# Patient Record
Sex: Female | Born: 1960 | Race: Black or African American | Hispanic: No | Smoking: Never smoker
Health system: Southern US, Community
[De-identification: ages and names within clinical notes are randomized; demographics above are authoritative.]

## PROBLEM LIST (undated history)

## (undated) DIAGNOSIS — I10 Essential (primary) hypertension: Secondary | ICD-10-CM

## (undated) DIAGNOSIS — D509 Iron deficiency anemia, unspecified: Secondary | ICD-10-CM

## (undated) DIAGNOSIS — R251 Tremor, unspecified: Secondary | ICD-10-CM

## (undated) DIAGNOSIS — Z8719 Personal history of other diseases of the digestive system: Secondary | ICD-10-CM

## (undated) DIAGNOSIS — L0291 Cutaneous abscess, unspecified: Secondary | ICD-10-CM

## (undated) DIAGNOSIS — R079 Chest pain, unspecified: Secondary | ICD-10-CM

## (undated) DIAGNOSIS — D649 Anemia, unspecified: Secondary | ICD-10-CM

## (undated) DIAGNOSIS — G4700 Insomnia, unspecified: Secondary | ICD-10-CM

## (undated) DIAGNOSIS — I639 Cerebral infarction, unspecified: Secondary | ICD-10-CM

## (undated) DIAGNOSIS — E0590 Thyrotoxicosis, unspecified without thyrotoxic crisis or storm: Secondary | ICD-10-CM

## (undated) DIAGNOSIS — M1990 Unspecified osteoarthritis, unspecified site: Secondary | ICD-10-CM

## (undated) DIAGNOSIS — F32A Depression, unspecified: Secondary | ICD-10-CM

## (undated) DIAGNOSIS — E119 Type 2 diabetes mellitus without complications: Secondary | ICD-10-CM

## (undated) DIAGNOSIS — G4730 Sleep apnea, unspecified: Secondary | ICD-10-CM

## (undated) DIAGNOSIS — J189 Pneumonia, unspecified organism: Secondary | ICD-10-CM

## (undated) DIAGNOSIS — K219 Gastro-esophageal reflux disease without esophagitis: Secondary | ICD-10-CM

## (undated) DIAGNOSIS — K259 Gastric ulcer, unspecified as acute or chronic, without hemorrhage or perforation: Secondary | ICD-10-CM

## (undated) DIAGNOSIS — K5900 Constipation, unspecified: Secondary | ICD-10-CM

## (undated) DIAGNOSIS — R1013 Epigastric pain: Secondary | ICD-10-CM

## (undated) DIAGNOSIS — Z87442 Personal history of urinary calculi: Secondary | ICD-10-CM

## (undated) DIAGNOSIS — E569 Vitamin deficiency, unspecified: Secondary | ICD-10-CM

## (undated) DIAGNOSIS — E0500 Thyrotoxicosis with diffuse goiter without thyrotoxic crisis or storm: Secondary | ICD-10-CM

## (undated) DIAGNOSIS — Z930 Tracheostomy status: Secondary | ICD-10-CM

## (undated) DIAGNOSIS — R58 Hemorrhage, not elsewhere classified: Secondary | ICD-10-CM

## (undated) DIAGNOSIS — N189 Chronic kidney disease, unspecified: Secondary | ICD-10-CM

## (undated) DIAGNOSIS — T7840XA Allergy, unspecified, initial encounter: Secondary | ICD-10-CM

## (undated) DIAGNOSIS — K253 Acute gastric ulcer without hemorrhage or perforation: Secondary | ICD-10-CM

## (undated) DIAGNOSIS — E785 Hyperlipidemia, unspecified: Secondary | ICD-10-CM

## (undated) DIAGNOSIS — G8929 Other chronic pain: Secondary | ICD-10-CM

## (undated) DIAGNOSIS — R531 Weakness: Secondary | ICD-10-CM

## (undated) DIAGNOSIS — R300 Dysuria: Secondary | ICD-10-CM

## (undated) DIAGNOSIS — G629 Polyneuropathy, unspecified: Secondary | ICD-10-CM

## (undated) DIAGNOSIS — M797 Fibromyalgia: Secondary | ICD-10-CM

## (undated) DIAGNOSIS — G4733 Obstructive sleep apnea (adult) (pediatric): Secondary | ICD-10-CM

## (undated) DIAGNOSIS — F329 Major depressive disorder, single episode, unspecified: Secondary | ICD-10-CM

## (undated) DIAGNOSIS — Z5189 Encounter for other specified aftercare: Secondary | ICD-10-CM

## (undated) DIAGNOSIS — N179 Acute kidney failure, unspecified: Secondary | ICD-10-CM

## (undated) HISTORY — PX: OOPHORECTOMY: SHX86

## (undated) HISTORY — DX: Fibromyalgia: M79.7

## (undated) HISTORY — DX: Tremor, unspecified: R25.1

## (undated) HISTORY — DX: Hyperlipidemia, unspecified: E78.5

## (undated) HISTORY — DX: Vitamin deficiency, unspecified: E56.9

## (undated) HISTORY — DX: Epigastric pain: R10.13

## (undated) HISTORY — DX: Polyneuropathy, unspecified: G62.9

## (undated) HISTORY — DX: Cerebral infarction, unspecified: I63.9

## (undated) HISTORY — PX: KNEE ARTHROSCOPY: SHX127

## (undated) HISTORY — DX: Allergy, unspecified, initial encounter: T78.40XA

## (undated) HISTORY — DX: Cutaneous abscess, unspecified: L02.91

## (undated) HISTORY — DX: Encounter for other specified aftercare: Z51.89

## (undated) HISTORY — DX: Tracheostomy status: Z93.0

## (undated) HISTORY — DX: Hemorrhage, not elsewhere classified: R58

## (undated) HISTORY — DX: Chest pain, unspecified: R07.9

## (undated) HISTORY — DX: Iron deficiency anemia, unspecified: D50.9

## (undated) HISTORY — DX: Insomnia, unspecified: G47.00

## (undated) HISTORY — DX: Personal history of other diseases of the digestive system: Z87.19

## (undated) HISTORY — DX: Gastric ulcer, unspecified as acute or chronic, without hemorrhage or perforation: K25.9

## (undated) HISTORY — PX: TUBAL LIGATION: SHX77

---
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
## 1995-07-13 HISTORY — PX: KNEE ARTHROPLASTY: SHX992

## 2010-07-12 DIAGNOSIS — Z5189 Encounter for other specified aftercare: Secondary | ICD-10-CM

## 2010-07-12 HISTORY — DX: Encounter for other specified aftercare: Z51.89

## 2011-01-25 ENCOUNTER — Emergency Department (HOSPITAL_COMMUNITY)
Admission: EM | Admit: 2011-01-25 | Discharge: 2011-01-26 | Disposition: A | Discharge status: Home / Self Care | Payer: Self-pay | Attending: Emergency Medicine | Admitting: Emergency Medicine

## 2011-01-25 ENCOUNTER — Emergency Department (HOSPITAL_COMMUNITY): Payer: Self-pay

## 2011-01-25 DIAGNOSIS — R55 Syncope and collapse: Secondary | ICD-10-CM | POA: Insufficient documentation

## 2011-01-25 DIAGNOSIS — N289 Disorder of kidney and ureter, unspecified: Secondary | ICD-10-CM | POA: Insufficient documentation

## 2011-01-25 DIAGNOSIS — R42 Dizziness and giddiness: Secondary | ICD-10-CM | POA: Insufficient documentation

## 2011-01-25 DIAGNOSIS — I10 Essential (primary) hypertension: Secondary | ICD-10-CM | POA: Insufficient documentation

## 2011-01-25 DIAGNOSIS — R0602 Shortness of breath: Secondary | ICD-10-CM | POA: Insufficient documentation

## 2011-01-25 LAB — DIFFERENTIAL
Eosinophils Absolute: 0.2 10*3/uL (ref 0.0–0.7)
Eosinophils Relative: 3 % (ref 0–5)
Lymphocytes Relative: 22 % (ref 12–46)
Lymphs Abs: 1.9 10*3/uL (ref 0.7–4.0)
Monocytes Relative: 8 % (ref 3–12)
Neutrophils Relative %: 66 % (ref 43–77)

## 2011-01-25 LAB — CBC
HCT: 39.6 % (ref 36.0–46.0)
MCH: 28.9 pg (ref 26.0–34.0)
MCV: 88.6 fL (ref 78.0–100.0)
Platelets: 357 10*3/uL (ref 150–400)
RBC: 4.47 MIL/uL (ref 3.87–5.11)

## 2011-01-25 LAB — TROPONIN I: Troponin I: <0.3 ng/mL (ref <0.30)

## 2011-01-26 LAB — BASIC METABOLIC PANEL WITH GFR
BUN: 24 mg/dL — ABNORMAL HIGH (ref 6–23)
CO2: 25 meq/L (ref 19–32)
Calcium: 9.8 mg/dL (ref 8.4–10.5)
Chloride: 102 meq/L (ref 96–112)
Creatinine, Ser: 1.26 mg/dL — ABNORMAL HIGH (ref 0.50–1.10)
GFR calc Af Amer: 55 mL/min — ABNORMAL LOW
GFR calc non Af Amer: 45 mL/min — ABNORMAL LOW
Glucose, Bld: 103 mg/dL — ABNORMAL HIGH (ref 70–99)
Potassium: 3.2 meq/L — ABNORMAL LOW (ref 3.5–5.1)
Sodium: 140 meq/L (ref 135–145)

## 2011-01-26 LAB — URINALYSIS, ROUTINE W REFLEX MICROSCOPIC
Glucose, UA: NEGATIVE mg/dL
Ketones, ur: NEGATIVE mg/dL
Leukocytes, UA: NEGATIVE
Nitrite: NEGATIVE
Protein, ur: NEGATIVE mg/dL
Specific Gravity, Urine: 1.025 (ref 1.005–1.030)
Urobilinogen, UA: 0.2 mg/dL (ref 0.0–1.0)
pH: 5.5 (ref 5.0–8.0)

## 2011-01-26 LAB — URINE MICROSCOPIC-ADD ON

## 2011-01-26 LAB — D-DIMER, QUANTITATIVE: D-Dimer, Quant: <0.22 ug/mL-FEU (ref 0.00–0.48)

## 2011-01-27 LAB — URINE CULTURE

## 2011-11-18 ENCOUNTER — Other Ambulatory Visit: Payer: Self-pay | Admitting: Family Medicine

## 2011-11-18 DIAGNOSIS — Z1231 Encounter for screening mammogram for malignant neoplasm of breast: Secondary | ICD-10-CM

## 2011-12-01 ENCOUNTER — Ambulatory Visit: Payer: Self-pay

## 2011-12-07 ENCOUNTER — Ambulatory Visit
Admission: RE | Admit: 2011-12-07 | Discharge: 2011-12-07 | Disposition: A | Discharge status: Home / Self Care | Payer: Medicaid Other | Source: Ambulatory Visit | Attending: Family Medicine | Admitting: Family Medicine

## 2011-12-07 DIAGNOSIS — Z1231 Encounter for screening mammogram for malignant neoplasm of breast: Secondary | ICD-10-CM

## 2013-01-14 ENCOUNTER — Emergency Department (HOSPITAL_COMMUNITY): Payer: Medicaid Other

## 2013-01-14 ENCOUNTER — Inpatient Hospital Stay (HOSPITAL_COMMUNITY)
Admission: EM | Admit: 2013-01-14 | Discharge: 2013-01-16 | DRG: 065 | Disposition: A | Discharge status: Home / Self Care | Payer: Medicaid Other | Attending: Family Medicine | Admitting: Family Medicine

## 2013-01-14 ENCOUNTER — Inpatient Hospital Stay (HOSPITAL_COMMUNITY): Payer: Medicaid Other

## 2013-01-14 ENCOUNTER — Encounter (HOSPITAL_COMMUNITY): Payer: Self-pay | Admitting: Emergency Medicine

## 2013-01-14 DIAGNOSIS — R4701 Aphasia: Secondary | ICD-10-CM

## 2013-01-14 DIAGNOSIS — G459 Transient cerebral ischemic attack, unspecified: Secondary | ICD-10-CM

## 2013-01-14 DIAGNOSIS — I152 Hypertension secondary to endocrine disorders: Secondary | ICD-10-CM | POA: Diagnosis present

## 2013-01-14 DIAGNOSIS — Z6841 Body Mass Index (BMI) 40.0 and over, adult: Secondary | ICD-10-CM

## 2013-01-14 DIAGNOSIS — I639 Cerebral infarction, unspecified: Secondary | ICD-10-CM | POA: Diagnosis present

## 2013-01-14 DIAGNOSIS — I6350 Cerebral infarction due to unspecified occlusion or stenosis of unspecified cerebral artery: Principal | ICD-10-CM | POA: Diagnosis present

## 2013-01-14 DIAGNOSIS — Z7982 Long term (current) use of aspirin: Secondary | ICD-10-CM

## 2013-01-14 DIAGNOSIS — D649 Anemia, unspecified: Secondary | ICD-10-CM | POA: Diagnosis present

## 2013-01-14 DIAGNOSIS — I6529 Occlusion and stenosis of unspecified carotid artery: Secondary | ICD-10-CM | POA: Diagnosis present

## 2013-01-14 DIAGNOSIS — R269 Unspecified abnormalities of gait and mobility: Secondary | ICD-10-CM

## 2013-01-14 DIAGNOSIS — R279 Unspecified lack of coordination: Secondary | ICD-10-CM | POA: Diagnosis present

## 2013-01-14 DIAGNOSIS — I69923 Fluency disorder following unspecified cerebrovascular disease: Secondary | ICD-10-CM

## 2013-01-14 DIAGNOSIS — R29818 Other symptoms and signs involving the nervous system: Secondary | ICD-10-CM

## 2013-01-14 DIAGNOSIS — R2990 Unspecified symptoms and signs involving the nervous system: Secondary | ICD-10-CM

## 2013-01-14 DIAGNOSIS — J45909 Unspecified asthma, uncomplicated: Secondary | ICD-10-CM | POA: Diagnosis present

## 2013-01-14 DIAGNOSIS — R29898 Other symptoms and signs involving the musculoskeletal system: Secondary | ICD-10-CM | POA: Diagnosis present

## 2013-01-14 DIAGNOSIS — I10 Essential (primary) hypertension: Secondary | ICD-10-CM | POA: Diagnosis present

## 2013-01-14 DIAGNOSIS — F449 Dissociative and conversion disorder, unspecified: Secondary | ICD-10-CM | POA: Diagnosis present

## 2013-01-14 DIAGNOSIS — IMO0002 Reserved for concepts with insufficient information to code with codable children: Secondary | ICD-10-CM

## 2013-01-14 DIAGNOSIS — G8190 Hemiplegia, unspecified affecting unspecified side: Secondary | ICD-10-CM | POA: Diagnosis present

## 2013-01-14 HISTORY — DX: Essential (primary) hypertension: I10

## 2013-01-14 HISTORY — DX: Cerebral infarction, unspecified: I63.9

## 2013-01-14 LAB — POCT I-STAT, CHEM 8
Chloride: 100 mEq/L (ref 96–112)
Creatinine, Ser: 0.9 mg/dL (ref 0.50–1.10)
Glucose, Bld: 145 mg/dL — ABNORMAL HIGH (ref 70–99)
HCT: 42 % (ref 36.0–46.0)
Potassium: 3.5 mEq/L (ref 3.5–5.1)

## 2013-01-14 LAB — COMPREHENSIVE METABOLIC PANEL
CO2: 30 mEq/L (ref 19–32)
Calcium: 9.3 mg/dL (ref 8.4–10.5)
Creatinine, Ser: 0.84 mg/dL (ref 0.50–1.10)
GFR calc Af Amer: >90 mL/min (ref >90)
GFR calc non Af Amer: 79 mL/min — ABNORMAL LOW (ref >90)
Glucose, Bld: 142 mg/dL — ABNORMAL HIGH (ref 70–99)

## 2013-01-14 LAB — CBC
HCT: 39.6 % (ref 36.0–46.0)
HCT: 37 % (ref 36.0–46.0)
HCT: 38.7 % (ref 36.0–46.0)
Hemoglobin: 12.4 g/dL (ref 12.0–15.0)
MCH: 28.6 pg (ref 26.0–34.0)
MCH: 28.5 pg (ref 26.0–34.0)
MCHC: 33.5 g/dL (ref 30.0–36.0)
MCV: 86.5 fL (ref 78.0–100.0)
MCV: 85.5 fL (ref 78.0–100.0)
MCV: 86.2 fL (ref 78.0–100.0)
RBC: 4.49 MIL/uL (ref 3.87–5.11)
RDW: 14.1 % (ref 11.5–15.5)
WBC: 9.1 10*3/uL (ref 4.0–10.5)
WBC: 7.6 10*3/uL (ref 4.0–10.5)

## 2013-01-14 LAB — RAPID URINE DRUG SCREEN, HOSP PERFORMED
Cocaine: NOT DETECTED
Opiates: POSITIVE — AB

## 2013-01-14 LAB — LIPID PANEL
Cholesterol: 224 mg/dL — ABNORMAL HIGH (ref 0–200)
Triglycerides: 133 mg/dL (ref <150)

## 2013-01-14 LAB — URINALYSIS, ROUTINE W REFLEX MICROSCOPIC
Bilirubin Urine: NEGATIVE
Specific Gravity, Urine: 1.011 (ref 1.005–1.030)
pH: 6.5 (ref 5.0–8.0)

## 2013-01-14 LAB — DIFFERENTIAL
Basophils Absolute: 0 10*3/uL (ref 0.0–0.1)
Eosinophils Relative: 1 % (ref 0–5)
Lymphocytes Relative: 21 % (ref 12–46)
Lymphs Abs: 1.9 10*3/uL (ref 0.7–4.0)
Monocytes Absolute: 0.5 10*3/uL (ref 0.1–1.0)

## 2013-01-14 LAB — TROPONIN I
Troponin I: <0.3 ng/mL (ref <0.30)
Troponin I: <0.3 ng/mL (ref <0.30)

## 2013-01-14 LAB — GLUCOSE, CAPILLARY
Glucose-Capillary: 129 mg/dL — ABNORMAL HIGH (ref 70–99)
Glucose-Capillary: 126 mg/dL — ABNORMAL HIGH (ref 70–99)
Glucose-Capillary: 119 mg/dL — ABNORMAL HIGH (ref 70–99)

## 2013-01-14 LAB — HEMOGLOBIN A1C
Hgb A1c MFr Bld: 5.9 % — ABNORMAL HIGH (ref <5.7)
Mean Plasma Glucose: 123 mg/dL — ABNORMAL HIGH (ref <117)

## 2013-01-14 LAB — URINE MICROSCOPIC-ADD ON

## 2013-01-14 LAB — CREATININE, SERUM: GFR calc non Af Amer: 77 mL/min — ABNORMAL LOW (ref >90)

## 2013-01-14 MED ORDER — SENNOSIDES-DOCUSATE SODIUM 8.6-50 MG PO TABS
1.0000 | ORAL_TABLET | Freq: Every evening | ORAL | Status: DC | PRN
  Administered 2013-01-14: 1 via ORAL
  Filled 2013-01-14: qty 1

## 2013-01-14 MED ORDER — ACETAMINOPHEN 325 MG PO TABS
650.0000 mg | ORAL_TABLET | ORAL | Status: DC | PRN
  Administered 2013-01-14 – 2013-01-16 (×5): 650 mg via ORAL
  Filled 2013-01-14 (×5): qty 2

## 2013-01-14 MED ORDER — ATORVASTATIN CALCIUM 20 MG PO TABS
20.0000 mg | ORAL_TABLET | Freq: Every day | ORAL | Status: DC
  Administered 2013-01-14 – 2013-01-16 (×3): 20 mg via ORAL
  Filled 2013-01-14 (×3): qty 1

## 2013-01-14 MED ORDER — SODIUM CHLORIDE 0.9 % IV SOLN
INTRAVENOUS | Status: DC
  Administered 2013-01-14 (×2): via INTRAVENOUS

## 2013-01-14 MED ORDER — ASPIRIN 300 MG RE SUPP
300.0000 mg | Freq: Every day | RECTAL | Status: DC
  Filled 2013-01-14 (×3): qty 1

## 2013-01-14 MED ORDER — GADOBENATE DIMEGLUMINE 529 MG/ML IV SOLN
20.0000 mL | Freq: Once | INTRAVENOUS | Status: AC | PRN
  Administered 2013-01-14: 20 mL via INTRAVENOUS

## 2013-01-14 MED ORDER — HEPARIN SODIUM (PORCINE) 5000 UNIT/ML IJ SOLN
5000.0000 [IU] | Freq: Three times a day (TID) | INTRAMUSCULAR | Status: DC
  Administered 2013-01-14 – 2013-01-16 (×7): 5000 [IU] via SUBCUTANEOUS
  Filled 2013-01-14 (×10): qty 1

## 2013-01-14 MED ORDER — ACETAMINOPHEN 650 MG RE SUPP: 650.0000 mg | RECTAL | Status: DC | PRN

## 2013-01-14 MED ORDER — ASPIRIN 325 MG PO TABS
325.0000 mg | ORAL_TABLET | Freq: Every day | ORAL | Status: DC
  Administered 2013-01-14 – 2013-01-16 (×3): 325 mg via ORAL
  Filled 2013-01-14 (×3): qty 1

## 2013-01-14 MED ORDER — HYDRALAZINE HCL 20 MG/ML IJ SOLN: 5.0000 mg | INTRAMUSCULAR | Status: DC | PRN

## 2013-01-14 NOTE — ED Notes (Signed)
Pt reports falling out of bed 4 times today.

## 2013-01-14 NOTE — ED Provider Notes (Signed)
History    CSN: [id number] Arrival date & time 01/14/13  0059  First MD Initiated Contact with Patient 01/14/13 0105     Chief Complaint  Patient presents with  . Aphasia   (Consider location/radiation/quality/duration/timing/severity/associated sxs/prior Treatment) HPI Comments: The patient is a 52 year old female who admits to a history of hypertension and a recent visit to her family doctor because of right ankle pain. She was started on hydrocodone and shortly after taking this medication develop difficulty speaking, weakness on the right side of her body and has had several falls. This seems to be intermittent, it comes and goes but has been present for a good portion of today. She had fallen out of bed, had to crawl to the bathroom stating that she felt like her body was not connected to her brain and she was unable to do the actions that she wanted to perform. She denies fevers or chills, coughing or shortness of breath, abdominal pain, back pain, nausea or vomiting. She has not had a hydrocodone and greater than 24 hours  The history is provided by the patient and the EMS personnel.   Past Medical History  Diagnosis Date  . Hypertension    History reviewed. No pertinent past surgical history. No family history on file. History  Substance Use Topics  . Smoking status: Not on file  . Smokeless tobacco: Not on file  . Alcohol Use: Not on file   OB History   Grav Para Term Preterm Abortions TAB SAB Ect Mult Living                 Review of Systems  Neurological: Positive for headaches.  All other systems reviewed and are negative.    Allergies  Morphine and related  Home Medications  No current outpatient prescriptions on file. BP 120/84  Pulse 98  Temp(Src) 98 F (36.7 C) (Oral)  Resp 20  SpO2 97% Physical Exam  Nursing note and vitals reviewed. Constitutional: She appears well-developed and well-nourished. No distress.  HENT:  Head: Normocephalic and  atraumatic.  Mouth/Throat: Oropharynx is clear and moist. No oropharyngeal exudate.  Eyes: Conjunctivae and EOM are normal. Pupils are equal, round, and reactive to light. Right eye exhibits no discharge. Left eye exhibits no discharge. No scleral icterus.  Neck: Normal range of motion. Neck supple. No JVD present. No thyromegaly present.  Cardiovascular: Normal rate, regular rhythm, normal heart sounds and intact distal pulses.  Exam reveals no gallop and no friction rub.   No murmur heard. No JVD, no carotid bruits  Pulmonary/Chest: Effort normal and breath sounds normal. No respiratory distress. She has no wheezes. She has no rales.  Abdominal: Soft. Bowel sounds are normal. She exhibits no distension and no mass. There is no tenderness.  Musculoskeletal: Normal range of motion. She exhibits no edema and no tenderness.  Lymphadenopathy:    She has no cervical adenopathy.  Neurological: She is alert.  Some discoordination of the upper extremities, extreme difficulty sitting up in bed, speech is stuttering and has some mild expressive aphasia which seems to come and go throughout the exam, cranial nerves III through XII are intact when tested, patient has a slight left facial droop when talking but does not appear when uses her facial muscles to command. She has slight weakness of the right upper and right lower extremities, slight sensory deficit to the right lower extremity but not the right upper extremity to light touch. Normal reflexes at the patellar tendons bilaterally, memory  is intact, alert and oriented x3  Skin: Skin is warm and dry. No rash noted. No erythema.  Psychiatric: She has a normal mood and affect. Her behavior is normal.    ED Course  Procedures (including critical care time) Labs Reviewed  COMPREHENSIVE METABOLIC PANEL - Abnormal; Notable for the following:    Glucose, Bld 142 (*)    GFR calc non Af Amer 79 (*)    All other components within normal limits  GLUCOSE,  CAPILLARY - Abnormal; Notable for the following:    Glucose-Capillary 129 (*)    All other components within normal limits  POCT I-STAT, CHEM 8 - Abnormal; Notable for the following:    Glucose, Bld 145 (*)    All other components within normal limits  ETHANOL  PROTIME-INR  APTT  CBC  DIFFERENTIAL  TROPONIN I  URINE RAPID DRUG SCREEN (HOSP PERFORMED)  URINALYSIS, ROUTINE W REFLEX MICROSCOPIC  POCT I-STAT TROPONIN I   Ct Head Wo Contrast  01/14/2013   *RADIOLOGY REPORT*  Clinical Data: Left sided headache, dizziness, blurred vision, aphasia.  CT HEAD WITHOUT CONTRAST  Technique:  Contiguous axial images were obtained from the base of the skull through the vertex without contrast.  Comparison: None.  Findings: There is a vague focal low attenuation change in the subcortical white matter of the left posterior parietal/occipital region.  This could represent small vessel ischemia versus early changes of acute infarct.  There is no mass effect or midline shift.  No ventricular dilatation.  No acute intracranial hemorrhage.  Gray-white matter junctions are distinct.  Basal cisterns are not effaced.  No abnormal extra-axial fluid collections.  No depressed skull fractures.  Visualized paranasal sinuses and mastoid air cells are not opacified.  IMPRESSION: Vague low attenuation change in the subcortical white matter of the left posterior parietal/occipital region may represent early changes of acute infarct versus small vessel ischemic change.  No significant mass effect.  No acute intracranial hemorrhage.   Original Report Authenticated By: Burman Nieves, M.D.   1. Aphasia   2.  possible stroke  MDM  The patient has focal neurologic deficits, I do not know that these are necessarily related to the hydrocodone especially since she has not had this medication greater than 24 hours. She does have generalized body pain and is not more specific about this on history but does note that she has focal  right-sided weakness and difficulty speaking intermittently for the last 2-3 days. At this time her symptoms have been present waxing and waning for longer than 12 hours, she is not accurate in describing the most recent onset of these symptoms thus at this time I cannot definitively say that she is within a stroke window and a code stroke will not be activated. Stroke workup will begin.  ED ECG REPORT  I personally interpreted this EKG   Date: 01/14/2013   Rate: 96  Rhythm: normal sinus rhythm  QRS Axis: normal  Intervals: QT prolonged  ST/T Wave abnormalities: nonspecific T wave changes  Conduction Disutrbances:none  Narrative Interpretation: Left ventricular hypertrophy  Old EKG Reviewed: Compared with 01/25/2011, no significant change  The care was discussed with the resident physician on-call for family practice as the patient is unassigned. CT scan shows possible stroke, this might be consistent with the patient's symptoms, they will admit to the hospital.  Vida Roller, MD 01/14/13 [phone]

## 2013-01-14 NOTE — H&P (Signed)
Family Medicine Teaching Service Attending Note  I interviewed and examined patient Yesenia [name] and reviewed their tests and x-rays.  I discussed with Dr. Claiborne Billings and reviewed their note for today.  I agree with their assessment and plan.     Additionally  Sleepy but easily arousable Oriented x 3 Complains of dizziness with sitting - room spinning Complains of right sided weakness and clumsiness for several weeks Had small stroke years ago attributed to celebrex CN 2-7 intact Able to swallow water but feels it sticks  Str 4/5 on right upper and lower 5/5 on left PERRL EOMI Finger to nose intact bilateral left slower than right   History and neuro exam findings not consistent with CT findings.- Obtain MRI and Neuro consult CVA work up [employer] study

## 2013-01-14 NOTE — H&P (Signed)
Family Medicine Teaching [facility] Admission History and Physical Service Pager: [phone]  Patient name: Yesenia [name] Medical record number: [id number] Date of birth: [birth year] Age: 52 y.o. Gender: female  Primary Care Provider: Egbert Garibaldi, NP Consultants: None Code Status: Full  Chief Complaint: Dizziness and ataxia  Assessment and Plan: Yesenia [name] is a 52 y.o. year old female presenting with acute ischemic stroke . PMH is significant for HTN and anemia. Currently on telemetry, with a CT positive for evidence of early acute ischemic infarct vs small vessel ischemic change  # Acute ischemic infarct - Right sided weakness in arms and legs. CN11 deficit on right. No other cranial nerve defecits. Positive findings on CT head (7/6) suggestive of acute ischemic injury to parietal/occiptal region. History of HTN and frequent episodes of dizziness suggesting possible uncontrolled status. Recently seen at PCP (Dr. Aileen [name]) with medication changes, patient uncertain of doses and when her last dose was taken. Possibly drop BP too low with medication changes or medication misuse. UDS positive for benzodiazepines, patient did not admit to this medication.  -currently NPO until swallow evaluation complete -monitor signs and symptoms for changes in neurological function -monitor CBGs for glucose control - Patient started on ASA 325 mg daily and Bowel stimulant - NS 125 ml/hr while NPO - Foley cath placed for safety and bedrest status.  [ ]  Carotid Doppler ordered; Considering MRA/MRI in the morning.  [ ]  Consult Neurology in the morning.  [ ]  Risk stratification labs ordered [ ]  Repeat CBC and CMET this afternoon   # HTN - BPs have been stable in ED -hold home BP meds for now.  - Monitor closely; hydralazine PRN orders for BP > 200/110; allowing for permissive HTN during acute phase stroke.   # Anemia - history of anemia with frequent episodes of dizziness. Patient reports  transfusion d/t to anemia a few years ago. She does not take any medications or supplementation for anemia and her H/H was stable on admission.  -monitor CBC  FEN/GI: NPO Prophylaxis: Heparin subq  Disposition: Admit to telemetry  History of Present Illness: Yesenia [name] is a 52 y.o. year old female with a history of hypertension and chronic fatigue with frequent episodes of dizziness, that presented to the ED with right sided weakness that developed on Thursday. Yesterday, she developed trouble walking and fell down four times. She admits to hitting her head once. Before she fell, she felt like her balance is "off" and her legs "weren't there" She has a long history of feeling dizzy when walking into grocery stores and having to use a grocery cart to catch herself as she would feel the area spinning. She does not have a history of stuttering, however,  Yesenia [name] stated that her daughter mentioned she had developed a stutter while she was on the phone on Thursday. Yesenia [name] was not aware of a stutter prior. She has been confused about the past few days and is getting her days mixed up. She came to the hospital by ambulance from home after her boyfriend came over and saw she couldn't walk to the bathroom. At the time, she stated her legs felt weak and unattached, with the right leg worse than the left leg. Her right arm feels heavy versus her left hand. She has been drinking a lot of water for the past week and feels thirsty a lot. She has had no headache, diarrhea or constipation. She has had a recent changes in vision, she has had  SOB (3 months "Bronchitis"), chest pain and polyuria, no dysuria, with normal voids. She reports her chest pain occured Thursday or Friday, did not have radiation,  and went away on its own. She last ate on Thursday that she recalls, but seems confused on this matter. She has a Daughter Proofreader) and Son [place]), but neither have a car. She reports Dr. Aileen [name] gave her a  hearing screen but she only scored a "40."  She reports a FH of  no stroke, but positive for HTN and  brother died of leukemia, sister of cancer. She is uncertain of her father's history, he is unknown to her. She denies smoking history, and admits to an occasional occasional drink. Hospitalized in 2007 because she kept passing out said due to HTN and anemia. LMP 7 years ago. Yesenia [name] moved to [place] from [place] in 2012.  Review Of Systems: Per HPI with the following additions: Otherwise 12 point review of systems was performed and was unremarkable.  There are no active problems to display for this patient.  Past Medical History: Past Medical History  Diagnosis Date  . Hypertension   Hospitalizations: 2007 - Passing out   Past Surgical History: Past Surgical History  Procedure Laterality Date  . Leg surgery Right 2007    Surgery x3   Social History: History  Substance Use Topics  . Smoking status: Not on file  . Smokeless tobacco: Not on file  . Alcohol Use: Not on file   Additional social history: She is originally from [facility] and moved to [place] in 2012. She currently lives alone.  Please also refer to relevant sections of EMR.  Family History: Family History  Problem Relation Age of Onset  . Leukemia Brother    Allergies and Medications: Allergies  Allergen Reactions  . Morphine And Related    Home Medications: Hydrochlorthiazide, Amlodipine, Vicodin (Uncertian of medications d/t poor historian and her boyfriend took her medications home prior to staff being able to record them). Objective: BP 143/88  Pulse 82  Temp(Src) 98 F (36.7 C) (Oral)  Resp 14  SpO2 100% Exam: General: Obese, laying in bed, in no acute distress HEENT: Head is atraumatic, no lacerations visualized and no abnormalities palpated. TMs normal, pupils equal, round and reactive, no icterus, injections, or drainage. EOMi. Moist mucous membranes. Nose is patent without deformity or  drainage. Throat is without erythema or exudates. Cardiovascular: RRR, no murmur, rub or gallop,pulses equal bilaterally UE/LE Respiratory: clear to auscultation bilaterally, no wheeze, rhonchi or rales Abdomen: Soft, nontender. Bowel sounds present. Obese Extremities: no deformities Skin: Old scar from lower mandible to upper right side of chest. Multiple surgical scars on right leg Neuro: Alert, Oriented to person and place. Confused on day and date (july 4th).  CN 2-10,12 intact. CN11, pt not able to perform shoulder shrug on right compared to the left. Grip strength 5 on left compared to  2 on right. Arm pull and pull strength, 5 on left and 3 on right. No pronator drift. Hip extension 5 on left, 1 on right. Dorsiflexion and plantarflexion 5 on left, 4 on right.Patient unable to stand on her own, with great assistance x3 people, was able to get her standing with poor balance. Unable to perform gait assessment.   Labs and Imaging: CBC BMET   Recent Labs Lab 01/14/13 0132 01/14/13 0136  WBC 9.1  --   HGB 13.1 14.3  HCT 39.6 42.0  PLT 344  --  Recent Labs Lab 01/14/13 0132 01/14/13 0136  NA 138 141  K 3.5 3.5  CL 99 100  CO2 30  --   BUN 10 9  CREATININE 0.84 0.90  GLUCOSE 142* 145*  CALCIUM 9.3  --      CT Head w/o contrast  IMPRESSION:  Vague low attenuation change in the subcortical white matter of the  left posterior parietal/occipital region may represent early  changes of acute infarct versus small vessel ischemic change. No  significant mass effect. No acute intracranial hemorrhage.   Jacquelin Hawking, MD 01/14/2013, 4:35 AM PGY-1, [facility] FPTS Intern pager: [phone], text pages welcome  I have read and made changes where necessary, in blue, to Interns original note.  Felix Pacini, DO PGY-2

## 2013-01-14 NOTE — Evaluation (Signed)
Clinical/Bedside Swallow Evaluation Patient Details  Name: Yesenia [name] MRN: [id number] Date of Birth: 1960-09-13  Today's Date: 01/14/2013 Time: 7829-5621 SLP Time Calculation (min): 20 min  Past Medical History:  Past Medical History  Diagnosis Date  . Hypertension    Past Surgical History:  Past Surgical History  Procedure Laterality Date  . Leg surgery Right 2007    Surgery x3   HPI:  52 y.o. female with gradually progressing right hemiparesis associated with decreased sensation over the past 2 months. Frequent dizziness and loss of balance with multiple recent falls. The patient was on no antithrombotic  medication prior to admission. No acute infarct by MRI. The etiology of the patient's symptoms are unclear at this time.    Assessment / Plan / Recommendation Clinical Impression  Pt presented with normal oropharyngeal swallow function; produced constant vegetative voicing throughout PO consumption; appeared uncomfortable but could not specify information.  No overt s/s of aspiration. Recommend resuming carb modified diet with thin liquids.  No SLP f/u for swallowing; will return next date for speech/language eval per orders.  (Pt with inconsistent dysarthria/dysfluency throughout session.)    Aspiration Risk  Mild    Diet Recommendation Regular;Thin liquid   Liquid Administration via: Cup;Straw Medication Administration: Whole meds with liquid Supervision: Patient able to self feed    Other  Recommendations Oral Care Recommendations: Oral care BID             SLP Swallow Goalsn./a     Swallow Study Prior Functional Status       General Date of Onset: 01/14/13   Type of Study: Bedside swallow evaluation Diet Prior to this Study: NPO Temperature Spikes Noted: No Respiratory Status: Room air Behavior/Cognition: Alert Oral Cavity - Dentition: Adequate natural dentition (states no dental work; lower teeth covered by [employer]) Self-Feeding Abilities: Able  to feed self Patient Positioning: Upright in bed Baseline Vocal Quality: Clear Volitional Cough: Strong Volitional Swallow: Able to elicit    Oral/Motor/Sensory Function Overall Oral Motor/Sensory Function: Other (comment) (Mild asymmetry CN VII lower right)   Ice Chips Ice chips: Within functional limits Presentation: Spoon   Thin Liquid Thin Liquid: Within functional limits Presentation: Cup;Straw    Nectar Thick Nectar Thick Liquid: Not tested   Honey Thick Honey Thick Liquid: Not tested   Puree Puree: Within functional limits Presentation: Self Fed   Solid       Solid: Within functional limits Presentation: Self Fed      Yesenia [name] L. Samson Frederic, [place] CCC/SLP Pager [phone]   Blenda Mounts Laurice 01/14/2013,4:12 PM

## 2013-01-14 NOTE — ED Notes (Signed)
Pt and family reports intermittent period of Slurred speech since Thursday. Patient report constant generalized pain x2 weeks. Some mild weakness in the right hand. Pt alert to voice at this time. Some lethargy assessed. Seen Thursday at PCP for leg pain, prescribed Norco. NAD at this time.

## 2013-01-14 NOTE — ED Notes (Signed)
CBG 105 

## 2013-01-14 NOTE — Consult Note (Signed)
Referring Physician: Dr. Deirdre Priest (family medicine teaching service)    Chief Complaint: Progressive right-sided weakness  HPI: Yesenia [name] is a 52 y.o. female who moved to [place] from [place] in 2012. The patient was admitted to [facility] today by the family medicine teaching service for evaluation of dizziness and ataxia. There was a question of an acute ischemic infarct. The patient reported to me today that she has had increasing right-sided weakness over the past 2 months. She says the symptoms started in her shoulder and progressed to her entire right arm and right leg. She is also noted decreased sensation on that side. Her daughter noted that the patient had been stuttering recently. This is not normal for the patient; although, she did tell me that she had a similar episode of stuttering while living in [place]. At that time she was admitted to the hospital and found to be severely anemic. She was transfused and apparently her symptoms resolved. She has a long history of dizziness and recently her balance has been very poor. Yesterday she fell 4 times and at one point she struck her head. She tells me that she was not on aspirin prior to admission. We are still waiting for a complete list of the patient's medications.  Date last known well: 2 months ago Time last known well: Unknown tPA Given: No TPA secondary to late presentation.  Past Medical History  Diagnosis Date  . Hypertension     Past Surgical History  Procedure Laterality Date  . Leg surgery Right 2007    Surgery x3    Family History  Problem Relation Age of Onset  . Leukemia Brother    Social History:  has no tobacco, alcohol, and drug history on file. The patient currently lives in [place]. She is divorced. She has 2 children. She is on disability.  Allergies:  Allergies  Allergen Reactions  . Morphine And Related     Medications:  Current Facility-Administered Medications  Medication  Dose Route Frequency Provider Last Rate Last Dose  . 0.9 %  sodium chloride infusion   Intravenous Continuous Renee A Kuneff, DO 150 mL/hr at 01/14/13 1049    . acetaminophen (TYLENOL) tablet 650 mg  650 mg Oral Q4H PRN Jacquelin Hawking, MD       Or  . acetaminophen (TYLENOL) suppository 650 mg  650 mg Rectal Q4H PRN Jacquelin Hawking, MD      . aspirin suppository 300 mg  300 mg Rectal Daily Jacquelin Hawking, MD       Or  . aspirin tablet 325 mg  325 mg Oral Daily Jacquelin Hawking, MD   325 mg at 01/14/13 1037  . heparin injection 5,000 Units  5,000 Units Subcutaneous Q8H Jacquelin Hawking, MD   5,000 Units at 01/14/13 1344  . hydrALAZINE (APRESOLINE) injection 5 mg  5 mg Intravenous Q4H PRN Jacquelin Hawking, MD      . senna-docusate (Senokot-S) tablet 1 tablet  1 tablet Oral QHS PRN Jacquelin Hawking, MD       Scheduled: . aspirin  300 mg Rectal Daily   Or  . aspirin  325 mg Oral Daily  . heparin  5,000 Units Subcutaneous Q8H    ROS: History obtained from the patient  General ROS: Positive for recent weight gain. Psychological ROS: Positive for depression Ophthalmic ROS: Positive for visual changes described as a film over her eyes ENT ROS: negative for - frequent dizziness and loss of balance Allergy and Immunology ROS: negative  for - hives or itchy/watery eyes Hematological and Lymphatic ROS: negative for - bleeding problems, bruising or swollen lymph nodes Endocrine ROS: negative for - galactorrhea, hair pattern changes, polydipsia/polyuria or temperature intolerance Respiratory ROS: negative for - positive for wheezing and shortness of breath Cardiovascular ROS: negative for - positive for intermittent chest pain Gastrointestinal ROS: negative for - positive for bilateral abdominal pain often related to movements. Genito-Urinary ROS: negative for - dysuria, hematuria, incontinence or urinary frequency/urgency Musculoskeletal ROS: negative for -positive for right knee and right ankle arthritis Neurological  ROS: as noted in HPI Dermatological ROS: Scarring of the right neck and face  Physical Examination: Blood pressure 118/84, pulse 94, temperature 98.2 F (36.8 C), temperature source Oral, resp. rate 20, height 5\' 6"  (1.676 m), weight 150.1 kg (330 lb 14.6 oz), SpO2 98.00%.   General - 52 year old morbidly obese female in no acute distress Heart - Regular rate and rhythm - no murmer Lungs - Clear to auscultation Abdomen - Soft - non tender Extremities - Distal pulses intact - trace edema Skin - Warm and dry   Neurologic Examination: Mental Status: Alert, oriented, thought content appropriate.  Frequent stuttering.  Able to follow 3 step commands without difficulty. Cranial Nerves: II: Discs not visualized; Visual fields grossly normal, pupils equal, round, reactive to light and accommodation III,IV, VI: ptosis not present, extra-ocular motions intact bilaterally V,VII: Mild right facial droop and decreased sensation on the right side of her face VIII: hearing normal bilaterally IX,X: gag reflex present XI: bilateral shoulder shrug XII: midline tongue extension Motor: Right : Upper extremity   4/5    Left:     Upper extremity   5/5  Lower extremity  3- 4/5     Lower extremity   5/5 Tone and bulk:normal tone throughout; no atrophy noted Sensory: Sensation decreased to light touch in the right upper and lower extremities as well as the face Deep Tendon Reflexes: 2+ and symmetric throughout except 1+ in the lower extremities Plantars: Right: downgoing   Left: downgoing Cerebellar: normal finger-to-nose, normal rapid alternating movements Gait: Deferred CV: pulses palpable throughout   Laboratory Studies:  Basic Metabolic Panel:  Recent Labs Lab 01/14/13 0132 01/14/13 0136 01/14/13 0714  NA 138 141  --   K 3.5 3.5  --   CL 99 100  --   CO2 30  --   --   GLUCOSE 142* 145*  --   BUN 10 9  --   CREATININE 0.84 0.90 0.86  CALCIUM 9.3  --   --     Liver Function  Tests:  Recent Labs Lab 01/14/13 0132  AST 21  ALT 20  ALKPHOS 111  BILITOT 0.3  PROT 8.0  ALBUMIN 3.8   No results found for this basename: LIPASE, AMYLASE,  in the last 168 hours No results found for this basename: AMMONIA,  in the last 168 hours  CBC:  Recent Labs Lab 01/14/13 0132 01/14/13 0136 01/14/13 0714 01/14/13 1250  WBC 9.1  --  8.1 7.6  NEUTROABS 6.6  --   --   --   HGB 13.1 14.3 12.4 12.8  HCT 39.6 42.0 37.0 38.7  MCV 86.5  --  85.5 86.2  PLT 344  --  300 307    Cardiac Enzymes:  Recent Labs Lab 01/14/13 0132 01/14/13 0713  TROPONINI <0.30 <0.30    BNP: No components found with this basename: POCBNP,   CBG:  Recent Labs Lab 01/14/13 0208 01/14/13 0944 01/14/13  1226  GLUCAP 129* 126* 119*    Microbiology: Results for orders placed during the hospital encounter of 01/25/11  URINE CULTURE     Status: None   Collection Time    01/26/11  2:00 AM      Result Value Range Status   Specimen Description URINE, CLEAN CATCH   Final   Special Requests NONE   Final   Culture  Setup Time [id number]   Final   Colony Count 25,000 COLONIES/ML   Final   Culture     Final   Value: Multiple bacterial morphotypes present, none predominant. Suggest appropriate recollection if clinically indicated.   Report Status 01/27/2011 FINAL   Final    Coagulation Studies:  Recent Labs  01/14/13 0132  LABPROT 14.2  INR 1.12    Urinalysis:  Recent Labs Lab 01/14/13 0404  COLORURINE YELLOW  LABSPEC 1.011  PHURINE 6.5  GLUCOSEU NEGATIVE  HGBUR TRACE*  BILIRUBINUR NEGATIVE  KETONESUR NEGATIVE  PROTEINUR NEGATIVE  UROBILINOGEN 0.2  NITRITE NEGATIVE  LEUKOCYTESUR NEGATIVE    Lipid Panel:    Component Value Date/Time   CHOL 224* 01/14/2013 0715   TRIG 133 01/14/2013 0715   HDL 56 01/14/2013 0715   CHOLHDL 4.0 01/14/2013 0715   VLDL 27 01/14/2013 0715   LDLCALC 141* 01/14/2013 0715    HgbA1C:  Lab Results  Component Value Date   HGBA1C 5.9* 01/14/2013     Urine Drug Screen:     Component Value Date/Time   LABOPIA POSITIVE* 01/14/2013 0404   COCAINSCRNUR NONE DETECTED 01/14/2013 0404   LABBENZ POSITIVE* 01/14/2013 0404   AMPHETMU NONE DETECTED 01/14/2013 0404   THCU NONE DETECTED 01/14/2013 0404   LABBARB NONE DETECTED 01/14/2013 0404    Alcohol Level:  Recent Labs Lab 01/14/13 0132  ETH <11    Other results: EKG: SR rate 96 BPM  Imaging:  Ct Head Wo Contrast 01/14/2013 Vague low attenuation change in the subcortical white matter of the left posterior parietal/occipital region may represent early changes of acute infarct versus small vessel ischemic change.  No significant mass effect.  No acute intracranial hemorrhage.      MRI of Head 01/14/2013 Premature white matter changes, consistent with chronic  microvascular ischemic change related hypertension. No acute  intracranial findings. No acute stroke or bleed.  MRA of Head 01/14/2013 Mild intracranial atherosclerotic changes as described. No  proximal flow reducing lesion.  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 Consider 6-  month MRA follow-up.   Assessment: 52 y.o. female with gradually progressing right hemiparesis associated with decreased sensation over the past 2 months. Frequent dizziness and loss of balance with multiple recent falls. No acute infarct by MRI. The etiology of the patient's symptoms are unclear at this time.   Stroke Risk Factors - hypertension hyperlipidemia obesity  Plan: 1. Consider statin therapy for hyperlipidemia 2. PT consult, OT consult 3. Echocardiogram - pending 4. Carotid dopplers - pending 5. Prophylactic therapy-Antiplatelet med: Aspirin - dose 325 mg daily   Delton See PA-C [facility] Pager [phone] 01/14/2013, 3:00 PM  I personally participate in this patient's evaluation and management including formulating the above clinical impression and treatment recommendations.  Venetia Maxon M.D. [facility] [phone]

## 2013-01-14 NOTE — ED Notes (Signed)
Pt unable to urinate at this time.  

## 2013-01-14 NOTE — Progress Notes (Signed)
Patient ID: Yesenia [name], female   DOB: 1961/01/09, 53 y.o.   MRN: [id number] Interim note: S: Patient is resting well. States her right side "feels swollen."  Patient has diet at bedside and no IV fluids started. Do not see documented swallow study.  O: BP 143/88  Pulse 82  Temp(Src) 98 F (36.7 C) (Oral)  Resp 14  SpO2 100% Gen: Sleepy. Feeling all right. No slur or stutter of speech this morning. Aroused easily to name.   A/P:  Will clarify diet and IV fluids order and implementation  Continue plan addressed in H&P Awaiting MRI/MRA and doppler study results Close Neuro Monitoring Neurology consult this morning

## 2013-01-15 DIAGNOSIS — R279 Unspecified lack of coordination: Secondary | ICD-10-CM

## 2013-01-15 DIAGNOSIS — I10 Essential (primary) hypertension: Secondary | ICD-10-CM

## 2013-01-15 DIAGNOSIS — D649 Anemia, unspecified: Secondary | ICD-10-CM

## 2013-01-15 DIAGNOSIS — R4701 Aphasia: Secondary | ICD-10-CM

## 2013-01-15 LAB — BASIC METABOLIC PANEL
BUN: 14 mg/dL (ref 6–23)
Calcium: 8.8 mg/dL (ref 8.4–10.5)
Creatinine, Ser: 0.96 mg/dL (ref 0.50–1.10)
GFR calc non Af Amer: 67 mL/min — ABNORMAL LOW (ref >90)
Glucose, Bld: 116 mg/dL — ABNORMAL HIGH (ref 70–99)

## 2013-01-15 LAB — GLUCOSE, CAPILLARY: Glucose-Capillary: 99 mg/dL (ref 70–99)

## 2013-01-15 LAB — CBC
Hemoglobin: 11.9 g/dL — ABNORMAL LOW (ref 12.0–15.0)
MCH: 28.5 pg (ref 26.0–34.0)
MCHC: 33.1 g/dL (ref 30.0–36.0)
Platelets: 316 10*3/uL (ref 150–400)
RDW: 14.2 % (ref 11.5–15.5)

## 2013-01-15 NOTE — Progress Notes (Signed)
FMTS Attending Admission Note: Yesenia Bethune,MD I  have seen and examined this patient, reviewed their chart. I have discussed this patient with the resident. I agree with the resident's findings, assessment and care plan.  She continues to endorse right sided extremity weakness and swelling. She denies any major pain,does feel dizzy and weak,denies any recent fall.  O/E: Obese,not in distress,comfortable in bed. Neuro: DTR ++, no sensory loss, power of right extremities about 3/5,normal left extremity power. Gait not assessed. Resp: Air entry equal B/L  CV; S1 S2 normal,no murmurs. Abd: Benign. Extremity: No edema.  A/P: 52 y/O Female with;  1. Right sided weakness. CT and MRI brain reviewed. Etiology unclear.MRI neg for ischemia or hemorrhage.Some element of anxiety.     I agree with neuro consult and PT     F/U ECHO and Carotid doppler.     Continue to monitor for improvement.  2. HTN: BP stable,continue current regimen and monitor BP.
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
## 2013-01-15 NOTE — Progress Notes (Signed)
UR COMPLETED  

## 2013-01-15 NOTE — Progress Notes (Signed)
VASCULAR LAB PRELIMINARY  PRELIMINARY  PRELIMINARY  PRELIMINARY  Carotid duplex completed.    Preliminary report:  .Technically difficult due to high bifurcations, body habitus of the neck , and respiratory interference - snoring. Bilateral:  Less than 39% ICA stenosis.  Vertebral artery flow is antegrade.     Averi Kilty, RVS 01/15/2013, 11:33 AM

## 2013-01-15 NOTE — Progress Notes (Signed)
  Echocardiogram 2D Echocardiogram has been performed.  Yesenia [name] 01/15/2013, 1:10 PM

## 2013-01-15 NOTE — Consult Note (Signed)
Physical Medicine and Rehabilitation Consult Reason for Consult: Suspect CVA Referring Physician: Teaching service   HPI: Yesenia [name] is a 52 y.o. right-handed female with history of hypertension. Admitted 01/14/2013 with dizziness and ataxic gait and right-sided numbness.  MRI of the brain premature white matter changes consistent with chronic microvascular ischemic change related to hypertension no acute intracranial abnormalities. 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 Echocardiogram pending. Carotid Dopplers with less than 39% ICA stenosis. Patient did not receive TPA. Neurology services consulted maintained on aspirin therapy for CVA prophylaxis as well as subcutaneous heparin for DVT prophylaxis. Recommendations are made for followup scan in 6 months for monitoring of PCOM aneurysm. Physical occupational therapy evaluations completed ongoing with recommendations of physical medicine rehabilitation consult to consider inpatient rehabilitation services.  When trying to move her right side and speak she has dysarthria When trying to move her left side and speak she has no dysarthric  Review of Systems  Musculoskeletal: Positive for falls.  Neurological: Positive for dizziness.       Decreased balance  Psychiatric/Behavioral:       Anxiety  All other systems reviewed and are negative.   Past Medical History  Diagnosis Date  . Hypertension    Past Surgical History  Procedure Laterality Date  . Leg surgery Right 2007    Surgery x3   Family History  Problem Relation Age of Onset  . Leukemia Brother    Social History:  has no tobacco, alcohol, and drug history on file. Allergies:  Allergies  Allergen Reactions  . Morphine And Related     Unknown   Medications Prior to Admission  Medication Sig Dispense Refill  . albuterol (PROVENTIL HFA;VENTOLIN HFA) 108 (90 BASE) MCG/ACT inhaler Inhale 2 puffs into  the lungs every 6 (six) hours as needed for wheezing.      Marland Kitchen ALPRAZolam (XANAX) 1 MG tablet Take 1 mg by mouth 3 (three) times daily as needed for sleep or anxiety.      . beclomethasone (QVAR) 80 MCG/ACT inhaler Inhale 1 puff into the lungs as needed.      . furosemide (LASIX) 20 MG tablet Take 20 mg by mouth daily.      . hydrochlorothiazide (HYDRODIURIL) 25 MG tablet Take 25 mg by mouth daily.      Marland Kitchen HYDROcodone-acetaminophen (NORCO/VICODIN) 5-325 MG per tablet Take 1 tablet by mouth every 6 (six) hours as needed for pain.        Home: Home Living Family/patient expects to be discharged to:: Other (Comment) (hotel) Living Arrangements: Alone Additional Comments: pt lives in one story hotel, daughter lives nearby but does not have a car and does not help much. She has a boyfriend that helps her do some shopping. She reports that she gets around her room OK but if she is up for any length of time, she gets so dizzy that she has to have a shopping cart or something to lean over to prevent falling  Functional History: Prior Function Comments: takes a stand up shower, reports she would not be able to get out of tub Functional Status:  Mobility: Bed Mobility Bed Mobility: Supine to Sit;Sitting - Scoot to Edge of Bed Supine to Sit: 4: Min assist;HOB elevated Sitting - Scoot to [employer] of Bed: 4: Min assist Transfers Transfers: Sit to Stand;Stand to [employer] Transfers Sit to Stand: 4: Min assist;With upper extremity assist;From bed Stand to Sit: 4: Min assist;To  chair/3-in-1;With upper extremity assist Stand Pivot Transfers: 4: Min assist Ambulation/Gait Ambulation/Gait Assistance: Not tested (comment) (right knee too unstable) Assistive device: Rolling walker Stairs: No Wheelchair Mobility Wheelchair Mobility: No  ADL:    Cognition: Cognition Overall Cognitive Status: History of cognitive impairments - at baseline Orientation Level: Oriented X4 Cognition Arousal/Alertness:  Awake/alert Behavior During Therapy: WFL for tasks assessed/performed Overall Cognitive Status: History of cognitive impairments - at baseline Memory: Decreased short-term memory  Blood pressure 130/82, pulse 103, temperature 98.4 F (36.9 C), temperature source Oral, resp. rate 20, height 5\' 6"  (1.676 m), weight 150.1 kg (330 lb 14.6 oz), SpO2 99.00%. Physical Exam  Vitals reviewed. Constitutional: She is oriented to person, place, and time.  HENT:  Head: Normocephalic.  Eyes: EOM are normal.  Neck: Normal range of motion. Neck supple. No thyromegaly present.  Cardiovascular: Normal rate and regular rhythm.   Pulmonary/Chest: Effort normal and breath sounds normal. No respiratory distress.  Abdominal: Soft. Bowel sounds are normal. There is no tenderness.  Musculoskeletal: She exhibits no edema.  Neurological: She is alert and oriented to person, place, and time.  Speech is a bit halting but was able to name person place and date of birth. She follows simple commands  Skin: Skin is warm and dry.   motor 4/5 in the right deltoid bicep triceps grip, 3 minus hip flexor 4 knee extensor ankle dorsiflexor plantar flexor on the left side 5/5 throughout Sensory intact to light touch and proprioception in upper and lower limbs Neuro:  Eyes without evidence of nystagmus  Tone is normal without evidence of spasticity Cerebellar exam shows no evidence of ataxia on finger nose finger or heel to shin testing   Cranial nerves II- Visual fields are intact to confrontation testing, no blurring of vision III- no evidence of ptosis, upward, downward and medial gaze intact IV- no vertical diplopia or head tilt V- no facial numbness or masseter weakness VI- no pupil abduction weakness VII- no facial droop, good lid closure VII- normal auditory acuity IX- no pharygeal weakness, gag nl X- no pharyngeal weakness, no hoarseness XI- no trap or SCM weakness XII- no glossal weakness    Results for  orders placed during the hospital encounter of 01/14/13 (from the past 24 hour(s))  GLUCOSE, CAPILLARY     Status: Abnormal   Collection Time    01/14/13 12:26 PM      Result Value Range   Glucose-Capillary 119 (*) 70 - 99 mg/dL  CBC     Status: None   Collection Time    01/14/13 12:50 PM      Result Value Range   WBC 7.6  4.0 - 10.5 K/uL   RBC 4.49  3.87 - 5.11 MIL/uL   Hemoglobin 12.8  12.0 - 15.0 g/dL   HCT 65.7  84.6 - 96.2 %   MCV 86.2  78.0 - 100.0 fL   MCH 28.5  26.0 - 34.0 pg   MCHC 33.1  30.0 - 36.0 g/dL   RDW 95.2  84.1 - 32.4 %   Platelets 307  150 - 400 K/uL  TROPONIN I     Status: None   Collection Time    01/14/13 12:52 PM      Result Value Range   Troponin I <0.30  <0.30 ng/mL  GLUCOSE, CAPILLARY     Status: Abnormal   Collection Time    01/14/13  3:41 PM      Result Value Range   Glucose-Capillary 101 (*) 70 -  99 mg/dL  GLUCOSE, CAPILLARY     Status: None   Collection Time    01/15/13 12:46 AM      Result Value Range   Glucose-Capillary 99  70 - 99 mg/dL  BASIC METABOLIC PANEL     Status: Abnormal   Collection Time    01/15/13  4:48 AM      Result Value Range   Sodium 140  135 - 145 mEq/L   Potassium 3.2 (*) 3.5 - 5.1 mEq/L   Chloride 102  96 - 112 mEq/L   CO2 29  19 - 32 mEq/L   Glucose, Bld 116 (*) 70 - 99 mg/dL   BUN 14  6 - 23 mg/dL   Creatinine, Ser 1.61  0.50 - 1.10 mg/dL   Calcium 8.8  8.4 - 09.6 mg/dL   GFR calc non Af Amer 67 (*) >90 mL/min   GFR calc Af Amer 78 (*) >90 mL/min  CBC     Status: Abnormal   Collection Time    01/15/13  4:48 AM      Result Value Range   WBC 7.2  4.0 - 10.5 K/uL   RBC 4.17  3.87 - 5.11 MIL/uL   Hemoglobin 11.9 (*) 12.0 - 15.0 g/dL   HCT 04.5  40.9 - 81.1 %   MCV 86.3  78.0 - 100.0 fL   MCH 28.5  26.0 - 34.0 pg   MCHC 33.1  30.0 - 36.0 g/dL   RDW 91.4  78.2 - 95.6 %   Platelets 316  150 - 400 K/uL  GLUCOSE, CAPILLARY     Status: Abnormal   Collection Time    01/15/13  6:30 AM      Result Value Range    Glucose-Capillary 126 (*) 70 - 99 mg/dL   Ct Head Wo Contrast  01/14/2013   *RADIOLOGY REPORT*  Clinical Data: Left sided headache, dizziness, blurred vision, aphasia.  CT HEAD WITHOUT CONTRAST  Technique:  Contiguous axial images were obtained from the base of the skull through the vertex without contrast.  Comparison: None.  Findings: There is a vague focal low attenuation change in the subcortical white matter of the left posterior parietal/occipital region.  This could represent small vessel ischemia versus early changes of acute infarct.  There is no mass effect or midline shift.  No ventricular dilatation.  No acute intracranial hemorrhage.  Gray-white matter junctions are distinct.  Basal cisterns are not effaced.  No abnormal extra-axial fluid collections.  No depressed skull fractures.  Visualized paranasal sinuses and mastoid air cells are not opacified.  IMPRESSION: Vague low attenuation change in the subcortical white matter of the left posterior parietal/occipital region may represent early changes of acute infarct versus small vessel ischemic change.  No significant mass effect.  No acute intracranial hemorrhage.   Original Report Authenticated By: Burman Nieves, M.D.   Mr [facility] Wo Contrast  01/14/2013   *RADIOLOGY REPORT*  Clinical Data:  79 old female with hypertension and chronic fatigue syndrome having frequent episodes of dizziness.  Presented to the ED with right-sided weakness that developed 01/11/2013. Stroke risk factors include hypertension.  MRI HEAD WITHOUT AND WITH CONTRAST MRA HEAD WITHOUT CONTRAST  Technique:  Multiplanar, multiecho pulse sequences of the brain and surrounding structures were obtained without and with intravenous contrast.  Angiographic images of the head were obtained using MRA technique without contrast.  Contrast: 20mL MULTIHANCE GADOBENATE DIMEGLUMINE 529 MG/ML IV SOLN  Comparison:   CT head 01/14/2013.  MRI HEAD WITHOUT AND WITH CONTRAST  Findings:  The  patient had difficulty remaining motionless for the study.  Images are suboptimal.  Small or subtle lesions could be overlooked.  There is no acute stroke or hemorrhage.  There is no mass lesion or hydrocephalus.  There is no extra-axial fluid.  Normal cerebral volume.  Increased signal in the subcortical and periventricular white matter, consistent with hypertensive related chronic microvascular ischemic change.  No signs of hypertensive encephalopathy.  No foci of chronic hemorrhage.  No midline shift.  Unremarkable calvarium and skull base.  No visible cervical spine abnormality.  Normal pituitary.  Mild tonsillar ectopia without definite Chiari I malformation.  Post contrast, there is no definite abnormal intracranial enhancement. No acute orbital, sinus, or mastoid abnormality.  Small intraparotid cyst superficial lobe on the left.  IMPRESSION: Premature white matter changes, consistent with chronic microvascular ischemic change related hypertension.  No acute intracranial findings.  No acute stroke or bleed.  MRA HEAD  Findings: The right internal carotid artery is dolichoectatic but widely patent.  The left internal carotid artery is dolichoectatic and displays shallow inferior cavernous outpouching, not frankly aneurysmal.  There is mild irregularity in the superior cavernous segment LICA with possible 50% stenosis.  The basilar artery is widely patent.  Both vertebrals contribute with a 50-75% stenosis of the distal right vertebral.  There is a fetal origin of the right posterior cerebral artery directly from the carotid.  Left PCA originates from the basilar tip.  Mild nonstenotic irregularity proximal right anterior cerebral artery.  Normal proximal left ACA.  Distal anterior cerebral arteries are unremarkable.  No proximal middle cerebral artery stenosis.  Mild irregularity of the distal middle cerebral artery branches without focal branch occlusion.  No PCA disease.  No cerebellar branch occlusion.   There is a 3 mm wide necked outpouching from the left posterior communicating origin at its junction with the left internal carotid artery.  This could represent a small aneurysm although an infundibulum could have this appearance on this motion degraded examination.  Consider 41-month MRA  follow-up.  IMPRESSION: Mild intracranial atherosclerotic changes as described.  No proximal flow reducing lesion.  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  Consider 6- month MRA follow-up.   Original Report Authenticated By: Davonna Belling, M.D.   Mr [facility] Wo Contrast  01/14/2013   *RADIOLOGY REPORT*  Clinical Data:  85 old female with hypertension and chronic fatigue syndrome having frequent episodes of dizziness.  Presented to the ED with right-sided weakness that developed 01/11/2013. Stroke risk factors include hypertension.  MRI HEAD WITHOUT AND WITH CONTRAST MRA HEAD WITHOUT CONTRAST  Technique:  Multiplanar, multiecho pulse sequences of the brain and surrounding structures were obtained without and with intravenous contrast.  Angiographic images of the head were obtained using MRA technique without contrast.  Contrast: 20mL MULTIHANCE GADOBENATE DIMEGLUMINE 529 MG/ML IV SOLN  Comparison:   CT head 01/14/2013.  MRI HEAD WITHOUT AND WITH CONTRAST  Findings:  The patient had difficulty remaining motionless for the study.  Images are suboptimal.  Small or subtle lesions could be overlooked.  There is no acute stroke or hemorrhage.  There is no mass lesion or hydrocephalus.  There is no extra-axial fluid.  Normal cerebral volume.  Increased signal in the subcortical and periventricular white matter, consistent with hypertensive related chronic microvascular ischemic change.  No signs of hypertensive encephalopathy.  No foci of chronic hemorrhage.  No midline shift.  Unremarkable calvarium and  skull base.  No visible cervical spine abnormality.  Normal pituitary.  Mild tonsillar ectopia without definite Chiari I  malformation.  Post contrast, there is no definite abnormal intracranial enhancement. No acute orbital, sinus, or mastoid abnormality.  Small intraparotid cyst superficial lobe on the left.  IMPRESSION: Premature white matter changes, consistent with chronic microvascular ischemic change related hypertension.  No acute intracranial findings.  No acute stroke or bleed.  MRA HEAD  Findings: The right internal carotid artery is dolichoectatic but widely patent.  The left internal carotid artery is dolichoectatic and displays shallow inferior cavernous outpouching, not frankly aneurysmal.  There is mild irregularity in the superior cavernous segment LICA with possible 50% stenosis.  The basilar artery is widely patent.  Both vertebrals contribute with a 50-75% stenosis of the distal right vertebral.  There is a fetal origin of the right posterior cerebral artery directly from the carotid.  Left PCA originates from the basilar tip.  Mild nonstenotic irregularity proximal right anterior cerebral artery.  Normal proximal left ACA.  Distal anterior cerebral arteries are unremarkable.  No proximal middle cerebral artery stenosis.  Mild irregularity of the distal middle cerebral artery branches without focal branch occlusion.  No PCA disease.  No cerebellar branch occlusion.  There is a 3 mm wide necked outpouching from the left posterior communicating origin at its junction with the left internal carotid artery.  This could represent a small aneurysm although an infundibulum could have this appearance on this motion degraded examination.  Consider 30-month MRA  follow-up.  IMPRESSION: Mild intracranial atherosclerotic changes as described.  No proximal flow reducing lesion.  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  Consider 6- month MRA follow-up.   Original Report Authenticated By: Davonna Belling, M.D.    Assessment/Plan: Diagnosis: right hemiparesis and inconsistent dysarthria, workup for CVA negative.  Question conversion disorder  1. Does the need for close, 24 hr/day medical supervision in concert with the patient's rehab needs make it unreasonable for this patient to be served in a less intensive setting? No 2. Co-Morbidities requiring supervision/potential complications:  hypertension  3. Due to Not applicable, does the patient require 24 hr/day rehab nursing? No 4. Does the patient require coordinated care of a physician, rehab nurse, Not applicable to address physical and functional deficits in the context of the above medical diagnosis(es)? No Addressing deficits in the following areas:  not applicable 5. Can the patient actively participate in an intensive therapy program of at least 3 hrs of therapy per day at least 5 days per week? Yes 6. The potential for patient to make measurable gains while on inpatient rehab is Not applicable 7. Anticipated functional outcomes upon discharge from inpatient rehab are NA with PT, NA with OT, NA with SLP. 8. Estimated rehab length of stay to reach the above functional goals is: NA 9. Does the patient have adequate social supports to accommodate these discharge functional goals? Potentially 10. Anticipated D/C setting: Home 11. Anticipated post D/C treatments: HH therapy 12. Overall Rehab/Functional Prognosis: good  RECOMMENDATIONS: This patient's condition is appropriate for continued rehabilitative care in the following setting: [facility] Patient has agreed to participate in recommended program. Potentially Note that insurance prior authorization may be required for reimbursement for recommended care.  Comment: Anticipate functional improvement over the next 2-3 d    01/15/2013

## 2013-01-15 NOTE — Progress Notes (Signed)
Family Medicine Teaching Service Daily Progress Note Intern Pager: [phone]  Patient name: Yesenia [name] Medical record number: [id number] Date of birth: [birth year] Age: 52 y.o. Gender: female  Primary Care Provider: Egbert Garibaldi, NP Consultants: Neurology Code Status: Full  Pt Overview and Major Events to Date:   7/6: Pt admitted for R sided weakness, dizziness, ataxia  7/7: CT/MRI/MRA r/o cerebral ischemia due to infarct, hemorrhage, stenosis  Assessment and Plan:  52 y.o. Female presenting with dizziness, ataxia, R sided weakness gradually worsening over past 2 months, worst on admission than any time before  # Acute right sided weakness - 2 month h/o R-sided weakness, dizziness, ataxia.   - Head CT/MRI/MRA negative for acute ischemic infarct, flow-limiting stenosis, or hemorrhage. - Etiology unclear - Carotid doppler did not show significant stenosis.  - Echocardiogram pending - Continuing ASA Ppx - Started lipitor for LDL = 141 - TSH wnl - Greatly appreciate nerology recommendations - Inpatient rehabilitation will be consulted for this and generalized weakness.   # Deconditioning - pt seems generally weak on exam.  - Consulting Inpatient Rehabilition.   # HTN - BP has been well controlled during this hospitalization.  - Hydralazing prn with parameters allowing permissive HTN.   # Anemia - h/o similar episode - CVA-like Sxs - in the past which corrected after transfusion.   - H/H remains stable, doubtful this is etiology of sxs.   FEN/GI: SLIV, Carb modified PPx: ASA for CVA, Heparin SubQ for DVT.  Disposition: Pending inpatient rehabilitation recommendation  Subjective: Pt with continued weakness but no dizziness. No SOB, CP, HA, vision or hearing changes.   Objective: Temp:  [97.9 F (36.6 C)-98.9 F (37.2 C)] 98.4 F (36.9 C) (07/07 0626) Pulse Rate:  [91-99] 99 (07/07 0626) Resp:  [18-20] 20 (07/07 0626) BP: (118-153)/(74-89) 130/82 mmHg (07/07  0626) SpO2:  [97 %-100 %] 99 % (07/07 0626) Physical Exam: General: Obese female in NAD Cardiovascular: RRR distant heart sounds, 2+ pulses bilaterally, no murmurs Respiratory: CTAB, normal WOB Abdomen: Obese, NT ND NABS Extremities: No leg tenderness/swelling, cpillary refill <2sec Neuro: Proximal strength 4/5 in lower extremities, dorsiflexion/plantarflexion of foot is 5/5.  Upper extremities 5/5. CN II-XII intact grossly  Laboratory:  Recent Labs Lab 01/14/13 0714 01/14/13 1250 01/15/13 0448  WBC 8.1 7.6 7.2  HGB 12.4 12.8 11.9*  HCT 37.0 38.7 36.0  PLT 300 307 316    Recent Labs Lab 01/14/13 0132 01/14/13 0136 01/14/13 0714 01/15/13 0448  NA 138 141  --  140  K 3.5 3.5  --  3.2*  CL 99 100  --  102  CO2 30  --   --  29  BUN 10 9  --  14  CREATININE 0.84 0.90 0.86 0.96  CALCIUM 9.3  --   --  8.8  PROT 8.0  --   --   --   BILITOT 0.3  --   --   --   ALKPHOS 111  --   --   --   ALT 20  --   --   --   AST 21  --   --   --   GLUCOSE 142* 145*  --  116*    ECG nsr, LVH, no ST changes  Imaging/Diagnostic Tests: Ct Head Wo Contrast  01/14/2013  Vague low attenuation change in the subcortical white matter of the left posterior parietal/occipital region may represent early changes of acute infarct versus small vessel ischemic change. No significant mass effect.  No acute intracranial hemorrhage.  MRI of Head  01/14/2013  Premature white matter changes, consistent with chronic  microvascular ischemic change related hypertension. No acute  intracranial findings. No acute stroke or bleed.  MRA of Head  01/14/2013  Mild intracranial atherosclerotic changes as described. No  proximal flow reducing lesion.  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 Consider 6-  month MRA follow-up.    Hazeline Junker, MD 01/15/2013, 8:13 AM PGY-1, [facility] Family Medicine FPTS Intern pager: [phone], text pages welcome

## 2013-01-15 NOTE — Progress Notes (Signed)
NEURO HOSPITALIST PROGRESS NOTE   SUBJECTIVE:                                                                                                                        Patient continues to have right leg >arm weakness associated with pain. She also continues to have intermittent stuttering while speaking. PT currently working with patient  OBJECTIVE:                                                                                                                           Vital signs in last 24 hours: Temp:  [97.9 F (36.6 C)-98.9 F (37.2 C)] 98.4 F (36.9 C) (07/07 0626) Pulse Rate:  [91-99] 99 (07/07 0626) Resp:  [18-20] 20 (07/07 0626) BP: (118-153)/(74-89) 130/82 mmHg (07/07 0626) SpO2:  [97 %-100 %] 99 % (07/07 0626)  Intake/Output from previous day: 07/06 0701 - 07/07 0700 In: 1302.5 [P.O.:120; I.V.:1182.5] Out: 2300 [Urine:2300] Intake/Output this shift: Total I/O In: 240 [P.O.:240] Out: -  Nutritional status: Carb Control  Past Medical History  Diagnosis Date  . Hypertension      Neurologic Exam:  Mental Status: Alert, oriented, thought content appropriate.  Speech shows stuttering without evidence of aphasia.  Able to follow 3 step commands without difficulty. Cranial Nerves: II: Visual fields grossly normal, pupils equal, round, reactive to light and accommodation III,IV, VI: ptosis not present, extra-ocular motions intact bilaterally V,VII: smile symmetric, facial light touch sensation normal bilaterally VIII: hearing normal bilaterally IX,X: gag reflex present XI: bilateral shoulder shrug XII: midline tongue extension Motor: Right : Upper extremity   4/5     Left:     Upper extremity   5/5  Lower extremity   4/5     Lower extremity   5/5 --Right lower extremity strength limited by pain and at times effort.  Tone and bulk:normal tone throughout; no atrophy noted Sensory: Pinprick and light touch intact throughout,  bilaterally Deep Tendon Reflexes:  Right: Upper Extremity   Left: Upper extremity   biceps (C-5 to C-6) 2/4   biceps (C-5 to C-6) 2/4 tricep (C7) 2/4    triceps (C7) 2/4 Brachioradialis (C6) 2/4  Brachioradialis (C6) 2/4  Lower Extremity Lower Extremity  quadriceps (L-2  to L-4) 1/4   quadriceps (L-2 to L-4) 1/4 Achilles (S1) 2/4   Achilles (S1) 2/4  Plantars: Right: downgoing   Left: downgoing Cerebellar: normal finger-to-nose,   CV: pulses palpable throughout    Lab Results: Lab Results  Component Value Date/Time   CHOL 224* 01/14/2013  7:15 AM   Lipid Panel  Recent Labs  01/14/13 0715  CHOL 224*  TRIG 133  HDL 56  CHOLHDL 4.0  VLDL 27  LDLCALC 478*    Studies/Results: Ct Head Wo Contrast  01/14/2013   *RADIOLOGY REPORT*  Clinical Data: Left sided headache, dizziness, blurred vision, aphasia.  CT HEAD WITHOUT CONTRAST  Technique:  Contiguous axial images were obtained from the base of the skull through the vertex without contrast.  Comparison: None.  Findings: There is a vague focal low attenuation change in the subcortical white matter of the left posterior parietal/occipital region.  This could represent small vessel ischemia versus early changes of acute infarct.  There is no mass effect or midline shift.  No ventricular dilatation.  No acute intracranial hemorrhage.  Gray-white matter junctions are distinct.  Basal cisterns are not effaced.  No abnormal extra-axial fluid collections.  No depressed skull fractures.  Visualized paranasal sinuses and mastoid air cells are not opacified.  IMPRESSION: Vague low attenuation change in the subcortical white matter of the left posterior parietal/occipital region may represent early changes of acute infarct versus small vessel ischemic change.  No significant mass effect.  No acute intracranial hemorrhage.   Original Report Authenticated By: Burman Nieves, M.D.   Mr [facility] Wo Contrast  01/14/2013   *RADIOLOGY REPORT*  Clinical  Data:  72 old female with hypertension and chronic fatigue syndrome having frequent episodes of dizziness.  Presented to the ED with right-sided weakness that developed 01/11/2013. Stroke risk factors include hypertension.  MRI HEAD WITHOUT AND WITH CONTRAST MRA HEAD WITHOUT CONTRAST  Technique:  Multiplanar, multiecho pulse sequences of the brain and surrounding structures were obtained without and with intravenous contrast.  Angiographic images of the head were obtained using MRA technique without contrast.  Contrast: 20mL MULTIHANCE GADOBENATE DIMEGLUMINE 529 MG/ML IV SOLN  Comparison:   CT head 01/14/2013.  MRI HEAD WITHOUT AND WITH CONTRAST  Findings:  The patient had difficulty remaining motionless for the study.  Images are suboptimal.  Small or subtle lesions could be overlooked.  There is no acute stroke or hemorrhage.  There is no mass lesion or hydrocephalus.  There is no extra-axial fluid.  Normal cerebral volume.  Increased signal in the subcortical and periventricular white matter, consistent with hypertensive related chronic microvascular ischemic change.  No signs of hypertensive encephalopathy.  No foci of chronic hemorrhage.  No midline shift.  Unremarkable calvarium and skull base.  No visible cervical spine abnormality.  Normal pituitary.  Mild tonsillar ectopia without definite Chiari I malformation.  Post contrast, there is no definite abnormal intracranial enhancement. No acute orbital, sinus, or mastoid abnormality.  Small intraparotid cyst superficial lobe on the left.  IMPRESSION: Premature white matter changes, consistent with chronic microvascular ischemic change related hypertension.  No acute intracranial findings.  No acute stroke or bleed.  MRA HEAD  Findings: The right internal carotid artery is dolichoectatic but widely patent.  The left internal carotid artery is dolichoectatic and displays shallow inferior cavernous outpouching, not frankly aneurysmal.  There is mild irregularity  in the superior cavernous segment LICA with possible 50% stenosis.  The basilar artery is widely patent.  Both vertebrals  contribute with a 50-75% stenosis of the distal right vertebral.  There is a fetal origin of the right posterior cerebral artery directly from the carotid.  Left PCA originates from the basilar tip.  Mild nonstenotic irregularity proximal right anterior cerebral artery.  Normal proximal left ACA.  Distal anterior cerebral arteries are unremarkable.  No proximal middle cerebral artery stenosis.  Mild irregularity of the distal middle cerebral artery branches without focal branch occlusion.  No PCA disease.  No cerebellar branch occlusion.  There is a 3 mm wide necked outpouching from the left posterior communicating origin at its junction with the left internal carotid artery.  This could represent a small aneurysm although an infundibulum could have this appearance on this motion degraded examination.  Consider 42-month MRA  follow-up.  IMPRESSION: Mild intracranial atherosclerotic changes as described.  No proximal flow reducing lesion.  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  Consider 6- month MRA follow-up.   Original Report Authenticated By: Davonna Belling, M.D.   Mr [facility] Wo Contrast  01/14/2013   *RADIOLOGY REPORT*  Clinical Data:  64 old female with hypertension and chronic fatigue syndrome having frequent episodes of dizziness.  Presented to the ED with right-sided weakness that developed 01/11/2013. Stroke risk factors include hypertension.  MRI HEAD WITHOUT AND WITH CONTRAST MRA HEAD WITHOUT CONTRAST  Technique:  Multiplanar, multiecho pulse sequences of the brain and surrounding structures were obtained without and with intravenous contrast.  Angiographic images of the head were obtained using MRA technique without contrast.  Contrast: 20mL MULTIHANCE GADOBENATE DIMEGLUMINE 529 MG/ML IV SOLN  Comparison:   CT head 01/14/2013.  MRI HEAD WITHOUT AND WITH CONTRAST   Findings:  The patient had difficulty remaining motionless for the study.  Images are suboptimal.  Small or subtle lesions could be overlooked.  There is no acute stroke or hemorrhage.  There is no mass lesion or hydrocephalus.  There is no extra-axial fluid.  Normal cerebral volume.  Increased signal in the subcortical and periventricular white matter, consistent with hypertensive related chronic microvascular ischemic change.  No signs of hypertensive encephalopathy.  No foci of chronic hemorrhage.  No midline shift.  Unremarkable calvarium and skull base.  No visible cervical spine abnormality.  Normal pituitary.  Mild tonsillar ectopia without definite Chiari I malformation.  Post contrast, there is no definite abnormal intracranial enhancement. No acute orbital, sinus, or mastoid abnormality.  Small intraparotid cyst superficial lobe on the left.  IMPRESSION: Premature white matter changes, consistent with chronic microvascular ischemic change related hypertension.  No acute intracranial findings.  No acute stroke or bleed.  MRA HEAD  Findings: The right internal carotid artery is dolichoectatic but widely patent.  The left internal carotid artery is dolichoectatic and displays shallow inferior cavernous outpouching, not frankly aneurysmal.  There is mild irregularity in the superior cavernous segment LICA with possible 50% stenosis.  The basilar artery is widely patent.  Both vertebrals contribute with a 50-75% stenosis of the distal right vertebral.  There is a fetal origin of the right posterior cerebral artery directly from the carotid.  Left PCA originates from the basilar tip.  Mild nonstenotic irregularity proximal right anterior cerebral artery.  Normal proximal left ACA.  Distal anterior cerebral arteries are unremarkable.  No proximal middle cerebral artery stenosis.  Mild irregularity of the distal middle cerebral artery branches without focal branch occlusion.  No PCA disease.  No cerebellar branch  occlusion.  There is a 3 mm wide necked outpouching  from the left posterior communicating origin at its junction with the left internal carotid artery.  This could represent a small aneurysm although an infundibulum could have this appearance on this motion degraded examination.  Consider 79-month MRA  follow-up.  IMPRESSION: Mild intracranial atherosclerotic changes as described.  No proximal flow reducing lesion.  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  Consider 6- month MRA follow-up.   Original Report Authenticated By: Davonna Belling, M.D.    MEDICATIONS                                                                                                                        Prior to Admission:  Prescriptions prior to admission  Medication Sig Dispense Refill  . albuterol (PROVENTIL HFA;VENTOLIN HFA) 108 (90 BASE) MCG/ACT inhaler Inhale 2 puffs into the lungs every 6 (six) hours as needed for wheezing.      Marland Kitchen ALPRAZolam (XANAX) 1 MG tablet Take 1 mg by mouth 3 (three) times daily as needed for sleep or anxiety.      . beclomethasone (QVAR) 80 MCG/ACT inhaler Inhale 1 puff into the lungs as needed.      . furosemide (LASIX) 20 MG tablet Take 20 mg by mouth daily.      . hydrochlorothiazide (HYDRODIURIL) 25 MG tablet Take 25 mg by mouth daily.      Marland Kitchen HYDROcodone-acetaminophen (NORCO/VICODIN) 5-325 MG per tablet Take 1 tablet by mouth every 6 (six) hours as needed for pain.       Scheduled: . aspirin  300 mg Rectal Daily   Or  . aspirin  325 mg Oral Daily  . atorvastatin  20 mg Oral q1800  . heparin  5,000 Units Subcutaneous Q8H    ASSESSMENT/PLAN:                                                                                                             52 YO female with 2 month history of gradual onset right arm/ leg weakness, off balance and falls. MRI/MRA negative for acute infarct or flow reducing stenosis. 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 Etiology remains unclear.    Recommend: 1) Carotid Doppler --pending 2) Echo--pending 3) Continue ASA 4) PT for deconditioning    Assessment and plan discussed with with attending physician and they are in agreement.    Felicie Morn PA-C [facility] [phone]  01/15/2013, 9:24 AM
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
## 2013-01-15 NOTE — Progress Notes (Signed)
Rehab Admissions Coordinator Note:  Patient was screened by Brock Ra for appropriateness for an Inpatient Acute Rehab Consult.  At this time, we are recommending Inpatient Rehab consult.  Melanee Spry S 01/15/2013, 10:30 AM  I can be reached at [phone].

## 2013-01-16 LAB — CBC
HCT: 34.4 % — ABNORMAL LOW (ref 36.0–46.0)
MCHC: 32.8 g/dL (ref 30.0–36.0)
RDW: 14.1 % (ref 11.5–15.5)

## 2013-01-16 LAB — BASIC METABOLIC PANEL
BUN: 14 mg/dL (ref 6–23)
Chloride: 108 mEq/L (ref 96–112)
Creatinine, Ser: 0.95 mg/dL (ref 0.50–1.10)
GFR calc Af Amer: 79 mL/min — ABNORMAL LOW (ref >90)

## 2013-01-16 LAB — GLUCOSE, CAPILLARY

## 2013-01-16 MED ORDER — ALBUTEROL SULFATE HFA 108 (90 BASE) MCG/ACT IN AERS
2.0000 | INHALATION_SPRAY | Freq: Four times a day (QID) | RESPIRATORY_TRACT | Status: DC | PRN
  Administered 2013-01-16: 2 via RESPIRATORY_TRACT
  Filled 2013-01-16: qty 6.7

## 2013-01-16 MED ORDER — ATORVASTATIN CALCIUM 20 MG PO TABS: 20.0000 mg | ORAL_TABLET | Freq: Every day | ORAL | Status: DC

## 2013-01-16 MED ORDER — FLUTICASONE PROPIONATE HFA 44 MCG/ACT IN AERO
1.0000 | INHALATION_SPRAY | Freq: Two times a day (BID) | RESPIRATORY_TRACT | Status: DC
  Administered 2013-01-16: 1 via RESPIRATORY_TRACT
  Filled 2013-01-16: qty 10.6

## 2013-01-16 MED ORDER — ASPIRIN 81 MG PO TABS: 81.0000 mg | ORAL_TABLET | Freq: Every day | ORAL | Status: DC
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
## 2013-01-16 NOTE — Care Management Note (Signed)
    Page 1 of 2   01/16/2013     2:46:14 PM   CARE MANAGEMENT NOTE 01/16/2013  Patient:  PERLE, BRICKHOUSE   Account Number:  [id number]  Date Initiated:  01/15/2013  Documentation initiated by:  Jiles Crocker  Subjective/Objective Assessment:   ADMITTED WITH Acute ischemic infarct     Action/Plan:   Primary Care Kasai Beltran: Egbert Garibaldi, NP  LIVES AT HOME WITH HER SPOUSE; CM FOLLOWING FOR DCP/ AWAITING PT/OT EVALS   Anticipated DC Date:  01/19/2013   Anticipated DC Plan:  HOME/SELF CARE      DC Planning Services  CM consult      Choice offered to / List presented to:  C-1 Patient   DME arranged  Levan Hurst      DME agency  [facility]     HH arranged  HH-2 PT  HH-3 OT      [facility] [place] agency  [facility]   Status of service:  In process, will continue to follow Medicare Important Message given?  NA - LOS <3 / Initial given by admissions (If response is "NO", the following Medicare IM given date fields will be blank) Date Medicare IM given:   Date Additional Medicare IM given:    Discharge Disposition:    Per UR Regulation:  Reviewed for med. necessity/level of care/duration of stay  If discussed at Long Length of Stay Meetings, dates discussed:    Comments:  01/16/13 1440 Elmer Bales RN, MSN CM- Met with patient to discuss home health needs. Pt states she will need a rolling walker and is interested in [facility] PT/OT.  Pt has chosen to use [facility] was notified of referral.  Pt is currently residing at [facility] [address] Room [address] [place] n [place].  Pt states that she does not have any transportation for discharge, if discharged today.  CSW was notified of patient's transportation needs.  01/15/2013- B CHANDLER RN,BSN,MHA

## 2013-01-16 NOTE — Progress Notes (Signed)
Family Medicine Teaching Service Daily Progress Note Intern Pager: [phone]  Patient name: Yesenia [name] Medical record number: [id number] Date of birth: Feb 18, 1961 Age: 52 y.o. Gender: female  Primary Care Provider: Egbert Garibaldi, NP Consultants: Neurology Code Status: Full  Pt Overview and Major Events to Date:   7/6: Pt admitted for R sided weakness, dizziness, ataxia  7/7: CT/MRI/MRA r/o cerebral ischemia due to infarct, hemorrhage, stenosis   Assessment and Plan:  52 y.o. Female presenting with dizziness, ataxia, R sided weakness gradually worsening over past 2 months, worst on admission than any time before  # Acute right sided weakness - 2 month h/o R-sided weakness, dizziness, ataxia.   - Work-up to date has been negative including Head CT/MRI/MRA, carotid doppler, echocardiogram.  - Etiology unclear - Continuing ASA Ppx - Started lipitor for LDL = 141 - TSH wnl - Greatly appreciate neurology recommendations - PM&R recommends home health, anticipates functional improvement over next 2-3 days.   # Deconditioning - pt seems generally weak on exam.  - Consulting Inpatient Rehabilition.   # HTN - BP has been well controlled during this hospitalization.  - Hydralazing prn with parameters allowing permissive HTN.   # Anemia - h/o similar episode - CVA-like Sxs - in the past which corrected after transfusion.   - H/H remains stable, doubtful this is etiology of sxs.   FEN/GI: SLIV, Carb modified PPx: ASA for CVA, Heparin SubQ for DVT.  Disposition: Discussing home health upon D/C today  Subjective: Pt with continued weakness but no dizziness. No SOB, CP, HA, vision or hearing changes.   Objective: Temp:  [97.9 F (36.6 C)-98.6 F (37 C)] 97.9 F (36.6 C) (07/07 2109) Pulse Rate:  [92-103] 94 (07/08 0223) Resp:  [18-22] 22 (07/08 0223) BP: (134-148)/(76-84) 148/84 mmHg (07/08 0223) SpO2:  [97 %-98 %] 97 % (07/08 0223) Physical Exam: General: Obese female in  NAD Cardiovascular: RRR distant heart sounds, 2+ pulses bilaterally, no murmurs Respiratory: CTAB, normal WOB Abdomen: Obese, NT ND NABS Extremities: No leg tenderness/swelling, cpillary refill <2sec Neuro: Proximal strength 4/5 in lower extremities, dorsiflexion/plantarflexion of foot is 5/5.  Upper extremities 5/5. CN II-XII intact grossly  Laboratory:  Recent Labs Lab 01/14/13 1250 01/15/13 0448 01/16/13 0500  WBC 7.6 7.2 6.9  HGB 12.8 11.9* 11.3*  HCT 38.7 36.0 34.4*  PLT 307 316 303    Recent Labs Lab 01/14/13 0132 01/14/13 0136 01/14/13 0714 01/15/13 0448 01/16/13 0500  NA 138 141  --  140 144  K 3.5 3.5  --  3.2* 3.4*  CL 99 100  --  102 108  CO2 30  --   --  29 27  BUN 10 9  --  14 14  CREATININE 0.84 0.90 0.86 0.96 0.95  CALCIUM 9.3  --   --  8.8 8.9  PROT 8.0  --   --   --   --   BILITOT 0.3  --   --   --   --   ALKPHOS 111  --   --   --   --   ALT 20  --   --   --   --   AST 21  --   --   --   --   GLUCOSE 142* 145*  --  116* 120*    ECG nsr, LVH, no ST changes  Imaging/Diagnostic Tests: Ct Head Wo Contrast  01/14/2013  Vague low attenuation change in the subcortical white matter of the left  posterior parietal/occipital region may represent early changes of acute infarct versus small vessel ischemic change. No significant mass effect. No acute intracranial hemorrhage.  MRI of Head  01/14/2013  Premature white matter changes, consistent with chronic  microvascular ischemic change related hypertension. No acute  intracranial findings. No acute stroke or bleed.  MRA of Head  01/14/2013  Mild intracranial atherosclerotic changes as described. No  proximal flow reducing lesion.  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 Consider 6-  month MRA follow-up.  Echocardiogram 01/15/13  Left ventricle: The cavity size was normal. Systolic function was normal. The estimated ejection fraction was in the range of 50% to 55%. There is severe hypokinesis  of the basal-midinferoseptal myocardium. There was an increased relative contribution of atrial contraction to ventricular filling. Doppler parameters are consistent with abnormal left ventricular relaxation (grade 1 diastolic dysfunction). - Mitral valve: Calcified annulus.    Hazeline Junker, MD 01/16/2013, 7:29 AM PGY-1, [facility] Family Medicine FPTS Intern pager: [phone], text pages welcome

## 2013-01-16 NOTE — Discharge Summary (Signed)
Family Medicine Teaching [facility] Discharge Summary  Patient name: Yesenia [name] Medical record number: [id number] Date of birth: March 16, 1961 Age: 52 y.o. Gender: female Date of Admission: 01/14/2013  Date of Discharge: 01/16/13 Admitting Physician: Carney Living, MD  Primary Care Provider: Egbert Garibaldi, NP Consultants: Neurology, Physical Medicine & Rehabilitation  Indication for Hospitalization: New onset right-sided weakness, dizziness.   Discharge Diagnoses/Problem List:  TIA Deconditioning HTN Morbid obesity Asthma  Disposition: To home with home health physical therapy.   Discharge Condition: Stable  Brief Hospital Course:  Yesenia [name] is a 52 y.o. Female presenting with dizziness, ataxia, R sided weakness gradually worsening over past 2 months, worst on admission than any time before.    Neurological work-up was negative including Head CT/MRI/MRA, carotid doppler, echocardiogram. Treating for a presumed TIA we continue ASA, started statin, and continued BP control. In the differential diagnosis for this presentation, psychogenic factors were considered as contributing to patient's presenting symptoms involving right extremities. No investigation was indicated during this admission, but follow-up will be valuable in this assessment.   Pt appeared with generalized weakness and apparent deconditioning. PM&R recommended home health upon discharge.  The patient's BP was well controlled during this hospitalization. Pt was discharged on HCTZ and lasix.  History of anemia: h/o similar episode in the past which corrected with transfusion, though hemoglobin was stable on this admission and was not thought to be a contributing factor.   There was no exacerbation of asthma, and home medications were continued.   Issues for Follow Up:  1. Addressing social barriers to health. Pt living in hotel with minimal social support and chronic deconditioning. 2. Assessing for  psychiatric co-morbidities. 2. Recommend 18-month re-imaging with MRA of head due to infundibulum vs. aneurysm in posterior communicating artery (see below).  Significant Procedures: None  Significant Labs and Imaging:   Recent Labs Lab 01/14/13 1250 01/15/13 0448 01/16/13 0500  WBC 7.6 7.2 6.9  HGB 12.8 11.9* 11.3*  HCT 38.7 36.0 34.4*  PLT 307 316 303    Recent Labs Lab 01/14/13 0132 01/14/13 0136 01/14/13 0714 01/15/13 0448 01/16/13 0500  NA 138 141  --  140 144  K 3.5 3.5  --  3.2* 3.4*  CL 99 100  --  102 108  CO2 30  --   --  29 27  GLUCOSE 142* 145*  --  116* 120*  BUN 10 9  --  14 14  CREATININE 0.84 0.90 0.86 0.96 0.95  CALCIUM 9.3  --   --  8.8 8.9  ALKPHOS 111  --   --   --   --   AST 21  --   --   --   --   ALT 20  --   --   --   --   ALBUMIN 3.8  --   --   --   --     ECG nsr, LVH, no ST changes    Ct Head Wo Contrast  01/14/2013  Vague low attenuation change in the subcortical white matter of the left posterior parietal/occipital region may represent early changes of acute infarct versus small vessel ischemic change. No significant mass effect. No acute intracranial hemorrhage.  MRI of Head  01/14/2013  Premature white matter changes, consistent with chronic  microvascular ischemic change related hypertension. No acute  intracranial findings. No acute stroke or bleed.  MRA of Head  01/14/2013  Mild intracranial atherosclerotic changes as described. No  proximal flow reducing  lesion.  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 Consider 6-  month MRA follow-up.  Carotid doppler 01/14/13 Less than 39% ICA stenosis Echocardiogram  01/15/13  Left ventricle: The cavity size was normal. Systolic function was normal. The estimated ejection fraction was in the range of 50% to 55%. There is severe hypokinesis of the basal-midinferoseptal myocardium. There was an increased relative contribution of atrial contraction to ventricular filling. Doppler  parameters are consistent with abnormal left ventricular relaxation (grade 1 diastolic dysfunction). - Mitral valve: Calcified annulus.  Outstanding Results: None  Discharge Medications:    Medication List         albuterol 108 (90 BASE) MCG/ACT inhaler  Commonly known as:  PROVENTIL HFA;VENTOLIN HFA  Inhale 2 puffs into the lungs every 6 (six) hours as needed for wheezing.     ALPRAZolam 1 MG tablet  Commonly known as:  XANAX  Take 1 mg by mouth 3 (three) times daily as needed for sleep or anxiety.     aspirin 81 MG tablet  Take 1 tablet (81 mg total) by mouth daily.     atorvastatin 20 MG tablet  Commonly known as:  LIPITOR  Take 1 tablet (20 mg total) by mouth daily.     beclomethasone 80 MCG/ACT inhaler  Commonly known as:  QVAR  Inhale 1 puff into the lungs as needed.     furosemide 20 MG tablet  Commonly known as:  LASIX  Take 20 mg by mouth daily.     hydrochlorothiazide 25 MG tablet  Commonly known as:  HYDRODIURIL  Take 25 mg by mouth daily.     HYDROcodone-acetaminophen 5-325 MG per tablet  Commonly known as:  NORCO/VICODIN  Take 1 tablet by mouth every 6 (six) hours as needed for pain.        Discharge Instructions: Please refer to Patient Instructions section of EMR for full details.  Patient was counseled important signs and symptoms that should prompt return to medical care, changes in medications, dietary instructions, activity restrictions, and follow up appointments.   Follow-Up Appointments:     Follow-up Information   Follow up with [name], [name], NP. Schedule an appointment as soon as possible for a visit in 1 week.   Contact information:   [facility] [address] [place] [address] [phone]       Hazeline Junker, MD 01/16/2013, 4:48 PM PGY-1, [facility] Family Medicine

## 2013-01-16 NOTE — Progress Notes (Signed)
Subjective: No new complaints. Pain and apparent weakness of right extremities unchanged. Rehabilitation service recommended home health intervention.  Objective: Current vital signs: BP 133/65  Pulse 93  Temp(Src) 97.8 F (36.6 C) (Oral)  Resp 20  Ht 5\' 6"  (1.676 m)  Wt 152.091 kg (335 lb 4.8 oz)  BMI 54.14 kg/m2  SpO2 99%  Neurologic Exam: Alert and in no acute distress. Patient is well-oriented to time as well as place. Affect was depressed. Extraocular movements were full and conjugate. No facial weakness noted. Strength of extremities was normal throughout. Effort with examination of right lower extremities was somewhat or and patient complained of discomfort with movement of her right lower extremity. Coordination was normal.  Lab Results: Results for orders placed during the hospital encounter of 01/14/13 (from the past 48 hour(s))  GLUCOSE, CAPILLARY     Status: Abnormal   Collection Time    01/14/13  9:44 AM      Result Value Range   Glucose-Capillary 126 (*) 70 - 99 mg/dL  GLUCOSE, CAPILLARY     Status: Abnormal   Collection Time    01/14/13 12:26 PM      Result Value Range   Glucose-Capillary 119 (*) 70 - 99 mg/dL  CBC     Status: None   Collection Time    01/14/13 12:50 PM      Result Value Range   WBC 7.6  4.0 - 10.5 K/uL   RBC 4.49  3.87 - 5.11 MIL/uL   Hemoglobin 12.8  12.0 - 15.0 g/dL   HCT 16.1  09.6 - 04.5 %   MCV 86.2  78.0 - 100.0 fL   MCH 28.5  26.0 - 34.0 pg   MCHC 33.1  30.0 - 36.0 g/dL   RDW 40.9  81.1 - 91.4 %   Platelets 307  150 - 400 K/uL  TROPONIN I     Status: None   Collection Time    01/14/13 12:52 PM      Result Value Range   Troponin I <0.30  <0.30 ng/mL   Comment:            Due to the release kinetics of cTnI,     a negative result within the first hours     of the onset of symptoms does not rule out     myocardial infarction with certainty.     If myocardial infarction is still suspected,     repeat the test at appropriate  intervals.  GLUCOSE, CAPILLARY     Status: Abnormal   Collection Time    01/14/13  3:41 PM      Result Value Range   Glucose-Capillary 101 (*) 70 - 99 mg/dL  GLUCOSE, CAPILLARY     Status: None   Collection Time    01/15/13 12:46 AM      Result Value Range   Glucose-Capillary 99  70 - 99 mg/dL  BASIC METABOLIC PANEL     Status: Abnormal   Collection Time    01/15/13  4:48 AM      Result Value Range   Sodium 140  135 - 145 mEq/L   Potassium 3.2 (*) 3.5 - 5.1 mEq/L   Chloride 102  96 - 112 mEq/L   CO2 29  19 - 32 mEq/L   Glucose, Bld 116 (*) 70 - 99 mg/dL   BUN 14  6 - 23 mg/dL   Creatinine, Ser 7.82  0.50 - 1.10 mg/dL   Calcium 8.8  8.4 - 10.5 mg/dL   GFR calc non Af Amer 67 (*) >90 mL/min   GFR calc Af Amer 78 (*) >90 mL/min   Comment:            The eGFR has been calculated     using the CKD EPI equation.     This calculation has not been     validated in all clinical     situations.     eGFR's persistently     <90 mL/min signify     possible Chronic Kidney Disease.  CBC     Status: Abnormal   Collection Time    01/15/13  4:48 AM      Result Value Range   WBC 7.2  4.0 - 10.5 K/uL   RBC 4.17  3.87 - 5.11 MIL/uL   Hemoglobin 11.9 (*) 12.0 - 15.0 g/dL   HCT 91.4  78.2 - 95.6 %   MCV 86.3  78.0 - 100.0 fL   MCH 28.5  26.0 - 34.0 pg   MCHC 33.1  30.0 - 36.0 g/dL   RDW 21.3  08.6 - 57.8 %   Platelets 316  150 - 400 K/uL  GLUCOSE, CAPILLARY     Status: Abnormal   Collection Time    01/15/13  6:30 AM      Result Value Range   Glucose-Capillary 126 (*) 70 - 99 mg/dL  GLUCOSE, CAPILLARY     Status: Abnormal   Collection Time    01/15/13  9:08 PM      Result Value Range   Glucose-Capillary 121 (*) 70 - 99 mg/dL   Comment 1 Documented in Chart     Comment 2 Notify RN    BASIC METABOLIC PANEL     Status: Abnormal   Collection Time    01/16/13  5:00 AM      Result Value Range   Sodium 144  135 - 145 mEq/L   Potassium 3.4 (*) 3.5 - 5.1 mEq/L   Chloride 108  96 - 112  mEq/L   CO2 27  19 - 32 mEq/L   Glucose, Bld 120 (*) 70 - 99 mg/dL   BUN 14  6 - 23 mg/dL   Creatinine, Ser 4.69  0.50 - 1.10 mg/dL   Calcium 8.9  8.4 - 62.9 mg/dL   GFR calc non Af Amer 68 (*) >90 mL/min   GFR calc Af Amer 79 (*) >90 mL/min   Comment:            The eGFR has been calculated     using the CKD EPI equation.     This calculation has not been     validated in all clinical     situations.     eGFR's persistently     <90 mL/min signify     possible Chronic Kidney Disease.  CBC     Status: Abnormal   Collection Time    01/16/13  5:00 AM      Result Value Range   WBC 6.9  4.0 - 10.5 K/uL   RBC 3.98  3.87 - 5.11 MIL/uL   Hemoglobin 11.3 (*) 12.0 - 15.0 g/dL   HCT 52.8 (*) 41.3 - 24.4 %   MCV 86.4  78.0 - 100.0 fL   MCH 28.4  26.0 - 34.0 pg   MCHC 32.8  30.0 - 36.0 g/dL   RDW 01.0  27.2 - 53.6 %   Platelets 303  150 - 400 K/uL  GLUCOSE, CAPILLARY     Status: Abnormal   Collection Time    01/16/13  6:31 AM      Result Value Range   Glucose-Capillary 102 (*) 70 - 99 mg/dL   Comment 1 Documented in Chart     Comment 2 Notify RN      Studies/Results: Mr Shirlee Latch Wo Contrast  01/14/2013   *RADIOLOGY REPORT*  Clinical Data:  19 old female with hypertension and chronic fatigue syndrome having frequent episodes of dizziness.  Presented to the ED with right-sided weakness that developed 01/11/2013. Stroke risk factors include hypertension.  MRI HEAD WITHOUT AND WITH CONTRAST MRA HEAD WITHOUT CONTRAST  Technique:  Multiplanar, multiecho pulse sequences of the brain and surrounding structures were obtained without and with intravenous contrast.  Angiographic images of the head were obtained using MRA technique without contrast.  Contrast: 20mL MULTIHANCE GADOBENATE DIMEGLUMINE 529 MG/ML IV SOLN  Comparison:   CT head 01/14/2013.  MRI HEAD WITHOUT AND WITH CONTRAST  Findings:  The patient had difficulty remaining motionless for the study.  Images are suboptimal.  Small or subtle  lesions could be overlooked.  There is no acute stroke or hemorrhage.  There is no mass lesion or hydrocephalus.  There is no extra-axial fluid.  Normal cerebral volume.  Increased signal in the subcortical and periventricular white matter, consistent with hypertensive related chronic microvascular ischemic change.  No signs of hypertensive encephalopathy.  No foci of chronic hemorrhage.  No midline shift.  Unremarkable calvarium and skull base.  No visible cervical spine abnormality.  Normal pituitary.  Mild tonsillar ectopia without definite Chiari I malformation.  Post contrast, there is no definite abnormal intracranial enhancement. No acute orbital, sinus, or mastoid abnormality.  Small intraparotid cyst superficial lobe on the left.  IMPRESSION: Premature white matter changes, consistent with chronic microvascular ischemic change related hypertension.  No acute intracranial findings.  No acute stroke or bleed.  MRA HEAD  Findings: The right internal carotid artery is dolichoectatic but widely patent.  The left internal carotid artery is dolichoectatic and displays shallow inferior cavernous outpouching, not frankly aneurysmal.  There is mild irregularity in the superior cavernous segment LICA with possible 50% stenosis.  The basilar artery is widely patent.  Both vertebrals contribute with a 50-75% stenosis of the distal right vertebral.  There is a fetal origin of the right posterior cerebral artery directly from the carotid.  Left PCA originates from the basilar tip.  Mild nonstenotic irregularity proximal right anterior cerebral artery.  Normal proximal left ACA.  Distal anterior cerebral arteries are unremarkable.  No proximal middle cerebral artery stenosis.  Mild irregularity of the distal middle cerebral artery branches without focal branch occlusion.  No PCA disease.  No cerebellar branch occlusion.  There is a 3 mm wide necked outpouching from the left posterior communicating origin at its junction  with the left internal carotid artery.  This could represent a small aneurysm although an infundibulum could have this appearance on this motion degraded examination.  Consider 37-month MRA  follow-up.  IMPRESSION: Mild intracranial atherosclerotic changes as described.  No proximal flow reducing lesion.  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  Consider 6- month MRA follow-up.   Original Report Authenticated By: Davonna Belling, M.D.   Mr Laqueta Jean Wo Contrast  01/14/2013   *RADIOLOGY REPORT*  Clinical Data:  34 old female with hypertension and chronic fatigue syndrome having frequent episodes of dizziness.  Presented to the ED with right-sided weakness  that developed 01/11/2013. Stroke risk factors include hypertension.  MRI HEAD WITHOUT AND WITH CONTRAST MRA HEAD WITHOUT CONTRAST  Technique:  Multiplanar, multiecho pulse sequences of the brain and surrounding structures were obtained without and with intravenous contrast.  Angiographic images of the head were obtained using MRA technique without contrast.  Contrast: 20mL MULTIHANCE GADOBENATE DIMEGLUMINE 529 MG/ML IV SOLN  Comparison:   CT head 01/14/2013.  MRI HEAD WITHOUT AND WITH CONTRAST  Findings:  The patient had difficulty remaining motionless for the study.  Images are suboptimal.  Small or subtle lesions could be overlooked.  There is no acute stroke or hemorrhage.  There is no mass lesion or hydrocephalus.  There is no extra-axial fluid.  Normal cerebral volume.  Increased signal in the subcortical and periventricular white matter, consistent with hypertensive related chronic microvascular ischemic change.  No signs of hypertensive encephalopathy.  No foci of chronic hemorrhage.  No midline shift.  Unremarkable calvarium and skull base.  No visible cervical spine abnormality.  Normal pituitary.  Mild tonsillar ectopia without definite Chiari I malformation.  Post contrast, there is no definite abnormal intracranial enhancement. No acute  orbital, sinus, or mastoid abnormality.  Small intraparotid cyst superficial lobe on the left.  IMPRESSION: Premature white matter changes, consistent with chronic microvascular ischemic change related hypertension.  No acute intracranial findings.  No acute stroke or bleed.  MRA HEAD  Findings: The right internal carotid artery is dolichoectatic but widely patent.  The left internal carotid artery is dolichoectatic and displays shallow inferior cavernous outpouching, not frankly aneurysmal.  There is mild irregularity in the superior cavernous segment LICA with possible 50% stenosis.  The basilar artery is widely patent.  Both vertebrals contribute with a 50-75% stenosis of the distal right vertebral.  There is a fetal origin of the right posterior cerebral artery directly from the carotid.  Left PCA originates from the basilar tip.  Mild nonstenotic irregularity proximal right anterior cerebral artery.  Normal proximal left ACA.  Distal anterior cerebral arteries are unremarkable.  No proximal middle cerebral artery stenosis.  Mild irregularity of the distal middle cerebral artery branches without focal branch occlusion.  No PCA disease.  No cerebellar branch occlusion.  There is a 3 mm wide necked outpouching from the left posterior communicating origin at its junction with the left internal carotid artery.  This could represent a small aneurysm although an infundibulum could have this appearance on this motion degraded examination.  Consider 23-month MRA  follow-up.  IMPRESSION: Mild intracranial atherosclerotic changes as described.  No proximal flow reducing lesion.  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  Consider 6- month MRA follow-up.   Original Report Authenticated By: Davonna Belling, M.D.    Medications:  I have reviewed the patient's current medications. Scheduled: . aspirin  300 mg Rectal Daily   Or  . aspirin  325 mg Oral Daily  . atorvastatin  20 mg Oral q1800  . fluticasone  1  puff Inhalation BID  . heparin  5,000 Units Subcutaneous Q8H   ZOX:[id number], acetaminophen, albuterol, hydrALAZINE, senna-docusate  Assessment/Plan: Etiology for symptoms involving right arm and leg remains unclear. Patient has no clinical signs of acute stroke, nor any imaging evidence of acute stroke per MRI. Significance of MRA findings possible small posterior communicating artery aneurysm versus infundibulum, is unclear. Repeat MRI for comparison in 6 months recommended. I suspect there are significant psychogenic factors contributing to patient's presenting symptoms involving right extremities.  No further neurodiagnostic studies  are indicated at this point and no further neurological intervention is warranted. I agree with need for home health intervention per rehabilitation service recommendation. Recommend continuing aspirin daily or stroke in MI prophylaxis. I will sign off on her care at this point but remain available for reevaluation if clinically indicated.  C.R. Roseanne Reno, MD [facility] [phone]  01/16/2013  8:41 AM
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
## 2013-01-16 NOTE — Progress Notes (Signed)
FMTS Attending Admission Note: Yesenia Eniola,MD I  have seen and examined this patient, reviewed their chart. I have discussed this patient with the resident. I agree with the resident's findings, assessment and care plan.  

## 2013-01-16 NOTE — Discharge Summary (Signed)
FMTS Attending Admission Note: Corbitt Cloke,MD I  have seen and examined this patient, reviewed their chart. I have discussed this patient with the resident. I agree with the resident's findings, assessment and care plan.  

## 2013-01-23 ENCOUNTER — Emergency Department (HOSPITAL_COMMUNITY): Payer: Medicaid Other

## 2013-01-23 ENCOUNTER — Encounter (HOSPITAL_COMMUNITY): Payer: Self-pay

## 2013-01-23 ENCOUNTER — Inpatient Hospital Stay (HOSPITAL_COMMUNITY)
Admission: EM | Admit: 2013-01-23 | Discharge: 2013-01-25 | DRG: 683 | Disposition: A | Discharge status: Home / Self Care | Payer: Medicaid Other | Attending: Internal Medicine | Admitting: Internal Medicine

## 2013-01-23 DIAGNOSIS — N179 Acute kidney failure, unspecified: Principal | ICD-10-CM | POA: Diagnosis present

## 2013-01-23 DIAGNOSIS — E785 Hyperlipidemia, unspecified: Secondary | ICD-10-CM | POA: Diagnosis present

## 2013-01-23 DIAGNOSIS — Z9181 History of falling: Secondary | ICD-10-CM

## 2013-01-23 DIAGNOSIS — A498 Other bacterial infections of unspecified site: Secondary | ICD-10-CM | POA: Diagnosis present

## 2013-01-23 DIAGNOSIS — D72829 Elevated white blood cell count, unspecified: Secondary | ICD-10-CM

## 2013-01-23 DIAGNOSIS — J45909 Unspecified asthma, uncomplicated: Secondary | ICD-10-CM | POA: Diagnosis present

## 2013-01-23 DIAGNOSIS — R531 Weakness: Secondary | ICD-10-CM

## 2013-01-23 DIAGNOSIS — Z79899 Other long term (current) drug therapy: Secondary | ICD-10-CM

## 2013-01-23 DIAGNOSIS — M1710 Unilateral primary osteoarthritis, unspecified knee: Secondary | ICD-10-CM | POA: Diagnosis present

## 2013-01-23 DIAGNOSIS — E860 Dehydration: Secondary | ICD-10-CM

## 2013-01-23 DIAGNOSIS — I10 Essential (primary) hypertension: Secondary | ICD-10-CM

## 2013-01-23 DIAGNOSIS — Z8673 Personal history of transient ischemic attack (TIA), and cerebral infarction without residual deficits: Secondary | ICD-10-CM

## 2013-01-23 DIAGNOSIS — I152 Hypertension secondary to endocrine disorders: Secondary | ICD-10-CM | POA: Diagnosis present

## 2013-01-23 DIAGNOSIS — M1711 Unilateral primary osteoarthritis, right knee: Secondary | ICD-10-CM

## 2013-01-23 DIAGNOSIS — R5381 Other malaise: Secondary | ICD-10-CM | POA: Diagnosis present

## 2013-01-23 DIAGNOSIS — R296 Repeated falls: Secondary | ICD-10-CM

## 2013-01-23 DIAGNOSIS — N390 Urinary tract infection, site not specified: Secondary | ICD-10-CM | POA: Diagnosis present

## 2013-01-23 HISTORY — DX: Cerebral infarction, unspecified: I63.9

## 2013-01-23 LAB — CBC WITH DIFFERENTIAL/PLATELET
Basophils Relative: 1 % (ref 0–1)
Eosinophils Absolute: 0.3 10*3/uL (ref 0.0–0.7)
Eosinophils Relative: 3 % (ref 0–5)
Hemoglobin: 14.4 g/dL (ref 12.0–15.0)
MCH: 28.7 pg (ref 26.0–34.0)
MCHC: 33.1 g/dL (ref 30.0–36.0)
Monocytes Relative: 7 % (ref 3–12)
Neutrophils Relative %: 58 % (ref 43–77)

## 2013-01-23 LAB — POCT I-STAT, CHEM 8
Calcium, Ion: 1.09 mmol/L — ABNORMAL LOW (ref 1.12–1.23)
Glucose, Bld: 113 mg/dL — ABNORMAL HIGH (ref 70–99)
HCT: 47 % — ABNORMAL HIGH (ref 36.0–46.0)
Hemoglobin: 16 g/dL — ABNORMAL HIGH (ref 12.0–15.0)
Potassium: 3.6 mEq/L (ref 3.5–5.1)
TCO2: 29 mmol/L (ref 0–100)

## 2013-01-23 MED ORDER — ALPRAZOLAM 1 MG PO TABS
1.0000 mg | ORAL_TABLET | Freq: Three times a day (TID) | ORAL | Status: DC | PRN
  Administered 2013-01-24: 1 mg via ORAL
  Filled 2013-01-23: qty 1

## 2013-01-23 MED ORDER — HYDROCODONE-ACETAMINOPHEN 5-325 MG PO TABS
1.0000 | ORAL_TABLET | Freq: Four times a day (QID) | ORAL | Status: DC | PRN
  Administered 2013-01-24: 1 via ORAL
  Filled 2013-01-23: qty 1

## 2013-01-23 MED ORDER — SODIUM CHLORIDE 0.9 % IV BOLUS (SEPSIS)
1000.0000 mL | Freq: Once | INTRAVENOUS | Status: AC
  Administered 2013-01-24: 1000 mL via INTRAVENOUS

## 2013-01-23 MED ORDER — ALBUTEROL SULFATE HFA 108 (90 BASE) MCG/ACT IN AERS: 2.0000 | INHALATION_SPRAY | Freq: Four times a day (QID) | RESPIRATORY_TRACT | Status: DC | PRN

## 2013-01-23 MED ORDER — HYDROCHLOROTHIAZIDE 25 MG PO TABS
25.0000 mg | ORAL_TABLET | Freq: Every day | ORAL | Status: DC
  Filled 2013-01-23: qty 1

## 2013-01-23 MED ORDER — SODIUM CHLORIDE 0.9 % IV BOLUS (SEPSIS): 500.0000 mL | Freq: Once | INTRAVENOUS | Status: DC

## 2013-01-23 MED ORDER — FUROSEMIDE 20 MG PO TABS
20.0000 mg | ORAL_TABLET | Freq: Every day | ORAL | Status: DC
  Filled 2013-01-23: qty 1

## 2013-01-23 MED ORDER — FLUTICASONE PROPIONATE HFA 44 MCG/ACT IN AERO
1.0000 | INHALATION_SPRAY | Freq: Two times a day (BID) | RESPIRATORY_TRACT | Status: DC
  Administered 2013-01-23 – 2013-01-25 (×4): 1 via RESPIRATORY_TRACT
  Filled 2013-01-23: qty 10.6

## 2013-01-23 MED ORDER — ASPIRIN EC 81 MG PO TBEC
81.0000 mg | DELAYED_RELEASE_TABLET | Freq: Every day | ORAL | Status: DC
  Administered 2013-01-24 – 2013-01-25 (×2): 81 mg via ORAL
  Filled 2013-01-23 (×2): qty 1

## 2013-01-23 MED ORDER — ATORVASTATIN CALCIUM 20 MG PO TABS
20.0000 mg | ORAL_TABLET | Freq: Every day | ORAL | Status: DC
  Administered 2013-01-24 – 2013-01-25 (×2): 20 mg via ORAL
  Filled 2013-01-23 (×2): qty 1

## 2013-01-23 NOTE — ED Provider Notes (Signed)
History    CSN: [id number] Arrival date & time 01/23/13  1731  First MD Initiated Contact with Patient 01/23/13 1740     Chief Complaint  Patient presents with  . Fall  . Leg Pain    right  . Knee Pain    right   (Consider location/radiation/quality/duration/timing/severity/associated sxs/prior Treatment) HPI Comments: Patient presents to the emergency department with chief complaint of fall. She states that she was recently seen and treated for stroke, which left her with right-sided weakness. She states that the right-sided weakness has caused her to fall many times. She states that she fell again today and hurt her knee. She states that she has been involved in home health and physical therapy, but she states that it is not helping. She states the pain is mild to moderate. She states that she also feels out of energy and weak. She reports a history of anemia and hypokalemia. She denies any chest pain, shortness of breath, nausea, vomiting, diarrhea, or constipation. Denies any new numbness or tingling of the extremities.  The history is provided by the patient. No language interpreter was used.   Past Medical History  Diagnosis Date  . Hypertension   . Stroke    Past Surgical History  Procedure Laterality Date  . Leg surgery Right 2007    Surgery x3   Family History  Problem Relation Age of Onset  . Leukemia Brother    History  Substance Use Topics  . Smoking status: Not on file  . Smokeless tobacco: Not on file  . Alcohol Use: Not on file   OB History   Grav Para Term Preterm Abortions TAB SAB Ect Mult Living                 Review of Systems  All other systems reviewed and are negative.    Allergies  Morphine and related  Home Medications   Current Outpatient Rx  Name  Route  Sig  Dispense  Refill  . albuterol (PROVENTIL HFA;VENTOLIN HFA) 108 (90 BASE) MCG/ACT inhaler   Inhalation   Inhale 2 puffs into the lungs every 6 (six) hours as needed for  wheezing.         Marland Kitchen ALPRAZolam (XANAX) 1 MG tablet   Oral   Take 1 mg by mouth 3 (three) times daily as needed for sleep or anxiety.         Marland Kitchen aspirin EC 81 MG tablet   Oral   Take 81 mg by mouth daily.         Marland Kitchen atorvastatin (LIPITOR) 20 MG tablet   Oral   Take 1 tablet (20 mg total) by mouth daily.   30 tablet   0   . beclomethasone (QVAR) 80 MCG/ACT inhaler   Inhalation   Inhale 1 puff into the lungs 2 (two) times daily as needed (for wheezing).          . furosemide (LASIX) 20 MG tablet   Oral   Take 20 mg by mouth daily.         . hydrochlorothiazide (HYDRODIURIL) 25 MG tablet   Oral   Take 25 mg by mouth daily.         Marland Kitchen HYDROcodone-acetaminophen (NORCO/VICODIN) 5-325 MG per tablet   Oral   Take 1 tablet by mouth every 6 (six) hours as needed for pain.          BP 122/81  Pulse 103  Temp(Src) 98.2 F (36.8 C) (Oral)  Resp 16  SpO2 99% Physical Exam  Nursing note and vitals reviewed. Constitutional: She is oriented to person, place, and time. She appears well-developed and well-nourished.  HENT:  Head: Normocephalic and atraumatic.  Eyes: Conjunctivae and EOM are normal. Pupils are equal, round, and reactive to light.  Neck: Normal range of motion. Neck supple.  Cardiovascular: Normal rate, regular rhythm and intact distal pulses.  Exam reveals no gallop and no friction rub.   No murmur heard. Pulmonary/Chest: Effort normal and breath sounds normal. No respiratory distress. She has no wheezes. She has no rales. She exhibits no tenderness.  Abdominal: Soft. Bowel sounds are normal. She exhibits no distension and no mass. There is no tenderness. There is no rebound and no guarding.  Musculoskeletal: Normal range of motion. She exhibits no edema and no tenderness.  Right knee is mildly tender to palpation over the lateral aspect, no bony abnormalities or deformities  Neurological: She is alert and oriented to person, place, and time.  Skin: Skin is  warm and dry.  Scattered contusions to the right knee, and bilateral elbows  Psychiatric: She has a normal mood and affect. Her behavior is normal. Judgment and thought content normal.    ED Course  Procedures (including critical care time) Labs Reviewed  CBC WITH DIFFERENTIAL   No results found. No diagnosis found.  MDM  Patient with repeated falls following CVA.  She has home health, but lives in a hotel, and I believe that she needs additional help, and would benefit from an inpatient facility.  I have discussed this patient with social work, who will see the patient and will seek to get her placed.  Discussed patient with Dr. Jodi Mourning  8:18 PM Social work has still not seen the patient, but they state that they will. Patient was signed out to oncoming mid-level provider, [place], [place], who will continue care.  Roxy Horseman, PA-C 01/23/13 2019  Medical screening examination/treatment/procedure(s) were conducted as a shared visit with non-physician practitioner(s) or resident  and myself.  I personally evaluated the patient during the encounter and agree with the findings and plan unless otherwise indicated.  Pt unable to care for self at home since stroke, clinically Dx: dehydrated, frequent falls, weakness.   BUN elevated, ARF.  Pt likely needs assisted or NH.  Fluid boluses given. No social worker available at this time.  Pt urinated without sample, pending. Paged hospitalist. Signed out to fup ua and admit.   Dr Toniann Fail accepted for observation.     Enid Skeens, MD 01/24/13 [phone]

## 2013-01-23 NOTE — ED Notes (Signed)
Home PT present when EMS arrived

## 2013-01-23 NOTE — ED Notes (Signed)
Pt presents with NAD Per EMS Fall last night c.o of rt leg and knee pain- sp stroke last week seen and treated for this - rt sided weakness sp since this event. SP also HX of the same for 5 months.  Neg for new symptoms

## 2013-01-23 NOTE — Progress Notes (Addendum)
CSW met with pt at bedside. Pt lives alone. Pt has frequent fall and no family support.  EDP is requesting SNF placement.  CSW initiating placement and will follow up tomorrow.  Marva Panda, LCSWA  [phone]   01/23/13 7:30 pm

## 2013-01-23 NOTE — ED Notes (Signed)
Pt was not able to obtained urine for staff

## 2013-01-23 NOTE — ED Notes (Signed)
[id number]<AV> Expected date:<BR> Expected time:<BR> Means of arrival:<BR> Comments:<BR> EMS-fall right leg pain

## 2013-01-24 ENCOUNTER — Encounter (HOSPITAL_COMMUNITY): Payer: Self-pay | Admitting: Internal Medicine

## 2013-01-24 DIAGNOSIS — E860 Dehydration: Secondary | ICD-10-CM

## 2013-01-24 DIAGNOSIS — Z9181 History of falling: Secondary | ICD-10-CM

## 2013-01-24 DIAGNOSIS — N390 Urinary tract infection, site not specified: Secondary | ICD-10-CM

## 2013-01-24 DIAGNOSIS — N179 Acute kidney failure, unspecified: Principal | ICD-10-CM

## 2013-01-24 LAB — COMPREHENSIVE METABOLIC PANEL
Alkaline Phosphatase: 103 U/L (ref 39–117)
BUN: 37 mg/dL — ABNORMAL HIGH (ref 6–23)
CO2: 29 mEq/L (ref 19–32)
Calcium: 9.2 mg/dL (ref 8.4–10.5)
GFR calc Af Amer: 52 mL/min — ABNORMAL LOW (ref >90)
GFR calc non Af Amer: 45 mL/min — ABNORMAL LOW (ref >90)
Glucose, Bld: 134 mg/dL — ABNORMAL HIGH (ref 70–99)
Potassium: 3.1 mEq/L — ABNORMAL LOW (ref 3.5–5.1)
Total Protein: 7.3 g/dL (ref 6.0–8.3)

## 2013-01-24 LAB — CBC WITH DIFFERENTIAL/PLATELET
Eosinophils Absolute: 0.3 10*3/uL (ref 0.0–0.7)
Eosinophils Relative: 2 % (ref 0–5)
HCT: 38.6 % (ref 36.0–46.0)
Hemoglobin: 12.6 g/dL (ref 12.0–15.0)
Lymphocytes Relative: 34 % (ref 12–46)
Lymphs Abs: 3.8 10*3/uL (ref 0.7–4.0)
MCH: 28.3 pg (ref 26.0–34.0)
MCV: 86.5 fL (ref 78.0–100.0)
Monocytes Absolute: 0.9 10*3/uL (ref 0.1–1.0)
Monocytes Relative: 8 % (ref 3–12)
Platelets: 355 10*3/uL (ref 150–400)
RBC: 4.46 MIL/uL (ref 3.87–5.11)
WBC: 11.1 10*3/uL — ABNORMAL HIGH (ref 4.0–10.5)

## 2013-01-24 LAB — URINALYSIS, ROUTINE W REFLEX MICROSCOPIC
Bilirubin Urine: NEGATIVE
Glucose, UA: NEGATIVE mg/dL
Ketones, ur: NEGATIVE mg/dL
Protein, ur: NEGATIVE mg/dL
Urobilinogen, UA: 0.2 mg/dL (ref 0.0–1.0)

## 2013-01-24 MED ORDER — ACETAMINOPHEN 325 MG PO TABS: 650.0000 mg | ORAL_TABLET | Freq: Four times a day (QID) | ORAL | Status: DC | PRN

## 2013-01-24 MED ORDER — ONDANSETRON HCL 4 MG PO TABS: 4.0000 mg | ORAL_TABLET | Freq: Four times a day (QID) | ORAL | Status: DC | PRN

## 2013-01-24 MED ORDER — SODIUM CHLORIDE 0.9 % IV SOLN
INTRAVENOUS | Status: AC
  Administered 2013-01-24: 18:00:00 via INTRAVENOUS
  Administered 2013-01-24: 1000 mL via INTRAVENOUS

## 2013-01-24 MED ORDER — ADULT MULTIVITAMIN W/MINERALS CH
1.0000 | ORAL_TABLET | Freq: Every day | ORAL | Status: DC
  Administered 2013-01-24 – 2013-01-25 (×2): 1 via ORAL
  Filled 2013-01-24 (×2): qty 1

## 2013-01-24 MED ORDER — ACETAMINOPHEN 650 MG RE SUPP: 650.0000 mg | Freq: Four times a day (QID) | RECTAL | Status: DC | PRN

## 2013-01-24 MED ORDER — ONDANSETRON HCL 4 MG/2ML IJ SOLN
4.0000 mg | Freq: Four times a day (QID) | INTRAMUSCULAR | Status: DC | PRN
  Administered 2013-01-24: 4 mg via INTRAVENOUS
  Filled 2013-01-24: qty 2

## 2013-01-24 MED ORDER — DEXTROSE 5 % IV SOLN
1.0000 g | Freq: Every day | INTRAVENOUS | Status: DC
  Administered 2013-01-24 – 2013-01-25 (×2): 1 g via INTRAVENOUS
  Filled 2013-01-24 (×2): qty 10

## 2013-01-24 MED ORDER — ENOXAPARIN SODIUM 40 MG/0.4ML ~~LOC~~ SOLN
40.0000 mg | SUBCUTANEOUS | Status: DC
  Administered 2013-01-24 – 2013-01-25 (×2): 40 mg via SUBCUTANEOUS
  Filled 2013-01-24 (×2): qty 0.4

## 2013-01-24 MED ORDER — SODIUM CHLORIDE 0.9 % IV BOLUS (SEPSIS)
1000.0000 mL | Freq: Once | INTRAVENOUS | Status: AC
  Administered 2013-01-24: 1000 mL via INTRAVENOUS

## 2013-01-24 NOTE — Progress Notes (Signed)
Pt admitted from ED. Pt orientated to the unit all vital signs stable. Pt safety video viewed. Pt had no questions or concerns at this time. No complaints of pain. On-call MD paged upon arrival.

## 2013-01-24 NOTE — Progress Notes (Addendum)
Patient seen and examined. Admitted after midnight secondary to generalized weakness, UTI and ARF(acute renal failure). Patient with hx of TIA, recently admitted to the hospital for that and ended returning home with HHPT. Unfortunately this has not been enough. Had also hx of right knee degenerative changes which are contributing to her high risk for falls. On home medications has noticed she was taking 2 diuretics and patient endorses she has not been able to eat and drink properly. Please referred to Dr. Katherene Ponto H&P for further info/details on admission.  Plan: -continue supportive care and IVF's -discontinue/avoid nephrotoxic agents -continue rocephin and follow urine cx's -Will follow PT recommendations  -SW consulted; as patient will need SNF placement at discharge.  Prosperity Darrough [phone]

## 2013-01-24 NOTE — Progress Notes (Signed)
INITIAL NUTRITION ASSESSMENT  DOCUMENTATION CODES Per approved criteria  -Morbid Obesity   INTERVENTION: - Used teach back method to educate pt on heart healthy diet. Discussed sources of cholesterol and sodium in diet and recommended meal plans. Handouts provided with RD contact information. Pt expressed understanding, expect good compliance - Recommend MD replace potassium. Will order daily multivitamin.  - Encouraged increased PO intake - Will continue to monitor   NUTRITION DIAGNOSIS: Predicted suboptimal energy intake related to poor appetite PTA as evidenced by pt report.   Goal: Pt to consume >90% of meals  Monitor:  Weights, labs, intake  Reason for Assessment: Nutrition risk   52 y.o. female  Admitting Dx: ARF (acute renal failure)  ASSESSMENT: Pt with recent TIA earlier this month, admitted with right leg and knee pain from right sided weakness. Met with pt who reports from discharge at [facility] 01/16/13 until now she has had anything to eat other than a few bites of peaches 1 day earlier in the week. Pt with no appetite. Pt reports before past admission she had a good appetite and was eating well, 3 meals/day. Pt not aware of any weight loss. Pt reports eating well today, 100% of lunch. Noted pt with low potassium, elevated BUN/Cr, and low GFR. Pt with acute renal failure.   Height: Ht Readings from Last 1 Encounters:  01/14/13 5\' 6"  (1.676 m)    Weight: Wt Readings from Last 1 Encounters:  01/16/13 335 lb 4.8 oz (152.091 kg)    Ideal Body Weight: 130 lb  % Ideal Body Weight: 258%  Wt Readings from Last 10 Encounters:  01/16/13 335 lb 4.8 oz (152.091 kg)    Usual Body Weight: 335 lb   % Usual Body Weight:100%  BMI:  54 kg/(m^2).  Class III extreme obesity  Estimated Nutritional Needs: Kcal: 1500-1650 Protein: 60-70g Fluid: 1.5-1.6L/day  Skin: Intact   Diet Order: Cardiac  EDUCATION NEEDS: -Education needs addressed - discussed heart healthy  diet in detail and provided handouts of this information  No intake or output data in the 24 hours ending 01/24/13 1550  Last BM: 7/9   Labs:   Recent Labs Lab 01/23/13 1859 01/24/13 0540  NA 143 143  K 3.6 3.1*  CL 103 102  CO2  --  29  BUN 46* 37*  CREATININE 1.60* 1.35*  CALCIUM  --  9.2  GLUCOSE 113* 134*    CBG (last 3)  No results found for this basename: GLUCAP,  in the last 72 hours  Scheduled Meds: . aspirin EC  81 mg Oral Daily  . atorvastatin  20 mg Oral Daily  . cefTRIAXone (ROCEPHIN)  IV  1 g Intravenous Q0600  . enoxaparin (LOVENOX) injection  40 mg Subcutaneous Q24H  . fluticasone  1 puff Inhalation BID    Continuous Infusions: . sodium chloride 1,000 mL (01/24/13 0542)    Past Medical History  Diagnosis Date  . Hypertension   . Stroke     Past Surgical History  Procedure Laterality Date  . Leg surgery Right 2007    Surgery x3     Levon Hedger MS, RD, [place] [phone] Pager [phone] After Hours Pager
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
## 2013-01-24 NOTE — Clinical Social Work Psychosocial (Signed)
     Clinical Social Work Department BRIEF PSYCHOSOCIAL ASSESSMENT 01/24/2013  Patient:  Yesenia [name], Yesenia [name]     Account Number:  [id number]     Admit date:  01/23/2013  Clinical Social Worker:  Hattie Perch  Date/Time:  01/24/2013 12:00 M  Referred by:  Physician  Date Referred:  01/24/2013 Referred for  SNF Placement   Other Referral:   Interview type:  Patient Other interview type:    PSYCHOSOCIAL DATA Living Status:  ALONE Admitted from facility:   Level of care:   Primary support name:  [place] Primary support relationship to patient:  CHILD, ADULT Degree of support available:   good    CURRENT CONCERNS Current Concerns  Post-Acute Placement   Other Concerns:    SOCIAL WORK ASSESSMENT / PLAN CSW met with patient. patient is alert and oriented X3. patient in need of snf placement. patient reports she was living in a hotel prior to coming to the hospital. CSW explained snf process. CSW explained with patient having medicaid only that there are limited facilities and that they will require her to also give over most of her social security check. patient was upset about this but understanding. she continues to be agreeable to snf search. CSW explained that if snf bed could not be found, she may have to continue to look for placement from home since patient should be cleared tomorrow. patient very understanding. she called her daughter and asked that CSW explained procedure to her daughter, Darreld Mclean, as well. CSW did same. CSW will follow for any bed offers and choice.   Assessment/plan status:   Other assessment/ plan:   Information/referral to community resources:    PATIENTS/FAMILYS RESPONSE TO PLAN OF CARE: patient is sad that she needs snf placement but understanding of need. Patient is very hopeful that she will not have to be in a snf forever and that after a few months that she should be able to return home.

## 2013-01-24 NOTE — H&P (Signed)
[facility] History and Physical  Araya Roel ONG:[id number] DOB: 14-Dec-1960 DOA: 01/23/2013  Referring physician: ER physician. PCP: Egbert Garibaldi, NP   Chief Complaint: Weakness and falls.  HPI: Yesenia [name] is a 52 y.o. female who was recently admitted for right-sided hemiparesis and at that time MRI brain was negative for any acute findings and a diagnosis of TIA was made presented to the ER because of increasing falls and weakness. Patient has been staying in hotel and physical therapy had gone to visit when patient was found to be having increasing falls. Patient denies any nausea vomiting chest pain shortness of breath diarrhea fever chills. In the ER patient's labs showed increasing creatinine and UA shows possible UTI has been admitted for further management.  Review of Systems: As presented in the history of presenting illness, rest negative.  Past Medical History  Diagnosis Date  . Hypertension   . Stroke    Past Surgical History  Procedure Laterality Date  . Leg surgery Right 2007    Surgery x3   Social History:  has no tobacco, alcohol, and drug history on file. Lives in a hotel. where does patient live-- Can do ADLs. Can patient participate in ADLs?  Allergies  Allergen Reactions  . Morphine And Related     Unknown    Family History  Problem Relation Age of Onset  . Leukemia Brother       Prior to Admission medications   Medication Sig Start Date End Date Taking? Authorizing Provider  albuterol (PROVENTIL HFA;VENTOLIN HFA) 108 (90 BASE) MCG/ACT inhaler Inhale 2 puffs into the lungs every 6 (six) hours as needed for wheezing.   Yes Historical Provider, MD  ALPRAZolam Prudy Feeler) 1 MG tablet Take 1 mg by mouth 3 (three) times daily as needed for sleep or anxiety.   Yes Historical Provider, MD  aspirin EC 81 MG tablet Take 81 mg by mouth daily.   Yes Historical Provider, MD  atorvastatin (LIPITOR) 20 MG tablet Take 1 tablet (20 mg total) by mouth  daily. 01/16/13  Yes Hazeline Junker, MD  beclomethasone (QVAR) 80 MCG/ACT inhaler Inhale 1 puff into the lungs 2 (two) times daily as needed (for wheezing).    Yes Historical Provider, MD  furosemide (LASIX) 20 MG tablet Take 20 mg by mouth daily.   Yes Historical Provider, MD  hydrochlorothiazide (HYDRODIURIL) 25 MG tablet Take 25 mg by mouth daily.   Yes Historical Provider, MD  HYDROcodone-acetaminophen (NORCO/VICODIN) 5-325 MG per tablet Take 1 tablet by mouth every 6 (six) hours as needed for pain.   Yes Historical Provider, MD   Physical Exam: Filed Vitals:   01/23/13 1748 01/23/13 2204 01/24/13 0204 01/24/13 0230  BP: 122/81 101/64 113/99 116/76  Pulse: 103 103 97 95  Temp: 98.2 F (36.8 C) 98.5 F (36.9 C) 97.9 F (36.6 C) 98 F (36.7 C)  TempSrc: Oral Oral Oral Oral  Resp: 16 20 20 16   SpO2: 99% 97% 97% 95%     General:  Well-developed and nourished.  Eyes: Anicteric no pallor.  ENT: No discharge from ears eyes nose mouth.  Neck: No mass felt.  Cardiovascular: S1-S2 heard.  Respiratory: No rhonchi or crepitations.  Abdomen: Soft nontender bowel sounds present.  Skin: No rash.  Musculoskeletal: No edema.  Psychiatric: Appears normal.  Neurologic: Alert awake oriented to time place and person. Right upper and lower extremity 4 x 5 strength. Left upper and lower extremity 5 x 5 in strength. No facial symmetry.  Labs  on Admission:  Basic Metabolic Panel:  Recent Labs Lab 01/23/13 1859  NA 143  K 3.6  CL 103  GLUCOSE 113*  BUN 46*  CREATININE 1.60*   Liver Function Tests: No results found for this basename: AST, ALT, ALKPHOS, BILITOT, PROT, ALBUMIN,  in the last 168 hours No results found for this basename: LIPASE, AMYLASE,  in the last 168 hours No results found for this basename: AMMONIA,  in the last 168 hours CBC:  Recent Labs Lab 01/23/13 1855 01/23/13 1859  WBC 11.4*  --   NEUTROABS 6.6  --   HGB 14.4 16.0*  HCT 43.5 47.0*  MCV 86.8  --    PLT 373  --    Cardiac Enzymes: No results found for this basename: CKTOTAL, CKMB, CKMBINDEX, TROPONINI,  in the last 168 hours  BNP (last 3 results) No results found for this basename: PROBNP,  in the last 8760 hours CBG: No results found for this basename: GLUCAP,  in the last 168 hours  Radiological Exams on Admission: Dg Knee Complete 4 Views Right  01/23/2013   *RADIOLOGY REPORT*  Clinical Data: Right knee pain and weakness.  RIGHT KNEE - COMPLETE 4+ VIEW  Comparison: None.  Findings: Degenerative changes in the knee compartments.  Prominent osteophytes involving the lateral knee compartment.  No evidence for a fracture or dislocation.  Difficult to evaluate for a suprapatellar joint effusion.  IMPRESSION: Moderate osteoarthritis of the right knee.  No acute bony abnormality.   Original Report Authenticated By: Richarda Overlie, M.D.     Assessment/Plan Principal Problem:   ARF (acute renal failure) Active Problems:   HTN (hypertension)   UTI (lower urinary tract infection)   Dehydration   1. ARF probably secondary to dehydration - continue with gentle hydration closely follow metabolic panel and intake and output. 2. Possible UTI - patient has been placed on ceftriaxone. Follow urine cultures. 3. Frequent falls - physical therapy consult and social worker consult. 4. Hypertension - continue present medications. 5. Recent admission for TIA.     Code Status: Full code.  Family Communication: None.  Disposition Plan: Admit to inpatient.    KAKRAKANDY,ARSHAD N. [facility] Pager [phone].  If 7PM-7AM, please contact night-coverage [email or web address] Password [facility] 01/24/2013, 4:45 AM

## 2013-01-25 DIAGNOSIS — R5383 Other fatigue: Secondary | ICD-10-CM

## 2013-01-25 DIAGNOSIS — IMO0002 Reserved for concepts with insufficient information to code with codable children: Secondary | ICD-10-CM

## 2013-01-25 DIAGNOSIS — R5381 Other malaise: Secondary | ICD-10-CM

## 2013-01-25 DIAGNOSIS — M1710 Unilateral primary osteoarthritis, unspecified knee: Secondary | ICD-10-CM

## 2013-01-25 DIAGNOSIS — I10 Essential (primary) hypertension: Secondary | ICD-10-CM

## 2013-01-25 LAB — BASIC METABOLIC PANEL
BUN: 21 mg/dL (ref 6–23)
CO2: 27 mEq/L (ref 19–32)
Calcium: 8.8 mg/dL (ref 8.4–10.5)
Creatinine, Ser: 1.05 mg/dL (ref 0.50–1.10)
GFR calc non Af Amer: 60 mL/min — ABNORMAL LOW (ref >90)
Glucose, Bld: 228 mg/dL — ABNORMAL HIGH (ref 70–99)

## 2013-01-25 LAB — URINE CULTURE: Colony Count: >=100000

## 2013-01-25 MED ORDER — POLYETHYLENE GLYCOL 3350 17 G PO PACK
17.0000 g | PACK | Freq: Every day | ORAL | Status: DC
  Administered 2013-01-25: 17 g via ORAL
  Filled 2013-01-25: qty 1

## 2013-01-25 MED ORDER — AMLODIPINE BESYLATE 2.5 MG PO TABS: 2.5000 mg | ORAL_TABLET | Freq: Every day | ORAL | Status: DC

## 2013-01-25 MED ORDER — HYDROCODONE-ACETAMINOPHEN 5-325 MG PO TABS: 1.0000 | ORAL_TABLET | Freq: Four times a day (QID) | ORAL | Status: DC | PRN

## 2013-01-25 MED ORDER — DOCUSATE SODIUM 50 MG PO CAPS
50.0000 mg | ORAL_CAPSULE | Freq: Two times a day (BID) | ORAL | Status: DC
  Administered 2013-01-25: 50 mg via ORAL
  Filled 2013-01-25 (×2): qty 1

## 2013-01-25 MED ORDER — DOCUSATE SODIUM 50 MG PO CAPS: 50.0000 mg | ORAL_CAPSULE | Freq: Two times a day (BID) | ORAL | Status: DC

## 2013-01-25 MED ORDER — POLYETHYLENE GLYCOL 3350 17 G PO PACK: 17.0000 g | PACK | Freq: Every day | ORAL | Status: DC

## 2013-01-25 MED ORDER — POTASSIUM CHLORIDE CRYS ER 20 MEQ PO TBCR
40.0000 meq | EXTENDED_RELEASE_TABLET | ORAL | Status: AC
  Administered 2013-01-25: 40 meq via ORAL
  Filled 2013-01-25: qty 2

## 2013-01-25 MED ORDER — ALPRAZOLAM 1 MG PO TABS: 1.0000 mg | ORAL_TABLET | Freq: Three times a day (TID) | ORAL | Status: DC | PRN

## 2013-01-25 MED ORDER — CEFUROXIME AXETIL 500 MG PO TABS: 500.0000 mg | ORAL_TABLET | Freq: Two times a day (BID) | ORAL | Status: AC

## 2013-01-25 NOTE — Progress Notes (Signed)
Patient discharged to [facility] in stable condition.  No change from morning assessment.  Transported via [facility].

## 2013-01-25 NOTE — Progress Notes (Signed)
Patient cleared for discharge. Packet copied and placed in [place]. Patient ecstatic that [facility] is able to accept. ptar called for transport.  Lenna Hagarty C. Linn Clavin MSW, LCSW [phone]

## 2013-01-25 NOTE — Progress Notes (Signed)
Report called to Dewayne Hatch at [facility]

## 2013-01-25 NOTE — Clinical Social Work Placement (Signed)
     Clinical Social Work Department CLINICAL SOCIAL WORK PLACEMENT NOTE 01/25/2013  Patient:  Yesenia [name], Yesenia [name]  Account Number:  [id number] Admit date:  01/23/2013  Clinical Social Worker:  Becky Sax, LCSW  Date/time:  01/25/2013 12:00 M  Clinical Social Work is seeking post-discharge placement for this patient at the following level of care:   SKILLED NURSING   (*CSW will update this form in Epic as items are completed)   01/25/2013  Patient/family provided with [facility] Department of Clinical Social Works list of facilities offering this level of care within the geographic area requested by the patient (or if unable, by the patients family).  01/25/2013  Patient/family informed of their freedom to choose among providers that offer the needed level of care, that participate in Medicare, Medicaid or managed care program needed by the patient, have an available bed and are willing to accept the patient.  01/25/2013  Patient/family informed of [facility] ownership interest in [facility], as well as of the fact that they are under no obligation to receive care at this facility.  PASARR submitted to EDS on 01/25/2013 PASARR number received from EDS on 01/25/2013  FL2 transmitted to all facilities in geographic area requested by pt/family on  01/25/2013 FL2 transmitted to all facilities within larger geographic area on 01/25/2013  Patient informed that his/her managed care company has contracts with or will negotiate with  certain facilities, including the following:     Patient/family informed of bed offers received:  01/25/2013 Patient chooses bed at [facility] Physician recommends and patient chooses bed at  [facility]  Patient to be transferred to [facility] on  01/25/2013 Patient to be transferred to facility by ptar  The following physician request were entered in Epic:   Additional Comments:

## 2013-01-25 NOTE — Progress Notes (Signed)
Patient is interested in [facility]. CSW spoke with [facility], they are able to accept patient today. [place] [employer] auth submitted.  Jarod Bozzo C. Aicha Clingenpeel MSW, LCSW [phone]

## 2013-01-25 NOTE — Discharge Summary (Signed)
Physician Discharge Summary  Yesenia [name] ZOX:[id number] DOB: 10-20-1960 DOA: 01/23/2013  PCP: Egbert Garibaldi, NP  Admit date: 01/23/2013 Discharge date: 01/25/2013  Time spent: >30 minutes  Recommendations for Outpatient Follow-up:  BMET to follow electrolytes and renal function Reassess BP and adjust medications if needed  Discharge Diagnoses:  ARF (acute renal failure) HTN (hypertension) E. Coli UTI (lower urinary tract infection) Dehydration Hx of TIA Physical deconditioning  Degenerative arthritis of right knee HLD Hx of asthma  Discharge Condition: stable and improved. Will discharge to SNF for rehabilitation  Diet recommendation: heart healthy diet  History of present illness:  52 y.o. female who was recently admitted for right-sided hemiparesis and at that time MRI brain was negative for any acute findings and a diagnosis of TIA was made presented to the ER because of increasing falls and weakness. Patient has been staying in hotel and physical therapy had gone to visit when patient was found to be having increasing falls. Patient denies any nausea vomiting chest pain shortness of breath diarrhea fever chills. In the ER patient's labs showed increasing creatinine and UA shows possible UTI has been admitted for further management.   Hospital Course:  1-E. Coli UTI: uncomplicated -no fever and WBC now WNL. -will treat with ceftin BID to complete a total of 7 days course -advise to keep herself hydrated  2-ARF: secondary to dehydration continue use of diuretics and UTI. -renal function now WNL. -diuretics discontinue (patient was taken them for BP control) -advise to keep herself hydrated  3-HTN: stable. -diuretics discontinue. -started on amlodipine -patient advise to follow low sodium heart healthy diet  4-Hx of TIA: -no new focal deficit. -continue ASA for secondary prevention and risk factors modifications  5-HLD: continue statins  6-hx of asthma:  stable and well controlled. Continue home regimen. No wheezing or active SOB.  7-Degenerative arthritis of right knee: chronic. -will use PRN vicodin for pain -patient will follow with orthopedic service as an outpatient  8-Physical deconditioning: will discharge to SNF for PT/OT rehabilitation.   *Rest of medical problems remains stable and the plan is to continue current medication regimen.   Procedures:  See below for x-ray reports  Consultations:  PT  Discharge Exam: Filed Vitals:   01/24/13 1433 01/24/13 2212 01/25/13 0553 01/25/13 0829  BP: 119/77 117/73 111/75   Pulse: 96 96 86   Temp: 97.8 F (36.6 C) 98.2 F (36.8 C) 97.9 F (36.6 C)   TempSrc: Oral Oral Oral   Resp: 18 18 16    SpO2: 96% 97% 93% 94%   General: NAD, afebrile Cardiovascular: S1 and S2, no rubs or gallops Respiratory: CTA bilaterally Abdomen: soft, NT, ND, positive BS Neuro: non focal sensory deficit, CN intact; RLE weakness (mainly due to OA)   Discharge Instructions  Discharge Orders   Future Orders Complete By Expires     Diet - low sodium heart healthy  As directed     Discharge instructions  As directed     Comments:      Take medications as prescribed Keep yourself well hydrated Follow a low sodium heart healthy diet BMET in 5 days to follow electrolytes and kidney function Arrange follow up with PCP in 2 weeks        Medication List    STOP taking these medications       furosemide 20 MG tablet  Commonly known as:  LASIX     hydrochlorothiazide 25 MG tablet  Commonly known as:  HYDRODIURIL  TAKE these medications       albuterol 108 (90 BASE) MCG/ACT inhaler  Commonly known as:  PROVENTIL HFA;VENTOLIN HFA  Inhale 2 puffs into the lungs every 6 (six) hours as needed for wheezing.     ALPRAZolam 1 MG tablet  Commonly known as:  XANAX  Take 1 tablet (1 mg total) by mouth 3 (three) times daily as needed for sleep or anxiety.     amLODipine 2.5 MG tablet   Commonly known as:  NORVASC  Take 1 tablet (2.5 mg total) by mouth daily.     aspirin EC 81 MG tablet  Take 81 mg by mouth daily.     atorvastatin 20 MG tablet  Commonly known as:  LIPITOR  Take 1 tablet (20 mg total) by mouth daily.     beclomethasone 80 MCG/ACT inhaler  Commonly known as:  QVAR  Inhale 1 puff into the lungs 2 (two) times daily as needed (for wheezing).     cefUROXime 500 MG tablet  Commonly known as:  CEFTIN  Take 1 tablet (500 mg total) by mouth 2 (two) times daily. Treatment to be provided for 5 more days. (Last dose 01/30/13)     docusate sodium 50 MG capsule  Commonly known as:  COLACE  Take 1 capsule (50 mg total) by mouth 2 (two) times daily.     HYDROcodone-acetaminophen 5-325 MG per tablet  Commonly known as:  NORCO/VICODIN  Take 1 tablet by mouth every 6 (six) hours as needed for pain.     polyethylene glycol packet  Commonly known as:  MIRALAX / GLYCOLAX  Take 17 g by mouth daily.       Allergies  Allergen Reactions  . Morphine And Related     Unknown       Follow-up Information   Follow up with [name], [name], NP. Schedule an appointment as soon as possible for a visit in 2 weeks.   Contact information:   [facility] [address] [place] [address] [phone]       The results of significant diagnostics from this hospitalization (including imaging, microbiology, ancillary and laboratory) are listed below for reference.    Significant Diagnostic Studies: Ct Head Wo Contrast  01/14/2013   *RADIOLOGY REPORT*  Clinical Data: Left sided headache, dizziness, blurred vision, aphasia.  CT HEAD WITHOUT CONTRAST  Technique:  Contiguous axial images were obtained from the base of the skull through the vertex without contrast.  Comparison: None.  Findings: There is a vague focal low attenuation change in the subcortical white matter of the left posterior parietal/occipital region.  This could represent small  vessel ischemia versus early changes of acute infarct.  There is no mass effect or midline shift.  No ventricular dilatation.  No acute intracranial hemorrhage.  Gray-white matter junctions are distinct.  Basal cisterns are not effaced.  No abnormal extra-axial fluid collections.  No depressed skull fractures.  Visualized paranasal sinuses and mastoid air cells are not opacified.  IMPRESSION: Vague low attenuation change in the subcortical white matter of the left posterior parietal/occipital region may represent early changes of acute infarct versus small vessel ischemic change.  No significant mass effect.  No acute intracranial hemorrhage.   Original Report Authenticated By: Burman Nieves, M.D.   Mr [facility] Wo Contrast  01/14/2013   *RADIOLOGY REPORT*  Clinical Data:  20 old female with hypertension and chronic fatigue syndrome having frequent episodes of dizziness.  Presented to the ED with  right-sided weakness that developed 01/11/2013. Stroke risk factors include hypertension.  MRI HEAD WITHOUT AND WITH CONTRAST MRA HEAD WITHOUT CONTRAST  Technique:  Multiplanar, multiecho pulse sequences of the brain and surrounding structures were obtained without and with intravenous contrast.  Angiographic images of the head were obtained using MRA technique without contrast.  Contrast: 20mL MULTIHANCE GADOBENATE DIMEGLUMINE 529 MG/ML IV SOLN  Comparison:   CT head 01/14/2013.  MRI HEAD WITHOUT AND WITH CONTRAST  Findings:  The patient had difficulty remaining motionless for the study.  Images are suboptimal.  Small or subtle lesions could be overlooked.  There is no acute stroke or hemorrhage.  There is no mass lesion or hydrocephalus.  There is no extra-axial fluid.  Normal cerebral volume.  Increased signal in the subcortical and periventricular white matter, consistent with hypertensive related chronic microvascular ischemic change.  No signs of hypertensive encephalopathy.  No foci of chronic hemorrhage.  No  midline shift.  Unremarkable calvarium and skull base.  No visible cervical spine abnormality.  Normal pituitary.  Mild tonsillar ectopia without definite Chiari I malformation.  Post contrast, there is no definite abnormal intracranial enhancement. No acute orbital, sinus, or mastoid abnormality.  Small intraparotid cyst superficial lobe on the left.  IMPRESSION: Premature white matter changes, consistent with chronic microvascular ischemic change related hypertension.  No acute intracranial findings.  No acute stroke or bleed.  MRA HEAD  Findings: The right internal carotid artery is dolichoectatic but widely patent.  The left internal carotid artery is dolichoectatic and displays shallow inferior cavernous outpouching, not frankly aneurysmal.  There is mild irregularity in the superior cavernous segment LICA with possible 50% stenosis.  The basilar artery is widely patent.  Both vertebrals contribute with a 50-75% stenosis of the distal right vertebral.  There is a fetal origin of the right posterior cerebral artery directly from the carotid.  Left PCA originates from the basilar tip.  Mild nonstenotic irregularity proximal right anterior cerebral artery.  Normal proximal left ACA.  Distal anterior cerebral arteries are unremarkable.  No proximal middle cerebral artery stenosis.  Mild irregularity of the distal middle cerebral artery branches without focal branch occlusion.  No PCA disease.  No cerebellar branch occlusion.  There is a 3 mm wide necked outpouching from the left posterior communicating origin at its junction with the left internal carotid artery.  This could represent a small aneurysm although an infundibulum could have this appearance on this motion degraded examination.  Consider 28-month MRA  follow-up.  IMPRESSION: Mild intracranial atherosclerotic changes as described.  No proximal flow reducing lesion.  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  Consider 6- month MRA follow-up.    Original Report Authenticated By: Davonna Belling, M.D.   Mr Laqueta Jean Wo Contrast  01/14/2013   *RADIOLOGY REPORT*  Clinical Data:  89 old female with hypertension and chronic fatigue syndrome having frequent episodes of dizziness.  Presented to the ED with right-sided weakness that developed 01/11/2013. Stroke risk factors include hypertension.  MRI HEAD WITHOUT AND WITH CONTRAST MRA HEAD WITHOUT CONTRAST  Technique:  Multiplanar, multiecho pulse sequences of the brain and surrounding structures were obtained without and with intravenous contrast.  Angiographic images of the head were obtained using MRA technique without contrast.  Contrast: 20mL MULTIHANCE GADOBENATE DIMEGLUMINE 529 MG/ML IV SOLN  Comparison:   CT head 01/14/2013.  MRI HEAD WITHOUT AND WITH CONTRAST  Findings:  The patient had difficulty remaining motionless for the study.  Images are suboptimal.  Small or subtle lesions could be overlooked.  There is no acute stroke or hemorrhage.  There is no mass lesion or hydrocephalus.  There is no extra-axial fluid.  Normal cerebral volume.  Increased signal in the subcortical and periventricular white matter, consistent with hypertensive related chronic microvascular ischemic change.  No signs of hypertensive encephalopathy.  No foci of chronic hemorrhage.  No midline shift.  Unremarkable calvarium and skull base.  No visible cervical spine abnormality.  Normal pituitary.  Mild tonsillar ectopia without definite Chiari I malformation.  Post contrast, there is no definite abnormal intracranial enhancement. No acute orbital, sinus, or mastoid abnormality.  Small intraparotid cyst superficial lobe on the left.  IMPRESSION: Premature white matter changes, consistent with chronic microvascular ischemic change related hypertension.  No acute intracranial findings.  No acute stroke or bleed.  MRA HEAD  Findings: The right internal carotid artery is dolichoectatic but widely patent.  The left internal carotid artery  is dolichoectatic and displays shallow inferior cavernous outpouching, not frankly aneurysmal.  There is mild irregularity in the superior cavernous segment LICA with possible 50% stenosis.  The basilar artery is widely patent.  Both vertebrals contribute with a 50-75% stenosis of the distal right vertebral.  There is a fetal origin of the right posterior cerebral artery directly from the carotid.  Left PCA originates from the basilar tip.  Mild nonstenotic irregularity proximal right anterior cerebral artery.  Normal proximal left ACA.  Distal anterior cerebral arteries are unremarkable.  No proximal middle cerebral artery stenosis.  Mild irregularity of the distal middle cerebral artery branches without focal branch occlusion.  No PCA disease.  No cerebellar branch occlusion.  There is a 3 mm wide necked outpouching from the left posterior communicating origin at its junction with the left internal carotid artery.  This could represent a small aneurysm although an infundibulum could have this appearance on this motion degraded examination.  Consider 61-month MRA  follow-up.  IMPRESSION: Mild intracranial atherosclerotic changes as described.  No proximal flow reducing lesion.  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  Consider 6- month MRA follow-up.   Original Report Authenticated By: Davonna Belling, M.D.   Dg Knee Complete 4 Views Right  01/23/2013   *RADIOLOGY REPORT*  Clinical Data: Right knee pain and weakness.  RIGHT KNEE - COMPLETE 4+ VIEW  Comparison: None.  Findings: Degenerative changes in the knee compartments.  Prominent osteophytes involving the lateral knee compartment.  No evidence for a fracture or dislocation.  Difficult to evaluate for a suprapatellar joint effusion.  IMPRESSION: Moderate osteoarthritis of the right knee.  No acute bony abnormality.   Original Report Authenticated By: Richarda Overlie, M.D.    Microbiology: Recent Results (from the past 240 hour(s))  URINE CULTURE      Status: None   Collection Time    01/24/13  2:06 AM      Result Value Range Status   Specimen Description URINE, CLEAN CATCH   Final   Special Requests NONE   Final   Culture  Setup Time 01/24/2013 09:21   Final   Colony Count >=100,000 COLONIES/ML   Final   Culture ESCHERICHIA COLI   Final   Report Status PENDING   Incomplete     Labs: Basic Metabolic Panel:  Recent Labs Lab 01/23/13 1859 01/24/13 0540 01/25/13 0830  NA 143 143 141  K 3.6 3.1* 3.4*  CL 103 102 104  CO2  --  29 27  GLUCOSE 113* 134* 228*  BUN 46* 37* 21  CREATININE 1.60* 1.35* 1.05  CALCIUM  --  9.2 8.8   Liver Function Tests:  Recent Labs Lab 01/24/13 0540  AST 22  ALT 28  ALKPHOS 103  BILITOT 0.4  PROT 7.3  ALBUMIN 3.6   CBC:  Recent Labs Lab 01/23/13 1855 01/23/13 1859 01/24/13 0540  WBC 11.4*  --  11.1*  NEUTROABS 6.6  --  6.1  HGB 14.4 16.0* 12.6  HCT 43.5 47.0* 38.6  MCV 86.8  --  86.5  PLT 373  --  355    Signed:  Nuala Chiles  [facility] 01/25/2013, 11:28 AM

## 2013-01-26 LAB — URINE CULTURE: Colony Count: 3000

## 2013-02-15 ENCOUNTER — Encounter: Payer: Self-pay | Admitting: Internal Medicine

## 2013-02-15 ENCOUNTER — Ambulatory Visit (INDEPENDENT_AMBULATORY_CARE_PROVIDER_SITE_OTHER): Payer: Medicaid Other | Admitting: Internal Medicine

## 2013-02-15 VITALS — BP 140/68 | HR 105 | Ht 67.0 in | Wt 315.5 lb

## 2013-02-15 DIAGNOSIS — E119 Type 2 diabetes mellitus without complications: Secondary | ICD-10-CM

## 2013-02-15 DIAGNOSIS — I6350 Cerebral infarction due to unspecified occlusion or stenosis of unspecified cerebral artery: Secondary | ICD-10-CM

## 2013-02-15 DIAGNOSIS — I10 Essential (primary) hypertension: Secondary | ICD-10-CM

## 2013-02-15 DIAGNOSIS — R0989 Other specified symptoms and signs involving the circulatory and respiratory systems: Secondary | ICD-10-CM

## 2013-02-15 DIAGNOSIS — R0600 Dyspnea, unspecified: Secondary | ICD-10-CM | POA: Insufficient documentation

## 2013-02-15 DIAGNOSIS — R55 Syncope and collapse: Secondary | ICD-10-CM

## 2013-02-15 DIAGNOSIS — R079 Chest pain, unspecified: Secondary | ICD-10-CM

## 2013-02-15 DIAGNOSIS — R000 Tachycardia, unspecified: Secondary | ICD-10-CM | POA: Insufficient documentation

## 2013-02-15 DIAGNOSIS — I639 Cerebral infarction, unspecified: Secondary | ICD-10-CM

## 2013-02-15 HISTORY — DX: Chest pain, unspecified: R07.9
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
## 2013-02-15 NOTE — Patient Instructions (Addendum)
Your physician has requested that you have a lexiscan myoview. For further information please visit [email or web address] Please follow instruction sheet, as given.  Your physician recommends that you schedule a follow-up appointment in: 2-3 weeks, after your test.

## 2013-02-20 ENCOUNTER — Other Ambulatory Visit: Payer: Self-pay

## 2013-02-20 DIAGNOSIS — Z1231 Encounter for screening mammogram for malignant neoplasm of breast: Secondary | ICD-10-CM

## 2013-02-23 ENCOUNTER — Ambulatory Visit (HOSPITAL_COMMUNITY)
Admission: RE | Admit: 2013-02-23 | Discharge: 2013-02-23 | Disposition: A | Discharge status: Home / Self Care | Payer: Medicaid Other | Source: Ambulatory Visit | Attending: Internal Medicine | Admitting: Internal Medicine

## 2013-02-23 DIAGNOSIS — R0609 Other forms of dyspnea: Secondary | ICD-10-CM | POA: Insufficient documentation

## 2013-02-23 DIAGNOSIS — R000 Tachycardia, unspecified: Secondary | ICD-10-CM

## 2013-02-23 DIAGNOSIS — Z8673 Personal history of transient ischemic attack (TIA), and cerebral infarction without residual deficits: Secondary | ICD-10-CM | POA: Insufficient documentation

## 2013-02-23 DIAGNOSIS — R0989 Other specified symptoms and signs involving the circulatory and respiratory systems: Secondary | ICD-10-CM | POA: Insufficient documentation

## 2013-02-23 DIAGNOSIS — R42 Dizziness and giddiness: Secondary | ICD-10-CM | POA: Insufficient documentation

## 2013-02-23 DIAGNOSIS — R5381 Other malaise: Secondary | ICD-10-CM | POA: Insufficient documentation

## 2013-02-23 DIAGNOSIS — R5383 Other fatigue: Secondary | ICD-10-CM | POA: Insufficient documentation

## 2013-02-23 DIAGNOSIS — R55 Syncope and collapse: Secondary | ICD-10-CM

## 2013-02-23 DIAGNOSIS — I10 Essential (primary) hypertension: Secondary | ICD-10-CM | POA: Insufficient documentation

## 2013-02-23 DIAGNOSIS — R079 Chest pain, unspecified: Secondary | ICD-10-CM

## 2013-02-23 DIAGNOSIS — E669 Obesity, unspecified: Secondary | ICD-10-CM | POA: Insufficient documentation

## 2013-02-23 DIAGNOSIS — E119 Type 2 diabetes mellitus without complications: Secondary | ICD-10-CM

## 2013-02-23 DIAGNOSIS — R002 Palpitations: Secondary | ICD-10-CM | POA: Insufficient documentation

## 2013-02-23 MED ORDER — REGADENOSON 0.4 MG/5ML IV SOLN
0.4000 mg | Freq: Once | INTRAVENOUS | Status: AC
  Administered 2013-02-23: 0.4 mg via INTRAVENOUS

## 2013-02-23 MED ORDER — TECHNETIUM TC 99M SESTAMIBI GENERIC - CARDIOLITE
10.0000 | Freq: Once | INTRAVENOUS | Status: AC | PRN
  Administered 2013-02-23: 10 via INTRAVENOUS

## 2013-02-23 MED ORDER — TECHNETIUM TC 99M SESTAMIBI GENERIC - CARDIOLITE
30.0000 | Freq: Once | INTRAVENOUS | Status: AC | PRN
  Administered 2013-02-23: 30 via INTRAVENOUS

## 2013-02-23 NOTE — Procedures (Addendum)
[facility] [address] [place] [address] [place] [address] [phone]  Cardiology Nuclear Med Study  Yesenia [name] is a 52 y.o. female     MRN : [id number]     DOB: 08/29/1960  Procedure Date: 02/23/2013  Nuclear Med Background Indication for Stress Test:  Evaluation for Ischemia and Post Hospital History:  no prior cardiac history Cardiac Risk Factors: CVA, Hypertension, Lipids, NIDDM, Obesity, TIA and tachycardia  Symptoms:  Chest Pain, Dizziness, DOE, Fatigue and Palpitations   Nuclear Pre-Procedure Caffeine/Decaff Intake:  1:00am NPO After: 10:45am   IV Site: R Antecubital  IV 0.9% NS with Angio Cath:  22g  Chest Size (in):  n/a IV Started by: Koren Shiver, CNMT  Height: 5\' 7"  (1.702 m)  Cup Size: 44DD  BMI:  Body mass index is 49.32 kg/(m^2). Weight:  315 lb (142.883 kg)   Tech Comments:  n/a    Nuclear Med Study 1 or 2 day study: 1 day  Stress Test Type:  Lexiscan  Order Authorizing Provider:  Zoila Shutter, MD   Resting Radionuclide: Technetium 3m Sestamibi  Resting Radionuclide Dose: 15.8 mCi   Stress Radionuclide:  Technetium 83m Sestamibi  Stress Radionuclide Dose: 45.6 mCi           Stress Protocol Rest HR: 98 Stress HR: 116  Rest BP: 125/98 Stress BP: 128/88  Exercise Time (min): n/a METS: n/a   Predicted Max HR: 169 bpm % Max HR: 68.64 bpm Rate Pressure Product: 62952  Dose of Adenosine (mg):  n/a Dose of Lexiscan: 0.4 mg  Dose of Atropine (mg): n/a Dose of Dobutamine: n/a mcg/kg/min (at max HR)  Stress Test Technologist: Esperanza Sheets, CCT Nuclear Technologist: Koren Shiver, CNMT   Rest Procedure:  Myocardial perfusion imaging was performed at rest 45 minutes following the intravenous administration of Technetium 49m Sestamibi. Stress Procedure:  The patient received IV Lexiscan 0.4 mg over 15-seconds.  Technetium 68m Sestamibi injected at 30-seconds.  There were no significant changes with Lexiscan.   Quantitative spect images were obtained after a 45 minute delay.  Transient Ischemic Dilatation (Normal <1.22):  1.09 Lung/Heart Ratio (Normal <0.45):  0.28 QGS EDV:  80 ml QGS ESV:  30 ml LV Ejection Fraction: 62%  Rest ECG: NSR - Normal EKG  Stress ECG: No significant change from baseline ECG  QPS Raw Data Images:  There is a breast shadow that accounts for the anterior attenuation. Stress Images:  There is decreased uptake in the inferior wall. Rest Images:  There is decreased uptake in the inferior wall. Subtraction (SDS):  No evidence of ischemia.  Impression Exercise Capacity:  Lexiscan with no exercise. BP Response:  Normal blood pressure response. Clinical Symptoms:  No significant symptoms noted. ECG Impression:  No significant ECG changes with Lexiscan. Comparison with Prior Nuclear Study: No previous nuclear study performed  Overall Impression:  Low risk stress nuclear study with small fixed anteroapical breast attenuation artifact. There is an additional fixed basal inferior bowel attenuation artifact with significant bowel uptake despite a 2-day protocol..  LV Wall Motion:  NL LV Function; NL Wall Motion  Chrystie Nose, MD, [facility] Board Certified in Nuclear Cardiology Attending Cardiologist [facility]  Chrystie Nose, MD  02/23/2013 5:50 PM

## 2013-02-26 ENCOUNTER — Encounter: Payer: Self-pay | Admitting: *Deleted

## 2013-03-01 ENCOUNTER — Ambulatory Visit (INDEPENDENT_AMBULATORY_CARE_PROVIDER_SITE_OTHER): Payer: Medicaid Other | Admitting: Internal Medicine

## 2013-03-01 ENCOUNTER — Encounter: Payer: Self-pay | Admitting: Internal Medicine

## 2013-03-01 VITALS — BP 110/80 | HR 96 | Ht 67.0 in | Wt 318.9 lb

## 2013-03-01 DIAGNOSIS — R000 Tachycardia, unspecified: Secondary | ICD-10-CM

## 2013-03-01 DIAGNOSIS — R079 Chest pain, unspecified: Secondary | ICD-10-CM

## 2013-03-01 MED ORDER — METOPROLOL SUCCINATE ER 25 MG PO TB24: 25.0000 mg | ORAL_TABLET | Freq: Every day | ORAL | Status: DC

## 2013-03-01 NOTE — Progress Notes (Signed)
OFFICE NOTE  Chief Complaint:  Reason stroke, chest pain, dyspnea on exertion, syncope  Primary Care Physician: Egbert Garibaldi, NP  HPI:  Yesenia [name] is a pleasant 52 year old female who is previously from [facility] and has been living in [place] for 2 years. She has a history of stroke in the past as well as diabetes, hypertension, hyperlipidemia, multiple knee surgeries, and morbid obesity without history of sleep apnea. There is no significant family history for heart disease however breast cancer particularly distraught and the mother's side of her family. She was recently hospitalized for a stroke or TIA like events with clumsiness on her right side and she apparently passed out. This is the first time this is ever happened. It is not entirely clear from her history whether she passed out or simply fell, but she does not recall the event. Since then she's had worsening shortness of breath with exertion and episodes of chest pressure. They seem to be in the central chest and do not radiate to her neck or her arm. They are 5-6/10 intensity at the worst. Typically they resolve with rest. She also reports increasing shortness of breath with exertion and has had persistent tachycardia.  I referred her for a nuclear stress test which she underwent on 02/23/13 which was negative for ischemia. This is a 2 day protocol to try to reduce gut artifact.  PMHx:  Past Medical History  Diagnosis Date  . Hypertension   . Stroke     Past Surgical History  Procedure Laterality Date  . Leg surgery Right 2007    Surgery x3    FAMHx:  Family History  Problem Relation Age of Onset  . Leukemia Brother     SOCHx:   reports that she has never smoked. She has never used smokeless tobacco. She reports that she does not drink alcohol or use illicit drugs.  ALLERGIES:  Allergies  Allergen Reactions  . Morphine And Related     Unknown    ROS: A comprehensive review of systems was  negative except for: Constitutional: positive for fatigue Respiratory: positive for dyspnea on exertion Cardiovascular: positive for exertional chest pressure/discomfort  HOME MEDS: Current Outpatient Prescriptions  Medication Sig Dispense Refill  . albuterol (PROVENTIL HFA;VENTOLIN HFA) 108 (90 BASE) MCG/ACT inhaler Inhale 2 puffs into the lungs every 6 (six) hours as needed for wheezing.      Marland Kitchen ALPRAZolam (XANAX) 1 MG tablet Take 1 tablet (1 mg total) by mouth 3 (three) times daily as needed for sleep or anxiety.  30 tablet  0  . amLODipine (NORVASC) 10 MG tablet Take 5 mg by mouth daily.       Marland Kitchen aspirin EC 81 MG tablet Take 81 mg by mouth daily.      Marland Kitchen atorvastatin (LIPITOR) 20 MG tablet Take 1 tablet (20 mg total) by mouth daily.  30 tablet  0  . beclomethasone (QVAR) 80 MCG/ACT inhaler Inhale 1 puff into the lungs 2 (two) times daily as needed (for wheezing).       . clonazePAM (KLONOPIN) 1 MG tablet Take 1 mg by mouth at bedtime as needed for anxiety.      . furosemide (LASIX) 20 MG tablet Take 20 mg by mouth daily.      . hydrochlorothiazide (HYDRODIURIL) 25 MG tablet Take 25 mg by mouth daily.      Marland Kitchen HYDROcodone-acetaminophen (NORCO/VICODIN) 5-325 MG per tablet Take 1 tablet by mouth every 6 (six) hours as needed for pain.  30 tablet  0  . metFORMIN (GLUCOPHAGE) 500 MG tablet Take 500 mg by mouth daily with breakfast.      . predniSONE (DELTASONE) 10 MG tablet Take 10 mg by mouth daily.      . metoprolol succinate (TOPROL XL) 25 MG 24 hr tablet Take 1 tablet (25 mg total) by mouth daily.  30 tablet  11   No current facility-administered medications for this visit.    LABS/IMAGING: No results found for this or any previous visit (from the past 48 hour(s)). No results found.  VITALS: BP 110/80  Pulse 96  Ht 5\' 7"  (1.702 m)  Wt 318 lb 14.4 oz (144.652 kg)  BMI 49.94 kg/m2  EXAM: deferred  EKG: deferred  ASSESSMENT: 1. Chest pain, worsening dyspnea on  exertion 2. History of stroke 3. Morbid obesity 4. Diabetes 5. Hypertension 6. Dyslipidemia 7. Unexplained syncope  PLAN: 1.   Yesenia [name] had a negative nuclear stress test for ischemia. I think a lot of her shortness of breath is due to deconditioning and her obesity as well as tachycardia. I recommend starting low-dose beta blocker. Given her low normal blood pressure, I would decrease her amlodipine to 5 mg daily. Will add Toprol-XL 25 mg daily. Plan to see her back in one month followup and we'll see if her symptoms have improved.  Thank you again for the kind referral.  Chrystie Nose, MD, [facility] Attending Cardiologist [facility]  Yesenia [name] 03/01/2013, 12:26 PM

## 2013-03-01 NOTE — Patient Instructions (Addendum)
Your physician has recommended you make the following change in your medication:  -  TAKE 5mg  of AMLODIPINE (cut your 10mg  tablets in half). -  START taking TOPROL XL 25mg  daily.   Your physician recommends that you schedule a follow-up appointment in: 1 month.

## 2013-03-05 ENCOUNTER — Ambulatory Visit: Payer: Medicaid Other | Admitting: Internal Medicine

## 2013-03-08 ENCOUNTER — Ambulatory Visit: Payer: Medicaid Other

## 2013-03-12 ENCOUNTER — Encounter (HOSPITAL_COMMUNITY): Payer: Self-pay | Admitting: Nurse Practitioner

## 2013-03-12 ENCOUNTER — Emergency Department (HOSPITAL_COMMUNITY)
Admission: EM | Admit: 2013-03-12 | Discharge: 2013-03-12 | Disposition: A | Discharge status: Home / Self Care | Payer: Medicaid Other | Attending: Emergency Medicine | Admitting: Emergency Medicine

## 2013-03-12 DIAGNOSIS — Z8673 Personal history of transient ischemic attack (TIA), and cerebral infarction without residual deficits: Secondary | ICD-10-CM | POA: Insufficient documentation

## 2013-03-12 DIAGNOSIS — Z7902 Long term (current) use of antithrombotics/antiplatelets: Secondary | ICD-10-CM | POA: Insufficient documentation

## 2013-03-12 DIAGNOSIS — Z862 Personal history of diseases of the blood and blood-forming organs and certain disorders involving the immune mechanism: Secondary | ICD-10-CM | POA: Insufficient documentation

## 2013-03-12 DIAGNOSIS — E119 Type 2 diabetes mellitus without complications: Secondary | ICD-10-CM | POA: Insufficient documentation

## 2013-03-12 DIAGNOSIS — I10 Essential (primary) hypertension: Secondary | ICD-10-CM | POA: Insufficient documentation

## 2013-03-12 DIAGNOSIS — Z79899 Other long term (current) drug therapy: Secondary | ICD-10-CM | POA: Insufficient documentation

## 2013-03-12 DIAGNOSIS — Z7982 Long term (current) use of aspirin: Secondary | ICD-10-CM | POA: Insufficient documentation

## 2013-03-12 DIAGNOSIS — G43909 Migraine, unspecified, not intractable, without status migrainosus: Secondary | ICD-10-CM

## 2013-03-12 DIAGNOSIS — T448X5A Adverse effect of centrally-acting and adrenergic-neuron-blocking agents, initial encounter: Secondary | ICD-10-CM | POA: Insufficient documentation

## 2013-03-12 HISTORY — DX: Anemia, unspecified: D64.9

## 2013-03-12 LAB — COMPREHENSIVE METABOLIC PANEL
ALT: 23 U/L (ref 0–35)
Albumin: 3.4 g/dL — ABNORMAL LOW (ref 3.5–5.2)
Alkaline Phosphatase: 84 U/L (ref 39–117)
Chloride: 104 mEq/L (ref 96–112)
Glucose, Bld: 125 mg/dL — ABNORMAL HIGH (ref 70–99)
Potassium: 3.8 mEq/L (ref 3.5–5.1)
Sodium: 141 mEq/L (ref 135–145)
Total Bilirubin: 0.2 mg/dL — ABNORMAL LOW (ref 0.3–1.2)
Total Protein: 7.4 g/dL (ref 6.0–8.3)

## 2013-03-12 LAB — URINE MICROSCOPIC-ADD ON

## 2013-03-12 LAB — URINALYSIS, ROUTINE W REFLEX MICROSCOPIC
Bilirubin Urine: NEGATIVE
Glucose, UA: NEGATIVE mg/dL
Ketones, ur: NEGATIVE mg/dL
pH: 5 (ref 5.0–8.0)

## 2013-03-12 LAB — CBC
Hemoglobin: 12.3 g/dL (ref 12.0–15.0)
MCHC: 34.4 g/dL (ref 30.0–36.0)
RDW: 14 % (ref 11.5–15.5)
WBC: 9.3 10*3/uL (ref 4.0–10.5)

## 2013-03-12 MED ORDER — KETOROLAC TROMETHAMINE 30 MG/ML IJ SOLN
30.0000 mg | Freq: Once | INTRAMUSCULAR | Status: AC
  Administered 2013-03-12: 30 mg via INTRAVENOUS
  Filled 2013-03-12: qty 1

## 2013-03-12 MED ORDER — SODIUM CHLORIDE 0.9 % IV BOLUS (SEPSIS)
500.0000 mL | Freq: Once | INTRAVENOUS | Status: AC
  Administered 2013-03-12: 500 mL via INTRAVENOUS

## 2013-03-12 MED ORDER — METOCLOPRAMIDE HCL 5 MG/ML IJ SOLN
10.0000 mg | Freq: Once | INTRAMUSCULAR | Status: AC
  Administered 2013-03-12: 10 mg via INTRAVENOUS
  Filled 2013-03-12: qty 2

## 2013-03-12 MED ORDER — DIPHENHYDRAMINE HCL 50 MG/ML IJ SOLN
25.0000 mg | Freq: Once | INTRAMUSCULAR | Status: AC
  Administered 2013-03-12: 25 mg via INTRAVENOUS
  Filled 2013-03-12: qty 1

## 2013-03-12 MED ORDER — DEXAMETHASONE SODIUM PHOSPHATE 10 MG/ML IJ SOLN
10.0000 mg | Freq: Once | INTRAMUSCULAR | Status: AC
  Administered 2013-03-12: 10 mg via INTRAVENOUS
  Filled 2013-03-12: qty 1

## 2013-03-12 NOTE — ED Notes (Addendum)
Pt has no transportation home, pt has a cane and would have too much difficulty ambulating to bus, house coverage paged for cab voucher

## 2013-03-12 NOTE — ED Notes (Signed)
Per ems: pt reports PCP changed her BP medication yesterday and she started taking Tenormin. Reports she has felt progressively "worse" since she started taking the tenormin. BP 144 palp, PR 113, O2 sats 96% RA, RR 22, CBG 144.

## 2013-03-12 NOTE — ED Provider Notes (Signed)
Medical screening examination/treatment/procedure(s) were performed by non-physician practitioner and as supervising physician I was immediately available for consultation/collaboration.  Ethyl Vila L Daniah Zaldivar, MD 03/12/13 1936 

## 2013-03-12 NOTE — ED Provider Notes (Signed)
CSN: [id number]     Arrival date & time 03/12/13  1114 History   First MD Initiated Contact with Patient 03/12/13 1117     Chief Complaint  Patient presents with  . Medication Reaction   (Consider location/radiation/quality/duration/timing/severity/associated sxs/prior Treatment) HPI Comments: 52 year old female with a past medical history of hypertension, diabetes, anemia and stroke presents to the emergency department complaining of generalized headache over the past month after being put on metformin for borderline diabetes, worsening today after her vascular doctor Dr. Rennis Golden prescribed her metoprolol yesterday. Patient states this morning she woke up with a worse headache, nausea and one episode of vomiting. Headache is frontal, throbbing, worse after eating. She has not had any alleviating factors. Admits to associated photophobia. Admits to a history of migraines and states this feels similar. Denies lightheadedness, dizziness, chest pain, shortness of breath, fever, chills or abdominal pain. She does not monitor her sugar level at home. She was put on metformin after being admitted into the hospital one month back by the admitting doctor, has not followed up with her primary care physician since.  The history is provided by the patient.    Past Medical History  Diagnosis Date  . Hypertension   . Stroke   . Diabetes mellitus without complication   . Anemia    Past Surgical History  Procedure Laterality Date  . Leg surgery Right 2007    Surgery x3   Family History  Problem Relation Age of Onset  . Leukemia Brother    History  Substance Use Topics  . Smoking status: Never Smoker   . Smokeless tobacco: Never Used  . Alcohol Use: No   OB History   Grav Para Term Preterm Abortions TAB SAB Ect Mult Living                 Review of Systems  Constitutional: Negative for fever and chills.  HENT: Negative for neck pain and neck stiffness.   Eyes: Positive for photophobia.  Negative for visual disturbance.  Respiratory: Negative for shortness of breath.   Cardiovascular: Negative for chest pain.  Gastrointestinal: Positive for nausea and vomiting. Negative for abdominal pain.  Musculoskeletal: Negative for back pain.  Neurological: Positive for headaches. Negative for dizziness, syncope, speech difficulty and light-headedness.  Psychiatric/Behavioral: Negative for confusion.  All other systems reviewed and are negative.    Allergies  Morphine and related  Home Medications   Current Outpatient Rx  Name  Route  Sig  Dispense  Refill  . albuterol (PROVENTIL HFA;VENTOLIN HFA) 108 (90 BASE) MCG/ACT inhaler   Inhalation   Inhale 2 puffs into the lungs every 6 (six) hours as needed for wheezing.         Marland Kitchen ALPRAZolam (XANAX) 1 MG tablet   Oral   Take 1 tablet (1 mg total) by mouth 3 (three) times daily as needed for sleep or anxiety.   30 tablet   0   . amLODipine (NORVASC) 10 MG tablet   Oral   Take 10 mg by mouth daily.          Marland Kitchen aspirin EC 81 MG tablet   Oral   Take 81 mg by mouth daily.         Marland Kitchen atorvastatin (LIPITOR) 20 MG tablet   Oral   Take 1 tablet (20 mg total) by mouth daily.   30 tablet   0   . beclomethasone (QVAR) 80 MCG/ACT inhaler   Inhalation   Inhale 1 puff into  the lungs 2 (two) times daily as needed (for wheezing).          . furosemide (LASIX) 20 MG tablet   Oral   Take 20 mg by mouth daily.         . hydrochlorothiazide (HYDRODIURIL) 25 MG tablet   Oral   Take 25 mg by mouth daily.         . metFORMIN (GLUCOPHAGE) 500 MG tablet   Oral   Take 500 mg by mouth daily with breakfast.         . metoprolol succinate (TOPROL XL) 25 MG 24 hr tablet   Oral   Take 1 tablet (25 mg total) by mouth daily.   30 tablet   11   . predniSONE (DELTASONE) 10 MG tablet   Oral   Take 10 mg by mouth daily.          BP 128/69  Pulse 97  Temp(Src) 98.3 F (36.8 C) (Oral)  Resp 18  SpO2 99% Physical Exam   Nursing note and vitals reviewed. Constitutional: She is oriented to person, place, and time. She appears well-developed and well-nourished. No distress.  Obese  HENT:  Head: Normocephalic and atraumatic.  Mouth/Throat: Oropharynx is clear and moist.  Eyes: Conjunctivae and EOM are normal. Pupils are equal, round, and reactive to light.  Neck: Normal range of motion. Neck supple.  Cardiovascular: Normal rate, regular rhythm and normal heart sounds.   Pulmonary/Chest: Effort normal and breath sounds normal.  Abdominal: Soft. Normal appearance and bowel sounds are normal. She exhibits no distension and no mass. There is generalized tenderness (mild). There is no rigidity, no rebound and no guarding.  No peritoneal signs.  Musculoskeletal: Normal range of motion. She exhibits no edema.  Neurological: She is alert and oriented to person, place, and time. She has normal strength. No cranial nerve deficit or sensory deficit. Coordination normal.  Skin: Skin is warm and dry. She is not diaphoretic.  Psychiatric: She has a normal mood and affect. Her behavior is normal.    ED Course  Procedures (including critical care time) Labs Review Labs Reviewed  CBC - Abnormal; Notable for the following:    HCT 35.8 (*)    All other components within normal limits  COMPREHENSIVE METABOLIC PANEL - Abnormal; Notable for the following:    Glucose, Bld 125 (*)    Albumin 3.4 (*)    Total Bilirubin 0.2 (*)    GFR calc non Af Amer 72 (*)    GFR calc Af Amer 83 (*)    All other components within normal limits  URINALYSIS, ROUTINE W REFLEX MICROSCOPIC - Abnormal; Notable for the following:    Hgb urine dipstick TRACE (*)    All other components within normal limits  URINE MICROSCOPIC-ADD ON - Abnormal; Notable for the following:    Squamous Epithelial / LPF FEW (*)    Bacteria, UA FEW (*)    All other components within normal limits   Imaging Review No results found.  MDM   1. Migraine       Patient with headache consistent with migraines she has had in the past. One episode of vomiting. Patient is concerned because his symptoms were present after beginning metoprolol and metformin. She appears in no apparent distress. Normal vital signs. No red flags concerning patient's headache. Afebrile, no neck pain or stiffness, unremarkable neurologic exam. Will treat for migraine with Reglan and Benadryl and reassess. Labs pending, CBC, CMP, UA.  2:30 PM  No improvement with reglan and benadryl, gave decadron, toradol with some relief. Patient states HA is easing off. UA pending. CBC, CMP unremarkable.  3:29 PM UA without infection. Patient still reports improvement of her symptoms. I believe her symptoms are related to migraine headaches. I excised her to make no medication changes at this time and to follow up with her primary care physician as soon as possible. Close return precautions discussed. Patient states understanding of plan and is agreeable.  Trevor Mace, PA-C 03/12/13 1529

## 2013-03-13 ENCOUNTER — Emergency Department (HOSPITAL_COMMUNITY): Payer: Medicaid Other

## 2013-03-13 ENCOUNTER — Emergency Department (HOSPITAL_COMMUNITY)
Admission: EM | Admit: 2013-03-13 | Discharge: 2013-03-14 | Disposition: A | Discharge status: Home / Self Care | Payer: Medicaid Other | Attending: Emergency Medicine | Admitting: Emergency Medicine

## 2013-03-13 ENCOUNTER — Encounter (HOSPITAL_COMMUNITY): Payer: Self-pay

## 2013-03-13 DIAGNOSIS — S0990XA Unspecified injury of head, initial encounter: Secondary | ICD-10-CM | POA: Insufficient documentation

## 2013-03-13 DIAGNOSIS — IMO0002 Reserved for concepts with insufficient information to code with codable children: Secondary | ICD-10-CM | POA: Insufficient documentation

## 2013-03-13 DIAGNOSIS — Y9389 Activity, other specified: Secondary | ICD-10-CM | POA: Insufficient documentation

## 2013-03-13 DIAGNOSIS — W19XXXA Unspecified fall, initial encounter: Secondary | ICD-10-CM

## 2013-03-13 DIAGNOSIS — W010XXA Fall on same level from slipping, tripping and stumbling without subsequent striking against object, initial encounter: Secondary | ICD-10-CM | POA: Insufficient documentation

## 2013-03-13 DIAGNOSIS — Z79899 Other long term (current) drug therapy: Secondary | ICD-10-CM | POA: Insufficient documentation

## 2013-03-13 DIAGNOSIS — R42 Dizziness and giddiness: Secondary | ICD-10-CM | POA: Insufficient documentation

## 2013-03-13 DIAGNOSIS — E119 Type 2 diabetes mellitus without complications: Secondary | ICD-10-CM | POA: Insufficient documentation

## 2013-03-13 DIAGNOSIS — Z862 Personal history of diseases of the blood and blood-forming organs and certain disorders involving the immune mechanism: Secondary | ICD-10-CM | POA: Insufficient documentation

## 2013-03-13 DIAGNOSIS — R519 Headache, unspecified: Secondary | ICD-10-CM

## 2013-03-13 DIAGNOSIS — Y92009 Unspecified place in unspecified non-institutional (private) residence as the place of occurrence of the external cause: Secondary | ICD-10-CM | POA: Insufficient documentation

## 2013-03-13 DIAGNOSIS — I10 Essential (primary) hypertension: Secondary | ICD-10-CM | POA: Insufficient documentation

## 2013-03-13 DIAGNOSIS — E669 Obesity, unspecified: Secondary | ICD-10-CM | POA: Insufficient documentation

## 2013-03-13 DIAGNOSIS — R110 Nausea: Secondary | ICD-10-CM | POA: Insufficient documentation

## 2013-03-13 DIAGNOSIS — Z8673 Personal history of transient ischemic attack (TIA), and cerebral infarction without residual deficits: Secondary | ICD-10-CM | POA: Insufficient documentation

## 2013-03-13 DIAGNOSIS — Z7982 Long term (current) use of aspirin: Secondary | ICD-10-CM | POA: Insufficient documentation

## 2013-03-13 DIAGNOSIS — H53149 Visual discomfort, unspecified: Secondary | ICD-10-CM | POA: Insufficient documentation

## 2013-03-13 MED ORDER — SODIUM CHLORIDE 0.9 % IV BOLUS (SEPSIS)
1000.0000 mL | Freq: Once | INTRAVENOUS | Status: AC
  Administered 2013-03-13: 1000 mL via INTRAVENOUS

## 2013-03-13 NOTE — ED Provider Notes (Addendum)
CSN: [id number]     Arrival date & time 03/13/13  2044 History   First MD Initiated Contact with Patient 03/13/13 2155     Chief Complaint  Patient presents with  . Headache  . Back Pain   (Consider location/radiation/quality/duration/timing/severity/associated sxs/prior Treatment) Patient is a 52 y.o. female presenting with headaches and back pain. The history is provided by the patient and medical records. No language interpreter was used.  Headache Associated symptoms: back pain, dizziness, nausea and photophobia   Associated symptoms: no abdominal pain, no cough, no diarrhea, no fatigue, no fever, no neck stiffness and no vomiting   Back Pain Associated symptoms: headaches   Associated symptoms: no abdominal pain, no chest pain, no dysuria and no fever     Yesenia [name] is a 52 y.o. female  with a hx of hypertension, diabetes, anemia, vertigo, migraine HA and stroke (2 mos ago) presents to the Emergency Department complaining of gradual, persistent, progressively worsening headache onset yesterday.  Pt states she was seen here yesterday for a migraine headache.  She was treated with a migraine cocktail which made her feel better. When she arrived home she tripped on some boxes and hit the right side of her head on the wall and then fell to the ground.  Pt denies LOC, but states she now has associated dizziness (like the room is spinning).  Pt reports that her headache feels the same as it did yesterday.  Pt denies vomiting today.  Associated symptoms include nausea, photophobia, dizziness.  Headache is described as throbbing, located in the frontal region.  Nothing makes it better and sitting and walking makes it worse.  Pt has not tried and OTC medications.  Pt denies fever, chills, chest pain, SOB, abd pain, vomiting, weakness, syncope, dysuria, hematuria.      Past Medical History  Diagnosis Date  . Hypertension   . Stroke   . Diabetes mellitus without complication   . Anemia    Past  Surgical History  Procedure Laterality Date  . Leg surgery Right 2007    Surgery x3   Family History  Problem Relation Age of Onset  . Leukemia Brother    History  Substance Use Topics  . Smoking status: Never Smoker   . Smokeless tobacco: Never Used  . Alcohol Use: No   OB History   Grav Para Term Preterm Abortions TAB SAB Ect Mult Living                 Review of Systems  Constitutional: Negative for fever, diaphoresis, appetite change, fatigue and unexpected weight change.  HENT: Negative for mouth sores and neck stiffness.   Eyes: Positive for photophobia. Negative for visual disturbance.  Respiratory: Negative for cough, chest tightness, shortness of breath and wheezing.   Cardiovascular: Negative for chest pain.  Gastrointestinal: Positive for nausea. Negative for vomiting, abdominal pain, diarrhea and constipation.  Endocrine: Negative for polydipsia, polyphagia and polyuria.  Genitourinary: Negative for dysuria, urgency, frequency and hematuria.  Musculoskeletal: Positive for back pain.  Skin: Negative for rash.  Allergic/Immunologic: Negative for immunocompromised state.  Neurological: Positive for dizziness and headaches. Negative for syncope and light-headedness.  Hematological: Does not bruise/bleed easily.  Psychiatric/Behavioral: Negative for sleep disturbance. The patient is not nervous/anxious.     Allergies  Morphine and related  Home Medications   Current Outpatient Rx  Name  Route  Sig  Dispense  Refill  . albuterol (PROVENTIL HFA;VENTOLIN HFA) 108 (90 BASE) MCG/ACT inhaler   Inhalation  Inhale 2 puffs into the lungs every 6 (six) hours as needed for wheezing.         Marland Kitchen ALPRAZolam (XANAX) 1 MG tablet   Oral   Take 1 tablet (1 mg total) by mouth 3 (three) times daily as needed for sleep or anxiety.   30 tablet   0   . amLODipine (NORVASC) 10 MG tablet   Oral   Take 10 mg by mouth daily.          Marland Kitchen aspirin EC 81 MG tablet   Oral   Take  81 mg by mouth daily.         Marland Kitchen atorvastatin (LIPITOR) 20 MG tablet   Oral   Take 1 tablet (20 mg total) by mouth daily.   30 tablet   0   . beclomethasone (QVAR) 80 MCG/ACT inhaler   Inhalation   Inhale 1 puff into the lungs 2 (two) times daily as needed (for wheezing).          . furosemide (LASIX) 20 MG tablet   Oral   Take 20 mg by mouth daily.         . hydrochlorothiazide (HYDRODIURIL) 25 MG tablet   Oral   Take 25 mg by mouth daily.         . metFORMIN (GLUCOPHAGE) 500 MG tablet   Oral   Take 500 mg by mouth daily with breakfast.         . metoprolol succinate (TOPROL XL) 25 MG 24 hr tablet   Oral   Take 1 tablet (25 mg total) by mouth daily.   30 tablet   11   . predniSONE (DELTASONE) 10 MG tablet   Oral   Take 10 mg by mouth daily.          BP 124/71  Pulse 89  Temp(Src) 98.1 F (36.7 C) (Oral)  Resp 20  SpO2 97% Physical Exam  Nursing note and vitals reviewed. Constitutional: She is oriented to person, place, and time. She appears well-developed and well-nourished. No distress.  Awake, alert, nontoxic appearance Obese female  HENT:  Head: Normocephalic and atraumatic.  Right Ear: Tympanic membrane, external ear and ear canal normal.  Left Ear: Tympanic membrane, external ear and ear canal normal.  Nose: Nose normal.  Mouth/Throat: Uvula is midline, oropharynx is clear and moist and mucous membranes are normal. Mucous membranes are not dry. No edematous. No oropharyngeal exudate, posterior oropharyngeal edema, posterior oropharyngeal erythema or tonsillar abscesses.  Several well healed scars on pt's chin  Eyes: Conjunctivae and EOM are normal. Pupils are equal, round, and reactive to light. No scleral icterus.  Neck: Normal range of motion. Neck supple.  Cardiovascular: Normal rate, regular rhythm, normal heart sounds and intact distal pulses.   No murmur heard. Pulmonary/Chest: Effort normal and breath sounds normal. No respiratory  distress. She has no wheezes. She has no rales.  Abdominal: Soft. Bowel sounds are normal. She exhibits no distension and no mass. There is no tenderness. There is no rebound and no guarding.  Musculoskeletal: Normal range of motion. She exhibits no edema and no tenderness.  Lymphadenopathy:    She has no cervical adenopathy.  Neurological: She is alert and oriented to person, place, and time. She has normal reflexes. No cranial nerve deficit. She exhibits normal muscle tone. Coordination normal.  Speech is clear and goal oriented, follows commands Cranial nerves III - XII without deficit, no facial droop Normal strength in upper and lower extremities bilaterally,  strong and equal grip strength Sensation normal to light and sharp touch Moves extremities without ataxia, coordination intact Normal finger to nose and rapid alternating movements Pt refuses to walk on initial exam  Skin: Skin is warm and dry. No rash noted. She is not diaphoretic. There is erythema (mild).  Mild erythema noted to the right arm; no induration, increased warmth or swelling to suggest DVT, cellulitis or abscess formation  Psychiatric: She has a normal mood and affect. Her behavior is normal. Judgment and thought content normal.    ED Course  Procedures (including critical care time) Labs Review Labs Reviewed  CBC - Abnormal; Notable for the following:    WBC 14.9 (*)    All other components within normal limits  BASIC METABOLIC PANEL - Abnormal; Notable for the following:    Glucose, Bld 137 (*)    GFR calc non Af Amer 69 (*)    GFR calc Af Amer 80 (*)    All other components within normal limits   Imaging Review Ct Head Wo Contrast  03/13/2013   *RADIOLOGY REPORT*  Clinical Data:  Headache, back pain, fall  CT HEAD WITHOUT CONTRAST CT CERVICAL SPINE WITHOUT CONTRAST  Technique:  Multidetector CT imaging of the head and cervical spine was performed following the standard protocol without intravenous contrast.   Multiplanar CT image reconstructions of the cervical spine were also generated.  Comparison:   None  CT HEAD  Findings: There is no acute intracranial hemorrhage or infarct.  No mass or midline shift.  Gray-white matter differentiation is normal.  No extra-axial fluid collection.  Calvarium is intact.  Orbital soft tissues are normal.  Paranasal sinuses and mastoid air cells are clear.  IMPRESSION: No acute intracranial process.  CT CERVICAL SPINE  Findings: There is reversal of the normal cervical lordosis, likely related to patient positioning.  Vertebral body heights are preserved.  No acute fracture or listhesis identified.  Normal C1-2 articulations are intact.  Degenerative spurring is present at the C4-5, C5-6, and C6-7 levels.  Mild prominence of the prevertebral soft tissues is also likely related to patient positioning (neck flexion). No paravertebral soft tissue abnormality identified.  Visualized lungs are clear.  IMPRESSION: 1. No CT evidence of acute fracture or listhesis. 2.  Degenerative disc disease at C4-5, C5-6, and C6-7.   Original Report Authenticated By: Rise Mu, M.D.   Ct Cervical Spine Wo Contrast  03/13/2013   *RADIOLOGY REPORT*  Clinical Data:  Headache, back pain, fall  CT HEAD WITHOUT CONTRAST CT CERVICAL SPINE WITHOUT CONTRAST  Technique:  Multidetector CT imaging of the head and cervical spine was performed following the standard protocol without intravenous contrast.  Multiplanar CT image reconstructions of the cervical spine were also generated.  Comparison:   None  CT HEAD  Findings: There is no acute intracranial hemorrhage or infarct.  No mass or midline shift.  Gray-white matter differentiation is normal.  No extra-axial fluid collection.  Calvarium is intact.  Orbital soft tissues are normal.  Paranasal sinuses and mastoid air cells are clear.  IMPRESSION: No acute intracranial process.  CT CERVICAL SPINE  Findings: There is reversal of the normal cervical  lordosis, likely related to patient positioning.  Vertebral body heights are preserved.  No acute fracture or listhesis identified.  Normal C1-2 articulations are intact.  Degenerative spurring is present at the C4-5, C5-6, and C6-7 levels.  Mild prominence of the prevertebral soft tissues is also likely related to patient positioning (neck flexion). No  paravertebral soft tissue abnormality identified.  Visualized lungs are clear.  IMPRESSION: 1. No CT evidence of acute fracture or listhesis. 2.  Degenerative disc disease at C4-5, C5-6, and C6-7.   Original Report Authenticated By: Rise Mu, M.D.    MDM   1. Fall at home, initial encounter   2. Headache      Yesenia [name] presents with headache after fall.  Pt largely uncooperative with neurologic exam, giving poor effort.  Head CT negative for ICH, CVA or mass effect.  I personally reviewed the imaging tests through PACS system.  I reviewed available ER/hospitalization records through the EMR.  Headache likely of migrainous origin.  Will treat headache and re-evaluate.    Record review with negative CVA work-up early in July and suggestion of patients symptoms to be psychosomatic.    1:18 AM Pt HA treated and improved while in ED.  Presentation is like pts typical HA and non concerning for [facility], ICH, Meningitis, or temporal arteritis. Pt is afebrile with no focal neuro deficits, nuchal rigidity, or change in vision. Pt ambulates without difficulty.  No neuro deficits on repeat neurologic exam with improved effort.  Pt is to follow up with PCP to discuss prophylactic medication. Pt verbalizes understanding and is agreeable with plan to dc.   Dr. Denton Lank was consulted, evaluated this patient with me and agrees with the plan.     Yesenia Client Dyanne Yorks, PA-C 03/14/13 0216  Yesenia Client Yumiko Alkins, PA-C 03/14/13 1409

## 2013-03-13 NOTE — ED Notes (Signed)
Bed: ZO10 Expected date:  Expected time:  Means of arrival:  Comments: EMS 52yo F, headache, back pain, fall

## 2013-03-13 NOTE — ED Notes (Signed)
Per EMS: Pt was home yesterday evening tripped going into home and hit her head. Pt c/o of headache since yesterday that comes and goes. No deformity noted or swelling to R side of temporal area. Pt also c/o of lower back. Negative SCCA and stroke screen. Pt recently began taking a new beta blocker.

## 2013-03-14 LAB — BASIC METABOLIC PANEL
CO2: 26 mEq/L (ref 19–32)
Chloride: 107 mEq/L (ref 96–112)
GFR calc non Af Amer: 69 mL/min — ABNORMAL LOW (ref >90)
Glucose, Bld: 137 mg/dL — ABNORMAL HIGH (ref 70–99)
Potassium: 3.7 mEq/L (ref 3.5–5.1)
Sodium: 142 mEq/L (ref 135–145)

## 2013-03-14 LAB — CBC
Hemoglobin: 12.1 g/dL (ref 12.0–15.0)
RBC: 4.24 MIL/uL (ref 3.87–5.11)
WBC: 14.9 10*3/uL — ABNORMAL HIGH (ref 4.0–10.5)

## 2013-03-14 MED ORDER — DIPHENHYDRAMINE HCL 50 MG/ML IJ SOLN
25.0000 mg | Freq: Once | INTRAMUSCULAR | Status: AC
  Administered 2013-03-14: 25 mg via INTRAVENOUS
  Filled 2013-03-14: qty 1

## 2013-03-14 MED ORDER — KETOROLAC TROMETHAMINE 30 MG/ML IJ SOLN
30.0000 mg | Freq: Once | INTRAMUSCULAR | Status: AC
  Administered 2013-03-14: 30 mg via INTRAVENOUS
  Filled 2013-03-14: qty 1

## 2013-03-14 MED ORDER — METOCLOPRAMIDE HCL 5 MG/ML IJ SOLN
10.0000 mg | Freq: Once | INTRAMUSCULAR | Status: AC
  Administered 2013-03-14: 10 mg via INTRAVENOUS
  Filled 2013-03-14: qty 2

## 2013-03-14 NOTE — ED Provider Notes (Signed)
Medical screening examination/treatment/procedure(s) were conducted as a shared visit with non-physician practitioner(s) and myself.  I personally evaluated the patient during the encounter Pt c/o feeling generally weak, notes hx same for past 2-3 months. No new, unilateral numbness or weakness. No cp or sob. No fever or chills. Labs.    Suzi Roots, MD 03/14/13 [phone]

## 2013-03-19 NOTE — ED Provider Notes (Signed)
Medical screening examination/treatment/procedure(s) were performed by non-physician practitioner and as supervising physician I was immediately available for consultation/collaboration.   Quentyn Kolbeck E Kyson Kupper, MD 03/19/13 0930 

## 2013-03-22 ENCOUNTER — Ambulatory Visit: Payer: Medicaid Other

## 2013-04-02 ENCOUNTER — Ambulatory Visit: Payer: Medicaid Other | Admitting: Internal Medicine

## 2013-04-09 ENCOUNTER — Ambulatory Visit: Payer: Medicaid Other | Admitting: Internal Medicine

## 2013-04-11 ENCOUNTER — Inpatient Hospital Stay (HOSPITAL_COMMUNITY)
Admission: EM | Admit: 2013-04-11 | Discharge: 2013-04-16 | DRG: 689 | Disposition: A | Discharge status: Skilled Nursing Facility | Payer: Medicaid Other | Attending: Family Medicine | Admitting: Family Medicine

## 2013-04-11 ENCOUNTER — Emergency Department (HOSPITAL_COMMUNITY): Payer: Medicaid Other

## 2013-04-11 ENCOUNTER — Encounter (HOSPITAL_COMMUNITY): Payer: Self-pay | Admitting: Emergency Medicine

## 2013-04-11 DIAGNOSIS — Z79899 Other long term (current) drug therapy: Secondary | ICD-10-CM

## 2013-04-11 DIAGNOSIS — G43909 Migraine, unspecified, not intractable, without status migrainosus: Secondary | ICD-10-CM | POA: Diagnosis present

## 2013-04-11 DIAGNOSIS — N10 Acute tubulo-interstitial nephritis: Principal | ICD-10-CM | POA: Diagnosis present

## 2013-04-11 DIAGNOSIS — B372 Candidiasis of skin and nail: Secondary | ICD-10-CM | POA: Diagnosis present

## 2013-04-11 DIAGNOSIS — Z6841 Body Mass Index (BMI) 40.0 and over, adult: Secondary | ICD-10-CM

## 2013-04-11 DIAGNOSIS — I10 Essential (primary) hypertension: Secondary | ICD-10-CM | POA: Diagnosis present

## 2013-04-11 DIAGNOSIS — E1149 Type 2 diabetes mellitus with other diabetic neurological complication: Secondary | ICD-10-CM | POA: Diagnosis present

## 2013-04-11 DIAGNOSIS — I152 Hypertension secondary to endocrine disorders: Secondary | ICD-10-CM | POA: Diagnosis present

## 2013-04-11 DIAGNOSIS — D649 Anemia, unspecified: Secondary | ICD-10-CM | POA: Diagnosis present

## 2013-04-11 DIAGNOSIS — I69959 Hemiplegia and hemiparesis following unspecified cerebrovascular disease affecting unspecified side: Secondary | ICD-10-CM

## 2013-04-11 DIAGNOSIS — R109 Unspecified abdominal pain: Secondary | ICD-10-CM | POA: Diagnosis present

## 2013-04-11 DIAGNOSIS — I6350 Cerebral infarction due to unspecified occlusion or stenosis of unspecified cerebral artery: Secondary | ICD-10-CM

## 2013-04-11 DIAGNOSIS — E785 Hyperlipidemia, unspecified: Secondary | ICD-10-CM | POA: Diagnosis present

## 2013-04-11 DIAGNOSIS — N12 Tubulo-interstitial nephritis, not specified as acute or chronic: Secondary | ICD-10-CM

## 2013-04-11 DIAGNOSIS — R51 Headache: Secondary | ICD-10-CM

## 2013-04-11 DIAGNOSIS — R519 Headache, unspecified: Secondary | ICD-10-CM

## 2013-04-11 DIAGNOSIS — G9340 Encephalopathy, unspecified: Secondary | ICD-10-CM | POA: Diagnosis present

## 2013-04-11 DIAGNOSIS — E1142 Type 2 diabetes mellitus with diabetic polyneuropathy: Secondary | ICD-10-CM | POA: Diagnosis present

## 2013-04-11 DIAGNOSIS — R413 Other amnesia: Secondary | ICD-10-CM | POA: Diagnosis present

## 2013-04-11 DIAGNOSIS — R42 Dizziness and giddiness: Secondary | ICD-10-CM | POA: Diagnosis present

## 2013-04-11 DIAGNOSIS — E1159 Type 2 diabetes mellitus with other circulatory complications: Secondary | ICD-10-CM | POA: Diagnosis present

## 2013-04-11 DIAGNOSIS — I639 Cerebral infarction, unspecified: Secondary | ICD-10-CM | POA: Diagnosis present

## 2013-04-11 DIAGNOSIS — I671 Cerebral aneurysm, nonruptured: Secondary | ICD-10-CM | POA: Diagnosis present

## 2013-04-11 DIAGNOSIS — R4182 Altered mental status, unspecified: Secondary | ICD-10-CM

## 2013-04-11 LAB — URINE MICROSCOPIC-ADD ON

## 2013-04-11 LAB — COMPREHENSIVE METABOLIC PANEL
ALT: 20 U/L (ref 0–35)
ALT: 18 U/L (ref 0–35)
AST: 16 U/L (ref 0–37)
Albumin: 3.5 g/dL (ref 3.5–5.2)
Alkaline Phosphatase: 79 U/L (ref 39–117)
Alkaline Phosphatase: 77 U/L (ref 39–117)
BUN: 12 mg/dL (ref 6–23)
CO2: 26 mEq/L (ref 19–32)
CO2: 28 mEq/L (ref 19–32)
Calcium: 9.4 mg/dL (ref 8.4–10.5)
Calcium: 8.8 mg/dL (ref 8.4–10.5)
Chloride: 106 mEq/L (ref 96–112)
Creatinine, Ser: 0.92 mg/dL (ref 0.50–1.10)
GFR calc Af Amer: 83 mL/min — ABNORMAL LOW (ref >90)
GFR calc non Af Amer: 72 mL/min — ABNORMAL LOW (ref >90)
GFR calc non Af Amer: 71 mL/min — ABNORMAL LOW (ref >90)
Glucose, Bld: 111 mg/dL — ABNORMAL HIGH (ref 70–99)
Sodium: 142 mEq/L (ref 135–145)
Sodium: 145 mEq/L (ref 135–145)
Total Bilirubin: 0.2 mg/dL — ABNORMAL LOW (ref 0.3–1.2)

## 2013-04-11 LAB — CBC WITH DIFFERENTIAL/PLATELET
Basophils Absolute: 0 10*3/uL (ref 0.0–0.1)
Basophils Relative: 1 % (ref 0–1)
Eosinophils Relative: 3 % (ref 0–5)
HCT: 35.1 % — ABNORMAL LOW (ref 36.0–46.0)
Lymphocytes Relative: 29 % (ref 12–46)
MCH: 28.3 pg (ref 26.0–34.0)
MCHC: 32.8 g/dL (ref 30.0–36.0)
Neutro Abs: 4.6 10*3/uL (ref 1.7–7.7)
Neutrophils Relative %: 58 % (ref 43–77)
RBC: 4.07 MIL/uL (ref 3.87–5.11)
RDW: 14.5 % (ref 11.5–15.5)
WBC: 8 10*3/uL (ref 4.0–10.5)

## 2013-04-11 LAB — CBC
HCT: 34.6 % — ABNORMAL LOW (ref 36.0–46.0)
Hemoglobin: 11.2 g/dL — ABNORMAL LOW (ref 12.0–15.0)
MCH: 28 pg (ref 26.0–34.0)
MCV: 86.5 fL (ref 78.0–100.0)
RBC: 4 MIL/uL (ref 3.87–5.11)

## 2013-04-11 LAB — APTT: aPTT: 30 seconds (ref 24–37)

## 2013-04-11 LAB — PROTIME-INR
INR: 1.02 (ref 0.00–1.49)
Prothrombin Time: 13.2 seconds (ref 11.6–15.2)

## 2013-04-11 LAB — RAPID URINE DRUG SCREEN, HOSP PERFORMED
Barbiturates: NOT DETECTED
Tetrahydrocannabinol: NOT DETECTED

## 2013-04-11 LAB — URINALYSIS, ROUTINE W REFLEX MICROSCOPIC
Bilirubin Urine: NEGATIVE
Protein, ur: NEGATIVE mg/dL
Urobilinogen, UA: 0.2 mg/dL (ref 0.0–1.0)

## 2013-04-11 LAB — GLUCOSE, CAPILLARY

## 2013-04-11 LAB — HEMOGLOBIN A1C
Hgb A1c MFr Bld: 7 % — ABNORMAL HIGH (ref <5.7)
Mean Plasma Glucose: 154 mg/dL — ABNORMAL HIGH (ref <117)

## 2013-04-11 LAB — ETHANOL: Alcohol, Ethyl (B): <11 mg/dL (ref 0–11)

## 2013-04-11 LAB — PHOSPHORUS: Phosphorus: 4 mg/dL (ref 2.3–4.6)

## 2013-04-11 LAB — MAGNESIUM: Magnesium: 1.7 mg/dL (ref 1.5–2.5)

## 2013-04-11 MED ORDER — METOPROLOL SUCCINATE ER 25 MG PO TB24
25.0000 mg | ORAL_TABLET | Freq: Every day | ORAL | Status: DC
  Administered 2013-04-11 – 2013-04-12 (×2): 25 mg via ORAL
  Filled 2013-04-11 (×2): qty 1

## 2013-04-11 MED ORDER — ALPRAZOLAM 1 MG PO TABS
1.0000 mg | ORAL_TABLET | Freq: Three times a day (TID) | ORAL | Status: DC | PRN
  Administered 2013-04-12 – 2013-04-13 (×3): 1 mg via ORAL
  Filled 2013-04-11 (×3): qty 1

## 2013-04-11 MED ORDER — ONDANSETRON HCL 4 MG/2ML IJ SOLN: 4.0000 mg | Freq: Four times a day (QID) | INTRAMUSCULAR | Status: DC | PRN

## 2013-04-11 MED ORDER — PNEUMOCOCCAL VAC POLYVALENT 25 MCG/0.5ML IJ INJ
0.5000 mL | INJECTION | INTRAMUSCULAR | Status: AC
  Administered 2013-04-12: 0.5 mL via INTRAMUSCULAR
  Filled 2013-04-11 (×2): qty 0.5

## 2013-04-11 MED ORDER — ALBUTEROL SULFATE HFA 108 (90 BASE) MCG/ACT IN AERS
2.0000 | INHALATION_SPRAY | Freq: Four times a day (QID) | RESPIRATORY_TRACT | Status: DC | PRN
  Administered 2013-04-13 – 2013-04-16 (×4): 2 via RESPIRATORY_TRACT
  Filled 2013-04-11: qty 6.7

## 2013-04-11 MED ORDER — HYDROMORPHONE HCL PF 1 MG/ML IJ SOLN
0.5000 mg | INTRAMUSCULAR | Status: DC | PRN
  Administered 2013-04-12 (×2): 0.5 mg via INTRAVENOUS
  Filled 2013-04-11 (×2): qty 1

## 2013-04-11 MED ORDER — ACETAMINOPHEN 650 MG RE SUPP: 650.0000 mg | Freq: Four times a day (QID) | RECTAL | Status: DC | PRN

## 2013-04-11 MED ORDER — DEXTROSE 5 % IV SOLN
1.0000 g | INTRAVENOUS | Status: DC
  Filled 2013-04-11: qty 10

## 2013-04-11 MED ORDER — ATORVASTATIN CALCIUM 20 MG PO TABS
20.0000 mg | ORAL_TABLET | Freq: Every day | ORAL | Status: DC
  Administered 2013-04-11 – 2013-04-16 (×6): 20 mg via ORAL
  Filled 2013-04-11 (×6): qty 1

## 2013-04-11 MED ORDER — SODIUM CHLORIDE 0.9 % IV SOLN
INTRAVENOUS | Status: AC
  Administered 2013-04-11: 11:00:00 via INTRAVENOUS

## 2013-04-11 MED ORDER — FLUTICASONE PROPIONATE HFA 44 MCG/ACT IN AERO
2.0000 | INHALATION_SPRAY | Freq: Two times a day (BID) | RESPIRATORY_TRACT | Status: DC
  Administered 2013-04-11 – 2013-04-16 (×10): 2 via RESPIRATORY_TRACT
  Filled 2013-04-11: qty 10.6

## 2013-04-11 MED ORDER — ACETAMINOPHEN 325 MG PO TABS
650.0000 mg | ORAL_TABLET | Freq: Four times a day (QID) | ORAL | Status: DC | PRN
  Administered 2013-04-11 – 2013-04-13 (×3): 650 mg via ORAL
  Filled 2013-04-11 (×3): qty 2

## 2013-04-11 MED ORDER — INFLUENZA VAC SPLIT QUAD 0.5 ML IM SUSP
0.5000 mL | INTRAMUSCULAR | Status: AC
  Administered 2013-04-12: 0.5 mL via INTRAMUSCULAR
  Filled 2013-04-11 (×2): qty 0.5

## 2013-04-11 MED ORDER — FUROSEMIDE 20 MG PO TABS
20.0000 mg | ORAL_TABLET | Freq: Every day | ORAL | Status: DC
  Administered 2013-04-11 – 2013-04-16 (×6): 20 mg via ORAL
  Filled 2013-04-11 (×6): qty 1

## 2013-04-11 MED ORDER — AMLODIPINE BESYLATE 10 MG PO TABS
10.0000 mg | ORAL_TABLET | Freq: Every day | ORAL | Status: DC
  Administered 2013-04-11 – 2013-04-16 (×6): 10 mg via ORAL
  Filled 2013-04-11 (×6): qty 1

## 2013-04-11 MED ORDER — IOHEXOL 300 MG/ML  SOLN
125.0000 mL | Freq: Once | INTRAMUSCULAR | Status: AC | PRN
  Administered 2013-04-11: 125 mL via INTRAVENOUS

## 2013-04-11 MED ORDER — IOHEXOL 300 MG/ML  SOLN
50.0000 mL | Freq: Once | INTRAMUSCULAR | Status: AC | PRN
  Administered 2013-04-11: 50 mL via ORAL

## 2013-04-11 MED ORDER — ONDANSETRON HCL 4 MG PO TABS: 4.0000 mg | ORAL_TABLET | Freq: Four times a day (QID) | ORAL | Status: DC | PRN

## 2013-04-11 MED ORDER — METFORMIN HCL 500 MG PO TABS
500.0000 mg | ORAL_TABLET | Freq: Every day | ORAL | Status: DC
  Filled 2013-04-11: qty 1

## 2013-04-11 MED ORDER — INSULIN ASPART 100 UNIT/ML ~~LOC~~ SOLN
0.0000 [IU] | Freq: Three times a day (TID) | SUBCUTANEOUS | Status: DC
  Administered 2013-04-13 – 2013-04-14 (×2): 1 [IU] via SUBCUTANEOUS

## 2013-04-11 MED ORDER — ASPIRIN EC 81 MG PO TBEC
81.0000 mg | DELAYED_RELEASE_TABLET | Freq: Every day | ORAL | Status: DC
  Administered 2013-04-11 – 2013-04-16 (×6): 81 mg via ORAL
  Filled 2013-04-11 (×6): qty 1

## 2013-04-11 MED ORDER — DEXTROSE 5 % IV SOLN
1.0000 g | Freq: Once | INTRAVENOUS | Status: DC
  Administered 2013-04-11: 1 g via INTRAVENOUS
  Filled 2013-04-11: qty 10

## 2013-04-11 NOTE — ED Provider Notes (Signed)
CSN: [id number]     Arrival date & time 04/11/13  0445 History   First MD Initiated Contact with Patient 04/11/13 0700     Chief Complaint  Patient presents with  . Altered Mental Status  . Abdominal Pain   (Consider location/radiation/quality/duration/timing/severity/associated sxs/prior Treatment) HPI Comments: Patient brought to the ER by EMS for multiple complaints. Patient initially complained of abdominal pain with nausea but no vomiting. The symptoms began yesterday. Patient reports that the pain is moderate to severe. It is her entire abdomen. Upon arrival to the ER, however, patient has multiple additional complaints. She reports she has a headache, patient reports that this is been going on for weeks. She does have a history of recurrent headaches and migraines. Patient also recently had a stroke. Patient is complaining of weakness in her extremities, especially the arms. The weakness is not unilateral. She does not have any associated numbness. Patient now stating that she is hurting "all over". Patient also noted to be slow to respond to questions. She says she has had some memory loss, initially was not sure where she was when she arrived at hospital.  Patient is a 52 y.o. female presenting with altered mental status and abdominal pain.  Altered Mental Status Presenting symptoms: confusion   Associated symptoms: abdominal pain, headaches, nausea and weakness   Associated symptoms: no fever   Abdominal Pain Associated symptoms: nausea   Associated symptoms: no chest pain, no fever and no shortness of breath     Past Medical History  Diagnosis Date  . Hypertension   . Stroke   . Diabetes mellitus without complication   . Anemia    Past Surgical History  Procedure Laterality Date  . Leg surgery Right 2007    Surgery x3   Family History  Problem Relation Age of Onset  . Leukemia Brother    History  Substance Use Topics  . Smoking status: Never Smoker   . Smokeless  tobacco: Never Used  . Alcohol Use: No   OB History   Grav Para Term Preterm Abortions TAB SAB Ect Mult Living                 Review of Systems  Constitutional: Negative for fever.  Respiratory: Negative for shortness of breath.   Cardiovascular: Negative for chest pain.  Gastrointestinal: Positive for nausea and abdominal pain.  Neurological: Positive for weakness and headaches.  Psychiatric/Behavioral: Positive for confusion.  All other systems reviewed and are negative.    Allergies  Morphine and related  Home Medications   Current Outpatient Rx  Name  Route  Sig  Dispense  Refill  . albuterol (PROVENTIL HFA;VENTOLIN HFA) 108 (90 BASE) MCG/ACT inhaler   Inhalation   Inhale 2 puffs into the lungs every 6 (six) hours as needed for wheezing.         Marland Kitchen ALPRAZolam (XANAX) 1 MG tablet   Oral   Take 1 mg by mouth 3 (three) times daily as needed for sleep or anxiety.         Marland Kitchen amLODipine (NORVASC) 10 MG tablet   Oral   Take 10 mg by mouth daily.          Marland Kitchen aspirin EC 81 MG tablet   Oral   Take 81 mg by mouth daily.         Marland Kitchen atorvastatin (LIPITOR) 20 MG tablet   Oral   Take 20 mg by mouth daily.         Marland Kitchen  beclomethasone (QVAR) 80 MCG/ACT inhaler   Inhalation   Inhale 1 puff into the lungs 2 (two) times daily as needed (for wheezing).          . hydrochlorothiazide (HYDRODIURIL) 25 MG tablet   Oral   Take 25 mg by mouth daily.         . metFORMIN (GLUCOPHAGE) 500 MG tablet   Oral   Take 500 mg by mouth daily with breakfast.         . metoprolol succinate (TOPROL-XL) 25 MG 24 hr tablet   Oral   Take 25 mg by mouth daily.         . furosemide (LASIX) 20 MG tablet   Oral   Take 20 mg by mouth daily.          BP 118/77  Pulse 90  Temp(Src) 97.9 F (36.6 C) (Oral)  Resp 22  SpO2 98% Physical Exam  Constitutional: She is oriented to person, place, and time. She appears well-developed and well-nourished. No distress.  HENT:  Head:  Normocephalic and atraumatic.  Right Ear: Hearing normal.  Left Ear: Hearing normal.  Nose: Nose normal.  Mouth/Throat: Oropharynx is clear and moist and mucous membranes are normal.  Eyes: Conjunctivae and EOM are normal. Pupils are equal, round, and reactive to light.  Neck: Normal range of motion. Neck supple.  Cardiovascular: Regular rhythm, S1 normal and S2 normal.  Exam reveals no gallop and no friction rub.   No murmur heard. Pulmonary/Chest: Effort normal and breath sounds normal. No respiratory distress. She exhibits no tenderness.  Abdominal: Soft. Normal appearance and bowel sounds are normal. There is no hepatosplenomegaly. There is tenderness in the right upper quadrant, right lower quadrant, epigastric area and periumbilical area. There is no rebound, no guarding, no tenderness at McBurney's point and negative Murphy's sign. No hernia.  Musculoskeletal: Normal range of motion.  Neurological: She is alert and oriented to person, place, and time. She has normal strength. No cranial nerve deficit or sensory deficit. Coordination normal. GCS eye subscore is 4. GCS verbal subscore is 5. GCS motor subscore is 6.  Skin: Skin is warm, dry and intact. No rash noted. No cyanosis.  Psychiatric: She has a normal mood and affect. Her speech is normal and behavior is normal. Thought content normal.    ED Course  Procedures (including critical care time) Labs Review Labs Reviewed  CBC - Abnormal; Notable for the following:    Hemoglobin 11.2 (*)    HCT 34.6 (*)    All other components within normal limits  COMPREHENSIVE METABOLIC PANEL - Abnormal; Notable for the following:    Glucose, Bld 111 (*)    Albumin 3.3 (*)    Total Bilirubin 0.1 (*)    GFR calc non Af Amer 72 (*)    GFR calc Af Amer 83 (*)    All other components within normal limits  LIPASE, BLOOD  URINALYSIS, ROUTINE W REFLEX MICROSCOPIC  POCT I-STAT TROPONIN I   Imaging Review Ct Head Wo Contrast  04/11/2013    *RADIOLOGY REPORT*  Clinical Data: Altered mental status  CT HEAD WITHOUT CONTRAST  Technique:  Contiguous axial images were obtained from the base of the skull through the vertex without contrast.  Comparison: Prior CT from 03/13/2013  Findings: There is no acute intracranial hemorrhage or infarct. Gray-white matter differentiation is preserved. Confluent hypodensity within the periventricular and deep white matter is consistent with chronic microvascular ischemic disease, not significantly changed as compared to  the prior examination.  No mass or midline shift.  CSF containing spaces are within normal limits.  No extra-axial fluid collection.  Calvarium is intact. Orbital soft tissues are within normal limits.  Paranasal sinuses and mastoid air cells are clear.  IMPRESSION: Mild chronic microvascular ischemic changes, unchanged as compared to prior examination.  No acute intracranial process.   Original Report Authenticated By: Rise Mu, M.D.    MDM  Diagnosis: 1. Abdominal pain 2. Possible pyelonephritis 3. Mental status changes 4. Headache  She arrived to the ER for multiple problems. Patient was initially brought in for evaluation of abdominal pain with nausea. Patient had diffuse abdominal pain, predominantly right-sided exam. No signs of peritonitis. Blood work did not show any acute problems. CT scan was ordered. Patient has an area of the lower pole the right kidney that shows hyperperfusion that is possibly consistent with acute pyelonephritis. Urinalysis is equivocal, will need to treat empirically.  Patient also complaining of headache. Reviewing her records reveals that she has been seen here multiple times for migraine headache in the past. Head CT was performed because there is now also confusion associated with the symptoms. Patient was moderately confused at arrival, did not know where she was. This is a change from her baseline. CT scan showed chronic changes without any acute  findings.  Based on the patient's acute mental status changes of unclear etiology and ongoing abdominal pain possible pyelonephritis seen on CT scan, will treat empirically with Rocephin and ask hospitalist to observe in the hospital for further evaluation. Blood and urine cultures pending.   Gilda Crease, MD 04/11/13 [phone]

## 2013-04-11 NOTE — H&P (Signed)
[facility] History and Physical  Keirston Saephanh AVW:[id number] DOB: Jul 01, 1961 DOA: 04/11/2013  Referring physician: ER physician PCP: Egbert Garibaldi, NP   Chief Complaint: Mental status changes, headache  HPI:  52 year old female with past medical history of CVA, dyslipidemia, diabetes and hypertension who presented to [facility] ED from [facility] with complaints of ongoing abdominal pain, nausea for past few weeks prior to this admission. Patient reported to have intermittent abdominal pain, diffuse in mid abdomen region, occasionally sharp in nature, 6/10 in intensity. Patient had associated nausea but no vomiting. Patient also reported having headaches for past few days. On arrival to emergency room patient was disoriented and while she still does not recall the date she is oriented to self. No complaints of chest pain or shortness of breath. No blood in the stool or urine. No fever or chills. No diarrhea or constipation. In ED, vital signs are stable with blood pressure 118/77, heart rate 90 and T max 97.9 F. CBC revealed hemoglobin of 11.2 and BMP was unremarkable. Urinalysis was negative but CT abdomen revealed likely pyelonephritis. Patient was started on Rocephin in ED.  Assessment and plan:  Principal Problem:   Encephalopathy acute - Mental status improving. Likely initial mental status changes due to pyelonephritis - CT head did not reveal acute intracranial findings - check UDS and ethanol level  Active Problems:   Acute pyelonephritis - Continue Rocephin. - Follow up urine culture results   Diabetes - Hold metformin in the setting of acute pyelonephritis - Check A1c - May use sliding scale insulin   Dyslipidemia - Continue Lipitor 20 mg daily   H/O CVA (cerebral infarction) - Continue aspirin   HTN (hypertension) -  Continue Norvasc, metoprolol   Anemia - Hemoglobin 11.2 on the admission - No indications for transfusion  Radiological Exams on  Admission: Ct Head Wo Contrast 04/11/2013     IMPRESSION: Mild chronic microvascular ischemic changes, unchanged as compared to prior examination.  No acute intracranial process.   Original Report Authenticated By: Rise Mu, M.D.   Ct Abdomen Pelvis W Contrast 04/11/2013    IMPRESSION: 1. Small portion of the cortex of the lower pole of the right kidney demonstrates hypoperfusion. Focal pyelonephritis cannot be excluded in the appropriate setting, but this certainly not definite. There is an adjacent water density lesion again the lower pole of the right kidney that is most likely a simple cyst. There are multiple areas of cortical scarring in the right kidney suggesting prior infections and/or history of vesicoureteral reflux. Question if the patient has right flank pain? 2. 2 punctate right renal stones, nonobstructive. 3. Uterine fibroid.  4. Mild colonic diverticulosis, uncomplicated.     Code Status: Full Family Communication: Pt at bedside Disposition Plan: Admit for further evaluation  Manson Passey, MD  [facility] Pager [phone]  Review of Systems:  Constitutional: Negative for fever, chills and malaise/fatigue. Negative for diaphoresis.  HENT: Negative for hearing loss, ear pain, nosebleeds, congestion, sore throat, neck pain, tinnitus and ear discharge.   Eyes: Negative for blurred vision, double vision, photophobia, pain, discharge and redness.  Respiratory: Negative for cough, hemoptysis, sputum production, shortness of breath, wheezing and stridor.   Cardiovascular: Negative for chest pain, palpitations, orthopnea, claudication and leg swelling.  Gastrointestinal: Negative for nausea, vomiting and abdominal pain. Negative for heartburn, constipation, blood in stool and melena.  Genitourinary: Negative for dysuria, urgency, frequency, hematuria and flank pain.  Musculoskeletal: Negative for myalgias, back pain, joint pain and falls.  Skin:  Negative for itching and  rash.  Neurological: Negative for dizziness and weakness. Negative for tingling, tremors, sensory change, speech change, focal weakness, loss of consciousness and headaches.  Endo/Heme/Allergies: Negative for environmental allergies and polydipsia. Does not bruise/bleed easily.  Psychiatric/Behavioral: Negative for suicidal ideas. The patient is not nervous/anxious.      Past Medical History  Diagnosis Date  . Hypertension   . Stroke   . Diabetes mellitus without complication   . Anemia    Past Surgical History  Procedure Laterality Date  . Leg surgery Right 2007    Surgery x3   Social History:  reports that she has never smoked. She has never used smokeless tobacco. She reports that she does not drink alcohol or use illicit drugs.  Allergies  Allergen Reactions  . Morphine And Related Hives    Family History  Problem Relation Age of Onset  . Leukemia Brother      Prior to Admission medications   Medication Sig Start Date End Date Taking? Authorizing Provider  albuterol (PROVENTIL HFA;VENTOLIN HFA) 108 (90 BASE) MCG/ACT inhaler Inhale 2 puffs into the lungs every 6 (six) hours as needed for wheezing.   Yes Historical Provider, MD  ALPRAZolam Prudy Feeler) 1 MG tablet Take 1 mg by mouth 3 (three) times daily as needed for sleep or anxiety.   Yes Historical Provider, MD  amLODipine (NORVASC) 10 MG tablet Take 10 mg by mouth daily.    Yes Historical Provider, MD  aspirin EC 81 MG tablet Take 81 mg by mouth daily.   Yes Historical Provider, MD  atorvastatin (LIPITOR) 20 MG tablet Take 20 mg by mouth daily.   Yes Historical Provider, MD  beclomethasone (QVAR) 80 MCG/ACT inhaler Inhale 1 puff into the lungs 2 (two) times daily as needed (for wheezing).    Yes Historical Provider, MD  hydrochlorothiazide (HYDRODIURIL) 25 MG tablet Take 25 mg by mouth daily.   Yes Historical Provider, MD  metFORMIN (GLUCOPHAGE) 500 MG tablet Take 500 mg by mouth daily with breakfast.   Yes Historical  Provider, MD  metoprolol succinate (TOPROL-XL) 25 MG 24 hr tablet Take 25 mg by mouth daily.   Yes Historical Provider, MD  furosemide (LASIX) 20 MG tablet Take 20 mg by mouth daily.    Historical Provider, MD   Physical Exam: Filed Vitals:   04/11/13 0507  BP: 118/77  Pulse: 90  Temp: 97.9 F (36.6 C)  TempSrc: Oral  Resp: 22  SpO2: 98%    Physical Exam  Constitutional: Appears well-developed and well-nourished. No distress.  HENT: Normocephalic. External right and left ear normal. Oropharynx is clear and moist.  Eyes: Conjunctivae and EOM are normal. PERRLA, no scleral icterus.  Neck: Normal ROM. Neck supple. No JVD. No tracheal deviation. No thyromegaly.  CVS: RRR, S1/S2 appreciated Pulmonary: Effort and breath sounds normal, no stridor, rhonchi, wheezes, rales.  Abdominal: Soft. BS +,  abdomen tender across mid abdomen with no rebound tenderness.  Musculoskeletal: Normal range of motion. No edema and no tenderness.  Lymphadenopathy: No lymphadenopathy noted, cervical, inguinal. Neuro: Alert. No focal neurological deficits Skin: Skin is warm and dry. No rash noted. Not diaphoretic. No erythema. No pallor.  Psychiatric: Normal mood and affect. Behavior, judgment, thought content normal.   Labs on Admission:  Basic Metabolic Panel:  Recent Labs Lab 04/11/13 0545  NA 142  K 3.7  CL 108  CO2 26  GLUCOSE 111*  BUN 14  CREATININE 0.91  CALCIUM 9.4   Liver Function Tests:  Recent Labs Lab 04/11/13 0545  AST 18  ALT 20  ALKPHOS 79  BILITOT 0.1*  PROT 6.8  ALBUMIN 3.3*    Recent Labs Lab 04/11/13 0545  LIPASE 47   No results found for this basename: AMMONIA,  in the last 168 hours CBC:  Recent Labs Lab 04/11/13 0545  WBC 8.1  HGB 11.2*  HCT 34.6*  MCV 86.5  PLT 370   Cardiac Enzymes: No results found for this basename: CKTOTAL, CKMB, CKMBINDEX, TROPONINI,  in the last 168 hours BNP: No components found with this basename: POCBNP,  CBG: No  results found for this basename: GLUCAP,  in the last 168 hours  If 7PM-7AM, please contact night-coverage [email or web address] Password [email or web address] 04/11/2013, 10:14 AM

## 2013-04-11 NOTE — ED Notes (Signed)
Bed: WA09 Expected date: 04/11/13 Expected time: 4:27 AM Means of arrival: Ambulance Comments: abd pain

## 2013-04-11 NOTE — ED Notes (Signed)
Brought in by EMS from [facility] with c/o abdominal pain with nausea but no vomiting, onset yesterday; pt also c/o headache that has been on-going for weeks.  Pt has hx of stroke.

## 2013-04-11 NOTE — Progress Notes (Signed)
   CARE MANAGEMENT ED NOTE 04/11/2013  Patient:  Yesenia [name], Yesenia [name]   Account Number:  [id number]  Date Initiated:  04/11/2013  Documentation initiated by:  Edd Arbour  Subjective/Objective Assessment:   52 year old female medicaid [place] access abdominal pain, headache, from [facility] nausea, oriented x 1 imaging indicates pyelonephritis     Subjective/Objective Assessment Detail:     Action/Plan:   UR completed   Action/Plan Detail:   Anticipated DC Date:  04/14/2013     Status Recommendation to Physician:   Result of Recommendation:    Other ED Services  Consult Working Plan    DC Planning Services  Other    Choice offered to / List presented to:            Status of service:  Completed, signed off  ED Comments:   ED Comments Detail:

## 2013-04-12 LAB — COMPREHENSIVE METABOLIC PANEL
ALT: 17 U/L (ref 0–35)
AST: 16 U/L (ref 0–37)
Alkaline Phosphatase: 74 U/L (ref 39–117)
GFR calc Af Amer: 68 mL/min — ABNORMAL LOW (ref >90)
Glucose, Bld: 111 mg/dL — ABNORMAL HIGH (ref 70–99)
Potassium: 3.5 mEq/L (ref 3.5–5.1)
Sodium: 142 mEq/L (ref 135–145)
Total Bilirubin: 0.2 mg/dL — ABNORMAL LOW (ref 0.3–1.2)
Total Protein: 6.8 g/dL (ref 6.0–8.3)

## 2013-04-12 LAB — CBC
HCT: 35.3 % — ABNORMAL LOW (ref 36.0–46.0)
Hemoglobin: 11.5 g/dL — ABNORMAL LOW (ref 12.0–15.0)
MCH: 28.1 pg (ref 26.0–34.0)
MCHC: 32.6 g/dL (ref 30.0–36.0)
MCV: 86.3 fL (ref 78.0–100.0)
Platelets: 370 10*3/uL (ref 150–400)
RDW: 14.7 % (ref 11.5–15.5)

## 2013-04-12 LAB — GLUCOSE, CAPILLARY
Glucose-Capillary: 109 mg/dL — ABNORMAL HIGH (ref 70–99)
Glucose-Capillary: 107 mg/dL — ABNORMAL HIGH (ref 70–99)

## 2013-04-12 LAB — URINE CULTURE: Colony Count: NO GROWTH

## 2013-04-12 LAB — TSH: TSH: 0.502 u[IU]/mL (ref 0.350–4.500)

## 2013-04-12 MED ORDER — DEXTROSE 5 % IV SOLN
2.0000 g | INTRAVENOUS | Status: DC
  Administered 2013-04-12: 2 g via INTRAVENOUS
  Filled 2013-04-12 (×2): qty 2

## 2013-04-12 MED ORDER — METOPROLOL SUCCINATE 12.5 MG HALF TABLET
12.5000 mg | ORAL_TABLET | Freq: Every day | ORAL | Status: DC
Start: 2013-04-13 — End: 2013-04-13
  Administered 2013-04-13: 12.5 mg via ORAL
  Filled 2013-04-12: qty 1

## 2013-04-12 NOTE — Progress Notes (Signed)
Rehab Admissions Coordinator Note:  Patient was screened by Trish Mage for appropriateness for an Inpatient Acute Rehab Consult. PT/OT recommending CIR.   At this time, we are recommending Inpatient Rehab consult.  Trish Mage 04/12/2013, 1:57 PM  I can be reached at [phone].

## 2013-04-12 NOTE — Evaluation (Signed)
Physical Therapy Evaluation Patient Details Name: Yesenia [name] MRN: [id number] DOB: [birth year] Today's Date: 04/12/2013 Time: 4098-1191 PT Time Calculation (min): 19 min  PT Assessment / Plan / Recommendation History of Present Illness  52 yo female admitted with acute renal failure, falls. Hx of CVA. 52 year old female with past medical history of CVA, dyslipidemia, diabetes and hypertension who presented to [facility] ED from [facility] with complaints of ongoing abdominal pain, nausea for past few weeks prior to this admission.  no vomiting. Patient also reported having headaches for past few days. On arrival to emergency room patient was disoriented and while she still does not recall the date she is oriented to self.   Clinical Impression  Pt was able to mobilize to recliner very cautiosly and slowly. Pt will benefit from PT to address problem list below. Pt will benefitr from post acute rehab at DC.    PT Assessment  Patient needs continued PT services    Follow Up Recommendations  CIR;SNF;Supervision/Assistance - 24 hour (uncertain) if DC home  Then ? HH.    Does the patient have the potential to tolerate intense rehabilitation      Barriers to Discharge Decreased caregiver support      Equipment Recommendations  None recommended by PT    Recommendations for Other Services     Frequency Min 3X/week    Precautions / Restrictions Precautions Precautions: Fall   Pertinent Vitals/Pain Pt reports pain all over.      Mobility  Bed Mobility Bed Mobility: Supine to Sit Supine to Sit: 3: Mod assist;HOB elevated;With rails Details for Bed Mobility Assistance: cues for rolling to self assist , extra time required as pt reports pain all over. Transfers Sit to Stand: From bed;1: +2 Total assist Sit to Stand: Patient Percentage: 60% Stand to Sit: 1: +2 Total assist;To chair/3-in-1;With upper extremity assist;With armrests Stand to Sit: Patient Percentage: 50% Details for Transfer  Assistance: extra time to get up to standing. Pt leaned forward very far in order to sit down. Ambulation/Gait Ambulation/Gait Assistance: 1: +2 Total assist Ambulation/Gait: Patient Percentage: 60% Ambulation Distance (Feet): 5 Feet Assistive device: Rolling walker Ambulation/Gait Assistance Details: pt appeared to have difficulty picking up feet. very short steps taken Gait Pattern: Trunk flexed;Step-to pattern;Decreased step length - right;Decreased step length - left    Exercises     PT Diagnosis: Difficulty walking;Generalized weakness;Acute pain  PT Problem List: Decreased strength;Decreased range of motion;Decreased activity tolerance;Decreased balance;Decreased mobility;Decreased coordination;Decreased safety awareness;Decreased knowledge of use of DME;Obesity PT Treatment Interventions: DME instruction;Gait training;Functional mobility training;Therapeutic activities;Therapeutic exercise;Patient/family education     PT Goals(Current goals can be found in the care plan section) Acute Rehab PT Goals Patient Stated Goal: get well  PT Goal Formulation: With patient Time For Goal Achievement: 04/26/13 Potential to Achieve Goals: Good  Visit Information  Last PT Received On: 04/12/13 Assistance Needed: +2 History of Present Illness: 52 yo female admitted with acute renal failure, falls. Hx of CVA. 52 year old female with past medical history of CVA, dyslipidemia, diabetes and hypertension who presented to [facility] ED from [facility] with complaints of ongoing abdominal pain, nausea for past few weeks prior to this admission. Patient reported to have intermittent abdominal pain, diffuse in mid abdomen region, occasionally sharp in nature, 6/10 in intensity. Patient had associated nausea but no vomiting. Patient also reported having headaches for past few days. On arrival to emergency room patient was disoriented and while she still does not recall the date she is oriented to  self. No  complaints of chest pain or shortness of breath. No blood in the stool or urine. No fever or chills. No diarrhea or constipation.       Prior Functioning  Home Living Family/patient expects to be discharged to:: Unsure Additional Comments: pt states she may have option of going to her daugthers Prior Function Level of Independence: Needs assistance Gait / Transfers Assistance Needed: Pt states she requires +1 assist from boyfriend. When pt does not have assistance, she falls. Reports multiple falls within last few days since d/c from [facility], walks with cane or RW Comments: takes a stand up shower, reports she would not be able to get out of tub Communication Communication: No difficulties    Cognition  Cognition Arousal/Alertness: Awake/alert Behavior During Therapy: WFL for tasks assessed/performed;Flat affect Overall Cognitive Status: Within Functional Limits for tasks assessed    Extremity/Trunk Assessment Upper Extremity Assessment Upper Extremity Assessment: RUE deficits/detail RUE Coordination: decreased gross motor Lower Extremity Assessment Lower Extremity Assessment: Generalized weakness   Balance    End of Session PT - End of Session Activity Tolerance: Patient limited by fatigue;Patient limited by pain Patient left: in chair;with call bell/phone within reach Nurse Communication: Mobility status  GP     Rada Hay 04/12/2013, 1:37 PM

## 2013-04-12 NOTE — Progress Notes (Signed)
OT spoke with pts daughter, Christella Scheuermann, who wants pt to come to her house. Her cell is [phone]. Daughter states she will need a hospital bed and bed side commode for pt to be at her home. Daughter can provide 24/7 A.  May benefit pt to go to inpatient rehab prior to DC to daughters home. Lise Auer, OT

## 2013-04-12 NOTE — Progress Notes (Signed)
Yesenia [name] [id number]:[id number] DOB: 1961/04/19 DOA: 04/11/2013 PCP: Egbert Garibaldi, NP  Brief narrative:  52 yr old female, recent admit 01/2013 Pyelo admitted with altered mental status and confusion.  She underwent CT scan of the head as well as abdomen pelvis which was significant for focal area of potential pyelonephritis vs. simple renal cyst  Past medical history-As per Problem list Chart reviewed as below- Ed visit 9/2 weakness Stress by Dr. Rennis Golden 02/23/13 low risk Admission 01/23/13 c E.coli UTI/AKI, prior TIA-noted that she was recommended repeat MRI in 6 months from this admission Admission 01/14/13 c TIa  Consultants:  none  Procedures:  Ct abd pelvis  Antibiotics:  Ceftriaxone 10/1   Subjective  Still feels weak and poorly Decreased appetite  Further nausea vomiting however does not really feel her eating Tolerating diet fairly only No cough no cold, does have some small amount of stool but has not eaten properly in the past 2 Denies chest pain shortness of breath   Objective    Interim History: None  Telemetry: None  Objective: Filed Vitals:   04/12/13 0545 04/12/13 0806 04/12/13 0827 04/12/13 1100  BP:  107/84  96/71  Pulse:  79  93  Temp:  98.6 F (37 C)  98.6 F (37 C)  TempSrc:  Oral  Oral  Resp:    18  Height:      Weight: 144.7 kg (319 lb 0.1 oz)     SpO2:  98% 91% 98%    Intake/Output Summary (Last 24 hours) at 04/12/13 1305 Last data filed at 04/11/13 1900  Gross per 24 hour  Intake 611.25 ml  Output    750 ml  Net -138.75 ml    Exam:  General: Alert oriented EOMI NCAT Cardiovascular: S1-S2 no murmur rub or gallop Respiratory: Clinically clear Abdomen: Soft nontender nondistended no rebound or guarding, no CVA tenderness Skin lower extremity is intact with no rash no swelling Neuro moves all 4 limbs equally  Data Reviewed: Basic Metabolic Panel:  Recent Labs Lab 04/11/13 0545 04/11/13 1354 04/12/13 0525  NA 142 145  142  K 3.7 3.8 3.5  CL 108 106 104  CO2 26 28 26   GLUCOSE 111* 107* 111*  BUN 14 12 16   CREATININE 0.91 0.92 1.07  CALCIUM 9.4 8.8 9.1  MG  --  1.7  --   PHOS  --  4.0  --    Liver Function Tests:  Recent Labs Lab 04/11/13 0545 04/11/13 1354 04/12/13 0525  AST 18 16 16   ALT 20 18 17   ALKPHOS 79 77 74  BILITOT 0.1* 0.2* 0.2*  PROT 6.8 7.2 6.8  ALBUMIN 3.3* 3.5 3.3*    Recent Labs Lab 04/11/13 0545  LIPASE 47   No results found for this basename: AMMONIA,  in the last 168 hours CBC:  Recent Labs Lab 04/11/13 0545 04/11/13 1354 04/12/13 0525  WBC 8.1 8.0 7.3  NEUTROABS  --  4.6  --   HGB 11.2* 11.5* 11.5*  HCT 34.6* 35.1* 35.3*  MCV 86.5 86.2 86.3  PLT 370 375 370   Cardiac Enzymes: No results found for this basename: CKTOTAL, CKMB, CKMBINDEX, TROPONINI,  in the last 168 hours BNP: No components found with this basename: POCBNP,  CBG:  Recent Labs Lab 04/11/13 1623 04/11/13 2110 04/12/13 0719 04/12/13 1102  GLUCAP 85 115* 109* 134*    Recent Results (from the past 240 hour(s))  CULTURE, BLOOD (ROUTINE X 2)     Status: None  Collection Time    04/11/13 10:00 AM      Result Value Range Status   Specimen Description BLOOD LEFT ANTECUBITAL   Final   Special Requests BOTTLES DRAWN AEROBIC AND ANAEROBIC   Final   Culture  Setup Time     Final   Value: 04/11/2013 14:05     Performed at [facility]   Culture     Final   Value:        BLOOD CULTURE RECEIVED NO GROWTH TO DATE CULTURE WILL BE HELD FOR 5 DAYS BEFORE ISSUING A FINAL NEGATIVE REPORT     Performed at [facility]   Report Status PENDING   Incomplete  CULTURE, BLOOD (ROUTINE X 2)     Status: None   Collection Time    04/11/13 10:05 AM      Result Value Range Status   Specimen Description BLOOD RIGHT ANTECUBITAL   Final   Special Requests BOTTLES DRAWN AEROBIC AND ANAEROBIC   Final   Culture  Setup Time     Final   Value: 04/11/2013 14:05     Performed at  [facility]   Culture     Final   Value:        BLOOD CULTURE RECEIVED NO GROWTH TO DATE CULTURE WILL BE HELD FOR 5 DAYS BEFORE ISSUING A FINAL NEGATIVE REPORT     Performed at [facility]   Report Status PENDING   Incomplete     Studies:              All Imaging reviewed and is as per above notation   Scheduled Meds: . amLODipine  10 mg Oral Daily  . aspirin EC  81 mg Oral Daily  . atorvastatin  20 mg Oral Daily  . cefTRIAXone (ROCEPHIN)  IV  2 g Intravenous Q24H  . fluticasone  2 puff Inhalation BID  . furosemide  20 mg Oral Daily  . insulin aspart  0-9 Units Subcutaneous TID WC  . metoprolol succinate  25 mg Oral Daily   Continuous Infusions:    Assessment/Plan: 1. Likely pyelonephritis-continue empiric ceftriaxone 2 g daily. Await urine culture and blood cult.  CBC plus differential a.m. 2. Dizziness-multifactorial-does not use prescription glasses and has been lung cancer she's used in. Will get orthostatics as well given she is on amlodipine 10 mg and metoprolol 25 daily 3. History of recurrent TIAs with aneurysm-needs interval MRI in December of this year-CT scan performed on this admission was negative however 4. Renal cyst?-Monitor. Consider outpatient urological follow up 5. Diabetes mellitus A1c = 7-blood sugars ranging 104-139-monitor 6. Borderline hypertension-see #2   Code Status: Full Family Communication:  None at bedside Disposition Plan: Inpatient   Pleas Koch, MD  [facility] Pager [phone] 04/12/2013, 1:05 PM    LOS: 1 day

## 2013-04-12 NOTE — Evaluation (Signed)
Occupational Therapy Evaluation Patient Details Name: Donnalee Cellucci MRN: [id number] DOB: Apr 10, 1961 Today's Date: 04/12/2013 Time: 4098-1191 OT Time Calculation (min): 53 min  OT Assessment / Plan / Recommendation History of present illness 52 yo female admitted with acute renal failure, falls. Hx of CVA. 52 year old female with past medical history of CVA, dyslipidemia, diabetes and hypertension who presented to [facility] ED from [facility] with complaints of ongoing abdominal pain, nausea for past few weeks prior to this admission. Patient reported to have intermittent abdominal pain, diffuse in mid abdomen region, occasionally sharp in nature, 6/10 in intensity. Patient had associated nausea but no vomiting. Patient also reported having headaches for past few days. On arrival to emergency room patient was disoriented and while she still does not recall the date she is oriented to self. No complaints of chest pain or shortness of breath. No blood in the stool or urine. No fever or chills. No diarrhea or constipation.   Clinical Impression   Pt presents to OT with decreased I with ADL activity and will benefit from skilled OT to increase I with ADL activity and reuturn to PLOF    OT Assessment  Patient needs continued OT Services    Follow Up Recommendations  CIR       Equipment Recommendations  Hospital bed       Frequency  Min 2X/week    Precautions / Restrictions Precautions Precautions: Fall       ADL  Grooming: Wash/dry face;Set up Where Assessed - Grooming: Supine, head of bed up Where Assessed - Upper Body Bathing: Unsupported sitting Lower Body Bathing: +2 Total assistance Lower Body Bathing: Patient Percentage: 50% Where Assessed - Lower Body Bathing: Supported sit to stand Toilet Transfer: +2 Total assistance Toilet Transfer: Patient Percentage: 10% Toilet Transfer Method: Sit to stand Transfers/Ambulation Related to ADLs: Pt did stand at EOB with 2 assist.  Pt stated  she was dizzy. Pt returned to supine.  Discussed DC plan as pt would need ST SNF. Pt not agreeable- but OT stated she would share with SW.    OT Diagnosis: Generalized weakness  OT Problem List: Decreased strength;Decreased activity tolerance;Decreased safety awareness OT Treatment Interventions: Self-care/ADL training;Patient/family education;DME and/or AE instruction   OT Goals(Current goals can be found in the care plan section) Acute Rehab OT Goals Patient Stated Goal: get well  OT Goal Formulation: With patient Time For Goal Achievement: 04/26/13 Potential to Achieve Goals: Good  Visit Information  Last OT Received On: 04/12/13 Assistance Needed: +2 History of Present Illness: 52 yo female admitted with acute renal failure, falls. Hx of CVA. 52 year old female with past medical history of CVA, dyslipidemia, diabetes and hypertension who presented to [facility] ED from [facility] with complaints of ongoing abdominal pain, nausea for past few weeks prior to this admission. Patient reported to have intermittent abdominal pain, diffuse in mid abdomen region, occasionally sharp in nature, 6/10 in intensity. Patient had associated nausea but no vomiting. Patient also reported having headaches for past few days. On arrival to emergency room patient was disoriented and while she still does not recall the date she is oriented to self. No complaints of chest pain or shortness of breath. No blood in the stool or urine. No fever or chills. No diarrhea or constipation.       Prior Functioning     Home Living Additional Comments: pt states she may have option of going to her daugthers Communication Communication: No difficulties  Vision/Perception Vision - History Patient Visual Report: Blurring of vision Vision - Assessment Additional Comments: pt describes a cloud is over her vision.  Pt feels like L eye is shut- but it is not shut   Cognition  Cognition Arousal/Alertness:  Awake/alert Behavior During Therapy: WFL for tasks assessed/performed Overall Cognitive Status: Within Functional Limits for tasks assessed    Extremity/Trunk Assessment Upper Extremity Assessment Upper Extremity Assessment: RUE deficits/detail RUE Coordination: decreased gross motor Lower Extremity Assessment Lower Extremity Assessment: Defer to PT evaluation     Mobility Bed Mobility Bed Mobility: Supine to Sit Supine to Sit: 3: Mod assist;With rails Transfers Transfers: Sit to Stand;Stand to Sit Sit to Stand: From bed;1: +2 Total assist Sit to Stand: Patient Percentage: 50% Stand to Sit: 1: +2 Total assist;To bed Stand to Sit: Patient Percentage: 50%           End of Session OT - End of Session Activity Tolerance: Patient limited by fatigue Patient left: in bedn with call bell with in reach  GO     Yoland Scherr D 04/12/2013, 10:09 AM

## 2013-04-13 LAB — CBC WITH DIFFERENTIAL/PLATELET
Eosinophils Absolute: 0.2 10*3/uL (ref 0.0–0.7)
Eosinophils Relative: 3 % (ref 0–5)
Hemoglobin: 11 g/dL — ABNORMAL LOW (ref 12.0–15.0)
Lymphocytes Relative: 32 % (ref 12–46)
Lymphs Abs: 2.4 10*3/uL (ref 0.7–4.0)
MCH: 28.5 pg (ref 26.0–34.0)
Monocytes Relative: 10 % (ref 3–12)
Neutro Abs: 4.2 10*3/uL (ref 1.7–7.7)
Neutrophils Relative %: 55 % (ref 43–77)
Platelets: 349 10*3/uL (ref 150–400)
RBC: 3.86 MIL/uL — ABNORMAL LOW (ref 3.87–5.11)
WBC: 7.6 10*3/uL (ref 4.0–10.5)

## 2013-04-13 LAB — GLUCOSE, CAPILLARY: Glucose-Capillary: 111 mg/dL — ABNORMAL HIGH (ref 70–99)

## 2013-04-13 LAB — COMPREHENSIVE METABOLIC PANEL
ALT: 15 U/L (ref 0–35)
Albumin: 3.2 g/dL — ABNORMAL LOW (ref 3.5–5.2)
Alkaline Phosphatase: 74 U/L (ref 39–117)
BUN: 20 mg/dL (ref 6–23)
Calcium: 9.3 mg/dL (ref 8.4–10.5)
Chloride: 107 mEq/L (ref 96–112)
GFR calc Af Amer: 70 mL/min — ABNORMAL LOW (ref >90)
Glucose, Bld: 117 mg/dL — ABNORMAL HIGH (ref 70–99)
Potassium: 4 mEq/L (ref 3.5–5.1)
Sodium: 143 mEq/L (ref 135–145)
Total Bilirubin: 0.2 mg/dL — ABNORMAL LOW (ref 0.3–1.2)
Total Protein: 6.5 g/dL (ref 6.0–8.3)

## 2013-04-13 MED ORDER — ONDANSETRON 4 MG PO TBDP
4.0000 mg | ORAL_TABLET | Freq: Two times a day (BID) | ORAL | Status: DC
  Administered 2013-04-13 – 2013-04-16 (×7): 4 mg via ORAL
  Filled 2013-04-13 (×8): qty 1

## 2013-04-13 MED ORDER — FLUCONAZOLE 100 MG PO TABS
100.0000 mg | ORAL_TABLET | Freq: Every day | ORAL | Status: DC
  Administered 2013-04-13 – 2013-04-16 (×4): 100 mg via ORAL
  Filled 2013-04-13 (×4): qty 1

## 2013-04-13 MED ORDER — CEFUROXIME AXETIL 500 MG PO TABS
500.0000 mg | ORAL_TABLET | Freq: Two times a day (BID) | ORAL | Status: DC
  Administered 2013-04-13 – 2013-04-16 (×6): 500 mg via ORAL
  Filled 2013-04-13 (×8): qty 1

## 2013-04-13 NOTE — Progress Notes (Signed)
Clinical Social Work Department BRIEF PSYCHOSOCIAL ASSESSMENT 04/13/2013  Patient:  Yesenia [name], Yesenia [name]     Account Number:  [id number]     Admit date:  04/11/2013  Clinical Social Worker:  Candie Chroman  Date/Time:  04/13/2013 12:54 PM  Referred by:  CSW  Date Referred:  04/13/2013 Referred for  SNF Placement   Other Referral:   Interview type:  Patient Other interview type:    PSYCHOSOCIAL DATA Living Status:  ALONE Admitted from facility:   Level of care:   Primary support name:  Freeman Caldron Primary support relationship to patient:  CHILD, ADULT Degree of support available:   limited    CURRENT CONCERNS Current Concerns  Post-Acute Placement   Other Concerns:    SOCIAL WORK ASSESSMENT / PLAN Pt is a 52 yr old female living at the [facility] prior to hospitalization. CSW met with pt to assist with d/c planning. ST Rehab will be needed following hospital d/c. CIR has been consulted. Pt is willing to consider ST SNF if CIR is unable to assist. Pt is concerned about finances and states she is unable to use her SSD check to assist with rehab payment. CSW will request additional info regarding pt liability at SNF if SNF is required.   Assessment/plan status:  Psychosocial Support/Ongoing Assessment of Needs Other assessment/ plan:   Information/referral to community resources:   SNF list with bed offers to be provided.    PATIENT'S/FAMILY'S RESPONSE TO PLAN OF CARE: Pt is willing to consider ST SNF placement if no out of pocket costs are required.   Cori Razor LCSW [phone]

## 2013-04-13 NOTE — Progress Notes (Signed)
Patient requested patent iv to be removed "I don't want  It anymore" peripheral iv removed at 2000

## 2013-04-13 NOTE — Progress Notes (Signed)
Yesenia [name] ZOX:[id number] DOB: 02-08-1961 DOA: 04/11/2013 PCP: Egbert Garibaldi, NP  Brief narrative:  52 yr old female, recent admit 01/2013 Pyelo admitted with altered mental status and confusion.  She underwent CT scan of the head as well as abdomen pelvis which was significant for focal area of potential pyelonephritis vs. simple renal cyst Over read of CT scan by urologist Dr. Brunilda Payor confirms above suspicions  Past medical history-As per Problem list Chart reviewed as below- Ed visit 9/2 weakness Stress by Dr. Rennis Golden 02/23/13 low risk Admission 01/23/13 c E.coli UTI/AKI, prior TIA-noted that she was recommended repeat MRI in 6 months from this admission Admission 01/14/13 c TIa  Consultants:  none  Procedures:  Ct abd pelvis  Antibiotics:  Ceftriaxone 10/1   Subjective   Decreased appetite  Claims to have some abdominal pain which is not severe Tolerating some diet Overall looks a little better than yesterday   Objective    Interim History: None  Telemetry: None  Objective: Filed Vitals:   04/13/13 0907 04/13/13 1321 04/13/13 1324 04/13/13 1327  BP:  105/72 114/79 127/79  Pulse:  85 105 113  Temp:  98.5 F (36.9 C)    TempSrc:  Oral    Resp:  16    Height:      Weight:      SpO2: 100% 98%      Intake/Output Summary (Last 24 hours) at 04/13/13 1621 Last data filed at 04/13/13 0934  Gross per 24 hour  Intake    120 ml  Output      0 ml  Net    120 ml    Exam:  General: Alert oriented EOMI NCAT Cardiovascular: S1-S2 no murmur rub or gallop Respiratory: Clinically clear Abdomen: Soft nontender nondistended no rebound or guarding, no CVA tenderness Skin lower extremity is intact with no rash no swelling Neuro moves all 4 limbs equally  Data Reviewed: Basic Metabolic Panel:  Recent Labs Lab 04/11/13 0545 04/11/13 1354 04/12/13 0525 04/13/13 0455  NA 142 145 142 143  K 3.7 3.8 3.5 4.0  CL 108 106 104 107  CO2 26 28 26 27   GLUCOSE 111*  107* 111* 117*  BUN 14 12 16 20   CREATININE 0.91 0.92 1.07 1.05  CALCIUM 9.4 8.8 9.1 9.3  MG  --  1.7  --   --   PHOS  --  4.0  --   --    Liver Function Tests:  Recent Labs Lab 04/11/13 0545 04/11/13 1354 04/12/13 0525 04/13/13 0455  AST 18 16 16 14   ALT 20 18 17 15   ALKPHOS 79 77 74 74  BILITOT 0.1* 0.2* 0.2* 0.2*  PROT 6.8 7.2 6.8 6.5  ALBUMIN 3.3* 3.5 3.3* 3.2*    Recent Labs Lab 04/11/13 0545  LIPASE 47   No results found for this basename: AMMONIA,  in the last 168 hours CBC:  Recent Labs Lab 04/11/13 0545 04/11/13 1354 04/12/13 0525 04/13/13 0455  WBC 8.1 8.0 7.3 7.6  NEUTROABS  --  4.6  --  4.2  HGB 11.2* 11.5* 11.5* 11.0*  HCT 34.6* 35.1* 35.3* 33.6*  MCV 86.5 86.2 86.3 87.0  PLT 370 375 370 349   Cardiac Enzymes: No results found for this basename: CKTOTAL, CKMB, CKMBINDEX, TROPONINI,  in the last 168 hours BNP: No components found with this basename: POCBNP,  CBG:  Recent Labs Lab 04/12/13 0719 04/12/13 1102 04/12/13 1636 04/13/13 0755 04/13/13 1200  GLUCAP 109* 134* 107* 111*  92    Recent Results (from the past 240 hour(s))  URINE CULTURE     Status: None   Collection Time    04/11/13  7:05 AM      Result Value Range Status   Specimen Description URINE, CATHETERIZED   Final   Special Requests NONE   Final   Culture  Setup Time     Final   Value: 04/11/2013 14:09     Performed at [facility] Count     Final   Value: NO GROWTH     Performed at [facility]   Culture     Final   Value: NO GROWTH     Performed at [facility]   Report Status 04/12/2013 FINAL   Final  CULTURE, BLOOD (ROUTINE X 2)     Status: None   Collection Time    04/11/13 10:00 AM      Result Value Range Status   Specimen Description BLOOD LEFT ANTECUBITAL   Final   Special Requests BOTTLES DRAWN AEROBIC AND ANAEROBIC   Final   Culture  Setup Time     Final   Value: 04/11/2013 14:05     Performed at [facility]   Culture     Final   Value:        BLOOD CULTURE RECEIVED NO GROWTH TO DATE CULTURE WILL BE HELD FOR 5 DAYS BEFORE ISSUING A FINAL NEGATIVE REPORT     Performed at [facility]   Report Status PENDING   Incomplete  CULTURE, BLOOD (ROUTINE X 2)     Status: None   Collection Time    04/11/13 10:05 AM      Result Value Range Status   Specimen Description BLOOD RIGHT ANTECUBITAL   Final   Special Requests BOTTLES DRAWN AEROBIC AND ANAEROBIC   Final   Culture  Setup Time     Final   Value: 04/11/2013 14:05     Performed at [facility]   Culture     Final   Value:        BLOOD CULTURE RECEIVED NO GROWTH TO DATE CULTURE WILL BE HELD FOR 5 DAYS BEFORE ISSUING A FINAL NEGATIVE REPORT     Performed at [facility]   Report Status PENDING   Incomplete     Studies:              All Imaging reviewed and is as per above notation   Scheduled Meds: . amLODipine  10 mg Oral Daily  . aspirin EC  81 mg Oral Daily  . atorvastatin  20 mg Oral Daily  . fluconazole  100 mg Oral Daily  . fluticasone  2 puff Inhalation BID  . furosemide  20 mg Oral Daily  . insulin aspart  0-9 Units Subcutaneous TID WC  . metoprolol succinate  12.5 mg Oral Daily  . ondansetron  4 mg Oral Q12H   Continuous Infusions:    Assessment/Plan: 1. Likely pyelonephritis-continue empiric ceftriaxone 2 g daily-transitioned in view of urine culture 2 Ceftin 500 twice a day for total of 7 days 2. Dizziness-multifactorial-does not use prescription glasses,  orthostatics were positive. Discontinued metoprolol 12.5 daily 3. History of recurrent TIAs with aneurysm-needs interval MRI in December of this year-CT scan performed on this admission was negative however 4. Renal cyst?-Monitor. See above discussion 5. Diabetes mellitus A1c = 7-blood sugars ranging 104-139-monitor 6. Borderline hypertension-see #2 7. Intertriginous  yeast infection-Rx fluconazole   Code Status: Full Family  Communication:  Did discuss plan of care with daughter 04/12/13 Disposition Plan: Inpatient   Pleas Koch, MD  [facility] Pager [phone] 04/13/2013, 4:21 PM    LOS: 2 days

## 2013-04-14 DIAGNOSIS — R51 Headache: Secondary | ICD-10-CM

## 2013-04-14 DIAGNOSIS — R519 Headache, unspecified: Secondary | ICD-10-CM

## 2013-04-14 LAB — GLUCOSE, CAPILLARY
Glucose-Capillary: 105 mg/dL — ABNORMAL HIGH (ref 70–99)
Glucose-Capillary: 102 mg/dL — ABNORMAL HIGH (ref 70–99)

## 2013-04-14 MED ORDER — PROCHLORPERAZINE EDISYLATE 5 MG/ML IJ SOLN
10.0000 mg | Freq: Once | INTRAMUSCULAR | Status: AC
  Administered 2013-04-14: 10 mg via INTRAVENOUS
  Filled 2013-04-14: qty 2

## 2013-04-14 MED ORDER — TRAMADOL HCL 50 MG PO TABS
50.0000 mg | ORAL_TABLET | Freq: Once | ORAL | Status: AC
  Administered 2013-04-14: 50 mg via ORAL
  Filled 2013-04-14: qty 1

## 2013-04-14 MED ORDER — TRAMADOL HCL 50 MG PO TABS
50.0000 mg | ORAL_TABLET | Freq: Four times a day (QID) | ORAL | Status: DC | PRN
  Administered 2013-04-15: 50 mg via ORAL
  Filled 2013-04-14: qty 1

## 2013-04-14 MED ORDER — DIPHENHYDRAMINE HCL 50 MG/ML IJ SOLN
25.0000 mg | Freq: Once | INTRAMUSCULAR | Status: AC
  Administered 2013-04-14: 25 mg via INTRAVENOUS
  Filled 2013-04-14: qty 1

## 2013-04-14 NOTE — Consult Note (Addendum)
Reason for Consult: Headaches Referring Physician: Dr Mahala Menghini  CC: Constant headaches since stroke in July of this year  HPI: Yesenia [name] is an 52 y.o. female admitted to [facility] 10 07/2012 for evaluation of headaches and mental status changes. The patient was found to have acute pyelonephritis. She has continued to have headaches which she states started right after her stroke in July. She describes the headaches as being located over her left eye radiating across her for head. She rates the pain as a 9 on 1-10 scale. There are no aggravating or relieving factors. She states that the pain is continuous and never lets up. "24/7". She has taken Tylenol at home without relief. She's not found anything that will relieve her symptoms. She does note occasional flashes of light in both eyes but other than that there no associated symptoms. She states she never had headaches until she had her stroke. She also reports memory problems since her stroke in July. She appears depressed. 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  Past Medical History  Diagnosis Date  . Hypertension   . Stroke   . Diabetes mellitus without complication   . Anemia     Past Surgical History  Procedure Laterality Date  . Leg surgery Right 2007    Surgery x3    Family History  Problem Relation Age of Onset  . Leukemia Brother     Social History:  reports that she has never smoked. She has never used smokeless tobacco. She reports that she does not drink alcohol or use illicit drugs.  Allergies  Allergen Reactions  . Morphine And Related Hives    Medications:  Scheduled: . amLODipine  10 mg Oral Daily  . aspirin EC  81 mg Oral Daily  . atorvastatin  20 mg Oral Daily  . cefUROXime  500 mg Oral BID WC  . diphenhydrAMINE  25 mg Intravenous Once  . fluconazole  100 mg Oral Daily  . fluticasone  2 puff Inhalation BID  . furosemide  20 mg Oral Daily  . insulin aspart  0-9 Units  Subcutaneous TID WC  . ondansetron  4 mg Oral Q12H  . prochlorperazine  10 mg Intravenous Once   History obtained from the patient  General ROS: negative for -  fatigue, fever, night sweats, weight gain or weight loss. Positive for recent chills and decreased appetite. Psychological ROS: negative for - behavioral disorder, hallucinations, memory difficulties, mood swings or suicidal ideation Ophthalmic ROS: negative for - blurry vision, double vision, eye pain or loss of vision. Intermittent flashes of light in both eyes. ENT ROS: negative for - epistaxis, nasal discharge, oral lesions, sore throat, tinnitus or vertigo Allergy and Immunology ROS: negative for - hives or itchy/watery eyes Hematological and Lymphatic ROS: negative for - bleeding problems, bruising or swollen lymph nodes Endocrine ROS: negative for - galactorrhea, hair pattern changes, polydipsia/polyuria or temperature intolerance Respiratory ROS: negative for - cough, hemoptysis, shortness of breath or wheezing. Dyspnea on exertion. Cardiovascular ROS: negative for - chest pain, dyspnea on exertion, edema or irregular heartbeat. Recent chest pain Gastrointestinal ROS: negative for - abdominal pain, diarrhea, hematemesis, nausea/vomiting or stool incontinence. Epigastric pain. Genito-Urinary ROS: negative for - dysuria, hematuria, incontinence or urinary frequency/urgency Musculoskeletal ROS: negative for - joint swelling or muscular weakness. Diffuse muscle aches. Neurological ROS: as noted in HPI Dermatological ROS: negative for rash and skin lesion changes   Physical Examination: Blood pressure 110/70, pulse 108, temperature  98.2 F (36.8 C), temperature source Oral, resp. rate 20, height 5\' 5"  (1.651 m), weight 146.013 kg (321 lb 14.4 oz), SpO2 93.00%.  General - 52 year-old female who frequently moans as if in pain. Heart - Regular rate and rhythm - distant heart sounds Lungs - Clear to auscultation Abdomen - Soft -  epigastric tenderness Extremities - Distal pulses intact - 1+ edema bilaterally Skin - Warm and dry   Neurologic Examination Mental Status: Alert, the patient was unable to tell me the date. She could not remember the name of the hospital. Thought content appropriate.  Speech fluent without evidence of aphasia.  Able to follow 3 step commands without difficulty. Cranial Nerves: II: Discs dificult to  visualize; Visual fields grossly normal, pupils equal, round, reactive to light and accommodation III,IV, VI: ptosis not present, extra-ocular motions intact bilaterally V,VII: smile symmetric, facial light touch sensation normal bilaterally VIII: hearing normal bilaterally IX,X: gag reflex present XI: bilateral shoulder shrug XII: midline tongue extension Motor: Motor strength 3/5 throughout although questionable effort by the patient and when encouraged, does give closer to full strength.  Tone and bulk:normal tone throughout; no atrophy noted Sensory: light touch grossly intact throughout, bilaterally, temp slightly decreased on right.  Deep Tendon Reflexes: 2+ and symmetric throughout Plantars: Right: downgoing   Left: downgoing Cerebellar: normal finger-to-nose  Gait: Observed patient ambulating with a walker and nurses aide slowly and with much difficulty. CV: pulses palpable throughout   Laboratory Studies:   Basic Metabolic Panel:  Recent Labs Lab 04/11/13 0545 04/11/13 1354 04/12/13 0525 04/13/13 0455  NA 142 145 142 143  K 3.7 3.8 3.5 4.0  CL 108 106 104 107  CO2 26 28 26 27   GLUCOSE 111* 107* 111* 117*  BUN 14 12 16 20   CREATININE 0.91 0.92 1.07 1.05  CALCIUM 9.4 8.8 9.1 9.3  MG  --  1.7  --   --   PHOS  --  4.0  --   --     Liver Function Tests:  Recent Labs Lab 04/11/13 0545 04/11/13 1354 04/12/13 0525 04/13/13 0455  AST 18 16 16 14   ALT 20 18 17 15   ALKPHOS 79 77 74 74  BILITOT 0.1* 0.2* 0.2* 0.2*  PROT 6.8 7.2 6.8 6.5  ALBUMIN 3.3* 3.5 3.3*  3.2*    Recent Labs Lab 04/11/13 0545  LIPASE 47   No results found for this basename: AMMONIA,  in the last 168 hours  CBC:  Recent Labs Lab 04/11/13 0545 04/11/13 1354 04/12/13 0525 04/13/13 0455  WBC 8.1 8.0 7.3 7.6  NEUTROABS  --  4.6  --  4.2  HGB 11.2* 11.5* 11.5* 11.0*  HCT 34.6* 35.1* 35.3* 33.6*  MCV 86.5 86.2 86.3 87.0  PLT 370 375 370 349    Cardiac Enzymes: No results found for this basename: CKTOTAL, CKMB, CKMBINDEX, TROPONINI,  in the last 168 hours  BNP: No components found with this basename: POCBNP,   CBG:  Recent Labs Lab 04/13/13 1200 04/13/13 1648 04/13/13 2134 04/14/13 0723 04/14/13 1139  GLUCAP 92 123* 100* 111* 105*    Microbiology: Results for orders placed during the hospital encounter of 04/11/13  URINE CULTURE     Status: None   Collection Time    04/11/13  7:05 AM      Result Value Range Status   Specimen Description URINE, CATHETERIZED   Final   Special Requests NONE   Final   Culture  Setup Time  Final   Value: 04/11/2013 14:09     Performed at [facility]   [place] Count     Final   Value: NO GROWTH     Performed at [facility]   Culture     Final   Value: NO GROWTH     Performed at [facility]   Report Status 04/12/2013 FINAL   Final  CULTURE, BLOOD (ROUTINE X 2)     Status: None   Collection Time    04/11/13 10:00 AM      Result Value Range Status   Specimen Description BLOOD LEFT ANTECUBITAL   Final   Special Requests BOTTLES DRAWN AEROBIC AND ANAEROBIC   Final   Culture  Setup Time     Final   Value: 04/11/2013 14:05     Performed at [facility]   Culture     Final   Value:        BLOOD CULTURE RECEIVED NO GROWTH TO DATE CULTURE WILL BE HELD FOR 5 DAYS BEFORE ISSUING A FINAL NEGATIVE REPORT     Performed at [facility]   Report Status PENDING   Incomplete  CULTURE, BLOOD (ROUTINE X 2)     Status: None   Collection Time    04/11/13 10:05 AM      Result  Value Range Status   Specimen Description BLOOD RIGHT ANTECUBITAL   Final   Special Requests BOTTLES DRAWN AEROBIC AND ANAEROBIC   Final   Culture  Setup Time     Final   Value: 04/11/2013 14:05     Performed at [facility]   Culture     Final   Value:        BLOOD CULTURE RECEIVED NO GROWTH TO DATE CULTURE WILL BE HELD FOR 5 DAYS BEFORE ISSUING A FINAL NEGATIVE REPORT     Performed at [facility]   Report Status PENDING   Incomplete    Coagulation Studies: No results found for this basename: LABPROT, INR,  in the last 72 hours  Urinalysis:  Recent Labs Lab 04/11/13 0705  COLORURINE YELLOW  LABSPEC 1.029  PHURINE 5.0  GLUCOSEU NEGATIVE  HGBUR MODERATE*  BILIRUBINUR NEGATIVE  KETONESUR NEGATIVE  PROTEINUR NEGATIVE  UROBILINOGEN 0.2  NITRITE NEGATIVE  LEUKOCYTESUR NEGATIVE    Lipid Panel:     Component Value Date/Time   CHOL 224* 01/14/2013 0715   TRIG 133 01/14/2013 0715   HDL 56 01/14/2013 0715   CHOLHDL 4.0 01/14/2013 0715   VLDL 27 01/14/2013 0715   LDLCALC 141* 01/14/2013 0715    HgbA1C:  Lab Results  Component Value Date   HGBA1C 7.0* 04/11/2013    Urine Drug Screen:     Component Value Date/Time   LABOPIA NONE DETECTED 04/11/2013 1437   COCAINSCRNUR NONE DETECTED 04/11/2013 1437   LABBENZ POSITIVE* 04/11/2013 1437   AMPHETMU NONE DETECTED 04/11/2013 1437   THCU NONE DETECTED 04/11/2013 1437   LABBARB NONE DETECTED 04/11/2013 1437    Alcohol Level:  Recent Labs Lab 04/11/13 1354  ETH <11    Other results: EKG:     MRA of the head 01/14/2013  Mild intracranial atherosclerotic changes as described. No  proximal flow reducing lesion.  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 Consider 6-  month MRA follow-up.  MRI of the brain  01/14/2013  Premature white matter changes, consistent with chronic  microvascular ischemic change related hypertension. No acute  intracranial findings.  No acute stroke or  bleed.    Assessment/Plan:  This is an unfortunate 52 year old female admitted for treatment of pyelonephritis associated with altered mental status and headaches. The headaches have been present since July of this year when she was admitted with right hemiparesis. The headaches have been quite severe, located over her left eye, and associated with flashes of light in her visual fields. She also reports having memory problems since July. These headaches might represent new onset of migraine headaches. 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 Amada Jupiter the patient might be a candidate for Topamax therapy. We will give a trial dose of Compazine and Benadryl intravenously now in an attempt to provide some relief.  Further impression and plan per Dr. Amada Jupiter.   Delton See PA-C [facility] Pager [phone] 04/14/2013, 5:02 PM  I have seen and evaluated the patient. I have reviewed the above note and made appropriate changes. 52 year old female with headaches that sound migrainous in nature. I do not think that the incidentally found aneurysm has any bearing on her current headaches. This can be followed with serial imaging.   It is not clear to me that she had a stroke in July, and given that she had a negative MRI, and psychogenic presentation was considered at that time. For acute treatment of her headaches, would consider Compazine plus Benadryl. Could repeat this as needed. Also could consider Toradol 30 mg x1, Depakote 500 mg IV x1, magnesium 2 g IV x1 as alternatives.  For prophylaxis, I feel that she may benefit from Topamax, but this needs to be titrated up. We may not make her headache free during this hospitalization, and I would recommend she arrange followup with either [place] or [place] neurology for further management.   Topamax titration: Topamax 25 mg daily x1 week then Topamax 50 mg daily x1 week then Topamax  75 mg daily x1 week then Topamax 100 mg daily from thereon  Ritta Slot, MD [facility] [phone]  If 7pm- 7am, please page neurology on call at [phone].

## 2013-04-14 NOTE — Progress Notes (Signed)
Physical Therapy Treatment Patient Details Name: Yesenia [name] MRN: [id number] DOB: 1961/05/08 Today's Date: 04/14/2013 Time: 4098-1191 PT Time Calculation (min): 19 min  PT Assessment / Plan / Recommendation  History of Present Illness     PT Comments   Pt continues  With HA and c/o pain and bruising behind R knee and calf. Noted bruise present on R leg.  RN reports that pt did ambulate to the bathroom. Attempted ambulation in room, pt started leaning over RW. C/o HA. Pt sat down then assisted back to bed.  Follow Up Recommendations  CIR;SNF;Supervision/Assistance - 24 hour     Does the patient have the potential to tolerate intense rehabilitation     Barriers to Discharge        Equipment Recommendations  None recommended by PT (if pt continues with falls, ? benefit from [facility])    Recommendations for Other Services    Frequency Min 3X/week   Progress towards PT Goals Progress towards PT goals: Progressing toward goals  Plan Current plan remains appropriate    Precautions / Restrictions Precautions Precautions: Fall   Pertinent Vitals/Pain HA is 9, medicated prior to tx.    Mobility  Bed Mobility Bed Mobility: Sit to Supine Supine to Sit: 5: Supervision;With rails Sit to Supine: 4: Min assist Details for Bed Mobility Assistance: extra time  for moving to edge. Pt c/o HA Transfers Transfers: Sit to Stand;Stand to Sit Sit to Stand: From bed;From chair/3-in-1;4: Min assist;From elevated surface;With upper extremity assist Stand to Sit: To chair/3-in-1;To bed;4: Min guard Details for Transfer Assistance: extra time to get up to standing. Pt leaned forward very far in order to sit down. Ambulation/Gait Ambulation/Gait Assistance: 3: Mod assist Ambulation Distance (Feet): 10 Feet Assistive device: Rolling walker Ambulation/Gait Assistance Details: pt reports back of R leg is painful, HA limiting. Gait Pattern: Trunk flexed;Step-to pattern;Decreased step length -  right;Decreased step length - left Gait velocity: very slow and halting while walking, leans over front of walker at times.    Exercises     PT Diagnosis:    PT Problem List:   PT Treatment Interventions:     PT Goals (current goals can now be found in the care plan section)    Visit Information  Last PT Received On: 04/14/13 Assistance Needed: +1 (+2 to folllow with chair for safety)    Subjective Data      Cognition  Cognition Arousal/Alertness: Awake/alert Behavior During Therapy: [facility] for tasks assessed/performed;Flat affect    Balance     End of Session PT - End of Session Patient left: with call bell/phone within reach;in bed   GP     Rada Hay 04/14/2013, 12:47 PM Blanchard Kelch PT [phone]

## 2013-04-14 NOTE — Progress Notes (Signed)
Yesenia [name] ZOX:[id number] DOB: 09/02/1960 DOA: 04/11/2013 PCP: Egbert Garibaldi, NP  Brief narrative:  52 yr old female, recent admit 01/2013 Pyelo admitted with altered mental status and confusion.  She underwent CT scan of the head as well as abdomen pelvis which was significant for focal area of potential pyelonephritis vs. simple renal cyst Over read of CT scan by urologist Dr. Brunilda Payor confirms above suspicions for potential focal  Past medical history-As per Problem list Chart reviewed as below- Ed visit 9/2 weakness Stress by Dr. Rennis Golden 02/23/13 low risk Admission 01/23/13 c E.coli UTI/AKI, prior TIA-noted that she was recommended repeat MRI in 6 months from this admission Admission 01/14/13 c TIa  Consultants:  Dr. Brunilda Payor reviewed CT images on 04/14/13  Procedures:  Ct abd pelvis  Antibiotics:  Ceftriaxone 10/1-10-2  Ceftin 10/3  Fluconazole 10/3  Urine culture no growth   Subjective   Decreased appetite  Seems to be doing better Ambulant complains of headache which is persisting Otherwise no focal deficits    Objective    Interim History: None  Telemetry: None  Objective: Filed Vitals:   04/14/13 0716 04/14/13 0717 04/14/13 0922 04/14/13 0932  BP: 123/85 140/92 139/90   Pulse:      Temp:      TempSrc:      Resp:      Height:      Weight:      SpO2:    92%    Intake/Output Summary (Last 24 hours) at 04/14/13 1500 Last data filed at 04/14/13 0800  Gross per 24 hour  Intake    220 ml  Output      0 ml  Net    220 ml    Exam:  General: Alert oriented EOMI NCAT Cardiovascular: S1-S2 no murmur rub or gallop Respiratory: Clinically clear Abdomen: Soft nontender nondistended no rebound or guarding, no CVA tenderness Skin lower extremity is intact with no rash no swelling-MASD underneath breasts bilaterally Neuro moves all 4 limbs equally  Data Reviewed: Basic Metabolic Panel:  Recent Labs Lab 04/11/13 0545 04/11/13 1354 04/12/13 0525  04/13/13 0455  NA 142 145 142 143  K 3.7 3.8 3.5 4.0  CL 108 106 104 107  CO2 26 28 26 27   GLUCOSE 111* 107* 111* 117*  BUN 14 12 16 20   CREATININE 0.91 0.92 1.07 1.05  CALCIUM 9.4 8.8 9.1 9.3  MG  --  1.7  --   --   PHOS  --  4.0  --   --    Liver Function Tests:  Recent Labs Lab 04/11/13 0545 04/11/13 1354 04/12/13 0525 04/13/13 0455  AST 18 16 16 14   ALT 20 18 17 15   ALKPHOS 79 77 74 74  BILITOT 0.1* 0.2* 0.2* 0.2*  PROT 6.8 7.2 6.8 6.5  ALBUMIN 3.3* 3.5 3.3* 3.2*    Recent Labs Lab 04/11/13 0545  LIPASE 47   No results found for this basename: AMMONIA,  in the last 168 hours CBC:  Recent Labs Lab 04/11/13 0545 04/11/13 1354 04/12/13 0525 04/13/13 0455  WBC 8.1 8.0 7.3 7.6  NEUTROABS  --  4.6  --  4.2  HGB 11.2* 11.5* 11.5* 11.0*  HCT 34.6* 35.1* 35.3* 33.6*  MCV 86.5 86.2 86.3 87.0  PLT 370 375 370 349   Cardiac Enzymes: No results found for this basename: CKTOTAL, CKMB, CKMBINDEX, TROPONINI,  in the last 168 hours BNP: No components found with this basename: POCBNP,  CBG:  Recent Labs  Lab 04/13/13 1200 04/13/13 1648 04/13/13 2134 04/14/13 0723 04/14/13 1139  GLUCAP 92 123* 100* 111* 105*    Recent Results (from the past 240 hour(s))  URINE CULTURE     Status: None   Collection Time    04/11/13  7:05 AM      Result Value Range Status   Specimen Description URINE, CATHETERIZED   Final   Special Requests NONE   Final   Culture  Setup Time     Final   Value: 04/11/2013 14:09     Performed at [facility] Count     Final   Value: NO GROWTH     Performed at [facility]   Culture     Final   Value: NO GROWTH     Performed at [facility]   Report Status 04/12/2013 FINAL   Final  CULTURE, BLOOD (ROUTINE X 2)     Status: None   Collection Time    04/11/13 10:00 AM      Result Value Range Status   Specimen Description BLOOD LEFT ANTECUBITAL   Final   Special Requests BOTTLES DRAWN AEROBIC AND  ANAEROBIC   Final   Culture  Setup Time     Final   Value: 04/11/2013 14:05     Performed at [facility]   Culture     Final   Value:        BLOOD CULTURE RECEIVED NO GROWTH TO DATE CULTURE WILL BE HELD FOR 5 DAYS BEFORE ISSUING A FINAL NEGATIVE REPORT     Performed at [facility]   Report Status PENDING   Incomplete  CULTURE, BLOOD (ROUTINE X 2)     Status: None   Collection Time    04/11/13 10:05 AM      Result Value Range Status   Specimen Description BLOOD RIGHT ANTECUBITAL   Final   Special Requests BOTTLES DRAWN AEROBIC AND ANAEROBIC   Final   Culture  Setup Time     Final   Value: 04/11/2013 14:05     Performed at [facility]   Culture     Final   Value:        BLOOD CULTURE RECEIVED NO GROWTH TO DATE CULTURE WILL BE HELD FOR 5 DAYS BEFORE ISSUING A FINAL NEGATIVE REPORT     Performed at [facility]   Report Status PENDING   Incomplete     Studies:              All Imaging reviewed and is as per above notation   Scheduled Meds: . amLODipine  10 mg Oral Daily  . aspirin EC  81 mg Oral Daily  . atorvastatin  20 mg Oral Daily  . cefUROXime  500 mg Oral BID WC  . fluconazole  100 mg Oral Daily  . fluticasone  2 puff Inhalation BID  . furosemide  20 mg Oral Daily  . insulin aspart  0-9 Units Subcutaneous TID WC  . ondansetron  4 mg Oral Q12H   Continuous Infusions:    Assessment/Plan: 1. Likely pyelonephritis-continue empiric ceftriaxone 2 g daily-transitioned in view of urine culture 2 Ceftin 500 twice a day for total of 10-14 days 2. Dizziness-multifactorial-does not use prescription glasses,  orthostatics were positive.  Discontinued metoprolol 12.5 daily-states a little better with ambulation 3. History of recurrent TIAs with co-incidental aneurysm-needs interval MRI in December of this year-CT scan performed on this admission  was negative-Appreciate Neuro consult in advance 04/14/13 4. Renal cyst?-Monitor. See above  discussion 5. Diabetes mellitus A1c = 7-blood sugars ranging 104-139-monitor 6. Borderline hypertension-see #2 7. Intertriginous yeast infection-Rx fluconazole   Code Status: Full Family Communication:  Did discuss plan of care with daughter 04/14/13 [phone] Disposition Plan: Inpatient   Pleas Koch, MD  [facility] Pager [phone] 04/14/2013, 3:00 PM    LOS: 3 days

## 2013-04-15 LAB — GLUCOSE, CAPILLARY
Glucose-Capillary: 112 mg/dL — ABNORMAL HIGH (ref 70–99)
Glucose-Capillary: 89 mg/dL (ref 70–99)

## 2013-04-15 MED ORDER — TOPIRAMATE 25 MG PO TABS
25.0000 mg | ORAL_TABLET | Freq: Every day | ORAL | Status: DC
  Administered 2013-04-15 – 2013-04-16 (×2): 25 mg via ORAL
  Filled 2013-04-15 (×2): qty 1

## 2013-04-15 NOTE — Progress Notes (Signed)
Yesenia [name] MVH:[id number] DOB: March 28, 1961 DOA: 04/11/2013 PCP: Egbert Garibaldi, NP  Brief narrative:  52 yr old female, recent admit 01/2013 Pyelo admitted with altered mental status and confusion.  She underwent CT scan of the head as well as abdomen pelvis which was significant for focal area of potential pyelonephritis vs. simple renal cyst Over read of CT scan by urologist Dr. Brunilda Payor confirms above suspicions for potential Pyelonephritis  Past medical history-As per Problem list Chart reviewed as below- Ed visit 9/2 weakness Stress by Dr. Rennis Golden 02/23/13 low risk Admission 01/23/13 c E.coli UTI/AKI, prior TIA-noted that she was recommended repeat MRI in 6 months from this admission Admission 01/14/13 c TIa  Consultants:  Dr. Brunilda Payor reviewed CT images on 04/14/13  Neurology consult 04/15/13  Procedures:  Ct abd pelvis  Antibiotics:  Ceftriaxone 10/1-10-2  Ceftin 10/3  Fluconazole 10/3  Urine culture no growth   Subjective   Back at baseline. No headaches at all right now.   Objective    Interim History: None  Telemetry: None  Objective: Filed Vitals:   04/15/13 0646 04/15/13 0819 04/15/13 0921 04/15/13 1528  BP: 126/81  130/79 102/68  Pulse: 86   91  Temp:    97.6 F (36.4 C)  TempSrc:    Oral  Resp:    20  Height:      Weight: 144.8 kg (319 lb 3.6 oz)     SpO2: 91% 96%  96%    Intake/Output Summary (Last 24 hours) at 04/15/13 1653 Last data filed at 04/14/13 1810  Gross per 24 hour  Intake    360 ml  Output      0 ml  Net    360 ml    Exam:  General: Alert oriented EOMI NCAT Cardiovascular: S1-S2 no murmur rub or gallop Respiratory: Clinically clear Abdomen: Soft nontender nondistended no rebound or guarding, no CVA tenderness Skin lower extremity is intact with no rash no swelling-MASD underneath breasts bilaterally Neuro moves all 4 limbs equally  Data Reviewed: Basic Metabolic Panel:  Recent Labs Lab 04/11/13 0545 04/11/13 1354  04/12/13 0525 04/13/13 0455  NA 142 145 142 143  K 3.7 3.8 3.5 4.0  CL 108 106 104 107  CO2 26 28 26 27   GLUCOSE 111* 107* 111* 117*  BUN 14 12 16 20   CREATININE 0.91 0.92 1.07 1.05  CALCIUM 9.4 8.8 9.1 9.3  MG  --  1.7  --   --   PHOS  --  4.0  --   --    Liver Function Tests:  Recent Labs Lab 04/11/13 0545 04/11/13 1354 04/12/13 0525 04/13/13 0455  AST 18 16 16 14   ALT 20 18 17 15   ALKPHOS 79 77 74 74  BILITOT 0.1* 0.2* 0.2* 0.2*  PROT 6.8 7.2 6.8 6.5  ALBUMIN 3.3* 3.5 3.3* 3.2*    Recent Labs Lab 04/11/13 0545  LIPASE 47   No results found for this basename: AMMONIA,  in the last 168 hours CBC:  Recent Labs Lab 04/11/13 0545 04/11/13 1354 04/12/13 0525 04/13/13 0455  WBC 8.1 8.0 7.3 7.6  NEUTROABS  --  4.6  --  4.2  HGB 11.2* 11.5* 11.5* 11.0*  HCT 34.6* 35.1* 35.3* 33.6*  MCV 86.5 86.2 86.3 87.0  PLT 370 375 370 349   Cardiac Enzymes: No results found for this basename: CKTOTAL, CKMB, CKMBINDEX, TROPONINI,  in the last 168 hours BNP: No components found with this basename: POCBNP,  CBG:  Recent  Labs Lab 04/14/13 1139 04/14/13 1715 04/14/13 2124 04/15/13 0729 04/15/13 1110  GLUCAP 105* 126* 102* 112* 89    Recent Results (from the past 240 hour(s))  URINE CULTURE     Status: None   Collection Time    04/11/13  7:05 AM      Result Value Range Status   Specimen Description URINE, CATHETERIZED   Final   Special Requests NONE   Final   Culture  Setup Time     Final   Value: 04/11/2013 14:09     Performed at [facility] Count     Final   Value: NO GROWTH     Performed at [facility]   Culture     Final   Value: NO GROWTH     Performed at [facility]   Report Status 04/12/2013 FINAL   Final  CULTURE, BLOOD (ROUTINE X 2)     Status: None   Collection Time    04/11/13 10:00 AM      Result Value Range Status   Specimen Description BLOOD LEFT ANTECUBITAL   Final   Special Requests BOTTLES DRAWN  AEROBIC AND ANAEROBIC   Final   Culture  Setup Time     Final   Value: 04/11/2013 14:05     Performed at [facility]   Culture     Final   Value:        BLOOD CULTURE RECEIVED NO GROWTH TO DATE CULTURE WILL BE HELD FOR 5 DAYS BEFORE ISSUING A FINAL NEGATIVE REPORT     Performed at [facility]   Report Status PENDING   Incomplete  CULTURE, BLOOD (ROUTINE X 2)     Status: None   Collection Time    04/11/13 10:05 AM      Result Value Range Status   Specimen Description BLOOD RIGHT ANTECUBITAL   Final   Special Requests BOTTLES DRAWN AEROBIC AND ANAEROBIC   Final   Culture  Setup Time     Final   Value: 04/11/2013 14:05     Performed at [facility]   Culture     Final   Value:        BLOOD CULTURE RECEIVED NO GROWTH TO DATE CULTURE WILL BE HELD FOR 5 DAYS BEFORE ISSUING A FINAL NEGATIVE REPORT     Performed at [facility]   Report Status PENDING   Incomplete     Studies:              All Imaging reviewed and is as per above notation   Scheduled Meds: . amLODipine  10 mg Oral Daily  . aspirin EC  81 mg Oral Daily  . atorvastatin  20 mg Oral Daily  . cefUROXime  500 mg Oral BID WC  . fluconazole  100 mg Oral Daily  . fluticasone  2 puff Inhalation BID  . furosemide  20 mg Oral Daily  . ondansetron  4 mg Oral Q12H   Continuous Infusions:    Assessment/Plan: 1. Likely pyelonephritis-continue empiric ceftriaxone 2 g daily-transitioned in view of urine culture 2 Ceftin 500 twice a day for total of 10-14 days 2. Likely migraine headaches-appreciate greatly neurology input-we'll give Compazine plus Benadryl again IV if needed. Started Topamax 25 mg daily and will follow the schedule as outlined by Amada Jupiter on 04/15/2013. 3. Dizziness-multifactorial-does not use prescription glasses,  orthostatics were positive.  Discontinued metoprolol 12.5 daily-states a little better  with ambulation 4. History of recurrent TIAs with co-incidental  aneurysm-needs interval MRI in December of this year-CT scan performed on this admission was negative. 5. Renal cyst?-Monitor. See above discussion 6. Diabetes mellitus A1c = 7-blood sugars ranging 104-139-monitor 7. Borderline hypertension-see #2 8. Intertriginous yeast infection-Rx fluconazole   Code Status: Full Family Communication:  Did discuss plan of care with daughter 04/14/13 [phone] Disposition Plan: Inpatient   Pleas Koch, MD  [facility] Pager [phone] 04/15/2013, 4:53 PM    LOS: 4 days

## 2013-04-15 NOTE — Progress Notes (Signed)
Subjective: Reports headache responded well to Compazine  Exam: Filed Vitals:   04/15/13 1528  BP: 102/68  Pulse: 91  Temp: 97.6 F (36.4 C)  Resp: 20   Gen: In bed, NAD MS: Awake, alert, oriented  CN: Pupils equal round and reactive, visual fields full Motor: Gives poor effort, but appears to have at least decent strength bilaterally Sensory: Reports decreased on the right  Impression: 52 year old female with headaches that sound migrainous in nature. I do not think that the incidentally found aneurysm has any bearing on her current headaches. This can be followed with serial imaging.  It is not clear to me that she had a stroke in July, and given that she had a negative MRI, and psychogenic presentation was considered at that time.   For prophylaxis, I feel that she may benefit from Topamax, but this needs to be titrated up. I would recommend she arrange followup with either [place] or [place] neurology for further management.    Neurology will sign off at this time. Please call with any further questions  Topamax titration:  Topamax 25 mg daily x1 week then  Topamax 50 mg daily x1 week then  Topamax 75 mg daily x1 week then  Topamax 100 mg daily from thereon    could repeat Compazine 10 mg plus Benadryl 25 mg for current headache.  Ritta Slot, MD [facility] [phone]  If 7pm- 7am, please page neurology on call at [phone].

## 2013-04-15 NOTE — Progress Notes (Signed)
Physical Therapy Treatment Patient Details Name: Yesenia [name] MRN: [id number] DOB: 1960/12/17 Today's Date: 04/15/2013 Time: 0865-7846 PT Time Calculation (min): 23 min  PT Assessment / Plan / Recommendation  History of Present Illness     PT Comments   Pt reports that she could live with daughter but prefers not as there are 2 children there. If she di, she would need a hospital bed. Recommend continuing with CIR consult.   Follow Up Recommendations  CIR;Supervision/Assistance - 24 hour     Does the patient have the potential to tolerate intense rehabilitation     Barriers to Discharge        Equipment Recommendations  None recommended by PT    Recommendations for Other Services    Frequency Min 3X/week   Progress towards PT Goals Progress towards PT goals: Progressing toward goals  Plan Current plan remains appropriate    Precautions / Restrictions Precautions Precautions: Fall Restrictions Weight Bearing Restrictions: No   Pertinent Vitals/Pain Reports legs are painful and a HA has started.    Mobility  Bed Mobility Supine to Sit: 6: Modified independent (Device/Increase time) Sit to Supine: 6: Modified independent (Device/Increase time) Details for Bed Mobility Assistance: Pt had just returned to bed but willing to get up again. Transfers Sit to Stand: From bed;From elevated surface;With upper extremity assist;4: Min guard Stand to Sit: To bed;5: Supervision Details for Transfer Assistance: extra time to get up to standing. Pt leaned forward very far in order to sit down. Ambulation/Gait Ambulation/Gait Assistance: 4: Min assist Ambulation Distance (Feet): 50 Feet Assistive device: Rolling walker Ambulation/Gait Assistance Details: pt would stop and lean over RW, state that her arms were giving out. Gait Pattern: Trunk flexed;Step-to pattern;Decreased step length - right;Decreased step length - left Gait velocity: very slow and halting while walking, leans over  front of walker at times.    Exercises     PT Diagnosis:    PT Problem List:   PT Treatment Interventions:     PT Goals (current goals can now be found in the care plan section)    Visit Information  Last PT Received On: 04/15/13    Subjective Data      Cognition  Cognition Arousal/Alertness: Awake/alert Behavior During Therapy: Anxious Overall Cognitive Status:  (Gets  EMOTIONAL RELATED to DC)    Balance     End of Session PT - End of Session Equipment Utilized During Treatment: Gait belt Activity Tolerance: Patient limited by fatigue;Patient limited by pain Patient left: with call bell/phone within reach;in bed Nurse Communication: Mobility status   GP     Rada Hay 04/15/2013, 1:54 PM Blanchard Kelch PT [phone]

## 2013-04-16 LAB — GLUCOSE, CAPILLARY: Glucose-Capillary: 104 mg/dL — ABNORMAL HIGH (ref 70–99)

## 2013-04-16 MED ORDER — ALPRAZOLAM 1 MG PO TABS: 1.0000 mg | ORAL_TABLET | Freq: Three times a day (TID) | ORAL | Status: DC | PRN

## 2013-04-16 MED ORDER — CEFUROXIME AXETIL 500 MG PO TABS: 500.0000 mg | ORAL_TABLET | Freq: Two times a day (BID) | ORAL | Status: DC

## 2013-04-16 MED ORDER — TOPIRAMATE 25 MG PO TABS: ORAL_TABLET | ORAL | Status: DC

## 2013-04-16 MED ORDER — PROCHLORPERAZINE EDISYLATE 5 MG/ML IJ SOLN
10.0000 mg | Freq: Once | INTRAMUSCULAR | Status: DC
  Filled 2013-04-16: qty 2

## 2013-04-16 MED ORDER — DIPHENHYDRAMINE HCL 50 MG/ML IJ SOLN
25.0000 mg | Freq: Once | INTRAMUSCULAR | Status: DC
Start: 2013-04-16 — End: 2013-04-16
  Filled 2013-04-16: qty 1

## 2013-04-16 NOTE — Progress Notes (Signed)
Occupational Therapy Treatment Patient Details Name: Ineze Serrao MRN: [id number] DOB: 25-Dec-1960 Today's Date: 04/16/2013 Time: 8295-6213 OT Time Calculation (min): 30 min  OT Assessment / Plan / Recommendation        Follow Up Recommendations  SNF                   Plan Discharge plan needs to be updated    Precautions / Restrictions Precautions Precautions: Fall       ADL  Grooming: Wash/dry face;Supervision/safety Toilet Transfer: Moderate assistance Toilet Transfer Method: Sit to stand Toilet Transfer Equipment: Comfort height toilet Toileting - Clothing Manipulation and Hygiene: Minimal assistance Where Assessed - Toileting Clothing Manipulation and Hygiene: Standing Transfers/Ambulation Related to ADLs: Extensive conversation regarding DC planning as pt was not a candidate for CIR. Pt agreeable to SNF      OT Goals(current goals can now be found in the care plan section)    Visit Information  Last OT Received On: 04/16/13 Assistance Needed: +1          Cognition  Cognition Arousal/Alertness: Awake/alert Behavior During Therapy: [facility] for tasks assessed/performed;Flat affect Overall Cognitive Status: Within Functional Limits for tasks assessed    Mobility  Bed Mobility Bed Mobility: Supine to Sit Supine to Sit: 4: Min assist;With rails;HOB elevated Transfers Transfers: Sit to Stand;Stand to Sit Sit to Stand: 4: Min guard;From bed Stand to Sit: 4: Min assist;To chair/3-in-1          End of Session OT - End of Session Activity Tolerance: Patient tolerated treatment well Patient left: in chair;with family/visitor present;with call bell/phone within reach Nurse Communication: Mobility status  GO     Alba Cory 04/16/2013, 12:03 PM

## 2013-04-16 NOTE — Progress Notes (Signed)
Clinical Social Work Department CLINICAL SOCIAL WORK PLACEMENT NOTE 04/16/2013  Patient:  Yesenia [name], Yesenia [name]  Account Number:  [id number] Admit date:  04/11/2013  Clinical Social Worker:  Unk Lightning, LCSW  Date/time:  04/16/2013 12:00 N  Clinical Social Work is seeking post-discharge placement for this patient at the following level of care:   SKILLED NURSING   (*CSW will update this form in Epic as items are completed)   04/16/2013  Patient/family provided with [facility] Department of Clinical Social Work's list of facilities offering this level of care within the geographic area requested by the patient (or if unable, by the patient's family).  04/16/2013  Patient/family informed of their freedom to choose among providers that offer the needed level of care, that participate in Medicare, Medicaid or managed care program needed by the patient, have an available bed and are willing to accept the patient.  04/16/2013  Patient/family informed of [facility]' ownership interest in [facility], as well as of the fact that they are under no obligation to receive care at this facility.  PASARR submitted to EDS on existing # PASARR number received from EDS on   FL2 transmitted to all facilities in geographic area requested by pt/family on  04/13/2013 FL2 transmitted to all facilities within larger geographic area on   Patient informed that his/her managed care company has contracts with or will negotiate with  certain facilities, including the following:     Patient/family informed of bed offers received:  04/16/2013 Patient chooses bed at [facility] [place] [facility] Physician recommends and patient chooses bed at    Patient to be transferred to [facility] [facility] on  04/16/2013 Patient to be transferred to facility by [facility]  The following physician request were entered in Epic:   Additional Comments:

## 2013-04-16 NOTE — Progress Notes (Addendum)
Clinical Social Work  CSW spoke with patient and family at bedside who is agreeable to SNF placement at [facility] in [place]. SNF agreeable to accept patient today. CSW informed patient of facility guidelines regarding Medicaid benefits. CSW prepared DC packet with hard scripts and FL2 included.  CSW provided RN with number to call report. Patient, family, RN all aware of DC plans and agreeable.  CSW coordinated transportation via [facility]. Request # [id number].  Medicaid authorization # [id number] W  Unk Lightning, LCSW  (Coverage for Becky Sax)

## 2013-04-16 NOTE — Progress Notes (Signed)
Pt was stable at time of discharge. Removed IV. Reported off to EMTs and Geophysicist/field seismologist at [facility] [employer]. Pt left with her belongings.

## 2013-04-16 NOTE — Consult Note (Signed)
Physical Medicine and Rehabilitation Consult Reason for Consult: History TIA/migraine headaches Referring Physician: [facility]   HPI: Yesenia [name] is a 52 y.o. right-handed female with history of morbid obesity 326 pounds. TIA, hypertension and diabetes mellitus with peripheral neuropathy. Patient currently resides at [facility] extended stay. Admitted 04/11/2013 with nonspecific abdominal pain and nausea as well as headache for the past few days. Upon arrival to the emergency room noted some disorientation. Urinalysis study negative. The patient was afebrile. CT of the abdomen revealed likely pyelonephritis. Cranial CT scan showed no acute abnormalities. Then also did an MRI in July of 2014 that was also negative. Neurology services consulted for persistent headaches with increased severity located over her left eye. Recent MRA of the head with incidental findings of small aneurysm felt to be incidental and having no bearing on her current headaches. Her altered mental status has continued to improve felt to be related to her pyelonephritis. She had been placed on Topamax for headaches. Physical therapy evaluation is completed and ongoing patient is progressing nicely. Recommendations were made for physical medicine rehabilitation consult to consider inpatient rehabilitation services.   Review of Systems  Musculoskeletal: Positive for myalgias and joint pain.  Neurological: Positive for weakness and headaches.  Psychiatric/Behavioral:       Anxiety .Intermittent bouts of memory loss  All other systems reviewed and are negative.   Past Medical History  Diagnosis Date  . Hypertension   . Stroke   . Diabetes mellitus without complication   . Anemia    Past Surgical History  Procedure Laterality Date  . Leg surgery Right 2007    Surgery x3   Family History  Problem Relation Age of Onset  . Leukemia Brother    Social History:  reports that she has never smoked. She has never used  smokeless tobacco. She reports that she does not drink alcohol or use illicit drugs. Allergies:  Allergies  Allergen Reactions  . Morphine And Related Hives   Medications Prior to Admission  Medication Sig Dispense Refill  . albuterol (PROVENTIL HFA;VENTOLIN HFA) 108 (90 BASE) MCG/ACT inhaler Inhale 2 puffs into the lungs every 6 (six) hours as needed for wheezing.      Marland Kitchen ALPRAZolam (XANAX) 1 MG tablet Take 1 mg by mouth 3 (three) times daily as needed for sleep or anxiety.      Marland Kitchen amLODipine (NORVASC) 10 MG tablet Take 10 mg by mouth daily.       Marland Kitchen aspirin EC 81 MG tablet Take 81 mg by mouth daily.      Marland Kitchen atorvastatin (LIPITOR) 20 MG tablet Take 20 mg by mouth daily.      . beclomethasone (QVAR) 80 MCG/ACT inhaler Inhale 1 puff into the lungs 2 (two) times daily as needed (for wheezing).       . furosemide (LASIX) 20 MG tablet Take 20 mg by mouth daily.      . hydrochlorothiazide (HYDRODIURIL) 25 MG tablet Take 25 mg by mouth daily.      . metFORMIN (GLUCOPHAGE) 500 MG tablet Take 500 mg by mouth daily with breakfast.      . metoprolol succinate (TOPROL-XL) 25 MG 24 hr tablet Take 25 mg by mouth daily.        Home: Home Living Family/patient expects to be discharged to:: Unsure Additional Comments: pt states she may have option of going to her daugthers  Functional History: Prior Function Comments: takes a stand up shower, reports she would not be able to  get out of tub Functional Status:  Mobility: Bed Mobility Bed Mobility: Sit to Supine Supine to Sit: 6: Modified independent (Device/Increase time) Sit to Supine: 6: Modified independent (Device/Increase time) Transfers Transfers: Sit to Stand;Stand to Sit Sit to Stand: From bed;From elevated surface;With upper extremity assist;4: Min guard Sit to Stand: Patient Percentage: 60% Stand to Sit: To bed;5: Supervision Stand to Sit: Patient Percentage: 50% Ambulation/Gait Ambulation/Gait Assistance: 4: Min assist Ambulation/Gait:  Patient Percentage: 60% Ambulation Distance (Feet): 50 Feet Assistive device: Rolling walker Ambulation/Gait Assistance Details: pt would stop and lean over RW, state that her arms were giving out. Gait Pattern: Trunk flexed;Step-to pattern;Decreased step length - right;Decreased step length - left Gait velocity: very slow and halting while walking, leans over front of walker at times.    ADL: ADL Grooming: Wash/dry face;Set up Where Assessed - Grooming: Supine, head of bed up Where Assessed - Upper Body Bathing: Unsupported sitting Lower Body Bathing: +2 Total assistance Where Assessed - Lower Body Bathing: Supported sit to stand Toilet Transfer: +2 Total assistance Toilet Transfer Method: Sit to stand Transfers/Ambulation Related to ADLs: Pt did stand at EOB with 2 assist.  Pt stated she was dizzy. Pt returned to supine.  Discussed DC plan as pt would need ST SNF. Pt not agreeable- but OT stated she would share with SW.  Cognition: Cognition Overall Cognitive Status:  (Gets  EMOTIONAL RELATED to DC) Orientation Level: Oriented X4 Cognition Arousal/Alertness: Awake/alert Behavior During Therapy: Anxious Overall Cognitive Status:  (Gets  EMOTIONAL RELATED to DC)  Blood pressure 129/79, pulse 78, temperature 98 F (36.7 C), temperature source Oral, resp. rate 20, height 5\' 5"  (1.651 m), weight 148 kg (326 lb 4.5 oz), SpO2 95.00%. Physical Exam  Vitals reviewed. Constitutional: She is oriented to person, place, and time.  52 year old obese female  HENT:  Head: Normocephalic.  Eyes:  Pupils round and reactive to light without nystagmus  Neck: Normal range of motion. Neck supple. No thyromegaly present.  Cardiovascular: Normal rate and regular rhythm.   Pulmonary/Chest: Effort normal and breath sounds normal. No respiratory distress.  Abdominal: Soft. Bowel sounds are normal. She exhibits no distension.  Neurological: She is alert and oriented to person, place, and time.   Patient is quite anxious and becomes tearful during exam. She did follow commands  Skin: Skin is warm and dry.    Results for orders placed during the hospital encounter of 04/11/13 (from the past 24 hour(s))  GLUCOSE, CAPILLARY     Status: Abnormal   Collection Time    04/15/13  7:29 AM      Result Value Range   Glucose-Capillary 112 (*) 70 - 99 mg/dL  GLUCOSE, CAPILLARY     Status: None   Collection Time    04/15/13 11:10 AM      Result Value Range   Glucose-Capillary 89  70 - 99 mg/dL  GLUCOSE, CAPILLARY     Status: Abnormal   Collection Time    04/15/13  9:28 PM      Result Value Range   Glucose-Capillary 118 (*) 70 - 99 mg/dL   No results found.  Assessment/Plan: Diagnosis: headaches, multiple medical 1. Does the need for close, 24 hr/day medical supervision in concert with the patient's rehab needs make it unreasonable for this patient to be served in a less intensive setting? No 2. Co-Morbidities requiring supervision/potential complications: see above 3. Due to bowel management, safety and disease management, does the patient require 24 hr/day rehab nursing?  No 4. Does the patient require coordinated care of a physician, rehab nurse, PT, OT to address physical and functional deficits in the context of the above medical diagnosis(es)? No Addressing deficits in the following areas: balance, endurance, locomotion, strength, transferring and bathing 5. Can the patient actively participate in an intensive therapy program of at least 3 hrs of therapy per day at least 5 days per week? Potentially 6. The potential for patient to make measurable gains while on inpatient rehab is fair 7. Anticipated functional outcomes upon discharge from inpatient rehab are n/a with PT, n/a with OT, n/a with SLP. 8. Estimated rehab length of stay to reach the above functional goals is: n/a 9. Does the patient have adequate social supports to accommodate these discharge functional goals?  n/a 10. Anticipated D/C setting: n/a 11. Anticipated post D/C treatments: HH therapy 12. Overall Rehab/Functional Prognosis: good  RECOMMENDATIONS: This patient's condition is appropriate for continued rehabilitative care in the following setting: [facility] or SNF Patient has agreed to participate in recommended program. n/a Note that insurance prior authorization may be required for reimbursement for recommended care.  Comment: doesn't meet functional or medical necessity criteria for CIR.  Ranelle Oyster, MD, [facility] Physical Medicine & Rehabilitation     04/16/2013

## 2013-04-16 NOTE — Discharge Summary (Signed)
Physician Discharge Summary  Yesenia [name] ZOX:[id number] DOB: [birth year] DOA: 04/11/2013  PCP: Egbert Garibaldi, NP  Admit date: 04/11/2013 Discharge date: 04/16/2013  Time spent: 35 minutes  Recommendations for Outpatient Follow-up:  1. Patient to have a scheduled appointment with neurology 2. Please continue Topamax as per below dosing 3. Continue by mouth Ceftin until 04/18/13 4. Patient should see a nutritionist as an outpatient 5. Patient will need to see ophthalmology at nursing facility  Discharge Diagnoses:  Principal Problem:   Encephalopathy acute Active Problems:   CVA (cerebral infarction)   HTN (hypertension)   Anemia   Acute pyelonephritis   Diabetes   Dyslipidemia   Headache(784.0)   Discharge Condition: Stable  Diet recommendation: Heart healthy  Filed Weights   04/14/13 0715 04/15/13 0646 04/16/13 0500  Weight: 146.013 kg (321 lb 14.4 oz) 144.8 kg (319 lb 3.6 oz) 148 kg (326 lb 4.5 oz)    History of present illness:  52 year old African American obese female with supposedly CVA, dyslipidemia, diabetes hypertension admitted from [facility] large with abdominal pain 6/10 intensity positive nausea negative vomiting no constipation. CT scan abdomen revealed focal pyelonephritis and patient started on Rocephin. Please see below  Hospital Course:  1. Likely pyelonephritis-continue empiric ceftriaxone 2 g daily-transitioned in view of urine culture 2 Ceftin 500 twice a day until 04/18/13.  No need to follow repeat CT of the abdomen and pelvis as over read by urology did not show any concerning findings for this area 2. Likely migraine headaches-appreciate greatly neurology input-if she has acute pain patient could be given IV Compazine and Benadryl to help with this Started Topamax 25 mg daily and will follow the schedule as outlined by Amada Jupiter on 04/15/2013- this would include uptitrating 25 mg every week starting from the week of 10/13 to a max dose of 100 mg.  Neurology appointment will be made and patient will be given this prior to discharge 3. Dizziness-multifactorial-does not use prescription glasses, orthostatics were positive. Discontinued metoprolol 12.5 daily-states a little better with ambulation 4. History of recurrent TIAs with co-incidental aneurysm-needs interval MRI in December of this year-CT scan performed on this admission was negative. 5. Renal cyst?-Monitor. See above discussion 6. Diabetes mellitus A1c = 7-blood sugars ranging 104-139-monitor 7. Borderline hypertension-see #2 8. Intertriginous yeast infection-Rx fluconazole   Procedures:  Ct abdomen 10/6   Consultations:  Neurology  Rehab  Discharge Exam: Filed Vitals:   04/16/13 0500  BP: 129/79  Pulse: 78  Temp: 98 F (36.7 C)  Resp: 20    General: EOMi, NCAT Cardiovascular: s1 s2 no m/r/g Respiratory: clear  Discharge Instructions  Discharge Orders   Future Appointments Provider Department Dept Phone   05/03/2013 3:00 PM Levert Feinstein, MD [facility] [phone]   Future Orders Complete By Expires   Diet - low sodium heart healthy  As directed    Discharge instructions  As directed    Comments:     Patient to continue Topamax for chronic migraine As per instructions and will need an outpatient neurologist to see him   Increase activity slowly  As directed        Medication List    STOP taking these medications       hydrochlorothiazide 25 MG tablet  Commonly known as:  HYDRODIURIL      TAKE these medications       albuterol 108 (90 BASE) MCG/ACT inhaler  Commonly known as:  PROVENTIL HFA;VENTOLIN HFA  Inhale 2 puffs into the lungs every  6 (six) hours as needed for wheezing.     ALPRAZolam 1 MG tablet  Commonly known as:  XANAX  Take 1 mg by mouth 3 (three) times daily as needed for sleep or anxiety.     amLODipine 10 MG tablet  Commonly known as:  NORVASC  Take 10 mg by mouth daily.     aspirin EC 81 MG tablet   Take 81 mg by mouth daily.     atorvastatin 20 MG tablet  Commonly known as:  LIPITOR  Take 20 mg by mouth daily.     beclomethasone 80 MCG/ACT inhaler  Commonly known as:  QVAR  Inhale 1 puff into the lungs 2 (two) times daily as needed (for wheezing).     cefUROXime 500 MG tablet  Commonly known as:  CEFTIN  Take 1 tablet (500 mg total) by mouth 2 (two) times daily with a meal.     furosemide 20 MG tablet  Commonly known as:  LASIX  Take 20 mg by mouth daily.     metFORMIN 500 MG tablet  Commonly known as:  GLUCOPHAGE  Take 500 mg by mouth daily with breakfast.     metoprolol succinate 25 MG 24 hr tablet  Commonly known as:  TOPROL-XL  Take 25 mg by mouth daily.     topiramate 25 MG tablet  Commonly known as:  TOPAMAX  Take 25 mg till 10/13, then 50mg  for a week, then 75 for a week, then continue at final dose of 100 mg weekly       Allergies  Allergen Reactions  . Morphine And Related Hives     Follow-up Information   Follow up with JAFFE, ADAM ROBERT, DO On 04/24/2013. (07:45)    Specialty:  Neurology   Contact information:   [address] [place] [address] [phone]         The results of significant diagnostics from this hospitalization (including imaging, microbiology, ancillary and laboratory) are listed below for reference.    Significant Diagnostic Studies: Ct Head Wo Contrast  04/11/2013   *RADIOLOGY REPORT*  Clinical Data: Altered mental status  CT HEAD WITHOUT CONTRAST  Technique:  Contiguous axial images were obtained from the base of the skull through the vertex without contrast.  Comparison: Prior CT from 03/13/2013  Findings: There is no acute intracranial hemorrhage or infarct. Gray-white matter differentiation is preserved. Confluent hypodensity within the periventricular and deep white matter is consistent with chronic microvascular ischemic disease, not significantly changed as compared to the prior examination.  No mass or midline  shift.  CSF containing spaces are within normal limits.  No extra-axial fluid collection.  Calvarium is intact. Orbital soft tissues are within normal limits.  Paranasal sinuses and mastoid air cells are clear.  IMPRESSION: Mild chronic microvascular ischemic changes, unchanged as compared to prior examination.  No acute intracranial process.   Original Report Authenticated By: Rise Mu, M.D.   Ct Abdomen Pelvis W Contrast  04/11/2013   CLINICAL DATA:  Abdominal scars a generalized abdominal pain, greater on the left. Nausea.  EXAM: CT ABDOMEN AND PELVIS WITH CONTRAST  TECHNIQUE: Multidetector CT imaging of the abdomen and pelvis was performed using the standard protocol following bolus administration of intravenous contrast.  CONTRAST:  50mL OMNIPAQUE IOHEXOL 300 MG/ML SOLN, OMNIPAQUE IOHEXOL 300 MG/ML SOLN  COMPARISON:  None.  FINDINGS: Lung bases: There is some respiratory motion limits evaluation of the lung bases. There is no definite consolidation or pleural effusion.  Lung gallbladder, spleen, adrenal glands, and pancreas are normal. There are multiple areas of cortical scarring and/thinning in the right kidney, predominately in the upper pole, suggesting prior infections and/or vesicoureteral reflux. In the lower pole of right kidney posteriorly is a water density lesion measuring 2.7 x 2.8 x 2.5 cm, compatible with an cyst. Adjacent to the periphery of this cyst, in the cortex of the lateral lower pole the right kidney, there is an area of hyperperfusion/hypodensity which could be secondary to infection/ pyelonephritis in the appropriate clinical setting, or could be due to prior insult. There is no perinephric stranding on the right. Two punctate stones are present in the right renal collecting system. There is no hydronephrosis.  The left kidney is normal in size. No left renal mass, hydronephrosis, or stone is seen.  Normal stomach and small bowel loops. Scattered colonic diverticulae  are noted, and appear the uncomplicated. Normal urinary bladder. Uterus is retroverted, with an isointense contour bulge posteriorly toward the left, consistent with an exophytic fibroid. No adnexal mass. Discrete appendix not definitely visualized, but no pericecal inflammatory changes are seen.  Abdominal aorta is normal in caliber. Negative for lymphadenopathy or ascites.  There is degenerative disc disease at L5-S1 the no acute or suspicious bony abnormality. :  IMPRESSION: 1. Small portion of the cortex of the lower pole of the right kidney demonstrates hypoperfusion. Focal pyelonephritis cannot be excluded in the appropriate setting, but this certainly not definite. There is an adjacent water density lesion again the lower pole of the right kidney that is most likely a simple cyst. There are multiple areas of cortical scarring in the right kidney suggesting prior infections and/or history of vesicoureteral reflux. Question if the patient has right flank pain? 2. 2 punctate right renal stones, nonobstructive. 3. Uterine fibroid.  4. Mild colonic diverticulosis, uncomplicated.   Electronically Signed   By: Britta Mccreedy   On: 04/11/2013 08:42    Microbiology: Recent Results (from the past 240 hour(s))  URINE CULTURE     Status: None   Collection Time    04/11/13  7:05 AM      Result Value Range Status   Specimen Description URINE, CATHETERIZED   Final   Special Requests NONE   Final   Culture  Setup Time     Final   Value: 04/11/2013 14:09     Performed at [facility]   Colony Count     Final   Value: NO GROWTH     Performed at [facility]   Culture     Final   Value: NO GROWTH     Performed at [facility]   Report Status 04/12/2013 FINAL   Final  CULTURE, BLOOD (ROUTINE X 2)     Status: None   Collection Time    04/11/13 10:00 AM      Result Value Range Status   Specimen Description BLOOD LEFT ANTECUBITAL   Final   Special Requests BOTTLES DRAWN AEROBIC AND  ANAEROBIC   Final   Culture  Setup Time     Final   Value: 04/11/2013 14:05     Performed at [facility]   Culture     Final   Value:        BLOOD CULTURE RECEIVED NO GROWTH TO DATE CULTURE WILL BE HELD FOR 5 DAYS BEFORE ISSUING A FINAL NEGATIVE REPORT     Performed at [facility]   Report Status PENDING  Incomplete  CULTURE, BLOOD (ROUTINE X 2)     Status: None   Collection Time    04/11/13 10:05 AM      Result Value Range Status   Specimen Description BLOOD RIGHT ANTECUBITAL   Final   Special Requests BOTTLES DRAWN AEROBIC AND ANAEROBIC   Final   Culture  Setup Time     Final   Value: 04/11/2013 14:05     Performed at [facility]   Culture     Final   Value:        BLOOD CULTURE RECEIVED NO GROWTH TO DATE CULTURE WILL BE HELD FOR 5 DAYS BEFORE ISSUING A FINAL NEGATIVE REPORT     Performed at [facility]   Report Status PENDING   Incomplete     Labs: Basic Metabolic Panel:  Recent Labs Lab 04/11/13 0545 04/11/13 1354 04/12/13 0525 04/13/13 0455  NA 142 145 142 143  K 3.7 3.8 3.5 4.0  CL 108 106 104 107  CO2 26 28 26 27   GLUCOSE 111* 107* 111* 117*  BUN 14 12 16 20   CREATININE 0.91 0.92 1.07 1.05  CALCIUM 9.4 8.8 9.1 9.3  MG  --  1.7  --   --   PHOS  --  4.0  --   --    Liver Function Tests:  Recent Labs Lab 04/11/13 0545 04/11/13 1354 04/12/13 0525 04/13/13 0455  AST 18 16 16 14   ALT 20 18 17 15   ALKPHOS 79 77 74 74  BILITOT 0.1* 0.2* 0.2* 0.2*  PROT 6.8 7.2 6.8 6.5  ALBUMIN 3.3* 3.5 3.3* 3.2*    Recent Labs Lab 04/11/13 0545  LIPASE 47   No results found for this basename: AMMONIA,  in the last 168 hours CBC:  Recent Labs Lab 04/11/13 0545 04/11/13 1354 04/12/13 0525 04/13/13 0455  WBC 8.1 8.0 7.3 7.6  NEUTROABS  --  4.6  --  4.2  HGB 11.2* 11.5* 11.5* 11.0*  HCT 34.6* 35.1* 35.3* 33.6*  MCV 86.5 86.2 86.3 87.0  PLT 370 375 370 349   Cardiac Enzymes: No results found for this basename:  CKTOTAL, CKMB, CKMBINDEX, TROPONINI,  in the last 168 hours BNP: BNP (last 3 results) No results found for this basename: PROBNP,  in the last 8760 hours CBG:  Recent Labs Lab 04/14/13 1715 04/14/13 2124 04/15/13 0729 04/15/13 1110 04/15/13 2128  GLUCAP 126* 102* 112* 89 118*       Signed:  Jecenia Leamer, JAI-GURMUKH  [facility] 04/16/2013, 11:28 AM

## 2013-04-16 NOTE — Progress Notes (Signed)
Rehab admissions - Evaluated for possible admission.  Please see rehab consult done today by Dr. Riley Kill.  Lacks medical necessity to support an inpatient rehab admission.  Options will be SNF or HH therapies.  Call me for questions.  #[phone]

## 2013-04-17 LAB — CULTURE, BLOOD (ROUTINE X 2)

## 2013-04-24 ENCOUNTER — Ambulatory Visit: Payer: Medicaid Other | Admitting: Neurology

## 2013-04-27 ENCOUNTER — Encounter: Payer: Self-pay | Admitting: Neurology

## 2013-04-27 ENCOUNTER — Ambulatory Visit (INDEPENDENT_AMBULATORY_CARE_PROVIDER_SITE_OTHER): Payer: Medicaid Other | Admitting: Neurology

## 2013-04-27 VITALS — BP 110/70 | HR 72 | Temp 98.0°F | Ht 67.0 in | Wt 320.0 lb

## 2013-04-27 DIAGNOSIS — G44301 Post-traumatic headache, unspecified, intractable: Secondary | ICD-10-CM

## 2013-04-27 DIAGNOSIS — I639 Cerebral infarction, unspecified: Secondary | ICD-10-CM

## 2013-04-27 DIAGNOSIS — G44309 Post-traumatic headache, unspecified, not intractable: Secondary | ICD-10-CM

## 2013-04-27 DIAGNOSIS — I6350 Cerebral infarction due to unspecified occlusion or stenosis of unspecified cerebral artery: Secondary | ICD-10-CM

## 2013-04-27 MED ORDER — ISOMETHEPTENE-APAP-DICHLORAL 65-325-100 MG PO CAPS: ORAL_CAPSULE | ORAL | Status: DC

## 2013-04-27 MED ORDER — PREDNISONE 10 MG PO TABS: ORAL_TABLET | ORAL | Status: DC

## 2013-04-27 MED ORDER — TOPIRAMATE 50 MG PO TABS: ORAL_TABLET | ORAL | Status: DC

## 2013-04-27 NOTE — Progress Notes (Signed)
NEUROLOGY CONSULTATION NOTE  Yesenia [name] MRN: [id number] DOB: 1960/10/13  Referring provider: Hospital Primary care provider: Marva Panda, NP  Reason for consult:  Headache.  HISTORY OF PRESENT ILLNESS: Yesenia [name] is a 52 year old left-handed woman with history of depression, fibromyalgia, hypertension and recent stroke-like event, who presents for headache.  History somewhat limited, as patient says she has poor memory since ECT 10-15 years ago.  Records and images were personally reviewed where available.    Onset:  Since July after stroke-like symptoms. Location:  over left eye Quality:  throbbing Intensity:  9/10 Aura:  no Associated symptoms:  Nausea, photophobia, osmophobia, occasional flashes of light in both eyes.  No nausea, vomiting, photophobia, phonophobia or osmophobia. Duration:   2 hours.  Often wake up with it Frequency:  4-5x/week Activity:  Lay down Triggers/aggravating factors:  none Relieving factors:  none Abortive therapy:  Previously, Tylenol, NSAIDs ineffective.  Now, Fioricet 2 tabs q4h PRN (helps for two hours).  She also takes percocet every night. Preventative therapy:  Topamax 100mg  daily (since 04/16/13).  This has been ineffective and stopped a couple of days ago.  No history of migraines.  No family history of migraine.    In July, she presented to the hospital with 2 month history of gradually worsening dizziness, ataxia and right sided weakness.  She was admitted from 01/14/13 to 01/16/13.  She underwent a stroke workup.  MRI of brain revealed chronic small vessel disease, but no acute stroke.  MRA of head revealed atherosclerotic changes, as well as 3mm left PComm aneurysm.  Carotid doppler revealed less than 39% ICA stenosis.  Echo revealed EF 50-55% and grade I diastolic dysfunction.  LDL 141, Hgb A1c 5.9 with mean plasma glucose 123.  With no clear stroke on MRI, etiology of right sided weakness was unclear, but neurology suspected possible  psychogenic component.  She was discharged on ASA 81mg  and Lipitor 20mg .    She was readmitted to the hospital from 01/23/13 to 01/25/13 for increased falls and weakness.  In ED, she was found to have acute renal failure secondary to dehydration, as well as UTI.    She was recently admitted to the hospital from 04/11/13 to 04/16/13 for encephalopathy due to pyelonephritis, as well as headache.  CT Head revealed stable mild chronic small vessel disease, but no acute intracranial abnormality.  She was diagnosed with presumed migraine and started on Topamax for the chronic daily headaches and antibiotics for infection.  She is listed as taking Cymbalta, but she tells me this was stopped and now she is on Lyrica.  PAST MEDICAL HISTORY: Past Medical History  Diagnosis Date  . Hypertension   . Stroke   . Diabetes mellitus without complication   . Anemia   . Fibromyalgia     PAST SURGICAL HISTORY: Past Surgical History  Procedure Laterality Date  . Leg surgery Right 2007    Surgery x3  . Oophorectomy      MEDICATIONS: Current Outpatient Prescriptions on File Prior to Visit  Medication Sig Dispense Refill  . albuterol (PROVENTIL HFA;VENTOLIN HFA) 108 (90 BASE) MCG/ACT inhaler Inhale 2 puffs into the lungs every 6 (six) hours as needed for wheezing.      Marland Kitchen ALPRAZolam (XANAX) 1 MG tablet Take 1 tablet (1 mg total) by mouth 3 (three) times daily as needed for sleep or anxiety.  30 tablet  0  . amLODipine (NORVASC) 10 MG tablet Take 10 mg by mouth daily.       Marland Kitchen  aspirin EC 81 MG tablet Take 81 mg by mouth daily.      Marland Kitchen atorvastatin (LIPITOR) 20 MG tablet Take 20 mg by mouth daily.      . beclomethasone (QVAR) 80 MCG/ACT inhaler Inhale 1 puff into the lungs 2 (two) times daily as needed (for wheezing).       . cefUROXime (CEFTIN) 500 MG tablet Take 1 tablet (500 mg total) by mouth 2 (two) times daily with a meal.  8 tablet  8  . furosemide (LASIX) 20 MG tablet Take 20 mg by mouth daily.      .  metFORMIN (GLUCOPHAGE) 500 MG tablet Take 500 mg by mouth daily with breakfast.      . metoprolol succinate (TOPROL-XL) 25 MG 24 hr tablet Take 25 mg by mouth daily.       No current facility-administered medications on file prior to visit.    ALLERGIES: Allergies  Allergen Reactions  . Morphine And Related Hives    FAMILY HISTORY: Family History  Problem Relation Age of Onset  . Leukemia Brother   . Breast cancer Mother   . Breast cancer Maternal Grandmother     SOCIAL HISTORY: History   Social History  . Marital Status: Single    Spouse Name: N/A    Number of Children: N/A  . Years of Education: N/A   Occupational History  . Not on file.   Social History Main Topics  . Smoking status: Never Smoker   . Smokeless tobacco: Never Used  . Alcohol Use: No  . Drug Use: No  . Sexual Activity: No   Other Topics Concern  . Not on file   Social History Narrative  . No narrative on file    REVIEW OF SYSTEMS: Constitutional: No fevers, chills, or sweats, no generalized fatigue, change in appetite Eyes: No visual changes, double vision, eye pain Ear, nose and throat: No hearing loss, ear pain, nasal congestion, sore throat Cardiovascular: No chest pain, palpitations Respiratory:  No shortness of breath at rest or with exertion, wheezes GastrointestinaI: No nausea, vomiting, diarrhea, abdominal pain, fecal incontinence Genitourinary:  No dysuria, urinary retention or frequency Musculoskeletal:  No neck pain, back pain Integumentary: No rash, pruritus, skin lesions Neurological: as above Psychiatric: No depression, insomnia, anxiety Endocrine: No palpitations, fatigue, diaphoresis, mood swings, change in appetite, change in weight, increased thirst Hematologic/Lymphatic:  No anemia, purpura, petechiae. Allergic/Immunologic: no itchy/runny eyes, nasal congestion, recent allergic reactions, rashes  PHYSICAL EXAM: Filed Vitals:   04/27/13 0857  BP: 110/70  Pulse: 72   Temp: 98 F (36.7 C)   General: No acute distress Head:  Normocephalic/atraumatic Neck: supple, no paraspinal tenderness, full range of motion Back: No paraspinal tenderness Heart: regular rate and rhythm Lungs: Clear to auscultation bilaterally. Vascular: No carotid bruits. Neurological Exam: Mental status: alert and oriented to person, place, and time, speech fluent and not dysarthric, language intact. Cranial nerves: CN I: not tested CN II: pupils equal, round and reactive to light, visual fields intact, fundi unremarkable. CN III, IV, VI:  full range of motion, no nystagmus, no ptosis CN V: endorses reduced sensation in right V1-V3 CN VII: upper and lower face symmetric CN VIII: hearing intact CN IX, X: gag intact, uvula midline CN XI: sternocleidomastoid and trapezius muscles intact CN XII: tongue midline Bulk & Tone: normal, no fasciculations. Motor: 5/5 throughout when gives effort Sensation: pinprick and vibration intact Deep Tendon Reflexes: 2+ throughout, toes down Finger to nose testing: no dysmetria Heel to  shin: no dysmetria Gait: maintains that she can only ambulate with walker and would not walk otherwise. Romberg negative.  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  Occurred after stroke-like event.  PLAN: 1.  I would try and titrate Topamax further to goal of 100mg  BID. 2.  For abortive therapy, try Midrin.  I would stop Fioricet, as this is not recommended for chronic daily headaches.  She is not a candidate for triptans as she has stroke risk factors with history of stroke-like event.  Limit use to no more than 2x/week to prevent rebound. 3.  Prednisone taper to break cycle of headache 4.  Continue ASA and statin (LDL goal <LDL) 5.  Mediterranean diet and exercise. 6.  Follow up in 3 months.  Reviewing the chart, it looks like patient has an upcoming appointment at [facility] on 05/03/13.  I informed her that to allow for the best care and continuity in care, the patient must determine which practice she will receive care from moving forward.  We are happy to continue to see her, but if we continue to see a pattern of the patient going back and for the between practices, we will discharge the patient for non-compliance.  Thank you for allowing me to take part in the care of this patient.  Shon Millet, DO  CC:  Marva Panda, NP

## 2013-04-27 NOTE — Patient Instructions (Addendum)
  1.  For abortive medication, take Midrin.  Take 1cap at onset of HA.  May repeat in 4hours.  Stop Fioricet.  Cannot use triptans like imitrex due to history of stroke. 2.  Limit use of pain relievers to no more than 2 to 3 days out of the week.  These medications include acetaminophen, ibuprofen, triptans and narcotics.  This will help reduce risk of rebound headaches. 3.  Keep a headache diary. 4.  Stay adequately hydrated. 5.  Maintain good sleep hygiene. 6.  Maintain proper stress management. 7.  Increase Topamax to 50mg  in morning and 100mg  at bedtime for 1 week, then 100mg  twice daily. 8.  Continue ASA 81mg  and statin (LDL goal <70). 9.  Mediterranean diet and exercise 10.  Prednisone taper to break cycle of daily headaches.  Take 6 tabs x1 day, then 5tabs x 1day, then 4tabs x 1day, then 3tabs x 1day, then 2tabs x1day, then 1tab x1day then 0.5tab x1day then stop.  No NSAIDs during the taper.   11.  I think we should continue the topamax and titrate up further.  50mg  QAM and 100mg  qHS x1wk, then 100mg  BID.  Patient should stay hydrated as there is risk for metabolic acidosis, dehydration and 1% chance of kidney stones. 12.  Follow up in 3 months.

## 2013-05-03 ENCOUNTER — Ambulatory Visit: Payer: Medicaid Other | Admitting: Neurology

## 2013-06-03 ENCOUNTER — Encounter (HOSPITAL_COMMUNITY): Payer: Self-pay | Admitting: Emergency Medicine

## 2013-06-03 ENCOUNTER — Emergency Department (HOSPITAL_COMMUNITY)
Admission: EM | Admit: 2013-06-03 | Discharge: 2013-06-04 | Disposition: A | Discharge status: Home / Self Care | Payer: Medicaid Other | Attending: Emergency Medicine | Admitting: Emergency Medicine

## 2013-06-03 DIAGNOSIS — Z79899 Other long term (current) drug therapy: Secondary | ICD-10-CM | POA: Insufficient documentation

## 2013-06-03 DIAGNOSIS — M6281 Muscle weakness (generalized): Secondary | ICD-10-CM | POA: Insufficient documentation

## 2013-06-03 DIAGNOSIS — M79609 Pain in unspecified limb: Secondary | ICD-10-CM | POA: Insufficient documentation

## 2013-06-03 DIAGNOSIS — D649 Anemia, unspecified: Secondary | ICD-10-CM | POA: Insufficient documentation

## 2013-06-03 DIAGNOSIS — Z8673 Personal history of transient ischemic attack (TIA), and cerebral infarction without residual deficits: Secondary | ICD-10-CM | POA: Insufficient documentation

## 2013-06-03 DIAGNOSIS — Y92009 Unspecified place in unspecified non-institutional (private) residence as the place of occurrence of the external cause: Secondary | ICD-10-CM | POA: Insufficient documentation

## 2013-06-03 DIAGNOSIS — Z885 Allergy status to narcotic agent status: Secondary | ICD-10-CM | POA: Insufficient documentation

## 2013-06-03 DIAGNOSIS — W19XXXA Unspecified fall, initial encounter: Secondary | ICD-10-CM | POA: Insufficient documentation

## 2013-06-03 DIAGNOSIS — I4581 Long QT syndrome: Secondary | ICD-10-CM | POA: Insufficient documentation

## 2013-06-03 DIAGNOSIS — G8929 Other chronic pain: Secondary | ICD-10-CM | POA: Insufficient documentation

## 2013-06-03 DIAGNOSIS — Y9301 Activity, walking, marching and hiking: Secondary | ICD-10-CM | POA: Insufficient documentation

## 2013-06-03 DIAGNOSIS — Z7982 Long term (current) use of aspirin: Secondary | ICD-10-CM | POA: Insufficient documentation

## 2013-06-03 DIAGNOSIS — E119 Type 2 diabetes mellitus without complications: Secondary | ICD-10-CM | POA: Insufficient documentation

## 2013-06-03 DIAGNOSIS — R531 Weakness: Secondary | ICD-10-CM | POA: Insufficient documentation

## 2013-06-03 DIAGNOSIS — IMO0001 Reserved for inherently not codable concepts without codable children: Secondary | ICD-10-CM | POA: Insufficient documentation

## 2013-06-03 DIAGNOSIS — I10 Essential (primary) hypertension: Secondary | ICD-10-CM | POA: Insufficient documentation

## 2013-06-03 HISTORY — DX: Other chronic pain: G89.29

## 2013-06-03 HISTORY — DX: Weakness: R53.1

## 2013-06-03 MED ORDER — ACETAMINOPHEN 325 MG PO TABS: 650.0000 mg | ORAL_TABLET | ORAL | Status: DC | PRN

## 2013-06-03 MED ORDER — ONDANSETRON HCL 4 MG PO TABS: 4.0000 mg | ORAL_TABLET | Freq: Three times a day (TID) | ORAL | Status: DC | PRN

## 2013-06-03 NOTE — ED Notes (Signed)
Pt awaiting on case manager in [facility]OD [facility].

## 2013-06-03 NOTE — ED Provider Notes (Signed)
52 year old female with chronic leg pain medicine for pain medications and relates that she fell at home because of pain in her legs. She is home alone for long periods during the day. She states that she absolutely, positively I will not go into the nursing home. She wished to leave AGAINST MEDICAL ADVICE, but I did convince her to stay overnight to see if case management and set up any assistance for her at home.  I saw and evaluated the patient, reviewed the resident's note and I agree with the findings and plan.  EKG Interpretation    Date/Time:  Sunday June 03 2013 17:55:31 EST Ventricular Rate:  99 PR Interval:  154 QRS Duration: 86 QT Interval:  416 QTC Calculation: 534 R Axis:   14 Text Interpretation:  Sinus rhythm LVH with secondary repolarization abnormality Prolonged QT interval When compared with ECG of 01/14/2013, No significant change was found Confirmed by Preston Fleeting  MD, Krishna Heuer ([id number]) on 06/03/2013 8:55:29 PM              Dione Booze, MD 06/05/13 [phone]

## 2013-06-03 NOTE — ED Notes (Signed)
C/o worsening chronic BLE pain since yesterday. States ran out of pain meds & has not been taking usual amount for past week. Reports it is her fibromyalgia pain.

## 2013-06-03 NOTE — ED Notes (Signed)
Was D/c from nrsg home 05-22-13 after CVA & was receiving lyrica 3-4 times daily for pain. States running out of meds soon so has been decreasing frequency. States last dose lyrica was yesterday. Has  f/u appt Thursday.

## 2013-06-03 NOTE — ED Provider Notes (Signed)
CSN: [id number]     Arrival date & time 06/03/13  1746 History   First MD Initiated Contact with Patient 06/03/13 1854     Chief Complaint  Patient presents with  . Leg Pain   (Consider location/radiation/quality/duration/timing/severity/associated sxs/prior Treatment) HPI Yesenia [name] is a 52 y.o. female who presents to the emergency department for concern of bilateral leg pain.  Patient was recently discharged from skilled nursing facility 1 week ago and since then has been having difficulty getting around secondary to bilateral leg pain.  Today she stood up and she accidentally fell down after she tripped.  She was unable to get up, so she called EMS.  No change in pain.  No head hit.  No abdominal pain.  No chest pain.  Still able to stand, only with assistance.  No other symptoms.  Past Medical History  Diagnosis Date  . Hypertension   . Stroke   . Diabetes mellitus without complication   . Anemia   . Fibromyalgia   . Chronic pain   . Right sided weakness    Past Surgical History  Procedure Laterality Date  . Leg surgery Right 2007    Surgery x3  . Oophorectomy     Family History  Problem Relation Age of Onset  . Leukemia Brother   . Breast cancer Mother   . Breast cancer Maternal Grandmother    History  Substance Use Topics  . Smoking status: Never Smoker   . Smokeless tobacco: Never Used  . Alcohol Use: No   OB History   Grav Para Term Preterm Abortions TAB SAB Ect Mult Living                 Review of Systems  Constitutional: Negative for fever and chills.  HENT: Negative for congestion and rhinorrhea.   Respiratory: Negative for cough and shortness of breath.   Cardiovascular: Negative for chest pain.  Gastrointestinal: Negative for nausea, vomiting, abdominal pain, diarrhea and abdominal distention.  Endocrine: Negative for polyuria.  Genitourinary: Negative for dysuria.  Musculoskeletal: Negative for neck pain and neck stiffness.  Skin: Negative for  rash.  Neurological: Negative for headaches.  Psychiatric/Behavioral: Negative.     Allergies  Morphine and related  Home Medications   Current Outpatient Rx  Name  Route  Sig  Dispense  Refill  . albuterol (PROVENTIL HFA;VENTOLIN HFA) 108 (90 BASE) MCG/ACT inhaler   Inhalation   Inhale 2 puffs into the lungs every 6 (six) hours as needed for wheezing.         Marland Kitchen ALPRAZolam (XANAX) 1 MG tablet   Oral   Take 1 tablet (1 mg total) by mouth 3 (three) times daily as needed for sleep or anxiety.   30 tablet   0   . amLODipine (NORVASC) 10 MG tablet   Oral   Take 10 mg by mouth daily.          Marland Kitchen aspirin EC 81 MG tablet   Oral   Take 81 mg by mouth daily.         Marland Kitchen atorvastatin (LIPITOR) 20 MG tablet   Oral   Take 20 mg by mouth daily.         . beclomethasone (QVAR) 80 MCG/ACT inhaler   Inhalation   Inhale 1 puff into the lungs 2 (two) times daily as needed (for wheezing).          . DULoxetine (CYMBALTA) 60 MG capsule   Oral   Take 60 mg  by mouth daily.         . furosemide (LASIX) 20 MG tablet   Oral   Take 20 mg by mouth daily.         Marland Kitchen gabapentin (NEURONTIN) 300 MG capsule   Oral   Take 300 mg by mouth 4 (four) times daily.         Marland Kitchen isometheptene-acetaminophen-dichloralphenazone (MIDRIN) 65-325-100 MG capsule      Take 1 capsules at onset of headache.  May repeat in 4hours if HA recurs or persists.   60 capsule   3   . metFORMIN (GLUCOPHAGE) 500 MG tablet   Oral   Take 500 mg by mouth daily with breakfast.         . metoprolol succinate (TOPROL-XL) 25 MG 24 hr tablet   Oral   Take 25 mg by mouth daily.         Marland Kitchen topiramate (TOPAMAX) 50 MG tablet   Oral   Take 50 mg by mouth daily.          BP 115/88  Pulse 81  Temp(Src) 97.7 F (36.5 C) (Oral)  Resp 11  Ht 5\' 7"  (1.702 m)  Wt 316 lb (143.337 kg)  BMI 49.48 kg/m2  SpO2 100% Physical Exam  Nursing note and vitals reviewed. Constitutional: She is oriented to person,  place, and time. She appears well-developed and well-nourished. No distress.  HENT:  Head: Normocephalic and atraumatic.  Right Ear: External ear normal.  Left Ear: External ear normal.  Nose: Nose normal.  Mouth/Throat: Oropharynx is clear and moist. No oropharyngeal exudate.  Eyes: EOM are normal. Pupils are equal, round, and reactive to light.  Neck: Normal range of motion. Neck supple. No tracheal deviation present.  Cardiovascular: Normal rate.   Pulmonary/Chest: Effort normal and breath sounds normal. No stridor. No respiratory distress. She has no wheezes. She has no rales.  Abdominal: Soft. She exhibits no distension. There is no tenderness. There is no rebound.  Musculoskeletal: Normal range of motion.  Normal ROM and function of bilateral lower extremities.  2+ pulses.  Still able to stand with assistance.  Neurological: She is alert and oriented to person, place, and time.  Skin: Skin is warm and dry. She is not diaphoretic.    ED Course  Procedures (including critical care time) Labs Review Labs Reviewed - No data to display Imaging Review No results found.  EKG Interpretation    Date/Time:  Sunday June 03 2013 17:55:31 EST Ventricular Rate:  99 PR Interval:  154 QRS Duration: 86 QT Interval:  416 QTC Calculation: 534 R Axis:   14 Text Interpretation:  Sinus rhythm LVH with secondary repolarization abnormality Prolonged QT interval When compared with ECG of 01/14/2013, No significant change was found Confirmed by [facility]  MD, DAVID ([id number]) on 06/03/2013 8:55:29 PM            MDM   1. Leg pain, unspecified laterality     Yesenia [name] is a 53 y.o. female who presents to the emergency department after a mechanical ground level fall at home.  Patient with no evidence of injury on exam.  No acute concerns.  Patient able to stand but requiring much assistance.  No indication for admission at this point in time.  Will have care management see in AM to discuss  placement options.  No workup needed at this point in time.    Arloa Koh, MD 06/04/13 [phone]

## 2013-06-03 NOTE — ED Notes (Signed)
MD at bedside. 

## 2013-06-03 NOTE — Progress Notes (Signed)
On Call CM received call from EDP D. Preston Fleeting concerning a safe disposion plan for patient. Pt was at a SNF several weeks ago used up all of Medicare days, and as per Dr. Preston Fleeting pt is unable to be at home alone due to recurrent falls. CM and SW will need to follow up for a safe discharge plan. Will report off to daytime ED CM.

## 2013-06-04 MED ORDER — DULOXETINE HCL 60 MG PO CPEP
60.0000 mg | ORAL_CAPSULE | Freq: Every day | ORAL | Status: DC
  Administered 2013-06-04: 60 mg via ORAL
  Filled 2013-06-04: qty 1

## 2013-06-04 MED ORDER — AMLODIPINE BESYLATE 10 MG PO TABS
10.0000 mg | ORAL_TABLET | Freq: Every day | ORAL | Status: DC
  Administered 2013-06-04: 10 mg via ORAL
  Filled 2013-06-04: qty 1

## 2013-06-04 MED ORDER — FUROSEMIDE 20 MG PO TABS
20.0000 mg | ORAL_TABLET | Freq: Every day | ORAL | Status: DC
  Administered 2013-06-04: 20 mg via ORAL
  Filled 2013-06-04: qty 1

## 2013-06-04 MED ORDER — ASPIRIN EC 81 MG PO TBEC
81.0000 mg | DELAYED_RELEASE_TABLET | Freq: Every day | ORAL | Status: DC
  Administered 2013-06-04: 81 mg via ORAL
  Filled 2013-06-04: qty 1

## 2013-06-04 MED ORDER — METFORMIN HCL 500 MG PO TABS
500.0000 mg | ORAL_TABLET | Freq: Every day | ORAL | Status: DC
Start: 2013-06-04 — End: 2013-06-04
  Administered 2013-06-04: 500 mg via ORAL
  Filled 2013-06-04: qty 1

## 2013-06-04 MED ORDER — ATORVASTATIN CALCIUM 20 MG PO TABS
20.0000 mg | ORAL_TABLET | Freq: Every day | ORAL | Status: DC
  Administered 2013-06-04: 20 mg via ORAL
  Filled 2013-06-04: qty 1

## 2013-06-04 MED ORDER — METOPROLOL SUCCINATE ER 25 MG PO TB24
25.0000 mg | ORAL_TABLET | Freq: Every day | ORAL | Status: DC
  Administered 2013-06-04: 25 mg via ORAL
  Filled 2013-06-04: qty 1

## 2013-06-04 MED ORDER — GABAPENTIN 300 MG PO CAPS
300.0000 mg | ORAL_CAPSULE | Freq: Four times a day (QID) | ORAL | Status: DC
  Administered 2013-06-04 (×2): 300 mg via ORAL
  Filled 2013-06-04 (×2): qty 1

## 2013-06-04 NOTE — ED Notes (Signed)
PLAN to D/C PT home once [facility] is delivered to room. Pt will call a CAB for ride home.

## 2013-06-04 NOTE — ED Notes (Signed)
Pt at bed side to assess PT ambulation.

## 2013-06-04 NOTE — ED Provider Notes (Signed)
I saw and evaluated the patient, reviewed the resident's note and I agree with the findings and plan.   Dione Booze, MD 06/04/13 [phone]

## 2013-06-04 NOTE — ED Notes (Signed)
TC to 4000, Follow up on PT eval.

## 2013-06-04 NOTE — Progress Notes (Addendum)
ED CM in room to discuss disposition plan with patient. Pt reports, that she has someone that can be at home with her to assist with care. Pt states, that her bathroom is a more than 10 feet from  Her bedroom and toilet is very low. She reported that her fall was in the bathroom attempted to stand and ambulated back to bedroom. Pt denies having a potty chair. Pt will benefit from 3:1 chair at home. Disposition is to  return home and continue with [facility] and order potty chair. Contacted [facility] DME rep. Derrien will delivery 3:1 bariatric chair. Will contact Mary from [facility] to resume [facility] services. Will discuss disposition plan with Dr. Jeraldine Loots and Magda Paganini RN on [place] [facility]. Contacted Brayton Caves CSW in regards to disposition plan. No further CM needs identified. Pt states, she will call a taxi for transport home. Provided number to [employer], will call once patient is ready with discharge papers.

## 2013-06-04 NOTE — Progress Notes (Signed)
ED Case Manager spoke with Fleet Contras.Informed PT evaluation recommendation is SNF/ 24 hour assist.

## 2013-06-04 NOTE — ED Provider Notes (Signed)
  Physical Exam  BP 115/88  Pulse 81  Temp(Src) 97.7 F (36.5 C) (Oral)  Resp 11  Ht 5\' 7"  (1.702 m)  Wt 316 lb (143.337 kg)  BMI 49.48 kg/m2  SpO2 100%  Physical Exam  ED Course  Procedures  MDM  52 y/o with worsening BLE weakness. Comes in with fall. She has hx of stroke, HTN, DM - unable to stand and be stable. SW to see in the AM.  Filed Vitals:   06/03/13 2030  BP: 115/88  Pulse: 81  Temp:   Resp: 11        Tesia Lybrand, MD 06/04/13 0150

## 2013-06-04 NOTE — Evaluation (Signed)
Physical Therapy Evaluation Patient Details Name: Yesenia [name] MRN: [id number] DOB: 1961-05-19 Today's Date: 06/04/2013 Time: 1130-1206 PT Time Calculation (min): 36 min  PT Assessment / Plan / Recommendation History of Present Illness  Pt admitted with mechanical fall at home.  Clinical Impression  Pt at increased falls risk due to R LE weakness and pain. Pt unable to ambulate >10 feet therefore unable to amb to/from bathroom at home safely. Pt requires 24/7 assist for safe d/c home however 24/7 assist is unavailable. Pt to benefit from ST-SNF to progress mobility to safe mod I function for safe transition home.     PT Assessment  Patient needs continued PT services    Follow Up Recommendations  SNF;Supervision/Assistance - 24 hour    Does the patient have the potential to tolerate intense rehabilitation      Barriers to Discharge Decreased caregiver support lives alone    Equipment Recommendations  None recommended by PT    Recommendations for Other Services     Frequency Min 3X/week    Precautions / Restrictions Precautions Precautions: Fall Restrictions Weight Bearing Restrictions: No   Pertinent Vitals/Pain 8/10 R LE pain      Mobility  Bed Mobility Bed Mobility: Supine to Sit;Sitting - Scoot to Edge of Bed Supine to Sit: 4: Min assist;HOB elevated Sitting - Scoot to [place] of Bed: 4: Min assist Details for Bed Mobility Assistance: increased time, labored effort, definite use of railing Transfers Transfers: Sit to Stand;Stand to Sit Sit to Stand: 4: Min assist;With upper extremity assist;From bed Stand to Sit: 4: Min guard;With upper extremity assist;To chair/3-in-1 Details for Transfer Assistance: v/c's for safe hand placement, increased time Ambulation/Gait Ambulation/Gait Assistance: 4: Min assist Ambulation Distance (Feet): 10 Feet Assistive device: Rolling walker Ambulation/Gait Assistance Details: pt with labored effort, difficulty advancing R LE due  to pain. pt reports "It just stops working and becomes weak." Gait Pattern: Step-to pattern;Decreased stride length;Decreased step length - right;Decreased stance time - right Gait velocity: slow Stairs: No    Exercises     PT Diagnosis: Difficulty walking;Generalized weakness;Acute pain  PT Problem List: Decreased strength;Decreased activity tolerance;Decreased balance;Decreased mobility PT Treatment Interventions: DME instruction;Gait training;Stair training;Functional mobility training;Therapeutic activities;Therapeutic exercise;Balance training     PT Goals(Current goals can be found in the care plan section) Acute Rehab PT Goals Patient Stated Goal: to get better PT Goal Formulation: With patient Time For Goal Achievement: 06/11/13 Potential to Achieve Goals: Fair  Visit Information  Last PT Received On: 06/04/13 Assistance Needed: +1 (2nd for chair follow) History of Present Illness: Pt admitted with mechanical fall at home.       Prior Functioning  Home Living Family/patient expects to be discharged to:: Unsure Additional Comments: pt lives alone however unsafe to return home alone but has used up all her SNF days avail through [facility] Prior Function Level of Independence: Needs assistance Gait / Transfers Assistance Needed: uses RW or cane but has had h/o freq falls Communication Communication: No difficulties Dominant Hand: Right    Cognition  Cognition Arousal/Alertness: Awake/alert Behavior During Therapy: WFL for tasks assessed/performed Overall Cognitive Status: Within Functional Limits for tasks assessed    Extremity/Trunk Assessment Upper Extremity Assessment Upper Extremity Assessment: RUE deficits/detail RUE Deficits / Details: limited by pain in elbow Lower Extremity Assessment Lower Extremity Assessment: RLE deficits/detail RLE Deficits / Details: grossly 4/5, limited by onset of pain Cervical / Trunk Assessment Cervical / Trunk Assessment: Normal    Balance    End of Session  PT - End of Session Equipment Utilized During Treatment: Gait belt Activity Tolerance: Patient limited by fatigue;Patient limited by pain Patient left: in chair;with call bell/phone within reach;with family/visitor present Nurse Communication: Mobility status  GP Functional Assessment Tool Used: clinical judgement Functional Limitation: Mobility: Walking and moving around Mobility: Walking and Moving Around Current Status (U9811): At least 40 percent but less than 60 percent impaired, limited or restricted Mobility: Walking and Moving Around Goal Status [id number]): At least 1 percent but less than 20 percent impaired, limited or restricted   Marcene Brawn 06/04/2013, 12:13 PM  Lewis Shock, PT, DPT Pager #: [phone] Office #: [phone]

## 2013-06-04 NOTE — Progress Notes (Signed)
Case manager Consult for Discharge Planning.Met Patient at Bedside role of ED Case Manager explained.Patient verbalizes understanding.Patient reports she was   Discharged from SNF one week ago and has been residing at home with her partner.She fell at home  yesterday and reports Increased bilateral leg weakness  .  Patient Reports she ambulates with a cane and walker and is awaiting [facility] raised toilet seat [facility].Patient has [facility] visits once weekly with [facility] and reports if she has an Increase in services she elects to stay with [facility].Patient reports at home when ambulating she grabs the walls   And furniture education provided on home safety.Patient reports she is compliant with her daily Medications and follows up with her PCP on a regular basis. Patient does   State she would like a new PCP.Education provided on the [facility] and patient provided with a list of Medicaid primary care providers. Patient provided with a   Resource card for the [employer].This Case Manager spoke with [facility] Liaison re the patient plan and the patients RN. Await PT evaluation.

## 2013-06-11 ENCOUNTER — Telehealth: Payer: Self-pay | Admitting: *Deleted

## 2013-06-11 NOTE — Telephone Encounter (Signed)
Pt was referred by [facility] nurse to establish care.  Pt says she currently has a PCP and is not interested in establishing care a [facility].

## 2013-07-13 ENCOUNTER — Telehealth: Payer: Self-pay | Admitting: Neurology

## 2013-07-13 ENCOUNTER — Ambulatory Visit: Payer: Medicaid Other | Admitting: Neurology

## 2013-07-13 NOTE — Telephone Encounter (Signed)
Pt no showed today's 3 month follow up appt. No show letter mailed to pt / Sherri S.

## 2013-07-30 ENCOUNTER — Ambulatory Visit: Payer: Medicaid Other | Admitting: Neurology

## 2013-09-02 ENCOUNTER — Emergency Department (HOSPITAL_COMMUNITY)
Admission: EM | Admit: 2013-09-02 | Discharge: 2013-09-03 | Disposition: A | Discharge status: Home / Self Care | Payer: Medicaid Other | Attending: Emergency Medicine | Admitting: Emergency Medicine

## 2013-09-02 DIAGNOSIS — Z9889 Other specified postprocedural states: Secondary | ICD-10-CM | POA: Insufficient documentation

## 2013-09-02 DIAGNOSIS — G8929 Other chronic pain: Secondary | ICD-10-CM | POA: Insufficient documentation

## 2013-09-02 DIAGNOSIS — Z7982 Long term (current) use of aspirin: Secondary | ICD-10-CM | POA: Insufficient documentation

## 2013-09-02 DIAGNOSIS — IMO0002 Reserved for concepts with insufficient information to code with codable children: Secondary | ICD-10-CM | POA: Insufficient documentation

## 2013-09-02 DIAGNOSIS — Z8673 Personal history of transient ischemic attack (TIA), and cerebral infarction without residual deficits: Secondary | ICD-10-CM | POA: Insufficient documentation

## 2013-09-02 DIAGNOSIS — M545 Low back pain, unspecified: Secondary | ICD-10-CM

## 2013-09-02 DIAGNOSIS — IMO0001 Reserved for inherently not codable concepts without codable children: Secondary | ICD-10-CM | POA: Insufficient documentation

## 2013-09-02 DIAGNOSIS — Z79899 Other long term (current) drug therapy: Secondary | ICD-10-CM | POA: Insufficient documentation

## 2013-09-02 DIAGNOSIS — I10 Essential (primary) hypertension: Secondary | ICD-10-CM | POA: Insufficient documentation

## 2013-09-02 DIAGNOSIS — R262 Difficulty in walking, not elsewhere classified: Secondary | ICD-10-CM | POA: Insufficient documentation

## 2013-09-02 DIAGNOSIS — E119 Type 2 diabetes mellitus without complications: Secondary | ICD-10-CM | POA: Insufficient documentation

## 2013-09-02 DIAGNOSIS — Z862 Personal history of diseases of the blood and blood-forming organs and certain disorders involving the immune mechanism: Secondary | ICD-10-CM | POA: Insufficient documentation

## 2013-09-02 DIAGNOSIS — M79609 Pain in unspecified limb: Secondary | ICD-10-CM | POA: Insufficient documentation

## 2013-09-02 NOTE — ED Notes (Signed)
Bed: [id number] Expected date:  Expected time:  Means of arrival:  Comments: EMS/fibromyalgia pain

## 2013-09-03 ENCOUNTER — Encounter (HOSPITAL_COMMUNITY): Payer: Self-pay | Admitting: Emergency Medicine

## 2013-09-03 MED ORDER — DIAZEPAM 5 MG PO TABS
5.0000 mg | ORAL_TABLET | Freq: Once | ORAL | Status: AC
  Administered 2013-09-03: 5 mg via ORAL
  Filled 2013-09-03: qty 1

## 2013-09-03 MED ORDER — OXYCODONE-ACETAMINOPHEN 5-325 MG PO TABS
1.0000 | ORAL_TABLET | ORAL | Status: DC | PRN
Start: 2013-09-03 — End: 2013-11-21

## 2013-09-03 MED ORDER — KETOROLAC TROMETHAMINE 60 MG/2ML IM SOLN
60.0000 mg | Freq: Once | INTRAMUSCULAR | Status: AC
  Administered 2013-09-03: 60 mg via INTRAMUSCULAR
  Filled 2013-09-03: qty 2

## 2013-09-03 MED ORDER — HYDROMORPHONE HCL PF 1 MG/ML IJ SOLN
1.0000 mg | Freq: Once | INTRAMUSCULAR | Status: AC
  Administered 2013-09-03: 1 mg via INTRAMUSCULAR
  Filled 2013-09-03: qty 1

## 2013-09-03 MED ORDER — CYCLOBENZAPRINE HCL 5 MG PO TABS: 5.0000 mg | ORAL_TABLET | Freq: Three times a day (TID) | ORAL | Status: DC | PRN

## 2013-09-03 NOTE — ED Notes (Signed)
Patient states she has history of fibromyalgia but this pain is different and more severe. She states she was unrelieved with her prescribed pain medications at home. She was unable to bear weight today.

## 2013-09-03 NOTE — Discharge Instructions (Signed)
Back Exercises Back exercises help treat and prevent back injuries. The goal of back exercises is to increase the strength of your abdominal and back muscles and the flexibility of your back. These exercises should be started when you no longer have back pain. Back exercises include:  Pelvic Tilt. Lie on your back with your knees bent. Tilt your pelvis until the lower part of your back is against the floor. Hold this position 5 to 10 sec and repeat 5 to 10 times.  Knee to Chest. Pull first 1 knee up against your chest and hold for 20 to 30 seconds, repeat this with the other knee, and then both knees. This may be done with the other leg straight or bent, whichever feels better.  Sit-Ups or Curl-Ups. Bend your knees 90 degrees. Start with tilting your pelvis, and do a partial, slow sit-up, lifting your trunk only 30 to 45 degrees off the floor. Take at least 2 to 3 seconds for each sit-up. Do not do sit-ups with your knees out straight. If partial sit-ups are difficult, simply do the above but with only tightening your abdominal muscles and holding it as directed.  Hip-Lift. Lie on your back with your knees flexed 90 degrees. Push down with your feet and shoulders as you raise your hips a couple inches off the floor; hold for 10 seconds, repeat 5 to 10 times.  Back arches. Lie on your stomach, propping yourself up on bent elbows. Slowly press on your hands, causing an arch in your low back. Repeat 3 to 5 times. Any initial stiffness and discomfort should lessen with repetition over time.  Shoulder-Lifts. Lie face down with arms beside your body. Keep hips and torso pressed to floor as you slowly lift your head and shoulders off the floor. Do not overdo your exercises, especially in the beginning. Exercises may cause you some mild back discomfort which lasts for a few minutes; however, if the pain is more severe, or lasts for more than 15 minutes, do not continue exercises until you see your caregiver.  Improvement with exercise therapy for back problems is slow.  See your caregivers for assistance with developing a proper back exercise program. Document Released: 08/05/2004 Document Revised: 09/20/2011 Document Reviewed: 04/29/2011 Exit[employer] Patient Information 2014 [employer].  

## 2013-09-03 NOTE — ED Provider Notes (Signed)
CSN: [id number]     Arrival date & time 09/02/13  2353 History   First MD Initiated Contact with Patient 09/03/13 0037     Chief Complaint  Patient presents with  . Back Pain  . Leg Pain      HPI Patient reports worsening low back pain with radiation down her bilateral legs over the past several days.  She states difficulty walking secondary to the pain.  No recent injury or trauma to her back.  No recent heavy lifting.  No history of cancer.  No history of IV drug abuse.  No fevers or chills.  No night sweats.  She denies abdominal pain.  No nausea vomiting or diarrhea.  Her pain is moderate to severe in severity at this time.  She denies weakness of her legs.  Past Medical History  Diagnosis Date  . Hypertension   . Stroke   . Diabetes mellitus without complication   . Anemia   . Fibromyalgia   . Chronic pain   . Right sided weakness    Past Surgical History  Procedure Laterality Date  . Leg surgery Right 2007    Surgery x3  . Oophorectomy     Family History  Problem Relation Age of Onset  . Leukemia Brother   . Breast cancer Mother   . Breast cancer Maternal Grandmother    History  Substance Use Topics  . Smoking status: Never Smoker   . Smokeless tobacco: Never Used  . Alcohol Use: No   OB History   Grav Para Term Preterm Abortions TAB SAB Ect Mult Living                 Review of Systems  All other systems reviewed and are negative.      Allergies  Morphine and related  Home Medications   Current Outpatient Rx  Name  Route  Sig  Dispense  Refill  . albuterol (PROVENTIL HFA;VENTOLIN HFA) 108 (90 BASE) MCG/ACT inhaler   Inhalation   Inhale 2 puffs into the lungs every 6 (six) hours as needed for wheezing.         Marland Kitchen ALPRAZolam (XANAX) 1 MG tablet   Oral   Take 1 tablet (1 mg total) by mouth 3 (three) times daily as needed for sleep or anxiety.   30 tablet   0   . amLODipine (NORVASC) 10 MG tablet   Oral   Take 10 mg by mouth every morning.           Marland Kitchen aspirin EC 81 MG tablet   Oral   Take 81 mg by mouth every morning.          Marland Kitchen atorvastatin (LIPITOR) 20 MG tablet   Oral   Take 20 mg by mouth every evening.          . beclomethasone (QVAR) 80 MCG/ACT inhaler   Inhalation   Inhale 1 puff into the lungs 2 (two) times daily as needed (for wheezing).          . DULoxetine (CYMBALTA) 60 MG capsule   Oral   Take 60 mg by mouth every morning.          . furosemide (LASIX) 20 MG tablet   Oral   Take 20 mg by mouth every morning.          . gabapentin (NEURONTIN) 300 MG capsule   Oral   Take 300 mg by mouth 4 (four) times daily.         Marland Kitchen  metFORMIN (GLUCOPHAGE) 500 MG tablet   Oral   Take 500 mg by mouth daily with breakfast.         . metoprolol succinate (TOPROL-XL) 25 MG 24 hr tablet   Oral   Take 25 mg by mouth every morning.          . topiramate (TOPAMAX) 50 MG tablet   Oral   Take 50 mg by mouth every evening.          . cyclobenzaprine (FLEXERIL) 5 MG tablet   Oral   Take 1 tablet (5 mg total) by mouth 3 (three) times daily as needed for muscle spasms.   12 tablet   0   . oxyCODONE-acetaminophen (PERCOCET/ROXICET) 5-325 MG per tablet   Oral   Take 1 tablet by mouth every 4 (four) hours as needed for severe pain.   15 tablet   0    BP 153/106  Pulse 96  Temp(Src) 98.5 F (36.9 C) (Oral)  Resp 20  SpO2 95% Physical Exam  Nursing note and vitals reviewed. Constitutional: She is oriented to person, place, and time. She appears well-developed and well-nourished. No distress.  HENT:  Head: Normocephalic and atraumatic.  Eyes: EOM are normal.  Neck: Normal range of motion.  Cardiovascular: Normal rate, regular rhythm and normal heart sounds.   Pulmonary/Chest: Effort normal and breath sounds normal.  Abdominal: Soft. She exhibits no distension. There is no tenderness.  Musculoskeletal: Normal range of motion.  No midline L-spine tenderness.  Patient with paralumbar  tenderness with spasm.  5 out of 5 strength in bilateral lower extremity major muscle groups.  Pain in her back exacerbated by flexion of her bilateral legs at the hip  Neurological: She is alert and oriented to person, place, and time.  Skin: Skin is warm and dry. No rash noted. No erythema.  Psychiatric: She has a normal mood and affect. Judgment normal.    ED Course  Procedures (including critical care time) Labs Review Labs Reviewed - No data to display Imaging Review No results found.  EKG Interpretation   None       MDM   Final diagnoses:  Low back pain    Normal lower extremity neurologic exam. No bowel or bladder complaints. No back pain red flags. Likely musculoskeletal back pain. Doubt spinal epidural abscess. Doubt cauda equina. Doubt abdominal aortic aneurysm     Lyanne CoKevin M Adem Costlow, MD 09/03/13 [phone]

## 2013-11-21 ENCOUNTER — Emergency Department (HOSPITAL_COMMUNITY)
Admission: EM | Admit: 2013-11-21 | Discharge: 2013-11-21 | Disposition: A | Discharge status: Home / Self Care | Payer: Medicaid Other | Attending: Emergency Medicine | Admitting: Emergency Medicine

## 2013-11-21 ENCOUNTER — Encounter (HOSPITAL_COMMUNITY): Payer: Self-pay | Admitting: Emergency Medicine

## 2013-11-21 DIAGNOSIS — M797 Fibromyalgia: Secondary | ICD-10-CM

## 2013-11-21 DIAGNOSIS — I10 Essential (primary) hypertension: Secondary | ICD-10-CM | POA: Insufficient documentation

## 2013-11-21 DIAGNOSIS — Z9889 Other specified postprocedural states: Secondary | ICD-10-CM | POA: Insufficient documentation

## 2013-11-21 DIAGNOSIS — R079 Chest pain, unspecified: Secondary | ICD-10-CM | POA: Insufficient documentation

## 2013-11-21 DIAGNOSIS — G8929 Other chronic pain: Secondary | ICD-10-CM

## 2013-11-21 DIAGNOSIS — R52 Pain, unspecified: Secondary | ICD-10-CM | POA: Insufficient documentation

## 2013-11-21 DIAGNOSIS — Z862 Personal history of diseases of the blood and blood-forming organs and certain disorders involving the immune mechanism: Secondary | ICD-10-CM | POA: Insufficient documentation

## 2013-11-21 DIAGNOSIS — Z79899 Other long term (current) drug therapy: Secondary | ICD-10-CM | POA: Insufficient documentation

## 2013-11-21 DIAGNOSIS — R000 Tachycardia, unspecified: Secondary | ICD-10-CM | POA: Insufficient documentation

## 2013-11-21 DIAGNOSIS — IMO0001 Reserved for inherently not codable concepts without codable children: Secondary | ICD-10-CM | POA: Insufficient documentation

## 2013-11-21 DIAGNOSIS — I69959 Hemiplegia and hemiparesis following unspecified cerebrovascular disease affecting unspecified side: Secondary | ICD-10-CM | POA: Insufficient documentation

## 2013-11-21 DIAGNOSIS — IMO0002 Reserved for concepts with insufficient information to code with codable children: Secondary | ICD-10-CM | POA: Insufficient documentation

## 2013-11-21 DIAGNOSIS — R0602 Shortness of breath: Secondary | ICD-10-CM | POA: Insufficient documentation

## 2013-11-21 DIAGNOSIS — E119 Type 2 diabetes mellitus without complications: Secondary | ICD-10-CM | POA: Insufficient documentation

## 2013-11-21 DIAGNOSIS — Z7982 Long term (current) use of aspirin: Secondary | ICD-10-CM | POA: Insufficient documentation

## 2013-11-21 LAB — BASIC METABOLIC PANEL
BUN: 27 mg/dL — ABNORMAL HIGH (ref 6–23)
CHLORIDE: 106 meq/L (ref 96–112)
CO2: 28 mEq/L (ref 19–32)
Calcium: 10.3 mg/dL (ref 8.4–10.5)
Creatinine, Ser: 1.16 mg/dL — ABNORMAL HIGH (ref 0.50–1.10)
GFR calc non Af Amer: 53 mL/min — ABNORMAL LOW (ref >90)
GFR, EST AFRICAN AMERICAN: 62 mL/min — AB (ref >90)
Glucose, Bld: 136 mg/dL — ABNORMAL HIGH (ref 70–99)
POTASSIUM: 3.5 meq/L — AB (ref 3.7–5.3)
Sodium: 146 mEq/L (ref 137–147)

## 2013-11-21 LAB — URINALYSIS, ROUTINE W REFLEX MICROSCOPIC
BILIRUBIN URINE: NEGATIVE
GLUCOSE, UA: NEGATIVE mg/dL
KETONES UR: NEGATIVE mg/dL
LEUKOCYTES UA: NEGATIVE
NITRITE: NEGATIVE
PH: 5 (ref 5.0–8.0)
Protein, ur: NEGATIVE mg/dL
SPECIFIC GRAVITY, URINE: 1.023 (ref 1.005–1.030)
Urobilinogen, UA: 0.2 mg/dL (ref 0.0–1.0)

## 2013-11-21 LAB — CBC
HCT: 37.1 % (ref 36.0–46.0)
HEMOGLOBIN: 12.1 g/dL (ref 12.0–15.0)
MCH: 29 pg (ref 26.0–34.0)
MCHC: 32.6 g/dL (ref 30.0–36.0)
MCV: 89 fL (ref 78.0–100.0)
Platelets: 310 10*3/uL (ref 150–400)
RBC: 4.17 MIL/uL (ref 3.87–5.11)
RDW: 14.7 % (ref 11.5–15.5)
WBC: 8.5 10*3/uL (ref 4.0–10.5)

## 2013-11-21 LAB — URINE MICROSCOPIC-ADD ON

## 2013-11-21 MED ORDER — SODIUM CHLORIDE 0.9 % IV SOLN
Freq: Once | INTRAVENOUS | Status: AC
Start: 2013-11-21 — End: 2013-11-21
  Administered 2013-11-21: 15:00:00 via INTRAVENOUS

## 2013-11-21 MED ORDER — KETOROLAC TROMETHAMINE 10 MG PO TABS: 10.0000 mg | ORAL_TABLET | Freq: Four times a day (QID) | ORAL | Status: DC | PRN

## 2013-11-21 MED ORDER — HYDROMORPHONE HCL PF 1 MG/ML IJ SOLN
1.0000 mg | Freq: Once | INTRAMUSCULAR | Status: AC
  Administered 2013-11-21: 1 mg via INTRAVENOUS
  Filled 2013-11-21: qty 1

## 2013-11-21 MED ORDER — ONDANSETRON HCL 4 MG/2ML IJ SOLN
4.0000 mg | Freq: Once | INTRAMUSCULAR | Status: AC
  Administered 2013-11-21: 4 mg via INTRAVENOUS
  Filled 2013-11-21: qty 2

## 2013-11-21 NOTE — Discharge Instructions (Signed)
Chronic Pain Discharge Instructions  °Emergency care providers appreciate that many patients coming to us are in severe pain and we wish to address their pain in the safest, most responsible manner.  It is important to recognize however, that the proper treatment of chronic pain differs from that of the pain of injuries and acute illnesses.  Our goal is to provide quality, safe, personalized care and we thank you for giving us the opportunity to serve you. °The use of narcotics and related agents for chronic pain syndromes may lead to additional physical and psychological problems.  Nearly as many people die from prescription narcotics each year as die from car crashes.  Additionally, this risk is increased if such prescriptions are obtained from a variety of sources.  Therefore, only your primary care physician or a pain management specialist is able to safely treat such syndromes with narcotic medications long-term.   ° °Documentation revealing such prescriptions have been sought from multiple sources may prohibit us from providing a refill or different narcotic medication.  Your name may be checked first through the [place] Controlled Substances Reporting System.  This database is a record of controlled substance medication prescriptions that the patient has received.  This has been established by [facility] in an effort to eliminate the dangerous, and often life threatening, practice of obtaining multiple prescriptions from different medical providers.  ° °If you have a chronic pain syndrome (i.e. chronic headaches, recurrent back or neck pain, dental pain, abdominal or pelvis pain without a specific diagnosis, or neuropathic pain such as fibromyalgia) or recurrent visits for the same condition without an acute diagnosis, you may be treated with non-narcotics and other non-addictive medicines.  Allergic reactions or negative side effects that may be reported by a patient to such medications will not  typically lead to the use of a narcotic analgesic or other controlled substance as an alternative. °  °Patients managing chronic pain with a personal physician should have provisions in place for breakthrough pain.  If you are in crisis, you should call your physician.  If your physician directs you to the emergency department, please have the doctor call and speak to our attending physician concerning your care. °  °When patients come to the Emergency Department (ED) with acute medical conditions in which the Emergency Department physician feels appropriate to prescribe narcotic or sedating pain medication, the physician will prescribe these in very limited quantities.  The amount of these medications will last only until you can see your primary care physician in his/her office.  Any patient who returns to the ED seeking refills should expect only non-narcotic pain medications.  ° °In the event of an acute medical condition exists and the emergency physician feels it is necessary that the patient be given a narcotic or sedating medication -  a responsible adult driver should be present in the room prior to the medication being given by the nurse. °  °Prescriptions for narcotic or sedating medications that have been lost, stolen or expired will not be refilled in the Emergency Department.   ° °Patients who have chronic pain may receive non-narcotic prescriptions until seen by their primary care physician.  It is every patient’s personal responsibility to maintain active prescriptions with his or her primary care physician or specialist. °

## 2013-11-21 NOTE — ED Provider Notes (Signed)
CSN: [id number]     Arrival date & time 11/21/13  1136 History   First MD Initiated Contact with Patient 11/21/13 1225     Chief Complaint  Patient presents with  . Pain     (Consider location/radiation/quality/duration/timing/severity/associated sxs/prior Treatment) The history is provided by the patient and medical records. No language interpreter was used.    [name] is a(n) 53 y.o. female who presents emergency chief complaint of pain from head to toe. She has a  past medical history of chronic fibromyalgia, hypertension, previous stroke with mild right-sided deficit., Chronic pain, and diabetes. Patient states that she takes Cymbalta for her fibromyalgia. She says she has chronic pain but over the past 2 days she has pain all over her body in her arms, legs, eyes, skin, hair and back. She states that her pain is uncontrolled at home. She does not take any narcotic pain medications. She has not tried any over-the-counter Advil or Tylenol for relief. She states that today she had a lot of difficulty walking due to the pain in her legs but denies any weakness, gait abnormalities, difficulty with speech, swallowing or facial droop. Denies fevers, chills, myalgias, arthralgias. Denies DOE, SOB, chest tightness or pressure, radiation to left arm, jaw or back, or diaphoresis. Denies dysuria, flank pain, suprapubic pain, frequency, urgency, or hematuria. Denies headaches, light headedness, weakness, visual disturbances. Denies abdominal pain, nausea, vomiting, diarrhea or constipation.    Past Medical History  Diagnosis Date  . Hypertension   . Stroke   . Diabetes mellitus without complication   . Anemia   . Fibromyalgia   . Chronic pain   . Right sided weakness    Past Surgical History  Procedure Laterality Date  . Leg surgery Right 2007    Surgery x3  . Oophorectomy     Family History  Problem Relation Age of Onset  . Leukemia Brother   . Breast cancer Mother   . Breast  cancer Maternal Grandmother    History  Substance Use Topics  . Smoking status: Never Smoker   . Smokeless tobacco: Never Used  . Alcohol Use: No   OB History   Grav Para Term Preterm Abortions TAB SAB Ect Mult Living                 Review of Systems  Ten systems reviewed and are negative for acute change, except as noted in the HPI.    Allergies  Morphine and related  Home Medications   Prior to Admission medications   Medication Sig Start Date End Date Taking? Authorizing Provider  albuterol (PROVENTIL HFA;VENTOLIN HFA) 108 (90 BASE) MCG/ACT inhaler Inhale 2 puffs into the lungs every 6 (six) hours as needed for wheezing.   Yes Historical Provider, MD  amLODipine (NORVASC) 10 MG tablet Take 10 mg by mouth every morning.    Yes Historical Provider, MD  aspirin EC 81 MG tablet Take 81 mg by mouth every morning.    Yes Historical Provider, MD  atorvastatin (LIPITOR) 20 MG tablet Take 20 mg by mouth every evening.    Yes Historical Provider, MD  beclomethasone (QVAR) 80 MCG/ACT inhaler Inhale 1 puff into the lungs 2 (two) times daily as needed (for wheezing).    Yes Historical Provider, MD  clonazePAM (KLONOPIN) 1 MG tablet Take 1 mg by mouth at bedtime.   Yes Historical Provider, MD  cyclobenzaprine (FLEXERIL) 5 MG tablet Take 1 tablet (5 mg total) by mouth 3 (three) times daily as  needed for muscle spasms. 09/03/13  Yes Lyanne CoKevin M Campos, MD  DULoxetine (CYMBALTA) 60 MG capsule Take 60 mg by mouth every morning.    Yes Historical Provider, MD  furosemide (LASIX) 20 MG tablet Take 20 mg by mouth every morning.    Yes Historical Provider, MD  metoprolol succinate (TOPROL-XL) 25 MG 24 hr tablet Take 25 mg by mouth every morning.    Yes Historical Provider, MD  traZODone (DESYREL) 100 MG tablet Take 100 mg by mouth at bedtime.   Yes Historical Provider, MD   BP 119/75  Pulse 88  Temp(Src) 98 F (36.7 C) (Oral)  Resp 16  Ht 5\' 8"  (1.727 m)  Wt 325 lb (147.419 kg)  BMI 49.43 kg/m2   SpO2 96% Physical Exam Physical Exam  Nursing note and vitals reviewed. Constitutional: Morbidly obese female in no acute distress. She appears uncomfortable  HENT:  Head: Normocephalic and atraumatic.  Eyes: Conjunctivae normal and EOM are normal. Pupils are equal, round, and reactive to light. No scleral icterus.  Neck: Normal range of motion.  Cardiovascular: Normal rate, regular rhythm and normal heart sounds.  Exam reveals no gallop and no friction rub.   No murmur heard. Pulmonary/Chest: Effort normal and breath sounds normal. No respiratory distress.  Abdominal: Soft. Bowel sounds are normal. She exhibits no distension and no mass. There is no tenderness. There is no guarding.  Neurological: She is alert and oriented to person, place, and time.  Speech is clear and goal oriented, follows commands Major Cranial nerves without deficit, no facial droop Normal strength in upper and lower extremities bilaterally including dorsiflexion and plantar flexion, strong and equal grip strength Sensation normal to light and sharp touch Moves extremities without ataxia, coordination intact Skin: Skin is warm and dry. She is not diaphoretic.     ED Course  Procedures (including critical care time) Labs Review Labs Reviewed - No data to display  Imaging Review No results found.   EKG Interpretation None      MDM   Final diagnoses:  None    Filed Vitals:   11/21/13 1515 11/21/13 1545 11/21/13 1608 11/21/13 1615  BP: 143/85 137/88  143/90  Pulse: 102 106 95 102  Temp:      TempSrc:      Resp:      Height:      Weight:      SpO2: 99% 95%  94%     Patient with mild hypertension, mild tachycardia, chest pain or shortness of breath. She appears to be dehydrated as she has an elevation in her proBNP as well as elevation of BUN. Her urine is contaminated    Filed Vitals:   11/21/13 1733  BP: 115/68  Pulse: 76  Temp:   Resp: 17   Patient given 2 doses of Dilaudid with  significant improvement in her pain.  Able to ambulate here in the ED without assistance.  No concern for stroke or myalgias form systmeic infection. I feel this is related to her fibromyalgia and patient agrees. She will be discharged home. Encouraged pain meds. 10 mg Toradol at discharge. Not on anticoagulants. The patient appears reasonably screened and/or stabilized for discharge and I doubt any other medical condition or other [facility] requiring further screening, evaluation, or treatment in the ED at this time prior to discharge.   Arthor CaptainAbigail Abdinasir Spadafore, PA-C 11/23/13 [phone]

## 2013-11-21 NOTE — ED Notes (Signed)
PA Abigail at bedside.  

## 2013-11-21 NOTE — ED Notes (Signed)
Patient  sts feels nauseated. Patient given emesis bag and alcohol pad to smell. Pt sts nausea decreased.

## 2013-11-21 NOTE — ED Notes (Addendum)
Pt presents to department via [facility] for evaluation of pain all over body. Onset Tuesday. History of fibromyalgia, "throbbing" sensation all over body. Pt is alert and oriented x4. No signs of distress noted. CBG 184.

## 2013-11-21 NOTE — ED Notes (Signed)
Pt reports generalized pain since last night. States it feels like her fibromyalgia but states "it is much worse". Pt reports pain to lower back, bilateral legs, bilateral arms, fingers and a slight HA. Neuro intact. Denies chest pain, abdominal pain, dysuria. AO x4. NAD

## 2013-11-26 NOTE — ED Provider Notes (Signed)
Medical screening examination/treatment/procedure(s) were performed by non-physician practitioner and as supervising physician I was immediately available for consultation/collaboration.   EKG Interpretation None        Dagmar HaitWilliam Judythe Postema, MD 11/26/13 2253

## 2014-04-16 ENCOUNTER — Emergency Department (HOSPITAL_COMMUNITY): Payer: Medicaid Other

## 2014-04-16 ENCOUNTER — Encounter (HOSPITAL_COMMUNITY): Payer: Self-pay | Admitting: Emergency Medicine

## 2014-04-16 ENCOUNTER — Emergency Department (HOSPITAL_COMMUNITY)
Admission: EM | Admit: 2014-04-16 | Discharge: 2014-04-16 | Disposition: A | Discharge status: Home / Self Care | Payer: Medicaid Other | Attending: Emergency Medicine | Admitting: Emergency Medicine

## 2014-04-16 DIAGNOSIS — E119 Type 2 diabetes mellitus without complications: Secondary | ICD-10-CM | POA: Insufficient documentation

## 2014-04-16 DIAGNOSIS — D649 Anemia, unspecified: Secondary | ICD-10-CM | POA: Insufficient documentation

## 2014-04-16 DIAGNOSIS — R4789 Other speech disturbances: Secondary | ICD-10-CM | POA: Insufficient documentation

## 2014-04-16 DIAGNOSIS — Z79899 Other long term (current) drug therapy: Secondary | ICD-10-CM | POA: Diagnosis not present

## 2014-04-16 DIAGNOSIS — G8929 Other chronic pain: Secondary | ICD-10-CM | POA: Diagnosis not present

## 2014-04-16 DIAGNOSIS — Z8673 Personal history of transient ischemic attack (TIA), and cerebral infarction without residual deficits: Secondary | ICD-10-CM | POA: Diagnosis not present

## 2014-04-16 DIAGNOSIS — Z7982 Long term (current) use of aspirin: Secondary | ICD-10-CM | POA: Diagnosis not present

## 2014-04-16 DIAGNOSIS — I10 Essential (primary) hypertension: Secondary | ICD-10-CM | POA: Insufficient documentation

## 2014-04-16 DIAGNOSIS — M797 Fibromyalgia: Secondary | ICD-10-CM | POA: Diagnosis not present

## 2014-04-16 DIAGNOSIS — R4701 Aphasia: Secondary | ICD-10-CM | POA: Diagnosis present

## 2014-04-16 DIAGNOSIS — R479 Unspecified speech disturbances: Secondary | ICD-10-CM

## 2014-04-16 DIAGNOSIS — Z8739 Personal history of other diseases of the musculoskeletal system and connective tissue: Secondary | ICD-10-CM | POA: Insufficient documentation

## 2014-04-16 LAB — CBC
HCT: 35.4 % — ABNORMAL LOW (ref 36.0–46.0)
Hemoglobin: 11.6 g/dL — ABNORMAL LOW (ref 12.0–15.0)
MCH: 28.6 pg (ref 26.0–34.0)
MCHC: 32.8 g/dL (ref 30.0–36.0)
MCV: 87.2 fL (ref 78.0–100.0)
PLATELETS: 276 10*3/uL (ref 150–400)
RBC: 4.06 MIL/uL (ref 3.87–5.11)
RDW: 14.1 % (ref 11.5–15.5)
WBC: 6.8 10*3/uL (ref 4.0–10.5)

## 2014-04-16 LAB — COMPREHENSIVE METABOLIC PANEL
ALT: 15 U/L (ref 0–35)
AST: 15 U/L (ref 0–37)
Albumin: 3.4 g/dL — ABNORMAL LOW (ref 3.5–5.2)
Alkaline Phosphatase: 70 U/L (ref 39–117)
Anion gap: 14 (ref 5–15)
BUN: 20 mg/dL (ref 6–23)
CALCIUM: 9.2 mg/dL (ref 8.4–10.5)
CO2: 25 mEq/L (ref 19–32)
Chloride: 103 mEq/L (ref 96–112)
Creatinine, Ser: 1.26 mg/dL — ABNORMAL HIGH (ref 0.50–1.10)
GFR calc non Af Amer: 48 mL/min — ABNORMAL LOW (ref >90)
GFR, EST AFRICAN AMERICAN: 56 mL/min — AB (ref >90)
Glucose, Bld: 127 mg/dL — ABNORMAL HIGH (ref 70–99)
POTASSIUM: 3.5 meq/L — AB (ref 3.7–5.3)
SODIUM: 142 meq/L (ref 137–147)
TOTAL PROTEIN: 7.4 g/dL (ref 6.0–8.3)
Total Bilirubin: 0.2 mg/dL — ABNORMAL LOW (ref 0.3–1.2)

## 2014-04-16 LAB — DIFFERENTIAL
BASOS PCT: 0 % (ref 0–1)
Basophils Absolute: 0 10*3/uL (ref 0.0–0.1)
Eosinophils Absolute: 0.2 10*3/uL (ref 0.0–0.7)
Eosinophils Relative: 3 % (ref 0–5)
LYMPHS ABS: 2.3 10*3/uL (ref 0.7–4.0)
Lymphocytes Relative: 34 % (ref 12–46)
Monocytes Absolute: 0.6 10*3/uL (ref 0.1–1.0)
Monocytes Relative: 9 % (ref 3–12)
NEUTROS PCT: 54 % (ref 43–77)
Neutro Abs: 3.7 10*3/uL (ref 1.7–7.7)

## 2014-04-16 LAB — CBG MONITORING, ED: Glucose-Capillary: 122 mg/dL — ABNORMAL HIGH (ref 70–99)

## 2014-04-16 LAB — PROTIME-INR
INR: 1.1 (ref 0.00–1.49)
Prothrombin Time: 14.2 seconds (ref 11.6–15.2)

## 2014-04-16 LAB — I-STAT TROPONIN, ED: Troponin i, poc: 0.01 ng/mL (ref 0.00–0.08)

## 2014-04-16 LAB — APTT: APTT: 33 s (ref 24–37)

## 2014-04-16 MED ORDER — LORAZEPAM 2 MG/ML IJ SOLN
1.0000 mg | Freq: Once | INTRAMUSCULAR | Status: AC
Start: 2014-04-16 — End: 2014-04-16
  Administered 2014-04-16: 1 mg via INTRAVENOUS
  Filled 2014-04-16: qty 1

## 2014-04-16 NOTE — ED Provider Notes (Signed)
CSN: [id number]     Arrival date & time 04/16/14  1039 History   First MD Initiated Contact with Patient 04/16/14 1203     Chief Complaint  Patient presents with  . Aphasia  . Weakness      HPI Patient's complaint of 3 to four-day history of speech difficulty and problems walking with her balance.  She has some dizziness.  Patient has history of hypertension, fibromyalgia, chronic pain. Past Medical History  Diagnosis Date  . Hypertension   . Stroke   . Diabetes mellitus without complication   . Anemia   . Fibromyalgia   . Chronic pain   . Right sided weakness    Past Surgical History  Procedure Laterality Date  . Leg surgery Right 2007    Surgery x3  . Oophorectomy     Family History  Problem Relation Age of Onset  . Leukemia Brother   . Breast cancer Mother   . Breast cancer Maternal Grandmother    History  Substance Use Topics  . Smoking status: Never Smoker   . Smokeless tobacco: Never Used  . Alcohol Use: No   OB History   Grav Para Term Preterm Abortions TAB SAB Ect Mult Living                 Review of Systems  All other systems reviewed and are negative  Allergies  Morphine and related  Home Medications   Prior to Admission medications   Medication Sig Start Date End Date Taking? Authorizing Provider  albuterol (PROVENTIL HFA;VENTOLIN HFA) 108 (90 BASE) MCG/ACT inhaler Inhale 2 puffs into the lungs every 6 (six) hours as needed for wheezing.    Historical Provider, MD  amLODipine (NORVASC) 10 MG tablet Take 10 mg by mouth every morning.     Historical Provider, MD  aspirin EC 81 MG tablet Take 81 mg by mouth every morning.     Historical Provider, MD  atorvastatin (LIPITOR) 20 MG tablet Take 20 mg by mouth every evening.     Historical Provider, MD  beclomethasone (QVAR) 80 MCG/ACT inhaler Inhale 1 puff into the lungs 2 (two) times daily as needed (for wheezing).     Historical Provider, MD  clonazePAM (KLONOPIN) 1 MG tablet Take 1 mg by mouth at  bedtime.    Historical Provider, MD  cyclobenzaprine (FLEXERIL) 5 MG tablet Take 1 tablet (5 mg total) by mouth 3 (three) times daily as needed for muscle spasms. 09/03/13   Lyanne CoKevin M Campos, MD  DULoxetine (CYMBALTA) 60 MG capsule Take 60 mg by mouth every morning.     Historical Provider, MD  furosemide (LASIX) 20 MG tablet Take 20 mg by mouth every morning.     Historical Provider, MD  ketorolac (TORADOL) 10 MG tablet Take 1 tablet (10 mg total) by mouth every 6 (six) hours as needed. 11/21/13   Arthor CaptainAbigail Harris, PA-C  metoprolol succinate (TOPROL-XL) 25 MG 24 hr tablet Take 25 mg by mouth every morning.     Historical Provider, MD  traZODone (DESYREL) 100 MG tablet Take 100 mg by mouth at bedtime.    Historical Provider, MD   BP 125/97  Pulse 87  Temp(Src) 97.9 F (36.6 C) (Oral)  Resp 18  Ht 5\' 7"  (1.702 m)  Wt 315 lb (142.883 kg)  BMI 49.32 kg/m2  SpO2 96% Physical Exam Physical Exam  Nursing note and vitals reviewed. Constitutional: She is oriented to person, place, and time. She appears well-developed and well-nourished. No distress.  HENT:  Head: Normocephalic and atraumatic.   Eyes: Pupils are equal, round, and reactive to light.  Neck: Normal range of motion.  Cardiovascular: Normal rate and intact distal pulses.   Pulmonary/Chest: No respiratory distress.  Abdominal: Normal appearance. She exhibits no distension.  Musculoskeletal: Normal range of motion.  Neurological: She is alert and oriented to person, place, and time. No cranial nerve deficit.  patient initially said she was having speech difficulty but after you've with Alecia Lemming her for while her speech would clear and then she suddenly would begin having stuttering speech again.  Skin: Skin is warm and dry. No rash noted.  Psychiatric: She has a normal mood and affect. Her behavior is normal.   ED Course  Procedures (including critical care time) Labs Review Labs Reviewed  CBC - Abnormal; Notable for the following:     Hemoglobin 11.6 (*)    HCT 35.4 (*)    All other components within normal limits  COMPREHENSIVE METABOLIC PANEL - Abnormal; Notable for the following:    Potassium 3.5 (*)    Glucose, Bld 127 (*)    Creatinine, Ser 1.26 (*)    Albumin 3.4 (*)    Total Bilirubin 0.2 (*)    GFR calc non Af Amer 48 (*)    GFR calc Af Amer 56 (*)    All other components within normal limits  CBG MONITORING, ED - Abnormal; Notable for the following:    Glucose-Capillary 122 (*)    All other components within normal limits  DIFFERENTIAL  PROTIME-INR  APTT  I-STAT TROPOININ, ED    Imaging Review Ct Head (brain) Wo Contrast  04/16/2014   CLINICAL DATA:  Slurred speech x3 days.  Difficulty with balance.  EXAM: CT HEAD WITHOUT CONTRAST  TECHNIQUE: Contiguous axial images were obtained from the base of the skull through the vertex without intravenous contrast.  COMPARISON:  04/11/2013 .  FINDINGS: No intra-axial or extra-axial pathologic fluid or blood collection. Periventricular white matter changes of chronic ischemic again noted. No mass lesion. No hydrocephalus. No acute bony abnormality. Mucosal thickening noted of the maxillary sinuses.  IMPRESSION: Periventricular white matter changes again noted consistent with chronic ischemia.   Electronically Signed   By: Maisie Fus  Register   On: 04/16/2014 13:20     EKG Interpretation None     Patient was seen by neurology who felt that her gait abnormalities are secondary to pain issues.  He did not feel like her speech patterns were consistent with any pathology.  He felt there was some supratentorial issues at play.  He recommended her he discharged if her MRI is negative. MDM   Final diagnoses:  None        Nelia Shi, MD 04/20/14 2342

## 2014-04-16 NOTE — Consult Note (Signed)
NEURO HOSPITALIST CONSULT NOTE    Reason for Consult: slurred speech and gait difficulty  HPI:                                                                                                                                          Yesenia [name] is an 53 y.o. female brought to ED due to having a 3-4 day history of difficulty walking and stuttering speech.  Patient is a poor historian but does state 3 days ago she noted she was having difficulty getting up and walking. She states it was progressive and more due to proximal thigh pain. She states the pain progressively worsened and 2 days ago she noted her speech was stuttering.  Her main complaint at this time is thigh pain when moving her legs and inability to walk due to pain. She denies any vertigo, dizziness, blurred vision, double vision, difficulty swallowing.    Past Medical History  Diagnosis Date  . Hypertension   . Stroke   . Diabetes mellitus without complication   . Anemia   . Fibromyalgia   . Chronic pain   . Right sided weakness     Past Surgical History  Procedure Laterality Date  . Leg surgery Right 2007    Surgery x3  . Oophorectomy      Family History  Problem Relation Age of Onset  . Leukemia Brother   . Breast cancer Mother   . Breast cancer Maternal Grandmother     Social History:  reports that she has never smoked. She has never used smokeless tobacco. She reports that she does not drink alcohol or use illicit drugs.  Allergies  Allergen Reactions  . Morphine And Related Hives    MEDICATIONS:                                                                                                                     No current facility-administered medications for this encounter.   Current Outpatient Prescriptions  Medication Sig Dispense Refill  . amLODipine (NORVASC) 10 MG tablet Take 10 mg by mouth every morning.       Marland Kitchen atorvastatin (LIPITOR) 20 MG tablet Take 20 mg by mouth every  evening.       . ciprofloxacin (CIPRO) 500 MG  tablet Take 500 mg by mouth 2 (two) times daily.      . clonazePAM (KLONOPIN) 1 MG tablet Take 1 mg by mouth at bedtime.      . cyclobenzaprine (FLEXERIL) 5 MG tablet Take 1 tablet (5 mg total) by mouth 3 (three) times daily as needed for muscle spasms.  12 tablet  0  . DULoxetine (CYMBALTA) 60 MG capsule Take 60 mg by mouth every morning.       . furosemide (LASIX) 20 MG tablet Take 20 mg by mouth every morning.       Marland Kitchen. HYDROcodone-acetaminophen (NORCO/VICODIN) 5-325 MG per tablet Take 1 tablet by mouth every 6 (six) hours as needed for moderate pain.      Marland Kitchen. ketorolac (TORADOL) 10 MG tablet Take 1 tablet (10 mg total) by mouth every 6 (six) hours as needed.  20 tablet  0  . metoprolol succinate (TOPROL-XL) 25 MG 24 hr tablet Take 25 mg by mouth every morning.       . Vitamin D, Ergocalciferol, (DRISDOL) 50000 UNITS CAPS capsule Take 50,000 Units by mouth every 7 (seven) days.      Marland Kitchen. zolpidem (AMBIEN) 10 MG tablet Take 10 mg by mouth at bedtime as needed for sleep.      Marland Kitchen. albuterol (PROVENTIL HFA;VENTOLIN HFA) 108 (90 BASE) MCG/ACT inhaler Inhale 2 puffs into the lungs every 6 (six) hours as needed for wheezing.      . beclomethasone (QVAR) 80 MCG/ACT inhaler Inhale 1 puff into the lungs 2 (two) times daily as needed (for wheezing).           ROS:                                                                                                                                       History obtained from the patient  General ROS: negative for - chills, fatigue, fever, night sweats, weight gain or weight loss Psychological ROS: negative for - behavioral disorder, hallucinations, memory difficulties, mood swings or suicidal ideation Ophthalmic ROS: negative for - blurry vision, double vision, eye pain or loss of vision ENT ROS: negative for - epistaxis, nasal discharge, oral lesions, sore throat, tinnitus or vertigo Allergy and Immunology ROS: negative  for - hives or itchy/watery eyes Hematological and Lymphatic ROS: negative for - bleeding problems, bruising or swollen lymph nodes Endocrine ROS: negative for - galactorrhea, hair pattern changes, polydipsia/polyuria or temperature intolerance Respiratory ROS: negative for - cough, hemoptysis, shortness of breath or wheezing Cardiovascular ROS: negative for - chest pain, dyspnea on exertion, edema or irregular heartbeat Gastrointestinal ROS: negative for - abdominal pain, diarrhea, hematemesis, nausea/vomiting or stool incontinence Genito-Urinary ROS: negative for - dysuria, hematuria, incontinence or urinary frequency/urgency Musculoskeletal ROS: negative for - joint swelling or muscular weakness Neurological ROS: as noted in HPI Dermatological ROS: negative for rash and skin lesion changes   Blood pressure 139/106, pulse 97,  temperature 97.9 F (36.6 C), temperature source Oral, resp. rate 19, height 5\' 7"  (1.702 m), weight 142.883 kg (315 lb), SpO2 100.00%.   Neurologic Examination:                                                                                                      General: NAD Mental Status: Alert, oriented, thought content appropriate.  Speech stuttering in quality but fluent without evidence of aphasia.  Able to follow 3 step commands without difficulty.  Cranial Nerves: II: Discs flat bilaterally; Visual fields grossly normal, pupils equal, round, reactive to light and accommodation III,IV, VI: ptosis not present, extra-ocular motions intact bilaterally V,VII: smile symmetric, facial light touch sensation normal bilaterally VIII: hearing normal bilaterally IX,X: gag reflex present XI: bilateral shoulder shrug XII: midline tongue extension without atrophy or fasciculations  Motor: Right : Upper extremity   5/5    Left:     Upper extremity   5/5  Lower extremity   5/5     Lower extremity   5/5 --LE exam is limited due to complaints of proximal thigh pain Tone  and bulk:normal tone throughout; no atrophy noted Sensory: Pinprick and light touch intact throughout, bilaterally Deep Tendon Reflexes:  1+ bilateral UE and no KJ or AJ  Plantars: Right: downgoing   Left: downgoing Cerebellar: normal finger-to-nose,  normal heel-to-shin test Gait: not able to test.  She sat at edge of bed and when trying to stand she could not due to pain.  CV: pulses palpable throughout    Lab Results: Basic Metabolic Panel:  Recent Labs Lab 04/16/14 1100  NA 142  K 3.5*  CL 103  CO2 25  GLUCOSE 127*  BUN 20  CREATININE 1.26*  CALCIUM 9.2    Liver Function Tests:  Recent Labs Lab 04/16/14 1100  AST 15  ALT 15  ALKPHOS 70  BILITOT 0.2*  PROT 7.4  ALBUMIN 3.4*   No results found for this basename: LIPASE, AMYLASE,  in the last 168 hours No results found for this basename: AMMONIA,  in the last 168 hours  CBC:  Recent Labs Lab 04/16/14 1100  WBC 6.8  NEUTROABS 3.7  HGB 11.6*  HCT 35.4*  MCV 87.2  PLT 276    Cardiac Enzymes: No results found for this basename: CKTOTAL, CKMB, CKMBINDEX, TROPONINI,  in the last 168 hours  Lipid Panel: No results found for this basename: CHOL, TRIG, HDL, CHOLHDL, VLDL, LDLCALC,  in the last 168 hours  CBG:  Recent Labs Lab 04/16/14 1221  GLUCAP 122*    Microbiology: Results for orders placed during the hospital encounter of 04/11/13  URINE CULTURE     Status: None   Collection Time    04/11/13  7:05 AM      Result Value Ref Range Status   Specimen Description URINE, CATHETERIZED   Final   Special Requests NONE   Final   Culture  Setup Time     Final   Value: 04/11/2013 14:09     Performed at [facility] Count  Final   Value: NO GROWTH     Performed at [facility]   Culture     Final   Value: NO GROWTH     Performed at [facility]   Report Status 04/12/2013 FINAL   Final  CULTURE, BLOOD (ROUTINE X 2)     Status: None   Collection Time     04/11/13 10:00 AM      Result Value Ref Range Status   Specimen Description BLOOD LEFT ANTECUBITAL   Final   Special Requests BOTTLES DRAWN AEROBIC AND ANAEROBIC   Final   Culture  Setup Time     Final   Value: 04/11/2013 14:05     Performed at [facility]   Culture     Final   Value: NO GROWTH 5 DAYS     Performed at [facility]   Report Status 04/17/2013 FINAL   Final  CULTURE, BLOOD (ROUTINE X 2)     Status: None   Collection Time    04/11/13 10:05 AM      Result Value Ref Range Status   Specimen Description BLOOD RIGHT ANTECUBITAL   Final   Special Requests BOTTLES DRAWN AEROBIC AND ANAEROBIC   Final   Culture  Setup Time     Final   Value: 04/11/2013 14:05     Performed at [facility]   Culture     Final   Value: NO GROWTH 5 DAYS     Performed at [facility]   Report Status 04/17/2013 FINAL   Final    Coagulation Studies:  Recent Labs  04/16/14 1300  LABPROT 14.2  INR 1.10    Imaging: Ct Head (brain) Wo Contrast  04/16/2014   CLINICAL DATA:  Slurred speech x3 days.  Difficulty with balance.  EXAM: CT HEAD WITHOUT CONTRAST  TECHNIQUE: Contiguous axial images were obtained from the base of the skull through the vertex without intravenous contrast.  COMPARISON:  04/11/2013 .  FINDINGS: No intra-axial or extra-axial pathologic fluid or blood collection. Periventricular white matter changes of chronic ischemic again noted. No mass lesion. No hydrocephalus. No acute bony abnormality. Mucosal thickening noted of the maxillary sinuses.  IMPRESSION: Periventricular white matter changes again noted consistent with chronic ischemia.   Electronically Signed   By: Maisie Fus  Register   On: 04/16/2014 13:20    Assessment and plan per attending neurologist  Felicie Morn PA-C [facility] [phone]  04/16/2014, 3:15 PM   Assessment/Plan: 53 year old female with a history of difficulty walking secondary to pain in both knees as  well as stuttering speech. The stuttering speech I suspect to be nonphysiologic. It is not clear to me that the difficulty walking is related to neurological disease. If her MRI is negative, then I would not pursue further workup.  1) MRI brain, if negative, neurology will sign off.   Ritta Slot, MD [facility] [phone]  If 7pm- 7am, please page neurology on call as listed in [facility].

## 2014-04-16 NOTE — ED Notes (Signed)
Neuro PA at bedside.  

## 2014-04-16 NOTE — ED Notes (Signed)
Pt undressed, in gown, on monitor, continuous pulse oximetry and blood pressure cuff; family at bedside 

## 2014-04-16 NOTE — ED Notes (Signed)
Pt has returned from being out of the department; pt placed back on monitor, continuous pulse oximetry and blood pressure cuff 

## 2014-04-16 NOTE — Discharge Instructions (Signed)
Fibromyalgia Fibromyalgia is a disorder that is often misunderstood. It is associated with muscular pains and tenderness that comes and goes. It is often associated with fatigue and sleep disturbances. Though it tends to be long-lasting, fibromyalgia is not life-threatening. CAUSES  The exact cause of fibromyalgia is unknown. People with certain gene types are predisposed to developing fibromyalgia and other conditions. Certain factors can play a role as triggers, such as:  Spine disorders.  Arthritis.  Severe injury (trauma) and other physical stressors.  Emotional stressors. SYMPTOMS   The main symptom is pain and stiffness in the muscles and joints, which can vary over time.  Sleep and fatigue problems. Other related symptoms may include:  Bowel and bladder problems.  Headaches.  Visual problems.  Problems with odors and noises.  Depression or mood changes.  Painful periods (dysmenorrhea).  Dryness of the skin or eyes. DIAGNOSIS  There are no specific tests for diagnosing fibromyalgia. Patients can be diagnosed accurately from the specific symptoms they have. The diagnosis is made by determining that nothing else is causing the problems. TREATMENT  There is no cure. Management includes medicines and an active, healthy lifestyle. The goal is to enhance physical fitness, decrease pain, and improve sleep. HOME CARE INSTRUCTIONS   Only take over-the-counter or prescription medicines as directed by your caregiver. Sleeping pills, tranquilizers, and pain medicines may make your problems worse.  Low-impact aerobic exercise is very important and advised for treatment. At first, it may seem to make pain worse. Gradually increasing your tolerance will overcome this feeling.  Learning relaxation techniques and how to control stress will help you. Biofeedback, visual imagery, hypnosis, muscle relaxation, yoga, and meditation are all options.  Anti-inflammatory medicines and  physical therapy may provide short-term help.  Acupuncture or massage treatments may help.  Take muscle relaxant medicines as suggested by your caregiver.  Avoid stressful situations.  Plan a healthy lifestyle. This includes your diet, sleep, rest, exercise, and friends.  Find and practice a hobby you enjoy.  Join a fibromyalgia support group for interaction, ideas, and sharing advice. This may be helpful. SEEK MEDICAL CARE IF:  You are not having good results or improvement from your treatment. FOR MORE INFORMATION  National Fibromyalgia Association: www.fmaware.org Arthritis Foundation: www.arthritis.org Document Released: 06/28/2005 Document Revised: 09/20/2011 Document Reviewed: 10/08/2009 ExitCare Patient Information 2015 [employer]. This information is not intended to replace advice given to you by your health care provider. Make sure you discuss any questions you have with your health care provider.  

## 2014-04-16 NOTE — ED Notes (Signed)
Pt c/o slurred speech x 3 days; pt with hx of TIA; pt sts feels off balance when walking; pt sts some dizziness also; no obvious neuro deficits noted

## 2014-04-16 NOTE — ED Notes (Signed)
Patient transported to CT 

## 2014-04-16 NOTE — ED Notes (Signed)
MD at bedside. 

## 2014-05-15 ENCOUNTER — Emergency Department (HOSPITAL_COMMUNITY): Payer: Medicaid Other

## 2014-05-15 ENCOUNTER — Encounter (HOSPITAL_COMMUNITY): Payer: Self-pay | Admitting: *Deleted

## 2014-05-15 ENCOUNTER — Emergency Department (HOSPITAL_COMMUNITY)
Admission: EM | Admit: 2014-05-15 | Discharge: 2014-05-16 | Disposition: A | Discharge status: Home / Self Care | Payer: Medicaid Other | Attending: Emergency Medicine | Admitting: Emergency Medicine

## 2014-05-15 DIAGNOSIS — R4182 Altered mental status, unspecified: Secondary | ICD-10-CM | POA: Diagnosis present

## 2014-05-15 DIAGNOSIS — M797 Fibromyalgia: Secondary | ICD-10-CM | POA: Insufficient documentation

## 2014-05-15 DIAGNOSIS — G8929 Other chronic pain: Secondary | ICD-10-CM | POA: Diagnosis not present

## 2014-05-15 DIAGNOSIS — Z8673 Personal history of transient ischemic attack (TIA), and cerebral infarction without residual deficits: Secondary | ICD-10-CM | POA: Insufficient documentation

## 2014-05-15 DIAGNOSIS — E119 Type 2 diabetes mellitus without complications: Secondary | ICD-10-CM | POA: Diagnosis not present

## 2014-05-15 DIAGNOSIS — I10 Essential (primary) hypertension: Secondary | ICD-10-CM | POA: Diagnosis not present

## 2014-05-15 DIAGNOSIS — R079 Chest pain, unspecified: Secondary | ICD-10-CM | POA: Diagnosis not present

## 2014-05-15 DIAGNOSIS — Z862 Personal history of diseases of the blood and blood-forming organs and certain disorders involving the immune mechanism: Secondary | ICD-10-CM | POA: Insufficient documentation

## 2014-05-15 DIAGNOSIS — R42 Dizziness and giddiness: Secondary | ICD-10-CM

## 2014-05-15 DIAGNOSIS — Z792 Long term (current) use of antibiotics: Secondary | ICD-10-CM | POA: Insufficient documentation

## 2014-05-15 DIAGNOSIS — Z79899 Other long term (current) drug therapy: Secondary | ICD-10-CM | POA: Diagnosis not present

## 2014-05-15 DIAGNOSIS — R270 Ataxia, unspecified: Secondary | ICD-10-CM | POA: Insufficient documentation

## 2014-05-15 DIAGNOSIS — R52 Pain, unspecified: Secondary | ICD-10-CM

## 2014-05-15 LAB — CBC WITH DIFFERENTIAL/PLATELET
BASOS ABS: 0 10*3/uL (ref 0.0–0.1)
Basophils Relative: 1 % (ref 0–1)
EOS ABS: 0.2 10*3/uL (ref 0.0–0.7)
EOS PCT: 2 % (ref 0–5)
HCT: 37.5 % (ref 36.0–46.0)
Hemoglobin: 12.4 g/dL (ref 12.0–15.0)
LYMPHS PCT: 30 % (ref 12–46)
Lymphs Abs: 2.6 10*3/uL (ref 0.7–4.0)
MCH: 28.5 pg (ref 26.0–34.0)
MCHC: 33.1 g/dL (ref 30.0–36.0)
MCV: 86.2 fL (ref 78.0–100.0)
Monocytes Absolute: 0.5 10*3/uL (ref 0.1–1.0)
Monocytes Relative: 6 % (ref 3–12)
Neutro Abs: 5.3 10*3/uL (ref 1.7–7.7)
Neutrophils Relative %: 61 % (ref 43–77)
Platelets: 305 10*3/uL (ref 150–400)
RBC: 4.35 MIL/uL (ref 3.87–5.11)
RDW: 13.8 % (ref 11.5–15.5)
WBC: 8.6 10*3/uL (ref 4.0–10.5)

## 2014-05-15 LAB — COMPREHENSIVE METABOLIC PANEL
ALT: 24 U/L (ref 0–35)
AST: 19 U/L (ref 0–37)
Albumin: 3.7 g/dL (ref 3.5–5.2)
Alkaline Phosphatase: 83 U/L (ref 39–117)
Anion gap: 14 (ref 5–15)
BUN: 16 mg/dL (ref 6–23)
CALCIUM: 9.2 mg/dL (ref 8.4–10.5)
CO2: 27 mEq/L (ref 19–32)
Chloride: 104 mEq/L (ref 96–112)
Creatinine, Ser: 1 mg/dL (ref 0.50–1.10)
GFR calc Af Amer: 74 mL/min — ABNORMAL LOW (ref >90)
GFR calc non Af Amer: 64 mL/min — ABNORMAL LOW (ref >90)
Glucose, Bld: 129 mg/dL — ABNORMAL HIGH (ref 70–99)
Potassium: 3.4 mEq/L — ABNORMAL LOW (ref 3.7–5.3)
SODIUM: 145 meq/L (ref 137–147)
Total Bilirubin: 0.3 mg/dL (ref 0.3–1.2)
Total Protein: 7.8 g/dL (ref 6.0–8.3)

## 2014-05-15 MED ORDER — FENTANYL CITRATE 0.05 MG/ML IJ SOLN
100.0000 ug | Freq: Once | INTRAMUSCULAR | Status: AC
  Administered 2014-05-15: 100 ug via INTRAVENOUS
  Filled 2014-05-15: qty 2

## 2014-05-15 MED ORDER — SODIUM CHLORIDE 0.9 % IV BOLUS (SEPSIS)
1000.0000 mL | Freq: Once | INTRAVENOUS | Status: AC
  Administered 2014-05-15: 1000 mL via INTRAVENOUS

## 2014-05-15 NOTE — ED Notes (Signed)
Dr. Campos at bedside   

## 2014-05-15 NOTE — ED Notes (Signed)
Pt arrived by [facility], pt reports not feeling well x 2 days. Having fatigue, weakness, blurred vision and difficulty ambulating. Pt reports pain to her entire body, hx of fibromyalgia. No acute distress noted and ekg done at triage.

## 2014-05-16 LAB — URINALYSIS, ROUTINE W REFLEX MICROSCOPIC
Bilirubin Urine: NEGATIVE
Glucose, UA: NEGATIVE mg/dL
Ketones, ur: NEGATIVE mg/dL
Leukocytes, UA: NEGATIVE
Nitrite: NEGATIVE
PH: 5.5 (ref 5.0–8.0)
Protein, ur: NEGATIVE mg/dL
Specific Gravity, Urine: 1.028 (ref 1.005–1.030)
Urobilinogen, UA: 0.2 mg/dL (ref 0.0–1.0)

## 2014-05-16 LAB — URINE MICROSCOPIC-ADD ON

## 2014-05-16 NOTE — ED Provider Notes (Signed)
CSN: [id number]     Arrival date & time 05/15/14  1838 History   First MD Initiated Contact with Patient 05/15/14 2300     Chief Complaint  Patient presents with  . Altered Mental Status  . Chest Pain     HPI Patient presents with diffuse pain all over and overall not feeling well.  She states fatigue and generalized weakness.  She reports some blurred vision bilaterally as well as some difficulty ambulating and mild lightheadedness.  She denies chest pain or shortness of breath.  No nausea or vomiting.  No diarrhea.  No fevers or chills.  No urinary complaints.   Past Medical History  Diagnosis Date  . Hypertension   . Stroke   . Diabetes mellitus without complication   . Anemia   . Fibromyalgia   . Chronic pain   . Right sided weakness    Past Surgical History  Procedure Laterality Date  . Leg surgery Right 2007    Surgery x3  . Oophorectomy     Family History  Problem Relation Age of Onset  . Leukemia Brother   . Breast cancer Mother   . Breast cancer Maternal Grandmother    History  Substance Use Topics  . Smoking status: Never Smoker   . Smokeless tobacco: Never Used  . Alcohol Use: No   OB History    No data available     Review of Systems  All other systems reviewed and are negative.     Allergies  Morphine and related  Home Medications   Prior to Admission medications   Medication Sig Start Date End Date Taking? Authorizing Provider  albuterol (PROVENTIL HFA;VENTOLIN HFA) 108 (90 BASE) MCG/ACT inhaler Inhale 2 puffs into the lungs every 6 (six) hours as needed for wheezing.   Yes Historical Provider, MD  amLODipine (NORVASC) 10 MG tablet Take 10 mg by mouth every morning.    Yes Historical Provider, MD  beclomethasone (QVAR) 80 MCG/ACT inhaler Inhale 1 puff into the lungs 2 (two) times daily as needed (for wheezing).    Yes Historical Provider, MD  clonazePAM (KLONOPIN) 1 MG tablet Take 1 mg by mouth at bedtime.   Yes Historical Provider, MD   DULoxetine (CYMBALTA) 60 MG capsule Take 60 mg by mouth every morning.    Yes Historical Provider, MD  furosemide (LASIX) 20 MG tablet Take 20 mg by mouth every morning.    Yes Historical Provider, MD  Vitamin D, Ergocalciferol, (DRISDOL) 50000 UNITS CAPS capsule Take 50,000 Units by mouth every 7 (seven) days.   Yes Historical Provider, MD  zolpidem (AMBIEN) 10 MG tablet Take 10 mg by mouth at bedtime as needed for sleep.   Yes Historical Provider, MD  atorvastatin (LIPITOR) 20 MG tablet Take 20 mg by mouth every evening.     Historical Provider, MD  ciprofloxacin (CIPRO) 500 MG tablet Take 500 mg by mouth 2 (two) times daily.    Historical Provider, MD  cyclobenzaprine (FLEXERIL) 5 MG tablet Take 1 tablet (5 mg total) by mouth 3 (three) times daily as needed for muscle spasms. 09/03/13   Lyanne CoKevin M Licet Dunphy, MD  HYDROcodone-acetaminophen (NORCO/VICODIN) 5-325 MG per tablet Take 1 tablet by mouth every 6 (six) hours as needed for moderate pain.    Historical Provider, MD  ketorolac (TORADOL) 10 MG tablet Take 1 tablet (10 mg total) by mouth every 6 (six) hours as needed. 11/21/13   Arthor CaptainAbigail Harris, PA-C  metoprolol succinate (TOPROL-XL) 25 MG 24 hr tablet  Take 25 mg by mouth every morning.     Historical Provider, MD   BP 117/60 mmHg  Pulse 91  Temp(Src) 98.5 F (36.9 C) (Oral)  Resp 21  Ht 5\' 7"  (1.702 m)  Wt 310 lb (140.615 kg)  BMI 48.54 kg/m2  SpO2 97% Physical Exam  Constitutional: She is oriented to person, place, and time. She appears well-developed and well-nourished. No distress.  HENT:  Head: Normocephalic and atraumatic.  Eyes: EOM are normal. Pupils are equal, round, and reactive to light.  Neck: Normal range of motion.  Cardiovascular: Normal rate, regular rhythm and normal heart sounds.   Pulmonary/Chest: Effort normal and breath sounds normal.  Abdominal: Soft. She exhibits no distension. There is no tenderness.  Musculoskeletal: Normal range of motion.  Neurological: She is  alert and oriented to person, place, and time.  5/5 strength in major muscle groups of  bilateral upper and lower extremities. Speech normal. No facial asymetry.   Skin: Skin is warm and dry.  Psychiatric: She has a normal mood and affect. Judgment normal.  Nursing note and vitals reviewed.   ED Course  Procedures (including critical care time) Labs Review Labs Reviewed  COMPREHENSIVE METABOLIC PANEL - Abnormal; Notable for the following:    Potassium 3.4 (*)    Glucose, Bld 129 (*)    GFR calc non Af Amer 64 (*)    GFR calc Af Amer 74 (*)    All other components within normal limits  URINALYSIS, ROUTINE W REFLEX MICROSCOPIC - Abnormal; Notable for the following:    APPearance CLOUDY (*)    Hgb urine dipstick SMALL (*)    All other components within normal limits  URINE MICROSCOPIC-ADD ON - Abnormal; Notable for the following:    Squamous Epithelial / LPF MANY (*)    Bacteria, UA FEW (*)    Casts HYALINE CASTS (*)    All other components within normal limits  CBC WITH DIFFERENTIAL    Imaging Review Ct Head Wo Contrast  05/15/2014   CLINICAL DATA:  Fatigue.  Weakness.  Blurred vision.  Ataxia.  EXAM: CT HEAD WITHOUT CONTRAST  TECHNIQUE: Contiguous axial images were obtained from the base of the skull through the vertex without intravenous contrast.  COMPARISON:  MRI brain 04/16/2014.  CT head 04/16/2014.  FINDINGS: Low-attenuation changes in the deep white matter consistent with chronic white matter disease. This could be due to ischemia or other demyelinating/ dysmyelinating process is. No change since prior study. No mass effect or midline shift. No abnormal extra-axial fluid collections. Gray-white matter junctions are distinct. Basal cisterns are not effaced. No evidence of acute intracranial hemorrhage. No depressed skull fractures. Mucosal thickening in the paranasal sinuses.  IMPRESSION: Diffuse nonspecific white matter disease similar to previous studies. No acute intracranial  abnormalities.   Electronically Signed   By: Burman Nieves M.D.   On: 05/15/2014 23:57     EKG Interpretation None      MDM   Final diagnoses:  Ataxia  Diffuse pain  Lightheaded    Labs and imaging without significantly abnormality.  The patient is ambulatory in the emergency department.  Discharge home with PCP follow-up.  Unclear etiology.  She states that her major complaints is diffuse pain all over and she states a history of fibromyalgia.  She states her primary care doctor has only prescribe Cymbalta.  She states that the fentanyl helped a lot and would like a prescription for fentanyl to go home.  I told her to  discuss ongoing pain needs with her primary care physician.    Lyanne CoKevin M Jermichael Belmares, MD 05/16/14 [phone]

## 2014-05-16 NOTE — ED Notes (Signed)
Pt a/o x 4 on d/c, ambulating with steady gait while using walker.

## 2014-06-16 ENCOUNTER — Emergency Department (HOSPITAL_COMMUNITY)
Admission: EM | Admit: 2014-06-16 | Discharge: 2014-06-16 | Disposition: A | Discharge status: Home / Self Care | Payer: Medicaid Other | Attending: Emergency Medicine | Admitting: Emergency Medicine

## 2014-06-16 ENCOUNTER — Other Ambulatory Visit: Payer: Self-pay

## 2014-06-16 ENCOUNTER — Encounter (HOSPITAL_COMMUNITY): Payer: Self-pay

## 2014-06-16 DIAGNOSIS — E119 Type 2 diabetes mellitus without complications: Secondary | ICD-10-CM | POA: Insufficient documentation

## 2014-06-16 DIAGNOSIS — Z79899 Other long term (current) drug therapy: Secondary | ICD-10-CM | POA: Insufficient documentation

## 2014-06-16 DIAGNOSIS — Z862 Personal history of diseases of the blood and blood-forming organs and certain disorders involving the immune mechanism: Secondary | ICD-10-CM | POA: Insufficient documentation

## 2014-06-16 DIAGNOSIS — R000 Tachycardia, unspecified: Secondary | ICD-10-CM | POA: Insufficient documentation

## 2014-06-16 DIAGNOSIS — Z792 Long term (current) use of antibiotics: Secondary | ICD-10-CM | POA: Diagnosis not present

## 2014-06-16 DIAGNOSIS — I10 Essential (primary) hypertension: Secondary | ICD-10-CM | POA: Insufficient documentation

## 2014-06-16 DIAGNOSIS — G8929 Other chronic pain: Secondary | ICD-10-CM | POA: Diagnosis not present

## 2014-06-16 DIAGNOSIS — M545 Low back pain: Secondary | ICD-10-CM | POA: Diagnosis present

## 2014-06-16 DIAGNOSIS — B349 Viral infection, unspecified: Secondary | ICD-10-CM | POA: Insufficient documentation

## 2014-06-16 DIAGNOSIS — M797 Fibromyalgia: Secondary | ICD-10-CM | POA: Insufficient documentation

## 2014-06-16 DIAGNOSIS — Z8673 Personal history of transient ischemic attack (TIA), and cerebral infarction without residual deficits: Secondary | ICD-10-CM | POA: Diagnosis not present

## 2014-06-16 LAB — RAPID URINE DRUG SCREEN, HOSP PERFORMED
Amphetamines: NOT DETECTED
BENZODIAZEPINES: NOT DETECTED
Barbiturates: NOT DETECTED
COCAINE: NOT DETECTED
OPIATES: NOT DETECTED
Tetrahydrocannabinol: NOT DETECTED

## 2014-06-16 LAB — CBC WITH DIFFERENTIAL/PLATELET
Basophils Absolute: 0 10*3/uL (ref 0.0–0.1)
Basophils Relative: 1 % (ref 0–1)
EOS PCT: 1 % (ref 0–5)
Eosinophils Absolute: 0.1 10*3/uL (ref 0.0–0.7)
HCT: 34.1 % — ABNORMAL LOW (ref 36.0–46.0)
Hemoglobin: 11.1 g/dL — ABNORMAL LOW (ref 12.0–15.0)
LYMPHS ABS: 2.3 10*3/uL (ref 0.7–4.0)
Lymphocytes Relative: 30 % (ref 12–46)
MCH: 28.8 pg (ref 26.0–34.0)
MCHC: 32.6 g/dL (ref 30.0–36.0)
MCV: 88.6 fL (ref 78.0–100.0)
MONO ABS: 0.5 10*3/uL (ref 0.1–1.0)
MONOS PCT: 6 % (ref 3–12)
Neutro Abs: 4.6 10*3/uL (ref 1.7–7.7)
Neutrophils Relative %: 62 % (ref 43–77)
Platelets: 289 10*3/uL (ref 150–400)
RBC: 3.85 MIL/uL — AB (ref 3.87–5.11)
RDW: 14.7 % (ref 11.5–15.5)
WBC: 7.6 10*3/uL (ref 4.0–10.5)

## 2014-06-16 LAB — URINE MICROSCOPIC-ADD ON

## 2014-06-16 LAB — URINALYSIS, ROUTINE W REFLEX MICROSCOPIC
BILIRUBIN URINE: NEGATIVE
Glucose, UA: NEGATIVE mg/dL
Ketones, ur: NEGATIVE mg/dL
Leukocytes, UA: NEGATIVE
Nitrite: NEGATIVE
Protein, ur: NEGATIVE mg/dL
Specific Gravity, Urine: 1.029 (ref 1.005–1.030)
UROBILINOGEN UA: 0.2 mg/dL (ref 0.0–1.0)
pH: 5 (ref 5.0–8.0)

## 2014-06-16 LAB — COMPREHENSIVE METABOLIC PANEL
ALT: 19 U/L (ref 0–35)
AST: 14 U/L (ref 0–37)
Albumin: 3.4 g/dL — ABNORMAL LOW (ref 3.5–5.2)
Alkaline Phosphatase: 65 U/L (ref 39–117)
Anion gap: 11 (ref 5–15)
BUN: 21 mg/dL (ref 6–23)
CALCIUM: 8.7 mg/dL (ref 8.4–10.5)
CO2: 25 mEq/L (ref 19–32)
CREATININE: 1.09 mg/dL (ref 0.50–1.10)
Chloride: 109 mEq/L (ref 96–112)
GFR calc non Af Amer: 57 mL/min — ABNORMAL LOW (ref >90)
GFR, EST AFRICAN AMERICAN: 66 mL/min — AB (ref >90)
GLUCOSE: 139 mg/dL — AB (ref 70–99)
Potassium: 4 mEq/L (ref 3.7–5.3)
Sodium: 145 mEq/L (ref 137–147)
TOTAL PROTEIN: 6.9 g/dL (ref 6.0–8.3)
Total Bilirubin: <0.2 mg/dL — ABNORMAL LOW (ref 0.3–1.2)

## 2014-06-16 LAB — I-STAT TROPONIN, ED: TROPONIN I, POC: 0 ng/mL (ref 0.00–0.08)

## 2014-06-16 LAB — LIPASE, BLOOD: Lipase: 73 U/L — ABNORMAL HIGH (ref 11–59)

## 2014-06-16 LAB — RAPID STREP SCREEN (MED CTR MEBANE ONLY): Streptococcus, Group A Screen (Direct): NEGATIVE

## 2014-06-16 MED ORDER — ONDANSETRON HCL 4 MG PO TABS: 4.0000 mg | ORAL_TABLET | Freq: Four times a day (QID) | ORAL | Status: DC

## 2014-06-16 MED ORDER — SODIUM CHLORIDE 0.9 % IV BOLUS (SEPSIS)
1000.0000 mL | Freq: Once | INTRAVENOUS | Status: AC
  Administered 2014-06-16 (×2): 1000 mL via INTRAVENOUS

## 2014-06-16 MED ORDER — ONDANSETRON HCL 4 MG/2ML IJ SOLN
4.0000 mg | Freq: Once | INTRAMUSCULAR | Status: AC
  Administered 2014-06-16: 4 mg via INTRAVENOUS
  Filled 2014-06-16: qty 2

## 2014-06-16 MED ORDER — ACETAMINOPHEN 325 MG PO TABS
650.0000 mg | ORAL_TABLET | Freq: Once | ORAL | Status: AC
  Administered 2014-06-16: 650 mg via ORAL
  Filled 2014-06-16: qty 2

## 2014-06-16 MED ORDER — SODIUM CHLORIDE 0.9 % IV BOLUS (SEPSIS)
1000.0000 mL | Freq: Once | INTRAVENOUS | Status: AC
Start: 2014-06-16 — End: 2014-06-16
  Administered 2014-06-16: 1000 mL via INTRAVENOUS

## 2014-06-16 NOTE — ED Notes (Addendum)
Ambulated patient in hall. Pt ambulated with assistance to the end of the hall way, by the time we got to the end of the hall patient stated that her legs were tired and she became unstable, tech assisted this RN to get patient back to room and in bed.

## 2014-06-16 NOTE — ED Notes (Signed)
Lab called to follow up with lab results again

## 2014-06-16 NOTE — ED Provider Notes (Signed)
Complains of diffuse myalgias sore throat with slight discomfort with swallowing onset yesterday also complains of feeling unsteady on her feet and generally weak patient vomited twice on exam patient is in no acute distress oral pharynx mildly reddened. Lungs clear auscultation heart regular rate and rhythm abdomen morbidly obese nontender extremities without edema gait is unsteady, becomes generally weak with standing from supine position  Doug SouSam Nadeem Romanoski, MD 06/16/14 1643

## 2014-06-16 NOTE — Discharge Instructions (Signed)

## 2014-06-16 NOTE — ED Notes (Signed)
Ed White updated [place] on delaying labs

## 2014-06-16 NOTE — ED Notes (Signed)
Pt woke up with sore throat this morning. Pt became weak last night and having hallucinations. Pt has been "wobbling, staggering, and misplacing things" last night. Pt is on meds for fibromyalgia and high blood pressure.

## 2014-06-16 NOTE — ED Provider Notes (Signed)
CSN: [id number]     Arrival date & time 06/16/14  [id number] History   First MD Initiated Contact with Patient 06/16/14 0857     No chief complaint on file.    (Consider location/radiation/quality/duration/timing/severity/associated sxs/prior Treatment) HPI   53 year old female with history of fibromyalgia, chronic pain, non-insulin-dependent diabetes who presents with multiple complaints. Patient states she has not been feeling well for the past several days. She complains of feeling unsteady on her feet nearly falling several times. She also complaining of having sore throat worse with talking and eating. She reported having pain to her lower back that is similar to her chronic back pain but worse. She reports feeling restless. Last night she has some visual hallucination with seeing children running around the room. Her husband has to reassured that there were no children. Patient denies any recent medication changes. No complaints of fever or chills, no chest pain shortness of breath or productive cough, no abdominal pain nausea vomiting or diarrhea. She denies any dysuria. Denies any alcohol abuse or recreational drug use. Patient is on medication for fibromyalgia as high blood pressure. States that she has been taking her medication as prescribed. Denies any prior history of visual hallucination. Denies depression, SI or HI.  Past Medical History  Diagnosis Date  . Hypertension   . Stroke   . Diabetes mellitus without complication   . Anemia   . Fibromyalgia   . Chronic pain   . Right sided weakness    Past Surgical History  Procedure Laterality Date  . Leg surgery Right 2007    Surgery x3  . Oophorectomy     Family History  Problem Relation Age of Onset  . Leukemia Brother   . Breast cancer Mother   . Breast cancer Maternal Grandmother    History  Substance Use Topics  . Smoking status: Never Smoker   . Smokeless tobacco: Never Used  . Alcohol Use: No   OB History    No data  available     Review of Systems  All other systems reviewed and are negative.     Allergies  Morphine and related  Home Medications   Prior to Admission medications   Medication Sig Start Date End Date Taking? Authorizing Provider  albuterol (PROVENTIL HFA;VENTOLIN HFA) 108 (90 BASE) MCG/ACT inhaler Inhale 2 puffs into the lungs every 6 (six) hours as needed for wheezing.   Yes Historical Provider, MD  amLODipine (NORVASC) 10 MG tablet Take 10 mg by mouth every morning.    Yes Historical Provider, MD  atorvastatin (LIPITOR) 20 MG tablet Take 20 mg by mouth every evening.    Yes Historical Provider, MD  metoprolol succinate (TOPROL-XL) 25 MG 24 hr tablet Take 25 mg by mouth every morning.    Yes Historical Provider, MD  Vitamin D, Ergocalciferol, (DRISDOL) 50000 UNITS CAPS capsule Take 50,000 Units by mouth every 7 (seven) days.   Yes Historical Provider, MD  zolpidem (AMBIEN) 10 MG tablet Take 10 mg by mouth at bedtime as needed for sleep.   Yes Historical Provider, MD  beclomethasone (QVAR) 80 MCG/ACT inhaler Inhale 1 puff into the lungs 2 (two) times daily as needed (for wheezing).     Historical Provider, MD  ciprofloxacin (CIPRO) 500 MG tablet Take 500 mg by mouth 2 (two) times daily.    Historical Provider, MD  clonazePAM (KLONOPIN) 1 MG tablet Take 1 mg by mouth at bedtime.    Historical Provider, MD  cyclobenzaprine (FLEXERIL) 5 MG tablet  Take 1 tablet (5 mg total) by mouth 3 (three) times daily as needed for muscle spasms. 09/03/13   Lyanne CoKevin M Campos, MD  DULoxetine (CYMBALTA) 60 MG capsule Take 60 mg by mouth every morning.     Historical Provider, MD  furosemide (LASIX) 20 MG tablet Take 20 mg by mouth every morning.     Historical Provider, MD  HYDROcodone-acetaminophen (NORCO/VICODIN) 5-325 MG per tablet Take 1 tablet by mouth every 6 (six) hours as needed for moderate pain.    Historical Provider, MD  ketorolac (TORADOL) 10 MG tablet Take 1 tablet (10 mg total) by mouth every 6  (six) hours as needed. 11/21/13   Abigail Harris, PA-C   BP 109/68 mmHg  Pulse 107  Temp(Src) 97.7 F (36.5 C) (Oral)  Resp 18  SpO2 97% Physical Exam  Constitutional: She is oriented to person, place, and time. She appears well-developed and well-nourished. No distress.  Morbidly obese female resting in bed, nontoxic in appearance  HENT:  Head: Atraumatic.  Throat: Uvula is midline, no tonsillar enlargement or exudates, no trismus.  Eyes: Conjunctivae and EOM are normal. Pupils are equal, round, and reactive to light.  Neck: Normal range of motion. Neck supple.  No nuchal rigidity  Cardiovascular:  Tachycardia without murmur rubs or gallops  Pulmonary/Chest: Effort normal and breath sounds normal. No respiratory distress. She has no wheezes. She exhibits no tenderness.  Abdominal: Soft. There is no tenderness.  Musculoskeletal: She exhibits tenderness (Tenderness to lumbar and paralumbar spinal muscles to palpation without focal point tenderness.). She exhibits no edema.  Neurological: She is alert and oriented to person, place, and time. She has normal strength. No cranial nerve deficit or sensory deficit. She displays a negative Romberg sign. Coordination and gait normal. GCS eye subscore is 4. GCS verbal subscore is 5. GCS motor subscore is 6.  Reflex Scores:      Patellar reflexes are 2+ on the right side and 2+ on the left side. Skin: No rash noted.  Psychiatric: She has a normal mood and affect.  Nursing note and vitals reviewed.   ED Course  Procedures (including critical care time)  9:40 AM Patient here with multiple complaints. She complained of sore throat, A strep test obtained at no obvious signs of infection noted on exam. She complained of low back pain. No specific injury to require advanced imaging. No compressive dysuria however I will obtain UA to rule out occult urinary tract infection. She mentioned of having confusion, visual hallucination, feeling unsteady on  her feet. No obvious focal neuro deficit on exam. Patient is able to ambulate. I will obtain orthostatic vital sign and will check basic labs along with the UDS. She is tachycardic however appears to be anxious. No chest pain or shortness of breath.  Patient had a recent head CT scan less than a month ago as well as a brain MRI done in October that shows no acute abnormalities.    Care discussed with Dr. Ethelda ChickJacubowitz  11:31 AM Suspect viral etiology.  Will fluid hydrates, treats her nausea and ensure she can ambulate before discharge.    1:05 PM Pt felt much better after treatment.  Able to ambulate without difficulty.  Likely stable for discharge.  She's not actively vomiting and can tolerates PO.  She does not have any hx of psychiatric disease.  The visual hallucination is a 1 time occurrence.  Husband agrees to keep a close eye on her to ensure no further psychosis.  2:49 PM Her labs are reassuring. Mild bump in lipase but no evidence of pancreatitis. She is able to tolerates by mouth, able to ambulate, and felt stable for discharge. She will follow-up closely with her primary care Dr. for further evaluation. Zofran prescribed for nausea as needed. Strict return precautions discussed. Patient voiced understanding and agrees with plan.  Labs Review Labs Reviewed  URINALYSIS, ROUTINE W REFLEX MICROSCOPIC - Abnormal; Notable for the following:    APPearance CLOUDY (*)    Hgb urine dipstick SMALL (*)    All other components within normal limits  CBC WITH DIFFERENTIAL - Abnormal; Notable for the following:    RBC 3.85 (*)    Hemoglobin 11.1 (*)    HCT 34.1 (*)    All other components within normal limits  COMPREHENSIVE METABOLIC PANEL - Abnormal; Notable for the following:    Glucose, Bld 139 (*)    Albumin 3.4 (*)    Total Bilirubin <0.2 (*)    GFR calc non Af Amer 57 (*)    GFR calc Af Amer 66 (*)    All other components within normal limits  URINE MICROSCOPIC-ADD ON - Abnormal;  Notable for the following:    Squamous Epithelial / LPF FEW (*)    All other components within normal limits  LIPASE, BLOOD - Abnormal; Notable for the following:    Lipase 73 (*)    All other components within normal limits  RAPID STREP SCREEN  CULTURE, GROUP A STREP  URINE RAPID DRUG SCREEN (HOSP PERFORMED)  I-STAT TROPOININ, ED    Imaging Review No results found.   EKG Interpretation None      Date: 06/16/2014  Rate: 109  Rhythm: sinus tachycardia  QRS Axis: normal  Intervals: normal  ST/T Wave abnormalities: early repolarization  Conduction Disutrbances:none  Narrative Interpretation:   Old EKG Reviewed: unchanged    MDM   Final diagnoses:  Viral syndrome    BP 124/83 mmHg  Pulse 103  Temp(Src) 97.7 F (36.5 C) (Oral)  Resp 18  SpO2 99%  I have reviewed nursing notes and vital signs. I reviewed available ER/hospitalization records thought the EMR     Fayrene Helper, PA-C 06/16/14 1450  Doug Sou, MD 06/16/14 [phone]

## 2014-06-18 LAB — CULTURE, GROUP A STREP

## 2014-09-24 ENCOUNTER — Emergency Department (HOSPITAL_COMMUNITY): Payer: Medicaid Other

## 2014-09-24 ENCOUNTER — Emergency Department (HOSPITAL_COMMUNITY)
Admission: EM | Admit: 2014-09-24 | Discharge: 2014-09-24 | Disposition: A | Discharge status: Home / Self Care | Payer: Medicaid Other | Attending: Emergency Medicine | Admitting: Emergency Medicine

## 2014-09-24 ENCOUNTER — Encounter (HOSPITAL_COMMUNITY): Payer: Self-pay | Admitting: Emergency Medicine

## 2014-09-24 DIAGNOSIS — R4701 Aphasia: Secondary | ICD-10-CM | POA: Diagnosis present

## 2014-09-24 DIAGNOSIS — M797 Fibromyalgia: Secondary | ICD-10-CM | POA: Diagnosis not present

## 2014-09-24 DIAGNOSIS — Z79899 Other long term (current) drug therapy: Secondary | ICD-10-CM | POA: Diagnosis not present

## 2014-09-24 DIAGNOSIS — R4789 Other speech disturbances: Secondary | ICD-10-CM | POA: Diagnosis not present

## 2014-09-24 DIAGNOSIS — E119 Type 2 diabetes mellitus without complications: Secondary | ICD-10-CM | POA: Insufficient documentation

## 2014-09-24 DIAGNOSIS — G8929 Other chronic pain: Secondary | ICD-10-CM | POA: Diagnosis not present

## 2014-09-24 DIAGNOSIS — R519 Headache, unspecified: Secondary | ICD-10-CM

## 2014-09-24 DIAGNOSIS — I10 Essential (primary) hypertension: Secondary | ICD-10-CM | POA: Insufficient documentation

## 2014-09-24 DIAGNOSIS — R51 Headache: Secondary | ICD-10-CM | POA: Insufficient documentation

## 2014-09-24 DIAGNOSIS — D649 Anemia, unspecified: Secondary | ICD-10-CM | POA: Insufficient documentation

## 2014-09-24 DIAGNOSIS — Z8673 Personal history of transient ischemic attack (TIA), and cerebral infarction without residual deficits: Secondary | ICD-10-CM | POA: Diagnosis not present

## 2014-09-24 LAB — CBC
HCT: 39.3 % (ref 36.0–46.0)
Hemoglobin: 12.8 g/dL (ref 12.0–15.0)
MCH: 28.4 pg (ref 26.0–34.0)
MCHC: 32.6 g/dL (ref 30.0–36.0)
MCV: 87.3 fL (ref 78.0–100.0)
Platelets: 332 10*3/uL (ref 150–400)
RBC: 4.5 MIL/uL (ref 3.87–5.11)
RDW: 14.2 % (ref 11.5–15.5)
WBC: 8.7 10*3/uL (ref 4.0–10.5)

## 2014-09-24 LAB — DIFFERENTIAL
BASOS ABS: 0.1 10*3/uL (ref 0.0–0.1)
BASOS PCT: 1 % (ref 0–1)
Eosinophils Absolute: 0.2 10*3/uL (ref 0.0–0.7)
Eosinophils Relative: 2 % (ref 0–5)
Lymphocytes Relative: 27 % (ref 12–46)
Lymphs Abs: 2.4 10*3/uL (ref 0.7–4.0)
Monocytes Absolute: 0.6 10*3/uL (ref 0.1–1.0)
Monocytes Relative: 7 % (ref 3–12)
NEUTROS ABS: 5.6 10*3/uL (ref 1.7–7.7)
Neutrophils Relative %: 63 % (ref 43–77)

## 2014-09-24 LAB — COMPREHENSIVE METABOLIC PANEL
ALT: 23 U/L (ref 0–35)
ANION GAP: 9 (ref 5–15)
AST: 19 U/L (ref 0–37)
Albumin: 3.7 g/dL (ref 3.5–5.2)
Alkaline Phosphatase: 66 U/L (ref 39–117)
BUN: 22 mg/dL (ref 6–23)
CALCIUM: 9 mg/dL (ref 8.4–10.5)
CO2: 26 mmol/L (ref 19–32)
CREATININE: 1.28 mg/dL — AB (ref 0.50–1.10)
Chloride: 105 mmol/L (ref 96–112)
GFR calc non Af Amer: 47 mL/min — ABNORMAL LOW (ref >90)
GFR, EST AFRICAN AMERICAN: 54 mL/min — AB (ref >90)
Glucose, Bld: 155 mg/dL — ABNORMAL HIGH (ref 70–99)
Potassium: 3.6 mmol/L (ref 3.5–5.1)
Sodium: 140 mmol/L (ref 135–145)
Total Bilirubin: 0.7 mg/dL (ref 0.3–1.2)
Total Protein: 7.4 g/dL (ref 6.0–8.3)

## 2014-09-24 LAB — PROTIME-INR
INR: 1.11 (ref 0.00–1.49)
Prothrombin Time: 14.4 seconds (ref 11.6–15.2)

## 2014-09-24 LAB — I-STAT TROPONIN, ED: TROPONIN I, POC: 0 ng/mL (ref 0.00–0.08)

## 2014-09-24 LAB — APTT: aPTT: 32 seconds (ref 24–37)

## 2014-09-24 MED ORDER — DIPHENHYDRAMINE HCL 50 MG/ML IJ SOLN
25.0000 mg | Freq: Once | INTRAMUSCULAR | Status: DC
Start: 2014-09-24 — End: 2014-09-25

## 2014-09-24 MED ORDER — LORAZEPAM 2 MG/ML IJ SOLN
1.0000 mg | Freq: Once | INTRAMUSCULAR | Status: AC
  Administered 2014-09-24: 1 mg via INTRAVENOUS
  Filled 2014-09-24: qty 1

## 2014-09-24 MED ORDER — PROCHLORPERAZINE EDISYLATE 5 MG/ML IJ SOLN: 10.0000 mg | Freq: Four times a day (QID) | INTRAMUSCULAR | Status: DC | PRN

## 2014-09-24 NOTE — ED Notes (Signed)
Pt c/o trouble speaking x 3 days; pt appears to have pressured speech and is able to answer with some stuttering; pt sts some back spasms and HA and trouble with balance; pt mae

## 2014-09-24 NOTE — ED Provider Notes (Signed)
CSN: [id number]     Arrival date & time 09/24/14  1131 History   First MD Initiated Contact with Patient 09/24/14 1636     Chief Complaint  Patient presents with  . Aphasia     (Consider location/radiation/quality/duration/timing/severity/associated sxs/prior Treatment) HPI  Pt presenting with c/o difficulty speaking with stuttering speech.  She states this is similar to prior stroke she had several years ago.  Has been ongoing for the past 3 days.  Also states she feels it is difficult to keep right eye open.  She also states that her balance feels "off".  She c/o having back spasms and headache.  She states her back pain is chronic but worse than usual.  She states her headache is similar to her prior migraines.  No focal weakness.  She states she has some mild right sided facial droop left from her prior stroke.  There are no other associated systemic symptoms, there are no other alleviating or modifying factors.   Past Medical History  Diagnosis Date  . Hypertension   . Stroke   . Diabetes mellitus without complication   . Anemia   . Fibromyalgia   . Chronic pain   . Right sided weakness    Past Surgical History  Procedure Laterality Date  . Leg surgery Right 2007    Surgery x3  . Oophorectomy     Family History  Problem Relation Age of Onset  . Leukemia Brother   . Breast cancer Mother   . Breast cancer Maternal Grandmother    History  Substance Use Topics  . Smoking status: Never Smoker   . Smokeless tobacco: Never Used  . Alcohol Use: No   OB History    No data available     Review of Systems  ROS reviewed and all otherwise negative except for mentioned in HPI    Allergies  Morphine and related  Home Medications   Prior to Admission medications   Medication Sig Start Date End Date Taking? Authorizing Provider  albuterol (PROVENTIL HFA;VENTOLIN HFA) 108 (90 BASE) MCG/ACT inhaler Inhale 2 puffs into the lungs every 6 (six) hours as needed for wheezing.    Yes Historical Provider, MD  amLODipine (NORVASC) 10 MG tablet Take 10 mg by mouth every morning.    Yes Historical Provider, MD  atenolol (TENORMIN) 50 MG tablet Take 50 mg by mouth daily. 09/07/14  Yes Historical Provider, MD  atorvastatin (LIPITOR) 20 MG tablet Take 20 mg by mouth every evening.    Yes Historical Provider, MD  BELSOMRA 15 MG TABS Take 15 mg by mouth at bedtime. 09/13/14  Yes Historical Provider, MD  clonazePAM (KLONOPIN) 1 MG tablet Take 1 mg by mouth at bedtime.   Yes Historical Provider, MD  [employer] D3 2000 UNITS CAPS Take 4,000 Units by mouth daily. 06/21/14  Yes Historical Provider, MD  ferrous sulfate 325 (65 FE) MG tablet Take 325 mg by mouth daily. 09/13/14  Yes Historical Provider, MD  hydrochlorothiazide (HYDRODIURIL) 25 MG tablet Take 25 mg by mouth daily. 09/13/14  Yes Historical Provider, MD  metFORMIN (GLUCOPHAGE) 500 MG tablet Take 500 mg by mouth daily. 09/13/14  Yes Historical Provider, MD  pregabalin (LYRICA) 50 MG capsule Take 50 mg by mouth daily.   Yes Historical Provider, MD  traZODone (DESYREL) 100 MG tablet Take 100 mg by mouth at bedtime. 09/07/14  Yes Historical Provider, MD  Vitamin D, Ergocalciferol, (DRISDOL) 50000 UNITS CAPS capsule Take 50,000 Units by mouth every Monday.  Yes Historical Provider, MD  zolpidem (AMBIEN) 10 MG tablet Take 10 mg by mouth at bedtime as needed for sleep.   Yes Historical Provider, MD  cyclobenzaprine (FLEXERIL) 5 MG tablet Take 1 tablet (5 mg total) by mouth 3 (three) times daily as needed for muscle spasms. 09/03/13   Azalia Bilis, MD  ketorolac (TORADOL) 10 MG tablet Take 1 tablet (10 mg total) by mouth every 6 (six) hours as needed. 11/21/13   Abigail Harris, PA-C  ondansetron (ZOFRAN) 4 MG tablet Take 1 tablet (4 mg total) by mouth every 6 (six) hours. 06/16/14   Fayrene Helper, PA-C   BP 109/62 mmHg  Pulse 73  Temp(Src) 98.7 F (37.1 C) (Oral)  Resp 17  SpO2 94%  Vitals reviewed Physical Exam  Physical Examination: General  appearance - alert, well appearing, and in no distress Mental status - alert, oriented to person, place, and time Eyes - pupils equal and reactive, extraocular eye movements intact Mouth - mucous membranes moist, pharynx normal without lesions Chest - clear to auscultation, no wheezes, rales or rhonchi, symmetric air entry Heart - normal rate, regular rhythm, normal S1, S2, no murmurs, rubs, clicks or gallops Abdomen - soft, nontender, nondistended, no masses or organomegaly Neurological - alert, oriented x 3, cranial nerves 2-12 tested and intact, no nystagmus, intermittent stuttering, no facial droop, strength 5/5 in extremities x 4, sensation intact, FTN normal Extremities - peripheral pulses normal, no pedal edema, no clubbing or cyanosis Skin - normal coloration and turgor, no rashes  ED Course  Procedures (including critical care time)  5:23 PM d/w Dr. Hosie Poisson, neurohospitalist.  He states stuttering speech is almost never stroke related, but does recommend getting MRI.  If this is negative patient can have outpatient neuro followup.   Labs Review Labs Reviewed  COMPREHENSIVE METABOLIC PANEL - Abnormal; Notable for the following:    Glucose, Bld 155 (*)    Creatinine, Ser 1.28 (*)    GFR calc non Af Amer 47 (*)    GFR calc Af Amer 54 (*)    All other components within normal limits  PROTIME-INR  APTT  CBC  DIFFERENTIAL  I-STAT TROPOININ, ED    Imaging Review Ct Head (brain) Wo Contrast  09/24/2014   CLINICAL DATA:  Difficulty speaking for 3 days.  EXAM: CT HEAD WITHOUT CONTRAST  TECHNIQUE: Contiguous axial images were obtained from the base of the skull through the vertex without intravenous contrast.  COMPARISON:  05/15/2014  FINDINGS: Age advanced chronic microvascular changes throughout the deep white matter. No definite acute infarction. No hydrocephalus. No hemorrhage. No mass effect or midline shift.  No acute calvarial abnormality.  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 No acute intracranial process.   Electronically Signed   By: Charlett Nose M.D.   On: 09/24/2014 15:06   Mr Brain Wo Contrast  09/24/2014   CLINICAL DATA:  54 year old female with abnormal speech for 3 days. Headache and abnormal balance. Initial encounter.  EXAM: MRI HEAD WITHOUT CONTRAST  TECHNIQUE: Multiplanar, multiecho pulse sequences of the brain and surrounding structures were obtained without intravenous contrast.  COMPARISON:  Head CT without contrast 1455 hours today and earlier, including Brain MRI 04/16/2014.  FINDINGS: Stable cerebral volume. Major intracranial vascular flow voids are stable. Mild intracranial artery dolichoectasia again noted. No restricted diffusion to suggest acute infarction. No midline shift, mass effect, evidence of mass lesion, ventriculomegaly, extra-axial collection or acute intracranial hemorrhage. Cervicomedullary junction and pituitary are within normal  limits. Negative visualized cervical spine.  Widespread, Patchy and confluent cerebral white matter T2 and FLAIR hyperintensity has not significantly changed. Deep gray matter nuclei, brainstem and cerebellum are spared as before. No cortical encephalomalacia identified. No chronic blood products identified in the brain.  Visible internal auditory structures appear normal. Mastoids are clear. Trace paranasal sinus mucosal thickening. Visualized orbit soft tissues are within normal limits. Small bilateral nonspecific parotid gland nodules are re - identified and mildly progressed since 2014. This may be small intra parotid lymph nodes. Other visible scalp soft tissues are within normal limits. Visualized bone marrow signal is within normal limits.  IMPRESSION: 1.  No acute intracranial abnormality. 2. Moderate nonspecific cerebral white matter signal changes. Differential considerations include accelerated/hereditary small vessel ischemia, sequelae of trauma, hypercoagulable state,  vasculitis, migraines, prior infection or demyelination.   Electronically Signed   By: Odessa Fleming M.D.   On: 09/24/2014 20:45     EKG Interpretation None      MDM   Final diagnoses:  Bad headache    Pt presenting with c/o stuttering speech and feeling off balance.  Per chart review she has c/o similar symptoms in the past with reassuring MRI and neurology has felt these symptoms to be supratentorial.  D/w neurology today and they recommended MRI.  This was also reassuring with no acute findings specifically no stroke.  Pt also c/o migrain headache which felt somewhat improved after compazine and benadryl.  She was advised to have followup with outpatient neurology.  Discharged with strict return precautions.  Pt agreeable with plan.    Jerelyn Scott, MD 09/24/14 2159

## 2014-09-24 NOTE — ED Notes (Signed)
Patient transported to MRI 

## 2014-09-24 NOTE — Discharge Instructions (Signed)
Return to the ED with any concerns including weakness of arms or legs, changes in vision, vomiting and not able to keep down liquids, fainting, decreased level of alertness/lethargy, or any other alarming symptoms

## 2014-09-24 NOTE — ED Notes (Signed)
Dr. Linker at bedside  

## 2014-12-18 ENCOUNTER — Encounter: Payer: Self-pay | Admitting: Neurology

## 2014-12-18 ENCOUNTER — Ambulatory Visit (INDEPENDENT_AMBULATORY_CARE_PROVIDER_SITE_OTHER): Payer: Medicaid Other | Admitting: Neurology

## 2014-12-18 VITALS — BP 122/82 | HR 78 | Resp 20 | Ht 67.0 in | Wt 345.0 lb

## 2014-12-18 DIAGNOSIS — G2581 Restless legs syndrome: Secondary | ICD-10-CM | POA: Diagnosis not present

## 2014-12-18 DIAGNOSIS — G4761 Periodic limb movement disorder: Secondary | ICD-10-CM | POA: Diagnosis not present

## 2014-12-18 DIAGNOSIS — R600 Localized edema: Secondary | ICD-10-CM | POA: Diagnosis not present

## 2014-12-18 DIAGNOSIS — R351 Nocturia: Secondary | ICD-10-CM

## 2014-12-18 DIAGNOSIS — G4733 Obstructive sleep apnea (adult) (pediatric): Secondary | ICD-10-CM

## 2014-12-18 NOTE — Patient Instructions (Addendum)
Based on your symptoms and your exam I believe you are at risk for obstructive sleep apnea or OSA, and I think we should proceed with a sleep study to determine whether you do or do not have OSA and how severe it is. If you have more than mild OSA, I want you to consider treatment with CPAP. Please remember, the risks and ramifications of moderate to severe obstructive sleep apnea or OSA are: Cardiovascular disease, including congestive heart failure, stroke, difficult to control hypertension, arrhythmias, and even type 2 diabetes has been linked to untreated OSA. Sleep apnea causes disruption of sleep and sleep deprivation in most cases, which, in turn, can cause recurrent headaches, problems with memory, mood, concentration, focus, and vigilance. Most people with untreated sleep apnea report excessive daytime sleepiness, which can affect their ability to drive. Please do not drive if you feel sleepy.  I will see you back after your sleep study to go over the test results and where to go from there. We will call you after your sleep study and to set up an appointment at the time.   Our sleep lab administrative assistant, Leanord Hawkinglycia will call you to schedule your sleep study. If you don't hear back from her by next week please feel free to call her at [phone]. This is her direct line and please leave a message with your phone number to call back if you get the voicemail box.   Do not mix your sleep aids, take one or the other.

## 2014-12-18 NOTE — Progress Notes (Signed)
Subjective:    Patient ID: Yesenia [name] is a 54 y.o. female.  HPI     Yesenia Foley, MD, PhD [facility] [address], [address] [address] [place], [place] [address]  Dear Yesenia [name],   I saw your patient, Yesenia [name], upon your kind request in my neurologic clinic today for initial consultation of her sleep disorder, in particular, concern for underlying obstructive sleep apnea. The patient is unaccompanied today. As you know, Yesenia [name] is a 54 year old right-handed woman with an underlying medical history of fibromyalgia, hypertension, COPD, chronic back pain, diabetes, vitamin D deficiency, anemia, recurrent headaches (for which she had seen Dr. Everlena [name] at [facility]) and severe obesity, who reports difficulty with her sleep, snoring and excessive daytime somnolence. I reviewed your office note from 11/01/2014. Of note, she presented to the emergency room on 09/24/2014 with the faculty with her speech and balance problems ongoing for 3 days. She was noted to have stuttering. A brain MRI was negative for any acute changes. Apparently, she has had prior emergency room presentations in the recent couple of years with similar presentations and reassuring brain MRI findings. She's currently on Lyrica 50 mg 3 times a day, trazodone 100 mg at night, and Belsomra 15 mg daily, as far as potentially sedating medications are concerned. She is no longer on Ambien. She reports significant nocturia up to 45 times each night. She occasionally wakes up with a headache. She has snoring and abnormal sounds in her sleep and has woken herself up with a sense of gasping for air. She has a maternal aunt with obstructive sleep apnea who uses a CPAP machine. The patient also endorses restless leg symptoms and twitching in her sleep and she has been told by her boyfriend that she kicks in her sleep. She is a nonsmoker. She does not drink alcohol. She drinks caffeine occasionally, and if she drinks  caffeine she has to drink it before 9 AM, otherwise she will not sleep. She has gained weight. She has had difficulty initiating and maintaining sleep for over 15 years. She has tried multiple medications.   Her Past Medical History Is Significant For: Past Medical History  Diagnosis Date  . Hypertension   . Stroke   . Diabetes mellitus without complication   . Anemia   . Fibromyalgia   . Chronic pain   . Right sided weakness   . Vitamin deficiency     Vit D  . Hyperlipemia   . Anemia   . Insomnia   . Neuropathy     Her Past Surgical History Is Significant For: Past Surgical History  Procedure Laterality Date  . Leg surgery Right 2007    Surgery x3  . Oophorectomy    . Knee surgery  1992, 1995, 1997    Her Family History Is Significant For: Family History  Problem Relation Age of Onset  . Leukemia Brother   . Breast cancer Mother   . Breast cancer Maternal Grandmother     Her Social History Is Significant For: History   Social History  . Marital Status: Single    Spouse Name: N/A  . Number of Children: 2  . Years of Education: 12th    Occupational History  . [employer]    Social History Main Topics  . Smoking status: Never Smoker   . Smokeless tobacco: Never Used  . Alcohol Use: No  . Drug Use: No  . Sexual Activity: No   Other Topics Concern  .  None   Social History Narrative   Reports no caffeine use     Her Allergies Are:  Allergies  Allergen Reactions  . Morphine And Related Hives  :   Her Current Medications Are:  Outpatient Encounter Prescriptions as of 12/18/2014  Medication Sig  . albuterol (PROVENTIL HFA;VENTOLIN HFA) 108 (90 BASE) MCG/ACT inhaler Inhale 2 puffs into the lungs every 6 (six) hours as needed for wheezing.  Marland Kitchen amLODipine (NORVASC) 10 MG tablet Take 10 mg by mouth every morning.   Marland Kitchen atenolol (TENORMIN) 50 MG tablet Take 50 mg by mouth daily.  . BELSOMRA 15 MG TABS Take 15 mg by mouth at bedtime.  . [employer] D3 2000 UNITS CAPS Take  4,000 Units by mouth daily.  Marland Kitchen Dexlansoprazole 30 MG capsule Take 30 mg by mouth daily.  . ferrous sulfate 325 (65 FE) MG tablet Take 325 mg by mouth daily.  . hydrochlorothiazide (HYDRODIURIL) 25 MG tablet Take 25 mg by mouth daily.  . metFORMIN (GLUCOPHAGE) 500 MG tablet Take 500 mg by mouth daily.  . pregabalin (LYRICA) 50 MG capsule Take 50 mg by mouth daily.  . traZODone (DESYREL) 100 MG tablet Take 100 mg by mouth at bedtime.  . Vitamin D, Ergocalciferol, (DRISDOL) 50000 UNITS CAPS capsule Take 50,000 Units by mouth every Monday.   . [DISCONTINUED] ALPRAZolam (XANAX) 1 MG tablet Take 1 mg by mouth 2 (two) times daily as needed for anxiety.  . [DISCONTINUED] aspirin 81 MG tablet Take 81 mg by mouth daily.  . [DISCONTINUED] baclofen (LIORESAL) 10 MG tablet Take 10 mg by mouth 3 (three) times daily.  . [DISCONTINUED] clonazePAM (KLONOPIN) 1 MG tablet Take 1 mg by mouth at bedtime.  . [DISCONTINUED] cyclobenzaprine (FLEXERIL) 5 MG tablet Take 1 tablet (5 mg total) by mouth 3 (three) times daily as needed for muscle spasms.  . [DISCONTINUED] diclofenac (VOLTAREN) 75 MG EC tablet Take 75 mg by mouth 1 day or 1 dose.  . [DISCONTINUED] DULoxetine (CYMBALTA) 60 MG capsule Take 60 mg by mouth daily.  . [DISCONTINUED] furosemide (LASIX) 20 MG tablet Take 20 mg by mouth.  . [DISCONTINUED] HYDROcodone-acetaminophen (NORCO/VICODIN) 5-325 MG per tablet Take 1 tablet by mouth every 6 (six) hours as needed for moderate pain.  . [DISCONTINUED] ketorolac (TORADOL) 10 MG tablet Take 1 tablet (10 mg total) by mouth every 6 (six) hours as needed.  . [DISCONTINUED] lubiprostone (AMITIZA) 24 MCG capsule Take 24 mcg by mouth daily with breakfast.  . [DISCONTINUED] metroNIDAZOLE (FLAGYL) 500 MG tablet Take 500 mg by mouth 3 (three) times daily.  . [DISCONTINUED] nalbuphine (NUBAIN) 10 MG/ML injection Inject 10 mg into the vein every 3 (three) hours as needed.  . [DISCONTINUED] ondansetron (ZOFRAN) 4 MG tablet Take  1 tablet (4 mg total) by mouth every 6 (six) hours.  . [DISCONTINUED] potassium chloride (KLOR-CON) 8 MEQ tablet Take 8 mEq by mouth daily.  . [DISCONTINUED] tinidazole (TINDAMAX) 500 MG tablet Take by mouth daily with breakfast.  . [DISCONTINUED] zolpidem (AMBIEN) 10 MG tablet Take 10 mg by mouth at bedtime as needed for sleep.   No facility-administered encounter medications on file as of 12/18/2014.  :  Review of Systems:  Out of a complete 14 point review of systems, all are reviewed and negative with the exception of these symptoms as listed below:   Review of Systems  Constitutional: Positive for fatigue.  Respiratory: Positive for shortness of breath and wheezing.   Gastrointestinal: Positive for diarrhea.  Endocrine: Positive for  polydipsia.       Feeling hot, feeling cold   Musculoskeletal:       Joint pain, aching muscles   Neurological: Positive for weakness and headaches.       Trouble falling and staying asleep, snoring, no witnessed apnea, wakes up in the morning feeling tired, takes morning nap, denies taking daytime naps, daytime tiredness.   Hematological:       Anemia   Psychiatric/Behavioral:       Not enough sleep, decreased energy    Objective:  Neurologic Exam  Physical Exam Physical Examination:   Filed Vitals:   12/18/14 1434  BP: 122/82  Pulse: 78  Resp: 20   General Examination: The patient is a very pleasant 54 y.o. female in no acute distress. She appears well-developed and well-nourished and adequately groomed. She is severely obese.  HEENT: Normocephalic, atraumatic, pupils are equal, round and reactive to light and accommodation. Funduscopic exam is normal with sharp disc margins noted. Extraocular tracking is good without limitation to gaze excursion or nystagmus noted. Normal smooth pursuit is noted. Hearing is grossly intact. Tympanic membranes are clear bilaterally. Face is symmetric with normal facial animation and normal facial sensation.  Speech is clear with no dysarthria noted. There is no hypophonia. There is no lip, neck/head, jaw or voice tremor. Neck is supple with full range of passive and active motion. There are no carotid bruits on auscultation. Oropharynx exam reveals: moderate mouth dryness, adequate dental hygiene and moderate airway crowding, due to narrow airway entry, thicker soft palate, large uvula and tonsils in place. Mallampati is class III. As is 16-1/4 inches. Tongue protrudes centrally and palate elevates symmetrically. She has a Mild overbite. Nasal inspection reveals no significant nasal mucosal bogginess or redness and no septal deviation.   Chest: Clear to auscultation without wheezing, rhonchi or crackles noted.  Heart: S1+S2+0, regular and normal without murmurs, rubs or gallops noted.   Abdomen: Soft, non-tender and non-distended with normal bowel sounds appreciated on auscultation.  Extremities: There is 1+ pitting edema in the distal lower extremities bilaterally. Pedal pulses are intact.  Skin: Warm and dry without trophic changes noted. There are no varicose veins.  Musculoskeletal: exam reveals no obvious joint deformities, tenderness or joint swelling or erythema.   Neurologically:  Mental status: The patient is awake, alert and oriented in all 4 spheres. Her immediate and remote memory, attention, language skills and fund of knowledge are appropriate. There is no evidence of aphasia, agnosia, apraxia or anomia. Speech is clear with normal prosody and enunciation. Thought process is linear. Mood is normal and affect is constricted.  Cranial nerves II - XII are as described above under HEENT exam. In addition: shoulder shrug is normal with equal shoulder height noted. Motor exam: Normal bulk, strength and tone is noted. There is no drift, tremor or rebound. Romberg is negative. Reflexes are 2+ throughout. Babinski: Toes are flexor bilaterally. Fine motor skills and coordination: intact with normal  finger taps, normal hand movements, normal rapid alternating patting, normal foot taps and normal foot agility.  Cerebellar testing: No dysmetria or intention tremor on finger to nose testing. Heel to shin is not possible secondary to body habitus. There is no truncal or gait ataxia.  Sensory exam: intact to light touch, pinprick, vibration, temperature sense in the upper and lower extremities.  Gait, station and balance: She stands with difficulty.  tandem walk is not possible, secondary to body habitus. Gait is slow and cautious.   Assessment and  plan:   In summary, Brenna Friesenhahn is a very pleasant 54 y.o.-year old female with an underlying medical history of fibromyalgia, hypertension, COPD, chronic back pain, diabetes, vitamin D deficiency, anemia, recurrent headaches (for which she had seen Dr. Everlena [name] at [facility]) and severe obesity, who reports difficulty with her sleep, snoring and excessive daytime somnolence. Her history and physical exam are in keeping with obstructive sleep apnea. In addition, she has significant nocturia and reports morning headaches as well as some restless leg symptoms and leg twitching at night, in keeping with periodic leg movements of sleep.  I had a long chat with the patient about my findings and the diagnosis of OSA, its prognosis and treatment options. We talked about medical treatments, surgical interventions and non-pharmacological approaches. I explained in particular the risks and ramifications of untreated moderate to severe OSA, especially with respect to developing cardiovascular disease down the Road, including congestive heart failure, difficult to treat hypertension, cardiac arrhythmias, or stroke. Even type 2 diabetes has, in part, been linked to untreated OSA. Symptoms of untreated OSA include daytime sleepiness, memory problems, mood irritability and mood disorder such as depression and anxiety, lack of energy, as well as recurrent headaches,  especially morning headaches. We talked about trying to maintain a healthy lifestyle in general, as well as the importance of weight control. I encouraged the patient to eat healthy, exercise daily and keep well hydrated, to keep a scheduled bedtime and wake time routine, to not skip any meals and eat healthy snacks in between meals. I advised the patient not to drive when feeling sleepy. Furthermore, she is advised not to combine prescription sleeping pills or over-the-counter sleeping Pills in addition to prescription sleeping pills. She's currently taking both trazodone as well as Belsomra.  I recommended the following at this time: sleep study with potential positive airway pressure titration. (We will score hypopneas at 4% and split the sleep study into diagnostic and treatment portion, if the estimated. 2 hour AHI is >15/h).   I explained the sleep test procedure to the patient and also outlined possible surgical and non-surgical treatment options of OSA, including the use of a custom-made dental device (which would require a referral to a specialist dentist or oral surgeon), upper airway surgical options, such as pillar implants, radiofrequency surgery, tongue base surgery, and UPPP (which would involve a referral to an ENT surgeon). Rarely, jaw surgery such as mandibular advancement may be considered.  I also explained the CPAP treatment option to the patient, who indicated that she would be willing to try CPAP if the need arises. I explained the importance of being compliant with PAP treatment, not only for insurance purposes but primarily to improve Her symptoms, and for the patient's long term health benefit, including to reduce Her cardiovascular risks. I answered all her questions today and the patient was in agreement. I would like to see her back after the sleep study is completed and encouraged her to call with any interim questions, concerns, problems or updates.   Thank you very much for  allowing me to participate in the care of this nice patient. If I can be of any further assistance to you please do not hesitate to call me at [phone].  Sincerely,   Yesenia Foley, MD, PhD

## 2015-01-08 ENCOUNTER — Ambulatory Visit (INDEPENDENT_AMBULATORY_CARE_PROVIDER_SITE_OTHER): Payer: Medicaid Other | Admitting: Neurology

## 2015-01-08 VITALS — BP 110/67 | HR 85

## 2015-01-08 DIAGNOSIS — G479 Sleep disorder, unspecified: Secondary | ICD-10-CM

## 2015-01-08 DIAGNOSIS — G4720 Circadian rhythm sleep disorder, unspecified type: Secondary | ICD-10-CM

## 2015-01-08 DIAGNOSIS — G4733 Obstructive sleep apnea (adult) (pediatric): Secondary | ICD-10-CM | POA: Diagnosis not present

## 2015-01-08 DIAGNOSIS — G4761 Periodic limb movement disorder: Secondary | ICD-10-CM

## 2015-01-09 NOTE — Sleep Study (Signed)
Please see the scanned sleep study interpretation located in the Procedure tab within the Chart Review section. 

## 2015-01-20 ENCOUNTER — Telehealth: Payer: Self-pay | Admitting: Neurology

## 2015-01-20 NOTE — Telephone Encounter (Signed)
I spoke to patient and she is aware Dr. Frances FurbishAthar is ooo. She has been advised that I will call her back as soon as I get the results.

## 2015-01-20 NOTE — Telephone Encounter (Signed)
Patient called requesting the results of her sleep test. Please call and advise.

## 2015-01-27 ENCOUNTER — Telehealth: Payer: Self-pay | Admitting: Neurology

## 2015-01-27 DIAGNOSIS — G4733 Obstructive sleep apnea (adult) (pediatric): Secondary | ICD-10-CM

## 2015-01-27 NOTE — Telephone Encounter (Signed)
[facility] talking to patient, for scheduling CPAP study, thx

## 2015-01-27 NOTE — Telephone Encounter (Signed)
Please call and notify the patient that the recent sleep study did confirm the diagnosis of obstructive sleep apnea and that I recommend treatment for this in the form of CPAP. This will require a repeat sleep study for proper titration and mask fitting. Please explain to patient and arrange for a CPAP titration study. I have placed an order in the chart. Thanks, Izzah Pasqua, MD, PhD [facility] ([facility])  

## 2015-01-27 NOTE — Telephone Encounter (Signed)
I spoke to [facility] and she is aware of results and is willing to proceed with second study. She is aware that [facility] will call her to schedule.

## 2015-02-05 NOTE — Telephone Encounter (Signed)
Results given to pt

## 2015-02-17 ENCOUNTER — Ambulatory Visit (INDEPENDENT_AMBULATORY_CARE_PROVIDER_SITE_OTHER): Payer: Medicaid Other | Admitting: Neurology

## 2015-02-17 DIAGNOSIS — G4733 Obstructive sleep apnea (adult) (pediatric): Secondary | ICD-10-CM

## 2015-02-17 NOTE — Sleep Study (Signed)
Please see the scanned sleep study interpretation located in the Procedure tab within the Chart Review section. 

## 2015-02-24 ENCOUNTER — Telehealth: Payer: Self-pay | Admitting: Neurology

## 2015-02-24 DIAGNOSIS — G4733 Obstructive sleep apnea (adult) (pediatric): Secondary | ICD-10-CM

## 2015-02-24 NOTE — Telephone Encounter (Signed)
Patient seen on 12/18/14, PSG on 01/08/15, CPAP study on 02/17/15, insurance: Medicaid.   Please call and inform patient that I have entered an order for treatment with PAP. She did well during the latest sleep study with CPAP. We will, therefore, arrange for a machine for home use through a DME (durable medical equipment) company of Her choice; and I will see the patient back in follow-up in about 8-10 weeks. Please also explain to the patient that I will be looking out for compliance data downloaded from the machine, which can be done remotely through a modem at times or stored on an SD card in the back of the machine. At the time of the followup appointment we will discuss sleep study results and how it is going with PAP treatment at home. Please advise patient to bring Her\ machine at the time of the visit; at least for the first visit, even though this is cumbersome. Bringing the machine for every visit after that may not be needed, but often helps for the first visit. Please also make sure, the patient has a follow-up appointment with me in about 8-10 weeks from the setup date, thanks.   Huston Foley, MD, PhD [facility])

## 2015-02-26 ENCOUNTER — Telehealth: Payer: Self-pay | Admitting: Neurology

## 2015-02-26 NOTE — Telephone Encounter (Signed)
I spoke to patient and she is aware of results and would like to start CPAP treatment. I will send order to [facility]. She has Medicaid and lives in [place]. I will fax report to PCP. We were able to set f/u up. I will also send her a letter reminding her to f/u, the importance of compliance and letting her know who we referred her too.

## 2015-02-26 NOTE — Telephone Encounter (Signed)
Pt is calling to get results of recent sleep study

## 2015-02-26 NOTE — Telephone Encounter (Signed)
Duplicate message. 

## 2015-04-23 ENCOUNTER — Ambulatory Visit: Payer: Medicaid Other | Admitting: Neurology

## 2015-05-14 ENCOUNTER — Ambulatory Visit (INDEPENDENT_AMBULATORY_CARE_PROVIDER_SITE_OTHER): Payer: Medicaid Other | Admitting: Adult Health

## 2015-05-14 ENCOUNTER — Telehealth: Payer: Self-pay

## 2015-05-14 ENCOUNTER — Ambulatory Visit: Payer: Medicaid Other | Admitting: Neurology

## 2015-05-14 ENCOUNTER — Encounter: Payer: Self-pay | Admitting: Adult Health

## 2015-05-14 ENCOUNTER — Ambulatory Visit: Payer: Self-pay | Admitting: Neurology

## 2015-05-14 VITALS — BP 124/87 | HR 118 | Ht 66.0 in | Wt 330.5 lb

## 2015-05-14 DIAGNOSIS — G4733 Obstructive sleep apnea (adult) (pediatric): Secondary | ICD-10-CM | POA: Diagnosis not present

## 2015-05-14 DIAGNOSIS — Z9989 Dependence on other enabling machines and devices: Principal | ICD-10-CM

## 2015-05-14 NOTE — Patient Instructions (Signed)
Try Nasal Pillows  If unable to tolerate please let us know.  Try to establish a regular sleep routine.  If your symptoms worsen or you develop new symptoms please let us know.

## 2015-05-14 NOTE — Progress Notes (Signed)
PATIENT: Yesenia [name] DOB: 08-05-1960  REASON FOR VISIT: follow up- OSA on CPAP HISTORY FROM: patient  HISTORY OF PRESENT ILLNESS: Yesenia [name] is a 54 year old female with a history of obstructive sleep apnea on CPAP. She was just recently started on CPAP and returns today for a download. Her download indicates that she uses her machine 14 out of 30 days for compliance of 47%. On average she uses the machine  2hours and 24 minutes. She uses the machine greater than 4 hours only 2 out of 30 days for compliance of 70%. Her AHI was 3.2 on 10 cm of water with EPR of 2. She did have a leak in the 95th percentile at 32.4 L/m. The patient states that she is wearing a full face mask but she is unable to tolerate this. She states that she cannot adjust to having the mask cover her face. She has tried the nasal mask but was unable to tolerate that as well. She states that she does not endorse a regular sleep routine. She does not have a set  bedtime nor awake time. She states that she continues to have trouble falling asleep. She denies any new medical issues. She returns today for an evaluation.  HISTORY 12/18/14 Frances Furbish(Athar): I saw your patient, Yesenia [name], upon your kind request in my neurologic clinic today for initial consultation of her sleep disorder, in particular, concern for underlying obstructive sleep apnea. The patient is unaccompanied today. As you know, Yesenia [name] is a 54 year old right-handed woman with an underlying medical history of fibromyalgia, hypertension, COPD, chronic back pain, diabetes, vitamin D deficiency, anemia, recurrent headaches (for which she had seen Dr. Everlena CooperJaffe at [facility]) and severe obesity, who reports difficulty with her sleep, snoring and excessive daytime somnolence. I reviewed your office note from 11/01/2014. Of note, she presented to the emergency room on 09/24/2014 with the faculty with her speech and balance problems ongoing for 3 days. She was noted to have  stuttering. A brain MRI was negative for any acute changes. Apparently, she has had prior emergency room presentations in the recent couple of years with similar presentations and reassuring brain MRI findings. She's currently on Lyrica 50 mg 3 times a day, trazodone 100 mg at night, and Belsomra 15 mg daily, as far as potentially sedating medications are concerned. She is no longer on Ambien. She reports significant nocturia up to 45 times each night. She occasionally wakes up with a headache. She has snoring and abnormal sounds in her sleep and has woken herself up with a sense of gasping for air. She has a maternal aunt with obstructive sleep apnea who uses a CPAP machine. The patient also endorses restless leg symptoms and twitching in her sleep and she has been told by her boyfriend that she kicks in her sleep. She is a nonsmoker. She does not drink alcohol. She drinks caffeine occasionally, and if she drinks caffeine she has to drink it before 9 AM, otherwise she will not sleep. She has gained weight. She has had difficulty initiating and maintaining sleep for over 15 years. She has tried multiple medications.   REVIEW OF SYSTEMS: Out of a complete 14 system review of symptoms, the patient complains only of the following symptoms, and all other reviewed systems are negative.  Fatigue, eye pain, eye discharge, wheezing, leg swelling, diarrhea, nausea, abdominal pain, restless leg, apnea, frequency of urination, anemia, weakness  ALLERGIES: Allergies  Allergen Reactions  . Morphine And Related Hives  HOME MEDICATIONS: Outpatient Prescriptions Prior to Visit  Medication Sig Dispense Refill  . albuterol (PROVENTIL HFA;VENTOLIN HFA) 108 (90 BASE) MCG/ACT inhaler Inhale 2 puffs into the lungs every 6 (six) hours as needed for wheezing.    [name] Kitchen amLODipine (NORVASC) 10 MG tablet Take 10 mg by mouth every morning.     [name] Kitchen atenolol (TENORMIN) 50 MG tablet Take 50 mg by mouth daily.  3  . BELSOMRA 15 MG  TABS Take 15 mg by mouth at bedtime.  3  . [employer] D3 2000 UNITS CAPS Take 4,000 Units by mouth daily.  3  . Dexlansoprazole 30 MG capsule Take 30 mg by mouth daily.    . ferrous sulfate 325 (65 FE) MG tablet Take 325 mg by mouth daily.  6  . hydrochlorothiazide (HYDRODIURIL) 25 MG tablet Take 25 mg by mouth daily.  3  . metFORMIN (GLUCOPHAGE) 500 MG tablet Take 500 mg by mouth daily.  3  . pregabalin (LYRICA) 50 MG capsule Take 50 mg by mouth daily.    . traZODone (DESYREL) 100 MG tablet Take 100 mg by mouth at bedtime.  2  . Vitamin D, Ergocalciferol, (DRISDOL) 50000 UNITS CAPS capsule Take 50,000 Units by mouth every Monday.      No facility-administered medications prior to visit.    PAST MEDICAL HISTORY: Past Medical History  Diagnosis Date  . Hypertension   . Stroke (HCC)   . Diabetes mellitus without complication (HCC)   . Anemia   . Fibromyalgia   . Chronic pain   . Right sided weakness   . Vitamin deficiency     Vit D  . Hyperlipemia   . Anemia   . Insomnia   . Neuropathy (HCC)     PAST SURGICAL HISTORY: Past Surgical History  Procedure Laterality Date  . Leg surgery Right 2007    Surgery x3  . Oophorectomy    . Knee surgery  1992, 1995, 1997    FAMILY HISTORY: Family History  Problem Relation Age of Onset  . Leukemia Brother   . Breast cancer Mother   . Breast cancer Maternal Grandmother     SOCIAL HISTORY: Social History   Social History  . Marital Status: Single    Spouse Name: N/A  . Number of Children: 2  . Years of Education: 12th    Occupational History  . [employer]    Social History Main Topics  . Smoking status: Never Smoker   . Smokeless tobacco: Never Used  . Alcohol Use: No  . Drug Use: No  . Sexual Activity: No   Other Topics Concern  . Not on file   Social History Narrative   Reports no caffeine use       PHYSICAL EXAM  Filed Vitals:   05/14/15 1347  BP: 124/87  Pulse: 118  Height:  (1.676 m)  Weight: 330 lb 8 oz  (149.914 kg)   Body mass index is 53.37 kg/(m^2).  Generalized: Well developed, in no acute distress, obese  Neck: Circumference 17-1/2 inches, Mallampati 4+  Neurological examination  Mentation: Alert oriented to time, place, history taking. Follows all commands speech and language fluent Cranial nerve II-XII: Pupils were equal round reactive to light. Extraocular movements were full, visual field were full on confrontational test. Facial sensation and strength were normal. Uvula tongue midline. Head turning and shoulder shrug  were normal and symmetric. Motor: The motor testing reveals 5 over 5 strength of all 4 extremities. Good symmetric motor tone is  noted throughout.  Sensory: Sensory testing is intact to soft touch on all 4 extremities. No evidence of extinction is noted.  Coordination: Cerebellar testing reveals good finger-nose-finger and heel-to-shin bilaterally.  Gait and station: Gait is normal.  Reflexes: Deep tendon reflexes are symmetric and normal bilaterally.   DIAGNOSTIC DATA (LABS, IMAGING, TESTING) - I reviewed patient records, labs, notes, testing and imaging myself where available.  Lab Results  Component Value Date   WBC 8.7 09/24/2014   HGB 12.8 09/24/2014   HCT 39.3 09/24/2014   MCV 87.3 09/24/2014   PLT 332 09/24/2014      Component Value Date/Time   NA 140 09/24/2014 1255   K 3.6 09/24/2014 1255   CL 105 09/24/2014 1255   CO2 26 09/24/2014 1255   GLUCOSE 155* 09/24/2014 1255   BUN 22 09/24/2014 1255   CREATININE 1.28* 09/24/2014 1255   CALCIUM 9.0 09/24/2014 1255   PROT 7.4 09/24/2014 1255   ALBUMIN 3.7 09/24/2014 1255   AST 19 09/24/2014 1255   ALT 23 09/24/2014 1255   ALKPHOS 66 09/24/2014 1255   BILITOT 0.7 09/24/2014 1255   GFRNONAA 47* 09/24/2014 1255   GFRAA 54* 09/24/2014 1255    ASSESSMENT AND PLAN 54 y.o. year old female  has a past medical history of Hypertension; Stroke (HCC); Diabetes mellitus without complication (HCC); Anemia;  Fibromyalgia; Chronic pain; Right sided weakness; Vitamin deficiency; Hyperlipemia; Anemia; Insomnia; and Neuropathy (HCC). here with:  1. Obstructive sleep apnea on CPAP  The patient cannot tolerate a full face mask. I will try the patient on nasal pillows. Robin from the sleep lab provided the patient with nasal pillows to try at home. She instructed the patient on how to apply the mask and adjust the straps. Patient is amenable to giving this a try. I advised the patient that if she is unable to tolerate the new mask she should let us know. She will follow-up in 3-4 months with Dr. Frances Furbish.  Butch Penny, MSN, NP-C 05/14/2015, 1:55 PM [facility] [address] [place], [place] [address] [phone]

## 2015-05-14 NOTE — Progress Notes (Signed)
I agree with the assessment and plan as directed by NP .The patient is known to me .   Yariana Hoaglund, MD  

## 2015-05-14 NOTE — Telephone Encounter (Signed)
Left message stating [facility] to Dr. Frances FurbishAthar is out sick. I asked patient to call back if that does not work for her, otherwise [place] will see her at 2pm today.

## 2015-07-06 ENCOUNTER — Encounter (HOSPITAL_COMMUNITY): Payer: Self-pay | Admitting: *Deleted

## 2015-07-06 ENCOUNTER — Emergency Department (HOSPITAL_COMMUNITY): Payer: Medicaid Other

## 2015-07-06 ENCOUNTER — Observation Stay (HOSPITAL_COMMUNITY)
Admission: EM | Admit: 2015-07-06 | Discharge: 2015-07-09 | Disposition: A | Discharge status: Skilled Nursing Facility | Payer: Medicaid Other | Attending: Internal Medicine | Admitting: Internal Medicine

## 2015-07-06 DIAGNOSIS — M25552 Pain in left hip: Secondary | ICD-10-CM | POA: Diagnosis not present

## 2015-07-06 DIAGNOSIS — E876 Hypokalemia: Secondary | ICD-10-CM | POA: Insufficient documentation

## 2015-07-06 DIAGNOSIS — Z7984 Long term (current) use of oral hypoglycemic drugs: Secondary | ICD-10-CM | POA: Diagnosis not present

## 2015-07-06 DIAGNOSIS — E785 Hyperlipidemia, unspecified: Secondary | ICD-10-CM | POA: Insufficient documentation

## 2015-07-06 DIAGNOSIS — I129 Hypertensive chronic kidney disease with stage 1 through stage 4 chronic kidney disease, or unspecified chronic kidney disease: Secondary | ICD-10-CM | POA: Insufficient documentation

## 2015-07-06 DIAGNOSIS — Z8673 Personal history of transient ischemic attack (TIA), and cerebral infarction without residual deficits: Secondary | ICD-10-CM | POA: Insufficient documentation

## 2015-07-06 DIAGNOSIS — N183 Chronic kidney disease, stage 3 (moderate): Secondary | ICD-10-CM | POA: Insufficient documentation

## 2015-07-06 DIAGNOSIS — D638 Anemia in other chronic diseases classified elsewhere: Secondary | ICD-10-CM | POA: Insufficient documentation

## 2015-07-06 DIAGNOSIS — N390 Urinary tract infection, site not specified: Secondary | ICD-10-CM | POA: Insufficient documentation

## 2015-07-06 DIAGNOSIS — I152 Hypertension secondary to endocrine disorders: Secondary | ICD-10-CM | POA: Diagnosis present

## 2015-07-06 DIAGNOSIS — J449 Chronic obstructive pulmonary disease, unspecified: Secondary | ICD-10-CM | POA: Diagnosis not present

## 2015-07-06 DIAGNOSIS — N189 Chronic kidney disease, unspecified: Secondary | ICD-10-CM | POA: Diagnosis present

## 2015-07-06 DIAGNOSIS — E1122 Type 2 diabetes mellitus with diabetic chronic kidney disease: Secondary | ICD-10-CM | POA: Insufficient documentation

## 2015-07-06 DIAGNOSIS — E1142 Type 2 diabetes mellitus with diabetic polyneuropathy: Secondary | ICD-10-CM

## 2015-07-06 DIAGNOSIS — I10 Essential (primary) hypertension: Secondary | ICD-10-CM | POA: Insufficient documentation

## 2015-07-06 DIAGNOSIS — E559 Vitamin D deficiency, unspecified: Secondary | ICD-10-CM | POA: Diagnosis not present

## 2015-07-06 DIAGNOSIS — G8929 Other chronic pain: Secondary | ICD-10-CM | POA: Diagnosis present

## 2015-07-06 DIAGNOSIS — N179 Acute kidney failure, unspecified: Secondary | ICD-10-CM | POA: Insufficient documentation

## 2015-07-06 DIAGNOSIS — R269 Unspecified abnormalities of gait and mobility: Secondary | ICD-10-CM

## 2015-07-06 DIAGNOSIS — Z6841 Body Mass Index (BMI) 40.0 and over, adult: Secondary | ICD-10-CM | POA: Diagnosis not present

## 2015-07-06 DIAGNOSIS — Z79899 Other long term (current) drug therapy: Secondary | ICD-10-CM | POA: Diagnosis not present

## 2015-07-06 DIAGNOSIS — E118 Type 2 diabetes mellitus with unspecified complications: Secondary | ICD-10-CM | POA: Diagnosis not present

## 2015-07-06 DIAGNOSIS — M25559 Pain in unspecified hip: Secondary | ICD-10-CM

## 2015-07-06 DIAGNOSIS — M797 Fibromyalgia: Secondary | ICD-10-CM | POA: Insufficient documentation

## 2015-07-06 DIAGNOSIS — E1159 Type 2 diabetes mellitus with other circulatory complications: Secondary | ICD-10-CM | POA: Diagnosis present

## 2015-07-06 HISTORY — DX: Chronic kidney disease, unspecified: N18.9

## 2015-07-06 LAB — I-STAT CHEM 8, ED
BUN: 37 mg/dL — ABNORMAL HIGH (ref 6–20)
Calcium, Ion: 1.17 mmol/L (ref 1.12–1.23)
Chloride: 106 mmol/L (ref 101–111)
Creatinine, Ser: 1.9 mg/dL — ABNORMAL HIGH (ref 0.44–1.00)
Glucose, Bld: 113 mg/dL — ABNORMAL HIGH (ref 65–99)
HCT: 36 % (ref 36.0–46.0)
Hemoglobin: 12.2 g/dL (ref 12.0–15.0)
Potassium: 3.9 mmol/L (ref 3.5–5.1)
Sodium: 144 mmol/L (ref 135–145)
TCO2: 28 mmol/L (ref 0–100)

## 2015-07-06 LAB — URINE MICROSCOPIC-ADD ON

## 2015-07-06 LAB — URINALYSIS, ROUTINE W REFLEX MICROSCOPIC
Bilirubin Urine: NEGATIVE
Glucose, UA: NEGATIVE mg/dL
Ketones, ur: NEGATIVE mg/dL
Leukocytes, UA: NEGATIVE
Nitrite: NEGATIVE
Protein, ur: NEGATIVE mg/dL
Specific Gravity, Urine: 1.019 (ref 1.005–1.030)
pH: 5 (ref 5.0–8.0)

## 2015-07-06 LAB — BASIC METABOLIC PANEL
Anion gap: 8 (ref 5–15)
BUN: 23 mg/dL — AB (ref 6–20)
CALCIUM: 8.5 mg/dL — AB (ref 8.9–10.3)
CHLORIDE: 109 mmol/L (ref 101–111)
CO2: 25 mmol/L (ref 22–32)
CREATININE: 1.58 mg/dL — AB (ref 0.44–1.00)
GFR calc non Af Amer: 36 mL/min — ABNORMAL LOW (ref >60)
GFR, EST AFRICAN AMERICAN: 42 mL/min — AB (ref >60)
Glucose, Bld: 104 mg/dL — ABNORMAL HIGH (ref 65–99)
Potassium: 3.5 mmol/L (ref 3.5–5.1)
Sodium: 142 mmol/L (ref 135–145)

## 2015-07-06 MED ORDER — SODIUM CHLORIDE 0.9 % IV BOLUS (SEPSIS)
1000.0000 mL | Freq: Once | INTRAVENOUS | Status: AC
  Administered 2015-07-06: 1000 mL via INTRAVENOUS

## 2015-07-06 MED ORDER — FENTANYL CITRATE (PF) 100 MCG/2ML IJ SOLN: 25.0000 ug | Freq: Once | INTRAMUSCULAR | Status: DC

## 2015-07-06 MED ORDER — HYDROMORPHONE HCL 1 MG/ML IJ SOLN
1.0000 mg | Freq: Once | INTRAMUSCULAR | Status: AC
  Administered 2015-07-06: 1 mg via INTRAVENOUS
  Filled 2015-07-06: qty 1

## 2015-07-06 MED ORDER — FENTANYL CITRATE (PF) 100 MCG/2ML IJ SOLN
50.0000 ug | Freq: Once | INTRAMUSCULAR | Status: AC
  Administered 2015-07-06: 50 ug via INTRAVENOUS
  Filled 2015-07-06: qty 2

## 2015-07-06 NOTE — ED Notes (Addendum)
Pt states hx of chronic lower back pain.  L hip pain began 2 days ago and became so bad that she couldn't get up off the couch and had to call 911.  Pt also c/o diarrhea x several weeks and now she feels weak.

## 2015-07-06 NOTE — ED Provider Notes (Signed)
Patient complains of left hip pain, nonradiating onset yesterday. She's been evaluated recently by her primary care physician prescribed oxycodone which she states, without relief. She also reports diarrhea for 5 episodes per day for the past 6 months however none today last episode of diarrhea was yesterday. On exam patient is alert and nontoxic abdomen morbidly obese, pelvis stable, nontender. She has exquisite pain at left hip upon getting up to walk and is unable to walk due to pain she is able to stand albeit with significant pain at left hip  Doug SouSam Timothy Trudell, MD 07/06/15 1558

## 2015-07-06 NOTE — ED Notes (Signed)
Spoke with CT.Marland Kitchen.Marland Kitchen.Radiologist Dr Denny LevySchick is requesting ED physician to contact him re: pt with elevated Creatinine. Dr Preston FleetingGlick notified.

## 2015-07-06 NOTE — H&P (Signed)
[facility] History and Physical  [name] BJY:[id number] DOB: 02/25/1961 DOA: 07/06/2015  Referring physician: ED PCP: Egbert GaribaldiMillsaps, KIMBERLY M, NP   Chief Complaint: Left hip pain   HPI:  Ms. Yesenia [name] is a 54 year old female with a past medical history significant for HTN, HLD, fibromyalgia, chronic pain, CKD, neuropathy, previous history of stroke, migraines symptoms started 2 days ago with pain on the left lower side of her back, and upon awaking this morning pain was in her left hip and upper thigh. Pain is worsened by any movement on that side. Review previous records. Patient has had similar complaints in the past with dealing with weakness, headaches, and dizziness. She notes inability to ambulate due to the significance of the pain on the left and now states that she is starting to have pain in the right leg as well. Also notes recent treatment for urinary tract infection with ciprofloxacin which she had completed everything except for 4 pills on the medications.  Upon admission into the ED, patient was evaluated with a x-ray which showed no acute abnormalities and then followed up with a CT scan which also showed no signs of septic arthritis. Patient unable to ambulate and she states that she lives alone and has no way of care for herself.    Review of Systems  Constitutional: Negative for fever and chills.  HENT: Negative for ear pain and tinnitus.   Eyes: Negative for double vision and photophobia.  Cardiovascular: Negative for chest pain and palpitations.  Gastrointestinal: Negative for nausea and vomiting.  Genitourinary: Positive for frequency. Negative for hematuria.  Musculoskeletal: Positive for back pain and joint pain. Negative for falls.  Skin: Negative for itching and rash.  Neurological: Positive for weakness. Negative for tremors and loss of consciousness.  Endo/Heme/Allergies: Negative for environmental allergies and polydipsia.  Psychiatric/Behavioral: Negative  for hallucinations.       Past Medical History  Diagnosis Date  . Hypertension   . Stroke (HCC)   . Diabetes mellitus without complication (HCC)   . Anemia   . Fibromyalgia   . Chronic pain   . Right sided weakness   . Vitamin deficiency     Vit D  . Hyperlipemia   . Anemia   . Insomnia   . Neuropathy [facility](HCC)      Past Surgical History  Procedure Laterality Date  . Leg surgery Right 2007    Surgery x3  . Oophorectomy    . Knee surgery  1992, 1995, 1997      Social History:  reports that she has never smoked. She has never used smokeless tobacco. She reports that she does not drink alcohol or use illicit drugs. Where does patient live--home   and with whom if at home? Alone Can patient participate in ADLs? Needs assistance now  Allergies  Allergen Reactions  . Morphine And Related Hives    Family History  Problem Relation Age of Onset  . Leukemia Brother   . Breast cancer Mother   . Breast cancer Maternal Grandmother        Prior to Admission medications   Medication Sig Start Date End Date Taking? Authorizing Provider  albuterol (PROVENTIL HFA;VENTOLIN HFA) 108 (90 BASE) MCG/ACT inhaler Inhale 2 puffs into the lungs every 6 (six) hours as needed for wheezing.   Yes Historical Provider, MD  amLODipine (NORVASC) 10 MG tablet Take 10 mg by mouth every morning.    Yes Historical Provider, MD  atenolol (TENORMIN) 50 MG tablet Take 50 mg  by mouth daily. 11/29/14  Yes Historical Provider, MD  BELSOMRA 15 MG TABS Take 15 mg by mouth at bedtime. 09/13/14  Yes Historical Provider, MD  ciprofloxacin (CIPRO) 500 MG tablet Take 1 tablet by mouth 2 (two) times daily. 06/29/15  Yes Historical Provider, MD  ferrous sulfate 325 (65 FE) MG tablet Take 325 mg by mouth daily. 09/13/14  Yes Historical Provider, MD  hydrochlorothiazide (HYDRODIURIL) 25 MG tablet Take 25 mg by mouth daily. 09/13/14  Yes Historical Provider, MD  metFORMIN (GLUCOPHAGE) 500 MG tablet Take 500 mg by mouth  daily. 09/13/14  Yes Historical Provider, MD  pregabalin (LYRICA) 50 MG capsule Take 50 mg by mouth daily.   Yes Historical Provider, MD  traZODone (DESYREL) 100 MG tablet Take 100 mg by mouth at bedtime. 09/07/14  Yes Historical Provider, MD  Dexlansoprazole 30 MG capsule Take 30 mg by mouth daily.    Historical Provider, MD     Physical Exam: Filed Vitals:   07/06/15 1830 07/06/15 1900 07/06/15 1930 07/06/15 2205  BP: 110/78 92/66 101/69 120/79  Pulse: 89 87 85 92  Temp:      TempSrc:      Resp: Height:      SpO2: 91% 91% 91% 96%    Constitutional: Vital signs reviewed. Patient is morbidly obese female complaining of pain even with simple movements. Head: Normocephalic and atraumatic  Ear: TM normal bilaterally  Mouth: no erythema or exudates, MMM  Eyes: PERRL, EOMI, conjunctivae normal, No scleral icterus.  Neck: Supple, Trachea midline normal ROM, No JVD, mass, thyromegaly, or carotid bruit present.  Cardiovascular: RRR, S1 normal, S2 normal, no MRG, pulses symmetric and intact bilaterally  Pulmonary/Chest: CTAB, no wheezes, rales, or rhonchi  Abdominal: Soft. Non-tender, non-distended, bowel sounds are normal, no masses, organomegaly, or guarding present.  GU: no CVA tenderness Musculoskeletal: No joint deformities, L. Hip ROM decreased. Patient not willing to lift leg at this time or move too much secondary to complaints of pain  Ext:+1 edema and no cyanosis, pulses palpable bilaterally (DP and PT)  Hematology: no cervical, inginal, or axillary adenopathy.  Neurological: A&O x3, Strenght is normal and symmetric bilaterally, cranial nerve II-XII are grossly intact, no focal motor deficit, sensory intact to light touch bilaterally.  Skin: Warm, dry and intact. No rash, cyanosis, or clubbing.  Psychiatric: Normal mood and affect. speech and behavior is normal. Judgment and thought content normal. Cognition and memory are normal.      Data Review   Micro Results No  results found for this or any previous visit (from the past 240 hour(s)).  Radiology Reports Ct Hip Left Wo Contrast  07/06/2015  CLINICAL DATA:  Acute onset of left hip pain. Inability to walk. Possible septic arthritis. Initial encounter. EXAM: CT OF THE LEFT HIP WITHOUT CONTRAST TECHNIQUE: Multidetector CT imaging of the left hip was performed according to the standard protocol. Multiplanar CT image reconstructions were also generated. COMPARISON:  Left hip radiographs performed earlier today at 1:53 p.m. FINDINGS: There is no evidence of fracture or dislocation. The left femoral head remains seated at the acetabulum. Mild chronic cortical irregularity is noted at the anterior aspect of the left femoral neck, likely reflecting mild exostosis. The visualized portions of both hip joints are symmetric and unremarkable in appearance, without significant joint space narrowing. No significant joint effusion is seen at the left hip. The visualized portions of the sacroiliac joints are unremarkable. The pubic symphysis is unremarkable in appearance.  The bladder is moderately distended and grossly unremarkable in appearance. No significant soft tissue abnormalities are seen. There is no evidence of significant soft tissue injury. IMPRESSION: No evidence of fracture or dislocation. No significant joint effusion. No definite findings seen to suggest septic arthritis at this time. Electronically Signed   By: Roanna Raider M.D.   On: 07/06/2015 22:01   Dg Hip Unilat With Pelvis 2-3 Views Left  07/06/2015  CLINICAL DATA:  Acute onset left hip pain a few days ago. No known injury. Initial encounter. EXAM: DG HIP (WITH OR WITHOUT PELVIS) 2-3V LEFT COMPARISON:  None. FINDINGS: There is no evidence of hip fracture or dislocation. There is no evidence of arthropathy or other focal bone abnormality. IMPRESSION: Negative exam. Electronically Signed   By: Drusilla Kanner M.D.   On: 07/06/2015 14:07     CBC  Recent  Labs Lab 07/06/15 1352  HGB 12.2  HCT 36.0    Chemistries   Recent Labs Lab 07/06/15 1352 07/06/15 1911  NA 144 142  K 3.9 3.5  CL 106 109  CO2  --  25  GLUCOSE 113* 104*  BUN 37* 23*  CREATININE 1.90* 1.58*  CALCIUM  --  8.5*   ------------------------------------------------------------------------------------------------------------------ CrCl cannot be calculated (Unknown ideal weight.). ------------------------------------------------------------------------------------------------------------------ No results for input(s): HGBA1C in the last 72 hours. ------------------------------------------------------------------------------------------------------------------ No results for input(s): CHOL, HDL, LDLCALC, TRIG, CHOLHDL, LDLDIRECT in the last 72 hours. ------------------------------------------------------------------------------------------------------------------ No results for input(s): TSH, T4TOTAL, T3FREE, THYROIDAB in the last 72 hours.  Invalid input(s): FREET3 ------------------------------------------------------------------------------------------------------------------ No results for input(s): VITAMINB12, FOLATE, FERRITIN, TIBC, IRON, RETICCTPCT in the last 72 hours.  Coagulation profile No results for input(s): INR, PROTIME in the last 168 hours.  No results for input(s): DDIMER in the last 72 hours.  Cardiac Enzymes No results for input(s): CKMB, TROPONINI, MYOGLOBIN in the last 168 hours.  Invalid input(s): CK ------------------------------------------------------------------------------------------------------------------ Invalid input(s): POCBNP   CBG: No results for input(s): GLUCAP in the last 168 hours.        Assessment/Plan Left hip pain with gait disturbance: Acute. Symptoms have evolved over the in the last 24-48 hours. Initial imaging studies of x-ray of the hip was negative. Subsequently a CT scan of the left hip was  performed without contrast showed no significant findings to explain patient's symptoms. -  Admit to MedSurg -  Physical therapy to eval and treat -  Social work for possible need of rehabilitation -  May want to curbside orthopedics in the a.m. regarding next steps  UTI : Patient reports being treated for urinary tract infection by her primary care doctor. UA here on admission seems to be contaminated as 6-30 squamous epithelial cells, 0-5 WBC, many bacteria, negative nitrite, negative leukocyte - Questioned possible in and out cath if patient still complains of symptoms  Acute kidney injury on chronic kidney disease stage III: Patient presents today with an initial creatinine of 1.90. Baseline creatinine upon review of previous hospitalization appears more along the lines of 1-1.2.  - IV fluids - Hold any nephrotoxic agents  Hypertension: stable - Continue amlodipine, atenolol - Held hydrochlorothiazide secondary to acute kidney injury is seen above  Diabetes mellitus type 2 - Held metformin - Continue to monitor and will start sliding scale insulin if blood sugars greater than 140   History of chronic pain  Morbid obesity: likely contributing to patient's decreased mobility and gait issues.  Code Status:   full Family Communication: bedside Disposition Plan: admit   Total time  spent 55 minutes.Greater than 50% of this time was spent in counseling, explanation of diagnosis, planning of further management, and coordination of care  Clydie Braun [facility] Pager [phone]  If 7PM-7AM, please contact night-coverage [email or web address] Password [facility] 07/06/2015, 11:50 PM

## 2015-07-06 NOTE — ED Provider Notes (Signed)
CSN: [id number]     Arrival date & time 07/06/15  1024 History   First MD Initiated Contact with Patient 07/06/15 1231     Chief Complaint  Patient presents with  . Hip Pain    HPI   54 year old female with a history of fibromyalgia, hypertension, COPD, chronic back pain, diabetes, vitamin D deficiency, anemia, recurrent headaches( which she's had seen Dr.Jaffe Garnet [place] neurology ) presents today with numerous complaints. Patient's most concerning complaint today is her left leg back and hip pain. Patient states symptoms started 2 days ago with pain in her left lower back, reports that when she woke this morning the pain was in her left hip and upper thigh. She notes it was so severe that she was unable to stand this morning. She reports pain is worse with any movements of the torso or left lower extremity. She denies any loss of distal sensation strength or motor function. She reports that she was seen 3 days ago by primary care for back pain and diagnosed with a spasm. She was given medications including pain medication and anti-spasmodics that only temporary relief the symptoms. Patient initially denies any similar complaints, chart review shows that she's been seen numerous times for leg pain, weakness. Patient additionally notes that she is having headache and dizziness. She reports that these have been present for an extended period of time, has been seen by neurology for the same. She states that when seen by her primary care she had a urinalysis that showed urinary tract infection, she was started on antibiotics at that time. She also noticed belly discomfort, diffuse, persistent for many months with associated diarrhea with certain foods, she is being followed by gastroenterology for this.  Brief chart review shows that she has had for MRI studies in the last 2 years, 6 CT studies in the last 2 years.    Past Medical History  Diagnosis Date  . Hypertension   . Stroke (HCC)   . Diabetes  mellitus without complication (HCC)   . Anemia   . Fibromyalgia   . Chronic pain   . Right sided weakness   . Vitamin deficiency     Vit D  . Hyperlipemia   . Anemia   . Insomnia   . Neuropathy [facility])    Past Surgical History  Procedure Laterality Date  . Leg surgery Right 2007    Surgery x3  . Oophorectomy    . Knee surgery  1992, 1995, 1997   Family History  Problem Relation Age of Onset  . Leukemia Brother   . Breast cancer Mother   . Breast cancer Maternal Grandmother    Social History  Substance Use Topics  . Smoking status: Never Smoker   . Smokeless tobacco: Never Used  . Alcohol Use: No   OB History    No data available     Review of Systems  All other systems reviewed and are negative.   Allergies  Morphine and related  Home Medications   Prior to Admission medications   Medication Sig Start Date End Date Taking? Authorizing Provider  albuterol (PROVENTIL HFA;VENTOLIN HFA) 108 (90 BASE) MCG/ACT inhaler Inhale 2 puffs into the lungs every 6 (six) hours as needed for wheezing.   Yes Historical Provider, MD  amLODipine (NORVASC) 10 MG tablet Take 10 mg by mouth every morning.    Yes Historical Provider, MD  atenolol (TENORMIN) 50 MG tablet Take 50 mg by mouth daily. 11/29/14  Yes Historical Provider,  MD  BELSOMRA 15 MG TABS Take 15 mg by mouth at bedtime. 09/13/14  Yes Historical Provider, MD  ciprofloxacin (CIPRO) 500 MG tablet Take 1 tablet by mouth 2 (two) times daily. 06/29/15  Yes Historical Provider, MD  ferrous sulfate 325 (65 FE) MG tablet Take 325 mg by mouth daily. 09/13/14  Yes Historical Provider, MD  hydrochlorothiazide (HYDRODIURIL) 25 MG tablet Take 25 mg by mouth daily. 09/13/14  Yes Historical Provider, MD  metFORMIN (GLUCOPHAGE) 500 MG tablet Take 500 mg by mouth daily. 09/13/14  Yes Historical Provider, MD  pregabalin (LYRICA) 50 MG capsule Take 50 mg by mouth daily.   Yes Historical Provider, MD  traZODone (DESYREL) 100 MG tablet Take 100 mg by  mouth at bedtime. 09/07/14  Yes Historical Provider, MD  Dexlansoprazole 30 MG capsule Take 30 mg by mouth daily.    Historical Provider, MD   BP 107/70 mmHg  Pulse 88  Temp(Src) 98.7 F (37.1 C) (Oral)  Resp 19  Ht 5\' 6"  (1.676 m)  SpO2 95%   Physical Exam  Constitutional: She is oriented to person, place, and time. She appears well-developed and well-nourished.  HENT:  Head: Normocephalic and atraumatic.  Eyes: Conjunctivae are normal. Pupils are equal, round, and reactive to light. Right eye exhibits no discharge. Left eye exhibits no discharge. No scleral icterus.  Neck: Normal range of motion. No JVD present. No tracheal deviation present.  Pulmonary/Chest: Effort normal. No stridor.  Neurological: She is alert and oriented to person, place, and time. Coordination normal.  Skin: Skin is warm and dry. No rash noted. No erythema. No pallor.  Psychiatric: She has a normal mood and affect. Her behavior is normal. Judgment and thought content normal.  Nursing note and vitals reviewed.     ED Course  Procedures (including critical care time) Labs Review Labs Reviewed  URINALYSIS, ROUTINE W REFLEX MICROSCOPIC ([facility]) - Abnormal; Notable for the following:    APPearance CLOUDY (*)    Hgb urine dipstick SMALL (*)    All other components within normal limits  URINE MICROSCOPIC-ADD ON - Abnormal; Notable for the following:    Squamous Epithelial / LPF 6-30 (*)    Bacteria, UA MANY (*)    All other components within normal limits  I-STAT CHEM 8, ED - Abnormal; Notable for the following:    BUN 37 (*)    Creatinine, Ser 1.90 (*)    Glucose, Bld 113 (*)    All other components within normal limits    Imaging Review Dg Hip Unilat With Pelvis 2-3 Views Left  07/06/2015  CLINICAL DATA:  Acute onset left hip pain a few days ago. No known injury. Initial encounter. EXAM: DG HIP (WITH OR WITHOUT PELVIS) 2-3V LEFT COMPARISON:  None. FINDINGS: There is no evidence of hip fracture  or dislocation. There is no evidence of arthropathy or other focal bone abnormality. IMPRESSION: Negative exam. Electronically Signed   By: Drusilla Kannerhomas  Dalessio M.D.   On: 07/06/2015 14:07   I have personally reviewed and evaluated these images and lab results as part of my medical decision-making.   EKG Interpretation None      MDM   Final diagnoses:  Hip pain    Labs: Urinalysis, i-STAT Chem-8- creatinine 1.9  Imaging: DG hip, CT  Consults:  Therapeutics: Fentanyl, Dilaudid  Discharge Meds:   Assessment/Plan: Patient presents with numerous complaints. Main complaint today is left hip pain causing difficulty with ambulation. Patient reports she was unable to ambulate  at home. In the ED she was able to stand and pivot to the commode here. Due to significant complaints of pain further evaluation necessary with CT scan. Patient also has neurological complaints, these have been sufficiently worked up previously with low suspicion for any changes in her baseline chronic conditions. Patient care is signed off to Harle Stanford MD at shift change pending CT scan, her pain management and disposition. Patient care shared with Remi Haggard M.D. personally evaluated the patient.         Eyvonne Mechanic, PA-C 07/06/15 1633  Doug Sou, MD 07/06/15 [id number]

## 2015-07-06 NOTE — ED Notes (Signed)
MD jacubowitz at bedside.

## 2015-07-06 NOTE — ED Notes (Signed)
Returned from CT.

## 2015-07-06 NOTE — ED Provider Notes (Signed)
Pt taken over from [employer] Hedges at 1600.  Pt complains of upper extremity weakness and numbness which has had extensive evaluation including numerous MRIs without obvious cause of symptoms.  Symptoms are not being evaluated further today.  Pt here today due to left hip pain and back pain.  Pt seen by PCP and given pain medication and muscle relaxers for this condition.  Pt reports inability to stand today due to pain.  No fracture on X-ray.  No signs of septic arthritis on examination but will obtain CT scan to further evaluate joint.  Pt able to stand and pivot on evaluation.  If CT scan without significant abnormality will need to assess ability to ambulate and if able to ambulate will be able to be discharged at that time.    CT scan without obvious abnormality and no sign of septic arthritis.  Pt unable to ambulate and at this point will be admitted to medicine for further management as patient lives alone.  Stable at time of admission.  Stacy GardnerAndrew Rylen Hou, MD 07/06/15 [id number]  Dione Boozeavid Glick, MD 07/06/15 [phone]

## 2015-07-07 DIAGNOSIS — S37009A Unspecified injury of unspecified kidney, initial encounter: Secondary | ICD-10-CM | POA: Diagnosis present

## 2015-07-07 DIAGNOSIS — E118 Type 2 diabetes mellitus with unspecified complications: Secondary | ICD-10-CM | POA: Diagnosis not present

## 2015-07-07 DIAGNOSIS — I10 Essential (primary) hypertension: Secondary | ICD-10-CM

## 2015-07-07 DIAGNOSIS — M25552 Pain in left hip: Secondary | ICD-10-CM | POA: Diagnosis not present

## 2015-07-07 DIAGNOSIS — N179 Acute kidney failure, unspecified: Secondary | ICD-10-CM | POA: Diagnosis present

## 2015-07-07 DIAGNOSIS — R269 Unspecified abnormalities of gait and mobility: Secondary | ICD-10-CM

## 2015-07-07 DIAGNOSIS — N189 Chronic kidney disease, unspecified: Secondary | ICD-10-CM

## 2015-07-07 DIAGNOSIS — G8929 Other chronic pain: Secondary | ICD-10-CM | POA: Diagnosis not present

## 2015-07-07 LAB — GLUCOSE, CAPILLARY
GLUCOSE-CAPILLARY: 127 mg/dL — AB (ref 65–99)
GLUCOSE-CAPILLARY: 86 mg/dL (ref 65–99)
Glucose-Capillary: 89 mg/dL (ref 65–99)
Glucose-Capillary: 115 mg/dL — ABNORMAL HIGH (ref 65–99)

## 2015-07-07 LAB — IRON AND TIBC
IRON: 71 ug/dL (ref 28–170)
Saturation Ratios: 28 % (ref 10.4–31.8)
TIBC: 253 ug/dL (ref 250–450)
UIBC: 182 ug/dL

## 2015-07-07 LAB — FERRITIN: FERRITIN: 112 ng/mL (ref 11–307)

## 2015-07-07 LAB — CBC
HCT: 30.7 % — ABNORMAL LOW (ref 36.0–46.0)
Hemoglobin: 9.8 g/dL — ABNORMAL LOW (ref 12.0–15.0)
MCH: 28.6 pg (ref 26.0–34.0)
MCHC: 31.9 g/dL (ref 30.0–36.0)
MCV: 89.5 fL (ref 78.0–100.0)
PLATELETS: 260 10*3/uL (ref 150–400)
RBC: 3.43 MIL/uL — AB (ref 3.87–5.11)
RDW: 14.7 % (ref 11.5–15.5)
WBC: 9.2 10*3/uL (ref 4.0–10.5)

## 2015-07-07 LAB — VITAMIN B12: VITAMIN B 12: 552 pg/mL (ref 180–914)

## 2015-07-07 LAB — BASIC METABOLIC PANEL
ANION GAP: 7 (ref 5–15)
BUN: 17 mg/dL (ref 6–20)
CALCIUM: 8.2 mg/dL — AB (ref 8.9–10.3)
CO2: 27 mmol/L (ref 22–32)
Chloride: 108 mmol/L (ref 101–111)
Creatinine, Ser: 1.29 mg/dL — ABNORMAL HIGH (ref 0.44–1.00)
GFR calc Af Amer: 54 mL/min — ABNORMAL LOW (ref >60)
GFR, EST NON AFRICAN AMERICAN: 46 mL/min — AB (ref >60)
GLUCOSE: 123 mg/dL — AB (ref 65–99)
POTASSIUM: 3.3 mmol/L — AB (ref 3.5–5.1)
SODIUM: 142 mmol/L (ref 135–145)

## 2015-07-07 LAB — RETICULOCYTES
RBC.: 3.45 MIL/uL — ABNORMAL LOW (ref 3.87–5.11)
RETIC COUNT ABSOLUTE: 72.5 10*3/uL (ref 19.0–186.0)
RETIC CT PCT: 2.1 % (ref 0.4–3.1)

## 2015-07-07 LAB — FOLATE: Folate: 9.2 ng/mL (ref >5.9)

## 2015-07-07 MED ORDER — AMLODIPINE BESYLATE 10 MG PO TABS
10.0000 mg | ORAL_TABLET | Freq: Every morning | ORAL | Status: DC
  Administered 2015-07-08 – 2015-07-09 (×2): 10 mg via ORAL
  Filled 2015-07-07 (×3): qty 1

## 2015-07-07 MED ORDER — ONDANSETRON HCL 4 MG PO TABS: 4.0000 mg | ORAL_TABLET | Freq: Four times a day (QID) | ORAL | Status: DC | PRN

## 2015-07-07 MED ORDER — TRAZODONE HCL 100 MG PO TABS
100.0000 mg | ORAL_TABLET | Freq: Every day | ORAL | Status: DC
  Administered 2015-07-07 – 2015-07-08 (×3): 100 mg via ORAL
  Filled 2015-07-07 (×3): qty 1

## 2015-07-07 MED ORDER — ACETAMINOPHEN 650 MG RE SUPP: 650.0000 mg | Freq: Four times a day (QID) | RECTAL | Status: DC | PRN

## 2015-07-07 MED ORDER — FERROUS SULFATE 325 (65 FE) MG PO TABS
325.0000 mg | ORAL_TABLET | Freq: Every day | ORAL | Status: DC
  Administered 2015-07-07 – 2015-07-09 (×3): 325 mg via ORAL
  Filled 2015-07-07 (×3): qty 1

## 2015-07-07 MED ORDER — SODIUM CHLORIDE 0.9 % IJ SOLN
3.0000 mL | Freq: Two times a day (BID) | INTRAMUSCULAR | Status: DC
  Administered 2015-07-07 – 2015-07-08 (×2): 3 mL via INTRAVENOUS

## 2015-07-07 MED ORDER — ALBUTEROL SULFATE (2.5 MG/3ML) 0.083% IN NEBU
3.0000 mL | INHALATION_SOLUTION | Freq: Four times a day (QID) | RESPIRATORY_TRACT | Status: DC | PRN
  Administered 2015-07-08: 3 mL via RESPIRATORY_TRACT
  Filled 2015-07-07: qty 3

## 2015-07-07 MED ORDER — PREGABALIN 25 MG PO CAPS
50.0000 mg | ORAL_CAPSULE | Freq: Every day | ORAL | Status: DC
  Administered 2015-07-07 – 2015-07-09 (×3): 50 mg via ORAL
  Filled 2015-07-07 (×3): qty 2

## 2015-07-07 MED ORDER — SUVOREXANT 15 MG PO TABS: 15.0000 mg | ORAL_TABLET | Freq: Every day | ORAL | Status: DC

## 2015-07-07 MED ORDER — FENTANYL 25 MCG/HR TD PT72
25.0000 ug | MEDICATED_PATCH | TRANSDERMAL | Status: DC
  Administered 2015-07-07: 25 ug via TRANSDERMAL
  Filled 2015-07-07: qty 1

## 2015-07-07 MED ORDER — ENOXAPARIN SODIUM 80 MG/0.8ML ~~LOC~~ SOLN
0.5000 mg/kg | SUBCUTANEOUS | Status: DC
  Administered 2015-07-07 – 2015-07-08 (×2): 75 mg via SUBCUTANEOUS
  Filled 2015-07-07 (×2): qty 0.8

## 2015-07-07 MED ORDER — POTASSIUM CHLORIDE CRYS ER 20 MEQ PO TBCR
40.0000 meq | EXTENDED_RELEASE_TABLET | Freq: Once | ORAL | Status: AC
  Administered 2015-07-07: 40 meq via ORAL
  Filled 2015-07-07: qty 2

## 2015-07-07 MED ORDER — ACETAMINOPHEN 325 MG PO TABS: 650.0000 mg | ORAL_TABLET | Freq: Four times a day (QID) | ORAL | Status: DC | PRN

## 2015-07-07 MED ORDER — ATENOLOL 50 MG PO TABS
50.0000 mg | ORAL_TABLET | Freq: Every day | ORAL | Status: DC
  Administered 2015-07-07 – 2015-07-09 (×3): 50 mg via ORAL
  Filled 2015-07-07 (×3): qty 1

## 2015-07-07 MED ORDER — ONDANSETRON HCL 4 MG/2ML IJ SOLN: 4.0000 mg | Freq: Four times a day (QID) | INTRAMUSCULAR | Status: DC | PRN

## 2015-07-07 MED ORDER — ZOLPIDEM TARTRATE 5 MG PO TABS
5.0000 mg | ORAL_TABLET | Freq: Every day | ORAL | Status: DC
  Administered 2015-07-07 – 2015-07-08 (×2): 5 mg via ORAL
  Filled 2015-07-07 (×2): qty 1

## 2015-07-07 MED ORDER — SODIUM CHLORIDE 0.9 % IV SOLN
Freq: Once | INTRAVENOUS | Status: AC
  Administered 2015-07-07: 01:00:00 via INTRAVENOUS

## 2015-07-07 MED ORDER — OXYCODONE HCL 5 MG PO TABS
5.0000 mg | ORAL_TABLET | Freq: Four times a day (QID) | ORAL | Status: DC | PRN
  Administered 2015-07-07 – 2015-07-09 (×9): 5 mg via ORAL
  Filled 2015-07-07 (×9): qty 1

## 2015-07-07 MED ORDER — ENOXAPARIN SODIUM 40 MG/0.4ML ~~LOC~~ SOLN
40.0000 mg | SUBCUTANEOUS | Status: DC
  Administered 2015-07-07: 40 mg via SUBCUTANEOUS
  Filled 2015-07-07: qty 0.4

## 2015-07-07 NOTE — Progress Notes (Signed)
[facility]                                                                              Patient Demographics  Yesenia [name], is a 54 y.o. female, DOB - Sep 22, 1960, UVO:[id number]  Admit date - 07/06/2015   Admitting Physician Clydie Braun, MD  Outpatient Primary MD for the patient is [name], [name], NP  LOS - 1   Chief Complaint  Patient presents with  . Hip Pain      HPI on 07/06/2015 by Dr. Madelyn Flavors Yesenia [name] is a 54 year old female with a past medical history significant for HTN, HLD, fibromyalgia, chronic pain, CKD, neuropathy, previous history of stroke, migraines symptoms started 2 days ago with pain on the left lower side of her back, and upon awaking this morning pain was in her left hip and upper thigh. Pain is worsened by any movement on that side. Review previous records. Patient has had similar complaints in the past with dealing with weakness, headaches, and dizziness. She notes inability to ambulate due to the significance of the pain on the left and now states that she is starting to have pain in the right leg as well. Also notes recent treatment for urinary tract infection with ciprofloxacin which she had completed everything except for 4 pills on the medications.  Upon admission into the ED, patient was evaluated with a x-ray which showed no acute abnormalities and then followed up with a CT scan which also showed no signs of septic arthritis. Patient unable to ambulate and she states that she lives alone and has no way of care for herself.  Assessment & Plan   Left hip pain with gait disturbance -Began approximately 2-3 days ago. Patient has been unable to get around. -X-ray of the left hip unremarkable, CT scan left hip: No evidence of fracture or dislocation, joint No evidence of fracture or dislocation, joint effusion, no findings of septic arthritis -PT and OT consulted for evaluation and treatment  Urinary tract infection -Patient states she  was recently treated for urinary tract infection by her primary physician. -UA upon admission shows 6-30 squamous epithelial cells, 0-5 WBC, many bacteria, negative nitrites and leukocytes -Patient currently afebrile with no leukocytosis, will not start antibiotics at this time  Acute on chronic kidney disease, stage III -Upon admission, creatinine 1.9, baseline appears to be 1.2 -Currently creatinine 1.29 -Continue to monitor BMP  Essential hypertension -Continue amlodipine, atenolol -HCTZ held due to AKI  Diabetes mellitus, type II -Metformin held -Continue insulin sliding scale CBG monitoring  Morbid obesity -Likely adding to her left hip and back pain. -Patient will need follow-up with her primary care physician upon discharge to discuss lifestyle modifications including diet and exercise program, or other options  Hypokalemia -Will replace and continue to monitor BMP  Normocytic anemia -Drop in hemoglobin to 9.8, baseline appears to be approximately 11 -Continue to monitor CBC  Code Status: Full  Family Communication: None at bedside.  Disposition Plan: Admitted for observation.  Pending PT/OT consults  Time Spent in minutes   30 minutes  Procedures  None  Consults   None  DVT Prophylaxis  Lovenox  Lab Results  Component Value Date   PLT 260 07/07/2015    Medications  Scheduled Meds: . amLODipine  10 mg Oral q morning - 10a  . atenolol  50 mg Oral Daily  . enoxaparin (LOVENOX) injection  0.5 mg/kg Subcutaneous Q24H  . fentaNYL  25 mcg Transdermal Q72H  . ferrous sulfate  325 mg Oral Daily  . pregabalin  50 mg Oral Daily  . sodium chloride  3 mL Intravenous Q12H  . Suvorexant  15 mg Oral QHS  . traZODone  100 mg Oral QHS   Continuous Infusions:  PRN Meds:.acetaminophen **OR** acetaminophen, albuterol, ondansetron **OR** ondansetron (ZOFRAN) IV, oxyCODONE  Antibiotics    Anti-infectives    None      Subjective:   Yesenia [name] seen and  examined today.  Patient continues to complain of left hip and back pain with the inability to walk.  She does not feel she can take care of herself at home.  Denies dizziness, chest pain, shortness of breath, abdominal pain.  Admits to having problems with urge incontinence and occasional loose stools.  Objective:   Filed Vitals:   07/07/15 0605 07/07/15 1000 07/07/15 1010 07/07/15 1342  BP: 91/53 87/61 115/72   Pulse: 92   80  Temp: 98.9 F (37.2 C)   98.8 F (37.1 C)  TempSrc: Oral     Resp: 17   16  Height:      Weight:      SpO2: 97%   98%    Wt Readings from Last 3 Encounters:  07/07/15 151.3 kg (333 lb 8.9 oz)  05/14/15 149.914 kg (330 lb 8 oz)  12/18/14 156.491 kg (345 lb)     Intake/Output Summary (Last 24 hours) at 07/07/15 1350 Last data filed at 07/07/15 0900  Gross per 24 hour  Intake    240 ml  Output      0 ml  Net    240 ml    Exam  General: Well developed, morbidly obese, NAD, appears stated age  HEENT: NCAT,  mucous membranes moist.   Cardiovascular: S1 S2 auscultated, no rubs, murmurs or gallops. Regular rate and rhythm.  Respiratory: Clear to auscultation bilaterally with equal chest rise  Abdomen: Soft, obese, nontender, nondistended, + bowel sounds  Extremities: warm dry without cyanosis clubbing. +1 edema in LE B/L  Neuro: AAOx3, nonfocal  Psych: Normal affect and demeanor  Data Review   Micro Results No results found for this or any previous visit (from the past 240 hour(s)).  Radiology Reports Ct Hip Left Wo Contrast  07/06/2015  CLINICAL DATA:  Acute onset of left hip pain. Inability to walk. Possible septic arthritis. Initial encounter. EXAM: CT OF THE LEFT HIP WITHOUT CONTRAST TECHNIQUE: Multidetector CT imaging of the left hip was performed according to the standard protocol. Multiplanar CT image reconstructions were also generated. COMPARISON:  Left hip radiographs performed earlier today at 1:53 p.m. FINDINGS: There is no  evidence of fracture or dislocation. The left femoral head remains seated at the acetabulum. Mild chronic cortical irregularity is noted at the anterior aspect of the left femoral neck, likely reflecting mild exostosis. The visualized portions of both hip joints are symmetric and unremarkable in appearance, without significant joint space narrowing. No significant joint effusion is seen at the left hip. The visualized portions of the sacroiliac joints are unremarkable. The pubic symphysis is unremarkable in appearance. The bladder is moderately distended and grossly unremarkable in appearance. No significant soft tissue abnormalities are seen. There is  no evidence of significant soft tissue injury. IMPRESSION: No evidence of fracture or dislocation. No significant joint effusion. No definite findings seen to suggest septic arthritis at this time. Electronically Signed   By: Roanna Raider M.D.   On: 07/06/2015 22:01   Dg Hip Unilat With Pelvis 2-3 Views Left  07/06/2015  CLINICAL DATA:  Acute onset left hip pain a few days ago. No known injury. Initial encounter. EXAM: DG HIP (WITH OR WITHOUT PELVIS) 2-3V LEFT COMPARISON:  None. FINDINGS: There is no evidence of hip fracture or dislocation. There is no evidence of arthropathy or other focal bone abnormality. IMPRESSION: Negative exam. Electronically Signed   By: Drusilla Kanner M.D.   On: 07/06/2015 14:07    CBC  Recent Labs Lab 07/06/15 1352 07/07/15 0450  WBC  --  9.2  HGB 12.2 9.8*  HCT 36.0 30.7*  PLT  --  260  MCV  --  89.5  MCH  --  28.6  MCHC  --  31.9  RDW  --  14.7    Chemistries   Recent Labs Lab 07/06/15 1352 07/06/15 1911 07/07/15 0450  NA 144 142 142  K 3.9 3.5 3.3*  CL 106 109 108  CO2  --  25 27  GLUCOSE 113* 104* 123*  BUN 37* 23* 17  CREATININE 1.90* 1.58* 1.29*  CALCIUM  --  8.5* 8.2*    ------------------------------------------------------------------------------------------------------------------ estimated creatinine clearance is 76.5 mL/min (by C-G formula based on Cr of 1.29). ------------------------------------------------------------------------------------------------------------------ No results for input(s): HGBA1C in the last 72 hours. ------------------------------------------------------------------------------------------------------------------ No results for input(s): CHOL, HDL, LDLCALC, TRIG, CHOLHDL, LDLDIRECT in the last 72 hours. ------------------------------------------------------------------------------------------------------------------ No results for input(s): TSH, T4TOTAL, T3FREE, THYROIDAB in the last 72 hours.  Invalid input(s): FREET3 ------------------------------------------------------------------------------------------------------------------  Recent Labs  07/07/15 0845  VITAMINB12 552  FOLATE 9.2  FERRITIN 112  TIBC 253  IRON 71  RETICCTPCT 2.1    Coagulation profile No results for input(s): INR, PROTIME in the last 168 hours.  No results for input(s): DDIMER in the last 72 hours.  Cardiac Enzymes No results for input(s): CKMB, TROPONINI, MYOGLOBIN in the last 168 hours.  Invalid input(s): CK ------------------------------------------------------------------------------------------------------------------ Invalid input(s): POCBNP    Yesenia [name] D.O. on 07/07/2015 at 1:50 PM  Between 7am to 7pm - Pager - [phone]  After 7pm go to [email or web address] - password [email or web address]  And look for the night coverage person covering for me after hours  [facility] Office  [phone]

## 2015-07-07 NOTE — Evaluation (Signed)
Physical Therapy Evaluation Patient Details Name: Samanth Mirkin MRN: [id number] DOB: 1960-08-28 Today's Date: 07/07/2015   History of Present Illness  Ms. Virgel Manifold is a 54 year old female with a past medical history significant for HTN, HLD, fibromyalgia, chronic pain, CKD, neuropathy, previous history of stroke, migraines symptoms started 2 days ago with pain on the left lower side of her back, and upon awaking this morning pain was in her left hip and upper thigh. Pain is worsened by any movement on that side, as well as pain R LE  Clinical Impression   Pt admitted with above diagnosis. Pt currently with functional limitations due to the deficits listed below (see PT Problem List).  Pt will benefit from skilled PT to increase their independence and safety with mobility to allow discharge to the venue listed below.       Follow Up Recommendations SNF  Reports she has been to rehab at a SNF before with good results with therapy, she was able to get back to independence in her home; Will need to see if her insurance will cover therapies at [facility]    Equipment Recommendations  Rolling walker with 5" wheels;3in1 (PT) (may already have)    Recommendations for Other Services OT consult     Precautions / Restrictions Precautions Precautions: Fall      Mobility  Bed Mobility Overal bed mobility: Needs Assistance;+2 for physical assistance Bed Mobility: Rolling;Sidelying to Sit Rolling: Mod assist;+2 for physical assistance Sidelying to sit: Mod assist;+2 for physical assistance       General bed mobility comments: Cues to roll as an option for getting up while still working to minimize back pain; +2 to elevate trunk sidelying to sit  Transfers Overall transfer level: Needs assistance Equipment used: Rolling walker (2 wheeled) Transfers: Sit to/from Stand Sit to Stand: Min assist;+2 safety/equipment         General transfer comment: min assist to steady RW in transition to stand up;  min assist and second person for safety to sit back down, uncontrolled descent and required the chair be pushed up behind her  Ambulation/Gait Ambulation/Gait assistance: Min assist;+2 safety/equipment Ambulation Distance (Feet): 5 Feet Assistive device: Rolling walker (2 wheeled) Gait Pattern/deviations: Decreased step length - right;Decreased step length - left     General Gait Details: Heavy lean on RW; very short steps; limited by pain  Stairs            Wheelchair Mobility    Modified Rankin (Stroke Patients Only)       Balance Overall balance assessment: Needs assistance           Standing balance-Leahy Scale: Poor                               Pertinent Vitals/Pain Pain Assessment: 0-10 Pain Score: 9  Pain Location: R lower back and L outer thigh Pain Descriptors / Indicators: Aching;Constant Pain Intervention(s): Limited activity within patient's tolerance;Monitored during session;Premedicated before session;Repositioned    Home Living Family/patient expects to be discharged to:: Private residence Living Arrangements: Other relatives Available Help at Discharge: Available PRN/intermittently Type of Home: Apartment Home Access: Stairs to enter   Secretary/administrator of Steps: 12 Home Layout: One level Home Equipment:  (to be determined) Additional Comments: Very focused on the act of moving, and her pain; unable to answer questions about home equipment once we began moving    Prior Function Level of Independence: Independent with assistive  device(s)         Comments: Need more information re: prior level of function     Hand Dominance        Extremity/Trunk Assessment   Upper Extremity Assessment: Generalized weakness           Lower Extremity Assessment: Generalized weakness (Did not formally Manual Muscle test)         Communication   Communication: No difficulties  Cognition Arousal/Alertness:  Awake/alert Behavior During Therapy: WFL for tasks assessed/performed Overall Cognitive Status: Within Functional Limits for tasks assessed                      General Comments      Exercises        Assessment/Plan    PT Assessment Patient needs continued PT services  PT Diagnosis Difficulty walking;Generalized weakness;Acute pain   PT Problem List Decreased strength;Decreased range of motion;Decreased activity tolerance;Decreased balance;Decreased mobility;Decreased coordination;Decreased knowledge of use of DME;Decreased safety awareness;Decreased knowledge of precautions;Pain;Obesity  PT Treatment Interventions DME instruction;Gait training;Stair training;Functional mobility training;Therapeutic activities;Therapeutic exercise;Patient/family education   PT Goals (Current goals can be found in the Care Plan section) Acute Rehab PT Goals Patient Stated Goal: less pain PT Goal Formulation: With patient Time For Goal Achievement: 07/21/15 Potential to Achieve Goals: Good    Frequency Min 3X/week   Barriers to discharge Inaccessible home environment;Decreased caregiver support Lives alone and has a flight of steps to enter her home;     Co-evaluation               End of Session Equipment Utilized During Treatment: Gait belt Activity Tolerance: Patient limited by pain Patient left: in chair;with call bell/phone within reach Nurse Communication: Mobility status    Functional Assessment Tool Used: Clinical Judgement Functional Limitation: Mobility: Walking and moving around Mobility: Walking and Moving Around Current Status ([id number](G8978): At least 20 percent but less than 40 percent impaired, limited or restricted Mobility: Walking and Moving Around Goal Status [phone](G8979): 0 percent impaired, limited or restricted    Time: 1343-1356 PT Time Calculation (min) (ACUTE ONLY): 13 min   Charges:   PT Evaluation $Initial PT Evaluation Tier I: 1 Procedure     PT G  Codes:   PT G-Codes **NOT FOR INPATIENT CLASS** Functional Assessment Tool Used: Clinical Judgement Functional Limitation: Mobility: Walking and moving around Mobility: Walking and Moving Around Current Status (U9811(G8978): At least 20 percent but less than 40 percent impaired, limited or restricted Mobility: Walking and Moving Around Goal Status [phone](G8979): 0 percent impaired, limited or restricted    Van ClinesGarrigan, Krystle Polcyn [facility] P.[facility].F.amff 07/07/2015, 3:47 PM  Van ClinesHolly Lielle Vandervort, PT  Acute Rehabilitation Services Pager [phone] Office [phone]

## 2015-07-08 ENCOUNTER — Encounter (HOSPITAL_COMMUNITY): Payer: Self-pay | Admitting: General Practice

## 2015-07-08 DIAGNOSIS — M25552 Pain in left hip: Secondary | ICD-10-CM | POA: Diagnosis not present

## 2015-07-08 DIAGNOSIS — N179 Acute kidney failure, unspecified: Secondary | ICD-10-CM | POA: Diagnosis not present

## 2015-07-08 DIAGNOSIS — E118 Type 2 diabetes mellitus with unspecified complications: Secondary | ICD-10-CM | POA: Diagnosis not present

## 2015-07-08 DIAGNOSIS — G8929 Other chronic pain: Secondary | ICD-10-CM | POA: Diagnosis not present

## 2015-07-08 LAB — CBC
HCT: 33.6 % — ABNORMAL LOW (ref 36.0–46.0)
Hemoglobin: 11 g/dL — ABNORMAL LOW (ref 12.0–15.0)
MCH: 29 pg (ref 26.0–34.0)
MCHC: 32.7 g/dL (ref 30.0–36.0)
MCV: 88.7 fL (ref 78.0–100.0)
PLATELETS: 276 10*3/uL (ref 150–400)
RBC: 3.79 MIL/uL — AB (ref 3.87–5.11)
RDW: 14.6 % (ref 11.5–15.5)
WBC: 8.6 10*3/uL (ref 4.0–10.5)

## 2015-07-08 LAB — BASIC METABOLIC PANEL
ANION GAP: 11 (ref 5–15)
BUN: 17 mg/dL (ref 6–20)
CALCIUM: 8.7 mg/dL — AB (ref 8.9–10.3)
CO2: 23 mmol/L (ref 22–32)
Chloride: 109 mmol/L (ref 101–111)
Creatinine, Ser: 1.2 mg/dL — ABNORMAL HIGH (ref 0.44–1.00)
GFR calc Af Amer: 59 mL/min — ABNORMAL LOW (ref >60)
GFR, EST NON AFRICAN AMERICAN: 51 mL/min — AB (ref >60)
Glucose, Bld: 196 mg/dL — ABNORMAL HIGH (ref 65–99)
POTASSIUM: 3.9 mmol/L (ref 3.5–5.1)
SODIUM: 143 mmol/L (ref 135–145)

## 2015-07-08 LAB — GLUCOSE, CAPILLARY
GLUCOSE-CAPILLARY: 113 mg/dL — AB (ref 65–99)
GLUCOSE-CAPILLARY: 105 mg/dL — AB (ref 65–99)
Glucose-Capillary: 91 mg/dL (ref 65–99)
Glucose-Capillary: 98 mg/dL (ref 65–99)

## 2015-07-08 NOTE — Evaluation (Signed)
Occupational Therapy Evaluation Patient Details Name: Yesenia [name] MRN: [id number] DOB: 06/29/1961 Today's Date: 07/08/2015    History of Present Illness Yesenia [name] is a 54 year old female with a past medical history significant for HTN, HLD, fibromyalgia, chronic pain, CKD, neuropathy, previous history of stroke, migraines symptoms started 2 days ago with pain on the left lower side of her back, and upon awaking this morning pain was in her left hip and upper thigh. Pain is worsened by any movement on that side, as well as pain R LE   Clinical Impression   Pt reports she was independent with ADLs and mobility PTA. Currently pt is min assist for stand pivot transfers and mod-max assist overall for LB ADLs. Recommending d/c to SNF for further rehab prior to returning home due to current level of participation in ADLs and decreased functional mobility. Pt would benefit from continued skilled OT in order to maximize independence and safety with LB ADLs, ADLs in standing, toilet and tub transfers.    Follow Up Recommendations  SNF;Supervision/Assistance - 24 hour    Equipment Recommendations  3 in 1 bedside comode;Tub/shower bench;Other (comment) (AE: lh shoe horn/lh sponge/reacher/sock aide. Bariatric 3in1)    Recommendations for Other Services       Precautions / Restrictions Precautions Precautions: Fall Restrictions Weight Bearing Restrictions: No      Mobility Bed Mobility Overal bed mobility: Needs Assistance Bed Mobility: Supine to Sit     Supine to sit: Min guard;HOB elevated     General bed mobility comments: HOB elevated maximally. VCs needed throughout for hand placement and technique. Increased time required.  Transfers Overall transfer level: Needs assistance Equipment used: Rolling walker (2 wheeled) Transfers: Sit to/from [employer] Sit to Stand: Min assist Stand pivot transfers: Min assist       General transfer comment: Min assist to  boost up and for balance in standing. VCs required for hand placement, technique, and controlled descent to chair.    Balance Overall balance assessment: Needs assistance Sitting-balance support: Single extremity supported;Feet supported Sitting balance-Leahy Scale: Poor Sitting balance - Comments: Attempted to doff sock sitting EOB; required one UE to be supported to maintain sitting balance   Standing balance support: Bilateral upper extremity supported Standing balance-Leahy Scale: Poor Standing balance comment: RW for support                            ADL Overall ADL's : Needs assistance/impaired Eating/Feeding: Set up;Sitting   Grooming: Set up;Sitting       Lower Body Bathing: Moderate assistance;Sit to/from stand       Lower Body Dressing: Maximal assistance;Sit to/from stand Lower Body Dressing Details (indicate cue type and reason): Pt unable to reach bilateral feet at this time. Toilet Transfer: Minimal Science writer Details (indicate cue type and reason): Simulated by transfer from EOB to chair Toileting- Clothing Manipulation and Hygiene: Maximal assistance;Sit to/from stand       Functional mobility during ADLs: Minimal assistance;Rolling walker (for stand pivot ) General ADL Comments: No family present for OT eval. Educated on safety and use call button, importance on sitting up OOB daily; pt verbalized understanding.     Vision     Perception     Praxis      Pertinent Vitals/Pain Pain Assessment: 0-10 Pain Score: 9  Pain Location: bilateral outter thighs/hips Pain Descriptors / Indicators: Aching;Grimacing;Guarding;Moaning Pain Intervention(s): Limited activity within patient's tolerance;Monitored during session;Repositioned  Hand Dominance     Extremity/Trunk Assessment Upper Extremity Assessment Upper Extremity Assessment: Generalized weakness   Lower Extremity Assessment Lower Extremity  Assessment: Defer to PT evaluation   Cervical / Trunk Assessment Cervical / Trunk Assessment: Kyphotic (reports she "cant stand up straight")   Communication Communication Communication: No difficulties   Cognition Arousal/Alertness: Awake/alert Behavior During Therapy: WFL for tasks assessed/performed Overall Cognitive Status: Within Functional Limits for tasks assessed                     General Comments       Exercises       Shoulder Instructions      Home Living Family/patient expects to be discharged to:: Private residence Living Arrangements: Alone Available Help at Discharge: Family;Available PRN/intermittently Type of Home: Apartment Home Access: Stairs to enter Entrance Stairs-Number of Steps: 12   Home Layout: One level     Bathroom Shower/Tub: Chief Strategy OfficerTub/shower unit   Bathroom Toilet: Standard Bathroom Accessibility: Yes   Home Equipment: Walker - 2 wheels          Prior Functioning/Environment Level of Independence: Independent             OT Diagnosis: Generalized weakness;Acute pain   OT Problem List: Decreased strength;Decreased activity tolerance;Impaired balance (sitting and/or standing);Decreased safety awareness;Decreased knowledge of use of DME or AE;Obesity;Pain   OT Treatment/Interventions: Self-care/ADL training;DME and/or AE instruction;Therapeutic activities;Patient/family education    OT Goals(Current goals can be found in the care plan section) Acute Rehab OT Goals Patient Stated Goal: reduce pain and go to rehab then home OT Goal Formulation: With patient Time For Goal Achievement: 07/22/15 Potential to Achieve Goals: Good ADL Goals Pt Will Perform Grooming: with min guard assist;standing Pt Will Perform Lower Body Bathing: with min guard assist;with adaptive equipment;sit to/from stand Pt Will Perform Lower Body Dressing: with min guard assist;with adaptive equipment;sit to/from stand Pt Will Transfer to Toilet: with min  guard assist;ambulating;bedside commode (BSC over toilet) Pt Will Perform Toileting - Clothing Manipulation and hygiene: with min guard assist;sit to/from stand Pt Will Perform Tub/Shower Transfer: Tub transfer;with min guard assist;ambulating;tub bench;rolling walker  OT Frequency: Min 2X/week   Barriers to D/C: Inaccessible home environment;Decreased caregiver support  Pt lives on second floor of apartment building and only has intermittent supervision upon return home       Co-evaluation              End of Session Equipment Utilized During Treatment: Gait belt;Rolling walker Nurse Communication: Mobility status  Activity Tolerance: Patient limited by pain Patient left: in chair;with call bell/phone within reach   Time: [id number] OT Time Calculation (min): 14 min Charges:  OT General Charges $OT Visit: 1 Procedure OT Evaluation $Initial OT Evaluation Tier I: 1 Procedure G-Codes: OT G-codes **NOT FOR INPATIENT CLASS** Functional Assessment Tool Used: Clinical judgement Functional Limitation: Self care Self Care Current Status (Q6578(G8987): At least 40 percent but less than 60 percent impaired, limited or restricted Self Care Goal Status (I6962(G8988): At least 1 percent but less than 20 percent impaired, limited or restricted   Gaye AlkenBailey A Adalay Azucena M.S., OTR/L Pager: [phone]  07/08/2015, 9:14 AM

## 2015-07-08 NOTE — Clinical Social Work Note (Signed)
Clinical Social Work Assessment  Patient Details  Name: Yesenia StageSonja Codner MRN: [id number] Date of Birth: 23-Mar-1961  Date of referral:  07/08/15               Reason for consult:  Facility Placement, Discharge Planning                Permission sought to share information with:  Oceanographeracility Contact Representative Permission granted to share information::  Yes, Verbal Permission Granted  Name::        Agency::  [place] [place] [place] [place]  Relationship::     Contact Information:     Housing/Transportation Living arrangements for the past 2 months:  Single Family Home Source of Information:  Patient Patient Interpreter Needed:  None Criminal Activity/Legal Involvement Pertinent to Current Situation/Hospitalization:  No - Comment as needed Significant Relationships:  None Lives with:  Self Do you feel safe going back to the place where you live?  No (High fall risk.) Need for family participation in patient care:  No (Coment) (Patient able to make own decisions.)  Care giving concerns:  Patient expressed no concerns at this time.   Social Worker assessment / plan:  CSW received referral for possible SNF placement at time of discharge. Patient expressed understanding of PT recommendation and CSW need to expand SNF search to surrounding counties due to patient's payer source [facility](Medicaid). CSW to continue to follow and assist with discharge planning needs.  Employment status:  Disabled (Comment on whether or not currently receiving Disability) Insurance information:  [place] Recommendations:  Skilled Nursing Facility Information / Referral to community resources:  Skilled Nursing Facility  Patient/Family's Response to care:  Patient understanding and agreeable to CSW plan of care.  Patient/Family's Understanding of and Emotional Response to Diagnosis, Current Treatment, and Prognosis:  Patient understanding and agreeable to CSW plan of care.  Emotional  Assessment Appearance:  Appears stated age Attitude/Demeanor/Rapport:  Other (Pleasant.) Affect (typically observed):  Accepting, Appropriate Orientation:  Oriented to Self, Oriented to  Time, Oriented to Place, Oriented to Situation Alcohol / Substance use:  Not Applicable Psych involvement (Current and /or in the community):  No (Comment) (Not appropriate on this admission.)  Discharge Needs  Concerns to be addressed:  No discharge needs identified Readmission within the last 30 days:  No Current discharge risk:  None Barriers to Discharge:  No Barriers Identified   Rod MaeVaughn, Randale Carvalho S, LCSW 07/08/2015, 3:25 PM [phone]

## 2015-07-08 NOTE — NC FL2 (Signed)
[place] MEDICAID FL2 LEVEL OF CARE SCREENING TOOL     IDENTIFICATION  Patient Name: Yesenia [name] Birthdate: [birth year] Sex: female Admission Date (Current Location): 07/06/2015  [facility] and [place] Number:  Producer, television/film/video and Address:  [facility] [place], [address] [place], [place] [address]      Provider Number: [id number]  Attending Physician Name and Address:  Edsel Petrin, DO  Relative Name and Phone Number:       Current Level of Care: Hospital Recommended Level of Care: Skilled Nursing Facility Prior Approval Number:    Date Approved/Denied:   PASRR Number: [id number]  Discharge Plan: SNF    Current Diagnoses: Patient Active Problem List   Diagnosis Date Noted  . Acute kidney injury superimposed on chronic kidney disease (HCC) 07/07/2015  . Gait disturbance 07/07/2015  . Morbid obesity (HCC) 07/07/2015  . Left hip pain 07/06/2015  . Chronic pain   . Right sided weakness   . Headache(784.0) 04/14/2013  . Encephalopathy acute 04/11/2013  . Acute pyelonephritis 04/11/2013  . Diabetes (HCC) 04/11/2013  . Dyslipidemia 04/11/2013  . CVA (cerebral infarction) 01/14/2013  . HTN (hypertension) 01/14/2013  . Anemia 01/14/2013    Orientation RESPIRATION BLADDER Height & Weight    Self, Time, Situation, Place  Normal Continent  (167.6 cm) 333 lbs.  BEHAVIORAL SYMPTOMS/MOOD NEUROLOGICAL BOWEL NUTRITION STATUS   (n/a)  (n/a) Continent Diet (Please see discharge summary.)  AMBULATORY STATUS COMMUNICATION OF NEEDS Skin   Limited Assist Verbally Normal                       Personal Care Assistance Level of Assistance  Bathing, Feeding, Dressing Bathing Assistance: Independent Feeding assistance: Independent Dressing Assistance: Independent     Functional Limitations Info   (n/a)          SPECIAL CARE FACTORS FREQUENCY  PT (By licensed PT), OT (By licensed OT)     PT Frequency: 5 OT Frequency: 5            Contractures      Additional Factors Info  Code Status, Allergies Code Status Info: FULL Allergies Info: Morphine and Related           Current Medications (07/08/2015):  This is the current hospital active medication list Current Facility-Administered Medications  Medication Dose Route Frequency Provider Last Rate Last Dose  . acetaminophen (TYLENOL) tablet 650 mg  650 mg Oral Q6H PRN Clydie Braun, MD       Or  . acetaminophen (TYLENOL) suppository 650 mg  650 mg Rectal Q6H PRN Clydie Braun, MD      . albuterol (PROVENTIL) (2.5 MG/3ML) 0.083% nebulizer solution 3 mL  3 mL Inhalation Q6H PRN Clydie Braun, MD   3 mL at 07/08/15 0202  . amLODipine (NORVASC) tablet 10 mg  10 mg Oral q morning - 10a Rondell A Smith, MD   10 mg at 07/08/15 1030  . atenolol (TENORMIN) tablet 50 mg  50 mg Oral Daily Rondell Burtis Junes, MD   50 mg at 07/08/15 1030  . enoxaparin (LOVENOX) injection 75 mg  0.5 mg/kg Subcutaneous Q24H Maryann Mikhail, DO   75 mg at 07/07/15 1730  . fentaNYL (DURAGESIC - dosed mcg/hr) patch 25 mcg  25 mcg Transdermal Q72H Clydie Braun, MD   25 mcg at 07/07/15 0052  . ferrous sulfate tablet 325 mg  325 mg Oral Daily Clydie Braun, MD  325 mg at 07/08/15 1030  . ondansetron (ZOFRAN) tablet 4 mg  4 mg Oral Q6H PRN Clydie Braunondell A Smith, MD       Or  . ondansetron (ZOFRAN) injection 4 mg  4 mg Intravenous Q6H PRN Clydie Braunondell A Smith, MD      . oxyCODONE (Oxy IR/ROXICODONE) immediate release tablet 5 mg  5 mg Oral Q6H PRN Clydie Braunondell A Smith, MD   5 mg at 07/08/15 1334  . pregabalin (LYRICA) capsule 50 mg  50 mg Oral Daily Clydie Braunondell A Smith, MD   50 mg at 07/08/15 1030  . sodium chloride 0.9 % injection 3 mL  3 mL Intravenous Q12H Clydie Braunondell A Smith, MD   3 mL at 07/07/15 0030  . traZODone (DESYREL) tablet 100 mg  100 mg Oral QHS Rondell A Katrinka BlazingSmith, MD   100 mg at 07/07/15 2031  . zolpidem (AMBIEN) tablet 5 mg  5 mg Oral QHS Maryann Mikhail, DO   5 mg at 07/07/15 2032     Discharge  Medications: Please see discharge summary for a list of discharge medications.  Relevant Imaging Results:  Relevant Lab Results:   Additional Information SS#: [id number]  Rojelio BrennerVaughn, Kensey Luepke S, LCSW [phone]

## 2015-07-08 NOTE — Progress Notes (Signed)
[facility]                                                                              Patient Demographics  Yesenia [name], is a 54 y.o. female, DOB - 1960-10-29, ZOX:[id number]  Admit date - 07/06/2015   Admitting Physician Clydie Braun, MD  Outpatient Primary MD for the patient is [name], [name], NP  LOS - 2   Chief Complaint  Patient presents with  . Hip Pain      HPI on 07/06/2015 by Dr. Madelyn Flavors Ms. Yesenia [name] is a 54 year old female with a past medical history significant for HTN, HLD, fibromyalgia, chronic pain, CKD, neuropathy, previous history of stroke, migraines symptoms started 2 days ago with pain on the left lower side of her back, and upon awaking this morning pain was in her left hip and upper thigh. Pain is worsened by any movement on that side. Review previous records. Patient has had similar complaints in the past with dealing with weakness, headaches, and dizziness. She notes inability to ambulate due to the significance of the pain on the left and now states that she is starting to have pain in the right leg as well. Also notes recent treatment for urinary tract infection with ciprofloxacin which she had completed everything except for 4 pills on the medications.  Upon admission into the ED, patient was evaluated with a x-ray which showed no acute abnormalities and then followed up with a CT scan which also showed no signs of septic arthritis. Patient unable to ambulate and she states that she lives alone and has no way of care for herself.  Assessment & Plan   Left hip pain with gait disturbance -Began approximately 2-3 days ago. Patient has been unable to get around. -X-ray of the left hip unremarkable, CT scan left hip: No evidence of fracture or dislocation, joint No evidence of fracture or dislocation, joint effusion, no findings of septic arthritis -PT and OT consulted and recommended SNF, however, given insurance, may be difficult to place  patient.   Urinary tract infection -Patient states she was recently treated for urinary tract infection by her primary physician. -UA upon admission shows 6-30 squamous epithelial cells, 0-5 WBC, many bacteria, negative nitrites and leukocytes -Patient currently afebrile with no leukocytosis, will not start antibiotics at this time  Acute on chronic kidney disease, stage III -Upon admission, creatinine 1.9, baseline appears to be 1.2 -Currently creatinine 1.20 -Continue to monitor BMP  Essential hypertension -Continue amlodipine, atenolol -HCTZ held due to AKI  Diabetes mellitus, type II -Metformin held -Continue insulin sliding scale CBG monitoring  Morbid obesity -Likely adding to her left hip and back pain. -Patient will need follow-up with her primary care physician upon discharge to discuss lifestyle modifications including diet and exercise program, or other options  Hypokalemia -resolved -Continue to monitor BMP and replace as needed  Normocytic anemia -baseline appears to be approximately 11 -Continue to monitor CBC  Code Status: Full  Family Communication: None at bedside.  Disposition Plan: Admitted for observation.  Pending possible placement  Time Spent in minutes   30 minutes  Procedures  None  Consults   None  DVT Prophylaxis  Lovenox  Lab Results  Component Value Date   PLT 276 07/08/2015    Medications  Scheduled Meds: . amLODipine  10 mg Oral q morning - 10a  . atenolol  50 mg Oral Daily  . enoxaparin (LOVENOX) injection  0.5 mg/kg Subcutaneous Q24H  . fentaNYL  25 mcg Transdermal Q72H  . ferrous sulfate  325 mg Oral Daily  . pregabalin  50 mg Oral Daily  . sodium chloride  3 mL Intravenous Q12H  . traZODone  100 mg Oral QHS  . zolpidem  5 mg Oral QHS   Continuous Infusions:  PRN Meds:.acetaminophen **OR** acetaminophen, albuterol, ondansetron **OR** ondansetron (ZOFRAN) IV, oxyCODONE  Antibiotics    Anti-infectives    None        Subjective:   Yesenia [name] seen and examined today.  Patient continues to complain of left hip and back pain with the inability to walk.  She also feels her right knee is swollen, but has been for more than one year.  Feels some nausea this morning.  Denies chest pain or shortness of breath.   Objective:   Filed Vitals:   07/07/15 1010 07/07/15 1342 07/07/15 2045 07/08/15 0417  BP: 115/72  123/78 100/60  Pulse:  80 71 74  Temp:  98.8 F (37.1 C) 98.4 F (36.9 C) 98 F (36.7 C)  TempSrc:   Oral Oral  Resp:  Height:      Weight:      SpO2:  98% 96% 98%    Wt Readings from Last 3 Encounters:  07/07/15 151.3 kg (333 lb 8.9 oz)  05/14/15 149.914 kg (330 lb 8 oz)  12/18/14 156.491 kg (345 lb)     Intake/Output Summary (Last 24 hours) at 07/08/15 1242 Last data filed at 07/07/15 1700  Gross per 24 hour  Intake    480 ml  Output      0 ml  Net    480 ml    Exam  General: Well developed, morbidly obese, NAD, appears stated age  HEENT: NCAT,  mucous membranes moist.   Cardiovascular: S1 S2 auscultated, RRR, no murmurs  Respiratory: Clear to auscultation bilaterally  Abdomen: Soft, obese, nontender, nondistended, + bowel sounds  Extremities: warm dry without cyanosis clubbing.  +edema in LE B/L. Well healed scare on RLE  Neuro: AAOx3, nonfocal  Psych: Normal affect and demeanor  Data Review   Micro Results No results found for this or any previous visit (from the past 240 hour(s)).  Radiology Reports Ct Hip Left Wo Contrast  07/06/2015  CLINICAL DATA:  Acute onset of left hip pain. Inability to walk. Possible septic arthritis. Initial encounter. EXAM: CT OF THE LEFT HIP WITHOUT CONTRAST TECHNIQUE: Multidetector CT imaging of the left hip was performed according to the standard protocol. Multiplanar CT image reconstructions were also generated. COMPARISON:  Left hip radiographs performed earlier today at 1:53 p.m. FINDINGS: There is no evidence of  fracture or dislocation. The left femoral head remains seated at the acetabulum. Mild chronic cortical irregularity is noted at the anterior aspect of the left femoral neck, likely reflecting mild exostosis. The visualized portions of both hip joints are symmetric and unremarkable in appearance, without significant joint space narrowing. No significant joint effusion is seen at the left hip. The visualized portions of the sacroiliac joints are unremarkable. The pubic symphysis is unremarkable in appearance. The bladder is moderately distended and grossly unremarkable in appearance. No significant soft tissue abnormalities are seen. There  is no evidence of significant soft tissue injury. IMPRESSION: No evidence of fracture or dislocation. No significant joint effusion. No definite findings seen to suggest septic arthritis at this time. Electronically Signed   By: Roanna RaiderJeffery  Chang M.D.   On: 07/06/2015 22:01   Dg Hip Unilat With Pelvis 2-3 Views Left  07/06/2015  CLINICAL DATA:  Acute onset left hip pain a few days ago. No known injury. Initial encounter. EXAM: DG HIP (WITH OR WITHOUT PELVIS) 2-3V LEFT COMPARISON:  None. FINDINGS: There is no evidence of hip fracture or dislocation. There is no evidence of arthropathy or other focal bone abnormality. IMPRESSION: Negative exam. Electronically Signed   By: Drusilla Kannerhomas  Dalessio M.D.   On: 07/06/2015 14:07    CBC  Recent Labs Lab 07/06/15 1352 07/07/15 0450 07/08/15 0925  WBC  --  9.2 8.6  HGB 12.2 9.8* 11.0*  HCT 36.0 30.7* 33.6*  PLT  --  260 276  MCV  --  89.5 88.7  MCH  --  28.6 29.0  MCHC  --  31.9 32.7  RDW  --  14.7 14.6    Chemistries   Recent Labs Lab 07/06/15 1352 07/06/15 1911 07/07/15 0450 07/08/15 0925  NA 144 142 142 143  K 3.9 3.5 3.3* 3.9  CL 106 109 108 109  CO2  --  25 27 23   GLUCOSE 113* 104* 123* 196*  BUN 37* 23* 17 17  CREATININE 1.90* 1.58* 1.29* 1.20*  CALCIUM  --  8.5* 8.2* 8.7*    ------------------------------------------------------------------------------------------------------------------ estimated creatinine clearance is 82.3 mL/min (by C-G formula based on Cr of 1.2). ------------------------------------------------------------------------------------------------------------------ No results for input(s): HGBA1C in the last 72 hours. ------------------------------------------------------------------------------------------------------------------ No results for input(s): CHOL, HDL, LDLCALC, TRIG, CHOLHDL, LDLDIRECT in the last 72 hours. ------------------------------------------------------------------------------------------------------------------ No results for input(s): TSH, T4TOTAL, T3FREE, THYROIDAB in the last 72 hours.  Invalid input(s): FREET3 ------------------------------------------------------------------------------------------------------------------  Recent Labs  07/07/15 0845  VITAMINB12 552  FOLATE 9.2  FERRITIN 112  TIBC 253  IRON 71  RETICCTPCT 2.1    Coagulation profile No results for input(s): INR, PROTIME in the last 168 hours.  No results for input(s): DDIMER in the last 72 hours.  Cardiac Enzymes No results for input(s): CKMB, TROPONINI, MYOGLOBIN in the last 168 hours.  Invalid input(s): CK ------------------------------------------------------------------------------------------------------------------ Invalid input(s): POCBNP    Mercy Malena D.O. on 07/08/2015 at 12:42 PM  Between 7am to 7pm - Pager - [phone]  After 7pm go to [email or web address] - password [email or web address]  And look for the night coverage person covering for me after hours  [facility] Office  [phone]

## 2015-07-08 NOTE — Clinical Social Work Placement (Addendum)
   CLINICAL SOCIAL WORK PLACEMENT  NOTE  Date:  07/08/2015  Patient Details  Name: Yesenia [name] MRN: [id number] Date of Birth: 1961/05/21  Clinical Social Work is seeking post-discharge placement for this patient at the Skilled  Nursing Facility level of care (*CSW will initial, date and re-position this form in  chart as items are completed):  Yes   Patient/family provided with [facility] Clinical Social Work Department's list of facilities offering this level of care within the geographic area requested by the patient (or if unable, by the patient's family).  Yes   Patient/family informed of their freedom to choose among providers that offer the needed level of care, that participate in Medicare, Medicaid or managed care program needed by the patient, have an available bed and are willing to accept the patient.  Yes   Patient/family informed of [place]'s ownership interest in [facility] and [facility], as well as of the fact that they are under no obligation to receive care at these facilities.  PASRR submitted to EDS on       PASRR number received on       Existing PASRR number confirmed on 07/08/15     FL2 transmitted to all facilities in geographic area requested by pt/family on 07/08/15     FL2 transmitted to all facilities within larger geographic area on 07/08/15     Patient informed that his/her managed care company has contracts with or will negotiate with certain facilities, including the following:         07/09/15   Patient/family informed of bed offers received.  Patient chooses bed at  [facility] (updated Windell MouldingEric Christan Ciccarelli, MSW, LCSWA, 07/09/15)    Physician recommends and patient chooses bed at      Patient to be transferred to  [facility] on  07/09/15 .(updated Windell MouldingEric Tia Gelb, MSW, LCSWA, 07/09/15)   Patient to be transferred to facility by  [facility] (updated Windell MouldingEric Laray Rivkin, MSW, LCSWA, 07/09/15)      Patient family notified on  07/09/15 of  transfer. (updated Windell MouldingEric Zayden Maffei, MSW, LCSWA, 07/09/15)   Name of family member notified:   Patient to notify her family (updated Windell Mouldingric Rivers Hamrick, MSW, LCSWA, 07/09/15)      PHYSICIAN Please sign FL2     Additional Comment:    _______________________________________________ Rod MaeVaughn, Emily S, LCSW 07/08/2015, 3:22 PM  Ervin KnackEric R. Kylie Simmonds, MSW, LCSWA [phone] 07/09/2015 1:12 PM

## 2015-07-09 DIAGNOSIS — G8929 Other chronic pain: Secondary | ICD-10-CM | POA: Diagnosis not present

## 2015-07-09 DIAGNOSIS — E118 Type 2 diabetes mellitus with unspecified complications: Secondary | ICD-10-CM | POA: Diagnosis not present

## 2015-07-09 DIAGNOSIS — M25552 Pain in left hip: Secondary | ICD-10-CM | POA: Diagnosis not present

## 2015-07-09 DIAGNOSIS — N179 Acute kidney failure, unspecified: Secondary | ICD-10-CM | POA: Diagnosis not present

## 2015-07-09 LAB — CBC
HCT: 30.6 % — ABNORMAL LOW (ref 36.0–46.0)
HEMOGLOBIN: 9.7 g/dL — AB (ref 12.0–15.0)
MCH: 28.1 pg (ref 26.0–34.0)
MCHC: 31.7 g/dL (ref 30.0–36.0)
MCV: 88.7 fL (ref 78.0–100.0)
Platelets: 290 10*3/uL (ref 150–400)
RBC: 3.45 MIL/uL — ABNORMAL LOW (ref 3.87–5.11)
RDW: 14.6 % (ref 11.5–15.5)
WBC: 7.7 10*3/uL (ref 4.0–10.5)

## 2015-07-09 LAB — BASIC METABOLIC PANEL
ANION GAP: 6 (ref 5–15)
BUN: 14 mg/dL (ref 6–20)
CALCIUM: 8.5 mg/dL — AB (ref 8.9–10.3)
CO2: 27 mmol/L (ref 22–32)
Chloride: 109 mmol/L (ref 101–111)
Creatinine, Ser: 0.99 mg/dL (ref 0.44–1.00)
GLUCOSE: 118 mg/dL — AB (ref 65–99)
Potassium: 3.6 mmol/L (ref 3.5–5.1)
SODIUM: 142 mmol/L (ref 135–145)

## 2015-07-09 LAB — GLUCOSE, CAPILLARY
GLUCOSE-CAPILLARY: 112 mg/dL — AB (ref 65–99)
GLUCOSE-CAPILLARY: 100 mg/dL — AB (ref 65–99)

## 2015-07-09 MED ORDER — FENTANYL 25 MCG/HR TD PT72
25.0000 ug | MEDICATED_PATCH | TRANSDERMAL | Status: DC
  Administered 2015-07-09: 25 ug via TRANSDERMAL
  Filled 2015-07-09: qty 1

## 2015-07-09 MED ORDER — OXYCODONE HCL 5 MG PO TABS: 5.0000 mg | ORAL_TABLET | Freq: Four times a day (QID) | ORAL | Status: DC | PRN

## 2015-07-09 NOTE — Clinical Social Work Note (Signed)
Patient to be d/c'ed today to [facility]. Patient and family agreeable to plans will transport via ems RN to call report to [phone].  Sola Margolis, MSW, LCSWA [phone]  

## 2015-07-09 NOTE — Progress Notes (Signed)
Physical Therapy Treatment Patient Details Name: Yesenia [name] MRN: [id number] DOB: [birth year] Today's Date: 07/09/2015    History of Present Illness Ms. Yesenia [name] is a 54 year old female with a past medical history significant for HTN, HLD, fibromyalgia, chronic pain, CKD, neuropathy, previous history of stroke, migraines symptoms started 2 days ago with pain on the left lower side of her back, and upon awaking this morning pain was in her left hip and upper thigh. Pain is worsened by any movement on that side, as well as pain R LE    PT Comments    Making progress with functional mobility and activity tolerance; Able to get up from hospital bed (with HOB elevated) without physical assist; SNF is still appropriate for rehab to maximize independence and safety with mobility   We discussed the need to think ahead to a longer term solution for living than her current apartment; Can SW at SNF help with finding resources for moving, or perhaps looking into more of an assisted living situation? Perhaps now is the time to gather information   Follow Up Recommendations  SNF     Equipment Recommendations  3in1 (PT);Hospital bed (ambulance transport home)    Recommendations for Other Services       Precautions / Restrictions Precautions Precautions: Fall Restrictions Weight Bearing Restrictions: No    Mobility  Bed Mobility Overal bed mobility: Needs Assistance Bed Mobility: Supine to Sit     Supine to sit: Min guard;HOB elevated     General bed mobility comments: HOB elevated and use of hand rails. Good technique. No physical assist given  Transfers Overall transfer level: Needs assistance Equipment used: Rolling walker (2 wheeled) Transfers: Sit to/from Stand Sit to Stand: Min guard;+2 safety/equipment         General transfer comment: No physical assist required for sit to stand but close min guard +2 given for safety. VC for hand placement and technique. [facility]rolled [facility]  to [facility] and chair  Ambulation/Gait Ambulation/Gait assistance: Min assist;+2 safety/equipment Ambulation Distance (Feet): 20 Feet (to/friom bathroom) Assistive device: Rolling walker (2 wheeled) Gait Pattern/deviations: Step-through pattern;Decreased step length - right;Decreased step length - left;Decreased stride length     General Gait Details: Heavy lean on RW; very short steps; limited by pain; one instance of R knee buckle with the need fo rmin assist for safety   Stairs            Wheelchair Mobility    Modified Rankin (Stroke Patients Only)       Balance Overall balance assessment: Needs assistance         Standing balance support: During functional activity;No upper extremity supported Standing balance-Leahy Scale: Fair Standing balance comment: Able to stand at sink and wash hands without UEs supported                     Cognition Arousal/Alertness: Awake/alert Behavior During Therapy: WFL for tasks assessed/performed Overall Cognitive Status: Within Functional Limits for tasks assessed                      Exercises      General Comments General comments (skin integrity, edema, etc.):       Pertinent Vitals/Pain Pain Assessment: 0-10 Pain Score: 9  Pain Location: R outer thigh Pain Descriptors / Indicators: Aching;Grimacing Pain Intervention(s): Limited activity within patient's tolerance;Premedicated before session;Repositioned    Home Living  Prior Function            PT Goals (current goals can now be found in the care plan section) Acute Rehab PT Goals Patient Stated Goal: reduce pain and go to rehab then home PT Goal Formulation: With patient Time For Goal Achievement: 07/21/15 Potential to Achieve Goals: Good Progress towards PT goals: Progressing toward goals    Frequency  Min 3X/week    PT Plan Current plan remains appropriate;Other (comment) ()    Co-evaluation PT/OT/SLP  Co-Evaluation/Treatment: Yes Reason for Co-Treatment: For patient/therapist safety PT goals addressed during session: Mobility/safety with mobility OT goals addressed during session: ADL's and self-care     End of Session Equipment Utilized During Treatment: Gait belt Activity Tolerance: Patient limited by pain Patient left: in chair;with call bell/phone within reach     Time: 0940-1010 PT Time Calculation (min) (ACUTE ONLY): 30 min  Charges:  $Gait Training: 8-22 mins                    G Codes:      Olen Pel 07/09/2015, 11:37 AM  Van Clines, PT  Acute Rehabilitation Services Pager [phone] Office [phone]

## 2015-07-09 NOTE — Progress Notes (Signed)
Pt stated that her fentanyl patch came off today while washing herself up. We looked al over her room but can not find it. She stated that it really doesn't matter since she will be going home tomorrow. I told her we will monitor her pain closely tonight.

## 2015-07-09 NOTE — Discharge Instructions (Signed)

## 2015-07-09 NOTE — Progress Notes (Signed)
Occupational Therapy Treatment Patient Details Name: Yesenia [name] MRN: [id number] DOB: 1960/09/04 Today's Date: 07/09/2015    History of present illness Ms. Virgel Manifold is a 54 year old female with a past medical history significant for HTN, HLD, fibromyalgia, chronic pain, CKD, neuropathy, previous history of stroke, migraines symptoms started 2 days ago with pain on the left lower side of her back, and upon awaking this morning pain was in her left hip and upper thigh. Pain is worsened by any movement on that side, as well as pain R LE   OT comments  Pt making steady progress toward OT goals. Pt able to ambulate to bathroom for toilet transfer, toileting, and grooming in standing with min guard assist +2 for safety. Pt noted to have 1 LOB backward with functional mobility; pt reports that she could feel her knee buckle. Continue to recommend SNF for further rehab prior to returning home. Will continue to follow acutely.    Follow Up Recommendations  SNF;Supervision/Assistance - 24 hour    Equipment Recommendations  3 in 1 bedside comode;Tub/shower bench;Other (comment) (Bariatric 3in1. AE: lh shoe horn/lh sponge/reacher/sock aide)    Recommendations for Other Services      Precautions / Restrictions Precautions Precautions: Fall Restrictions Weight Bearing Restrictions: No       Mobility Bed Mobility Overal bed mobility: Needs Assistance Bed Mobility: Supine to Sit     Supine to sit: Min guard;HOB elevated     General bed mobility comments: HOB elevated and use of hand rails. Good technique. No physical assist given  Transfers Overall transfer level: Needs assistance Equipment used: Rolling walker (2 wheeled) Transfers: Sit to/from Stand Sit to Stand: Min guard;+2 safety/equipment         General transfer comment: No physical assist required for sit to stand but close min guard +2 given for safety. VC for hand placement and technique. Controlled descent to [facility] and  chair    Balance Overall balance assessment: Needs assistance         Standing balance support: During functional activity;No upper extremity supported Standing balance-Leahy Scale: Fair Standing balance comment: Able to stand at sink and wash hands without UEs supported                    ADL Overall ADL's : Needs assistance/impaired     Grooming: Min guard;Standing                   Toilet Transfer: Min guard;+2 for safety/equipment;Ambulation;BSC;RW (BSC over toilet)   Toileting- Clothing Manipulation and Hygiene: Min guard;Sitting/lateral lean       Functional mobility during ADLs: Min guard;+2 for safety/equipment;Rolling walker General ADL Comments: No family present for OT session. Pt noted to have 1 LOB backward upon ambulation to bathroom for toileting. Pt reports that she felt like her L knee gave out on her a little.       Vision                     Perception     Praxis      Cognition   Behavior During Therapy: [facility] for tasks assessed/performed Overall Cognitive Status: Within Functional Limits for tasks assessed                       Extremity/Trunk Assessment               Exercises     Shoulder Instructions  General Comments      Pertinent Vitals/ Pain       Pain Assessment: 0-10 Pain Score: 9  Pain Location: R outter thigh Pain Descriptors / Indicators: Aching;Grimacing Pain Intervention(s): Monitored during session;Premedicated before session;Repositioned  Home Living                                          Prior Functioning/Environment              Frequency Min 2X/week     Progress Toward Goals  OT Goals(current goals can now be found in the care plan section)  Progress towards OT goals: Progressing toward goals  Acute Rehab OT Goals Patient Stated Goal: reduce pain and go to rehab then home OT Goal Formulation: With patient  Plan Discharge plan remains  appropriate    Co-evaluation    PT/OT/SLP Co-Evaluation/Treatment: Yes Reason for Co-Treatment: For patient/therapist safety   OT goals addressed during session: ADL's and self-care      End of Session Equipment Utilized During Treatment: Gait belt;Rolling walker   Activity Tolerance Patient tolerated treatment well   Patient Left in chair;with call bell/phone within reach   Nurse Communication          Time: 0940-1003 OT Time Calculation (min): 23 min  Charges: OT General Charges $OT Visit: 1 Procedure OT Treatments $Self Care/Home Management : 8-22 mins  Gaye AlkenBailey A Zackari Ruane M.S., OTR/L Pager: [phone]  07/09/2015, 10:41 AM

## 2015-07-09 NOTE — Discharge Summary (Addendum)
Physician Discharge Summary  Christionna Poland ZOX:[id number] DOB: 06/27/1961 DOA: 07/06/2015  PCP: Egbert Garibaldi, NP  Admit date: 07/06/2015 Discharge date: 07/09/2015  Time spent: 45 minutes  Recommendations for Outpatient Follow-up:  Patient will be discharged to skilled nursing facility.  The therapy is recommended by the facility. Patient will need to follow up with primary care provider within one week of discharge, repeat CBC and BMP in 1 week.  Patient should continue medications as prescribed.  Patient should follow a heart healthy/carb modified diet.   Discharge Diagnoses:  Left hip pain with gait disturbance Urinary tract infection Acute on chronic kidney disease, stage III Diabetes mellitus, type II Essential hypertension Hypokalemia Morbid obesity Normocytic anemia/anemia of chronic disease  Discharge Condition: Stable  Diet recommendation: Heart healthy/carb modified  Filed Weights   07/07/15 0037  Weight: 151.3 kg (333 lb 8.9 oz)    History of present illness:  on 07/06/2015 by Dr. Madelyn Flavors Ms. Yesenia [name] is a 54 year old female with a past medical history significant for HTN, HLD, fibromyalgia, chronic pain, CKD, neuropathy, previous history of stroke, migraines symptoms started 2 days ago with pain on the left lower side of her back, and upon awaking this morning pain was in her left hip and upper thigh. Pain is worsened by any movement on that side. Review previous records. Patient has had similar complaints in the past with dealing with weakness, headaches, and dizziness. She notes inability to ambulate due to the significance of the pain on the left and now states that she is starting to have pain in the right leg as well. Also notes recent treatment for urinary tract infection with ciprofloxacin which she had completed everything except for 4 pills on the medications.  Upon admission into the ED, patient was evaluated with a x-ray which showed no acute  abnormalities and then followed up with a CT scan which also showed no signs of septic arthritis. Patient unable to ambulate and she states that she lives alone and has no way of care for herself.  Hospital Course:  Left hip pain with gait disturbance -Began approximately 2-3 days ago. Patient has been unable to get around. -X-ray of the left hip unremarkable, CT scan left hip: No evidence of fracture or dislocation, joint No evidence of fracture or dislocation, joint effusion, no findings of septic arthritis -PT and OT consulted and recommended SNF  Urinary tract infection -Patient states she was recently treated for urinary tract infection by her primary physician. -UA upon admission shows 6-30 squamous epithelial cells, 0-5 WBC, many bacteria, negative nitrites and leukocytes -Patient currently afebrile with no leukocytosis, will not start antibiotics at this time  Acute on chronic kidney disease, stage III -Upon admission, creatinine 1.9, baseline appears to be 1.2 -Currently creatinine 0.99 -Repeat BMP in one week  Essential hypertension -Continue amlodipine, atenolol -HCTZ held due to AKI  Diabetes mellitus, type II -Metformin held -Continue insulin sliding scale CBG monitoring  Morbid obesity -Likely adding to her left hip and back pain. -Patient will need follow-up with her primary care physician upon discharge to discuss lifestyle modifications including diet and exercise program, or other options  Hypokalemia -resolved -Repeat BMP in one week  Normocytic anemia/ACD -baseline appears to be approximately 11, currently 9.7 -Repeat CBC in one week  Procedures  None  Consults  None  Discharge Exam: Filed Vitals:   07/08/15 2055 07/09/15 0521  BP: 107/86 125/73  Pulse: 77 77  Temp: 97.8 F (36.6 C) 98.2 F (36.8  C)  Resp: 18 18   Exam  General: Well developed, morbidly obese, NAD  HEENT: NCAT, mucous membranes moist.   Cardiovascular: S1 S2  auscultated, RRR, no murmurs  Respiratory: Clear to auscultation bilaterally  Abdomen: Soft, obese, nontender, nondistended, + bowel sounds  Extremities: warm dry without cyanosis clubbing. Well healed scare on RLE  Neuro: AAOx3, nonfocal  Psych: Normal affect and demeanor  Discharge Instructions      Discharge Instructions    Discharge instructions    Complete by:  As directed   Patient will be discharged to skilled nursing facility.  The therapy is recommended by the facility. Patient will need to follow up with primary care provider within one week of discharge, repeat CBC and BMP in 1 week.  Patient should continue medications as prescribed.  Patient should follow a heart healthy diet.            Medication List    STOP taking these medications        ciprofloxacin 500 MG tablet  Commonly known as:  CIPRO      TAKE these medications        albuterol 108 (90 Base) MCG/ACT inhaler  Commonly known as:  PROVENTIL HFA;VENTOLIN HFA  Inhale 2 puffs into the lungs every 6 (six) hours as needed for wheezing.     amLODipine 10 MG tablet  Commonly known as:  NORVASC  Take 10 mg by mouth every morning.     atenolol 50 MG tablet  Commonly known as:  TENORMIN  Take 50 mg by mouth daily.     BELSOMRA 15 MG Tabs  Generic drug:  Suvorexant  Take 15 mg by mouth at bedtime.     Dexlansoprazole 30 MG capsule  Take 30 mg by mouth daily.     ferrous sulfate 325 (65 FE) MG tablet  Take 325 mg by mouth daily.     hydrochlorothiazide 25 MG tablet  Commonly known as:  HYDRODIURIL  Take 25 mg by mouth daily.     metFORMIN 500 MG tablet  Commonly known as:  GLUCOPHAGE  Take 500 mg by mouth daily.     oxyCODONE 5 MG immediate release tablet  Commonly known as:  Oxy IR/ROXICODONE  Take 1 tablet (5 mg total) by mouth every 6 (six) hours as needed for moderate pain.     pregabalin 50 MG capsule  Commonly known as:  LYRICA  Take 50 mg by mouth daily.     traZODone 100 MG  tablet  Commonly known as:  DESYREL  Take 100 mg by mouth at bedtime.       Allergies  Allergen Reactions  . Morphine And Related Hives   Follow-up Information    Follow up with [name], [name], NP. Schedule an appointment as soon as possible for a visit in 1 week.   Why:  Hospital follow-up, repeat CBC and BMP   Contact information:   [facility] [address] [place] [address] [phone]        The results of significant diagnostics from this hospitalization (including imaging, microbiology, ancillary and laboratory) are listed below for reference.    Significant Diagnostic Studies: Ct Hip Left Wo Contrast  07/06/2015  CLINICAL DATA:  Acute onset of left hip pain. Inability to walk. Possible septic arthritis. Initial encounter. EXAM: CT OF THE LEFT HIP WITHOUT CONTRAST TECHNIQUE: Multidetector CT imaging of the left hip was performed according to the standard protocol. Multiplanar CT image reconstructions  were also generated. COMPARISON:  Left hip radiographs performed earlier today at 1:53 p.m. FINDINGS: There is no evidence of fracture or dislocation. The left femoral head remains seated at the acetabulum. Mild chronic cortical irregularity is noted at the anterior aspect of the left femoral neck, likely reflecting mild exostosis. The visualized portions of both hip joints are symmetric and unremarkable in appearance, without significant joint space narrowing. No significant joint effusion is seen at the left hip. The visualized portions of the sacroiliac joints are unremarkable. The pubic symphysis is unremarkable in appearance. The bladder is moderately distended and grossly unremarkable in appearance. No significant soft tissue abnormalities are seen. There is no evidence of significant soft tissue injury. IMPRESSION: No evidence of fracture or dislocation. No significant joint effusion. No definite findings seen to suggest septic arthritis at this  time. Electronically Signed   By: Roanna RaiderJeffery  Chang M.D.   On: 07/06/2015 22:01   Dg Hip Unilat With Pelvis 2-3 Views Left  07/06/2015  CLINICAL DATA:  Acute onset left hip pain a few days ago. No known injury. Initial encounter. EXAM: DG HIP (WITH OR WITHOUT PELVIS) 2-3V LEFT COMPARISON:  None. FINDINGS: There is no evidence of hip fracture or dislocation. There is no evidence of arthropathy or other focal bone abnormality. IMPRESSION: Negative exam. Electronically Signed   By: Drusilla Kannerhomas  Dalessio M.D.   On: 07/06/2015 14:07    Microbiology: No results found for this or any previous visit (from the past 240 hour(s)).   Labs: Basic Metabolic Panel:  Recent Labs Lab 07/06/15 1352 07/06/15 1911 07/07/15 0450 07/08/15 0925 07/09/15 0700  NA 144 142 142 143 142  K 3.9 3.5 3.3* 3.9 3.6  CL 106 109 108 109 109  CO2  --  25 27 23 27   GLUCOSE 113* 104* 123* 196* 118*  BUN 37* 23* 17 17 14   CREATININE 1.90* 1.58* 1.29* 1.20* 0.99  CALCIUM  --  8.5* 8.2* 8.7* 8.5*   Liver Function Tests: No results for input(s): AST, ALT, ALKPHOS, BILITOT, PROT, ALBUMIN in the last 168 hours. No results for input(s): LIPASE, AMYLASE in the last 168 hours. No results for input(s): AMMONIA in the last 168 hours. CBC:  Recent Labs Lab 07/06/15 1352 07/07/15 0450 07/08/15 0925 07/09/15 0700  WBC  --  9.2 8.6 7.7  HGB 12.2 9.8* 11.0* 9.7*  HCT 36.0 30.7* 33.6* 30.6*  MCV  --  89.5 88.7 88.7  PLT  --  260 276 290   Cardiac Enzymes: No results for input(s): CKTOTAL, CKMB, CKMBINDEX, TROPONINI in the last 168 hours. BNP: BNP (last 3 results) No results for input(s): BNP in the last 8760 hours.  ProBNP (last 3 results) No results for input(s): PROBNP in the last 8760 hours.  CBG:  Recent Labs Lab 07/08/15 1136 07/08/15 1613 07/08/15 2214 07/09/15 0632 07/09/15 1120  GLUCAP 105* 91 98 112* 100*       Signed:  Jianni Batten  [facility] 07/09/2015, 11:41 AM

## 2015-07-10 ENCOUNTER — Non-Acute Institutional Stay (SKILLED_NURSING_FACILITY): Payer: Medicaid Other | Admitting: Internal Medicine

## 2015-07-10 ENCOUNTER — Encounter: Payer: Self-pay | Admitting: Internal Medicine

## 2015-07-10 DIAGNOSIS — I10 Essential (primary) hypertension: Secondary | ICD-10-CM

## 2015-07-10 DIAGNOSIS — M25552 Pain in left hip: Secondary | ICD-10-CM

## 2015-07-10 DIAGNOSIS — N189 Chronic kidney disease, unspecified: Secondary | ICD-10-CM

## 2015-07-10 DIAGNOSIS — E118 Type 2 diabetes mellitus with unspecified complications: Secondary | ICD-10-CM

## 2015-07-10 DIAGNOSIS — D509 Iron deficiency anemia, unspecified: Secondary | ICD-10-CM | POA: Diagnosis not present

## 2015-07-10 DIAGNOSIS — G629 Polyneuropathy, unspecified: Secondary | ICD-10-CM

## 2015-07-10 DIAGNOSIS — K219 Gastro-esophageal reflux disease without esophagitis: Secondary | ICD-10-CM | POA: Diagnosis not present

## 2015-07-10 DIAGNOSIS — N179 Acute kidney failure, unspecified: Secondary | ICD-10-CM

## 2015-07-10 NOTE — Assessment & Plan Note (Signed)
Began approximately 2-3 days ago. Patient has been unable to get around. -X-ray of the left hip unremarkable, CT scan left hip: No evidence of fracture or dislocation, joint No evidence of fracture or dislocation, joint effusion, no findings of septic arthritis SNF -PT and OT

## 2015-07-10 NOTE — Progress Notes (Signed)
MRN: [id number] Name: Yesenia [name]  Sex: female Age: 54 y.o. DOB: April 18, 1961  PSC #: Ronni Rumble Facility/Room:127A Level Of Care: SNF Provider: Merrilee Seashore D Emergency Contacts: Extended Emergency Contact Information Primary Emergency Contact: Howell,Catrina  [place] Home Phone: [phone] Relation: Daughter  Code Status:   Allergies: Morphine and related  Chief Complaint  Patient presents with  . New Admit To SNF    HPI: Patient is 54 y.o. female with HTN, HLD, fibromyalgia, chronic pain, CKD, neuropathy, previous history of stroke, migraines symptoms started 2 days ago with pain on the left lower side of her back, and upon awaking this morning pain was in her left hip and upper thigh. Pt was admitted to [facility] 12/25-[facility] because she couldn't walk.Workup of back and hip was neg, but hospitalization was complicated by ARF. Pt is admitted to SNF for OT/PT. While at SNF pt will be followed for HTN, tx with with norvasc, HCTZ, and atenolol, DM2, tx with glucophage and anemia, tx with iron.  Past Medical History  Diagnosis Date  . Hypertension   . Stroke (HCC)   . Diabetes mellitus without complication (HCC)   . Anemia   . Fibromyalgia   . Chronic pain   . Right sided weakness   . Vitamin deficiency     Vit D  . Hyperlipemia   . Anemia   . Insomnia   . Neuropathy (HCC)   . CKD (chronic kidney disease)     Past Surgical History  Procedure Laterality Date  . Leg surgery Right 2007    Surgery x3  . Oophorectomy    . Knee surgery  1992, 1995, 1997      Medication List       This list is accurate as of: 07/10/15 11:59 PM.  Always use your most recent med list.               albuterol 108 (90 Base) MCG/ACT inhaler  Commonly known as:  PROVENTIL HFA;VENTOLIN HFA  Inhale 2 puffs into the lungs every 6 (six) hours as needed for wheezing.     amLODipine 10 MG tablet  Commonly known as:  NORVASC  Take 10 mg by mouth every morning.     atenolol 50  MG tablet  Commonly known as:  TENORMIN  Take 50 mg by mouth daily.     BELSOMRA 15 MG Tabs  Generic drug:  Suvorexant  Take 15 mg by mouth at bedtime.     Dexlansoprazole 30 MG capsule  Take 30 mg by mouth daily.     ferrous sulfate 325 (65 FE) MG tablet  Take 325 mg by mouth daily.     hydrochlorothiazide 25 MG tablet  Commonly known as:  HYDRODIURIL  Take 25 mg by mouth daily.     metFORMIN 500 MG tablet  Commonly known as:  GLUCOPHAGE  Take 500 mg by mouth daily.     oxyCODONE 5 MG immediate release tablet  Commonly known as:  Oxy IR/ROXICODONE  Take 1 tablet (5 mg total) by mouth every 6 (six) hours as needed for moderate pain.     pregabalin 50 MG capsule  Commonly known as:  LYRICA  Take 50 mg by mouth daily.     traZODone 100 MG tablet  Commonly known as:  DESYREL  Take 100 mg by mouth at bedtime.        No orders of the defined types were placed in this encounter.    Immunization History  Administered  Date(s) Administered  . Influenza,inj,Quad PF,36+ Mos 04/12/2013  . Pneumococcal Polysaccharide-23 04/12/2013    Social History  Substance Use Topics  . Smoking status: Never Smoker   . Smokeless tobacco: Never Used  . Alcohol Use: No    Family history is + breast CA    Review of Systems  DATA OBTAINED: from patient, nurse GENERAL:  no fevers, fatigue, appetite changes SKIN: No itching, rash or wounds EYES: No eye pain, redness, discharge EARS: No earache, tinnitus, change in hearing NOSE: No congestion, drainage or bleeding  MOUTH/THROAT: No mouth or tooth pain, No sore throat RESPIRATORY: No cough, wheezing, SOB CARDIAC: No chest pain, palpitations, lower extremity edema  GI: No abdominal pain, No N/V/D or constipation, No heartburn or reflux  GU: No dysuria, frequency or urgency, or incontinence  MUSCULOSKELETAL: No unrelieved bone/joint pain NEUROLOGIC: No headache, dizziness or focal weakness PSYCHIATRIC: No c/o anxiety or sadness    Filed Vitals:   07/14/15 1702  BP: 130/84  Pulse: 88  Temp: 97.6 F (36.4 C)  Resp: 20    SpO2 Readings from Last 1 Encounters:  07/09/15 100%        Physical Exam  GENERAL APPEARANCE: Alert, conversant, obese No acute distress.  SKIN: No diaphoresis rash HEAD: Normocephalic, atraumatic  EYES: Conjunctiva/lids clear. Pupils round, reactive. EOMs intact.  EARS: External exam WNL, canals clear. Hearing grossly normal.  NOSE: No deformity or discharge.  MOUTH/THROAT: Lips w/o lesions  RESPIRATORY: Breathing is even, unlabored. Lung sounds are clear   CARDIOVASCULAR: Heart RRR no murmurs, rubs or gallops. No peripheral edema.   GASTROINTESTINAL: Abdomen is soft, non-tender, not distended w/ normal bowel sounds. GENITOURINARY: Bladder non tender, not distended  MUSCULOSKELETAL: No abnormal joints or musculature NEUROLOGIC:  Cranial nerves 2-12 grossly intact. Moves all extremities  PSYCHIATRIC: Mood and affect appropriate to situation, no behavioral issues  Patient Active Problem List   Diagnosis Date Noted  . Polyneuropathy (HCC) 07/10/2015  . GERD (gastroesophageal reflux disease) 07/10/2015  . Acute kidney injury superimposed on chronic kidney disease (HCC) 07/07/2015  . Gait disturbance 07/07/2015  . Morbid obesity (HCC) 07/07/2015  . Left hip pain 07/06/2015  . Chronic pain   . Right sided weakness   . Headache(784.0) 04/14/2013  . Encephalopathy acute 04/11/2013  . Acute pyelonephritis 04/11/2013  . Diabetes (HCC) 04/11/2013  . Dyslipidemia 04/11/2013  . CVA (cerebral infarction) 01/14/2013  . HTN (hypertension) 01/14/2013  . Anemia 01/14/2013    CBC    Component Value Date/Time   WBC 7.7 07/09/2015 0700   RBC 3.45* 07/09/2015 0700   RBC 3.45* 07/07/2015 0845   HGB 9.7* 07/09/2015 0700   HCT 30.6* 07/09/2015 0700   PLT 290 07/09/2015 0700   MCV 88.7 07/09/2015 0700   LYMPHSABS 2.4 09/24/2014 1255   MONOABS 0.6 09/24/2014 1255   EOSABS 0.2  09/24/2014 1255   BASOSABS 0.1 09/24/2014 1255    CMP     Component Value Date/Time   NA 142 07/09/2015 0700   K 3.6 07/09/2015 0700   CL 109 07/09/2015 0700   CO2 27 07/09/2015 0700   GLUCOSE 118* 07/09/2015 0700   BUN 14 07/09/2015 0700   CREATININE 0.99 07/09/2015 0700   CALCIUM 8.5* 07/09/2015 0700   PROT 7.4 09/24/2014 1255   ALBUMIN 3.7 09/24/2014 1255   AST 19 09/24/2014 1255   ALT 23 09/24/2014 1255   ALKPHOS 66 09/24/2014 1255   BILITOT 0.7 09/24/2014 1255   GFRNONAA >60 07/09/2015 0700   GFRAA >  60 07/09/2015 0700    Lab Results  Component Value Date   HGBA1C 7.0* 04/11/2013     Ct Hip Left Wo Contrast  07/06/2015  CLINICAL DATA:  Acute onset of left hip pain. Inability to walk. Possible septic arthritis. Initial encounter. EXAM: CT OF THE LEFT HIP WITHOUT CONTRAST TECHNIQUE: Multidetector CT imaging of the left hip was performed according to the standard protocol. Multiplanar CT image reconstructions were also generated. COMPARISON:  Left hip radiographs performed earlier today at 1:53 p.m. FINDINGS: There is no evidence of fracture or dislocation. The left femoral head remains seated at the acetabulum. Mild chronic cortical irregularity is noted at the anterior aspect of the left femoral neck, likely reflecting mild exostosis. The visualized portions of both hip joints are symmetric and unremarkable in appearance, without significant joint space narrowing. No significant joint effusion is seen at the left hip. The visualized portions of the sacroiliac joints are unremarkable. The pubic symphysis is unremarkable in appearance. The bladder is moderately distended and grossly unremarkable in appearance. No significant soft tissue abnormalities are seen. There is no evidence of significant soft tissue injury. IMPRESSION: No evidence of fracture or dislocation. No significant joint effusion. No definite findings seen to suggest septic arthritis at this time. Electronically  Signed   By: Roanna Raider M.D.   On: 07/06/2015 22:01   Dg Hip Unilat With Pelvis 2-3 Views Left  07/06/2015  CLINICAL DATA:  Acute onset left hip pain a few days ago. No known injury. Initial encounter. EXAM: DG HIP (WITH OR WITHOUT PELVIS) 2-3V LEFT COMPARISON:  None. FINDINGS: There is no evidence of hip fracture or dislocation. There is no evidence of arthropathy or other focal bone abnormality. IMPRESSION: Negative exam. Electronically Signed   By: Drusilla Kanner M.D.   On: 07/06/2015 14:07    Not all labs, radiology exams or other studies done during hospitalization come through on my EPIC note; however they are reviewed by me.    Assessment and Plan  Left hip pain Began approximately 2-3 days ago. Patient has been unable to get around. -X-ray of the left hip unremarkable, CT scan left hip: No evidence of fracture or dislocation, joint No evidence of fracture or dislocation, joint effusion, no findings of septic arthritis SNF -PT and OT   Acute kidney injury superimposed on chronic kidney disease (HCC) Upon admission, creatinine 1.9, baseline appears to be 1.2 -Currently creatinine 0.99 SNF -Repeat BMP in one week  HTN (hypertension) SNF - Continue amlodipine, atenolol, restart HCTZ    Diabetes (HCC) SNF - not stated as uncontrolled ;cont glucophage 500 mg daily  Polyneuropathy (HCC) SNF - cont lyrica 50 mg daily  GERD (gastroesophageal reflux disease) SNF - not stated as uncontrolled ; cont dexilant 30 mg daily  Anemia SNF - d/c HB 9.7; cont iron daily; check CBC i week   Time spent > 45 min;> 50% of time with patient was spent reviewing records, labs, tests and studies, counseling and developing plan of care  Margit Hanks, MD

## 2015-07-10 NOTE — Assessment & Plan Note (Signed)
SNF - not stated as uncontrolled ;cont glucophage 500 mg daily

## 2015-07-10 NOTE — Assessment & Plan Note (Signed)
SNF - not stated as uncontrolled ;cont dexilant 30 mg daily 

## 2015-07-10 NOTE — Assessment & Plan Note (Signed)
Upon admission, creatinine 1.9, baseline appears to be 1.2 -Currently creatinine 0.99 SNF -Repeat BMP in one week

## 2015-07-10 NOTE — Assessment & Plan Note (Signed)
SNF - cont lyrica 50 mg daily

## 2015-07-10 NOTE — Assessment & Plan Note (Signed)
SNF - Continue amlodipine, atenolol, restart HCTZ

## 2015-07-10 NOTE — Assessment & Plan Note (Signed)
SNF - d/c HB 9.7; cont iron daily; check CBC i week

## 2015-07-14 ENCOUNTER — Encounter: Payer: Self-pay | Admitting: Internal Medicine

## 2015-07-16 ENCOUNTER — Encounter (HOSPITAL_COMMUNITY): Payer: Self-pay

## 2015-07-16 DIAGNOSIS — N189 Chronic kidney disease, unspecified: Secondary | ICD-10-CM | POA: Insufficient documentation

## 2015-07-16 DIAGNOSIS — M797 Fibromyalgia: Secondary | ICD-10-CM | POA: Diagnosis not present

## 2015-07-16 DIAGNOSIS — E785 Hyperlipidemia, unspecified: Secondary | ICD-10-CM | POA: Diagnosis not present

## 2015-07-16 DIAGNOSIS — E119 Type 2 diabetes mellitus without complications: Secondary | ICD-10-CM | POA: Insufficient documentation

## 2015-07-16 DIAGNOSIS — G8929 Other chronic pain: Secondary | ICD-10-CM | POA: Diagnosis not present

## 2015-07-16 DIAGNOSIS — R2232 Localized swelling, mass and lump, left upper limb: Secondary | ICD-10-CM | POA: Diagnosis present

## 2015-07-16 DIAGNOSIS — M25522 Pain in left elbow: Secondary | ICD-10-CM | POA: Diagnosis not present

## 2015-07-16 DIAGNOSIS — I129 Hypertensive chronic kidney disease with stage 1 through stage 4 chronic kidney disease, or unspecified chronic kidney disease: Secondary | ICD-10-CM | POA: Diagnosis not present

## 2015-07-16 DIAGNOSIS — D649 Anemia, unspecified: Secondary | ICD-10-CM | POA: Insufficient documentation

## 2015-07-16 DIAGNOSIS — Z8673 Personal history of transient ischemic attack (TIA), and cerebral infarction without residual deficits: Secondary | ICD-10-CM | POA: Insufficient documentation

## 2015-07-16 DIAGNOSIS — Z79899 Other long term (current) drug therapy: Secondary | ICD-10-CM | POA: Insufficient documentation

## 2015-07-16 DIAGNOSIS — I889 Nonspecific lymphadenitis, unspecified: Secondary | ICD-10-CM | POA: Diagnosis not present

## 2015-07-16 NOTE — ED Notes (Signed)
Pt arrived from [facility] via [facility]. States she gets around via w/c and walker. Pt here for pain to her left arm for the past 3 days and swelling to her left neck that started today. To touch it is very sensitive.

## 2015-07-17 ENCOUNTER — Emergency Department (HOSPITAL_COMMUNITY)
Admission: EM | Admit: 2015-07-17 | Discharge: 2015-07-17 | Disposition: A | Discharge status: Home / Self Care | Payer: Medicaid Other | Attending: Emergency Medicine | Admitting: Emergency Medicine

## 2015-07-17 ENCOUNTER — Emergency Department (HOSPITAL_COMMUNITY): Payer: Medicaid Other

## 2015-07-17 DIAGNOSIS — I889 Nonspecific lymphadenitis, unspecified: Secondary | ICD-10-CM

## 2015-07-17 DIAGNOSIS — M25522 Pain in left elbow: Secondary | ICD-10-CM

## 2015-07-17 LAB — BASIC METABOLIC PANEL
Anion gap: 9 (ref 5–15)
BUN: 16 mg/dL (ref 6–20)
CHLORIDE: 104 mmol/L (ref 101–111)
CO2: 29 mmol/L (ref 22–32)
CREATININE: 1.13 mg/dL — AB (ref 0.44–1.00)
Calcium: 9 mg/dL (ref 8.9–10.3)
GFR calc Af Amer: >60 mL/min (ref >60)
GFR calc non Af Amer: 54 mL/min — ABNORMAL LOW (ref >60)
GLUCOSE: 112 mg/dL — AB (ref 65–99)
POTASSIUM: 3.7 mmol/L (ref 3.5–5.1)
Sodium: 142 mmol/L (ref 135–145)

## 2015-07-17 LAB — CBC WITH DIFFERENTIAL/PLATELET
Basophils Absolute: 0 10*3/uL (ref 0.0–0.1)
Basophils Relative: 0 %
Eosinophils Absolute: 0.2 10*3/uL (ref 0.0–0.7)
Eosinophils Relative: 2 %
HEMATOCRIT: 33.9 % — AB (ref 36.0–46.0)
HEMOGLOBIN: 11.3 g/dL — AB (ref 12.0–15.0)
LYMPHS ABS: 2.5 10*3/uL (ref 0.7–4.0)
Lymphocytes Relative: 25 %
MCH: 29.4 pg (ref 26.0–34.0)
MCHC: 33.3 g/dL (ref 30.0–36.0)
MCV: 88.1 fL (ref 78.0–100.0)
MONOS PCT: 9 %
Monocytes Absolute: 0.9 10*3/uL (ref 0.1–1.0)
NEUTROS ABS: 6.5 10*3/uL (ref 1.7–7.7)
NEUTROS PCT: 64 %
PLATELETS: 318 10*3/uL (ref 150–400)
RBC: 3.85 MIL/uL — ABNORMAL LOW (ref 3.87–5.11)
RDW: 14.3 % (ref 11.5–15.5)
WBC: 10 10*3/uL (ref 4.0–10.5)

## 2015-07-17 LAB — SEDIMENTATION RATE: Sed Rate: 60 mm/hr — ABNORMAL HIGH (ref 0–22)

## 2015-07-17 LAB — C-REACTIVE PROTEIN: CRP: 3.1 mg/dL — ABNORMAL HIGH (ref <1.0)

## 2015-07-17 MED ORDER — HYDROMORPHONE HCL 1 MG/ML IJ SOLN
1.0000 mg | Freq: Once | INTRAMUSCULAR | Status: AC
  Administered 2015-07-17: 1 mg via INTRAVENOUS
  Filled 2015-07-17: qty 1

## 2015-07-17 MED ORDER — OXYCODONE HCL 5 MG PO TABS: 5.0000 mg | ORAL_TABLET | Freq: Four times a day (QID) | ORAL | Status: DC | PRN

## 2015-07-17 NOTE — Discharge Instructions (Signed)
We are not sure what is causing your elbow pain. We want you to ice the area 4 times a day for 10 min. Do activities as tolerated - including during PT at your nursing facility. If not getting better - see the Orthopedic doctor in 2 weeks. If you start noticing increased redness, swelling, pain - come back to the ER.   Cryotherapy Cryotherapy means treatment with cold. Ice or gel packs can be used to reduce both pain and swelling. Ice is the most helpful within the first 24 to 48 hours after an injury or flare-up from overusing a muscle or joint. Sprains, strains, spasms, burning pain, shooting pain, and aches can all be eased with ice. Ice can also be used when recovering from surgery. Ice is effective, has very few side effects, and is safe for most people to use. PRECAUTIONS  Ice is not a safe treatment option for people with:  Raynaud phenomenon. This is a condition affecting small blood vessels in the extremities. Exposure to cold may cause your problems to return.  Cold hypersensitivity. There are many forms of cold hypersensitivity, including:  Cold urticaria. Red, itchy hives appear on the skin when the tissues begin to warm after being iced.  Cold erythema. This is a red, itchy rash caused by exposure to cold.  Cold hemoglobinuria. Red blood cells break down when the tissues begin to warm after being iced. The hemoglobin that carry oxygen are passed into the urine because they cannot combine with blood proteins fast enough.  Numbness or altered sensitivity in the area being iced. If you have any of the following conditions, do not use ice until you have discussed cryotherapy with your caregiver:  Heart conditions, such as arrhythmia, angina, or chronic heart disease.  High blood pressure.  Healing wounds or open skin in the area being iced.  Current infections.  Rheumatoid arthritis.  Poor circulation.  Diabetes. Ice slows the blood flow in the region it is applied. This  is beneficial when trying to stop inflamed tissues from spreading irritating chemicals to surrounding tissues. However, if you expose your skin to cold temperatures for too long or without the proper protection, you can damage your skin or nerves. Watch for signs of skin damage due to cold. HOME CARE INSTRUCTIONS Follow these tips to use ice and cold packs safely.  Place a dry or damp towel between the ice and skin. A damp towel will cool the skin more quickly, so you may need to shorten the time that the ice is used.  For a more rapid response, add gentle compression to the ice.  Ice for no more than 10 to 20 minutes at a time. The bonier the area you are icing, the less time it will take to get the benefits of ice.  Check your skin after 5 minutes to make sure there are no signs of a poor response to cold or skin damage.  Rest 20 minutes or more between uses.  Once your skin is numb, you can end your treatment. You can test numbness by very lightly touching your skin. The touch should be so light that you do not see the skin dimple from the pressure of your fingertip. When using ice, most people will feel these normal sensations in this order: cold, burning, aching, and numbness.  Do not use ice on someone who cannot communicate their responses to pain, such as small children or people with dementia. HOW TO MAKE AN ICE PACK Ice packs  are the most common way to use ice therapy. Other methods include ice massage, ice baths, and cryosprays. Muscle creams that cause a cold, tingly feeling do not offer the same benefits that ice offers and should not be used as a substitute unless recommended by your caregiver. To make an ice pack, do one of the following:  Place crushed ice or a bag of frozen vegetables in a sealable plastic bag. Squeeze out the excess air. Place this bag inside another plastic bag. Slide the bag into a pillowcase or place a damp towel between your skin and the bag.  Mix 3 parts  water with 1 part rubbing alcohol. Freeze the mixture in a sealable plastic bag. When you remove the mixture from the freezer, it will be slushy. Squeeze out the excess air. Place this bag inside another plastic bag. Slide the bag into a pillowcase or place a damp towel between your skin and the bag. SEEK MEDICAL CARE IF:  You develop white spots on your skin. This may give the skin a blotchy (mottled) appearance.  Your skin turns blue or pale.  Your skin becomes waxy or hard.  Your swelling gets worse. MAKE SURE YOU:   Understand these instructions.  Will watch your condition.  Will get help right away if you are not doing well or get worse.   This information is not intended to replace advice given to you by your health care provider. Make sure you discuss any questions you have with your health care provider.   Document Released: 02/22/2011 Document Revised: 07/19/2014 Document Reviewed: 02/22/2011 Elsevier Interactive Patient Education 2016 [employer].  Joint Pain Joint pain, which is also called arthralgia, can be caused by many things. Joint pain often goes away when you follow your health care provider's instructions for relieving pain at home. However, joint pain can also be caused by conditions that require further treatment. Common causes of joint pain include:  Bruising in the area of the joint.  Overuse of the joint.  Wear and tear on the joints that occur with aging (osteoarthritis).  Various other forms of arthritis.  A buildup of a crystal form of uric acid in the joint (gout).  Infections of the joint (septic arthritis) or of the bone (osteomyelitis). Your health care provider may recommend medicine to help with the pain. If your joint pain continues, additional tests may be needed to diagnose your condition. HOME CARE INSTRUCTIONS Watch your condition for any changes. Follow these instructions as directed to lessen the pain that you are feeling.  Take  medicines only as directed by your health care provider.  Rest the affected area for as long as your health care provider says that you should. If directed to do so, raise the painful joint above the level of your heart while you are sitting or lying down.  Do not do things that cause or worsen pain.  If directed, apply ice to the painful area:  Put ice in a plastic bag.  Place a towel between your skin and the bag.  Leave the ice on for 20 minutes, 2-3 times per day.  Wear an elastic bandage, splint, or sling as directed by your health care provider. Loosen the elastic bandage or splint if your fingers or toes become numb and tingle, or if they turn cold and blue.  Begin exercising or stretching the affected area as directed by your health care provider. Ask your health care provider what types of exercise are safe for  you.  Keep all follow-up visits as directed by your health care provider. This is important. SEEK MEDICAL CARE IF:  Your pain increases, and medicine does not help.  Your joint pain does not improve within 3 days.  You have increased bruising or swelling.  You have a fever.  You lose 10 lb (4.5 kg) or more without trying. SEEK IMMEDIATE MEDICAL CARE IF:  You are not able to move the joint.  Your fingers or toes become numb or they turn cold and blue.   This information is not intended to replace advice given to you by your health care provider. Make sure you discuss any questions you have with your health care provider.   Document Released: 06/28/2005 Document Revised: 07/19/2014 Document Reviewed: 04/09/2014 Elsevier Interactive Patient Education [employer]2016 [employer] Inc.

## 2015-07-17 NOTE — ED Notes (Signed)
[facility]

## 2015-07-17 NOTE — ED Provider Notes (Signed)
I was asked to reevaluate this patient that was previously seen by Dr. Rhunette [facility]. She has been out for discharge for 2 hours awaiting the [facility] transport service to return her to her ECF. Sedimentation rate and C-reactive protein were ordered and pending. They have since returned moderately elevated. The patient was reexamined. Her elbow has some discomfort with range of motion, however is not warm or red. I doubt a septic arthritis at this time. I will recommend proceeding with the plan as per Dr. Rhunette [facility]avati. To return as needed if symptoms worsen or change.  Geoffery Lyonsouglas Rusty Glodowski, MD 07/17/15 [phone]

## 2015-07-17 NOTE — ED Notes (Signed)
Patient transported by [facility]. 

## 2015-07-17 NOTE — ED Notes (Signed)
Pt arrived for pt

## 2015-07-17 NOTE — ED Notes (Signed)
RN attempt IV x 2; 2nd RN to attempt to get IV and blood work

## 2015-07-17 NOTE — ED Notes (Signed)
Lab at bedside attempting to collect blood.   

## 2015-07-17 NOTE — ED Notes (Signed)
Spoke with MD Delo about patient's elevated blood levels. MD examined patient and verified patient was able to be discharged. Pt reeducated about reasons to come back to the ED and verbalized understanding.

## 2015-07-17 NOTE — ED Provider Notes (Signed)
CSN: [id number]     Arrival date & time 07/16/15  2020 History  By signing my name below, I, Yesenia [name], attest that this documentation has been prepared under the direction and in the presence of Derwood KaplanAnkit Jocelin Schuelke, MD . Electronically Signed: Freida Busmaniana [name], Scribe. 07/17/2015. 3:39 AM.    Chief Complaint  Patient presents with  . Arm Pain  . Arm Swelling     The history is provided by the patient. No language interpreter was used.     HPI Comments:  Yesenia [name] is a 55 y.o. female with a past medical history significant for HTN, HLD, fibromyalgia, chronic pain, CKD, neuropathy, migraines symptoms , who presents to the Emergency Department complaining of moderate throbbing pain to her left arm x 3 days and a spot of swelling to the left neck which she noticed today. She denies recent trauma/injury to the site. She also denies h/o CA, and psoriasis. She is currently living at a rehab facility due to difficulty ambulating. No alleviating factors noted.   Past Medical History  Diagnosis Date  . Hypertension   . Stroke (HCC)   . Diabetes mellitus without complication (HCC)   . Anemia   . Fibromyalgia   . Chronic pain   . Right sided weakness   . Vitamin deficiency     Vit D  . Hyperlipemia   . Anemia   . Insomnia   . Neuropathy (HCC)   . CKD (chronic kidney disease)    Past Surgical History  Procedure Laterality Date  . Leg surgery Right 2007    Surgery x3  . Oophorectomy    . Knee surgery  1992, 1995, 1997   Family History  Problem Relation Age of Onset  . Leukemia Brother   . Breast cancer Mother   . Breast cancer Maternal Grandmother    Social History  Substance Use Topics  . Smoking status: Never Smoker   . Smokeless tobacco: Never Used  . Alcohol Use: No   OB History    No data available     Review of Systems  10 systems reviewed and all are negative for acute change except as noted in the HPI.   Allergies  Morphine and related  Home Medications    Prior to Admission medications   Medication Sig Start Date End Date Taking? Authorizing Provider  albuterol (PROVENTIL HFA;VENTOLIN HFA) 108 (90 BASE) MCG/ACT inhaler Inhale 2 puffs into the lungs every 6 (six) hours as needed for wheezing.    Historical Provider, MD  amLODipine (NORVASC) 10 MG tablet Take 10 mg by mouth every morning.     Historical Provider, MD  atenolol (TENORMIN) 50 MG tablet Take 50 mg by mouth daily. 11/29/14   Historical Provider, MD  BELSOMRA 15 MG TABS Take 15 mg by mouth at bedtime. 09/13/14   Historical Provider, MD  Dexlansoprazole 30 MG capsule Take 30 mg by mouth daily.    Historical Provider, MD  ferrous sulfate 325 (65 FE) MG tablet Take 325 mg by mouth daily. 09/13/14   Historical Provider, MD  hydrochlorothiazide (HYDRODIURIL) 25 MG tablet Take 25 mg by mouth daily. 09/13/14   Historical Provider, MD  metFORMIN (GLUCOPHAGE) 500 MG tablet Take 500 mg by mouth daily. 09/13/14   Historical Provider, MD  oxyCODONE (OXY IR/ROXICODONE) 5 MG immediate release tablet Take 1 tablet (5 mg total) by mouth every 6 (six) hours as needed for moderate pain. 07/17/15   Derwood KaplanAnkit Jayvion Stefanski, MD  pregabalin (LYRICA) 50 MG capsule Take  50 mg by mouth daily.    Historical Provider, MD  traZODone (DESYREL) 100 MG tablet Take 100 mg by mouth at bedtime. 09/07/14   Historical Provider, MD   BP 106/69 mmHg  Pulse 68  Temp(Src) 98.2 F (36.8 C) (Oral)  Resp 16  Ht 5\' 6"  (1.676 m)  Wt 333 lb (151.048 kg)  BMI 53.77 kg/m2  SpO2 96% Physical Exam  Constitutional: She is oriented to person, place, and time. She appears well-developed and well-nourished. No distress.  HENT:  Head: Normocephalic and atraumatic.  Eyes: Conjunctivae are normal.  Cardiovascular: Normal rate and intact distal pulses.   Pulses:      Radial pulses are 2+ on the left side.  Pulmonary/Chest: Effort normal.  Abdominal: She exhibits no distension.  Musculoskeletal:  5 cm in diameter nodule to left lateral neck, with  TTP. No fluctuance, erythema, or callor No midline C-spine tenderness  No gross deformity Hard to assess elbow due to body habitus but pt with tenderness to medial elbow Pt able to pronate and supinate. Tenderness  with elbow flexion; flex is compromised  Neurological: She is alert and oriented to person, place, and time.  Skin: Skin is warm and dry.  Psychiatric: She has a normal mood and affect.  Nursing note and vitals reviewed.   ED Course  Procedures   DIAGNOSTIC STUDIES:  Oxygen Saturation is 99% on RA, normal by my interpretation.    COORDINATION OF CARE:  3:28 AM Discussed treatment plan with pt at bedside and pt agreed to plan.  Labs Review Labs Reviewed  CBC WITH DIFFERENTIAL/PLATELET - Abnormal; Notable for the following:    RBC 3.85 (*)    Hemoglobin 11.3 (*)    HCT 33.9 (*)    All other components within normal limits  BASIC METABOLIC PANEL - Abnormal; Notable for the following:    Glucose, Bld 112 (*)    Creatinine, Ser 1.13 (*)    GFR calc non Af Amer 54 (*)    All other components within normal limits  C-REACTIVE PROTEIN - Abnormal; Notable for the following:    CRP 3.1 (*)    All other components within normal limits  SEDIMENTATION RATE    Imaging Review Dg Elbow Complete Left  07/17/2015  CLINICAL DATA:  Acute onset of left elbow pain and swelling. Initial encounter. EXAM: LEFT ELBOW - COMPLETE 3+ VIEW COMPARISON:  None. FINDINGS: There is no evidence of fracture or dislocation. Several small osseous fragments are noted overlying the medial epicondyle, likely reflecting remote injury. The visualized joint spaces are preserved. No significant joint effusion is identified. The soft tissues are unremarkable in appearance. IMPRESSION: 1. No evidence of acute fracture or dislocation. 2. Several small osseous fragments overlying the medial epicondyle, likely reflecting remote injury. Electronically Signed   By: Roanna Raider M.D.   On: 07/17/2015 05:10   I have  personally reviewed and evaluated these images and lab results as part of my medical decision-making.   EKG Interpretation None      MDM   Final diagnoses:  Lymphadenitis  Left elbow pain   I personally performed the services described in this documentation, which was scribed in my presence. The recorded information has been reviewed and is accurate.   PT comes in with cc of neck mass and L elbow pain. Separate complains, onset separate time. The neck mass - mobile, not firm over the SCM region L side. Appears lymphadnitis Not too tender.Not warm or red to touch. Will  tx conservatively - no antibiotics for now. Advised seeing her doctor in 1-2 weeks if the symptoms dont resolve, as she might need further workup. Elbow pain - tenderness is reproducible with palpation of the medial part of the elbow. No joint warmth, or redness. She is diabetic. Her ROM is compromised. Pretest probability for septic arthritis is low, but will get CRP and sedrate - if both are too high, then we will consider septic arthritis workup. PT will get ortho f/u if not getting better. Will tx with RICE.  Derwood Kaplan, MD 07/17/15 [phone]

## 2015-07-17 NOTE — ED Notes (Signed)
PTAR called by French Anaracy, Secretary

## 2015-07-22 ENCOUNTER — Non-Acute Institutional Stay (SKILLED_NURSING_FACILITY): Payer: Medicaid Other | Admitting: Adult Health

## 2015-07-22 DIAGNOSIS — I10 Essential (primary) hypertension: Secondary | ICD-10-CM | POA: Diagnosis not present

## 2015-07-22 DIAGNOSIS — K219 Gastro-esophageal reflux disease without esophagitis: Secondary | ICD-10-CM | POA: Diagnosis not present

## 2015-07-22 DIAGNOSIS — D509 Iron deficiency anemia, unspecified: Secondary | ICD-10-CM

## 2015-07-22 DIAGNOSIS — G8929 Other chronic pain: Secondary | ICD-10-CM | POA: Diagnosis not present

## 2015-07-22 DIAGNOSIS — G629 Polyneuropathy, unspecified: Secondary | ICD-10-CM

## 2015-07-22 DIAGNOSIS — G4700 Insomnia, unspecified: Secondary | ICD-10-CM | POA: Diagnosis not present

## 2015-07-22 DIAGNOSIS — E1142 Type 2 diabetes mellitus with diabetic polyneuropathy: Secondary | ICD-10-CM | POA: Diagnosis not present

## 2015-08-10 ENCOUNTER — Encounter: Payer: Self-pay | Admitting: Adult Health

## 2015-08-10 DIAGNOSIS — G4700 Insomnia, unspecified: Secondary | ICD-10-CM | POA: Insufficient documentation

## 2015-08-10 DIAGNOSIS — G4720 Circadian rhythm sleep disorder, unspecified type: Secondary | ICD-10-CM | POA: Insufficient documentation

## 2015-08-10 NOTE — Progress Notes (Signed)
Patient ID: Yesenia [name], female   DOB: Sep 26, 1960, 55 y.o.   MRN: [id number]    Facility:  [facility]      Allergies  Allergen Reactions  . Morphine And Related Hives    Chief Complaint  Patient presents with  . Hospitalization Follow-up    HPI:  She has been seen in the ED for left elbow pain and left neck mass. In the ED the left neck nodule was 5 cm it is shrinking at this time. Her left elbow pain is improving. She does complain of chronic diarrhea without celiac disease. We did discuss treatments for her frequent stools. I will review her medications; more than likely the metformin is playing a role. She could benefit from a probiotic.    Past Medical History  Diagnosis Date  . Hypertension   . Stroke (HCC)   . Diabetes mellitus without complication (HCC)   . Anemia   . Fibromyalgia   . Chronic pain   . Right sided weakness   . Vitamin deficiency     Vit D  . Hyperlipemia   . Anemia   . Insomnia   . Neuropathy (HCC)   . CKD (chronic kidney disease)     Past Surgical History  Procedure Laterality Date  . Leg surgery Right 2007    Surgery x3  . Oophorectomy    . Knee surgery  1992, 1995, 1997    VITAL SIGNS BP 130/84 mmHg  Pulse 88  Ht  (1.676 m)  Wt 333 lb (151.048 kg)  BMI 53.77 kg/m2  SpO2 100%  Patient's Medications  New Prescriptions   No medications on file  Previous Medications   ALBUTEROL (PROVENTIL HFA;VENTOLIN HFA) 108 (90 BASE) MCG/ACT INHALER    Inhale 2 puffs into the lungs every 6 (six) hours as needed for wheezing.   AMLODIPINE (NORVASC) 10 MG TABLET    Take 10 mg by mouth every morning.    ATENOLOL (TENORMIN) 50 MG TABLET    Take 50 mg by mouth daily.   BELSOMRA 15 MG TABS    Take 15 mg by mouth at bedtime.   DEXLANSOPRAZOLE 30 MG CAPSULE    Take 30 mg by mouth daily.   FERROUS SULFATE 325 (65 FE) MG TABLET    Take 325 mg by mouth daily.   HYDROCHLOROTHIAZIDE (HYDRODIURIL) 25 MG TABLET    Take 25 mg by mouth daily.   METFORMIN  (GLUCOPHAGE) 500 MG TABLET    Take 500 mg by mouth daily.   ONDANSETRON (ZOFRAN) 4 MG TABLET    Take 4 mg by mouth every 6 (six) hours as needed for nausea or vomiting.   OXYCODONE (OXY IR/ROXICODONE) 5 MG IMMEDIATE RELEASE TABLET    Take 1 tablet (5 mg total) by mouth every 6 (six) hours as needed for moderate pain.   PREGABALIN (LYRICA) 50 MG CAPSULE    Take 50 mg by mouth daily.   TRAZODONE (DESYREL) 100 MG TABLET    Take 100 mg by mouth at bedtime.  Modified Medications   No medications on file  Discontinued Medications   No medications on file     SIGNIFICANT DIAGNOSTIC EXAMS  07-06-15: bilateral hip and pelvis x-ray; Negative exam.  07-06-15: ct scan of  left hip: No evidence of fracture or dislocation. No significant joint effusion. No definite findings seen to suggest septic arthritis at this time  07-17-15: left elbow x-ray: 1. No evidence of acute fracture or dislocation. 2. Several small osseous fragments overlying  the medial epicondyle, likely reflecting remote injury.   LABS REVIEWED:   07-07-15: wbc 9.2; hgb 9.8; hct 30.7; mcv 89.5; plt 260 glucose 123; bun 17; creat 1.29; k+ 3.3; na++142; vitamin B12: 552; folate 9.2; iron 71; TIBC 253 07-09-15: wbc 7.7; hgb 9.7; hct 30.6; mcv 88.7; ;plt 290; glucose 118; bun 14; creat 0.99; k+ 3.6; na++142 07-17-15: wbc 10.0; hgb 11.3; hct 33.9; mcv 88.1; plt 318; glucose 112; bun 16; creat 1.13; k+ 3.7; na++142; sed rate 60; CRP 3.1     Review of Systems  Constitutional: Negative for malaise/fatigue.  Respiratory: Negative for cough and shortness of breath.   Cardiovascular: Negative for chest pain, palpitations and leg swelling.  Gastrointestinal: Positive for diarrhea. Negative for heartburn, nausea, abdominal pain, constipation and blood in stool.  Musculoskeletal: Negative for myalgias and joint pain.       Pain is being managed   Skin: Negative.   Neurological: Negative for headaches.  Psychiatric/Behavioral: The patient is  not nervous/anxious.     Physical Exam  Constitutional: She is oriented to person, place, and time. She appears well-developed and well-nourished. No distress.  Morbidly obese   Eyes: Conjunctivae are normal.  Neck: Neck supple. No JVD present. No thyromegaly present.  Cardiovascular: Normal rate, regular rhythm and intact distal pulses.   Respiratory: Effort normal and breath sounds normal. No respiratory distress. She has no wheezes.  GI: Soft. Bowel sounds are normal. She exhibits no distension. There is no tenderness.  Musculoskeletal: She exhibits no edema.  Able to move all extremities   Lymphadenopathy:    She has no cervical adenopathy.  Neurological: She is alert and oriented to person, place, and time.  Skin: Skin is warm and dry. She is not diaphoretic.  Psychiatric: She has a normal mood and affect.      ASSESSMENT/ PLAN:  1. Diabetes: will stop her metformin due to her frequent stools; will monitor her cbg twice daily   2. Hypertension: will continue norvasc 10 mg daily; atenolol 50 mg daily; hctz 25 mg daily will monitor   3. Chronic pain: her pain is presently being managed with the following regimen: oxycodone 5 mg every 6 hours as needed;  lyrica 50 mg daily for her peripheral neuropathy will monitor  4. Gerd: will continue dexlansoprazole 30 mg daily   5. Anemia: will continue iron daily   6. Chronic insomnia: will continue belsomra 15 mg nightly and will continue trazodone 100 mg nightly   Time spent with patient  50  minutes >50% time spent counseling; reviewing medical record; tests; labs; and developing future plan of care   Synthia Innocent NP [facility]  Contact [phone] Monday through Friday 8am- 5pm  After hours call [phone]

## 2015-08-14 ENCOUNTER — Ambulatory Visit (INDEPENDENT_AMBULATORY_CARE_PROVIDER_SITE_OTHER): Payer: Medicaid Other | Admitting: Adult Health

## 2015-08-14 ENCOUNTER — Encounter: Payer: Self-pay | Admitting: Adult Health

## 2015-08-14 ENCOUNTER — Telehealth: Payer: Self-pay | Admitting: Neurology

## 2015-08-14 VITALS — BP 112/68 | HR 56 | Ht 66.0 in | Wt 331.5 lb

## 2015-08-14 DIAGNOSIS — Z9989 Dependence on other enabling machines and devices: Secondary | ICD-10-CM

## 2015-08-14 DIAGNOSIS — G4733 Obstructive sleep apnea (adult) (pediatric): Secondary | ICD-10-CM

## 2015-08-14 DIAGNOSIS — E669 Obesity, unspecified: Secondary | ICD-10-CM | POA: Diagnosis not present

## 2015-08-14 NOTE — Telephone Encounter (Signed)
Diana: Pls call patient to offer ENT referral for OSA. Place referral in chart.

## 2015-08-14 NOTE — Patient Instructions (Signed)
Speak to DME company about the machine. If your symptoms worsen or you develop new symptoms please let us know.

## 2015-08-14 NOTE — Telephone Encounter (Signed)
I spoke to patient and she would like ENT referral. Referral put in today.

## 2015-08-14 NOTE — Addendum Note (Signed)
Addended by: Huston Foley on: 08/14/2015 03:15 PM   Modules accepted: Orders

## 2015-08-14 NOTE — Progress Notes (Addendum)
PATIENT: Yesenia [name] DOB: Nov 26, 1960  REASON FOR VISIT: follow up- OSA on CPAP HISTORY FROM: patient  HISTORY OF PRESENT ILLNESS: Ms. Yesenia [name] is a 55 year old female with a history of obstructive sleep apnea on CPAP. She returns today for a compliance download. At the last visit the patient reported that she had a hard time using the full face mask and her download showed poor compliance. She saw Robin in the sleep lab and was switched to the nasal pillows. The patient states that this dried out her nose and she's not been using it. She states she did turn up the humidification but that was not helpful. The patient states that she's tried the full face mask, nasal mask and nasal pillows. At this point she states that she does not think she can tolerate the CPAP and does not plan to use it. Patient also states that she does not think she would use an oral device either. The patient states that on Christmas she woke up and was unable to move her legs. She states this is due to her "fibromyalgia"? She is currently at [facility] for rehabilitation. She returns today for an evaluation.  HISTORY  05/14/15: Yesenia [name] is a 55 year old female with a history of obstructive sleep apnea on CPAP. She was just recently started on CPAP and returns today for a download. Her download indicates that she uses her machine 14 out of 30 days for compliance of 47%. On average she uses the machine 2hours and 24 minutes. She uses the machine greater than 4 hours only 2 out of 30 days for compliance of 70%. Her AHI was 3.2 on 10 cm of water with EPR of 2. She did have a leak in the 95th percentile at 32.4 L/m. The patient states that she is wearing a full face mask but she is unable to tolerate this. She states that she cannot adjust to having the mask cover her face. She has tried the nasal mask but was unable to tolerate that as well. She states that she does not endorse a regular sleep routine. She does not have a set  bedtime nor awake time. She states that she continues to have trouble falling asleep. She denies any new medical issues. She returns today for an evaluation.  HISTORY 12/18/14 Yesenia [name]): I saw your patient, Yesenia [name], upon your kind request in my neurologic clinic today for initial consultation of her sleep disorder, in particular, concern for underlying obstructive sleep apnea. The patient is unaccompanied today. As you know, Yesenia [name] is a 55 year old right-handed woman with an underlying medical history of fibromyalgia, hypertension, COPD, chronic back pain, diabetes, vitamin D deficiency, anemia, recurrent headaches (for which she had seen Dr. Everlena Cooper at [facility]) and severe obesity, who reports difficulty with her sleep, snoring and excessive daytime somnolence. I reviewed your office note from 11/01/2014. Of note, she presented to the emergency room on 09/24/2014 with the faculty with her speech and balance problems ongoing for 3 days. She was noted to have stuttering. A brain MRI was negative for any acute changes. Apparently, she has had prior emergency room presentations in the recent couple of years with similar presentations and reassuring brain MRI findings. She's currently on Lyrica 50 mg 3 times a day, trazodone 100 mg at night, and Belsomra 15 mg daily, as far as potentially sedating medications are concerned. She is no longer on Ambien. She reports significant nocturia up to 45 times each night. She occasionally wakes  up with a headache. She has snoring and abnormal sounds in her sleep and has woken herself up with a sense of gasping for air. She has a maternal aunt with obstructive sleep apnea who uses a CPAP machine. The patient also endorses restless leg symptoms and twitching in her sleep and she has been told by her boyfriend that she kicks in her sleep. She is a nonsmoker. She does not drink alcohol. She drinks caffeine occasionally, and if she drinks caffeine she has to drink it  before 9 AM, otherwise she will not sleep. She has gained weight. She has had difficulty initiating and maintaining sleep for over 15 years. She has tried multiple medications.    REVIEW OF SYSTEMS: Out of a complete 14 system review of symptoms, the patient complains only of the following symptoms, and all other reviewed systems are negative.  See history of present illness  ALLERGIES: Allergies  Allergen Reactions  . Morphine And Related Hives    HOME MEDICATIONS: Outpatient Prescriptions Prior to Visit  Medication Sig Dispense Refill  . albuterol (PROVENTIL HFA;VENTOLIN HFA) 108 (90 BASE) MCG/ACT inhaler Inhale 2 puffs into the lungs every 6 (six) hours as needed for wheezing.    Marland Kitchen amLODipine (NORVASC) 10 MG tablet Take 10 mg by mouth every morning.     Marland Kitchen atenolol (TENORMIN) 50 MG tablet Take 50 mg by mouth daily.  3  . BELSOMRA 15 MG TABS Take 15 mg by mouth at bedtime.  3  . Dexlansoprazole 30 MG capsule Take 30 mg by mouth daily.    . ferrous sulfate 325 (65 FE) MG tablet Take 325 mg by mouth daily.  6  . hydrochlorothiazide (HYDRODIURIL) 25 MG tablet Take 25 mg by mouth daily.  3  . metFORMIN (GLUCOPHAGE) 500 MG tablet Take 500 mg by mouth daily.  3  . ondansetron (ZOFRAN) 4 MG tablet Take 4 mg by mouth every 6 (six) hours as needed for nausea or vomiting.    Marland Kitchen oxyCODONE (OXY IR/ROXICODONE) 5 MG immediate release tablet Take 1 tablet (5 mg total) by mouth every 6 (six) hours as needed for moderate pain. 12 tablet 0  . pregabalin (LYRICA) 50 MG capsule Take 50 mg by mouth daily.    . traZODone (DESYREL) 100 MG tablet Take 100 mg by mouth at bedtime.  2   No facility-administered medications prior to visit.    PAST MEDICAL HISTORY: Past Medical History  Diagnosis Date  . Hypertension   . Stroke (HCC)   . Diabetes mellitus without complication (HCC)   . Anemia   . Fibromyalgia   . Chronic pain   . Right sided weakness   . Vitamin deficiency     Vit D  . Hyperlipemia     . Anemia   . Insomnia   . Neuropathy (HCC)   . CKD (chronic kidney disease)     PAST SURGICAL HISTORY: Past Surgical History  Procedure Laterality Date  . Leg surgery Right 2007    Surgery x3  . Oophorectomy    . Knee surgery  1992, 1995, 1997    FAMILY HISTORY: Family History  Problem Relation Age of Onset  . Leukemia Brother   . Breast cancer Mother   . Breast cancer Maternal Grandmother     SOCIAL HISTORY: Social History   Social History  . Marital Status: Single    Spouse Name: N/A  . Number of Children: 2  . Years of Education: 12th    Occupational History  .  [employer]    Social History Main Topics  . Smoking status: Never Smoker   . Smokeless tobacco: Never Used  . Alcohol Use: No  . Drug Use: No  . Sexual Activity: No   Other Topics Concern  . Not on file   Social History Narrative   Reports no caffeine use       PHYSICAL EXAM  Filed Vitals:   08/14/15 1410  BP: 112/68  Pulse: 56  Height:  (1.676 m)  Weight: 331 lb 8 oz (150.367 kg)   Body mass index is 53.53 kg/(m^2).  Generalized: Well developed, in no acute distress, obese  Neurological examination  Mentation: Alert oriented to time, place, history taking. Follows all commands speech and language fluent Cranial nerve II-XII: Pupils were equal round reactive to light. Extraocular movements were full, visual field were full on confrontational test. Facial sensation and strength were normal. Uvula tongue midline. Head turning and shoulder shrug  were normal and symmetric. Motor: The motor testing reveals 5 over 5 strength of all 4 extremities. Good symmetric motor tone is noted throughout.  Sensory: Sensory testing is intact to soft touch on all 4 extremities. No evidence of extinction is noted.  Coordination: Cerebellar testing reveals good finger-nose-finger and heel-to-shin bilaterally.  Gait and station: Patient uses a walker when in relating. Tandem gait not attempted. Patient was  unable to get on the exam table.   DIAGNOSTIC DATA (LABS, IMAGING, TESTING) - I reviewed patient records, labs, notes, testing and imaging myself where available.  Lab Results  Component Value Date   WBC 10.0 07/17/2015   HGB 11.3* 07/17/2015   HCT 33.9* 07/17/2015   MCV 88.1 07/17/2015   PLT 318 07/17/2015      Component Value Date/Time   NA 142 07/17/2015 0559   K 3.7 07/17/2015 0559   CL 104 07/17/2015 0559   CO2 29 07/17/2015 0559   GLUCOSE 112* 07/17/2015 0559   BUN 16 07/17/2015 0559   CREATININE 1.13* 07/17/2015 0559   CALCIUM 9.0 07/17/2015 0559   PROT 7.4 09/24/2014 1255   ALBUMIN 3.7 09/24/2014 1255   AST 19 09/24/2014 1255   ALT 23 09/24/2014 1255   ALKPHOS 66 09/24/2014 1255   BILITOT 0.7 09/24/2014 1255   GFRNONAA 54* 07/17/2015 0559   GFRAA >60 07/17/2015 0559   Lab Results  Component Value Date   CHOL 224* 01/14/2013   HDL 56 01/14/2013   LDLCALC 141* 01/14/2013   TRIG 133 01/14/2013   CHOLHDL 4.0 01/14/2013    Lab Results  Component Value Date   VITAMINB12 552 07/07/2015       ASSESSMENT AND PLAN 55 y.o. year old female  has a past medical history of Hypertension; Stroke (HCC); Diabetes mellitus without complication (HCC); Anemia; Fibromyalgia; Chronic pain; Right sided weakness; Vitamin deficiency; Hyperlipemia; Anemia; Insomnia; Neuropathy (HCC); and CKD (chronic kidney disease). here with:  1. Obstructive sleep apnea on CPAP 2. Obesity  The patient states that she is not going to use the CPAP. She states that she cannot tolerate the mask due to claustrophobia. She is also not interested in an oral device. Patient understands the risk associated with untreated sleep apnea. Patient advised that weight loss would also be beneficial for her sleep apnea. She verbalized understanding. She will follow up with our office on an as-needed basis.  Butch Penny, MSN, NP-C 08/14/2015, 2:15 PM [facility] [address], [address] [place], [place] [address] [phone]   I reviewed the  above note and documentation by the Nurse Practitioner and agree with the history, physical exam, assessment and plan as outlined above. In addition, we will offer an ENT referral to patient for potential surgical intervention for her OSA. I was immediately available for face-to-face consultation. Huston Foley, MD, PhD [facility])

## 2015-08-20 ENCOUNTER — Non-Acute Institutional Stay (SKILLED_NURSING_FACILITY): Payer: Medicaid Other | Admitting: Adult Health

## 2015-08-20 DIAGNOSIS — I10 Essential (primary) hypertension: Secondary | ICD-10-CM

## 2015-08-20 DIAGNOSIS — M25552 Pain in left hip: Secondary | ICD-10-CM | POA: Diagnosis not present

## 2015-08-20 DIAGNOSIS — R531 Weakness: Secondary | ICD-10-CM

## 2015-08-20 DIAGNOSIS — G629 Polyneuropathy, unspecified: Secondary | ICD-10-CM

## 2015-08-20 DIAGNOSIS — M6289 Other specified disorders of muscle: Secondary | ICD-10-CM

## 2015-08-27 DIAGNOSIS — I129 Hypertensive chronic kidney disease with stage 1 through stage 4 chronic kidney disease, or unspecified chronic kidney disease: Secondary | ICD-10-CM | POA: Diagnosis not present

## 2015-08-27 DIAGNOSIS — E1122 Type 2 diabetes mellitus with diabetic chronic kidney disease: Secondary | ICD-10-CM | POA: Diagnosis not present

## 2015-08-27 DIAGNOSIS — M25552 Pain in left hip: Secondary | ICD-10-CM | POA: Diagnosis not present

## 2015-08-27 DIAGNOSIS — E1142 Type 2 diabetes mellitus with diabetic polyneuropathy: Secondary | ICD-10-CM | POA: Diagnosis not present

## 2015-09-15 ENCOUNTER — Other Ambulatory Visit: Payer: Self-pay | Admitting: Adult Health

## 2015-10-03 DIAGNOSIS — G4733 Obstructive sleep apnea (adult) (pediatric): Secondary | ICD-10-CM

## 2015-10-03 HISTORY — DX: Obstructive sleep apnea (adult) (pediatric): G47.33

## 2015-10-05 NOTE — Progress Notes (Signed)
Patient ID: Yesenia [name], female   DOB: 11-29-1960, 55 y.o.   MRN: [id number]    Facility:  [facility]       Allergies  Allergen Reactions  . Morphine And Related Hives    Chief Complaint  Patient presents with  . Discharge Note    HPI:  She is being discharged to home with home health for pt/ot/rn. She will need a standard wheelchair; 3:1 commode and 2 quad canes. She will need her prescriptions to be written and will need to follow up with her pcp.     Past Medical History  Diagnosis Date  . Hypertension   . Stroke (HCC)   . Diabetes mellitus without complication (HCC)   . Anemia   . Fibromyalgia   . Chronic pain   . Right sided weakness   . Vitamin deficiency     Vit D  . Hyperlipemia   . Anemia   . Insomnia   . Neuropathy (HCC)   . CKD (chronic kidney disease)     Past Surgical History  Procedure Laterality Date  . Leg surgery Right 2007    Surgery x3  . Oophorectomy    . Knee surgery  1992, 1995, 1997    VITAL SIGNS BP 130/80 mmHg  Pulse 90  Ht 5\' 6"  (1.676 m)  Wt 333 lb (151.048 kg)  BMI 53.77 kg/m2  Patient's Medications  New Prescriptions   No medications on file  Previous Medications   ALBUTEROL (PROVENTIL HFA;VENTOLIN HFA) 108 (90 BASE) MCG/ACT INHALER    Inhale 2 puffs into the lungs every 6 (six) hours as needed for wheezing.   AMLODIPINE (NORVASC) 10 MG TABLET    Take 10 mg by mouth every morning.    ATENOLOL (TENORMIN) 50 MG TABLET    Take 50 mg by mouth daily.   BELSOMRA 15 MG TABS    Take 15 mg by mouth at bedtime.   DEXLANSOPRAZOLE 30 MG CAPSULE    Take 30 mg by mouth daily.   FERROUS SULFATE 325 (65 FE) MG TABLET    Take 325 mg by mouth daily.   HYDROCHLOROTHIAZIDE (HYDRODIURIL) 25 MG TABLET    Take 25 mg by mouth daily.   ONDANSETRON (ZOFRAN) 4 MG TABLET    Take 4 mg by mouth every 6 (six) hours as needed for nausea or vomiting.   OXYCODONE (OXY IR/ROXICODONE) 5 MG IMMEDIATE RELEASE TABLET    Take 1 tablet (5 mg total) by mouth  every 6 (six) hours as needed for moderate pain.   PREGABALIN (LYRICA) 50 MG CAPSULE    Take 50 mg by mouth daily.   SACCHAROMYCES BOULARDII (FLORASTOR) 250 MG CAPSULE    Take 250 mg by mouth 2 (two) times daily.   TRAZODONE (DESYREL) 100 MG TABLET    Take 100 mg by mouth at bedtime.  Modified Medications   No medications on file  Discontinued Medications   No medications on file     SIGNIFICANT DIAGNOSTIC EXAMS   07-06-15: bilateral hip and pelvis x-ray; Negative exam.  07-06-15: ct scan of  left hip: No evidence of fracture or dislocation. No significant joint effusion. No definite findings seen to suggest septic arthritis at this time  07-17-15: left elbow x-ray: 1. No evidence of acute fracture or dislocation. 2. Several small osseous fragments overlying the medial epicondyle, likely reflecting remote injury.   LABS REVIEWED:   07-07-15: wbc 9.2; hgb 9.8; hct 30.7; mcv 89.5; plt 260 glucose 123; bun 17; creat 1.29;  k+ 3.3; na++142; vitamin B12: 552; folate 9.2; iron 71; TIBC 253 07-09-15: wbc 7.7; hgb 9.7; hct 30.6; mcv 88.7; ;plt 290; glucose 118; bun 14; creat 0.99; k+ 3.6; na++142 07-17-15: wbc 10.0; hgb 11.3; hct 33.9; mcv 88.1; plt 318; glucose 112; bun 16; creat 1.13; k+ 3.7; na++142; sed rate 60; CRP 3.1     Review of Systems  Constitutional: Negative for malaise/fatigue.  Respiratory: Negative for cough and shortness of breath.   Cardiovascular: Negative for chest pain, palpitations and leg swelling.  Gastrointestinal: negative for diarrhea Negative for heartburn, nausea, abdominal pain, constipation and blood in stool.  Musculoskeletal: Negative for myalgias and joint pain.       Pain is being managed   Skin: Negative.   Neurological: Negative for headaches.  Psychiatric/Behavioral: The patient is not nervous/anxious.     Physical Exam  Constitutional: She is oriented to person, place, and time. She appears well-developed and well-nourished. No distress.  Morbidly  obese   Eyes: Conjunctivae are normal.  Neck: Neck supple. No JVD present. No thyromegaly present.  Cardiovascular: Normal rate, regular rhythm and intact distal pulses.   Respiratory: Effort normal and breath sounds normal. No respiratory distress. She has no wheezes.  GI: Soft. Bowel sounds are normal. She exhibits no distension. There is no tenderness.  Musculoskeletal: She exhibits no edema.  Able to move all extremities   Lymphadenopathy:    She has no cervical adenopathy.  Neurological: She is alert and oriented to person, place, and time.  Skin: Skin is warm and dry. She is not diaphoretic.  Psychiatric: She has a normal mood and affect.     ASSESSMENT/ PLAN:  Will discharge her to home with home health for pt/ot/rn to evaluate and treat as indicated for gait; balance; strength; adl training and medication management. She requires a standard wheelchair in order to maintain her current level of independence with her adl's which cannot be achieved with a walker. She can self propel. She requires a 3: commode; and 2 quad canes. Her prescriptions have been written for a 30 day supply of her medications with #30 oxycodone 5 mg tabs; lyrica 50 mg #30 tabs and belsommra 15 mg #30 tabs. Facility is to setup a follow up appointment with her pcp.    Time spent with patient  45  minutes >50% time spent counseling; reviewing medical record; tests; labs; and developing future plan of care   Synthia Innocent NP [facility]  Contact [phone] Monday through Friday 8am- 5pm  After hours call [phone]

## 2015-10-06 ENCOUNTER — Other Ambulatory Visit: Payer: Self-pay | Admitting: Adult Health

## 2015-10-10 ENCOUNTER — Other Ambulatory Visit: Payer: Self-pay | Admitting: Adult Health

## 2015-10-15 DIAGNOSIS — G8929 Other chronic pain: Secondary | ICD-10-CM | POA: Insufficient documentation

## 2015-11-20 ENCOUNTER — Other Ambulatory Visit: Payer: Self-pay | Admitting: Otolaryngology

## 2015-11-20 DIAGNOSIS — R221 Localized swelling, mass and lump, neck: Secondary | ICD-10-CM

## 2015-11-26 ENCOUNTER — Ambulatory Visit
Admission: RE | Admit: 2015-11-26 | Discharge: 2015-11-26 | Disposition: A | Discharge status: Home / Self Care | Payer: Medicaid Other | Source: Ambulatory Visit | Attending: Otolaryngology | Admitting: Otolaryngology

## 2015-11-26 DIAGNOSIS — R221 Localized swelling, mass and lump, neck: Secondary | ICD-10-CM

## 2015-11-26 MED ORDER — IOPAMIDOL (ISOVUE-300) INJECTION 61%
75.0000 mL | Freq: Once | INTRAVENOUS | Status: AC | PRN
  Administered 2015-11-26: 75 mL via INTRAVENOUS

## 2016-01-29 ENCOUNTER — Other Ambulatory Visit: Payer: Self-pay | Admitting: Otolaryngology

## 2016-02-05 ENCOUNTER — Encounter (HOSPITAL_COMMUNITY)
Admission: RE | Admit: 2016-02-05 | Discharge: 2016-02-05 | Disposition: A | Discharge status: Home / Self Care | Payer: Medicaid Other | Source: Ambulatory Visit | Attending: Otolaryngology | Admitting: Otolaryngology

## 2016-02-05 ENCOUNTER — Encounter (HOSPITAL_COMMUNITY): Payer: Self-pay

## 2016-02-05 ENCOUNTER — Other Ambulatory Visit (HOSPITAL_COMMUNITY): Payer: Self-pay | Admitting: *Deleted

## 2016-02-05 DIAGNOSIS — R001 Bradycardia, unspecified: Secondary | ICD-10-CM | POA: Diagnosis not present

## 2016-02-05 DIAGNOSIS — Z01812 Encounter for preprocedural laboratory examination: Secondary | ICD-10-CM | POA: Diagnosis not present

## 2016-02-05 DIAGNOSIS — G4730 Sleep apnea, unspecified: Secondary | ICD-10-CM | POA: Diagnosis not present

## 2016-02-05 DIAGNOSIS — Z01818 Encounter for other preprocedural examination: Secondary | ICD-10-CM | POA: Diagnosis present

## 2016-02-05 LAB — BASIC METABOLIC PANEL
Anion gap: 8 (ref 5–15)
BUN: 14 mg/dL (ref 6–20)
CHLORIDE: 111 mmol/L (ref 101–111)
CO2: 21 mmol/L — AB (ref 22–32)
Calcium: 9.4 mg/dL (ref 8.9–10.3)
Creatinine, Ser: 1.17 mg/dL — ABNORMAL HIGH (ref 0.44–1.00)
GFR calc Af Amer: >60 mL/min (ref >60)
GFR calc non Af Amer: 52 mL/min — ABNORMAL LOW (ref >60)
GLUCOSE: 108 mg/dL — AB (ref 65–99)
POTASSIUM: 3.8 mmol/L (ref 3.5–5.1)
Sodium: 140 mmol/L (ref 135–145)

## 2016-02-05 LAB — CBC
HEMATOCRIT: 38.9 % (ref 36.0–46.0)
Hemoglobin: 12.8 g/dL (ref 12.0–15.0)
MCH: 28.3 pg (ref 26.0–34.0)
MCHC: 32.9 g/dL (ref 30.0–36.0)
MCV: 86.1 fL (ref 78.0–100.0)
Platelets: 321 10*3/uL (ref 150–400)
RBC: 4.52 MIL/uL (ref 3.87–5.11)
RDW: 14.4 % (ref 11.5–15.5)
WBC: 7.9 10*3/uL (ref 4.0–10.5)

## 2016-02-05 LAB — GLUCOSE, CAPILLARY: Glucose-Capillary: 104 mg/dL — ABNORMAL HIGH (ref 65–99)

## 2016-02-05 NOTE — Pre-Procedure Instructions (Signed)
    Yesenia [name]  02/05/2016      [id number] [place], [place] - [address] [place] [address] Phone: [phone] Fax: [phone]  [id number] - [place], [place] - [address] AT CORNER OF [facility] [address] [place] [address] Phone: [phone] Fax: [phone]  [facility]- [place], [place] - [place], [place] - [address] [place] [address] Phone: [phone] Fax: [phone]    Your procedure is scheduled on Friday August 4th  Report to [facility] Admitting at 11:00  Call this number if you have problems the morning of surgery:  [phone]   Remember:  Do not eat food or drink liquids after midnight.  Take these medicines the morning of surgery with A SIP OF WATER: Albuterol inhaler if needed (bring with you), amlodipine (norvasc), atenolol (tenormin), gabapentin (neurontin), ondansetron (zofran) if needed, lyrica (pregabalin), trazodone (desyrel)  Stop taking all aspirin containing products, Nsaids (i.e. Ibuprofen, aleve, motrin, naproxen, advil), vitamins and herbal supplements including your probiotic   Do not wear jewelry, make-up or nail polish.  Do not wear lotions, powders, or perfumes.  You may not wear deoderant.  Do not shave 48 hours prior to surgery.   Do not bring valuables to the hospital.  [facility] is not responsible for any belongings or valuables.  Contacts, dentures or bridgework may not be worn into surgery.  Leave your suitcase in the car.  After surgery it may be brought to your room.  For patients admitted to the hospital, discharge time will be determined by your treatment team.   Special instructions: Shower with CHG the night before and morning of surgery as instructed  Please read over the following fact sheets that you were given. Shower instructions

## 2016-02-05 NOTE — Progress Notes (Signed)
Pt denies cardiologist or cardiac studies.  PCP is [facility].   Sleep study done 01/29/15 in media tab States she is unable to wear Cpap because of severe claustrophobia  Hx of "mild stroke" effecting right side but denies any current residual weakness

## 2016-02-06 LAB — HEMOGLOBIN A1C
Hgb A1c MFr Bld: 6.1 % — ABNORMAL HIGH (ref 4.8–5.6)
MEAN PLASMA GLUCOSE: 128 mg/dL

## 2016-02-13 ENCOUNTER — Encounter (HOSPITAL_COMMUNITY): Payer: Self-pay | Admitting: *Deleted

## 2016-02-13 ENCOUNTER — Encounter (HOSPITAL_COMMUNITY)
Admission: RE | Disposition: A | Discharge status: Skilled Nursing Facility | Payer: Self-pay | Source: Ambulatory Visit | Attending: Otolaryngology

## 2016-02-13 ENCOUNTER — Inpatient Hospital Stay (HOSPITAL_COMMUNITY)
Admission: RE | Admit: 2016-02-13 | Discharge: 2016-02-20 | DRG: 012 | Disposition: A | Discharge status: Skilled Nursing Facility | Payer: Medicaid Other | Source: Ambulatory Visit | Attending: Otolaryngology | Admitting: Otolaryngology

## 2016-02-13 ENCOUNTER — Inpatient Hospital Stay (HOSPITAL_COMMUNITY): Payer: Medicaid Other | Admitting: Anesthesiology

## 2016-02-13 DIAGNOSIS — K5900 Constipation, unspecified: Secondary | ICD-10-CM | POA: Diagnosis not present

## 2016-02-13 DIAGNOSIS — K219 Gastro-esophageal reflux disease without esophagitis: Secondary | ICD-10-CM | POA: Diagnosis present

## 2016-02-13 DIAGNOSIS — R109 Unspecified abdominal pain: Secondary | ICD-10-CM

## 2016-02-13 DIAGNOSIS — N183 Chronic kidney disease, stage 3 unspecified: Secondary | ICD-10-CM

## 2016-02-13 DIAGNOSIS — R1013 Epigastric pain: Secondary | ICD-10-CM | POA: Diagnosis present

## 2016-02-13 DIAGNOSIS — G4730 Sleep apnea, unspecified: Secondary | ICD-10-CM | POA: Diagnosis not present

## 2016-02-13 DIAGNOSIS — R5082 Postprocedural fever: Secondary | ICD-10-CM | POA: Diagnosis not present

## 2016-02-13 DIAGNOSIS — Z6841 Body Mass Index (BMI) 40.0 and over, adult: Secondary | ICD-10-CM | POA: Diagnosis not present

## 2016-02-13 DIAGNOSIS — E1142 Type 2 diabetes mellitus with diabetic polyneuropathy: Secondary | ICD-10-CM | POA: Diagnosis not present

## 2016-02-13 DIAGNOSIS — R059 Cough, unspecified: Secondary | ICD-10-CM

## 2016-02-13 DIAGNOSIS — E1122 Type 2 diabetes mellitus with diabetic chronic kidney disease: Secondary | ICD-10-CM | POA: Diagnosis present

## 2016-02-13 DIAGNOSIS — R05 Cough: Secondary | ICD-10-CM

## 2016-02-13 DIAGNOSIS — Z79899 Other long term (current) drug therapy: Secondary | ICD-10-CM

## 2016-02-13 DIAGNOSIS — Z885 Allergy status to narcotic agent status: Secondary | ICD-10-CM

## 2016-02-13 DIAGNOSIS — G4733 Obstructive sleep apnea (adult) (pediatric): Secondary | ICD-10-CM | POA: Diagnosis present

## 2016-02-13 DIAGNOSIS — E1140 Type 2 diabetes mellitus with diabetic neuropathy, unspecified: Secondary | ICD-10-CM | POA: Diagnosis present

## 2016-02-13 DIAGNOSIS — I129 Hypertensive chronic kidney disease with stage 1 through stage 4 chronic kidney disease, or unspecified chronic kidney disease: Secondary | ICD-10-CM | POA: Diagnosis present

## 2016-02-13 DIAGNOSIS — E785 Hyperlipidemia, unspecified: Secondary | ICD-10-CM | POA: Diagnosis present

## 2016-02-13 DIAGNOSIS — D649 Anemia, unspecified: Secondary | ICD-10-CM | POA: Diagnosis present

## 2016-02-13 DIAGNOSIS — R1010 Upper abdominal pain, unspecified: Secondary | ICD-10-CM | POA: Diagnosis not present

## 2016-02-13 DIAGNOSIS — R509 Fever, unspecified: Secondary | ICD-10-CM

## 2016-02-13 HISTORY — PX: TRACHEOSTOMY TUBE PLACEMENT: SHX814

## 2016-02-13 LAB — GLUCOSE, CAPILLARY
GLUCOSE-CAPILLARY: 140 mg/dL — AB (ref 65–99)
GLUCOSE-CAPILLARY: 133 mg/dL — AB (ref 65–99)

## 2016-02-13 LAB — MRSA PCR SCREENING: MRSA by PCR: NEGATIVE

## 2016-02-13 SURGERY — CREATION, TRACHEOSTOMY
Anesthesia: General

## 2016-02-13 MED ORDER — FERROUS SULFATE 325 (65 FE) MG PO TABS
325.0000 mg | ORAL_TABLET | Freq: Every day | ORAL | Status: DC
  Administered 2016-02-14 – 2016-02-20 (×7): 325 mg via ORAL
  Filled 2016-02-13 (×7): qty 1

## 2016-02-13 MED ORDER — PROMETHAZINE HCL 25 MG PO TABS
25.0000 mg | ORAL_TABLET | Freq: Four times a day (QID) | ORAL | Status: DC | PRN
  Administered 2016-02-15 – 2016-02-17 (×4): 25 mg via ORAL
  Filled 2016-02-13 (×4): qty 1

## 2016-02-13 MED ORDER — ONDANSETRON HCL 4 MG PO TABS
4.0000 mg | ORAL_TABLET | Freq: Four times a day (QID) | ORAL | Status: DC | PRN
  Administered 2016-02-14 – 2016-02-19 (×8): 4 mg via ORAL
  Filled 2016-02-13 (×10): qty 1

## 2016-02-13 MED ORDER — MIDAZOLAM HCL 2 MG/2ML IJ SOLN
INTRAMUSCULAR | Status: AC
  Filled 2016-02-13: qty 2

## 2016-02-13 MED ORDER — KCL IN DEXTROSE-NACL 20-5-0.45 MEQ/L-%-% IV SOLN
INTRAVENOUS | Status: DC
  Administered 2016-02-13 – 2016-02-17 (×12): via INTRAVENOUS
  Administered 2016-02-18: 125 mL/h via INTRAVENOUS
  Administered 2016-02-18: 15:00:00 via INTRAVENOUS
  Administered 2016-02-19: 125 mL/h via INTRAVENOUS
  Administered 2016-02-19 – 2016-02-20 (×2): via INTRAVENOUS
  Filled 2016-02-13 (×19): qty 1000

## 2016-02-13 MED ORDER — LIDOCAINE-EPINEPHRINE 1 %-1:100000 IJ SOLN
INTRAMUSCULAR | Status: DC | PRN
  Administered 2016-02-13: 4 mL

## 2016-02-13 MED ORDER — HYDROCHLOROTHIAZIDE 25 MG PO TABS
25.0000 mg | ORAL_TABLET | Freq: Every day | ORAL | Status: DC
  Administered 2016-02-14 – 2016-02-20 (×7): 25 mg via ORAL
  Filled 2016-02-13 (×7): qty 1

## 2016-02-13 MED ORDER — ALBUTEROL SULFATE (2.5 MG/3ML) 0.083% IN NEBU
2.5000 mg | INHALATION_SOLUTION | Freq: Four times a day (QID) | RESPIRATORY_TRACT | Status: DC | PRN
  Filled 2016-02-13: qty 3

## 2016-02-13 MED ORDER — HYDROMORPHONE HCL 1 MG/ML IJ SOLN
0.5000 mg | INTRAMUSCULAR | Status: DC | PRN
  Administered 2016-02-13 – 2016-02-20 (×34): 0.5 mg via INTRAVENOUS
  Filled 2016-02-13 (×34): qty 1

## 2016-02-13 MED ORDER — ATENOLOL 50 MG PO TABS
50.0000 mg | ORAL_TABLET | Freq: Every day | ORAL | Status: DC
Start: 2016-02-14 — End: 2016-02-20
  Administered 2016-02-14 – 2016-02-20 (×7): 50 mg via ORAL
  Filled 2016-02-13 (×4): qty 1
  Filled 2016-02-13: qty 2
  Filled 2016-02-13 (×2): qty 1

## 2016-02-13 MED ORDER — AMLODIPINE BESYLATE 10 MG PO TABS
10.0000 mg | ORAL_TABLET | Freq: Every morning | ORAL | Status: DC
  Administered 2016-02-14 – 2016-02-20 (×7): 10 mg via ORAL
  Filled 2016-02-13 (×7): qty 1

## 2016-02-13 MED ORDER — ONDANSETRON HCL 4 MG/2ML IJ SOLN
INTRAMUSCULAR | Status: AC
  Filled 2016-02-13: qty 2

## 2016-02-13 MED ORDER — FENTANYL CITRATE (PF) 100 MCG/2ML IJ SOLN
25.0000 ug | INTRAMUSCULAR | Status: DC | PRN
  Administered 2016-02-13: 25 ug via INTRAVENOUS
  Administered 2016-02-13: 50 ug via INTRAVENOUS

## 2016-02-13 MED ORDER — FENTANYL CITRATE (PF) 250 MCG/5ML IJ SOLN
INTRAMUSCULAR | Status: AC
  Filled 2016-02-13: qty 5

## 2016-02-13 MED ORDER — CHLORHEXIDINE GLUCONATE 0.12 % MT SOLN
15.0000 mL | Freq: Two times a day (BID) | OROMUCOSAL | Status: DC
  Administered 2016-02-14 – 2016-02-20 (×13): 15 mL via OROMUCOSAL
  Filled 2016-02-13 (×13): qty 15

## 2016-02-13 MED ORDER — SUCCINYLCHOLINE CHLORIDE 20 MG/ML IJ SOLN
INTRAMUSCULAR | Status: DC | PRN
  Administered 2016-02-13: 160 mg via INTRAVENOUS

## 2016-02-13 MED ORDER — FENTANYL CITRATE (PF) 100 MCG/2ML IJ SOLN
INTRAMUSCULAR | Status: AC
  Filled 2016-02-13: qty 2

## 2016-02-13 MED ORDER — ALBUTEROL SULFATE HFA 108 (90 BASE) MCG/ACT IN AERS: 2.0000 | INHALATION_SPRAY | Freq: Four times a day (QID) | RESPIRATORY_TRACT | Status: DC | PRN

## 2016-02-13 MED ORDER — IBUPROFEN 100 MG/5ML PO SUSP
600.0000 mg | Freq: Four times a day (QID) | ORAL | Status: DC | PRN
  Administered 2016-02-18 – 2016-02-19 (×2): 600 mg via ORAL
  Filled 2016-02-13 (×4): qty 30

## 2016-02-13 MED ORDER — TRAZODONE HCL 150 MG PO TABS
300.0000 mg | ORAL_TABLET | Freq: Every day | ORAL | Status: DC
  Administered 2016-02-14 – 2016-02-20 (×7): 300 mg via ORAL
  Filled 2016-02-13 (×7): qty 2

## 2016-02-13 MED ORDER — SUGAMMADEX SODIUM 500 MG/5ML IV SOLN
INTRAVENOUS | Status: DC | PRN
  Administered 2016-02-13: 300 mg via INTRAVENOUS

## 2016-02-13 MED ORDER — LACTATED RINGERS IV SOLN
INTRAVENOUS | Status: DC
  Administered 2016-02-13 (×2): via INTRAVENOUS

## 2016-02-13 MED ORDER — ACETAMINOPHEN 160 MG/5ML PO SOLN
650.0000 mg | Freq: Four times a day (QID) | ORAL | Status: DC | PRN
  Administered 2016-02-20: 650 mg via ORAL
  Filled 2016-02-13: qty 20.3

## 2016-02-13 MED ORDER — FENTANYL CITRATE (PF) 100 MCG/2ML IJ SOLN
INTRAMUSCULAR | Status: DC | PRN
  Administered 2016-02-13: 100 ug via INTRAVENOUS

## 2016-02-13 MED ORDER — PREGABALIN 50 MG PO CAPS
50.0000 mg | ORAL_CAPSULE | Freq: Every day | ORAL | Status: DC
  Administered 2016-02-14 – 2016-02-20 (×7): 50 mg via ORAL
  Filled 2016-02-13 (×7): qty 1

## 2016-02-13 MED ORDER — EPHEDRINE SULFATE 50 MG/ML IJ SOLN
INTRAMUSCULAR | Status: DC | PRN
  Administered 2016-02-13: 5 mg via INTRAVENOUS
  Administered 2016-02-13: 10 mg via INTRAVENOUS

## 2016-02-13 MED ORDER — SUGAMMADEX SODIUM 500 MG/5ML IV SOLN
INTRAVENOUS | Status: AC
  Filled 2016-02-13: qty 5

## 2016-02-13 MED ORDER — LIDOCAINE 2% (20 MG/ML) 5 ML SYRINGE
INTRAMUSCULAR | Status: AC
  Filled 2016-02-13: qty 5

## 2016-02-13 MED ORDER — ONDANSETRON HCL 4 MG/2ML IJ SOLN
INTRAMUSCULAR | Status: DC | PRN
  Administered 2016-02-13: 4 mg via INTRAVENOUS

## 2016-02-13 MED ORDER — ROCURONIUM BROMIDE 50 MG/5ML IV SOLN
INTRAVENOUS | Status: AC
  Filled 2016-02-13: qty 1

## 2016-02-13 MED ORDER — ROCURONIUM BROMIDE 100 MG/10ML IV SOLN
INTRAVENOUS | Status: DC | PRN
  Administered 2016-02-13: 10 mg via INTRAVENOUS

## 2016-02-13 MED ORDER — PROMETHAZINE HCL 25 MG/ML IJ SOLN
12.5000 mg | Freq: Four times a day (QID) | INTRAMUSCULAR | Status: DC | PRN
  Administered 2016-02-13 – 2016-02-20 (×11): 12.5 mg via INTRAVENOUS
  Filled 2016-02-13 (×11): qty 1

## 2016-02-13 MED ORDER — SACCHAROMYCES BOULARDII 250 MG PO CAPS
250.0000 mg | ORAL_CAPSULE | Freq: Two times a day (BID) | ORAL | Status: DC
  Administered 2016-02-14 – 2016-02-20 (×13): 250 mg via ORAL
  Filled 2016-02-13 (×14): qty 1

## 2016-02-13 MED ORDER — DEXAMETHASONE SODIUM PHOSPHATE 10 MG/ML IJ SOLN
INTRAMUSCULAR | Status: AC
  Filled 2016-02-13: qty 1

## 2016-02-13 MED ORDER — SUCCINYLCHOLINE CHLORIDE 200 MG/10ML IV SOSY
PREFILLED_SYRINGE | INTRAVENOUS | Status: AC
  Filled 2016-02-13: qty 20

## 2016-02-13 MED ORDER — PROPOFOL 10 MG/ML IV BOLUS
INTRAVENOUS | Status: DC | PRN
Start: 2016-02-13 — End: 2016-02-13
  Administered 2016-02-13: 200 mg via INTRAVENOUS

## 2016-02-13 MED ORDER — BACITRACIN ZINC 500 UNIT/GM EX OINT
1.0000 "application " | TOPICAL_OINTMENT | Freq: Two times a day (BID) | CUTANEOUS | Status: DC
  Administered 2016-02-14 – 2016-02-20 (×15): 1 via TOPICAL
  Filled 2016-02-13 (×2): qty 28.35

## 2016-02-13 MED ORDER — PROMETHAZINE HCL 25 MG RE SUPP
25.0000 mg | Freq: Four times a day (QID) | RECTAL | Status: DC | PRN
  Filled 2016-02-13: qty 1

## 2016-02-13 MED ORDER — CETYLPYRIDINIUM CHLORIDE 0.05 % MT LIQD
7.0000 mL | Freq: Two times a day (BID) | OROMUCOSAL | Status: DC
  Administered 2016-02-14 – 2016-02-20 (×10): 7 mL via OROMUCOSAL

## 2016-02-13 MED ORDER — LIDOCAINE-EPINEPHRINE 1 %-1:100000 IJ SOLN
INTRAMUSCULAR | Status: AC
  Filled 2016-02-13: qty 1

## 2016-02-13 MED ORDER — MIDAZOLAM HCL 5 MG/5ML IJ SOLN
INTRAMUSCULAR | Status: DC | PRN
  Administered 2016-02-13: 2 mg via INTRAVENOUS

## 2016-02-13 MED ORDER — PROPOFOL 10 MG/ML IV BOLUS
INTRAVENOUS | Status: AC
  Filled 2016-02-13: qty 20

## 2016-02-13 MED ORDER — GABAPENTIN 300 MG PO CAPS
300.0000 mg | ORAL_CAPSULE | Freq: Every day | ORAL | Status: DC
  Administered 2016-02-14 – 2016-02-19 (×6): 300 mg via ORAL
  Filled 2016-02-13 (×6): qty 1

## 2016-02-13 MED ORDER — DEXAMETHASONE SODIUM PHOSPHATE 10 MG/ML IJ SOLN
INTRAMUSCULAR | Status: DC | PRN
  Administered 2016-02-13: 10 mg via INTRAVENOUS

## 2016-02-13 MED ORDER — LIDOCAINE HCL (CARDIAC) 20 MG/ML IV SOLN
INTRAVENOUS | Status: DC | PRN
  Administered 2016-02-13: 80 mg via INTRAVENOUS

## 2016-02-13 SURGICAL SUPPLY — 41 items
BLADE SURG 15 STRL LF DISP TIS (BLADE) IMPLANT
BLADE SURG 15 STRL SS (BLADE)
BLADE SURG ROTATE 9660 (MISCELLANEOUS) IMPLANT
CANISTER SUCTION 2500CC (MISCELLANEOUS) ×2 IMPLANT
CLEANER TIP ELECTROSURG 2X2 (MISCELLANEOUS) ×2 IMPLANT
COVER SURGICAL LIGHT HANDLE (MISCELLANEOUS) ×2 IMPLANT
CRADLE DONUT ADULT HEAD (MISCELLANEOUS) IMPLANT
DECANTER SPIKE VIAL GLASS SM (MISCELLANEOUS) ×2 IMPLANT
DRAPE PROXIMA HALF (DRAPES) IMPLANT
ELECT COATED BLADE 2.86 ST (ELECTRODE) ×2 IMPLANT
ELECT REM PT RETURN 9FT ADLT (ELECTROSURGICAL) ×2
ELECTRODE REM PT RTRN 9FT ADLT (ELECTROSURGICAL) ×1 IMPLANT
GAUZE SPONGE 4X4 16PLY XRAY LF (GAUZE/BANDAGES/DRESSINGS) ×2 IMPLANT
GLOVE BIO SURGEON STRL SZ7.5 (GLOVE) ×2 IMPLANT
GOWN STRL REUS W/ TWL LRG LVL3 (GOWN DISPOSABLE) ×2 IMPLANT
GOWN STRL REUS W/TWL LRG LVL3 (GOWN DISPOSABLE) ×2
HOLDER TRACH TUBE VELCRO 19.5 (MISCELLANEOUS) ×2 IMPLANT
HOVERMATT SINGLE USE (MISCELLANEOUS) ×2 IMPLANT
KIT BASIN OR (CUSTOM PROCEDURE TRAY) ×2 IMPLANT
KIT ROOM TURNOVER OR (KITS) ×2 IMPLANT
KIT SUCTION CATH 14FR (SUCTIONS) IMPLANT
NEEDLE HYPO 25GX1X1/2 BEV (NEEDLE) IMPLANT
NS IRRIG 1000ML POUR BTL (IV SOLUTION) ×2 IMPLANT
PACK EENT II TURBAN DRAPE (CUSTOM PROCEDURE TRAY) ×2 IMPLANT
PAD ARMBOARD 7.5X6 YLW CONV (MISCELLANEOUS) ×4 IMPLANT
PENCIL BUTTON HOLSTER BLD 10FT (ELECTRODE) ×2 IMPLANT
SPONGE DRAIN TRACH 4X4 STRL 2S (GAUZE/BANDAGES/DRESSINGS) ×2 IMPLANT
SPONGE INTESTINAL PEANUT (DISPOSABLE) IMPLANT
SUT MON AB 3-0 SH 27 (SUTURE) ×1
SUT MON AB 3-0 SH27 (SUTURE) ×1 IMPLANT
SUT SILK 0 FSL (SUTURE) ×2 IMPLANT
SUT SILK 2 0 REEL (SUTURE) ×2 IMPLANT
SUT SILK 2 0 SH CR/8 (SUTURE) ×2 IMPLANT
SUT VIC AB 2-0 FS1 27 (SUTURE) IMPLANT
SYR 20ML ECCENTRIC (SYRINGE) ×2 IMPLANT
SYR BULB 3OZ (MISCELLANEOUS) ×2 IMPLANT
SYR CONTROL 10ML LL (SYRINGE) IMPLANT
TOWEL OR 17X24 6PK STRL BLUE (TOWEL DISPOSABLE) ×2 IMPLANT
TUBE CONNECTING 12X1/4 (SUCTIONS) ×2 IMPLANT
TUBE TRACH SHILEY  6 DIST  CUF (TUBING) ×2 IMPLANT
WATER STERILE IRR 1000ML POUR (IV SOLUTION) ×2 IMPLANT

## 2016-02-13 NOTE — Anesthesia Procedure Notes (Signed)
Procedure Name: Intubation Date/Time: 02/13/2016 1:37 PM Performed by: Army Fossa Pre-anesthesia Checklist: Patient identified, Emergency Drugs available, Suction available and Patient being monitored Patient Re-evaluated:Patient Re-evaluated prior to inductionOxygen Delivery Method: Circle System Utilized Preoxygenation: Pre-oxygenation with 100% oxygen Intubation Type: IV induction Ventilation: Mask ventilation without difficulty Laryngoscope Size: Glidescope and 4 Grade View: Grade II Tube type: Oral Tube size: 7.5 mm Number of attempts: 1 Airway Equipment and Method: Stylet,  Oral airway and Video-laryngoscopy Placement Confirmation: ETT inserted through vocal cords under direct vision,  positive ETCO2 and breath sounds checked- equal and bilateral Secured at: 22 cm Tube secured with: Tape Dental Injury: Teeth and Oropharynx as per pre-operative assessment

## 2016-02-13 NOTE — Brief Op Note (Signed)
02/13/2016  2:07 PM  PATIENT:  Yesenia [name]  55 y.o. female  PRE-OPERATIVE DIAGNOSIS:  Obstructive sleep apnea  POST-OPERATIVE DIAGNOSIS:  same  PROCEDURE:  Procedure(s): TRACHEOSTOMY (N/A)  SURGEON:  Surgeon(s) and Role:    * Christia Reading, MD - Primary  PHYSICIAN ASSISTANT:   ASSISTANTS: none   ANESTHESIA:   general  EBL:  No intake/output data recorded.  BLOOD ADMINISTERED:none  DRAINS: none   LOCAL MEDICATIONS USED:  LIDOCAINE   SPECIMEN:  No Specimen  DISPOSITION OF SPECIMEN:  N/A  COUNTS:  YES  TOURNIQUET:  * No tourniquets in log *  DICTATION: .Other Dictation: Dictation Number None given  PLAN OF CARE: Admit to inpatient   PATIENT DISPOSITION:  PACU - hemodynamically stable.   Delay start of Pharmacological VTE agent (>24hrs) due to surgical blood loss or risk of bleeding: yes

## 2016-02-13 NOTE — Anesthesia Postprocedure Evaluation (Signed)
Anesthesia Post Note  Patient: Yesenia [name]  Procedure(s) Performed: Procedure(s) (LRB): TRACHEOSTOMY (N/A)  Patient location during evaluation: PACU Anesthesia Type: General Level of consciousness: awake and alert Pain management: pain level controlled Vital Signs Assessment: post-procedure vital signs reviewed and stable Respiratory status: spontaneous breathing, nonlabored ventilation, respiratory function stable and patient connected to tracheostomy mask oxygen Cardiovascular status: blood pressure returned to baseline and stable Postop Assessment: no signs of nausea or vomiting Anesthetic complications: no    Last Vitals:  Vitals:   02/13/16 1530 02/13/16 1545  BP:  115/69  Pulse: 72 69  Resp: 16 20  Temp:      Last Pain:  Vitals:   02/13/16 1515  TempSrc:   PainSc: Asleep                 Yasir Kitner,W. EDMOND

## 2016-02-13 NOTE — Transfer of Care (Signed)
Immediate Anesthesia Transfer of Care Note  Patient: Yesenia [name]  Procedure(s) Performed: Procedure(s): TRACHEOSTOMY (N/A)  Patient Location: PACU  Anesthesia Type:General  Level of Consciousness:  sedated, patient cooperative and responds to stimulation  Airway & Oxygen Therapy:Patient Spontanous Breathing and Patient connected to face mask oxgen  Post-op Assessment:  Report given to PACU RN and Post -op Vital signs reviewed and stable  Post vital signs:  Reviewed and stable  Last Vitals:  Vitals:   02/13/16 1133  BP: 131/82  Pulse: 66  Resp: 18  Temp: 37.3 C    Complications: No apparent anesthesia complications

## 2016-02-13 NOTE — H&P (Signed)
Yesenia [name] is an 55 y.o. female.   Chief Complaint: sleep apnea HPI: 55 year old female with severe obstructive sleep apnea who cannot tolerate CPAP.  She presents for tracheostomy placement.  Past Medical History:  Diagnosis Date  . Anemia   . Anemia   . Chronic pain   . CKD (chronic kidney disease)   . Diabetes mellitus without complication (HCC)    diet controlled. does not check CBG's  . Fibromyalgia    hospitilized 12/16 due to inability to walk  . Hyperlipemia   . Hypertension   . Insomnia   . Neuropathy (HCC)   . Right sided weakness   . Stroke [facility]) 2014?   no residual weakness   . Vitamin deficiency    Vit D    Past Surgical History:  Procedure Laterality Date  . KNEE SURGERY  1992, 1995, 1997  . LEG SURGERY Right 2007   Surgery x3 2 arthroscopies and one rod  . OOPHORECTOMY Right 1993?    Family History  Problem Relation Age of Onset  . Breast cancer Mother   . Leukemia Brother   . Breast cancer Maternal Grandmother    Social History:  reports that she has never smoked. She has never used smokeless tobacco. She reports that she does not drink alcohol or use drugs.  Allergies:  Allergies  Allergen Reactions  . Buprenorphine Hcl Hives  . Morphine And Related Hives and Dermatitis    Medications Prior to Admission  Medication Sig Dispense Refill  . amLODipine (NORVASC) 10 MG tablet Take 10 mg by mouth every morning.     Marland Kitchen atenolol (TENORMIN) 50 MG tablet Take 50 mg by mouth daily.  3  . ferrous sulfate 325 (65 FE) MG tablet Take 325 mg by mouth daily.  6  . gabapentin (NEURONTIN) 300 MG capsule Take 300 mg by mouth at bedtime.    . hydrochlorothiazide (HYDRODIURIL) 25 MG tablet Take 25 mg by mouth daily.  3  . ondansetron (ZOFRAN) 4 MG tablet Take 4 mg by mouth every 6 (six) hours as needed for nausea or vomiting.    . pregabalin (LYRICA) 50 MG capsule Take 50 mg by mouth daily.    . traZODone (DESYREL) 150 MG tablet Take 300 mg by mouth daily.  0  .  albuterol (PROVENTIL HFA;VENTOLIN HFA) 108 (90 BASE) MCG/ACT inhaler Inhale 2 puffs into the lungs every 6 (six) hours as needed for wheezing.    . saccharomyces boulardii (FLORASTOR) 250 MG capsule Take 250 mg by mouth 2 (two) times daily.      Results for orders placed or performed during the hospital encounter of 02/13/16 (from the past 48 hour(s))  Glucose, capillary     Status: Abnormal   Collection Time: 02/13/16 11:41 AM  Result Value Ref Range   Glucose-Capillary 140 (H) 65 - 99 mg/dL   No results found.  Review of Systems  All other systems reviewed and are negative.   Blood pressure 131/82, pulse 66, temperature 99.1 F (37.3 C), temperature source Oral, resp. rate 18, height  (1.702 m), weight (!) 145.1 kg (319 lb 14.2 oz), SpO2 99 %. Physical Exam  Constitutional: She is oriented to person, place, and time. She appears well-developed and well-nourished. No distress.  HENT:  Head: Normocephalic and atraumatic.  Right Ear: External ear normal.  Left Ear: External ear normal.  Nose: Nose normal.  Mouth/Throat: Oropharynx is clear and moist.  Eyes: Conjunctivae and EOM are normal. Pupils are equal,  round, and reactive to light.  Neck: Normal range of motion. Neck supple.  Cardiovascular: Normal rate.   Respiratory: Effort normal.  Musculoskeletal: Normal range of motion.  Neurological: She is alert and oriented to person, place, and time. No cranial nerve deficit.  Skin: Skin is warm and dry.  Psychiatric: She has a normal mood and affect. Her behavior is normal. Judgment and thought content normal.     Assessment/Plan Severe obstructive sleep apnea To OR for tracheostomy.  Plan 4-5 day hospitalization.  Christia Reading, MD 02/13/2016, 1:01 PM

## 2016-02-13 NOTE — Anesthesia Preprocedure Evaluation (Addendum)
Anesthesia Evaluation  Patient identified by MRN, date of birth, ID band Patient awake    Reviewed: Allergy & Precautions, H&P , NPO status , Patient's Chart, lab work & pertinent test results, reviewed documented beta blocker date and time   Airway Mallampati: III  TM Distance: >3 FB Neck ROM: Full    Dental no notable dental hx. (+) Teeth Intact, Dental Advisory Given   Pulmonary sleep apnea ,    Pulmonary exam normal breath sounds clear to auscultation       Cardiovascular hypertension, Pt. on medications and Pt. on home beta blockers  Rhythm:Regular Rate:Normal     Neuro/Psych  Headaches, CVA, No Residual Symptoms    GI/Hepatic Neg liver ROS, GERD  Controlled,  Endo/Other  diabetesMorbid obesity  Renal/GU negative Renal ROS  negative genitourinary   Musculoskeletal  (+) Fibromyalgia -  Abdominal   Peds  Hematology negative hematology ROS (+)   Anesthesia Other Findings   Reproductive/Obstetrics negative OB ROS                            Anesthesia Physical Anesthesia Plan  ASA: III  Anesthesia Plan: General   Post-op Pain Management:    Induction: Intravenous  Airway Management Planned: Oral ETT and Video Laryngoscope Planned  Additional Equipment:   Intra-op Plan:   Post-operative Plan: Extubation in OR  Informed Consent: I have reviewed the patients History and Physical, chart, labs and discussed the procedure including the risks, benefits and alternatives for the proposed anesthesia with the patient or authorized representative who has indicated his/her understanding and acceptance.   Dental advisory given  Plan Discussed with: CRNA  Anesthesia Plan Comments:         Anesthesia Quick Evaluation

## 2016-02-13 NOTE — Progress Notes (Signed)
Patient ID: Yesenia [name], female   DOB: December 19, 1960, 55 y.o.   MRN: [id number] Complaining only of neck pain. AFVSS Alert, NAD Trach site stable with cuff inflated, no bleeding. A/P: Obstructive sleep apnea s/p tracheostomy Plan overnight observation in step-down unit with cuff inflated.  Likely deflate cuff tomorrow and start oral diet.  Likely transfer to regular room tomorrow.

## 2016-02-14 NOTE — Op Note (Signed)
Yesenia [name], Yesenia [name]                ACCOUNT NO.:  [id number]  MEDICAL RECORD NO.:  [id number]  LOCATION:  3S10C                        FACILITY:  [facility]  PHYSICIAN:  Antony Contras, MD     DATE OF BIRTH:  [birth year]  DATE OF PROCEDURE:  02/13/2016 DATE OF DISCHARGE:                              OPERATIVE REPORT   PREOPERATIVE DIAGNOSIS:  Severe obstructive sleep apnea.  POSTOPERATIVE DIAGNOSIS:  Severe obstructive sleep apnea.  PROCEDURE:  Tracheostomy.  SURGEON:  Antony Contras, MD.  ANESTHESIA:  General endotracheal anesthesia.  COMPLICATIONS:  None.  INDICATION:  The patient is a 55 year old female with severe sleep apnea who has not been able to tolerate CPAP and is not a good candidate for tongue based oropharyngeal surgery.  She presents for tracheostomy.  FINDINGS:  Anatomy of the anterior neck was normal.  DESCRIPTION PROCEDURE:  The patient was identified in the holding room and informed consent having been obtained after discussion of risks, benefits, alternatives; the patient was brought to the operative suite, put on the table in supine position.  Anesthesia was induced, and the patient was intubated by Anesthesia Team without difficulty.  The patient was not given antibiotics.  The eyes taped closed.  A shoulder roll was placed.  An anterior neck incision was marked with a marking pen and injected with 1% lidocaine with 1:100,000 epinephrine.  The anterior neck was then prepped and draped in sterile fashion.  Incision was made with a 15 blade scalpel and subcutaneous fat was then removed using Bovie electrocautery.  The midline raphe were identified and divided with the Bovie electrocautery.  Strap muscles were retracted to either side.  The cricoid was easily palpated.  The thyroid isthmus was divided with Bovie electrocautery, and the anterior tracheal wall was swept clean of soft tissue.  A horizontal incision was made between rings 2 and 3 of anterior  tracheal wall using 15 blade scalpel and extended with scissors.  A 2-0 silk stay suture was placed around a ring above and on one ring below the trach site.  The endotracheal tube cuff was deflated, and the tube backed up to above the tracheostomy site.  A #6 cuffed Shiley trach tube was placed into the trachea, and the cuff inflated.  The inner cannula was placed and the anesthesia circuit was attached.  The patient was successfully ventilated.  The stay sutures were tied with 2 knots above and 1 knot below the trach site. The patient was then cleaned off, and a trach dressing and Velcro trach tie were added.  The patient returned to anesthesia for wake up and was moved to recovery room in stable condition.     Antony Contras, MD     DDB/MEDQ  D:  02/13/2016  T:  02/14/2016  Job:  [id number]

## 2016-02-14 NOTE — Progress Notes (Signed)
Patient to transfer to 6N01 report given to receiving nurse.  Pt. VSS with no s/s of distress noted.  Patient stable for transfer.

## 2016-02-14 NOTE — Progress Notes (Signed)
SLP Cancellation Note  Patient Details Name: Yesenia [name] MRN: [id number] DOB: 26-May-1961   Cancelled treatment:       Reason Eval/Treat Not Completed: Other (comment) (Patient is not even 24 hours out from placement of the trach and policy dictates waiting 48 hours post op prior to placement of PMV.  ST will continue to follow for PMV placement.  )  Dimas Aguas, MA, CCC-SLP Acute Rehab SLP [phone] Fleet Contras 02/14/2016, 11:19 AM

## 2016-02-14 NOTE — Progress Notes (Signed)
1 Day Post-Op  Subjective: Did well overnight.  Some neck soreness this morning. Otherwise, no complaints.  Objective: Vital signs in last 24 hours: Temp:  [97.4 F (36.3 C)-99.1 F (37.3 C)] 98.6 F (37 C) (08/05 0700) Pulse Rate:  [61-98] 66 (08/05 0700) Resp:  [14-26] 26 (08/05 0815) BP: (96-131)/(55-97) 113/67 (08/05 0700) SpO2:  [94 %-100 %] 100 % (08/05 0700) FiO2 (%):  [28 %] 28 % (08/05 0815) Weight:  [145.1 kg (319 lb 14.2 oz)] 145.1 kg (319 lb 14.2 oz) (08/04 1133) Last BM Date: 02/12/16  Intake/Output from previous day: 08/04 0701 - 08/05 0700 In: 1233.3 [I.V.:1233.3] Out: 950 [Urine:900; Blood:50] Intake/Output this shift: No intake/output data recorded.  General appearance: alert, cooperative and no distress Neck: #6 cuffed Shiley trach tube in place, no bleeding, deflated cuff, able to make voice with tube occluded, not much secretions, able to drink water without aspiration  Lab Results:  No results for input(s): WBC, HGB, HCT, PLT in the last 72 hours. BMET No results for input(s): NA, K, CL, CO2, GLUCOSE, BUN, CREATININE, CALCIUM in the last 72 hours. PT/INR No results for input(s): LABPROT, INR in the last 72 hours. ABG No results for input(s): PHART, HCO3 in the last 72 hours.  Invalid input(s): PCO2, PO2  Studies/Results: No results found.  Anti-infectives: Anti-infectives    None      Assessment/Plan: s/p Procedure(s): TRACHEOSTOMY (N/A) Transfer to regular room.  Will leave cuff deflated.  Will start po liquids.  Will order P-M valve.  LOS: 1 day    Yesenia [name] 02/14/2016

## 2016-02-15 MED ORDER — ALUM & MAG HYDROXIDE-SIMETH 200-200-20 MG/5ML PO SUSP
30.0000 mL | Freq: Four times a day (QID) | ORAL | Status: DC | PRN
  Administered 2016-02-15: 30 mL via ORAL
  Filled 2016-02-15: qty 30

## 2016-02-15 MED ORDER — PANTOPRAZOLE SODIUM 40 MG PO TBEC
40.0000 mg | DELAYED_RELEASE_TABLET | Freq: Every day | ORAL | Status: DC
  Administered 2016-02-15 – 2016-02-20 (×6): 40 mg via ORAL
  Filled 2016-02-15 (×6): qty 1

## 2016-02-15 NOTE — Progress Notes (Signed)
2 Days Post-Op  Subjective: Complains of epigastric pain.  Not eating much.  Has not ambulated.  Objective: Vital signs in last 24 hours: Temp:  [98.9 F (37.2 C)-100.2 F (37.9 C)] 100.2 F (37.9 C) (08/06 0453) Pulse Rate:  [65-76] 76 (08/06 1211) Resp:  [16-22] 18 (08/06 1211) BP: (103-135)/(67-104) 135/104 (08/06 0453) SpO2:  [99 %-100 %] 99 % (08/06 1211) FiO2 (%):  [28 %] 28 % (08/06 1211) Last BM Date: 02/12/16  Intake/Output from previous day: 08/05 0701 - 08/06 0700 In: 4145.4 [P.O.:160; I.V.:3985.4] Out: 1025 [Urine:1025] Intake/Output this shift: Total I/O In: -  Out: 500 [Urine:500]  General appearance: alert, cooperative and no distress Neck: #6 cuffed Shiley trach tube in place, cuff deflated, site stable, mild yellow secretions  Lab Results:  No results for input(s): WBC, HGB, HCT, PLT in the last 72 hours. BMET No results for input(s): NA, K, CL, CO2, GLUCOSE, BUN, CREATININE, CALCIUM in the last 72 hours. PT/INR No results for input(s): LABPROT, INR in the last 72 hours. ABG No results for input(s): PHART, HCO3 in the last 72 hours.  Invalid input(s): PCO2, PO2  Studies/Results: No results found.  Anti-infectives: Anti-infectives    None      Assessment/Plan: s/p Procedure(s): TRACHEOSTOMY (N/A) Will treat epigastric pain with Maalox and PPI therapy.  Will request assistance to ambulate.  Speech pathology disregarded my order for a Passy-Muir valve yesterday evidently due to policy.  Hopefully, they will help with the valve today.  LOS: 2 days    Yesenia [name] 02/15/2016

## 2016-02-16 ENCOUNTER — Encounter (HOSPITAL_COMMUNITY): Payer: Self-pay | Admitting: Otolaryngology

## 2016-02-16 ENCOUNTER — Inpatient Hospital Stay (HOSPITAL_COMMUNITY): Payer: Medicaid Other

## 2016-02-16 DIAGNOSIS — K5900 Constipation, unspecified: Secondary | ICD-10-CM

## 2016-02-16 DIAGNOSIS — N183 Chronic kidney disease, stage 3 unspecified: Secondary | ICD-10-CM

## 2016-02-16 DIAGNOSIS — R05 Cough: Secondary | ICD-10-CM

## 2016-02-16 DIAGNOSIS — R1010 Upper abdominal pain, unspecified: Secondary | ICD-10-CM

## 2016-02-16 DIAGNOSIS — R059 Cough, unspecified: Secondary | ICD-10-CM

## 2016-02-16 DIAGNOSIS — R509 Fever, unspecified: Secondary | ICD-10-CM

## 2016-02-16 DIAGNOSIS — R5082 Postprocedural fever: Secondary | ICD-10-CM

## 2016-02-16 DIAGNOSIS — E1122 Type 2 diabetes mellitus with diabetic chronic kidney disease: Secondary | ICD-10-CM

## 2016-02-16 DIAGNOSIS — R109 Unspecified abdominal pain: Secondary | ICD-10-CM

## 2016-02-16 DIAGNOSIS — E1142 Type 2 diabetes mellitus with diabetic polyneuropathy: Secondary | ICD-10-CM

## 2016-02-16 HISTORY — DX: Constipation, unspecified: K59.00

## 2016-02-16 LAB — CBC
HCT: 34.5 % — ABNORMAL LOW (ref 36.0–46.0)
HEMOGLOBIN: 10.8 g/dL — AB (ref 12.0–15.0)
MCH: 28.1 pg (ref 26.0–34.0)
MCHC: 31.3 g/dL (ref 30.0–36.0)
MCV: 89.6 fL (ref 78.0–100.0)
Platelets: 309 10*3/uL (ref 150–400)
RBC: 3.85 MIL/uL — AB (ref 3.87–5.11)
RDW: 14.9 % (ref 11.5–15.5)
WBC: 14.5 10*3/uL — ABNORMAL HIGH (ref 4.0–10.5)

## 2016-02-16 LAB — COMPREHENSIVE METABOLIC PANEL
ALK PHOS: 83 U/L (ref 38–126)
ALT: 31 U/L (ref 14–54)
ANION GAP: 8 (ref 5–15)
AST: 27 U/L (ref 15–41)
Albumin: 3 g/dL — ABNORMAL LOW (ref 3.5–5.0)
BILIRUBIN TOTAL: 0.7 mg/dL (ref 0.3–1.2)
BUN: 8 mg/dL (ref 6–20)
CALCIUM: 9 mg/dL (ref 8.9–10.3)
CO2: 27 mmol/L (ref 22–32)
Chloride: 105 mmol/L (ref 101–111)
Creatinine, Ser: 1.03 mg/dL — ABNORMAL HIGH (ref 0.44–1.00)
GFR calc non Af Amer: >60 mL/min (ref >60)
GLUCOSE: 136 mg/dL — AB (ref 65–99)
Potassium: 3.8 mmol/L (ref 3.5–5.1)
Sodium: 140 mmol/L (ref 135–145)
TOTAL PROTEIN: 6.6 g/dL (ref 6.5–8.1)

## 2016-02-16 LAB — URINALYSIS, ROUTINE W REFLEX MICROSCOPIC
BILIRUBIN URINE: NEGATIVE
GLUCOSE, UA: NEGATIVE mg/dL
HGB URINE DIPSTICK: NEGATIVE
KETONES UR: NEGATIVE mg/dL
Leukocytes, UA: NEGATIVE
Nitrite: NEGATIVE
PROTEIN: NEGATIVE mg/dL
Specific Gravity, Urine: 1.015 (ref 1.005–1.030)
pH: 5.5 (ref 5.0–8.0)

## 2016-02-16 LAB — LIPASE, BLOOD: Lipase: 19 U/L (ref 11–51)

## 2016-02-16 LAB — AMYLASE: Amylase: 40 U/L (ref 28–100)

## 2016-02-16 MED ORDER — LIDOCAINE 5 % EX PTCH
1.0000 | MEDICATED_PATCH | CUTANEOUS | Status: DC
  Administered 2016-02-16 – 2016-02-20 (×5): 1 via TRANSDERMAL
  Filled 2016-02-16 (×5): qty 1

## 2016-02-16 NOTE — Progress Notes (Signed)
3 Days Post-Op  Subjective: Continues with epigastric pain, medicines have not helped much.  Still not ambulating.  Passy-Muir valve has not been brought yet.  Objective: Vital signs in last 24 hours: Temp:  [98.9 F (37.2 C)-100.2 F (37.9 C)] 99.5 F (37.5 C) (08/07 0518) Pulse Rate:  [72-95] 94 (08/07 1001) Resp:  [18-20] 18 (08/07 1001) BP: (120-126)/(52-81) 126/52 (08/07 0518) SpO2:  [93 %-100 %] 94 % (08/07 1001) FiO2 (%):  [28 %] 28 % (08/07 1001) Last BM Date: 02/12/16  Intake/Output from previous day: 08/06 0701 - 08/07 0700 In: 2675 [P.O.:300; I.V.:2375] Out: 3050 [Urine:3050] Intake/Output this shift: No intake/output data recorded.  General appearance: alert, cooperative and no distress Neck: #6 cuffed Shiley in place, cuff deflated, site stable, coughing out yellow secretions, not making voice around blocked tube  Lab Results:  No results for input(s): WBC, HGB, HCT, PLT in the last 72 hours. BMET No results for input(s): NA, K, CL, CO2, GLUCOSE, BUN, CREATININE, CALCIUM in the last 72 hours. PT/INR No results for input(s): LABPROT, INR in the last 72 hours. ABG No results for input(s): PHART, HCO3 in the last 72 hours.  Invalid input(s): PCO2, PO2  Studies/Results: No results found.  Anti-infectives: Anti-infectives    None      Assessment/Plan: s/p Procedure(s): TRACHEOSTOMY (N/A) Will ask hospitalist to see her today regarding epigastric pain.  Ambulate today.  Hopefully, speech pathology will bring the Passy-Muir valve today.  LOS: 3 days    Yesenia [name] 02/16/2016

## 2016-02-16 NOTE — Care Management Note (Addendum)
Case Management Note  Patient Details  Name: Yesenia [name] MRN: [id number] Date of Birth: [birth year]  Subjective/Objective:                  Spoke to patient and Dr Jenne PaneBates at bedside . Patient lives alone has daughter , asked patient if at discharge she could stay with daughter or daughter could stay with her . Patient communicated by writing , and wrote "no", daughter lives 30 minutes away. Explained Respir Therapy will teach her and daughter trach care including suctioning . Patient stated she does not have any other family or friends who are able to help her at discharge.   Spoke with Terri in respiratory , they will call daughter and begin teaching.   Explained to patient she will have to learn to suction herself in case of emergency at home. Patient wrote that she unstood.    Patient aware portable suction machine will be delivered to room before discharge , ambu bag and number 4 trach .    [facility] , she will talk to patient and her daughter , patient will have to have someone with her at least the first couple of days for emergency back up. Darl PikesSusan will talk to patient and daughter .  Daughter unable to assist patient at discharge. Without initial help at discharge unable to set up home health due to safely concerns. Consulted SW  Action/Plan:   Expected Discharge Date:                  Expected Discharge Plan:  Home w Home Health Services  In-House Referral:     Discharge planning Services  CM Consult  Post Acute Care Choice:  Durable Medical Equipment, Home Health Choice offered to:  Patient  DME Arranged:  Trach supplies, Suction DME Agency:  [facility]  HH Arranged:  RN, Speech Therapy, Social Work, Interior and spatial designerespirator Therapy HH Agency:  [facility]  Status of Service:  In process, will continue to follow  If discussed at Long Length of Stay Meetings, dates discussed:    Additional Comments:  [name], [name],  RN 02/16/2016, 10:33 AM

## 2016-02-16 NOTE — Progress Notes (Signed)
Patient ambulated with mobility team and nruse [id number]

## 2016-02-16 NOTE — Progress Notes (Signed)
[facility]   New referral - Spoke with Freeman Caldronatrina Howell, Ms. Sloan's daughter about post discharge care.  She states she will have difficulty providing care at discharge.  Shared this with Izell CarolinaHeather Wiles, RN, CM.    If patient discharges after hours, please call [phone].   Josph MachoSusan Hamilton [address] 02/16/2016, 3:12 PM

## 2016-02-16 NOTE — Evaluation (Signed)
Passy-Muir Speaking Valve - Evaluation Patient Details  Name: Yesenia [name] MRN: [id number] Date of Birth: 08/02/1960  Today's Date: 02/16/2016 Time: 1432- 1500    Past Medical History:  Past Medical History:  Diagnosis Date  . Anemia   . Anemia   . Chronic pain   . CKD (chronic kidney disease)   . Diabetes mellitus without complication (HCC)    diet controlled. does not check CBG's  . Fibromyalgia    hospitilized 12/16 due to inability to walk  . Hyperlipemia   . Hypertension   . Insomnia   . Neuropathy (HCC)   . Right sided weakness   . Stroke [facility](HCC) 2014?   no residual weakness   . Vitamin deficiency    Vit D   Past Surgical History:  Past Surgical History:  Procedure Laterality Date  . KNEE SURGERY  1992, 1995, 1997  . LEG SURGERY Right 2007   Surgery x3 2 arthroscopies and one rod  . OOPHORECTOMY Right 1993?  . TRACHEOSTOMY TUBE PLACEMENT N/A 02/13/2016   Procedure: TRACHEOSTOMY;  Surgeon: Christia Readingwight Bates, MD;  [facility] OR;  Service: ENT;  Laterality: N/A;   HPI:  Yesenia [name] an 55 y.o.femalewith multiple medical problems not limited to CKD, DM2, HTN, severe OSA, fibromyalgia and obesity. Patient has severe OSA. She could not tolerate cpap. S/p tracheostomy 8/5.     Assessment / Plan / Recommendation Clinical Impression  Initial evaluation complete. Valve placed with limited upper airway patency noted, suspect post-op upper airway edema vs secretions (mild but thick) as patient reported no voice issues prior to surgery. Following cues for hard cough/throat clear to clear potential secretions, patient able to acheive low intensity,  breathy phonation at the single word level however within 5-7 seconds of valve placement, evidence of air trapping noted characterized by increased use of accessory muscles, increased RR, and loud burst of air from trach hub with valve removal. Deep suctioning by RN did not change outcome. Recommend valve use with SLP only. Will continue  to f/u. If not improvement over the next 48-72 hours with improving secretions,  trach size and or presence of cuff may be a limiting factor. Will f/u.     SLP Assessment  Patient needs continued Speech Lanaguage Pathology Services    Follow Up Recommendations   (TBD)    Frequency and Duration min 2x/week  2 weeks    PMSV Trial PMSV was placed for: 6-7 seconds Able to redirect subglottic air through upper airway: Yes Able to Attain Phonation: Yes Voice Quality: Low vocal intensity;Breathy;Hoarse (pitch breaks) Able to Expectorate Secretions: Yes Level of Secretion Expectoration with PMSV: Tracheal Breath Support for Phonation:  (TBD) Intelligibility: Intelligibility reduced Word: 75-100% accurate Phrase: 50-74% accurate Sentence: 25-49% accurate Conversation: 0-24% accurate Respirations During Trial:  (WNL) SpO2 During Trial: 100 % Pulse During Trial: 81 Behavior: Alert;Controlled;Cooperative   Tracheostomy Tube  Additional Tracheostomy Tube Assessment Fenestrated: No Trach Collar Period: 24 hours Secretion Description: min-mod Level of Secretion Expectoration: Tracheal    Vent Dependency  Vent Dependent: No FiO2 (%): 28 %    Cuff Deflation Trial  GO  Yesenia Erler MA, CCC-SLP [phone]  Tolerated Cuff Deflation:  (deflated at baseline) Behavior: Alert;Controlled;Cooperative        Yesenia [name] 02/16/2016, 3:17 PM

## 2016-02-16 NOTE — Consult Note (Signed)
Medical Consultation   Yesenia [name]  WUJ:[id number]  DOB: Dec 26, 1960  DOA: 02/13/2016  PCP: Egbert Garibaldi, NP  Requesting physician: Christia Reading, MD  Reason for consultation: epigastric pain   History of Present Illness: Yesenia [name] is an 55 y.o. female with multiple medical problems not limited to CKD, DM2, HTN, severe OSA, fibromyalgia and obesity. Patient has severe OSA. She could not tolerate cpap and underwent trach placement 2 days ago. Yesterday patient reported upper abdominal pain, PPI and maalox given without much improvement in symptoms. Patient has never had this type of pain before which she describes as related to movement (turning , coughing). Pain does radiate straight through to her back. She has had some episodes of nausea relieved with medication.    Review of Systems:  ROS + for constipation. No BM since prior to admission 3 days ago but has been NPO. As per HPI otherwise 10 point review of systems negative.   Past Medical History: Past Medical History:  Diagnosis Date  . Anemia   . Anemia   . Chronic pain   . CKD (chronic kidney disease)   . Diabetes mellitus without complication (HCC)    diet controlled. does not check CBG's  . Fibromyalgia    hospitilized 12/16 due to inability to walk  . Hyperlipemia   . Hypertension   . Insomnia   . Neuropathy (HCC)   . Right sided weakness   . Stroke [facility]) 2014?   no residual weakness   . Vitamin deficiency    Vit D    Past Surgical History: Past Surgical History:  Procedure Laterality Date  . KNEE SURGERY  1992, 1995, 1997  . LEG SURGERY Right 2007   Surgery x3 2 arthroscopies and one rod  . OOPHORECTOMY Right 1993?  . TRACHEOSTOMY TUBE PLACEMENT N/A 02/13/2016   Procedure: TRACHEOSTOMY;  Surgeon: Christia Reading, MD;  Location: [facility] OR;  Service: ENT;  Laterality: N/A;   Allergies:   Allergies  Allergen Reactions  . Buprenorphine Hcl Hives  . Morphine And Related Hives and  Dermatitis    Social History:  reports that she has never smoked. She has never used smokeless tobacco. She reports that she does not drink alcohol or use drugs.  Family History: Family History  Problem Relation Age of Onset  . Breast cancer Mother   . Leukemia Brother   . Breast cancer Maternal Grandmother     Physical Exam: Vitals:   02/15/16 2300 02/16/16 0432 02/16/16 0518 02/16/16 1001  BP:   (!) 126/52   Pulse: 87 95 94 94  Resp: Temp:   99.5 F (37.5 C)   TempSrc:   Oral   SpO2: 97% 93% 100% 94%  Weight:      Height:        Constitutional: Pleasant moridly obese white female who is alert and awake, oriented x3, not in any acute distress. Eyes: PERLA, EOMI, irises appear normal, anicteric sclera,  ENMT: external ears and nose appear normal, normal hearing.  Lips appears normal,  Tongue appears normal .  Neck: trach collar, normal ROM,  CVS: S1-S2 clear, no murmur rubs or gallops, no LE edema, normal pedal pulses  Respiratory:  Trach tube in place. Congested cough, difficult to take deep breath without coughing. No wheezing.  Abdomen: soft, obese, mild-moderate tenderness mid upper abdomen. Normal bowel sounds,  Musculoskeletal: : no cyanosis, clubbing  or edema noted bilaterally. Neuro: Cranial nerves II-XII intact, strength, sensation, reflexes Psych: judgement and insight appear normal, stable mood and affect, mental status Skin: no rashes or lesions or ulcers, no induration or nodules   Data reviewed:  I have personally reviewed following labs and imaging studies Labs:  CBC: No results for input(s): WBC, NEUTROABS, HGB, HCT, MCV, PLT in the last 168 hours.  Basic Metabolic Panel: No results for input(s): NA, K, CL, CO2, GLUCOSE, BUN, CREATININE, CALCIUM, MG, PHOS in the last 168 hours. GFR Estimated Creatinine Clearance: 82.4 mL/min (by C-G formula based on SCr of 1.17 mg/dL). Liver Function Tests: No results for input(s): AST, ALT, ALKPHOS,  BILITOT, PROT, ALBUMIN in the last 168 hours. No results for input(s): LIPASE, AMYLASE in the last 168 hours. No results for input(s): AMMONIA in the last 168 hours. Coagulation profile No results for input(s): INR, PROTIME in the last 168 hours.  Cardiac Enzymes: No results for input(s): CKTOTAL, CKMB, CKMBINDEX, TROPONINI in the last 168 hours. BNP: Invalid input(s): POCBNP CBG:  Recent Labs Lab 02/13/16 1141 02/13/16 1445  GLUCAP 140* 133*   D-Dimer No results for input(s): DDIMER in the last 72 hours. Hgb A1c No results for input(s): HGBA1C in the last 72 hours. Lipid Profile No results for input(s): CHOL, HDL, LDLCALC, TRIG, CHOLHDL, LDLDIRECT in the last 72 hours. Thyroid function studies No results for input(s): TSH, T4TOTAL, T3FREE, THYROIDAB in the last 72 hours.  Invalid input(s): FREET3 Anemia work up No results for input(s): VITAMINB12, FOLATE, FERRITIN, TIBC, IRON, RETICCTPCT in the last 72 hours.  Microbiology Recent Results (from the past 240 hour(s))  MRSA PCR Screening     Status: None   Collection Time: 02/13/16  4:40 PM  Result Value Ref Range Status   MRSA by PCR NEGATIVE NEGATIVE Final    Comment:        The GeneXpert MRSA Assay (FDA approved for NASAL specimens only), is one component of a comprehensive MRSA colonization surveillance program. It is not intended to diagnose MRSA infection nor to guide or monitor treatment for MRSA infections.      Inpatient Medications:   Scheduled Meds: . amLODipine  10 mg Oral q morning - 10a  . antiseptic oral rinse  7 mL Mouth Rinse q12n4p  . atenolol  50 mg Oral Daily  . bacitracin  1 application Topical BID  . chlorhexidine  15 mL Mouth Rinse BID  . ferrous sulfate  325 mg Oral Daily  . gabapentin  300 mg Oral QHS  . hydrochlorothiazide  25 mg Oral Daily  . pantoprazole  40 mg Oral Daily  . pregabalin  50 mg Oral Daily  . saccharomyces boulardii  250 mg Oral BID  . traZODone  300 mg Oral Daily    Continuous Infusions: . dextrose 5 % and 0.45 % NaCl with KCl 20 mEq/L 125 mL/hr at 02/16/16 0358    Radiological Exams on Admission: No results found.  Impression/Recommendations  Upper abdominal pain. Radiates through to back and related to movement (coughing, turning).  -Suspect musculoskeletal pain but will obtain basic labs (CMP, CBC, amylase, lipase)   -Trial of lidoderm patch to mid upper abdomen. If pain doesn't improve or labs return abnormal will obtain abdominal imaging -would continue PPI for now.   Low grade temp. Congested cough.  -CXR, u/a -trial of mucinex with dextromethorphan? Contraindicated with post-op trach?  Hypertension, currently controlled  Severe OSA, s/p trach 2 days ago  DM2, diet controlled. Recent A1c  6.1  Constipation, last BM 3 days ago but has been NPO.  -Daily Miralax when tolerating PO  CKD 3. Creatine at baseline at last check   Thank you for this consultation.  Our [facility] hospitalist team will follow the patient with you.   Time Spent: 45 minutes  Willette Cluster M.D. [facility] 02/16/2016, 11:13 AM

## 2016-02-17 DIAGNOSIS — G4730 Sleep apnea, unspecified: Secondary | ICD-10-CM

## 2016-02-17 LAB — GLUCOSE, CAPILLARY: Glucose-Capillary: 132 mg/dL — ABNORMAL HIGH (ref 65–99)

## 2016-02-17 MED ORDER — INSULIN ASPART 100 UNIT/ML ~~LOC~~ SOLN
0.0000 [IU] | Freq: Three times a day (TID) | SUBCUTANEOUS | Status: DC
  Administered 2016-02-17 – 2016-02-20 (×7): 1 [IU] via SUBCUTANEOUS

## 2016-02-17 NOTE — NC FL2 (Signed)
[place] MEDICAID FL2 LEVEL OF CARE SCREENING TOOL     IDENTIFICATION  Patient Name: Yesenia [name] Birthdate: Nov 11, 1960 Sex: female Admission Date (Current Location): 02/13/2016  [facility] and [place] Number:  Producer, television/film/video and Address:  [facility], [address] [place], [place] [address]      Provider Number: [id number]  Attending Physician Name and Address:  Christia Reading, MD  Relative Name and Phone Number:       Current Level of Care: Hospital Recommended Level of Care: Skilled Nursing Facility Prior Approval Number:    Date Approved/Denied:   PASRR Number: [id number]  Discharge Plan: SNF    Current Diagnoses: Patient Active Problem List   Diagnosis Date Noted  . Upper abdominal pain 02/16/2016  . Constipation 02/16/2016  . CKD (chronic kidney disease) stage 3, GFR 30-59 ml/min 02/16/2016  . Fever   . Cough   . Sleep apnea 02/13/2016  . Insomnia 08/10/2015  . Polyneuropathy (HCC) 07/10/2015  . GERD (gastroesophageal reflux disease) 07/10/2015  . Acute kidney injury superimposed on chronic kidney disease (HCC) 07/07/2015  . Gait disturbance 07/07/2015  . Morbid obesity (HCC) 07/07/2015  . Left hip pain 07/06/2015  . Chronic pain   . Right sided weakness   . Headache(784.0) 04/14/2013  . Encephalopathy acute 04/11/2013  . Acute pyelonephritis 04/11/2013  . Diabetes mellitus, type 2 (HCC) 04/11/2013  . Dyslipidemia 04/11/2013  . CVA (cerebral infarction) 01/14/2013  . HTN (hypertension) 01/14/2013  . Anemia 01/14/2013    Orientation RESPIRATION BLADDER Height & Weight     Self, Time, Situation, Place  Tracheostomy (28% Cuffless 4mm) Continent Weight: (!) 319 lb 14.2 oz (145.1 kg) Height:   (170.2 cm)  BEHAVIORAL SYMPTOMS/MOOD NEUROLOGICAL BOWEL NUTRITION STATUS      Continent Heart Healthy / Carb Modified - Thin Liquid  AMBULATORY STATUS COMMUNICATION OF NEEDS Skin   Limited Assist Verbally                          Personal Care Assistance Level of Assistance  Bathing, Feeding, Dressing Bathing Assistance: Independent Feeding assistance: Independent Dressing Assistance: Independent     Functional Limitations Info  Sight, Hearing, Speech Sight Info: Adequate Hearing Info: Adequate Speech Info: Adequate    SPECIAL CARE FACTORS FREQUENCY  PT (By licensed PT), OT (By licensed OT), Speech therapy     PT Frequency: 3 OT Frequency: 3     Speech Therapy Frequency: 3      Contractures Contractures Info: Not present    Additional Factors Info  Code Status, Allergies, Psychotropic, Suctioning Needs Code Status Info: Full Code Allergies Info: Buprenorphine Hcl, Morphine And Related Psychotropic Info: Lyrica     Suctioning Needs: Patient able to suction self as needed   Current Medications (02/17/2016):  This is the current hospital active medication list Current Facility-Administered Medications  Medication Dose Route Frequency Provider Last Rate Last Dose  . acetaminophen (TYLENOL) solution 650 mg  650 mg Oral Q6H PRN Christia Reading, MD      . albuterol (PROVENTIL) (2.5 MG/3ML) 0.083% nebulizer solution 2.5 mg  2.5 mg Nebulization Q6H PRN Christia Reading, MD      . alum & mag hydroxide-simeth (MAALOX/MYLANTA) 200-200-20 MG/5ML suspension 30 mL  30 mL Oral Q6H PRN Christia Reading, MD   30 mL at 02/15/16 2209  . amLODipine (NORVASC) tablet 10 mg  10 mg Oral q morning - 10a Christia Reading, MD   10  mg at 02/17/16 1056  . antiseptic oral rinse (CPC / CETYLPYRIDINIUM CHLORIDE 0.05%) solution 7 mL  7 mL Mouth Rinse q12n4p Christia Readingwight Bates, MD   7 mL at 02/16/16 1200  . atenolol (TENORMIN) tablet 50 mg  50 mg Oral Daily Christia Readingwight Bates, MD   50 mg at 02/17/16 1056  . bacitracin ointment 1 application  1 application Topical BID Christia Readingwight Bates, MD   1 application at 02/17/16 1000  . chlorhexidine (PERIDEX) 0.12 % solution 15 mL  15 mL Mouth Rinse BID Christia Readingwight Bates, MD   15 mL at 02/17/16 1056  . dextrose 5 % and  0.45 % NaCl with KCl 20 mEq/L infusion   Intravenous Continuous Christia Readingwight Bates, MD 125 mL/hr at 02/16/16 1932    . ferrous sulfate tablet 325 mg  325 mg Oral Daily Christia Readingwight Bates, MD   325 mg at 02/17/16 1056  . gabapentin (NEURONTIN) capsule 300 mg  300 mg Oral QHS Christia Readingwight Bates, MD   300 mg at 02/16/16 2139  . hydrochlorothiazide (HYDRODIURIL) tablet 25 mg  25 mg Oral Daily Christia Readingwight Bates, MD   25 mg at 02/17/16 1056  . HYDROmorphone (DILAUDID) injection 0.5 mg  0.5 mg Intravenous Q2H PRN Christia Readingwight Bates, MD   0.5 mg at 02/17/16 0908  . ibuprofen (ADVIL,MOTRIN) 100 MG/5ML suspension 600 mg  600 mg Oral Q6H PRN Christia Readingwight Bates, MD      . lidocaine (LIDODERM) 5 % 1 patch  1 patch Transdermal Q24H Meredith PelPaula M Guenther, NP   1 patch at 02/16/16 1213  . ondansetron (ZOFRAN) tablet 4 mg  4 mg Oral Q6H PRN Christia Readingwight Bates, MD   4 mg at 02/16/16 2139  . pantoprazole (PROTONIX) EC tablet 40 mg  40 mg Oral Daily Christia Readingwight Bates, MD   40 mg at 02/17/16 1056  . pregabalin (LYRICA) capsule 50 mg  50 mg Oral Daily Christia Readingwight Bates, MD   50 mg at 02/17/16 1056  . promethazine (PHENERGAN) injection 12.5 mg  12.5 mg Intravenous Q6H PRN Christia Readingwight Bates, MD   12.5 mg at 02/16/16 0930  . promethazine (PHENERGAN) tablet 25 mg  25 mg Oral Q6H PRN Christia Readingwight Bates, MD   25 mg at 02/17/16 0908   Or  . promethazine (PHENERGAN) suppository 25 mg  25 mg Rectal Q6H PRN Christia Readingwight Bates, MD      . saccharomyces boulardii (FLORASTOR) capsule 250 mg  250 mg Oral BID Christia Readingwight Bates, MD   250 mg at 02/17/16 1056  . traZODone (DESYREL) tablet 300 mg  300 mg Oral Daily Christia Readingwight Bates, MD   300 mg at 02/17/16 1056     Discharge Medications: Please see discharge summary for a list of discharge medications.  Relevant Imaging Results:  Relevant Lab Results:   Additional Information SSN [id number]   Macario GoldsJesse Maygen [place], [place] [phone]

## 2016-02-17 NOTE — Progress Notes (Signed)
PROGRESS NOTE    Yesenia [name]  XBJ:[id number] DOB: 19-[name]-1961 DOA: 02/13/2016 PCP: Egbert GaribaldiMillsaps, KIMBERLY M, NP   Brief Narrative:  this is a 55 year old female who is postop day 2 from tracheostomy placement, in addition to chronic kidney disease, diabetes type 2, hypertension, obesity, fibromyalgia reports 2 days of abdominal pain in the epigastric area which were not improved with Protonix and Maalox, referred to medical service for evaluation of abdominal pain.   Assessment & Plan:   Active Problems:   Diabetes mellitus, type 2 (HCC)   Sleep apnea   Upper abdominal pain   Constipation   CKD (chronic kidney disease) stage 3, GFR 30-59 ml/min   Fever   Cough   Obstructive sleep apnea: S/p tracheostomy  By ENT.  Further evaluation by primary.    Diabetes Mellitus: CBG (last 3)  No results for input(s): GLUCAP in the last 72 hours.  Start SSI.    Upper abdominal pain:  Epigastric pain, improved.  Lipase normal.  CMP within normal limits.  US abdomen ordered.     Anemia; Normocytic.   Stage 3 CKD; Appears at baseline.      DVT prophylaxis: (Lovenox) Code Status: FULL CODE.  Family Communication: none at bedside Disposition Plan: pending further management by primary.      Procedures:  tracheostomy 02/14/2016   Antimicrobials:  none  Subjective: No new complaints.   Objective: Vitals:   02/17/16 0536 02/17/16 0844 02/17/16 1128 02/17/16 1422  BP: 116/84   (!) 91/53  Pulse: 84 78 85 71  Resp: 18 18 18 18   Temp: 97.7 F (36.5 C)   98.8 F (37.1 C)  TempSrc: Oral   Oral  SpO2: 97% 97% 96% 99%  Weight:      Height:        Intake/Output Summary (Last 24 hours) at 02/17/16 1430 Last data filed at 02/17/16 1300  Gross per 24 hour  Intake             1615 ml  Output             2700 ml  Net            -1085 ml   Filed Weights   02/13/16 1133  Weight: (!) 145.1 kg (319 lb 14.2 oz)    Examination:  General exam: sleeping comfortable,  tracheostomy tube placement.  Respiratory system: diminished at bases, scattered rhonchi at bases. Coarse breath sounds bilateral anteriorly.  Cardiovascular system: S1 & S2 heard, RRR. No JVD, murmurs, rubs, gallops or clicks. Gastrointestinal system: Abdomen is nondistended, soft and nontender. No organomegaly or masses felt. Normal bowel sounds heard. Central nervous system:  Sleepy. Arousable.       Data Reviewed: I have personally reviewed following labs and imaging studies  CBC:  Recent Labs Lab 02/16/16 1232  WBC 14.5*  HGB 10.8*  HCT 34.5*  MCV 89.6  PLT 309   Basic Metabolic Panel:  Recent Labs Lab 02/16/16 1232  NA 140  K 3.8  CL 105  CO2 27  GLUCOSE 136*  BUN 8  CREATININE 1.03*  CALCIUM 9.0   GFR: Estimated Creatinine Clearance: 93.6 mL/min (by C-G formula based on SCr of 1.03 mg/dL). Liver Function Tests:  Recent Labs Lab 02/16/16 1232  AST 27  ALT 31  ALKPHOS 83  BILITOT 0.7  PROT 6.6  ALBUMIN 3.0*    Recent Labs Lab 02/16/16 1232  LIPASE 19  AMYLASE 40   No results for input(s): AMMONIA in the  last 168 hours. Coagulation Profile: No results for input(s): INR, PROTIME in the last 168 hours. Cardiac Enzymes: No results for input(s): CKTOTAL, CKMB, CKMBINDEX, TROPONINI in the last 168 hours. BNP (last 3 results) No results for input(s): PROBNP in the last 8760 hours. HbA1C: No results for input(s): HGBA1C in the last 72 hours. CBG:  Recent Labs Lab 02/13/16 1141 02/13/16 1445  GLUCAP 140* 133*   Lipid Profile: No results for input(s): CHOL, HDL, LDLCALC, TRIG, CHOLHDL, LDLDIRECT in the last 72 hours. Thyroid Function Tests: No results for input(s): TSH, T4TOTAL, FREET4, T3FREE, THYROIDAB in the last 72 hours. Anemia Panel: No results for input(s): VITAMINB12, FOLATE, FERRITIN, TIBC, IRON, RETICCTPCT in the last 72 hours. Sepsis Labs: No results for input(s): PROCALCITON, LATICACIDVEN in the last 168 hours.  Recent Results  (from the past 240 hour(s))  MRSA PCR Screening     Status: None   Collection Time: 02/13/16  4:40 PM  Result Value Ref Range Status   MRSA by PCR NEGATIVE NEGATIVE Final    Comment:        The GeneXpert MRSA Assay (FDA approved for NASAL specimens only), is one component of a comprehensive MRSA colonization surveillance program. It is not intended to diagnose MRSA infection nor to guide or monitor treatment for MRSA infections.          Radiology Studies: Dg Chest Port 1 View  Result Date: 02/16/2016 CLINICAL DATA:  Cough and fever EXAM: PORTABLE CHEST 1 VIEW COMPARISON:  01/25/2011 FINDINGS: Cardiac shadow is enlarged. Tracheostomy tube is now seen in satisfactory position. Minimal atelectasis/ scarring is noted in the bases bilaterally. No focal confluent infiltrate is seen. No bony abnormality is noted. IMPRESSION: Mild bibasilar changes as described. Electronically Signed   By: Alcide Clever M.D.   On: 02/16/2016 12:38        Scheduled Meds: . amLODipine  10 mg Oral q morning - 10a  . antiseptic oral rinse  7 mL Mouth Rinse q12n4p  . atenolol  50 mg Oral Daily  . bacitracin  1 application Topical BID  . chlorhexidine  15 mL Mouth Rinse BID  . ferrous sulfate  325 mg Oral Daily  . gabapentin  300 mg Oral QHS  . hydrochlorothiazide  25 mg Oral Daily  . lidocaine  1 patch Transdermal Q24H  . pantoprazole  40 mg Oral Daily  . pregabalin  50 mg Oral Daily  . saccharomyces boulardii  250 mg Oral BID  . traZODone  300 mg Oral Daily   Continuous Infusions: . dextrose 5 % and 0.45 % NaCl with KCl 20 mEq/L 125 mL/hr at 02/16/16 1932     LOS: 4 days    Time spent: 25 minutes    Simonne Boulos, MD [facility] Pager [phone]  If 7PM-7AM, please contact night-coverage [email or web address] Password [email or web address] 02/17/2016, 2:30 PM

## 2016-02-17 NOTE — Progress Notes (Signed)
4 Days Post-Op  Subjective: Epigastric pain a bit better.  Doing some swallowing.  Ambulated yesterday.  Objective: Vital signs in last 24 hours: Temp:  [97.7 F (36.5 C)-99 F (37.2 C)] 97.7 F (36.5 C) (08/08 0536) Pulse Rate:  [78-95] 78 (08/08 0844) Resp:  [16-18] 18 (08/08 0844) BP: (114-118)/(56-84) 116/84 (08/08 0536) SpO2:  [94 %-100 %] 97 % (08/08 0844) FiO2 (%):  [28 %] 28 % (08/08 0844) Last BM Date: 02/12/16  Intake/Output from previous day: 08/07 0701 - 08/08 0700 In: 1675 [P.O.:300; I.V.:1375] Out: 2200 [Urine:2200] Intake/Output this shift: No intake/output data recorded.  General appearance: alert, cooperative and no distress Neck: #6 cuffed Shiley changed to #4 cuffless Shiley without difficulty, stay sutures removed, trach wound with yellow drainage and some odor  Lab Results:   Recent Labs  02/16/16 1232  WBC 14.5*  HGB 10.8*  HCT 34.5*  PLT 309   BMET  Recent Labs  02/16/16 1232  NA 140  K 3.8  CL 105  CO2 27  GLUCOSE 136*  BUN 8  CREATININE 1.03*  CALCIUM 9.0   PT/INR No results for input(s): LABPROT, INR in the last 72 hours. ABG No results for input(s): PHART, HCO3 in the last 72 hours.  Invalid input(s): PCO2, PO2  Studies/Results: Dg Chest Port 1 View  Result Date: 02/16/2016 CLINICAL DATA:  Cough and fever EXAM: PORTABLE CHEST 1 VIEW COMPARISON:  01/25/2011 FINDINGS: Cardiac shadow is enlarged. Tracheostomy tube is now seen in satisfactory position. Minimal atelectasis/ scarring is noted in the bases bilaterally. No focal confluent infiltrate is seen. No bony abnormality is noted. IMPRESSION: Mild bibasilar changes as described. Electronically Signed   By: Alcide CleverMark  Lukens M.D.   On: 02/16/2016 12:38    Anti-infectives: Anti-infectives    None      Assessment/Plan: s/p Procedure(s): TRACHEOSTOMY (N/A) Ambulate more today.  Continue po intake and advancement.  #4 cuffless trach now in place, continue Passy-Muir valve  efforts.  Appreciate hospitalist input.  Discharge planning remains unclear.  LOS: 4 days    Alessio Bogan 02/17/2016

## 2016-02-17 NOTE — Progress Notes (Signed)
Speech Language Pathology Treatment: [name] Speaking valve  Patient Details Name: Yesenia [name] Braaten MRN: [id number] DOB: 09-11-1960 Today's Date: 02/17/2016 Time: [id number] SLP Time Calculation (min) (ACUTE ONLY): 32 min  Assessment / Plan / Recommendation Clinical Impression  Pt demonstrates significant improvement in tolerance of PMSV after trach change to 4 cuffless today. SLP assisted pt to chair for increased breath support and cleaned standing foul smelling secretions from entry of trach and chest. Pt has been expectorating independently, though quantity and viscosity are still moderate. Pt tolerated PMSV placement for 5 and then 25 minute intervals with O2 sats remaining in the mid to low 90s (no change from baseline) with no )2 trapping and no complaint from pt. Despite tolerance pt did need verbal cues for improved breath support for audible phonation at the word and phrase level. Pt was carrying this over by the end of session when communicating with RT and RN. SLP educated pt to primary precaution of remove for sleep; pt return demonstrated removal and placement of valve. Recommend pt behind wearing PMSV during all waking hours with intermittent supervision. Discussed with RN and posted visual reminders in the room. Will follow for tolerance.    HPI HPI: Yesenia BridegroomSonja Chavisis an 55 y.o.femalewith multiple medical problems not limited to CKD, DM2, HTN, severe OSA, fibromyalgia and obesity. Patient has severe OSA. She could not tolerate cpap. S/p tracheostomy 8/5.        SLP Plan  Continue with current plan of care     Recommendations         Patient may use Passy-Muir Speech Valve: During PO intake/meals;During all waking hours (remove during sleep);During all therapies with supervision PMSV Supervision: Intermittent      Oral Care Recommendations: Oral care BID Plan: Continue with current plan of care     GO                Karizma Cheek, Riley NearingBonnie Caroline 02/17/2016, 11:55 AM

## 2016-02-17 NOTE — Clinical Social Work Note (Signed)
Clinical Social Work Assessment  Patient Details  Name: Yesenia [name] MRN: [id number] Date of Birth: 1960-08-18  Date of referral:  02/17/16               Reason for consult:  Facility Placement                Permission sought to share information with:  Family Supports Permission granted to share information::  Yes, Verbal Permission Granted  Name::     Yesenia [name]  Relationship::  Daughter  Contact Information:  [phone]  Housing/Transportation Living arrangements for the past 2 months:  [place] of Information:  Patient Patient Interpreter Needed:  None Criminal Activity/Legal Involvement Pertinent to Current Situation/Hospitalization:  No - Comment as needed Significant Relationships:  Adult Children Lives with:  Self Do you feel safe going back to the place where you live?  No Need for family participation in patient care:  Yes (Comment)  Care giving concerns:  No family/friends at bedside.  Patient states that she will update family and friends of discharge plans.   Social Worker assessment / plan:  Holiday representative met with patient at bedside to offer support and discuss patient needs at discharge.  Patient states that she lives at home alone and her daughter is available to assist intermittently but not enough support to maintain a safe home environment alone.  Patient is agreeable to SNF placement in [facility].  Patient has been to [facility] in the past and would like to return if able.  CSW contacted facility liaison who inquires about suctioning needs prior to accepting patient for placement.  CSW to notify facility of suctioning needs and follow up with patient regarding available bed offers.  CSW remains available for support and to facilitate patient discharge needs once medically stable.  Employment status:  Disabled (Comment on whether or not currently receiving Disability) Insurance information:  Medicaid [place] PT Recommendations:   [place] / Referral to community resources:  [place]  Patient/Family's Response to care:  Patient verbalized understanding of CSW role and appreciation for support and concern.  Patient agreeable to SNF placement and hopeful for [facility].  Patient/Family's Understanding of and Emotional Response to Diagnosis, Current Treatment, and Prognosis:  Patient does express limited knowledge about barriers for return home, however realistic that she will need short term bridge between hospital and home.  Patient managing trach well and is aware of medical complications that may occur.  Emotional Assessment Appearance:  Appears older than stated age Attitude/Demeanor/Rapport:  Guarded Affect (typically observed):  Accepting, Flat, Guarded Orientation:  Oriented to Self, Oriented to Place, Oriented to  Time, Oriented to Situation Alcohol / Substance use:  Not Applicable Psych involvement (Current and /or in the community):  No (Comment)  Discharge Needs  Concerns to be addressed:  Discharge Planning Concerns Readmission within the last 30 days:  No Current discharge risk:  Lack of support system Barriers to Discharge:  Continued Medical Work up   [employer], [place]

## 2016-02-17 NOTE — Progress Notes (Signed)
Trach education started.  Patient was given education booklet, and was shown how to clean her trach.  She stated understanding of the importance of her learning to do this.  We will continue to provide education until discharge.

## 2016-02-17 NOTE — Progress Notes (Signed)
Patient found asleep with Passy-muir value on. Woke patient up and had patient remove value. Encouraged her to remove before she takes a nap.

## 2016-02-18 ENCOUNTER — Inpatient Hospital Stay (HOSPITAL_COMMUNITY): Payer: Medicaid Other

## 2016-02-18 DIAGNOSIS — R1013 Epigastric pain: Secondary | ICD-10-CM

## 2016-02-18 LAB — GLUCOSE, CAPILLARY
GLUCOSE-CAPILLARY: 120 mg/dL — AB (ref 65–99)
GLUCOSE-CAPILLARY: 133 mg/dL — AB (ref 65–99)
GLUCOSE-CAPILLARY: 129 mg/dL — AB (ref 65–99)
GLUCOSE-CAPILLARY: 97 mg/dL (ref 65–99)
Glucose-Capillary: 131 mg/dL — ABNORMAL HIGH (ref 65–99)

## 2016-02-18 MED ORDER — POLYVINYL ALCOHOL 1.4 % OP SOLN
1.0000 [drp] | OPHTHALMIC | Status: DC | PRN
  Filled 2016-02-18: qty 15

## 2016-02-18 MED ORDER — BISACODYL 10 MG RE SUPP
10.0000 mg | Freq: Once | RECTAL | Status: AC
  Administered 2016-02-18: 10 mg via RECTAL
  Filled 2016-02-18: qty 1

## 2016-02-18 MED ORDER — SALINE SPRAY 0.65 % NA SOLN
1.0000 | NASAL | Status: DC | PRN
  Filled 2016-02-18: qty 44

## 2016-02-18 MED ORDER — GUAIFENESIN-DM 100-10 MG/5ML PO SYRP
5.0000 mL | ORAL_SOLUTION | ORAL | Status: DC | PRN
  Administered 2016-02-18 – 2016-02-20 (×5): 5 mL via ORAL
  Filled 2016-02-18 (×5): qty 5

## 2016-02-18 MED ORDER — BLISTEX MEDICATED EX OINT
1.0000 "application " | TOPICAL_OINTMENT | CUTANEOUS | Status: DC | PRN
  Filled 2016-02-18: qty 6.3

## 2016-02-18 NOTE — Progress Notes (Signed)
Patient ID: Yesenia [name], female   DOB: 07-04-1961, 55 y.o.   MRN: [id number] Subjective: Complains that the trach "feels tight" She is able to talk with P/M valve in place.  Objective: Vital signs in last 24 hours: Temp:  [98.1 F (36.7 C)-98.8 F (37.1 C)] 98.1 F (36.7 C) (08/09 0534) Pulse Rate:  [64-77] 75 (08/09 1246) Resp:  [16-20] 18 (08/09 1246) BP: (91-114)/(53-81) 114/72 (08/09 0534) SpO2:  [94 %-100 %] 97 % (08/09 1246) FiO2 (%):  [28 %] 28 % (08/09 1246) Weight change:  Last BM Date: 02/12/16  Intake/Output from previous day: 08/08 0701 - 08/09 0700 In: 2499.2 [P.O.:120; I.V.:2379.2] Out: 3800 [Urine:3800] Intake/Output this shift: Total I/O In: -  Out: 1250 [Urine:1250]  PHYSICAL EXAM: Trach in place, straps were loose, I snugged them up. Breathing well.   Lab Results:  Recent Labs  02/16/16 1232  WBC 14.5*  HGB 10.8*  HCT 34.5*  PLT 309   BMET  Recent Labs  02/16/16 1232  NA 140  K 3.8  CL 105  CO2 27  GLUCOSE 136*  BUN 8  CREATININE 1.03*  CALCIUM 9.0    Studies/Results: [place] Abdomen Complete  Addendum Date: 02/18/2016   ADDENDUM REPORT: 02/18/2016 10:50 ADDENDUM: As mentioned in main body of report, but not in Impression, fatty infiltration of the liver is noted. Electronically Signed   By: Lupita RaiderJames  Green Jr, M.D.   On: 02/18/2016 10:50   Result Date: 02/18/2016 CLINICAL DATA:  Acute epigastric abdominal pain. EXAM: ABDOMEN ULTRASOUND COMPLETE COMPARISON:  None. FINDINGS: Gallbladder: No gallstones or wall thickening visualized. No sonographic Murphy sign noted by sonographer. Common bile duct: Diameter: 3.4 mm which is within normal limits. Liver: No focal lesion identified. Increased echogenicity is noted suggesting fatty infiltration. IVC: No abnormality visualized. Pancreas: Visualized portion unremarkable. Tail not visualized due to overlying bowel gas. Spleen: Size and appearance within normal limits. Right Kidney: Length: 11.4 cm. 4 cm  simple cyst seen in upper pole. Echogenicity within normal limits. No mass or hydronephrosis visualized. Left Kidney: Length: 10.7 cm. 1.8 cm simple cyst is noted. Echogenicity within normal limits. No mass or hydronephrosis visualized. Abdominal aorta: No aneurysm visualized. Other findings: None. IMPRESSION: Bilateral simple renal cysts. No other abnormality seen in the abdomen. Electronically Signed: By: Lupita RaiderJames  Green Jr, M.D. On: 02/18/2016 10:44    Medications: I have reviewed the patient's current medications.  Assessment/Plan: Stable trach, continue care.   LOS: 5 days   Yesenia [name] 02/18/2016, 1:04 PM

## 2016-02-18 NOTE — Progress Notes (Signed)
PROGRESS NOTE    Yesenia [name]  ZOX:[id number] DOB: [birth year] DOA: 02/13/2016 PCP: Egbert Garibaldi, NP    Brief Narrative: This is a 55 year old female who is postop day 2 from tracheostomy placement, in addition to chronic kidney disease, diabetes type 2, hypertension, obesity, fibromyalgia reports 2 days of abdominal pain in the epigastric area which were not improved with Protonix and Maalox, referred to medical service for evaluation of abdominal pain.   Assessment & Plan:   Active Problems:   Diabetes mellitus, type 2 (HCC)   Sleep apnea   Upper abdominal pain   Constipation   CKD (chronic kidney disease) stage 3, GFR 30-59 ml/min   Fever   Cough   Obstructive sleep apnea: S/p tracheostomy by ENT.  Further evaluation by primary.    Diabetes Mellitus: - CBG 120-133 for past 24 hours - patient still receiving dextrose 5% and 0.45% NaCl with KCl - will continue to trend CBD and continue SSI    Upper abdominal pain:  - Epigastric pain slightly improved today.  - Lipase and amylase normal.  - rest of CMP within normal limits.  - US abdomen showed no acute abnormality - patient voices that she only has minimal pain when she sits up  Constipation - last BM was almost 1 week ago -  Will order ducolax suppository    Chronic Normocytic Anemia - appears stable - Iron panel from 12/26 WNL   Stage 3 CKD - Appears at baseline - GFR >60, Cr 1.03     DVT prophylaxis: lovenox Code Status: Full Family Communication: no family present. Discussed with patient Disposition Plan: defer to primary team    Procedures: tracheostomy (02/14/16)  Antimicrobials: None    Subjective: Patient is awake watching television.  She can speak but takes great efforts to get a few words out.  Does mention that she has not had a bowel movement since Thursday (6 days ago).  Mentions she is not eating so that could explain her lack of bowel movement.  Patient voices  no problems with breathing, chest pain or ambulating.    Objective: Vitals:   02/18/16 0035 02/18/16 0335 02/18/16 0534 02/18/16 0802  BP:   114/72   Pulse: 77 77 71 67  Resp: 20 20 18 18   Temp:   98.1 F (36.7 C)   TempSrc:   Oral   SpO2: 95% 95% 97% 98%  Weight:      Height:        Intake/Output Summary (Last 24 hours) at 02/18/16 1111 Last data filed at 02/18/16 0904  Gross per 24 hour  Intake          2499.17 ml  Output             4250 ml  Net         -1750.83 ml   Filed Weights   02/13/16 1133  Weight: (!) 145.1 kg (319 lb 14.2 oz)    Examination:  General exam: Appears calm and comfortable  Respiratory system: Upper airway noises noted.  Otherwise mostly clear to auscultation with minimal rhonchi. Respiratory effort normal.   Cardiovascular system: S1 & S2 heard, RRR. No JVD, murmurs, rubs, gallops or clicks. No pedal edema. Gastrointestinal system: Abdomen is obese, nondistended, soft and nontender. No organomegaly or masses felt. Normal bowel sounds heard.  Patient did voice pain with sitting up for lung exam. Central nervous system: Alert and oriented. No focal neurological deficits. Extremities: Symmetric 5 x 5 power. Skin:  No rashes, lesions or ulcers Psychiatry: Judgement and insight appear normal. Mood & affect appropriate.     Data Reviewed: I have personally reviewed following labs and imaging studies  CBC:  Recent Labs Lab 02/16/16 1232  WBC 14.5*  HGB 10.8*  HCT 34.5*  MCV 89.6  PLT 309   Basic Metabolic Panel:  Recent Labs Lab 02/16/16 1232  NA 140  K 3.8  CL 105  CO2 27  GLUCOSE 136*  BUN 8  CREATININE 1.03*  CALCIUM 9.0   GFR: Estimated Creatinine Clearance: 93.6 mL/min (by C-G formula based on SCr of 1.03 mg/dL). Liver Function Tests:  Recent Labs Lab 02/16/16 1232  AST 27  ALT 31  ALKPHOS 83  BILITOT 0.7  PROT 6.6  ALBUMIN 3.0*    Recent Labs Lab 02/16/16 1232  LIPASE 19  AMYLASE 40   No results for  input(s): AMMONIA in the last 168 hours. Coagulation Profile: No results for input(s): INR, PROTIME in the last 168 hours. Cardiac Enzymes: No results for input(s): CKTOTAL, CKMB, CKMBINDEX, TROPONINI in the last 168 hours. BNP (last 3 results) No results for input(s): PROBNP in the last 8760 hours. HbA1C: No results for input(s): HGBA1C in the last 72 hours. CBG:  Recent Labs Lab 02/13/16 1141 02/13/16 1445 02/17/16 1720 02/17/16 2107 02/18/16 0742  GLUCAP 140* 133* 132* 120* 133*   Lipid Profile: No results for input(s): CHOL, HDL, LDLCALC, TRIG, CHOLHDL, LDLDIRECT in the last 72 hours. Thyroid Function Tests: No results for input(s): TSH, T4TOTAL, FREET4, T3FREE, THYROIDAB in the last 72 hours. Anemia Panel: No results for input(s): VITAMINB12, FOLATE, FERRITIN, TIBC, IRON, RETICCTPCT in the last 72 hours. Sepsis Labs: No results for input(s): PROCALCITON, LATICACIDVEN in the last 168 hours.  Recent Results (from the past 240 hour(s))  MRSA PCR Screening     Status: None   Collection Time: 02/13/16  4:40 PM  Result Value Ref Range Status   MRSA by PCR NEGATIVE NEGATIVE Final    Comment:        The GeneXpert MRSA Assay (FDA approved for NASAL specimens only), is one component of a comprehensive MRSA colonization surveillance program. It is not intended to diagnose MRSA infection nor to guide or monitor treatment for MRSA infections.          Radiology Studies: US Abdomen Complete  Addendum Date: 02/18/2016   ADDENDUM REPORT: 02/18/2016 10:50 ADDENDUM: As mentioned in main body of report, but not in Impression, fatty infiltration of the liver is noted. Electronically Signed   By: Lupita Raider, M.D.   On: 02/18/2016 10:50   Result Date: 02/18/2016 CLINICAL DATA:  Acute epigastric abdominal pain. EXAM: ABDOMEN ULTRASOUND COMPLETE COMPARISON:  None. FINDINGS: Gallbladder: No gallstones or wall thickening visualized. No sonographic Murphy sign noted by  sonographer. Common bile duct: Diameter: 3.4 mm which is within normal limits. Liver: No focal lesion identified. Increased echogenicity is noted suggesting fatty infiltration. IVC: No abnormality visualized. Pancreas: Visualized portion unremarkable. Tail not visualized due to overlying bowel gas. Spleen: Size and appearance within normal limits. Right Kidney: Length: 11.4 cm. 4 cm simple cyst seen in upper pole. Echogenicity within normal limits. No mass or hydronephrosis visualized. Left Kidney: Length: 10.7 cm. 1.8 cm simple cyst is noted. Echogenicity within normal limits. No mass or hydronephrosis visualized. Abdominal aorta: No aneurysm visualized. Other findings: None. IMPRESSION: Bilateral simple renal cysts. No other abnormality seen in the abdomen. Electronically Signed: By: Lupita Raider, M.D. On: 02/18/2016 10:44  Dg Chest Port 1 View  Result Date: 02/16/2016 CLINICAL DATA:  Cough and fever EXAM: PORTABLE CHEST 1 VIEW COMPARISON:  01/25/2011 FINDINGS: Cardiac shadow is enlarged. Tracheostomy tube is now seen in satisfactory position. Minimal atelectasis/ scarring is noted in the bases bilaterally. No focal confluent infiltrate is seen. No bony abnormality is noted. IMPRESSION: Mild bibasilar changes as described. Electronically Signed   By: Alcide CleverMark  Lukens M.D.   On: 02/16/2016 12:38        Scheduled Meds: . amLODipine  10 mg Oral q morning - 10a  . antiseptic oral rinse  7 mL Mouth Rinse q12n4p  . atenolol  50 mg Oral Daily  . bacitracin  1 application Topical BID  . chlorhexidine  15 mL Mouth Rinse BID  . ferrous sulfate  325 mg Oral Daily  . gabapentin  300 mg Oral QHS  . hydrochlorothiazide  25 mg Oral Daily  . insulin aspart  0-9 Units Subcutaneous TID WC  . lidocaine  1 patch Transdermal Q24H  . pantoprazole  40 mg Oral Daily  . pregabalin  50 mg Oral Daily  . saccharomyces boulardii  250 mg Oral BID  . traZODone  300 mg Oral Daily   Continuous Infusions: . dextrose 5 %  and 0.45 % NaCl with KCl 20 mEq/L 125 mL/hr at 02/17/16 2130     LOS: 5 days    Time spent: 30 minutes    Bennett ScrapeAlex Ukleja, MD [facility] Pager [phone]  If 7PM-7AM, please contact night-coverage [email or web address] Password [email or web address] 02/18/2016, 11:11 AM

## 2016-02-18 NOTE — Clinical Social Work Note (Signed)
Clinical Social Worker continuing to follow patient for support and discharge planning needs.  Patient has been accepted to [facility] once suctioning has been limited to every 4-5 hours.  Patient sleeping upon CSW arrival, so relayed message to RN about bed offer.  CSW to follow up again tomorrow with patient to verify bed offer.  Patient has been to the facility in the past and requested return.  CSW remains available for support and to facilitate patient discharge needs once medically stable.  Yesenia [name], Kentucky[place]CS[place] [phone]

## 2016-02-18 NOTE — Progress Notes (Signed)
Patient did not have bowel movement after the suppository was administered. Ilean SkillVeronica Tuan Tippin LPN

## 2016-02-18 NOTE — Plan of Care (Signed)
Problem: Bowel/Gastric: Goal: Will not experience complications related to bowel motility Outcome: Not Progressing Pt reports last bowel movement for 02/12/16, suppository ordered and administered this afternoon

## 2016-02-18 NOTE — Progress Notes (Signed)
Speech Language Pathology Treatment: Yesenia [name] [name] Speaking valve  Patient Details Name: Yesenia [name] Yesenia [name] [name] MRN: [id number] DOB: [birth year] Today's Date: 02/18/2016 Time: [id number] SLP Time Calculation (min) (ACUTE ONLY): 20 min  Assessment / Plan / Recommendation Clinical Impression  Pt wearing PMSV during all waking hours per pt and RN. Pt able to verbalize proper use of PMSV including cleaning and only wearing when awake. Pt continues to have sensation that the trach feels tight- MD and RN are aware. Pt with voicing at the word- phrase level with decreased vocal intensity and limited respiratory support. Practiced donning and doffing the trach with video assist, however pt had difficulty grasping the trach and removing the PMSV at the same time and therefore frequently loosened the inner cannula rather than removing the PMSV. With multiple repetitions, pt eventually was able to remove and replace the PMSV, however pt will require further training/practice to complete donning/doffing independently. Requested pt call RN when she is ready to don or doff PMSV. Pt and RN verbalized understanding of recommendation. Will follow.    HPI HPI: Yesenia [name] BridegroomSonja Chavisis an 55 y.o.femalewith multiple medical problems not limited to CKD, DM2, HTN, severe OSA, fibromyalgia and obesity. Patient has severe OSA. She could not tolerate cpap. S/p tracheostomy 8/5.        SLP Plan  Continue with current plan of care     Recommendations         Patient may use Passy-[name] Speech Valve: During PO intake/meals;During all waking hours (remove during sleep);During all therapies with supervision PMSV Supervision: Intermittent (and with all donning and doffing)      Oral Care Recommendations: Oral care BID Plan: Continue with current plan of care     GO                Rocky CraftsKara E Kasy Iannacone MA, CCC-SLP 02/18/2016, 1:59 PM

## 2016-02-19 DIAGNOSIS — E1122 Type 2 diabetes mellitus with diabetic chronic kidney disease: Secondary | ICD-10-CM

## 2016-02-19 LAB — GLUCOSE, CAPILLARY
GLUCOSE-CAPILLARY: 102 mg/dL — AB (ref 65–99)
Glucose-Capillary: 142 mg/dL — ABNORMAL HIGH (ref 65–99)
Glucose-Capillary: 112 mg/dL — ABNORMAL HIGH (ref 65–99)
Glucose-Capillary: 104 mg/dL — ABNORMAL HIGH (ref 65–99)

## 2016-02-19 LAB — CBC
HCT: 31.7 % — ABNORMAL LOW (ref 36.0–46.0)
HEMOGLOBIN: 9.7 g/dL — AB (ref 12.0–15.0)
MCH: 27.4 pg (ref 26.0–34.0)
MCHC: 30.6 g/dL (ref 30.0–36.0)
MCV: 89.5 fL (ref 78.0–100.0)
PLATELETS: 331 10*3/uL (ref 150–400)
RBC: 3.54 MIL/uL — AB (ref 3.87–5.11)
RDW: 14.3 % (ref 11.5–15.5)
WBC: 9.9 10*3/uL (ref 4.0–10.5)

## 2016-02-19 MED ORDER — POLYETHYLENE GLYCOL 3350 17 G PO PACK
17.0000 g | PACK | Freq: Every day | ORAL | Status: DC
  Administered 2016-02-19: 17 g via ORAL

## 2016-02-19 MED ORDER — POLYETHYLENE GLYCOL 3350 17 G PO PACK
17.0000 g | PACK | Freq: Every day | ORAL | Status: DC | PRN
  Filled 2016-02-19: qty 1

## 2016-02-19 NOTE — Progress Notes (Signed)
RT note: Assisted patient with replacing trach dressing and applying ointment to site: Patient done very well and stated she felt more comfortable with changing the dressing.

## 2016-02-19 NOTE — Progress Notes (Signed)
6 Days Post-Op  Subjective: Complains of soreness around trach site.  Requests advancing of diet.  Swallowing liquids and pills well.  Tolerating Passy-Muir valve well.  Says she was in a chair earlier today.  Objective: Vital signs in last 24 hours: Temp:  [99 F (37.2 C)-99.4 F (37.4 C)] 99.4 F (37.4 C) (08/10 1433) Pulse Rate:  [63-82] 76 (08/10 1649) Resp:  [16-20] 16 (08/10 1649) BP: (104-122)/(52-84) 104/52 (08/10 1433) SpO2:  [96 %-100 %] 97 % (08/10 1649) FiO2 (%):  [28 %] 28 % (08/10 1649) Last BM Date: 02/18/16  Intake/Output from previous day: 08/09 0701 - 08/10 0700 In: -  Out: 2650 [Urine:2650] Intake/Output this shift: Total I/O In: 3456.3 [I.V.:3456.3] Out: -   General appearance: alert, cooperative and no distress Neck: #4 cuffless Shiley in place, trach site with redness of skin around stoma, Passy-Muir valve placement results in decent voice  Lab Results:   Recent Labs  02/19/16 0545  WBC 9.9  HGB 9.7*  HCT 31.7*  PLT 331   BMET No results for input(s): NA, K, CL, CO2, GLUCOSE, BUN, CREATININE, CALCIUM in the last 72 hours. PT/INR No results for input(s): LABPROT, INR in the last 72 hours. ABG No results for input(s): PHART, HCO3 in the last 72 hours.  Invalid input(s): PCO2, PO2  Studies/Results: [place] Abdomen Complete  Addendum Date: 02/18/2016   ADDENDUM REPORT: 02/18/2016 10:50 ADDENDUM: As mentioned in main body of report, but not in Impression, fatty infiltration of the liver is noted. Electronically Signed   By: Lupita RaiderJames  Green Jr, M.D.   On: 02/18/2016 10:50   Result Date: 02/18/2016 CLINICAL DATA:  Acute epigastric abdominal pain. EXAM: ABDOMEN ULTRASOUND COMPLETE COMPARISON:  None. FINDINGS: Gallbladder: No gallstones or wall thickening visualized. No sonographic Murphy sign noted by sonographer. Common bile duct: Diameter: 3.4 mm which is within normal limits. Liver: No focal lesion identified. Increased echogenicity is noted suggesting  fatty infiltration. IVC: No abnormality visualized. Pancreas: Visualized portion unremarkable. Tail not visualized due to overlying bowel gas. Spleen: Size and appearance within normal limits. Right Kidney: Length: 11.4 cm. 4 cm simple cyst seen in upper pole. Echogenicity within normal limits. No mass or hydronephrosis visualized. Left Kidney: Length: 10.7 cm. 1.8 cm simple cyst is noted. Echogenicity within normal limits. No mass or hydronephrosis visualized. Abdominal aorta: No aneurysm visualized. Other findings: None. IMPRESSION: Bilateral simple renal cysts. No other abnormality seen in the abdomen. Electronically Signed: By: Lupita RaiderJames  Green Jr, M.D. On: 02/18/2016 10:44    Anti-infectives: Anti-infectives    None      Assessment/Plan: s/p Procedure(s): TRACHEOSTOMY (N/A) Will advance diet.  Needs to ambulate more.  Trach site with inflammation that is typical of fresh trach, should improve with time.  Disposition appears to be to SNF, possibly tomorrow.  LOS: 6 days    Yesenia [name] 02/19/2016

## 2016-02-19 NOTE — Progress Notes (Signed)
PROGRESS NOTE    Yesenia [name]  ZOX:[id number] DOB: [birth year] DOA: 02/13/2016 PCP: Egbert Garibaldi, NP    Brief Narrative: This is a 55 year old female who is postop day 4 from tracheostomy placement, in addition to chronic kidney disease, diabetes type 2, hypertension, obesity, fibromyalgia reported2 days of abdominal pain in the epigastric area which were not improved with Protonix and Maalox, referred to medical service for evaluation of abdominal pain. Abdominal ultrasound showed kidney cysts but otherwise negative.    Assessment & Plan:   Active Problems:   Diabetes mellitus, type 2 (HCC)   Sleep apnea   Abdominal pain   Constipation   CKD (chronic kidney disease) stage 3, GFR 30-59 ml/min   Fever   Cough   Diabetes Mellitus: - CBG 120-133 for past 24 hours - patient still receiving dextrose 5% and 0.45% NaCl with KCl - will continue to trend CBD and continue SSI  - three 1 unit doses of insulin given yesterday   Upper abdominal pain:  - No pain today  - Lipase and amylase checked on 02/16/16 and were normal.  - last CMP within normal limits  - US abdomen showed no acute abnormality - Patient states she was previously told to avoid gluten due to abdominal pain  Constipation - last BM was almost 1 week ago -  Patient did have 1 BM yesterday - may need to add consistent bowel regimen if patient to continue on opiates   Chronic Normocytic Anemia - H/H slightly decreased from three days ago (likely dilutional)   Stage 3 CKD - Appears at baseline - GFR >60, Cr 1.03 on 8/7   DVT prophylaxis: Lovenox Code Status: Full Family Communication: Called and discussed with Freeman Caldron (patient's daugther) at [phone]  Disposition Plan: defer to primary team- patient to go to [facility] when suctioning limited to every 4-5 hours   Procedures:   Tracheostomy (02/14/16)  Antimicrobials:   none    Subjective: Patient feels  well this morning.  She states she slept well and appears less tired.  She is speaking in full sentences.  She did have a bowel movement after suppository administration yesterday.  She mentions this did help improve her abdominal pain significantly.  She has questions about when her diet will be advanced.  She does not have any abdominal pain currently.  Denies chest pain or chest pressure.  Asked if I would call her daughter to let her daughter know what is going on.  Patient is doing well.  Objective: Vitals:   02/18/16 2300 02/19/16 0403 02/19/16 0523 02/19/16 0801  BP:   122/84   Pulse: 82 76 72 74  Resp: 18 18 19 18   Temp:   99 F (37.2 C)   TempSrc:   Oral   SpO2: 100% 96% 100% 98%  Weight:      Height:        Intake/Output Summary (Last 24 hours) at 02/19/16 0939 Last data filed at 02/19/16 0717  Gross per 24 hour  Intake          3456.25 ml  Output             2200 ml  Net          1256.25 ml   Filed Weights   02/13/16 1133  Weight: (!) 145.1 kg (319 lb 14.2 oz)    Examination:  General exam: Appears calm and comfortable  Neck: trach site without surrounding erythema or  drainage.   Respiratory system: Clear to auscultation. Respiratory effort normal. Improved aeration from yesterday Cardiovascular system: S1 & S2 heard, RRR. No JVD, murmurs, rubs, gallops or clicks. No pedal edema. Gastrointestinal system: Abdomen is obese, soft and nontender. No organomegaly or masses felt. Normal bowel sounds heard. Central nervous system: Alert and oriented. No focal neurological deficits. Extremities: Symmetric 5 x 5 power. Skin: No rashes, lesions or ulcers Psychiatry: Judgement and insight appear normal. Mood & affect appropriate.     Data Reviewed: I have personally reviewed following labs and imaging studies  CBC:  Recent Labs Lab 02/16/16 1232 02/19/16 0545  WBC 14.5* 9.9  HGB 10.8* 9.7*  HCT 34.5* 31.7*  MCV 89.6 89.5  PLT 309 331   Basic Metabolic  Panel:  Recent Labs Lab 02/16/16 1232  NA 140  K 3.8  CL 105  CO2 27  GLUCOSE 136*  BUN 8  CREATININE 1.03*  CALCIUM 9.0   GFR: Estimated Creatinine Clearance: 93.6 mL/min (by C-G formula based on SCr of 1.03 mg/dL). Liver Function Tests:  Recent Labs Lab 02/16/16 1232  AST 27  ALT 31  ALKPHOS 83  BILITOT 0.7  PROT 6.6  ALBUMIN 3.0*    Recent Labs Lab 02/16/16 1232  LIPASE 19  AMYLASE 40   No results for input(s): AMMONIA in the last 168 hours. Coagulation Profile: No results for input(s): INR, PROTIME in the last 168 hours. Cardiac Enzymes: No results for input(s): CKTOTAL, CKMB, CKMBINDEX, TROPONINI in the last 168 hours. BNP (last 3 results) No results for input(s): PROBNP in the last 8760 hours. HbA1C: No results for input(s): HGBA1C in the last 72 hours. CBG:  Recent Labs Lab 02/18/16 0742 02/18/16 1201 02/18/16 1833 02/18/16 2137 02/19/16 0756  GLUCAP 133* 131* 129* 97 142*   Lipid Profile: No results for input(s): CHOL, HDL, LDLCALC, TRIG, CHOLHDL, LDLDIRECT in the last 72 hours. Thyroid Function Tests: No results for input(s): TSH, T4TOTAL, FREET4, T3FREE, THYROIDAB in the last 72 hours. Anemia Panel: No results for input(s): VITAMINB12, FOLATE, FERRITIN, TIBC, IRON, RETICCTPCT in the last 72 hours. Sepsis Labs: No results for input(s): PROCALCITON, LATICACIDVEN in the last 168 hours.  Recent Results (from the past 240 hour(s))  MRSA PCR Screening     Status: None   Collection Time: 02/13/16  4:40 PM  Result Value Ref Range Status   MRSA by PCR NEGATIVE NEGATIVE Final    Comment:        The GeneXpert MRSA Assay (FDA approved for NASAL specimens only), is one component of a comprehensive MRSA colonization surveillance program. It is not intended to diagnose MRSA infection nor to guide or monitor treatment for MRSA infections.          Radiology Studies: US Abdomen Complete  Addendum Date: 02/18/2016   ADDENDUM REPORT:  02/18/2016 10:50 ADDENDUM: As mentioned in main body of report, but not in Impression, fatty infiltration of the liver is noted. Electronically Signed   By: Lupita Raider, M.D.   On: 02/18/2016 10:50   Result Date: 02/18/2016 CLINICAL DATA:  Acute epigastric abdominal pain. EXAM: ABDOMEN ULTRASOUND COMPLETE COMPARISON:  None. FINDINGS: Gallbladder: No gallstones or wall thickening visualized. No sonographic Murphy sign noted by sonographer. Common bile duct: Diameter: 3.4 mm which is within normal limits. Liver: No focal lesion identified. Increased echogenicity is noted suggesting fatty infiltration. IVC: No abnormality visualized. Pancreas: Visualized portion unremarkable. Tail not visualized due to overlying bowel gas. Spleen: Size and appearance within normal limits.  Right Kidney: Length: 11.4 cm. 4 cm simple cyst seen in upper pole. Echogenicity within normal limits. No mass or hydronephrosis visualized. Left Kidney: Length: 10.7 cm. 1.8 cm simple cyst is noted. Echogenicity within normal limits. No mass or hydronephrosis visualized. Abdominal aorta: No aneurysm visualized. Other findings: None. IMPRESSION: Bilateral simple renal cysts. No other abnormality seen in the abdomen. Electronically Signed: By: Lupita RaiderJames  Green Jr, M.D. On: 02/18/2016 10:44        Scheduled Meds: . amLODipine  10 mg Oral q morning - 10a  . antiseptic oral rinse  7 mL Mouth Rinse q12n4p  . atenolol  50 mg Oral Daily  . bacitracin  1 application Topical BID  . chlorhexidine  15 mL Mouth Rinse BID  . ferrous sulfate  325 mg Oral Daily  . gabapentin  300 mg Oral QHS  . hydrochlorothiazide  25 mg Oral Daily  . insulin aspart  0-9 Units Subcutaneous TID WC  . lidocaine  1 patch Transdermal Q24H  . pantoprazole  40 mg Oral Daily  . pregabalin  50 mg Oral Daily  . saccharomyces boulardii  250 mg Oral BID  . traZODone  300 mg Oral Daily   Continuous Infusions: . dextrose 5 % and 0.45 % NaCl with KCl 20 mEq/L 125 mL/hr  (02/19/16 0717)     LOS: 6 days    Time spent: 25 minutes    Bennett ScrapeAlex Odin Mariani, MD [facility] Pager [phone]  If 7PM-7AM, please contact night-coverage [email or web address] Password [email or web address] 02/19/2016, 9:39 AM

## 2016-02-19 NOTE — Progress Notes (Signed)
Patient seen for trach team follow up.  Patient was given a mirror to assist her when cleaning trach and placing and removing PMV.  Followed up with patient regarding education given prior.  Patient has not actively attempted to clean her trach, or perform suction.  She did not feel it necessary since she was going to facility upon discharge.  I re-iterated the importance of her learning the procedures.  Will continue to follow.

## 2016-02-20 LAB — GLUCOSE, CAPILLARY
Glucose-Capillary: 131 mg/dL — ABNORMAL HIGH (ref 65–99)
Glucose-Capillary: 128 mg/dL — ABNORMAL HIGH (ref 65–99)

## 2016-02-20 MED ORDER — CEPHALEXIN 500 MG PO CAPS: 500.0000 mg | ORAL_CAPSULE | Freq: Three times a day (TID) | ORAL | 0 refills | Status: DC

## 2016-02-20 MED ORDER — CEPHALEXIN 500 MG PO CAPS
500.0000 mg | ORAL_CAPSULE | Freq: Three times a day (TID) | ORAL | Status: DC
  Administered 2016-02-20: 500 mg via ORAL
  Filled 2016-02-20: qty 1

## 2016-02-20 MED ORDER — BACITRACIN ZINC 500 UNIT/GM EX OINT: 1.0000 "application " | TOPICAL_OINTMENT | Freq: Two times a day (BID) | CUTANEOUS | 0 refills | Status: DC

## 2016-02-20 MED ORDER — PANTOPRAZOLE SODIUM 40 MG PO TBEC: 40.0000 mg | DELAYED_RELEASE_TABLET | Freq: Every day | ORAL | 0 refills | Status: DC

## 2016-02-20 NOTE — Progress Notes (Signed)
RT note: Trach education with cleaning inner canula. Assisted with and used teach back method with patient. Patient has made good effort on how to clean and care for her stoma and trach. Her skills have gotten  much better.

## 2016-02-20 NOTE — Clinical Social Work Placement (Signed)
   CLINICAL SOCIAL WORK PLACEMENT  NOTE  Date:  02/20/2016  Patient Details  Name: Yesenia [name] MRN: [id number] Date of Birth: Sep 13, 1960  Clinical Social Work is seeking post-discharge placement for this patient at the Skilled  Nursing Facility level of care (*CSW will initial, date and re-position this form in  chart as items are completed):  Yes   Patient/family provided with [facility] Clinical Social Work Department's list of facilities offering this level of care within the geographic area requested by the patient (or if unable, by the patient's family).  Yes   Patient/family informed of their freedom to choose among providers that offer the needed level of care, that participate in Medicare, Medicaid or managed care program needed by the patient, have an available bed and are willing to accept the patient.  Yes   Patient/family informed of [facility] ownership interest in [facility] and [facility], as well as of the fact that they are under no obligation to receive care at these facilities.  PASRR submitted to EDS on       PASRR number received on       Existing PASRR number confirmed on 02/17/16     FL2 transmitted to all facilities in geographic area requested by pt/family on 02/17/16     FL2 transmitted to all facilities within larger geographic area on       Patient informed that his/her managed care company has contracts with or will negotiate with certain facilities, including the following:        Yes   Patient/family informed of bed offers received.  [facility]     Physician recommends and patient chooses bed at      [facility] 02/20/16.  Patient to be transferred to facility by Ambulance     Patient family notified on 02/20/16 of transfer.  Name of family member notified:  Patient to notify family     PHYSICIAN       Additional Comment:   Macario GoldsJesse  Donnita Farina, LCSW [phone]

## 2016-02-20 NOTE — Discharge Summary (Signed)
Physician Discharge Summary  Patient ID: Yesenia [name] MRN: [id number] DOB/AGE: [birth year] 55 y.o.  Admit date: 02/13/2016 Discharge date: 02/20/2016  Admission Diagnoses: Severe obstructive sleep apnea  Discharge Diagnoses:  Active Problems:   Diabetes mellitus, type 2 (HCC)   Sleep apnea   Abdominal pain   Constipation   CKD (chronic kidney disease) stage 3, GFR 30-59 ml/min   Fever   Cough   Discharged Condition: good  Hospital Course: 55 year old female with severe sleep apnea unable to tolerate CPAP presented for elective tracheostomy.  See operative note.  She was observed in stepdown unit after surgery and did well.  On POD 1, she was transferred to a regular room where she remained through the remainder of her hospitalization.  She complained of epigastric pain early in her course and the hospitalist evaluated her and felt that pain was either musculoskeletal or perhaps diet related.  Pain resolved after a couple of days.  Trach care has been performed through her hospitalization.  On POD 4, she was changed to a cuffless 4 Shiley tube which remains in place.  She has been tolerating a Passy-Muir valve for speech.  She has not progressed very well with tracheostomy care and a nursing facility was felt to be needed.  She has no one to stay with her at home.  Toward the end of her hospitalization, the trach site has had some odor, soreness, and skin redness.  This is typical of a fresh trach with an obese neck but cephalexin was added on her day of discharge to continue for one week.  Consults: Hospitalist  Significant Diagnostic Studies: none  Treatments: surgery: tracheostomy  Discharge Exam: Blood pressure (!) 102/49, pulse 74, temperature (!) 100.4 F (38 C), temperature source Oral, resp. rate 16, height 5\' 7"  (1.702 m), weight (!) 145.1 kg (319 lb 14.2 oz), SpO2 95 %. General appearance: alert, cooperative and no distress Neck: #4 cuffless Shiley in place, skin around  tracheostomy a bit red, wound gaping somewhat inferiorly, some odor  Disposition: 01-Home or Self Care  Discharge Instructions    Diet - low sodium heart healthy    Complete by:  As directed   Discharge instructions    Complete by:  As directed   Clean trach site twice daily with half strength peroxide and add Bacitracin ointment.  Trach care per routine.  Trach teaching.  Wear Passy-Muir valve when awake to talk.  Do not wear Passy-Muir valve when sleeping.   Increase activity slowly    Complete by:  As directed       Medication List    TAKE these medications   albuterol 108 (90 Base) MCG/ACT inhaler Commonly known as:  PROVENTIL HFA;VENTOLIN HFA Inhale 2 puffs into the lungs every 6 (six) hours as needed for wheezing.   amLODipine 10 MG tablet Commonly known as:  NORVASC Take 10 mg by mouth every morning.   atenolol 50 MG tablet Commonly known as:  TENORMIN Take 50 mg by mouth daily.   bacitracin ointment Apply 1 application topically 2 (two) times daily. To trach site   cephALEXin 500 MG capsule Commonly known as:  KEFLEX Take 1 capsule (500 mg total) by mouth every 8 (eight) hours.   ferrous sulfate 325 (65 FE) MG tablet Take 325 mg by mouth daily.   gabapentin 300 MG capsule Commonly known as:  NEURONTIN Take 300 mg by mouth at bedtime.   hydrochlorothiazide 25 MG tablet Commonly known as:  HYDRODIURIL Take 25  mg by mouth daily.   ondansetron 4 MG tablet Commonly known as:  ZOFRAN Take 4 mg by mouth every 6 (six) hours as needed for nausea or vomiting.   pantoprazole 40 MG tablet Commonly known as:  PROTONIX Take 1 tablet (40 mg total) by mouth daily.   pregabalin 50 MG capsule Commonly known as:  LYRICA Take 50 mg by mouth daily.   saccharomyces boulardii 250 MG capsule Commonly known as:  FLORASTOR Take 250 mg by mouth 2 (two) times daily.   traZODone 150 MG tablet Commonly known as:  DESYREL Take 300 mg by mouth daily.         SignedJenne [name], Yesenia [name] 02/20/2016, 10:19 AM

## 2016-02-20 NOTE — Progress Notes (Addendum)
DC'd iv.  Cleaned around trach and applied new dry dssg.  Pt denies pain.  No s/s of any acute distress.  Dressed in paper [employer] gown.  Extra trach supplies packed up with pt.  Pt awaiting transport to facility.   [facility] here for transport at 1550. [facility]

## 2016-02-20 NOTE — Progress Notes (Signed)
Report given to Gari CrownKathy Gaines, RN at [facility].  Facility awaiting arrival of trach supplies for pt.  Social worker to set up transport around 3pm unless something changes.  Pt has no c/o pain or s/s of any acute distress.

## 2016-02-20 NOTE — Progress Notes (Signed)
PROGRESS NOTE    Yesenia StageSonja Groome  ZOX:[id number] DOB: [birth year] DOA: 02/13/2016 PCP: Egbert GaribaldiMillsaps, KIMBERLY M, NP    Brief Narrative:  This is a 55 year old female who is postop day 4 from tracheostomy placement, in addition to chronic kidney disease, diabetes type 2, hypertension, obesity, fibromyalgia reported2 days of abdominal pain in the epigastric area which were not improved with Protonix and Maalox, referred to medical service for evaluation of abdominal pain. Abdominal ultrasound showed kidney cysts but otherwise negative.  Patient found to be constipated and given a suppository.  Suppository relieved constipation and patient had numerous bowel movements.   Assessment & Plan:   Active Problems:   Diabetes mellitus, type 2 (HCC)   Sleep apnea   Abdominal pain   Constipation   CKD (chronic kidney disease) [name] 3, GFR 30-59 ml/min   Fever   Cough   Diabetes Mellitus: - CBG well controlled for past >24 hours - patient still receiving dextrose 5% and 0.45% NaCl with 20mEq KCl - patient unlikely to need insulin at time of discharge   Upper abdominal pain:  - No pain today  - Lipase and amylase checked on 02/16/16 and werenormal.  - last CMP within normal limits  - US abdomen showed no acute abnormality - discussed in length with patient discussing irritable bowel syndrome with her PCP  Constipation - resolved  Chronic Normocytic Anemia - stable  [name] 3 CKD - Appears at baseline - GFR >60, Cr 1.03 on 8/7   DVT prophylaxis: lovenox Code Status: full Family Communication: patient preparing for discharge, I spoke on the patients phone with her friend Amuth Disposition Plan: [facility]   Procedures:   Tracheostomy 02/14/16  Antimicrobials:  none    Subjective: Patient in good spirits since her diet was advanced.  She states she has eaten well and denies having any increased cough.  No abdominal pain or cramping.   Patient states she has had numerous bowel movements since yesterday.  No blood noted in BM.  Patient voices that she otherwise feels well and is ready for discharge.  Objective: Vitals:   02/20/16 0820 02/20/16 0830 02/20/16 1150 02/20/16 1347  BP:    101/60  Pulse: 74  64 66  Resp: 16  18 18   Temp:    99.1 F (37.3 C)  TempSrc:    Oral  SpO2: 95% 95% 96% 99%  Weight:      Height:        Intake/Output Summary (Last 24 hours) at 02/20/16 1407 Last data filed at 02/20/16 1349  Gross per 24 hour  Intake              660 ml  Output             1425 ml  Net             -765 ml   Filed Weights   02/13/16 1133  Weight: (!) 145.1 kg (319 lb 14.2 oz)    Examination:  General exam: Appears calm and comfortable  Respiratory system: Clear to auscultation. Respiratory effort normal. Some upper airway noise in upper lung fields Cardiovascular system: S1 & S2 heard, RRR. No JVD, murmurs, rubs, gallops or clicks. No pedal edema. Gastrointestinal system: Abdomen is obese, soft and nontender. No organomegaly or masses felt. Normal bowel sounds heard. Central nervous system: Alert and oriented. No focal neurological deficits. Extremities: Symmetric 5 x 5 power. Skin: No rashes, lesions or ulcers Psychiatry: Judgement  and insight appear normal. Mood & affect appropriate.     Data Reviewed: I have personally reviewed following labs and imaging studies  CBC:  Recent Labs Lab 02/16/16 1232 02/19/16 0545  WBC 14.5* 9.9  HGB 10.8* 9.7*  HCT 34.5* 31.7*  MCV 89.6 89.5  PLT 309 331   Basic Metabolic Panel:  Recent Labs Lab 02/16/16 1232  NA 140  K 3.8  CL 105  CO2 27  GLUCOSE 136*  BUN 8  CREATININE 1.03*  CALCIUM 9.0   GFR: Estimated Creatinine Clearance: 93.6 mL/min (by C-G formula based on SCr of 1.03 mg/dL). Liver Function Tests:  Recent Labs Lab 02/16/16 1232  AST 27  ALT 31  ALKPHOS 83  BILITOT 0.7  PROT 6.6  ALBUMIN 3.0*    Recent Labs Lab  02/16/16 1232  LIPASE 19  AMYLASE 40   No results for input(s): AMMONIA in the last 168 hours. Coagulation Profile: No results for input(s): INR, PROTIME in the last 168 hours. Cardiac Enzymes: No results for input(s): CKTOTAL, CKMB, CKMBINDEX, TROPONINI in the last 168 hours. BNP (last 3 results) No results for input(s): PROBNP in the last 8760 hours. HbA1C: No results for input(s): HGBA1C in the last 72 hours. CBG:  Recent Labs Lab 02/19/16 1159 02/19/16 1704 02/19/16 2229 02/20/16 0737 02/20/16 1115  GLUCAP 112* 102* 104* 131* 128*   Lipid Profile: No results for input(s): CHOL, HDL, LDLCALC, TRIG, CHOLHDL, LDLDIRECT in the last 72 hours. Thyroid Function Tests: No results for input(s): TSH, T4TOTAL, FREET4, T3FREE, THYROIDAB in the last 72 hours. Anemia Panel: No results for input(s): VITAMINB12, FOLATE, FERRITIN, TIBC, IRON, RETICCTPCT in the last 72 hours. Sepsis Labs: No results for input(s): PROCALCITON, LATICACIDVEN in the last 168 hours.  Recent Results (from the past 240 hour(s))  MRSA PCR Screening     Status: None   Collection Time: 02/13/16  4:40 PM  Result Value Ref Range Status   MRSA by PCR NEGATIVE NEGATIVE Final    Comment:        The GeneXpert MRSA Assay (FDA approved for NASAL specimens only), is one component of a comprehensive MRSA colonization surveillance program. It is not intended to diagnose MRSA infection nor to guide or monitor treatment for MRSA infections.          Radiology Studies: No results found.      Scheduled Meds: . amLODipine  10 mg Oral q morning - 10a  . antiseptic oral rinse  7 mL Mouth Rinse q12n4p  . atenolol  50 mg Oral Daily  . bacitracin  1 application Topical BID  . cephALEXin  500 mg Oral Q8H  . chlorhexidine  15 mL Mouth Rinse BID  . ferrous sulfate  325 mg Oral Daily  . gabapentin  300 mg Oral QHS  . hydrochlorothiazide  25 mg Oral Daily  . insulin aspart  0-9 Units Subcutaneous TID WC  .  lidocaine  1 patch Transdermal Q24H  . pantoprazole  40 mg Oral Daily  . pregabalin  50 mg Oral Daily  . saccharomyces boulardii  250 mg Oral BID  . traZODone  300 mg Oral Daily   Continuous Infusions: . dextrose 5 % and 0.45 % NaCl with KCl 20 mEq/L 125 mL/hr at 02/20/16 0442     LOS: 7 days    Time spent: 20 minutes    Bennett Scrape, MD [facility] Pager [phone]  If 7PM-7AM, please contact night-coverage [email or web address] Password [email or web address] 02/20/2016, 2:07 PM

## 2016-02-20 NOTE — Clinical Social Work Note (Signed)
Clinical Social Worker facilitated patient discharge including contacting patient and facility to confirm patient discharge plans.  Clinical information faxed to facility and patient agreeable with plan.  Patient states that she will notify family of discharge plans.  CSW arranged ambulance transport via [facility] to [facility].  RN to call report prior to discharge.  Clinical Social Worker will sign off for now as social work intervention is no longer needed. Please consult us again if new need arises.  Macario GoldsJesse Afsa Meany, Kentucky[place]CSW [phone]

## 2016-02-20 NOTE — Progress Notes (Signed)
  Late Entry  Pt seen for further training and tolerance of PMSV. Pt verbalized awareness of precautions, but stated that RNs had been taking care of placing and removing the valve for her. SLP attempted cueing pt to remove valve, she could not because valve was placed so tightly. Reinforced that only gentle placement is needed, and after SLP loosened the valve pt demonstrated independent placement and removal 5 times and 3 more times after a delay. Also discussed cleaning. Pt continues to tolerate the PMSV and is able to increase breath support to louder phonation as needed. Continue current plan of care.     02/19/16 1000  SLP Visit Information  SLP Received On 02/19/16  General Information  Behavior/Cognition Alert;Cooperative;Pleasant mood  HPI Blenda BridegroomSonja Chavisis an [age] y.o.femalewith multiple medical problems not limited to CKD, DM2, HTN, severe OSA, fibromyalgia and obesity. Patient has severe OSA. She could not tolerate cpap. S/p tracheostomy 8/5.    Treatment Provided  Treatment provided Passy Muir Speaking valve  PMSV Trial  PMSV was placed for (in place on arrival)  Able to redirect subglottic air through upper airway Yes  Able to Attain Phonation Yes  Voice Quality Low vocal intensity  Able to Expectorate Secretions Yes  Level of Secretion Expectoration with PMSV Oral  Breath Support for Phonation Moderately decreased  Intelligibility Intelligibility reduced  Word 75-100% accurate  Phrase 75-100% accurate  Sentence 75-100% accurate  SpO2 During Trial 100 %  Pulse During Trial 88  SLP - End of Session  Patient left in bed;with nursing in room  Assessment / Recommendations / Plan  Plan Continue with current plan of care  PMSV Recommendations  Patient may use Passy-Muir Speech Valve During PO intake/meals;During all waking hours (remove during sleep);During all therapies with supervision  PMSV Supervision Intermittent  SLP Time Calculation  SLP Start Time (ACUTE ONLY) 1030   SLP Stop Time (ACUTE ONLY) 1040  SLP Time Calculation (min) (ACUTE ONLY) 10 min  SLP Evaluations  $ SLP Speech Visit 1 Procedure  SLP Evaluations  $Speech Treatment for Individual 1 Procedure

## 2016-02-21 ENCOUNTER — Emergency Department (HOSPITAL_COMMUNITY): Payer: Medicaid Other

## 2016-02-21 ENCOUNTER — Inpatient Hospital Stay (HOSPITAL_COMMUNITY)
Admission: EM | Admit: 2016-02-21 | Discharge: 2016-02-25 | DRG: 205 | Disposition: A | Discharge status: Skilled Nursing Facility | Payer: Medicaid Other | Attending: Internal Medicine | Admitting: Internal Medicine

## 2016-02-21 ENCOUNTER — Encounter (HOSPITAL_COMMUNITY): Payer: Self-pay | Admitting: Emergency Medicine

## 2016-02-21 DIAGNOSIS — L0390 Cellulitis, unspecified: Secondary | ICD-10-CM | POA: Diagnosis present

## 2016-02-21 DIAGNOSIS — I152 Hypertension secondary to endocrine disorders: Secondary | ICD-10-CM | POA: Diagnosis present

## 2016-02-21 DIAGNOSIS — E1140 Type 2 diabetes mellitus with diabetic neuropathy, unspecified: Secondary | ICD-10-CM | POA: Diagnosis present

## 2016-02-21 DIAGNOSIS — R079 Chest pain, unspecified: Secondary | ICD-10-CM | POA: Diagnosis present

## 2016-02-21 DIAGNOSIS — I129 Hypertensive chronic kidney disease with stage 1 through stage 4 chronic kidney disease, or unspecified chronic kidney disease: Secondary | ICD-10-CM | POA: Diagnosis present

## 2016-02-21 DIAGNOSIS — D7589 Other specified diseases of blood and blood-forming organs: Secondary | ICD-10-CM | POA: Diagnosis present

## 2016-02-21 DIAGNOSIS — M797 Fibromyalgia: Secondary | ICD-10-CM | POA: Diagnosis present

## 2016-02-21 DIAGNOSIS — E1159 Type 2 diabetes mellitus with other circulatory complications: Secondary | ICD-10-CM | POA: Diagnosis present

## 2016-02-21 DIAGNOSIS — K1120 Sialoadenitis, unspecified: Secondary | ICD-10-CM | POA: Diagnosis present

## 2016-02-21 DIAGNOSIS — L89893 Pressure ulcer of other site, stage 3: Secondary | ICD-10-CM | POA: Diagnosis present

## 2016-02-21 DIAGNOSIS — E1122 Type 2 diabetes mellitus with diabetic chronic kidney disease: Secondary | ICD-10-CM | POA: Diagnosis present

## 2016-02-21 DIAGNOSIS — I10 Essential (primary) hypertension: Secondary | ICD-10-CM

## 2016-02-21 DIAGNOSIS — D6489 Other specified anemias: Secondary | ICD-10-CM | POA: Diagnosis present

## 2016-02-21 DIAGNOSIS — N183 Chronic kidney disease, stage 3 (moderate): Secondary | ICD-10-CM | POA: Diagnosis present

## 2016-02-21 DIAGNOSIS — D649 Anemia, unspecified: Secondary | ICD-10-CM | POA: Diagnosis present

## 2016-02-21 DIAGNOSIS — E569 Vitamin deficiency, unspecified: Secondary | ICD-10-CM | POA: Diagnosis present

## 2016-02-21 DIAGNOSIS — J9509 Other tracheostomy complication: Principal | ICD-10-CM | POA: Diagnosis present

## 2016-02-21 DIAGNOSIS — E785 Hyperlipidemia, unspecified: Secondary | ICD-10-CM | POA: Diagnosis present

## 2016-02-21 DIAGNOSIS — R042 Hemoptysis: Secondary | ICD-10-CM

## 2016-02-21 DIAGNOSIS — G4733 Obstructive sleep apnea (adult) (pediatric): Secondary | ICD-10-CM | POA: Diagnosis present

## 2016-02-21 DIAGNOSIS — Z6841 Body Mass Index (BMI) 40.0 and over, adult: Secondary | ICD-10-CM

## 2016-02-21 LAB — COMPREHENSIVE METABOLIC PANEL
ALT: 33 U/L (ref 14–54)
AST: 29 U/L (ref 15–41)
Albumin: 3 g/dL — ABNORMAL LOW (ref 3.5–5.0)
Alkaline Phosphatase: 101 U/L (ref 38–126)
Anion gap: 8 (ref 5–15)
BUN: 9 mg/dL (ref 6–20)
CHLORIDE: 100 mmol/L — AB (ref 101–111)
CO2: 29 mmol/L (ref 22–32)
Calcium: 8.9 mg/dL (ref 8.9–10.3)
Creatinine, Ser: 1.13 mg/dL — ABNORMAL HIGH (ref 0.44–1.00)
GFR, EST NON AFRICAN AMERICAN: 54 mL/min — AB (ref >60)
Glucose, Bld: 107 mg/dL — ABNORMAL HIGH (ref 65–99)
POTASSIUM: 4.4 mmol/L (ref 3.5–5.1)
Sodium: 137 mmol/L (ref 135–145)
Total Bilirubin: 1 mg/dL (ref 0.3–1.2)
Total Protein: 7.2 g/dL (ref 6.5–8.1)

## 2016-02-21 LAB — CBC WITH DIFFERENTIAL/PLATELET
Basophils Absolute: 0 10*3/uL (ref 0.0–0.1)
Basophils Relative: 0 %
Eosinophils Absolute: 0.3 10*3/uL (ref 0.0–0.7)
Eosinophils Relative: 2 %
HCT: 36.9 % (ref 36.0–46.0)
HEMOGLOBIN: 12.1 g/dL (ref 12.0–15.0)
LYMPHS ABS: 4.4 10*3/uL — AB (ref 0.7–4.0)
LYMPHS PCT: 37 %
MCH: 28.6 pg (ref 26.0–34.0)
MCHC: 32.8 g/dL (ref 30.0–36.0)
MCV: 87.2 fL (ref 78.0–100.0)
Monocytes Absolute: 1.1 10*3/uL — ABNORMAL HIGH (ref 0.1–1.0)
Monocytes Relative: 10 %
NEUTROS ABS: 6 10*3/uL (ref 1.7–7.7)
NEUTROS PCT: 51 %
PLATELETS: 404 10*3/uL — AB (ref 150–400)
RBC: 4.23 MIL/uL (ref 3.87–5.11)
RDW: 13.8 % (ref 11.5–15.5)
WBC: 11.8 10*3/uL — AB (ref 4.0–10.5)

## 2016-02-21 LAB — I-STAT CG4 LACTIC ACID, ED: LACTIC ACID, VENOUS: 1.13 mmol/L (ref 0.5–1.9)

## 2016-02-21 LAB — I-STAT TROPONIN, ED: TROPONIN I, POC: 0.01 ng/mL (ref 0.00–0.08)

## 2016-02-21 MED ORDER — ONDANSETRON HCL 4 MG/2ML IJ SOLN
4.0000 mg | Freq: Once | INTRAMUSCULAR | Status: AC
  Administered 2016-02-21: 4 mg via INTRAVENOUS

## 2016-02-21 MED ORDER — CLINDAMYCIN PHOSPHATE 600 MG/50ML IV SOLN: 600.0000 mg | Freq: Once | INTRAVENOUS | Status: DC

## 2016-02-21 MED ORDER — ONDANSETRON HCL 4 MG/2ML IJ SOLN
INTRAMUSCULAR | Status: AC
  Filled 2016-02-21: qty 2

## 2016-02-21 MED ORDER — IPRATROPIUM-ALBUTEROL 0.5-2.5 (3) MG/3ML IN SOLN
3.0000 mL | RESPIRATORY_TRACT | Status: AC
  Administered 2016-02-21: 3 mL via RESPIRATORY_TRACT
  Filled 2016-02-21: qty 3

## 2016-02-21 MED ORDER — HYDROMORPHONE HCL 1 MG/ML IJ SOLN
1.0000 mg | Freq: Once | INTRAMUSCULAR | Status: AC
  Administered 2016-02-21: 1 mg via INTRAVENOUS

## 2016-02-21 MED ORDER — HYDROMORPHONE HCL 1 MG/ML IJ SOLN
INTRAMUSCULAR | Status: AC
  Filled 2016-02-21: qty 1

## 2016-02-21 NOTE — ED Triage Notes (Signed)
[facility] for trach problems, pt states she feels like trach is clogged and she can hardly cash a breath, pt had this trach placed this week and send to rehab yesterday to rehab center, pt c/o mid CP 9/10, labored breathing. Pt  SPO2 92% on 5L through the trach, BP 122/65, HR 80, R25.

## 2016-02-21 NOTE — ED Provider Notes (Signed)
[facility]-EMERGENCY DEPT Provider Note   CSN: [id number] Arrival date & time: 02/21/16  1925  First Provider Contact:  None       History   Chief Complaint Chief Complaint  Patient presents with  . Shortness of Breath     Trach pt  . Chest Pain    HPI Yesenia [name] is a 55 y.o. female.   Shortness of Breath  This is a new problem. Duration: 1 day. The problem occurs continuously.The current episode started yesterday. The problem has been gradually worsening. Associated symptoms include neck pain (bilat, stemming from trach site), cough (mild), sputum production (blood tinged), chest pain (with coughing and radiating down from trach site) and rash. Pertinent negatives include no fever, no rhinorrhea, no sore throat, no hemoptysis (no frank bleeding reported), no syncope, no vomiting, no abdominal pain and no leg pain. Precipitated by: since left hospital after trach. Risk factors: new trach. She has tried nothing (suctioning ? at facility) for the symptoms. She has had prior hospitalizations. She has had prior ED visits. Associated medical issues include recent surgery. Associated medical issues do not include pneumonia, chronic lung disease, PE, CAD, heart failure, past MI or DVT.    Past Medical History:  Diagnosis Date  . Anemia   . Anemia   . Chronic pain   . CKD (chronic kidney disease)   . Diabetes mellitus without complication (HCC)    diet controlled. does not check CBG's  . Fibromyalgia    hospitilized 12/16 due to inability to walk  . Hyperlipemia   . Hypertension   . Insomnia   . Neuropathy (HCC)   . Right sided weakness   . Stroke [facility]) 2014?   no residual weakness   . Vitamin deficiency    Vit D    Patient Active Problem List   Diagnosis Date Noted  . Abdominal pain 02/16/2016  . Constipation 02/16/2016  . CKD (chronic kidney disease) stage 3, GFR 30-59 ml/min 02/16/2016  . Fever   . Cough   . Sleep apnea 02/13/2016  . Insomnia 08/10/2015  .  Polyneuropathy (HCC) 07/10/2015  . GERD (gastroesophageal reflux disease) 07/10/2015  . Acute kidney injury superimposed on chronic kidney disease (HCC) 07/07/2015  . Gait disturbance 07/07/2015  . Morbid obesity (HCC) 07/07/2015  . Left hip pain 07/06/2015  . Chronic pain   . Right sided weakness   . Headache(784.0) 04/14/2013  . Encephalopathy acute 04/11/2013  . Acute pyelonephritis 04/11/2013  . Diabetes mellitus, type 2 (HCC) 04/11/2013  . Dyslipidemia 04/11/2013  . CVA (cerebral infarction) 01/14/2013  . HTN (hypertension) 01/14/2013  . Anemia 01/14/2013    Past Surgical History:  Procedure Laterality Date  . KNEE SURGERY  1992, 1995, 1997  . LEG SURGERY Right 2007   Surgery x3 2 arthroscopies and one rod  . OOPHORECTOMY Right 1993?  . TRACHEOSTOMY TUBE PLACEMENT N/A 02/13/2016   Procedure: TRACHEOSTOMY;  Surgeon: Christia Reading, MD;  Location: [facility] OR;  Service: ENT;  Laterality: N/A;    OB History    No data available       Home Medications    Prior to Admission medications   Medication Sig Start Date End Date Taking? Authorizing Provider  albuterol (PROVENTIL HFA;VENTOLIN HFA) 108 (90 BASE) MCG/ACT inhaler Inhale 2 puffs into the lungs every 6 (six) hours as needed for wheezing.    Historical Provider, MD  amLODipine (NORVASC) 10 MG tablet Take 10 mg by mouth every morning.     Historical Provider, MD  atenolol (TENORMIN) 50 MG tablet Take 50 mg by mouth daily. 11/29/14   Historical Provider, MD  bacitracin ointment Apply 1 application topically 2 (two) times daily. To trach site 02/20/16   Christia Reading, MD  cephALEXin (KEFLEX) 500 MG capsule Take 1 capsule (500 mg total) by mouth every 8 (eight) hours. 02/20/16   Christia Reading, MD  ferrous sulfate 325 (65 FE) MG tablet Take 325 mg by mouth daily. 09/13/14   Historical Provider, MD  gabapentin (NEURONTIN) 300 MG capsule Take 300 mg by mouth at bedtime. 10/29/15   Historical Provider, MD  hydrochlorothiazide (HYDRODIURIL) 25  MG tablet Take 25 mg by mouth daily. 09/13/14   Historical Provider, MD  ondansetron (ZOFRAN) 4 MG tablet Take 4 mg by mouth every 6 (six) hours as needed for nausea or vomiting.    Historical Provider, MD  pantoprazole (PROTONIX) 40 MG tablet Take 1 tablet (40 mg total) by mouth daily. 02/20/16   Christia Reading, MD  pregabalin (LYRICA) 50 MG capsule Take 50 mg by mouth daily.    Historical Provider, MD  saccharomyces boulardii (FLORASTOR) 250 MG capsule Take 250 mg by mouth 2 (two) times daily.    Historical Provider, MD  traZODone (DESYREL) 150 MG tablet Take 300 mg by mouth daily. 01/10/16   Historical Provider, MD    Family History Family History  Problem Relation Age of Onset  . Breast cancer Mother   . Leukemia Brother   . Breast cancer Maternal Grandmother     Social History Social History  Substance Use Topics  . Smoking status: Never Smoker  . Smokeless tobacco: Never Used  . Alcohol use No     Allergies   Buprenorphine hcl and Morphine and related   Review of Systems Review of Systems  Constitutional: Negative for chills, diaphoresis and fever.  HENT: Negative for rhinorrhea and sore throat.   Eyes: Negative.   Respiratory: Positive for cough (mild), sputum production (blood tinged) and shortness of breath. Negative for hemoptysis (no frank bleeding reported).   Cardiovascular: Positive for chest pain (with coughing and radiating down from trach site). Negative for syncope.  Gastrointestinal: Negative for abdominal pain and vomiting.  Genitourinary: Negative.   Musculoskeletal: Positive for neck pain (bilat, stemming from trach site).  Skin: Positive for rash.  Neurological: Negative.   Psychiatric/Behavioral: Negative.      Physical Exam Updated Vital Signs BP 121/80 (BP Location: Left Arm)   Pulse 81   Temp 98.9 F (37.2 C) (Oral)   Resp 18   Ht  (1.702 m)   Wt (!) 144.7 kg   SpO2 95%   BMI 49.96 kg/m   Physical Exam  Constitutional: She is  oriented to person, place, and time. She appears well-developed and well-nourished. No distress.  HENT:  Head: Normocephalic and atraumatic.  Eyes: Conjunctivae are normal. Right eye exhibits no discharge. Left eye exhibits no discharge. No scleral icterus.  Neck: Normal range of motion. Neck supple. No tracheal deviation present.  Trach in place Secretions hanging along trach Query purulence at trache site No bleeding noted Diffuse anterior neck/upper chest erythema  Cardiovascular: Normal rate, regular rhythm, normal heart sounds and intact distal pulses.   No murmur heard. Pulmonary/Chest: Effort normal. No stridor. No respiratory distress. She has wheezes (faint). She has no rales.  Abdominal: Soft. She exhibits no distension. There is no tenderness. There is no rebound and no guarding.  Musculoskeletal: She exhibits no edema or deformity.  Neurological: She is alert and  oriented to person, place, and time. GCS eye subscore is 4. GCS verbal subscore is 5. GCS motor subscore is 6.  Skin: Skin is warm and dry. Capillary refill takes less than 2 seconds. Rash noted.     Nursing note and vitals reviewed.    ED Treatments / Results  Labs (all labs ordered are listed, but only abnormal results are displayed) Labs Reviewed  COMPREHENSIVE METABOLIC PANEL - Abnormal; Notable for the following:       Result Value   Chloride 100 (*)    Glucose, Bld 107 (*)    Creatinine, Ser 1.13 (*)    Albumin 3.0 (*)    GFR calc non Af Amer 54 (*)    All other components within normal limits  CBC WITH DIFFERENTIAL/PLATELET - Abnormal; Notable for the following:    WBC 11.8 (*)    Platelets 404 (*)    Lymphs Abs 4.4 (*)    Monocytes Absolute 1.1 (*)    All other components within normal limits  BASIC METABOLIC PANEL - Abnormal; Notable for the following:    Chloride 97 (*)    Glucose, Bld 138 (*)    Creatinine, Ser 1.54 (*)    GFR calc non Af Amer 37 (*)    GFR calc Af Amer 43 (*)    All  other components within normal limits  GLUCOSE, CAPILLARY - Abnormal; Notable for the following:    Glucose-Capillary 124 (*)    All other components within normal limits  HEMOGLOBIN AND HEMATOCRIT, BLOOD - Abnormal; Notable for the following:    Hemoglobin 10.4 (*)    HCT 33.3 (*)    All other components within normal limits  HEMOGLOBIN AND HEMATOCRIT, BLOOD - Abnormal; Notable for the following:    Hemoglobin 10.4 (*)    HCT 32.4 (*)    All other components within normal limits  HEMOGLOBIN AND HEMATOCRIT, BLOOD - Abnormal; Notable for the following:    Hemoglobin 10.6 (*)    HCT 33.5 (*)    All other components within normal limits  HEMOGLOBIN AND HEMATOCRIT, BLOOD - Abnormal; Notable for the following:    Hemoglobin 10.3 (*)    HCT 32.1 (*)    All other components within normal limits  TROPONIN I - Abnormal; Notable for the following:    Troponin I 0.03 (*)    All other components within normal limits  CULTURE, BLOOD (ROUTINE X 2) W REFLEX TO ID PANEL  CULTURE, BLOOD (SINGLE)  HIV ANTIBODY (ROUTINE TESTING)  CBC  BASIC METABOLIC PANEL  I-STAT TROPOININ, ED  I-STAT CG4 LACTIC ACID, ED  TYPE AND SCREEN  ABO/RH    EKG  EKG Interpretation  Date/Time:  Saturday February 21 2016 20:00:59 EDT Ventricular Rate:  74 PR Interval:    QRS Duration: 104 QT Interval:  408 QTC Calculation: 453 R Axis:   2 Text Interpretation:  Sinus rhythm Left ventricular hypertrophy No significant change since last tracing Confirmed by Denton LankSTEINL  MD, Caryn BeeKEVIN ([id number]) on 02/21/2016 9:22:44 PM       Radiology Dg Chest Port 1 View  Result Date: 02/21/2016 CLINICAL DATA:  Chest pain EXAM: PORTABLE CHEST 1 VIEW COMPARISON:  02/16/2016 FINDINGS: Cardiac shadow remains enlarged. Tracheostomy tube is noted in satisfactory position. The lungs are well aerated. Stable bibasilar changes are again seen. No new focal infiltrate is noted. IMPRESSION: Stable bibasilar atelectasis/scarring. Electronically Signed    By: Alcide CleverMark  Lukens M.D.   On: 02/21/2016 20:01    Procedures Procedures (  including critical care time)  Medications Ordered in ED  Clindamycin 600mg  IV Duoneb x1 Zofran 4mg  IV x1 Dilaudid 1mg  x1  Initial Impression / Assessment and Plan / ED Course  I have reviewed the triage vital signs and the nursing notes.  Pertinent labs & imaging results that were available during my care of the patient were reviewed by me and considered in my medical decision making (see chart for details).  Clinical Course   55 yo F with recent trach placement 2/2 OSA. Patient was discharged yesterday on keflex due to concern for possible early site cellulitis. Patient also complained of shortness of breath and chest pain, worse with coughing.  Patient had a screening EKG and troponin,  both of which were not concerning for ACS. Chest x-ray was also obtained and did not reveal any anatomic  abnormalities leading to shortness of breath including pneumothorax or subcutaneous air.  Trach in proper position. However most concerning was the appearance of the skin adjacent to her trach. It was erythematous without fluctuance.  Concern for cellulitis.  Trach site border also appeared concerning. Patient denied any fever chills or night sweats  Due to worsening cellulitis while on antibiotics in the setting of her recent tracheostomy.  Would like to admit patient for observation until symptoms improve.  After proper TrachCare and nebulizer treatment patient's shortness of breath improved. Patient was given Zofran and Dilaudid for her neck pain as well. No subQ air noted on exam. Blood work was grossly unremarkable for a mild increase in creatinine and a mild leukocytosis to 11.8. Lactic acid was within normal limits. Discussed results with patient.  Discussed plan and she stated understanding and agreement with it.  All questions answered.  Patient and vital signs stable until transfer.  Final Clinical Impressions(s) / ED  Diagnoses   Final diagnoses:  None  Cellulitis Leukocytosis  New Prescriptions New Prescriptions   No medications on file     Maretta Bees, MD 02/23/16 1610    Cathren Laine, MD 02/26/16 1414

## 2016-02-21 NOTE — ED Notes (Signed)
Trach care given by Selena BattenKim, RT, dressing and straps changed, pt suctioned by RT, some bloody secretion present. Pt tolerated well NAD noticed.

## 2016-02-21 NOTE — Progress Notes (Signed)
RT NOTE:  Pt arrived by EMS in distress complaining trach was hurting. Pt stated "rehab did something to trach yesterday morning and it has been hurting more since then."  Pt displays increased respiratory effort and rate to 30 bpm. Deep suctioned with minimal blood tinged secretions. Pt agrees the discomfort is more in the area around trach rather than inside trach. RT noted foul odor coming from trach, tissue around trach is red and inflamed with bleeding from stoma. [name] ties were very tight around neck, no drain sponge in place. Trach care preformed, inner cannula is patent. Duoneb aerosol treatment started for coarse wheezing throughout all lung fields. 28% ATC will be applied following treatment.  RT monitoring.

## 2016-02-22 ENCOUNTER — Encounter (HOSPITAL_COMMUNITY): Payer: Self-pay | Admitting: Family Medicine

## 2016-02-22 ENCOUNTER — Observation Stay (HOSPITAL_COMMUNITY): Payer: Medicaid Other

## 2016-02-22 DIAGNOSIS — I129 Hypertensive chronic kidney disease with stage 1 through stage 4 chronic kidney disease, or unspecified chronic kidney disease: Secondary | ICD-10-CM | POA: Diagnosis present

## 2016-02-22 DIAGNOSIS — L03221 Cellulitis of neck: Secondary | ICD-10-CM | POA: Diagnosis not present

## 2016-02-22 DIAGNOSIS — M797 Fibromyalgia: Secondary | ICD-10-CM | POA: Diagnosis present

## 2016-02-22 DIAGNOSIS — L0390 Cellulitis, unspecified: Secondary | ICD-10-CM | POA: Diagnosis present

## 2016-02-22 DIAGNOSIS — E1140 Type 2 diabetes mellitus with diabetic neuropathy, unspecified: Secondary | ICD-10-CM | POA: Diagnosis present

## 2016-02-22 DIAGNOSIS — R079 Chest pain, unspecified: Secondary | ICD-10-CM | POA: Diagnosis not present

## 2016-02-22 DIAGNOSIS — J9509 Other tracheostomy complication: Secondary | ICD-10-CM | POA: Diagnosis present

## 2016-02-22 DIAGNOSIS — E785 Hyperlipidemia, unspecified: Secondary | ICD-10-CM | POA: Diagnosis present

## 2016-02-22 DIAGNOSIS — D649 Anemia, unspecified: Secondary | ICD-10-CM | POA: Diagnosis present

## 2016-02-22 DIAGNOSIS — I10 Essential (primary) hypertension: Secondary | ICD-10-CM | POA: Diagnosis not present

## 2016-02-22 DIAGNOSIS — D6489 Other specified anemias: Secondary | ICD-10-CM | POA: Diagnosis present

## 2016-02-22 DIAGNOSIS — D7589 Other specified diseases of blood and blood-forming organs: Secondary | ICD-10-CM | POA: Diagnosis present

## 2016-02-22 DIAGNOSIS — Z6841 Body Mass Index (BMI) 40.0 and over, adult: Secondary | ICD-10-CM | POA: Diagnosis not present

## 2016-02-22 DIAGNOSIS — K1120 Sialoadenitis, unspecified: Secondary | ICD-10-CM | POA: Diagnosis present

## 2016-02-22 DIAGNOSIS — R0600 Dyspnea, unspecified: Secondary | ICD-10-CM | POA: Diagnosis present

## 2016-02-22 DIAGNOSIS — E569 Vitamin deficiency, unspecified: Secondary | ICD-10-CM | POA: Diagnosis present

## 2016-02-22 DIAGNOSIS — L89893 Pressure ulcer of other site, stage 3: Secondary | ICD-10-CM | POA: Diagnosis present

## 2016-02-22 DIAGNOSIS — E1122 Type 2 diabetes mellitus with diabetic chronic kidney disease: Secondary | ICD-10-CM | POA: Diagnosis present

## 2016-02-22 DIAGNOSIS — G4733 Obstructive sleep apnea (adult) (pediatric): Secondary | ICD-10-CM | POA: Diagnosis present

## 2016-02-22 DIAGNOSIS — N183 Chronic kidney disease, stage 3 (moderate): Secondary | ICD-10-CM | POA: Diagnosis present

## 2016-02-22 LAB — BASIC METABOLIC PANEL
ANION GAP: 12 (ref 5–15)
BUN: 11 mg/dL (ref 6–20)
CO2: 30 mmol/L (ref 22–32)
CREATININE: 1.54 mg/dL — AB (ref 0.44–1.00)
Calcium: 9.1 mg/dL (ref 8.9–10.3)
Chloride: 97 mmol/L — ABNORMAL LOW (ref 101–111)
GFR calc Af Amer: 43 mL/min — ABNORMAL LOW (ref >60)
GFR, EST NON AFRICAN AMERICAN: 37 mL/min — AB (ref >60)
Glucose, Bld: 138 mg/dL — ABNORMAL HIGH (ref 65–99)
POTASSIUM: 3.6 mmol/L (ref 3.5–5.1)
SODIUM: 139 mmol/L (ref 135–145)

## 2016-02-22 LAB — TYPE AND SCREEN
ABO/RH(D): O POS
ANTIBODY SCREEN: NEGATIVE

## 2016-02-22 LAB — HEMOGLOBIN AND HEMATOCRIT, BLOOD
HCT: 33.3 % — ABNORMAL LOW (ref 36.0–46.0)
HCT: 32.4 % — ABNORMAL LOW (ref 36.0–46.0)
HCT: 33.5 % — ABNORMAL LOW (ref 36.0–46.0)
HEMATOCRIT: 32.1 % — AB (ref 36.0–46.0)
Hemoglobin: 10.4 g/dL — ABNORMAL LOW (ref 12.0–15.0)
Hemoglobin: 10.4 g/dL — ABNORMAL LOW (ref 12.0–15.0)
Hemoglobin: 10.6 g/dL — ABNORMAL LOW (ref 12.0–15.0)
Hemoglobin: 10.3 g/dL — ABNORMAL LOW (ref 12.0–15.0)

## 2016-02-22 LAB — HIV ANTIBODY (ROUTINE TESTING W REFLEX): HIV SCREEN 4TH GENERATION: NONREACTIVE

## 2016-02-22 LAB — TROPONIN I: Troponin I: 0.03 ng/mL (ref <0.03)

## 2016-02-22 LAB — ABO/RH: ABO/RH(D): O POS

## 2016-02-22 LAB — GLUCOSE, CAPILLARY: GLUCOSE-CAPILLARY: 124 mg/dL — AB (ref 65–99)

## 2016-02-22 MED ORDER — BISACODYL 5 MG PO TBEC
5.0000 mg | DELAYED_RELEASE_TABLET | Freq: Every day | ORAL | Status: DC | PRN
  Filled 2016-02-22: qty 1

## 2016-02-22 MED ORDER — IOPAMIDOL (ISOVUE-300) INJECTION 61%
INTRAVENOUS | Status: AC
  Filled 2016-02-22: qty 100

## 2016-02-22 MED ORDER — SODIUM CHLORIDE 0.9 % IV SOLN: 250.0000 mL | INTRAVENOUS | Status: DC | PRN

## 2016-02-22 MED ORDER — TRAZODONE HCL 100 MG PO TABS
300.0000 mg | ORAL_TABLET | Freq: Every day | ORAL | Status: DC
  Administered 2016-02-22 – 2016-02-24 (×4): 300 mg via ORAL
  Filled 2016-02-22 (×4): qty 3

## 2016-02-22 MED ORDER — POLYETHYLENE GLYCOL 3350 17 G PO PACK: 17.0000 g | PACK | Freq: Every day | ORAL | Status: DC | PRN

## 2016-02-22 MED ORDER — ACETAMINOPHEN 650 MG RE SUPP: 650.0000 mg | Freq: Four times a day (QID) | RECTAL | Status: DC | PRN

## 2016-02-22 MED ORDER — SODIUM CHLORIDE 0.9% FLUSH
3.0000 mL | Freq: Two times a day (BID) | INTRAVENOUS | Status: DC
  Administered 2016-02-23 – 2016-02-24 (×2): 3 mL via INTRAVENOUS

## 2016-02-22 MED ORDER — AMLODIPINE BESYLATE 5 MG PO TABS: 10.0000 mg | ORAL_TABLET | Freq: Every morning | ORAL | Status: DC

## 2016-02-22 MED ORDER — ACETAMINOPHEN 325 MG PO TABS
650.0000 mg | ORAL_TABLET | Freq: Four times a day (QID) | ORAL | Status: DC | PRN
Start: 2016-02-21 — End: 2016-02-25

## 2016-02-22 MED ORDER — CEFAZOLIN SODIUM-DEXTROSE 2-4 GM/100ML-% IV SOLN
2.0000 g | Freq: Three times a day (TID) | INTRAVENOUS | Status: DC
  Administered 2016-02-22 – 2016-02-24 (×10): 2 g via INTRAVENOUS
  Filled 2016-02-22 (×15): qty 100

## 2016-02-22 MED ORDER — BENZONATATE 100 MG PO CAPS
100.0000 mg | ORAL_CAPSULE | Freq: Three times a day (TID) | ORAL | Status: DC | PRN
  Administered 2016-02-22 – 2016-02-25 (×8): 100 mg via ORAL
  Filled 2016-02-22 (×8): qty 1

## 2016-02-22 MED ORDER — SODIUM CHLORIDE 0.9 % IV SOLN
INTRAVENOUS | Status: AC
  Administered 2016-02-22: 03:00:00 via INTRAVENOUS

## 2016-02-22 MED ORDER — PANTOPRAZOLE SODIUM 40 MG PO TBEC
40.0000 mg | DELAYED_RELEASE_TABLET | Freq: Every day | ORAL | Status: DC
  Administered 2016-02-22 – 2016-02-25 (×4): 40 mg via ORAL
  Filled 2016-02-22 (×4): qty 1

## 2016-02-22 MED ORDER — IOPAMIDOL (ISOVUE-300) INJECTION 61%
75.0000 mL | Freq: Once | INTRAVENOUS | Status: AC | PRN
  Administered 2016-02-22: 75 mL via INTRAVENOUS

## 2016-02-22 MED ORDER — SODIUM CHLORIDE 0.9% FLUSH
3.0000 mL | Freq: Two times a day (BID) | INTRAVENOUS | Status: DC
  Administered 2016-02-22 – 2016-02-24 (×6): 3 mL via INTRAVENOUS

## 2016-02-22 MED ORDER — SACCHAROMYCES BOULARDII 250 MG PO CAPS
250.0000 mg | ORAL_CAPSULE | Freq: Two times a day (BID) | ORAL | Status: DC
  Administered 2016-02-22 – 2016-02-25 (×7): 250 mg via ORAL
  Filled 2016-02-22 (×7): qty 1

## 2016-02-22 MED ORDER — HEPARIN SODIUM (PORCINE) 5000 UNIT/ML IJ SOLN: 5000.0000 [IU] | Freq: Three times a day (TID) | INTRAMUSCULAR | Status: DC

## 2016-02-22 MED ORDER — FERROUS SULFATE 325 (65 FE) MG PO TABS
325.0000 mg | ORAL_TABLET | Freq: Every day | ORAL | Status: DC
  Administered 2016-02-22 – 2016-02-25 (×4): 325 mg via ORAL
  Filled 2016-02-22 (×4): qty 1

## 2016-02-22 MED ORDER — SODIUM CHLORIDE 0.9% FLUSH: 3.0000 mL | INTRAVENOUS | Status: DC | PRN

## 2016-02-22 MED ORDER — HYDROCODONE-ACETAMINOPHEN 5-325 MG PO TABS
1.0000 | ORAL_TABLET | ORAL | Status: DC | PRN
  Administered 2016-02-22 – 2016-02-25 (×13): 2 via ORAL
  Filled 2016-02-22 (×13): qty 2

## 2016-02-22 MED ORDER — ONDANSETRON HCL 4 MG PO TABS
4.0000 mg | ORAL_TABLET | Freq: Four times a day (QID) | ORAL | Status: DC | PRN
  Administered 2016-02-22: 4 mg via ORAL

## 2016-02-22 MED ORDER — PREGABALIN 50 MG PO CAPS
50.0000 mg | ORAL_CAPSULE | Freq: Every day | ORAL | Status: DC
  Administered 2016-02-22 – 2016-02-25 (×4): 50 mg via ORAL
  Filled 2016-02-22 (×4): qty 1

## 2016-02-22 MED ORDER — ONDANSETRON HCL 4 MG/2ML IJ SOLN
4.0000 mg | Freq: Four times a day (QID) | INTRAMUSCULAR | Status: DC | PRN
  Administered 2016-02-22 – 2016-02-25 (×4): 4 mg via INTRAVENOUS
  Filled 2016-02-22 (×7): qty 2

## 2016-02-22 NOTE — Progress Notes (Signed)
RT NOTE:  Report given to RockwallAshley, RT. Pt will be transferred by RN.

## 2016-02-22 NOTE — ED Notes (Signed)
Report attempted, RN unable to get report at this time. 

## 2016-02-22 NOTE — Consult Note (Signed)
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  It [facility] in conjunction with the patient's size and neck folds may make it difficult to avoid medical device related pressure.  The interdisciplinary team (including Respiratory Therapy) are making this patient's care a priority.  I will add our house antimicrobial textile as a wound contact layer and have collaborated with the bedside RN to devise a treatment intervention that will top that wound contact layer with a trach sponge  for absorption of secretions. It is noted that odor may be coming from patient's mouth cavity, as the oral hygiene is poor-even after RN performing oral care.  WOC nursing team will not follow, but will remain available to this patient, the nursing and medical teams.  Please re-consult if needed. Thanks, Ladona MowLaurie Brant Peets, MSN, RN, GNP, Hans EdenCWOCN, CWON-AP, FAAN  Pager# [phone]

## 2016-02-22 NOTE — H&P (Signed)
History and Physical    Yesenia [name] HQI:[id number] DOB: 09-17-1960 DOA: 02/21/2016  PCP: Egbert GaribaldiMillsaps, KIMBERLY M, NP   Patient coming from: Nursing Home  Chief Complaint: Throat pain, dyspnea, chills   HPI: Yesenia [name] is a 55 y.o. female with medical history significant for fibromyalgia, CKD stage III, and severe OSA with CPAP intolerance who underwent elective tracheostomy placement on 02/18/2016 and now presents from her nursing home for evaluation of subjective fevers, chills, throat pain, dyspnea, and chest pain. Patient was discharged from the hospital on 05/31/2016 after elective tracheostomy placement. There was apparently some erythema and foul odor at the trach site and she was discharged with prescription for Keflex. Patient did not fill the prescription right away and is not yet started the medication. Since the discharge, she reports development of subjective fevers and chills, as well as increasing pain and foul odor at the trach site. Today, she had increasing difficulty catching her breath and felt as though the trach was clogged. She felt as though she was struggling to breathe and then experienced some fleeting anterior chest pains. Chest pain was described as sharp, nonradiating, moderate in intensity, and with no alleviating or exacerbating factors identified. It was not associated diaphoresis or nausea. There has been no significant cough and no increased lower extremity edema. She was brought into the emergency room for further evaluation of her complaints.  ED Course: Upon arrival to the ED, patient is found to be afebrile, saturating 99% on  5 L per minute of supplemental oxygen, and with vitals otherwise stable. EKG demonstrates sinus rhythm and chest x-ray is notable for stable bibasilar scarring/atelectasis. CMP features a serum creatinine 1.13 which appears stable relative to her baseline. CBC is notable for a new leukocytosis to 11,800 and a new thrombocytosis to 404,000.  Lactic acid is reassuring at 1.13 and troponin is 0.01. Patient was evaluated by respiratory therapy and the tracheostomy was found to be patent. She received trach care by RT and was given a breathing treatment. Blood cultures were obtained, Dilaudid administered for pain, and empiric clindamycin given for suspected cellulitis. Patient had improvement in her subjective dyspnea in the ED and there was no recurrence in the chest pain. She remained hemodynamically stable and will be observed on telemetry unit for ongoing evaluation and management of trach site cellulitis.   Review of Systems:  All other systems reviewed and apart from HPI, are negative.  Past Medical History:  Diagnosis Date  . Anemia   . Anemia   . Chronic pain   . CKD (chronic kidney disease)   . Diabetes mellitus without complication (HCC)    diet controlled. does not check CBG's  . Fibromyalgia    hospitilized 12/16 due to inability to walk  . Hyperlipemia   . Hypertension   . Insomnia   . Neuropathy (HCC)   . Right sided weakness   . Stroke [facility](HCC) 2014?   no residual weakness   . Vitamin deficiency    Vit D    Past Surgical History:  Procedure Laterality Date  . KNEE SURGERY  1992, 1995, 1997  . LEG SURGERY Right 2007   Surgery x3 2 arthroscopies and one rod  . OOPHORECTOMY Right 1993?  . TRACHEOSTOMY TUBE PLACEMENT N/A 02/13/2016   Procedure: TRACHEOSTOMY;  Surgeon: Christia Readingwight Bates, MD;  [facility];  Service: ENT;  Laterality: N/A;     reports that she has never smoked. She has never used smokeless tobacco. She reports that she does not  drink alcohol or use drugs.  Allergies  Allergen Reactions  . Buprenorphine Hcl Hives  . Morphine And Related Hives and Dermatitis    Family History  Problem Relation Age of Onset  . Breast cancer Mother   . Leukemia Brother   . Breast cancer Maternal Grandmother      Prior to Admission medications   Medication Sig Start Date End Date Taking? Authorizing  Provider  albuterol (PROVENTIL HFA;VENTOLIN HFA) 108 (90 BASE) MCG/ACT inhaler Inhale 2 puffs into the lungs every 6 (six) hours as needed for wheezing.   Yes Historical Provider, MD  amLODipine (NORVASC) 10 MG tablet Take 10 mg by mouth every morning.    Yes Historical Provider, MD  atenolol (TENORMIN) 50 MG tablet Take 50 mg by mouth daily. 11/29/14  Yes Historical Provider, MD  bacitracin ointment Apply 1 application topically 2 (two) times daily. To trach site 02/20/16  Yes Christia Reading, MD  ferrous sulfate 325 (65 FE) MG tablet Take 325 mg by mouth daily. 09/13/14  Yes Historical Provider, MD  gabapentin (NEURONTIN) 300 MG capsule Take 300 mg by mouth daily as needed. For pain 10/29/15  Yes Historical Provider, MD  hydrochlorothiazide (HYDRODIURIL) 25 MG tablet Take 25 mg by mouth daily. 09/13/14  Yes Historical Provider, MD  ondansetron (ZOFRAN) 4 MG tablet Take 4 mg by mouth daily. Patient states she is taking every day per patient   Yes Historical Provider, MD  pantoprazole (PROTONIX) 40 MG tablet Take 1 tablet (40 mg total) by mouth daily. 02/20/16  Yes Christia Reading, MD  pregabalin (LYRICA) 50 MG capsule Take 50 mg by mouth daily.   Yes Historical Provider, MD  saccharomyces boulardii (FLORASTOR) 250 MG capsule Take 250 mg by mouth 2 (two) times daily.   Yes Historical Provider, MD  traZODone (DESYREL) 150 MG tablet Take 300 mg by mouth daily. 01/10/16  Yes Historical Provider, MD  cephALEXin (KEFLEX) 500 MG capsule Take 1 capsule (500 mg total) by mouth every 8 (eight) hours. Patient not taking: Reported on 02/21/2016 02/20/16   Christia Reading, MD    Physical Exam: Vitals:   02/21/16 2133 02/21/16 2200 02/21/16 2215 02/21/16 2227  BP: 125/61 (!) 89/65 98/69 116/71  Pulse: 83 77 77 75  Resp: Temp:      TempSrc:      SpO2: 91% 96% 95% 96%  Weight:      Height:          Constitutional: NAD, calm, obese, chronically-ill appearing Eyes: PERTLA, lids and conjunctivae normal ENMT:  Mucous membranes are moist. Posterior pharynx clear of any exudate or lesions.   Neck: Erythema, maceration, and foul odor surrounds trach site with weeping of serous fluid; no mass  Respiratory: clear to auscultation bilaterally, no wheezing, no crackles. Normal respiratory effort.   Cardiovascular: S1 & S2 heard, regular rate and rhythm. No extremity edema. No significant JVD. Abdomen: No distension, no tenderness, no masses palpated. Bowel sounds normal.  Musculoskeletal: no clubbing / cyanosis. No joint deformity upper and lower extremities. Normal muscle tone.  Skin: See trach site findings above. Otherwise, no significant rashes, lesions, ulcers. Warm, dry, well-perfused. Neurologic: CN 2-12 grossly intact. Sensation intact, DTR normal. Strength 5/5 in all 4 limbs.  Psychiatric: Normal judgment and insight. Alert and oriented x 3. Normal mood and affect.     Labs on Admission: I have personally reviewed following labs and imaging studies  CBC:  Recent Labs Lab 02/16/16 1232 02/19/16 0545 02/21/16  1952  WBC 14.5* 9.9 11.8*  NEUTROABS  --   --  6.0  HGB 10.8* 9.7* 12.1  HCT 34.5* 31.7* 36.9  MCV 89.6 89.5 87.2  PLT 309 331 404*   Basic Metabolic Panel:  Recent Labs Lab 02/16/16 1232 02/21/16 1952  NA 140 137  K 3.8 4.4  CL 105 100*  CO2 27 29  GLUCOSE 136* 107*  BUN 8 9  CREATININE 1.03* 1.13*  CALCIUM 9.0 8.9   GFR: Estimated Creatinine Clearance: 85.2 mL/min (by C-G formula based on SCr of 1.13 mg/dL). Liver Function Tests:  Recent Labs Lab 02/16/16 1232 02/21/16 1952  AST 27 29  ALT 31 33  ALKPHOS 83 101  BILITOT 0.7 1.0  PROT 6.6 7.2  ALBUMIN 3.0* 3.0*    Recent Labs Lab 02/16/16 1232  LIPASE 19  AMYLASE 40   No results for input(s): AMMONIA in the last 168 hours. Coagulation Profile: No results for input(s): INR, PROTIME in the last 168 hours. Cardiac Enzymes: No results for input(s): CKTOTAL, CKMB, CKMBINDEX, TROPONINI in the last 168  hours. BNP (last 3 results) No results for input(s): PROBNP in the last 8760 hours. HbA1C: No results for input(s): HGBA1C in the last 72 hours. CBG:  Recent Labs Lab 02/19/16 1159 02/19/16 1704 02/19/16 2229 02/20/16 0737 02/20/16 1115  GLUCAP 112* 102* 104* 131* 128*   Lipid Profile: No results for input(s): CHOL, HDL, LDLCALC, TRIG, CHOLHDL, LDLDIRECT in the last 72 hours. Thyroid Function Tests: No results for input(s): TSH, T4TOTAL, FREET4, T3FREE, THYROIDAB in the last 72 hours. Anemia Panel: No results for input(s): VITAMINB12, FOLATE, FERRITIN, TIBC, IRON, RETICCTPCT in the last 72 hours. Urine analysis:    Component Value Date/Time   COLORURINE YELLOW 02/16/2016 1310   APPEARANCEUR HAZY (A) 02/16/2016 1310   LABSPEC 1.015 02/16/2016 1310   PHURINE 5.5 02/16/2016 1310   GLUCOSEU NEGATIVE 02/16/2016 1310   HGBUR NEGATIVE 02/16/2016 1310   BILIRUBINUR NEGATIVE 02/16/2016 1310   KETONESUR NEGATIVE 02/16/2016 1310   PROTEINUR NEGATIVE 02/16/2016 1310   UROBILINOGEN 0.2 06/16/2014 0955   NITRITE NEGATIVE 02/16/2016 1310   LEUKOCYTESUR NEGATIVE 02/16/2016 1310   Sepsis Labs: @LABRCNTIP (procalcitonin:4,lacticidven:4) ) Recent Results (from the past 240 hour(s))  MRSA PCR Screening     Status: None   Collection Time: 02/13/16  4:40 PM  Result Value Ref Range Status   MRSA by PCR NEGATIVE NEGATIVE Final    Comment:        The GeneXpert MRSA Assay (FDA approved for NASAL specimens only), is one component of a comprehensive MRSA colonization surveillance program. It is not intended to diagnose MRSA infection nor to guide or monitor treatment for MRSA infections.      Radiological Exams on Admission: Dg Chest Port 1 View  Result Date: 02/21/2016 CLINICAL DATA:  Chest pain EXAM: PORTABLE CHEST 1 VIEW COMPARISON:  02/16/2016 FINDINGS: Cardiac shadow remains enlarged. Tracheostomy tube is noted in satisfactory position. The lungs are well aerated. Stable  bibasilar changes are again seen. No new focal infiltrate is noted. IMPRESSION: Stable bibasilar atelectasis/scarring. Electronically Signed   By: Alcide Clever M.D.   On: 02/21/2016 20:01    EKG: Independently reviewed. Sinus rhythm, no acute ST-T changes  Assessment/Plan  1. Cellulitis  - Pt reports subjective fevers day prior to admission; has leukocytosis here  - Was prescribed a course of Keflex at time of recent hospital discharge, but had not yet started  - Blood culture incubating; lactate reassuring at 1.13  -  Empiric treatment with Ancef 2g q8h   - Trend wbc and clinical progress, convert tx to PO when improving; expand coverage if continues to worsen   2. Chest pain   - Had fleeting CP associated with dyspnea, resolved PTA - Initial EKG and troponin are reassuring  - Will monitor on telemetry overnight and check serial troponins   3. OSA  - Severe, resistant to CPAP  - Pt just underwent elective trach d/t this   4. Hypertension - Running low at times in ED  - Managed with Norvasc, Atenolol, and HCTZ at home  - Hold antihypertensives at time of admission d/t soft BP; resume as appropriate   5. Fibromyalgia  - Stable - Continue current management with Lyrica    DVT prophylaxis: sq heparin  Code Status: Full  Family Communication: Discussed with patient Disposition Plan: Observe on telemetry  Consults called: None Admission status: Observation    Briscoe Deutscher, MD [facility] Pager [phone]  If 7PM-7AM, please contact night-coverage [email or web address] Password [email or web address]  02/22/2016, 12:01 AM

## 2016-02-22 NOTE — Progress Notes (Signed)
PROGRESS NOTE    Yesenia [name]  ZOX:[id number] DOB: 1961-04-07 DOA: 02/21/2016 PCP: Egbert Garibaldi, NP   Chief Complaint  Patient presents with  . Shortness of Breath     Trach pt  . Chest Pain    Brief Narrative:  HPI on 02/22/2016 by Dr. Marcial Pacas Opyd Yesenia [name] is a 55 y.o. female with medical history significant for fibromyalgia, CKD stage III, and severe OSA with CPAP intolerance who underwent elective tracheostomy placement on 02/18/2016 and now presents from her nursing home for evaluation of subjective fevers, chills, throat pain, dyspnea, and chest pain. Patient was discharged from the hospital on 05/31/2016 after elective tracheostomy placement. There was apparently some erythema and foul odor at the trach site and she was discharged with prescription for Keflex. Patient did not fill the prescription right away and is not yet started the medication. Since the discharge, she reports development of subjective fevers and chills, as well as increasing pain and foul odor at the trach site. Today, she had increasing difficulty catching her breath and felt as though the trach was clogged. She felt as though she was struggling to breathe and then experienced some fleeting anterior chest pains. Chest pain was described as sharp, nonradiating, moderate in intensity, and with no alleviating or exacerbating factors identified. It was not associated diaphoresis or nausea. There has been no significant cough and no increased lower extremity edema. She was brought into the emergency room for further evaluation of her complaints.  Assessment & Plan   Cellulitis  -Patient reported subjective fevers day prior to admission; has leukocytosis here  -Was prescribed a course of Keflex at time of recent hospital discharge, but had not yet started  -blood cultures pending -Continue Ancef -Wound care consulted and appreciated  Chest pain   -Had fleeting CP associated with dyspnea, resolved  PTA -Initial EKG and troponin are reassuring  -Will obtain troponin  OSA  -Severe, resistant to CPAP  -s/p elective trach   Essential Hypertension -Running low at times in ED  -Managed with Norvasc, Atenolol, and HCTZ at home  -Hold antihypertensives at time of admission d/t soft BP; resume as appropriate   Fibromyalgia  -Stable, continue lyrica  Chronic normocytic anemia -Hemoglobin currently 10.4, appears to be close to baseline -Continue to monitor CBC  DVT Prophylaxis  heparin  Code Status: Full  Family Communication: None at bedside  Disposition Plan: Admitted for observation  Consultants None  Procedures  None  Antibiotics   Anti-infectives    Start     Dose/Rate Route Frequency Ordered Stop   02/22/16 0030  ceFAZolin (ANCEF) IVPB 2g/100 mL premix     2 g 200 mL/hr over 30 Minutes Intravenous Every 8 hours 02/22/16 0001     02/21/16 2300  clindamycin (CLEOCIN) IVPB 600 mg  Status:  Discontinued     600 mg 100 mL/hr over 30 Minutes Intravenous  Once 02/21/16 2251 02/22/16 0001      Subjective:   Yesenia [name] seen and examined today.  Patient would like to be suctioned, otherwise has no complaints. Denies chest pain, shortness of breath, abdominal pain.  Objective:   Vitals:   02/22/16 0355 02/22/16 0526 02/22/16 0700 02/22/16 0843  BP:  (!) 113/47  101/61  Pulse:  72  (!) 54  Resp:  16  20  Temp:  100 F (37.8 C)  98.8 F (37.1 C)  TempSrc:  Oral  Oral  SpO2: 96% 94% 93% 93%  Weight:  Height:        Intake/Output Summary (Last 24 hours) at 02/22/16 1305 Last data filed at 02/22/16 0529  Gross per 24 hour  Intake           278.75 ml  Output              100 ml  Net           178.75 ml   Filed Weights   02/21/16 1933 02/22/16 0210  Weight: (!) 144.7 kg (319 lb) (!) 141.4 kg (311 lb 11.2 oz)    Exam  General: Well developed, chronically ill appearing  HEENT: NCAT, mucous membranes moist.   Neck: Trach site- erythema, small  wound with serous fluid, Foul odor  Cardiovascular: S1 S2 auscultated, no rubs, murmurs or gallops. Regular rate and rhythm.  Respiratory: Clear to auscultation bilaterally with equal chest rise  Abdomen: Soft, obese, nontender, nondistended, + bowel sounds  Extremities: warm dry without cyanosis clubbing or edema  Neuro: AAOx3, nonfocal  Skin: Erythema around trach site  Psych: Normal affect and demeanor with intact judgement and insight   Data Reviewed: I have personally reviewed following labs and imaging studies  CBC:  Recent Labs Lab 02/16/16 1232 02/19/16 0545 02/21/16 1952 02/22/16 0444 02/22/16 0928  WBC 14.5* 9.9 11.8*  --   --   NEUTROABS  --   --  6.0  --   --   HGB 10.8* 9.7* 12.1 10.4* 10.4*  HCT 34.5* 31.7* 36.9 33.3* 32.4*  MCV 89.6 89.5 87.2  --   --   PLT 309 331 404*  --   --    Basic Metabolic Panel:  Recent Labs Lab 02/16/16 1232 02/21/16 1952 02/22/16 0102  NA 140 137 139  K 3.8 4.4 3.6  CL 105 100* 97*  CO2 GLUCOSE 136* 107* 138*  BUN CREATININE 1.03* 1.13* 1.54*  CALCIUM 9.0 8.9 9.1   GFR: Estimated Creatinine Clearance: 60.7 mL/min (by C-G formula based on SCr of 1.54 mg/dL). Liver Function Tests:  Recent Labs Lab 02/16/16 1232 02/21/16 1952  AST 27 29  ALT 31 33  ALKPHOS 83 101  BILITOT 0.7 1.0  PROT 6.6 7.2  ALBUMIN 3.0* 3.0*    Recent Labs Lab 02/16/16 1232  LIPASE 19  AMYLASE 40   No results for input(s): AMMONIA in the last 168 hours. Coagulation Profile: No results for input(s): INR, PROTIME in the last 168 hours. Cardiac Enzymes: No results for input(s): CKTOTAL, CKMB, CKMBINDEX, TROPONINI in the last 168 hours. BNP (last 3 results) No results for input(s): PROBNP in the last 8760 hours. HbA1C: No results for input(s): HGBA1C in the last 72 hours. CBG:  Recent Labs Lab 02/19/16 1704 02/19/16 2229 02/20/16 0737 02/20/16 1115 02/22/16 0222  GLUCAP 102* 104* 131* 128* 124*    Lipid Profile: No results for input(s): CHOL, HDL, LDLCALC, TRIG, CHOLHDL, LDLDIRECT in the last 72 hours. Thyroid Function Tests: No results for input(s): TSH, T4TOTAL, FREET4, T3FREE, THYROIDAB in the last 72 hours. Anemia Panel: No results for input(s): VITAMINB12, FOLATE, FERRITIN, TIBC, IRON, RETICCTPCT in the last 72 hours. Urine analysis:    Component Value Date/Time   COLORURINE YELLOW 02/16/2016 1310   APPEARANCEUR HAZY (A) 02/16/2016 1310   LABSPEC 1.015 02/16/2016 1310   PHURINE 5.5 02/16/2016 1310   GLUCOSEU NEGATIVE 02/16/2016 1310   HGBUR NEGATIVE 02/16/2016 1310   BILIRUBINUR NEGATIVE 02/16/2016 1310   KETONESUR NEGATIVE 02/16/2016 1310  PROTEINUR NEGATIVE 02/16/2016 1310   UROBILINOGEN 0.2 06/16/2014 0955   NITRITE NEGATIVE 02/16/2016 1310   LEUKOCYTESUR NEGATIVE 02/16/2016 1310   Sepsis Labs: @LABRCNTIP (procalcitonin:4,lacticidven:4)  ) Recent Results (from the past 240 hour(s))  MRSA PCR Screening     Status: None   Collection Time: 02/13/16  4:40 PM  Result Value Ref Range Status   MRSA by PCR NEGATIVE NEGATIVE Final    Comment:        The GeneXpert MRSA Assay (FDA approved for NASAL specimens only), is one component of a comprehensive MRSA colonization surveillance program. It is not intended to diagnose MRSA infection nor to guide or monitor treatment for MRSA infections.   Culture, blood (Routine X 2) w Reflex to ID Panel     Status: None (Preliminary result)   Collection Time: 02/21/16  7:52 PM  Result Value Ref Range Status   Specimen Description BLOOD LEFT ANTECUBITAL  Final   Special Requests BOTTLES DRAWN AEROBIC AND ANAEROBIC 5CC  Final   Culture NO GROWTH < 24 HOURS  Final   Report Status PENDING  Incomplete  Culture, blood (single)     Status: None (Preliminary result)   Collection Time: 02/21/16 11:21 PM  Result Value Ref Range Status   Specimen Description BLOOD LEFT HAND  Final   Special Requests BOTTLES DRAWN AEROBIC AND  ANAEROBIC 5CC  Final   Culture NO GROWTH < 12 HOURS  Final   Report Status PENDING  Incomplete      Radiology Studies: Dg Chest Port 1 View  Result Date: 02/21/2016 CLINICAL DATA:  Chest pain EXAM: PORTABLE CHEST 1 VIEW COMPARISON:  02/16/2016 FINDINGS: Cardiac shadow remains enlarged. Tracheostomy tube is noted in satisfactory position. The lungs are well aerated. Stable bibasilar changes are again seen. No new focal infiltrate is noted. IMPRESSION: Stable bibasilar atelectasis/scarring. Electronically Signed   By: Alcide CleverMark  Lukens M.D.   On: 02/21/2016 20:01     Scheduled Meds: .  ceFAZolin (ANCEF) IV  2 g Intravenous Q8H  . ferrous sulfate  325 mg Oral Daily  . iopamidol      . iopamidol      . pantoprazole  40 mg Oral Daily  . pregabalin  50 mg Oral Daily  . saccharomyces boulardii  250 mg Oral BID  . sodium chloride flush  3 mL Intravenous Q12H  . sodium chloride flush  3 mL Intravenous Q12H  . traZODone  300 mg Oral QHS   Continuous Infusions:    LOS: 0 days   Time Spent in minutes   30 minutes  Yesenia [name] D.O. on 02/22/2016 at 1:05 PM  Between 7am to 7pm - Pager - [phone]  After 7pm go to [email or web address] - password [email or web address]  And look for the night coverage person covering for me after hours  [facility] Office  [phone]

## 2016-02-22 NOTE — Progress Notes (Signed)
Patient transported by ED NT on 5L O2 per nasal canula to Rm 6E27.

## 2016-02-23 DIAGNOSIS — G4730 Sleep apnea, unspecified: Secondary | ICD-10-CM

## 2016-02-23 DIAGNOSIS — M797 Fibromyalgia: Secondary | ICD-10-CM

## 2016-02-23 DIAGNOSIS — I10 Essential (primary) hypertension: Secondary | ICD-10-CM

## 2016-02-23 DIAGNOSIS — R079 Chest pain, unspecified: Secondary | ICD-10-CM

## 2016-02-23 LAB — BASIC METABOLIC PANEL
ANION GAP: 9 (ref 5–15)
BUN: 16 mg/dL (ref 6–20)
CO2: 29 mmol/L (ref 22–32)
Calcium: 8.8 mg/dL — ABNORMAL LOW (ref 8.9–10.3)
Chloride: 101 mmol/L (ref 101–111)
Creatinine, Ser: 1.53 mg/dL — ABNORMAL HIGH (ref 0.44–1.00)
GFR calc Af Amer: 43 mL/min — ABNORMAL LOW (ref >60)
GFR calc non Af Amer: 37 mL/min — ABNORMAL LOW (ref >60)
GLUCOSE: 115 mg/dL — AB (ref 65–99)
POTASSIUM: 3.4 mmol/L — AB (ref 3.5–5.1)
Sodium: 139 mmol/L (ref 135–145)

## 2016-02-23 LAB — CBC
HEMATOCRIT: 31.8 % — AB (ref 36.0–46.0)
Hemoglobin: 9.8 g/dL — ABNORMAL LOW (ref 12.0–15.0)
MCH: 27.1 pg (ref 26.0–34.0)
MCHC: 30.8 g/dL (ref 30.0–36.0)
MCV: 88.1 fL (ref 78.0–100.0)
Platelets: 350 10*3/uL (ref 150–400)
RBC: 3.61 MIL/uL — AB (ref 3.87–5.11)
RDW: 14.1 % (ref 11.5–15.5)
WBC: 9.6 10*3/uL (ref 4.0–10.5)

## 2016-02-23 MED ORDER — POTASSIUM CHLORIDE CRYS ER 20 MEQ PO TBCR
40.0000 meq | EXTENDED_RELEASE_TABLET | Freq: Once | ORAL | Status: AC
  Administered 2016-02-23: 40 meq via ORAL
  Filled 2016-02-23: qty 2

## 2016-02-23 NOTE — Progress Notes (Addendum)
PROGRESS NOTE    Yesenia [name]  ZOX:[id number] DOB: 06-30-1961 DOA: 02/21/2016 PCP: Egbert GaribaldiMillsaps, KIMBERLY M, NP   Chief Complaint  Patient presents with  . Shortness of Breath     Trach pt  . Chest Pain    Brief Narrative:  HPI on 02/22/2016 by Dr. Marcial Pacasimothy Opyd Yesenia [name] is a 55 y.o. female with medical history significant for fibromyalgia, CKD stage III, and severe OSA with CPAP intolerance who underwent elective tracheostomy placement on 02/18/2016 and now presents from her nursing home for evaluation of subjective fevers, chills, throat pain, dyspnea, and chest pain. Patient was discharged from the hospital on 05/31/2016 after elective tracheostomy placement. There was apparently some erythema and foul odor at the trach site and she was discharged with prescription for Keflex. Patient did not fill the prescription right away and is not yet started the medication. Since the discharge, she reports development of subjective fevers and chills, as well as increasing pain and foul odor at the trach site. Today, she had increasing difficulty catching her breath and felt as though the trach was clogged. She felt as though she was struggling to breathe and then experienced some fleeting anterior chest pains. Chest pain was described as sharp, nonradiating, moderate in intensity, and with no alleviating or exacerbating factors identified. It was not associated diaphoresis or nausea. There has been no significant cough and no increased lower extremity edema. She was brought into the emergency room for further evaluation of her complaints.  Assessment & Plan   Cellulitis  -Patient reported subjective fevers day prior to admission; has leukocytosis here  -Was prescribed a course of Keflex at time of recent hospital discharge, but had not yet started  -blood cultures show no growth to date -Continue Ancef -Leukocytosis resolved -Wound care consulted and appreciated -CT neck/chest: Enlargement of  submandibular gland on the right consistent with a sialoadenitis. Tracheostomy in place  Chest pain   -Had fleeting CP associated with dyspnea, resolved PTA -Initial EKG and troponin are reassuring  -Troponin 0.03  OSA  -Severe, resistant to CPAP  -s/p elective trach   Essential Hypertension -Running low at times in ED  -Managed with Norvasc, Atenolol, and HCTZ at home  -BP currently stable, will continue to hold medications  Fibromyalgia  -Stable, continue lyrica  Chronic normocytic anemia -Hemoglobin currently 9.8, appears to be close to baseline -Continue to monitor CBC  DVT Prophylaxis  heparin  Code Status: Full  Family Communication: None at bedside  Disposition Plan: Admitted. Possible discharge back to SNF within 24-48 hours.  Consultants None  Procedures  None  Antibiotics   Anti-infectives    Start     Dose/Rate Route Frequency Ordered Stop   02/22/16 0030  ceFAZolin (ANCEF) IVPB 2g/100 mL premix     2 g 200 mL/hr over 30 Minutes Intravenous Every 8 hours 02/22/16 0001     02/21/16 2300  clindamycin (CLEOCIN) IVPB 600 mg  Status:  Discontinued     600 mg 100 mL/hr over 30 Minutes Intravenous  Once 02/21/16 2251 02/22/16 0001      Subjective:   Yesenia [name] seen and examined today.  Has no complaints this morning. Denies chest pain, shortness of breath, abdominal pain, nausea/vomiting.  Objective:   Vitals:   02/23/16 0607 02/23/16 0800 02/23/16 0950 02/23/16 1140  BP: 116/69  119/85   Pulse: 67 70 85 88  Resp: 18 18 18 18   Temp: 98.1 F (36.7 C)  98.6 F (37 C)   TempSrc:  Oral  Oral   SpO2: 95% 97% 99% 98%  Weight:      Height:        Intake/Output Summary (Last 24 hours) at 02/23/16 1223 Last data filed at 02/23/16 [id number]  Gross per 24 hour  Intake              562 ml  Output                0 ml  Net              562 ml   Filed Weights   02/21/16 1933 02/22/16 0210 02/22/16 2301  Weight: (!) 144.7 kg (319 lb) (!) 141.4 kg (311  lb 11.2 oz) (!) 141.3 kg (311 lb 8 oz)    Exam  General: Well developed, chronically ill appearing  HEENT: NCAT, mucous membranes moist.   Neck: Trach site- erythema improving  Cardiovascular: S1 S2 auscultated, RRR, no murmurs  Respiratory: Clear to auscultation bilaterally with equal chest rise  Abdomen: Soft, obese, nontender, nondistended, + bowel sounds  Extremities: warm dry without cyanosis clubbing or edema  Neuro: AAOx3, nonfocal  Skin: Erythema around trach site- improving   Psych: Normal affect and demeanor with intact judgement and insight   Data Reviewed: I have personally reviewed following labs and imaging studies  CBC:  Recent Labs Lab 02/16/16 1232 02/19/16 0545 02/21/16 1952 02/22/16 0444 02/22/16 0928 02/22/16 1623 02/22/16 2155 02/23/16 0449  WBC 14.5* 9.9 11.8*  --   --   --   --  9.6  NEUTROABS  --   --  6.0  --   --   --   --   --   HGB 10.8* 9.7* 12.1 10.4* 10.4* 10.6* 10.3* 9.8*  HCT 34.5* 31.7* 36.9 33.3* 32.4* 33.5* 32.1* 31.8*  MCV 89.6 89.5 87.2  --   --   --   --  88.1  PLT 309 331 404*  --   --   --   --  350   Basic Metabolic Panel:  Recent Labs Lab 02/16/16 1232 02/21/16 1952 02/22/16 0102 02/23/16 0449  NA 140 137 139 139  K 3.8 4.4 3.6 3.4*  CL 105 100* 97* 101  CO2 27 29 30 29   GLUCOSE 136* 107* 138* 115*  BUN 8 9 11 16   CREATININE 1.03* 1.13* 1.54* 1.53*  CALCIUM 9.0 8.9 9.1 8.8*   GFR: Estimated Creatinine Clearance: 61.1 mL/min (by C-G formula based on SCr of 1.53 mg/dL). Liver Function Tests:  Recent Labs Lab 02/16/16 1232 02/21/16 1952  AST 27 29  ALT 31 33  ALKPHOS 83 101  BILITOT 0.7 1.0  PROT 6.6 7.2  ALBUMIN 3.0* 3.0*    Recent Labs Lab 02/16/16 1232  LIPASE 19  AMYLASE 40   No results for input(s): AMMONIA in the last 168 hours. Coagulation Profile: No results for input(s): INR, PROTIME in the last 168 hours. Cardiac Enzymes:  Recent Labs Lab 02/22/16 1623  TROPONINI 0.03*    BNP (last 3 results) No results for input(s): PROBNP in the last 8760 hours. HbA1C: No results for input(s): HGBA1C in the last 72 hours. CBG:  Recent Labs Lab 02/19/16 1704 02/19/16 2229 02/20/16 0737 02/20/16 1115 02/22/16 0222  GLUCAP 102* 104* 131* 128* 124*   Lipid Profile: No results for input(s): CHOL, HDL, LDLCALC, TRIG, CHOLHDL, LDLDIRECT in the last 72 hours. Thyroid Function Tests: No results for input(s): TSH, T4TOTAL, FREET4, T3FREE, THYROIDAB in the last 72 hours.  Anemia Panel: No results for input(s): VITAMINB12, FOLATE, FERRITIN, TIBC, IRON, RETICCTPCT in the last 72 hours. Urine analysis:    Component Value Date/Time   COLORURINE YELLOW 02/16/2016 1310   APPEARANCEUR HAZY (A) 02/16/2016 1310   LABSPEC 1.015 02/16/2016 1310   PHURINE 5.5 02/16/2016 1310   GLUCOSEU NEGATIVE 02/16/2016 1310   HGBUR NEGATIVE 02/16/2016 1310   BILIRUBINUR NEGATIVE 02/16/2016 1310   KETONESUR NEGATIVE 02/16/2016 1310   PROTEINUR NEGATIVE 02/16/2016 1310   UROBILINOGEN 0.2 06/16/2014 0955   NITRITE NEGATIVE 02/16/2016 1310   LEUKOCYTESUR NEGATIVE 02/16/2016 1310   Sepsis Labs: @LABRCNTIP (procalcitonin:4,lacticidven:4)  ) Recent Results (from the past 240 hour(s))  MRSA PCR Screening     Status: None   Collection Time: 02/13/16  4:40 PM  Result Value Ref Range Status   MRSA by PCR NEGATIVE NEGATIVE Final    Comment:        The GeneXpert MRSA Assay (FDA approved for NASAL specimens only), is one component of a comprehensive MRSA colonization surveillance program. It is not intended to diagnose MRSA infection nor to guide or monitor treatment for MRSA infections.   Culture, blood (Routine X 2) w Reflex to ID Panel     Status: None (Preliminary result)   Collection Time: 02/21/16  7:52 PM  Result Value Ref Range Status   Specimen Description BLOOD LEFT ANTECUBITAL  Final   Special Requests BOTTLES DRAWN AEROBIC AND ANAEROBIC 5CC  Final   Culture NO GROWTH < 24  HOURS  Final   Report Status PENDING  Incomplete  Culture, blood (single)     Status: None (Preliminary result)   Collection Time: 02/21/16 11:21 PM  Result Value Ref Range Status   Specimen Description BLOOD LEFT HAND  Final   Special Requests BOTTLES DRAWN AEROBIC AND ANAEROBIC 5CC  Final   Culture NO GROWTH < 12 HOURS  Final   Report Status PENDING  Incomplete      Radiology Studies: Ct Soft Tissue Neck W Contrast  Result Date: 02/22/2016 CLINICAL DATA:  Tracheostomy. Hemoptysis. Tracheostomy placed 1 week ago. Fevers and chills. EXAM: CT NECK WITH CONTRAST TECHNIQUE: Multidetector CT imaging of the neck was performed using the standard protocol following the bolus administration of intravenous contrast. CONTRAST:  75mL ISOVUE-300 IOPAMIDOL (ISOVUE-300) INJECTION 61% COMPARISON:  11/26/2015 FINDINGS: Pharynx and larynx: No mucosal or submucosal lesion visible. Possible vocal cord paresis on the right. Tracheostomy appears well positioned without evidence of placement site complication. Tracheostomy does not have extensive purchase into the trachea and 1 might consider a longer tube in the future. Salivary glands: Parotid glands are normal. Left submandibular gland is normal. The patient has developed enlargement and indistinctness of the submandibular gland on the right with areas of internal low density and a few microcalcifications. 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 This gland appeared normal 3 months ago. Thyroid: Normal Lymph nodes: No enlarged or low-density nodes identified. Vascular: Negative Limited intracranial: Negative Visualized orbits: Normal Mastoids and visualized paranasal sinuses: Clear Skeleton: No significant finding IMPRESSION: Development of enlargement, indistinctness and low-density affecting the submandibular gland on the right consistent with sialoadenitis. Tracheostomy in place. Note that the tracheostomy does have its tip within  the trachea, but only by a cm or so. Consider use of a a longer to in the future. Electronically Signed   By: Paulina Fusi M.D.   On: 02/22/2016 20:08   Ct Chest W Contrast  Result Date: 02/22/2016 CLINICAL DATA:  55 year old female with hemoptysis.  Tracheostomy placement 4 days ago. Fever, throat pain, shortness of breath and chest pain. Initial encounter. EXAM: CT CHEST WITH CONTRAST TECHNIQUE: Multidetector CT imaging of the chest was performed during intravenous contrast administration. CONTRAST:  75mL ISOVUE-300 IOPAMIDOL (ISOVUE-300) INJECTION 61% COMPARISON:  Neck CT from today reported separately. Portable chest radiographs 02/21/2016 and earlier. CT Abdomen and Pelvis 04/11/2013. FINDINGS: Tracheostomy tube appears satisfactorily placed. See also neck CT from today reported separately. Large body habitus. Major airways are patent. Mild bilateral lower lobe atelectasis. Mild superimposed lung based mosaic attenuation favored to reflect gas trapping. No pleural effusion. Cardiomegaly appears stable since 2014. No pericardial effusion. No thoracic lymphadenopathy. Thoracic aorta and central pulmonary artery is are normally enhancing. Negative visualized liver, spleen, pancreas, adrenal glands, left kidney, and bowel in the upper abdomen. No acute osseous abnormality identified. IMPRESSION: 1. Tracheostomy tube appears satisfactorily placed. See also neck CT from today reported separately. 2. No acute pulmonary findings other than mild atelectasis in gas trapping at the lung bases. 3. Stable cardiomegaly. Electronically Signed   By: Odessa Fleming M.D.   On: 02/22/2016 20:05   Dg Chest Port 1 View  Result Date: 02/21/2016 CLINICAL DATA:  Chest pain EXAM: PORTABLE CHEST 1 VIEW COMPARISON:  02/16/2016 FINDINGS: Cardiac shadow remains enlarged. Tracheostomy tube is noted in satisfactory position. The lungs are well aerated. Stable bibasilar changes are again seen. No new focal infiltrate is noted. IMPRESSION:  Stable bibasilar atelectasis/scarring. Electronically Signed   By: Alcide Clever M.D.   On: 02/21/2016 20:01     Scheduled Meds: .  ceFAZolin (ANCEF) IV  2 g Intravenous Q8H  . ferrous sulfate  325 mg Oral Daily  . pantoprazole  40 mg Oral Daily  . pregabalin  50 mg Oral Daily  . saccharomyces boulardii  250 mg Oral BID  . sodium chloride flush  3 mL Intravenous Q12H  . sodium chloride flush  3 mL Intravenous Q12H  . traZODone  300 mg Oral QHS   Continuous Infusions:    LOS: 1 day   Time Spent in minutes   30 minutes  Carver Murakami D.O. on 02/23/2016 at 12:23 PM  Between 7am to 7pm - Pager - [phone]  After 7pm go to [email or web address] - password [email or web address]  And look for the night coverage person covering for me after hours  [facility] Office  [phone]

## 2016-02-24 LAB — BASIC METABOLIC PANEL
Anion gap: 9 (ref 5–15)
BUN: 11 mg/dL (ref 6–20)
CALCIUM: 8.8 mg/dL — AB (ref 8.9–10.3)
CO2: 27 mmol/L (ref 22–32)
CREATININE: 1.17 mg/dL — AB (ref 0.44–1.00)
Chloride: 104 mmol/L (ref 101–111)
GFR calc Af Amer: >60 mL/min (ref >60)
GFR, EST NON AFRICAN AMERICAN: 52 mL/min — AB (ref >60)
GLUCOSE: 105 mg/dL — AB (ref 65–99)
Potassium: 3.5 mmol/L (ref 3.5–5.1)
Sodium: 140 mmol/L (ref 135–145)

## 2016-02-24 LAB — CBC
HEMATOCRIT: 32.8 % — AB (ref 36.0–46.0)
Hemoglobin: 10.2 g/dL — ABNORMAL LOW (ref 12.0–15.0)
MCH: 27.6 pg (ref 26.0–34.0)
MCHC: 31.1 g/dL (ref 30.0–36.0)
MCV: 88.9 fL (ref 78.0–100.0)
PLATELETS: 350 10*3/uL (ref 150–400)
RBC: 3.69 MIL/uL — ABNORMAL LOW (ref 3.87–5.11)
RDW: 14.1 % (ref 11.5–15.5)
WBC: 7.7 10*3/uL (ref 4.0–10.5)

## 2016-02-24 NOTE — Progress Notes (Addendum)
PROGRESS NOTE    Yesenia [name]  ZOX:[id number] DOB: 19-Nov-1960 DOA: 02/21/2016 PCP: Egbert GaribaldiMillsaps, KIMBERLY M, NP   Chief Complaint  Patient presents with  . Shortness of Breath     Trach pt  . Chest Pain    Brief Narrative:  HPI on 02/22/2016 by Dr. Marcial Pacasimothy Opyd Yesenia [name] is a 55 y.o. female with medical history significant for fibromyalgia, CKD [name] III, and severe OSA with CPAP intolerance who underwent elective tracheostomy placement on 02/18/2016 and now presents from her nursing home for evaluation of subjective fevers, chills, throat pain, dyspnea, and chest pain. Patient was discharged from the hospital on 05/31/2016 after elective tracheostomy placement. There was apparently some erythema and foul odor at the trach site and she was discharged with prescription for Keflex. Patient did not fill the prescription right away and is not yet started the medication. Since the discharge, she reports development of subjective fevers and chills, as well as increasing pain and foul odor at the trach site. Today, she had increasing difficulty catching her breath and felt as though the trach was clogged. She felt as though she was struggling to breathe and then experienced some fleeting anterior chest pains. Chest pain was described as sharp, nonradiating, moderate in intensity, and with no alleviating or exacerbating factors identified. It was not associated diaphoresis or nausea. There has been no significant cough and no increased lower extremity edema. She was brought into the emergency room for further evaluation of her complaints.  Assessment & Plan   Cellulitis -Patient reported subjective fevers day prior to admission; has leukocytosis here  -Was prescribed a course of Keflex at time of recent hospital discharge, but had not yet started  -blood cultures show no growth to date -Continue Ancef -Leukocytosis resolved -Wound care consulted and appreciated- -CT neck/chest: Enlargement of  submandibular gland on the right consistent with a sialoadenitis. Tracheostomy in place  Chest pain   -Had fleeting CP associated with dyspnea, resolved PTA -Initial EKG and troponin are reassuring  -Troponin 0.03  OSA  -Severe, resistant to CPAP  -s/p elective trach  -Called Dr. Jenne PaneBates for follow up  Essential Hypertension -Running low at times in ED  -Managed with Norvasc, Atenolol, and HCTZ at home  -BP currently stable, will continue to hold medications  Fibromyalgia  -Stable, continue lyrica  Chronic normocytic anemia -Hemoglobin currently 10.2, appears to be close to baseline -Continue to monitor CBC  Chronic kidney disease, [name] III -Creatinine was elevated to 1.53, however GFR still in [name] III -Creatinine currently 1.17 -Continue to monitor BMP  [name] III wound -Secondary to trach?  -present upon admission -Wound care consulted and appreciated. Continue wound care and trach sponge for absorption of secretions.   DVT Prophylaxis  heparin  Code Status: Full  Family Communication: None at bedside  Disposition Plan: Admitted. Possible discharge back to SNF within 24-48 hours.  Consultants None  Procedures  None  Antibiotics   Anti-infectives    Start     Dose/Rate Route Frequency Ordered Stop   02/22/16 0030  ceFAZolin (ANCEF) IVPB 2g/100 mL premix     2 g 200 mL/hr over 30 Minutes Intravenous Every 8 hours 02/22/16 0001     02/21/16 2300  clindamycin (CLEOCIN) IVPB 600 mg  Status:  Discontinued     600 mg 100 mL/hr over 30 Minutes Intravenous  Once 02/21/16 2251 02/22/16 0001      Subjective:   Yesenia [name] seen and examined today.  Has no complaints this morning.  Denies chest pain, shortness of breath, abdominal pain, nausea/vomiting.  Objective:   Vitals:   02/24/16 0356 02/24/16 0445 02/24/16 0904 02/24/16 0915  BP:  113/67 (!) 100/58   Pulse: 86 81 75 88  Resp: 18 19 18 18   Temp:  98.1 F (36.7 C) 98.5 F (36.9 C)   TempSrc:   Oral Oral   SpO2: 94% 97% 98% 96%  Weight:      Height:        Intake/Output Summary (Last 24 hours) at 02/24/16 1148 Last data filed at 02/24/16 0905  Gross per 24 hour  Intake             1180 ml  Output                0 ml  Net             1180 ml   Filed Weights   02/22/16 0210 02/22/16 2301 02/23/16 2113  Weight: (!) 141.4 kg (311 lb 11.2 oz) (!) 141.3 kg (311 lb 8 oz) (!) 144.8 kg (319 lb 3.6 oz)    Exam  General: Well developed, chronically ill appearing, no distress  HEENT: NCAT, mucous membranes moist.   Neck: Trach site- erythema improving, still has drainage (not sure if this is from wound vs trach secretions   Cardiovascular: S1 S2 auscultated, RRR, no murmurs  Respiratory: Clear to auscultation bilaterally with equal chest rise  Abdomen: Soft, obese, nontender, nondistended, + bowel sounds  Extremities: warm dry without cyanosis clubbing or edema  Neuro: AAOx3, nonfocal  Skin: Erythema around trach site- improving   Psych: Appropriate mood and affect   Data Reviewed: I have personally reviewed following labs and imaging studies  CBC:  Recent Labs Lab 02/19/16 0545 02/21/16 1952  02/22/16 0928 02/22/16 1623 02/22/16 2155 02/23/16 0449 02/24/16 0813  WBC 9.9 11.8*  --   --   --   --  9.6 7.7  NEUTROABS  --  6.0  --   --   --   --   --   --   HGB 9.7* 12.1  < > 10.4* 10.6* 10.3* 9.8* 10.2*  HCT 31.7* 36.9  < > 32.4* 33.5* 32.1* 31.8* 32.8*  MCV 89.5 87.2  --   --   --   --  88.1 88.9  PLT 331 404*  --   --   --   --  350 350  < > = values in this interval not displayed. Basic Metabolic Panel:  Recent Labs Lab 02/21/16 1952 02/22/16 0102 02/23/16 0449 02/24/16 0813  NA 137 139 139 140  K 4.4 3.6 3.4* 3.5  CL 100* 97* 101 104  CO2 29 30 29 27   GLUCOSE 107* 138* 115* 105*  BUN 9 11 16 11   CREATININE 1.13* 1.54* 1.53* 1.17*  CALCIUM 8.9 9.1 8.8* 8.8*   GFR: Estimated Creatinine Clearance: 81.1 mL/min (by C-G formula based on SCr of  1.17 mg/dL). Liver Function Tests:  Recent Labs Lab 02/21/16 1952  AST 29  ALT 33  ALKPHOS 101  BILITOT 1.0  PROT 7.2  ALBUMIN 3.0*   No results for input(s): LIPASE, AMYLASE in the last 168 hours. No results for input(s): AMMONIA in the last 168 hours. Coagulation Profile: No results for input(s): INR, PROTIME in the last 168 hours. Cardiac Enzymes:  Recent Labs Lab 02/22/16 1623  TROPONINI 0.03*   BNP (last 3 results) No results for input(s): PROBNP in the last 8760 hours. HbA1C:  No results for input(s): HGBA1C in the last 72 hours. CBG:  Recent Labs Lab 02/19/16 1704 02/19/16 2229 02/20/16 0737 02/20/16 1115 02/22/16 0222  GLUCAP 102* 104* 131* 128* 124*   Lipid Profile: No results for input(s): CHOL, HDL, LDLCALC, TRIG, CHOLHDL, LDLDIRECT in the last 72 hours. Thyroid Function Tests: No results for input(s): TSH, T4TOTAL, FREET4, T3FREE, THYROIDAB in the last 72 hours. Anemia Panel: No results for input(s): VITAMINB12, FOLATE, FERRITIN, TIBC, IRON, RETICCTPCT in the last 72 hours. Urine analysis:    Component Value Date/Time   COLORURINE YELLOW 02/16/2016 1310   APPEARANCEUR HAZY (A) 02/16/2016 1310   LABSPEC 1.015 02/16/2016 1310   PHURINE 5.5 02/16/2016 1310   GLUCOSEU NEGATIVE 02/16/2016 1310   HGBUR NEGATIVE 02/16/2016 1310   BILIRUBINUR NEGATIVE 02/16/2016 1310   KETONESUR NEGATIVE 02/16/2016 1310   PROTEINUR NEGATIVE 02/16/2016 1310   UROBILINOGEN 0.2 06/16/2014 0955   NITRITE NEGATIVE 02/16/2016 1310   LEUKOCYTESUR NEGATIVE 02/16/2016 1310   Sepsis Labs: @LABRCNTIP (procalcitonin:4,lacticidven:4)  ) Recent Results (from the past 240 hour(s))  Culture, blood (Routine X 2) w Reflex to ID Panel     Status: None (Preliminary result)   Collection Time: 02/21/16  7:52 PM  Result Value Ref Range Status   Specimen Description BLOOD LEFT ANTECUBITAL  Final   Special Requests BOTTLES DRAWN AEROBIC AND ANAEROBIC 5CC  Final   Culture NO GROWTH 2  DAYS  Final   Report Status PENDING  Incomplete  Culture, blood (single)     Status: None (Preliminary result)   Collection Time: 02/21/16 11:21 PM  Result Value Ref Range Status   Specimen Description BLOOD LEFT HAND  Final   Special Requests BOTTLES DRAWN AEROBIC AND ANAEROBIC 5CC  Final   Culture NO GROWTH 2 DAYS  Final   Report Status PENDING  Incomplete      Radiology Studies: Ct Soft Tissue Neck W Contrast  Result Date: 02/22/2016 CLINICAL DATA:  Tracheostomy. Hemoptysis. Tracheostomy placed 1 week ago. Fevers and chills. EXAM: CT NECK WITH CONTRAST TECHNIQUE: Multidetector CT imaging of the neck was performed using the standard protocol following the bolus administration of intravenous contrast. CONTRAST:  75mL ISOVUE-300 IOPAMIDOL (ISOVUE-300) INJECTION 61% COMPARISON:  11/26/2015 FINDINGS: Pharynx and larynx: No mucosal or submucosal lesion visible. Possible vocal cord paresis on the right. Tracheostomy appears well positioned without evidence of placement site complication. Tracheostomy does not have extensive purchase into the trachea and 1 might consider a longer tube in the future. Salivary glands: Parotid glands are normal. Left submandibular gland is normal. The patient has developed enlargement and indistinctness of the submandibular gland on the right with areas of internal low density and a few microcalcifications. 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 This gland appeared normal 3 months ago. Thyroid: Normal Lymph nodes: No enlarged or low-density nodes identified. Vascular: Negative Limited intracranial: Negative Visualized orbits: Normal Mastoids and visualized paranasal sinuses: Clear Skeleton: No significant finding IMPRESSION: Development of enlargement, indistinctness and low-density affecting the submandibular gland on the right consistent with sialoadenitis. Tracheostomy in place. Note that the tracheostomy does have its tip within the  trachea, but only by a cm or so. Consider use of a a longer to in the future. Electronically Signed   By: Paulina Fusi M.D.   On: 02/22/2016 20:08   Ct Chest W Contrast  Result Date: 02/22/2016 CLINICAL DATA:  55 year old female with hemoptysis. Tracheostomy placement 4 days ago. Fever, throat pain, shortness of breath and chest pain. Initial  encounter. EXAM: CT CHEST WITH CONTRAST TECHNIQUE: Multidetector CT imaging of the chest was performed during intravenous contrast administration. CONTRAST:  75mL ISOVUE-300 IOPAMIDOL (ISOVUE-300) INJECTION 61% COMPARISON:  Neck CT from today reported separately. Portable chest radiographs 02/21/2016 and earlier. CT Abdomen and Pelvis 04/11/2013. FINDINGS: Tracheostomy tube appears satisfactorily placed. See also neck CT from today reported separately. Large body habitus. Major airways are patent. Mild bilateral lower lobe atelectasis. Mild superimposed lung based mosaic attenuation favored to reflect gas trapping. No pleural effusion. Cardiomegaly appears stable since 2014. No pericardial effusion. No thoracic lymphadenopathy. Thoracic aorta and central pulmonary artery is are normally enhancing. Negative visualized liver, spleen, pancreas, adrenal glands, left kidney, and bowel in the upper abdomen. No acute osseous abnormality identified. IMPRESSION: 1. Tracheostomy tube appears satisfactorily placed. See also neck CT from today reported separately. 2. No acute pulmonary findings other than mild atelectasis in gas trapping at the lung bases. 3. Stable cardiomegaly. Electronically Signed   By: Odessa Fleming M.D.   On: 02/22/2016 20:05     Scheduled Meds: .  ceFAZolin (ANCEF) IV  2 g Intravenous Q8H  . ferrous sulfate  325 mg Oral Daily  . pantoprazole  40 mg Oral Daily  . pregabalin  50 mg Oral Daily  . saccharomyces boulardii  250 mg Oral BID  . sodium chloride flush  3 mL Intravenous Q12H  . sodium chloride flush  3 mL Intravenous Q12H  . traZODone  300 mg Oral  QHS   Continuous Infusions:    LOS: 2 days   Time Spent in minutes   30 minutes  Coree Riester D.O. on 02/24/2016 at 11:48 AM  Between 7am to 7pm - Pager - [phone]  After 7pm go to [email or web address] - password [email or web address]  And look for the night coverage person covering for me after hours  [facility] Office  [phone]

## 2016-02-24 NOTE — [place] FL2 (Signed)
[place] MEDICAID FL2 LEVEL OF CARE SCREENING TOOL     IDENTIFICATION  Patient Name: Yesenia [name] Birthdate: 11/12/1960 Sex: female Admission Date (Current Location): 02/21/2016  [place] [place]   Facility and Address:  [facility], [address], [place], [place] [address]      Provider Number: [id number]  Attending Physician Name and Address:  Edsel PetrinMaryann Mikhail, DO  Relative Name and Phone Number:  Rogers SeedsCatrina, daughter, [phone]    Current Level of Care: Hospital Recommended Level of Care: Skilled Nursing Facility Prior Approval Number:    Date Approved/Denied:   PASRR Number: [id number]  Discharge Plan: SNF    Current Diagnoses: Patient Active Problem List   Diagnosis Date Noted  . Fibromyalgia 02/22/2016  . Cellulitis 02/21/2016  . Abdominal pain 02/16/2016  . Constipation 02/16/2016  . CKD (chronic kidney disease) stage 3, GFR 30-59 ml/min 02/16/2016  . Fever   . Cough   . Sleep apnea 02/13/2016  . Insomnia 08/10/2015  . Polyneuropathy (HCC) 07/10/2015  . GERD (gastroesophageal reflux disease) 07/10/2015  . Acute kidney injury superimposed on chronic kidney disease (HCC) 07/07/2015  . Gait disturbance 07/07/2015  . Morbid obesity (HCC) 07/07/2015  . Left hip pain 07/06/2015  . Chronic pain   . Right sided weakness   . Headache(784.0) 04/14/2013  . Encephalopathy acute 04/11/2013  . Acute pyelonephritis 04/11/2013  . Diabetes mellitus, type 2 (HCC) 04/11/2013  . Dyslipidemia 04/11/2013  . Chest pain 02/15/2013  . CVA (cerebral infarction) 01/14/2013  . HTN (hypertension) 01/14/2013  . Anemia 01/14/2013    Orientation RESPIRATION BLADDER Height & Weight     Self, Time, Situation, Place  Tracheostomy (FiO2=28%) Continent Weight: (!) 144.8 kg (319 lb 3.6 oz) Height:  5\' 6"  (167.6 cm)  BEHAVIORAL SYMPTOMS/MOOD NEUROLOGICAL BOWEL NUTRITION STATUS      Continent Diet (Please see DC Summary)  AMBULATORY  STATUS COMMUNICATION OF NEEDS Skin   Limited Assist Verbally Surgical wounds (Closed incision on neck)                       Personal Care Assistance Level of Assistance  Bathing, Feeding, Dressing Bathing Assistance: Limited assistance Feeding assistance: Independent Dressing Assistance: Limited assistance     Functional Limitations Info  Sight, Hearing, Speech Sight Info: Adequate Hearing Info: Adequate Speech Info: Adequate (Passey muir speaking device)    SPECIAL CARE FACTORS FREQUENCY  PT (By licensed PT)                    Contractures Contractures Info: Not present    Additional Factors Info  Code Status, Allergies, Psychotropic, Suctioning Needs Code Status Info: Full Allergies Info: Buprenorphine Hcl, Morphine And Related Psychotropic Info: Lyrica     Suctioning Needs: Patient able to suction self as needed   Current Medications (02/24/2016):  This is the current hospital active medication list Current Facility-Administered Medications  Medication Dose Route Frequency Provider Last Rate Last Dose  . 0.9 %  sodium chloride infusion  250 mL Intravenous PRN Briscoe Deutscherimothy S Opyd, MD      . acetaminophen (TYLENOL) tablet 650 mg  650 mg Oral Q6H PRN Briscoe Deutscherimothy S Opyd, MD       Or  . acetaminophen (TYLENOL) suppository 650 mg  650 mg Rectal Q6H PRN Briscoe Deutscherimothy S Opyd, MD      . benzonatate (TESSALON) capsule 100 mg  100 mg Oral TID PRN Gery Prayebby Crosley, MD   100 mg  at 02/24/16 0641  . bisacodyl (DULCOLAX) EC tablet 5 mg  5 mg Oral Daily PRN Briscoe Deutscherimothy S Opyd, MD      . ceFAZolin (ANCEF) IVPB 2g/100 mL premix  2 g Intravenous Q8H Lavone Neriimothy S Opyd, MD 200 mL/hr at 02/24/16 1415 2 g at 02/24/16 1415  . ferrous sulfate tablet 325 mg  325 mg Oral Daily Briscoe Deutscherimothy S Opyd, MD   325 mg at 02/24/16 1114  . HYDROcodone-acetaminophen (NORCO/VICODIN) 5-325 MG per tablet 1-2 tablet  1-2 tablet Oral Q4H PRN Briscoe Deutscherimothy S Opyd, MD   2 tablet at 02/24/16 1207  . ondansetron (ZOFRAN) tablet 4 mg  4 mg  Oral Q6H PRN Briscoe Deutscherimothy S Opyd, MD   4 mg at 02/22/16 0650   Or  . ondansetron (ZOFRAN) injection 4 mg  4 mg Intravenous Q6H PRN Briscoe Deutscherimothy S Opyd, MD   4 mg at 02/24/16 1435  . pantoprazole (PROTONIX) EC tablet 40 mg  40 mg Oral Daily Briscoe Deutscherimothy S Opyd, MD   40 mg at 02/24/16 1114  . polyethylene glycol (MIRALAX / GLYCOLAX) packet 17 g  17 g Oral Daily PRN Briscoe Deutscherimothy S Opyd, MD      . pregabalin (LYRICA) capsule 50 mg  50 mg Oral Daily Briscoe Deutscherimothy S Opyd, MD   50 mg at 02/24/16 1114  . saccharomyces boulardii (FLORASTOR) capsule 250 mg  250 mg Oral BID Briscoe Deutscherimothy S Opyd, MD   250 mg at 02/24/16 1113  . sodium chloride flush (NS) 0.9 % injection 3 mL  3 mL Intravenous Q12H Briscoe Deutscherimothy S Opyd, MD   3 mL at 02/24/16 1114  . sodium chloride flush (NS) 0.9 % injection 3 mL  3 mL Intravenous Q12H Briscoe Deutscherimothy S Opyd, MD   3 mL at 02/24/16 1114  . sodium chloride flush (NS) 0.9 % injection 3 mL  3 mL Intravenous PRN Briscoe Deutscherimothy S Opyd, MD      . traZODone (DESYREL) tablet 300 mg  300 mg Oral QHS Briscoe Deutscherimothy S Opyd, MD   300 mg at 02/23/16 2145     Discharge Medications: Please see discharge summary for a list of discharge medications.  Relevant Imaging Results:  Relevant Lab Results:   Additional Information SSN [id number]  Mearl LatinNadia S Matthews Franks, LCSWA

## 2016-02-25 LAB — BASIC METABOLIC PANEL
ANION GAP: 9 (ref 5–15)
BUN: 11 mg/dL (ref 6–20)
CHLORIDE: 102 mmol/L (ref 101–111)
CO2: 30 mmol/L (ref 22–32)
Calcium: 9 mg/dL (ref 8.9–10.3)
Creatinine, Ser: 1.11 mg/dL — ABNORMAL HIGH (ref 0.44–1.00)
GFR calc Af Amer: >60 mL/min (ref >60)
GFR calc non Af Amer: 55 mL/min — ABNORMAL LOW (ref >60)
GLUCOSE: 105 mg/dL — AB (ref 65–99)
POTASSIUM: 3.6 mmol/L (ref 3.5–5.1)
Sodium: 141 mmol/L (ref 135–145)

## 2016-02-25 MED ORDER — CEPHALEXIN 500 MG PO CAPS: 500.0000 mg | ORAL_CAPSULE | Freq: Three times a day (TID) | ORAL | 0 refills | Status: DC

## 2016-02-25 MED ORDER — BISACODYL 5 MG PO TBEC: 5.0000 mg | DELAYED_RELEASE_TABLET | Freq: Every day | ORAL | 0 refills | Status: DC | PRN

## 2016-02-25 MED ORDER — HYDROCODONE-ACETAMINOPHEN 5-325 MG PO TABS: 1.0000 | ORAL_TABLET | ORAL | 0 refills | Status: DC | PRN

## 2016-02-25 MED ORDER — BENZONATATE 100 MG PO CAPS: 100.0000 mg | ORAL_CAPSULE | Freq: Three times a day (TID) | ORAL | 0 refills | Status: DC | PRN

## 2016-02-25 MED ORDER — POLYETHYLENE GLYCOL 3350 17 G PO PACK: 17.0000 g | PACK | Freq: Every day | ORAL | 0 refills | Status: DC | PRN

## 2016-02-25 NOTE — Progress Notes (Signed)
Patient ID: Brantley StageSonja Vanwagoner, female   DOB: January 25, 1961, 55 y.o.   MRN: [id number] Patient admitted 8/12 with chills and wound pain.  Being treated with Ancef with assistance of wound care consultant.  Currently eating normal diet. Trach wound gaping inferiorly with redness of surrounding skin and yellow secretions.  #4 cuffless Shiley in place with good voice with Passy-Muir valve. A/P: s/p elective tracheostomy for sleep apnea, trach wound cellulitis Agree with antibiotic therapy and wound care.  Ultimately, this wound will shrink around the trach tube and stabilize.  Much of the problem relates to fat necrosis associated with the tube.

## 2016-02-25 NOTE — Progress Notes (Signed)
02/25/2016 2:52 PM  Patient attempting to change gown and trach completely came out. Another RN called this RN to the room for assistance. Replaced trach with no complications.   Respiratory notified.   Respiratory at bedside.   Attending RN notified.   PACCAR Incyanne Hill BSN, RN-BC, [employer] [facility] Phone [phone]

## 2016-02-25 NOTE — Progress Notes (Signed)
Report was given top Engineer, maintenanceMikel RN at [facility]. Patient is waiting for transportation at this time

## 2016-02-25 NOTE — Progress Notes (Signed)
RT Note: Trach care was done by RT and patients RN. Stoma site continues to be reddened and oozing secretions. A protective bandage was placed under the gauze to collect oozing secretions. Patient tolerated trach care well with no issues. [name] remains securely in place and no issues are noted presently.

## 2016-02-25 NOTE — Clinical Social Work Note (Signed)
Clinical Social Work Assessment  Patient Details  Name: Yesenia [name] MRN: [id number] Date of Birth: [birth year]  Date of referral:  02/25/16               Reason for consult:  Facility Placement ([facility] from [facility])                Permission sought to share information with:  Family Supports Permission granted to share information::  Yes, Verbal Permission Granted  Name::     Freeman CaldronCatrina Howell  Agency::     Relationship::  Daughter  Contact Information:  [phone]  Housing/Transportation Living arrangements for the past 2 months:  Skilled Nursing Facility Chiropractor([facility]) Source of Information:  Patient, Other (Comment Required) Tax inspector(Facility Liasion) Patient Interpreter Needed:  None Criminal Activity/Legal Involvement Pertinent to Current Situation/Hospitalization:  No - Comment as needed Significant Relationships:  Adult Children Lives with:  Facility Resident Do you feel safe going back to the place where you live?  Yes Need for family participation in patient care:  Yes (Comment)  Care giving concerns:  No concerns expressed by patient at bedside or daughter by phone.   Social Worker assessment / plan: CSW talked with patient at the bedside and confirmed her intent to return to [facility]. Patient also gave CSW permission to contact her daughter regarding today's discharge.  Employment status:  Disabled (Comment on whether or not currently receiving Disability) Insurance information:  [place] Recommend[place]:  Not assessed at this time Information / Referral to community resources:  Other (Comment Required) (None needed or requested as patient from skilled facility)  Patient/Family's Response to care:  No concerns expressed regarding care during hospitalization.  Patient/Family's Understanding of and Emotional Response to Diagnosis, Current Treatment, and Prognosis:  Not discussed.  Emotional Assessment Appearance:   Appears stated age Attitude/Demeanor/Rapport:  Other (Appropriate) Affect (typically observed):  Appropriate Orientation:  Oriented to Self, Oriented to Place, Oriented to  Time, Oriented to Situation Alcohol / Substance use:  Tobacco Use, Alcohol Use, Illicit Drugs (Patient reports that she does not smoke, drink alcohol or use illicit drugs) Psych involvement (Current and /or in the community):     Discharge Needs  Concerns to be addressed:  Discharge Planning Concerns Readmission within the last 30 days:  Yes Current discharge risk:  None Barriers to Discharge:  No Barriers Identified   Cristobal GoldmannCrawford, Eden Toohey Bradley, LCSW 02/25/2016, 6:21 PM

## 2016-02-25 NOTE — Discharge Summary (Signed)
Physician Discharge Summary  Yesenia [name] ZOX:[id number] DOB: 1960/09/15 DOA: 02/21/2016  PCP: Egbert GaribaldiMillsaps, KIMBERLY M, NP  Admit date: 02/21/2016 Discharge date: 02/25/2016  Time spent: 45 minutes  Recommendations for Outpatient Follow-up:  Patient will be discharged to skilled nursing facility.  Patient will need to follow up with primary care provider within one week of discharge.  Patient should continue medications as prescribed.  Patient should follow a heart healthy diet. Continue wound care.  Discharge Diagnoses:  Principal Problem:   Cellulitis Active Problems:   HTN (hypertension)   Chest pain   Sleep apnea   Fibromyalgia Chronic normocytic anemia Chronic kidney disease, [name] III [name] III wound Morbid obesity  Discharge Condition: Stable  Diet recommendation: heart healthy  Filed Weights   02/22/16 0210 02/22/16 2301 02/23/16 2113  Weight: (!) 141.4 kg (311 lb 11.2 oz) (!) 141.3 kg (311 lb 8 oz) (!) 144.8 kg (319 lb 3.6 oz)    History of present illness:  on 02/22/2016 by Dr. Danton Sewerimothy Opyd Leane Chavisis a 55 y.o.femalewith medical history significant for fibromyalgia, CKD [name] III, and severe OSA with CPAP intolerance who underwent elective tracheostomy placement on 02/18/2016 and now presents from her nursing home for evaluation of subjective fevers, chills, throat pain, dyspnea, and chest pain. Patient was discharged from the hospital on 05/31/2016 after elective tracheostomy placement. There was apparently some erythema and foul odor at the trach site and she was discharged with prescription for Keflex. Patient did not fill the prescription right away and is not yet started the medication. Since the discharge, she reports development of subjective fevers and chills, as well as increasing pain and foul odor at the trach site. Today, she had increasing difficulty catching her breath and felt as though the trach was clogged. She felt as though she was struggling to  breathe and then experienced some fleeting anterior chest pains. Chest pain was described as sharp, nonradiating, moderate in intensity, and with no alleviating or exacerbating factors identified. It was not associated diaphoresis or nausea. There has been no significant cough and no increased lower extremity edema. She was brought into the emergency room for further evaluation of her complaints.  Hospital Course:  Cellulitis -Patient reported subjective fevers day prior to admission; has leukocytosis here  -Was prescribed a course of Keflex at time of recent hospital discharge, but had not yet started  -blood cultures show no growth to date -Was placed on Ancef -Leukocytosis resolved -Wound care consulted and appreciated -CT neck/chest: Enlargement of submandibular gland on the right consistent with a sialoadenitis. Tracheostomy in place -Continue keflex upon discharge  Chest pain  -Had fleeting CP associated with dyspnea, resolved PTA -Initial EKG and troponin are reassuring  -Troponin 0.03  OSA  -Severe, resistant to CPAP  -s/p elective trach  -Dr. Jenne PaneBates consulted and appreciated, continue antibiotics  Essential Hypertension -Running low at times in ED  -Managed with Norvasc, Atenolol, and HCTZ at home  -BP currently stable, will continue to hold medications  Fibromyalgia  -Stable, continue lyrica  Chronic normocytic anemia -Hemoglobin currently 10.2, appears to be close to baseline -Continue to monitor CBC  Chronic kidney disease, [name] III -Creatinine was elevated to 1.53, however GFR still in [name] III -Creatinine currently 1.11 -Continue to monitor BMP  [name] III wound -Secondary to trach?  -present upon admission -Wound care consulted and appreciated. Continue wound care and trach sponge for absorption of secretions.   Morbid obesity -BMI >50 -Should discuss weight loss with primary care physician upon  discharge  Consultants Dr. Christia Reading,  ENT  Procedures  None  Discharge Exam: Vitals:   02/25/16 0915 02/25/16 1010  BP:  (!) 107/59  Pulse: 88 72  Resp: 20 20  Temp:  98.6 F (37 C)    Exam  General: Well developed, chronically ill appearing, no distress  HEENT: NCAT, mucous membranes moist.   Neck: Trach site- erythema improving, still has drainage   Cardiovascular: S1 S2 auscultated, RRR, no murmurs  Respiratory: Clear to auscultation bilaterally with equal chest rise  Abdomen: Soft, obese, nontender, nondistended, + bowel sounds  Extremities: warm dry without cyanosis clubbing or edema  Neuro: AAOx3, nonfocal  Skin: Erythema around trach site- improving   Psych: Appropriate mood and affect  Discharge Instructions Discharge Instructions    Discharge instructions    Complete by:  As directed   Patient will be discharged to skilled nursing facility.  Patient will need to follow up with primary care provider within one week of discharge.  Patient should continue medications as prescribed.  Patient should follow a heart healthy diet. Continue wound care.     Current Discharge Medication List    START taking these medications   Details  benzonatate (TESSALON) 100 MG capsule Take 1 capsule (100 mg total) by mouth 3 (three) times daily as needed for cough. Qty: 20 capsule, Refills: 0    bisacodyl (DULCOLAX) 5 MG EC tablet Take 1 tablet (5 mg total) by mouth daily as needed for moderate constipation. Qty: 30 tablet, Refills: 0    HYDROcodone-acetaminophen (NORCO/VICODIN) 5-325 MG tablet Take 1-2 tablets by mouth every 4 (four) hours as needed for moderate pain or severe pain. Qty: 10 tablet, Refills: 0    polyethylene glycol (MIRALAX / GLYCOLAX) packet Take 17 g by mouth daily as needed for mild constipation. Qty: 14 each, Refills: 0      CONTINUE these medications which have CHANGED   Details  cephALEXin (KEFLEX) 500 MG capsule Take 1 capsule (500 mg total) by mouth every 8 (eight) hours. Qty:  21 capsule, Refills: 0      CONTINUE these medications which have NOT CHANGED   Details  albuterol (PROVENTIL HFA;VENTOLIN HFA) 108 (90 BASE) MCG/ACT inhaler Inhale 2 puffs into the lungs every 6 (six) hours as needed for wheezing.    bacitracin ointment Apply 1 application topically 2 (two) times daily. To trach site Qty: 120 g, Refills: 0    ferrous sulfate 325 (65 FE) MG tablet Take 325 mg by mouth daily. Refills: 6    gabapentin (NEURONTIN) 300 MG capsule Take 300 mg by mouth daily as needed. For pain    hydrochlorothiazide (HYDRODIURIL) 25 MG tablet Take 25 mg by mouth daily. Refills: 3    ondansetron (ZOFRAN) 4 MG tablet Take 4 mg by mouth daily. Patient states she is taking every day per patient    pantoprazole (PROTONIX) 40 MG tablet Take 1 tablet (40 mg total) by mouth daily. Qty: 30 tablet, Refills: 0    pregabalin (LYRICA) 50 MG capsule Take 50 mg by mouth daily.    saccharomyces boulardii (FLORASTOR) 250 MG capsule Take 250 mg by mouth 2 (two) times daily.    traZODone (DESYREL) 150 MG tablet Take 300 mg by mouth daily. Refills: 0      STOP taking these medications     amLODipine (NORVASC) 10 MG tablet      atenolol (TENORMIN) 50 MG tablet        Allergies  Allergen Reactions  .  Buprenorphine Hcl Hives  . Morphine And Related Hives and Dermatitis   Follow-up Information    [name], [name], NP .   Contact information: [facility] [address] [place] [address] [phone]            The results of significant diagnostics from this hospitalization (including imaging, microbiology, ancillary and laboratory) are listed below for reference.    Significant Diagnostic Studies: Ct Soft Tissue Neck W Contrast  Result Date: 02/22/2016 CLINICAL DATA:  Tracheostomy. Hemoptysis. Tracheostomy placed 1 week ago. Fevers and chills. EXAM: CT NECK WITH CONTRAST TECHNIQUE: Multidetector CT imaging of the neck was performed using  the standard protocol following the bolus administration of intravenous contrast. CONTRAST:  75mL ISOVUE-300 IOPAMIDOL (ISOVUE-300) INJECTION 61% COMPARISON:  11/26/2015 FINDINGS: Pharynx and larynx: No mucosal or submucosal lesion visible. Possible vocal cord paresis on the right. Tracheostomy appears well positioned without evidence of placement site complication. Tracheostomy does not have extensive purchase into the trachea and 1 might consider a longer tube in the future. Salivary glands: Parotid glands are normal. Left submandibular gland is normal. The patient has developed enlargement and indistinctness of the submandibular gland on the right with areas of internal low density and a few microcalcifications. 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 This gland appeared normal 3 months ago. Thyroid: Normal Lymph nodes: No enlarged or low-density nodes identified. Vascular: Negative Limited intracranial: Negative Visualized orbits: Normal Mastoids and visualized paranasal sinuses: Clear Skeleton: No significant finding IMPRESSION: Development of enlargement, indistinctness and low-density affecting the submandibular gland on the right consistent with sialoadenitis. Tracheostomy in place. Note that the tracheostomy does have its tip within the trachea, but only by a cm or so. Consider use of a a longer to in the future. Electronically Signed   By: Paulina Fusi M.D.   On: 02/22/2016 20:08   Ct Chest W Contrast  Result Date: 02/22/2016 CLINICAL DATA:  55 year old female with hemoptysis. Tracheostomy placement 4 days ago. Fever, throat pain, shortness of breath and chest pain. Initial encounter. EXAM: CT CHEST WITH CONTRAST TECHNIQUE: Multidetector CT imaging of the chest was performed during intravenous contrast administration. CONTRAST:  75mL ISOVUE-300 IOPAMIDOL (ISOVUE-300) INJECTION 61% COMPARISON:  Neck CT from today reported separately. Portable chest radiographs  02/21/2016 and earlier. CT Abdomen and Pelvis 04/11/2013. FINDINGS: Tracheostomy tube appears satisfactorily placed. See also neck CT from today reported separately. Large body habitus. Major airways are patent. Mild bilateral lower lobe atelectasis. Mild superimposed lung based mosaic attenuation favored to reflect gas trapping. No pleural effusion. Cardiomegaly appears stable since 2014. No pericardial effusion. No thoracic lymphadenopathy. Thoracic aorta and central pulmonary artery is are normally enhancing. Negative visualized liver, spleen, pancreas, adrenal glands, left kidney, and bowel in the upper abdomen. No acute osseous abnormality identified. IMPRESSION: 1. Tracheostomy tube appears satisfactorily placed. See also neck CT from today reported separately. 2. No acute pulmonary findings other than mild atelectasis in gas trapping at the lung bases. 3. Stable cardiomegaly. Electronically Signed   By: Odessa Fleming M.D.   On: 02/22/2016 20:05   US Abdomen Complete  Addendum Date: 02/18/2016   ADDENDUM REPORT: 02/18/2016 10:50 ADDENDUM: As mentioned in main body of report, but not in Impression, fatty infiltration of the liver is noted. Electronically Signed   By: Lupita Raider, M.D.   On: 02/18/2016 10:50   Result Date: 02/18/2016 CLINICAL DATA:  Acute epigastric abdominal pain. EXAM: ABDOMEN ULTRASOUND COMPLETE COMPARISON:  None. FINDINGS:  Gallbladder: No gallstones or wall thickening visualized. No sonographic Murphy sign noted by sonographer. Common bile duct: Diameter: 3.4 mm which is within normal limits. Liver: No focal lesion identified. Increased echogenicity is noted suggesting fatty infiltration. IVC: No abnormality visualized. Pancreas: Visualized portion unremarkable. Tail not visualized due to overlying bowel gas. Spleen: Size and appearance within normal limits. Right Kidney: Length: 11.4 cm. 4 cm simple cyst seen in upper pole. Echogenicity within normal limits. No mass or hydronephrosis  visualized. Left Kidney: Length: 10.7 cm. 1.8 cm simple cyst is noted. Echogenicity within normal limits. No mass or hydronephrosis visualized. Abdominal aorta: No aneurysm visualized. Other findings: None. IMPRESSION: Bilateral simple renal cysts. No other abnormality seen in the abdomen. Electronically Signed: By: Lupita RaiderJames  Green Jr, M.D. On: 02/18/2016 10:44   Dg Chest Port 1 View  Result Date: 02/21/2016 CLINICAL DATA:  Chest pain EXAM: PORTABLE CHEST 1 VIEW COMPARISON:  02/16/2016 FINDINGS: Cardiac shadow remains enlarged. Tracheostomy tube is noted in satisfactory position. The lungs are well aerated. Stable bibasilar changes are again seen. No new focal infiltrate is noted. IMPRESSION: Stable bibasilar atelectasis/scarring. Electronically Signed   By: Alcide CleverMark  Lukens M.D.   On: 02/21/2016 20:01   Dg Chest Port 1 View  Result Date: 02/16/2016 CLINICAL DATA:  Cough and fever EXAM: PORTABLE CHEST 1 VIEW COMPARISON:  01/25/2011 FINDINGS: Cardiac shadow is enlarged. Tracheostomy tube is now seen in satisfactory position. Minimal atelectasis/ scarring is noted in the bases bilaterally. No focal confluent infiltrate is seen. No bony abnormality is noted. IMPRESSION: Mild bibasilar changes as described. Electronically Signed   By: Alcide CleverMark  Lukens M.D.   On: 02/16/2016 12:38    Microbiology: Recent Results (from the past 240 hour(s))  Culture, blood (Routine X 2) w Reflex to ID Panel     Status: None (Preliminary result)   Collection Time: 02/21/16  7:52 PM  Result Value Ref Range Status   Specimen Description BLOOD LEFT ANTECUBITAL  Final   Special Requests BOTTLES DRAWN AEROBIC AND ANAEROBIC 5CC  Final   Culture NO GROWTH 3 DAYS  Final   Report Status PENDING  Incomplete  Culture, blood (single)     Status: None (Preliminary result)   Collection Time: 02/21/16 11:21 PM  Result Value Ref Range Status   Specimen Description BLOOD LEFT HAND  Final   Special Requests BOTTLES DRAWN AEROBIC AND ANAEROBIC 5CC   Final   Culture NO GROWTH 3 DAYS  Final   Report Status PENDING  Incomplete     Labs: Basic Metabolic Panel:  Recent Labs Lab 02/21/16 1952 02/22/16 0102 02/23/16 0449 02/24/16 0813 02/25/16 0725  NA 137 139 139 140 141  K 4.4 3.6 3.4* 3.5 3.6  CL 100* 97* 101 104 102  CO2 29 30 29 27 30   GLUCOSE 107* 138* 115* 105* 105*  BUN 9 11 16 11 11   CREATININE 1.13* 1.54* 1.53* 1.17* 1.11*  CALCIUM 8.9 9.1 8.8* 8.8* 9.0   Liver Function Tests:  Recent Labs Lab 02/21/16 1952  AST 29  ALT 33  ALKPHOS 101  BILITOT 1.0  PROT 7.2  ALBUMIN 3.0*   No results for input(s): LIPASE, AMYLASE in the last 168 hours. No results for input(s): AMMONIA in the last 168 hours. CBC:  Recent Labs Lab 02/19/16 0545 02/21/16 1952  02/22/16 0928 02/22/16 1623 02/22/16 2155 02/23/16 0449 02/24/16 0813  WBC 9.9 11.8*  --   --   --   --  9.6 7.7  NEUTROABS  --  6.0  --   --   --   --   --   --   HGB 9.7* 12.1  < > 10.4* 10.6* 10.3* 9.8* 10.2*  HCT 31.7* 36.9  < > 32.4* 33.5* 32.1* 31.8* 32.8*  MCV 89.5 87.2  --   --   --   --  88.1 88.9  PLT 331 404*  --   --   --   --  350 350  < > = values in this interval not displayed. Cardiac Enzymes:  Recent Labs Lab 02/22/16 1623  TROPONINI 0.03*   BNP: BNP (last 3 results) No results for input(s): BNP in the last 8760 hours.  ProBNP (last 3 results) No results for input(s): PROBNP in the last 8760 hours.  CBG:  Recent Labs Lab 02/19/16 1704 02/19/16 2229 02/20/16 0737 02/20/16 1115 02/22/16 0222  GLUCAP 102* 104* 131* 128* 124*       Signed:  Edsel Petrin  [facility] 02/25/2016, 11:31 AM

## 2016-02-25 NOTE — Progress Notes (Signed)
RN was paged to a patient room because the track fell off. Patient at first stated it is the wrong size and then said she was eating and the track fell off. Consulting civil engineerCharge RN and RT came in and the trach was reinsert back in. Pt is AOX 4 and lungs are clear

## 2016-02-25 NOTE — Progress Notes (Signed)
RT Note: RT was called by the patients RN because while the patient was repositioning, her trach became dislodged and came out. RT instructed the RN to reinsert the trach and that I was en route. Patient was stable at that time. When I came to see the patient the trach had already been reinserted successfully and 2 RN's were at bedside holding the trach in place. Yesenia [name] Mayorach ties were placed back on the trach to secure it, bilateral breath sounds were assessed and are equal and bilateral, and a positive ETCO2 was noted via ETCO2 monitor. Stoma site continues to be reddened and with oozing secretions. Patient is stable on trach collar at 28% with an Spo2-97%. RT will continue to monitor.

## 2016-02-25 NOTE — Progress Notes (Signed)
RT called d/t RN called re: pt pulled trach out.  Upon entering room, noted RN had already reinserted trach.  + [facility] =, + easy cap color change (purple to yellow), no distress noted.  Sat 98%.  RN at bedside.  VSS.   See prev.note at 1452 for same findings

## 2016-02-25 NOTE — Clinical Social Work Note (Signed)
Patient medically stable to discharge back to [facility] nursing facility today. Discharge clinicals transmitted to facility and ambulance transport arranged. Patient gave CSW permission to contact patient's daughter Freeman CaldronCatrina Howell [phone]) regarding today's discharge and call made.  Genelle BalVanessa Laquan Ludden, MSW, LCSW Licensed Clinical Social Worker Clinical Social Work Department [facility] [phone]

## 2016-02-26 LAB — CULTURE, BLOOD (SINGLE): CULTURE: NO GROWTH

## 2016-02-26 LAB — CULTURE, BLOOD (ROUTINE X 2): Culture: NO GROWTH

## 2016-03-01 ENCOUNTER — Non-Acute Institutional Stay (SKILLED_NURSING_FACILITY): Payer: Medicaid Other | Admitting: Internal Medicine

## 2016-03-01 ENCOUNTER — Encounter: Payer: Self-pay | Admitting: Internal Medicine

## 2016-03-01 DIAGNOSIS — D509 Iron deficiency anemia, unspecified: Secondary | ICD-10-CM

## 2016-03-01 DIAGNOSIS — G8929 Other chronic pain: Secondary | ICD-10-CM | POA: Diagnosis not present

## 2016-03-01 DIAGNOSIS — G629 Polyneuropathy, unspecified: Secondary | ICD-10-CM

## 2016-03-01 DIAGNOSIS — K219 Gastro-esophageal reflux disease without esophagitis: Secondary | ICD-10-CM | POA: Diagnosis not present

## 2016-03-01 DIAGNOSIS — G4700 Insomnia, unspecified: Secondary | ICD-10-CM | POA: Diagnosis not present

## 2016-03-01 DIAGNOSIS — M797 Fibromyalgia: Secondary | ICD-10-CM | POA: Diagnosis not present

## 2016-03-01 DIAGNOSIS — N183 Chronic kidney disease, stage 3 unspecified: Secondary | ICD-10-CM

## 2016-03-01 DIAGNOSIS — E1122 Type 2 diabetes mellitus with diabetic chronic kidney disease: Secondary | ICD-10-CM | POA: Diagnosis not present

## 2016-03-01 DIAGNOSIS — F411 Generalized anxiety disorder: Secondary | ICD-10-CM

## 2016-03-01 NOTE — Progress Notes (Signed)
Patient ID: Yesenia [name], female   DOB: 1961/06/11, 55 y.o.   MRN: [id number]    HISTORY AND PHYSICAL   DATE: 03/01/16  Location:    [facility]   Place of Service: SNF (31)   Extended Emergency Contact Information Primary Emergency Contact: Howell,Catrina  [place] Phone: [phone] Relation: Daughter  Advanced Directive information  FULL CODE; MOST FORM ON CHART  Chief Complaint  Patient presents with  . New Admit To SNF    HPI:  55 yo female seen today as a new admission into SNF following hospital stay for severe OSA, DM, fever, cough, abdominal pain, constipation, CKD, morbid obesity. She presented to hospital for elective tracheostomy due to severe OSA, unable to tolerate CPAP. No immediate post op complications. She c/o epigastric pain but thought to be musculoskeletal vs diet related. Trach changed to cuffless 4  Shiley tube on POD 4. She tolerated Passy-Muir valve for speech. Keflex added at d/c due to odor, soreness and skin redness at trach site. Cr 1.11; Albumin 3.0; Hgb 10.2; WBC 7.7K; HIV Nonreactive; She presents to SNF for short term rehab where she will also receive trach self care education.  She is c/a pain and has increased worrying at night. She will complete keflex abx on 8/24th for cellulitis at trach insertion site. No nursing issues. No falls. Appetite ok. Not sleeping well.  Hx anemia - stable. Hgb 10.2. She takes iron  OSA - s/p trach. She did tolerate CPAP. She has morbid obesity which contributes to OSA. She takes albuterol HFA  HTN - BP stable on HCTZ  DM - diet controlled. A1c 6.1%  Chronic pain syndrome/FMS - stable on norco, lyrica, gabapentin  Insomnia - stable on trazodone  GERD/constipation - stable on protonix, zofran prn and bowel regimen.  CKD - stable. Cr 1.1  Past Medical History:  Diagnosis Date  . Anemia   . Anemia   . Chronic pain   . CKD (chronic kidney disease)   . Diabetes mellitus without  complication (HCC)    diet controlled. does not check CBG's  . Fibromyalgia    hospitilized 12/16 due to inability to walk  . Hyperlipemia   . Hypertension   . Insomnia   . Neuropathy ([place])   . Right sided weakness   . Stroke [facility]) 2014?   no residual weakness   . Vitamin deficiency    Vit D    Past Surgical History:  Procedure Laterality Date  . [place]  . LEG SURGERY Right 2007   Surgery x3 2 arthroscopies and one rod  . OOPHORECTOMY Right 1993?  . TRACHEOSTOMY TUBE PLACEMENT N/A 02/13/2016   Procedure: TRACHEOSTOMY;  Surgeon: Melida Quitter, MD;  Location: [place];  Service: ENT;  Laterality: N/A;    Patient Care Team: Everardo Beals, NP as PCP - General Sofie Rower, PA-C as Physician Assistant (Physician Assistant)  Social History   Social History  . Marital status: Single    Spouse name: N/A  . Number of children: 2  . Years of education: 12th    Occupational History  . [employer]    Social History Main Topics  . Smoking status: Never Smoker  . Smokeless tobacco: Never Used  . Alcohol use No  . Drug use: No  . Sexual activity: No   Other Topics Concern  . Not on file   Social History Narrative   Reports no caffeine use      reports that  she has never smoked. She has never used smokeless tobacco. She reports that she does not drink alcohol or use drugs.  Family History  Problem Relation Age of Onset  . Breast cancer Mother   . Leukemia Brother   . Breast cancer Maternal Grandmother    Family Status  Relation Status  . Mother Deceased  . Brother   . Maternal Grandmother     Immunization History  Administered Date(s) Administered  . Influenza,inj,Quad PF,36+ Mos 04/12/2013  . Pneumococcal Polysaccharide-23 04/12/2013    Allergies  Allergen Reactions  . Buprenorphine Hcl Hives  . Morphine And Related Hives and Dermatitis    Medications: Patient's Medications  New Prescriptions   No medications on file  Previous  Medications   ALBUTEROL (PROVENTIL HFA;VENTOLIN HFA) 108 (90 BASE) MCG/ACT INHALER    Inhale 2 puffs into the lungs every 6 (six) hours as needed for wheezing.   BACITRACIN OINTMENT    Apply 1 application topically 2 (two) times daily. To trach site   BENZONATATE (TESSALON) 100 MG CAPSULE    Take 1 capsule (100 mg total) by mouth 3 (three) times daily as needed for cough.   BISACODYL (DULCOLAX) 5 MG EC TABLET    Take 1 tablet (5 mg total) by mouth daily as needed for moderate constipation.   CEPHALEXIN (KEFLEX) 500 MG CAPSULE    Take 1 capsule (500 mg total) by mouth every 8 (eight) hours.   FERROUS SULFATE 325 (65 FE) MG TABLET    Take 325 mg by mouth daily.   GABAPENTIN (NEURONTIN) 300 MG CAPSULE    Take 300 mg by mouth daily as needed. For pain   HYDROCHLOROTHIAZIDE (HYDRODIURIL) 25 MG TABLET    Take 25 mg by mouth daily.   HYDROCODONE-ACETAMINOPHEN (NORCO/VICODIN) 5-325 MG TABLET    Take 1-2 tablets by mouth every 4 (four) hours as needed for moderate pain or severe pain.   ONDANSETRON (ZOFRAN) 4 MG TABLET    Take 4 mg by mouth daily. Patient states she is taking every day per patient   PANTOPRAZOLE (PROTONIX) 40 MG TABLET    Take 1 tablet (40 mg total) by mouth daily.   POLYETHYLENE GLYCOL (MIRALAX / GLYCOLAX) PACKET    Take 17 g by mouth daily as needed for mild constipation.   PREGABALIN (LYRICA) 50 MG CAPSULE    Take 50 mg by mouth daily.   SACCHAROMYCES BOULARDII (FLORASTOR) 250 MG CAPSULE    Take 250 mg by mouth 2 (two) times daily.   TRAZODONE (DESYREL) 150 MG TABLET    Take 300 mg by mouth daily.  Modified Medications   No medications on file  Discontinued Medications   No medications on file    Review of Systems  Musculoskeletal: Positive for arthralgias.  Neurological: Positive for numbness.  Psychiatric/Behavioral: Positive for sleep disturbance. The patient is nervous/anxious.   All other systems reviewed and are negative.   Vitals:   03/01/16 0933  BP: (P) 106/68    Pulse: (P) 78  Temp: (P) 99.6 F (37.6 C)  SpO2: 92%  Weight: (!) 314 lb (142.4 kg)   Body mass index is 50.68 kg/m.  Physical Exam  Constitutional: She is oriented to person, place, and time. She appears well-developed and well-nourished.  Lying in bed resting in NAD, trach O2 intact  HENT:  Mouth/Throat: Oropharynx is clear and moist. No oropharyngeal exudate.  Eyes: Pupils are equal, round, and reactive to light. No scleral icterus.  Neck: Neck supple. Carotid bruit is  not present. No tracheal deviation present. No thyromegaly present.  Trach intact. Mo bloody sputum ot purulent sputum. No redness or swelling at insertion site  Cardiovascular: Regular rhythm and intact distal pulses.  Tachycardia present.  Exam reveals no gallop and no friction rub.   Murmur heard.  Systolic murmur is present with a grade of 1/6  No LE edema b/l. no calf TTP.   Pulmonary/Chest: Effort normal. No stridor. No respiratory distress. She has wheezes (end expiratory b/l). She has no rales.  Abdominal: Soft. Bowel sounds are normal. She exhibits no distension and no mass. There is no hepatomegaly. There is no tenderness. There is no rebound and no guarding.  Lymphadenopathy:    She has no cervical adenopathy.  Neurological: She is alert and oriented to person, place, and time. She has normal reflexes.  Skin: Skin is warm and dry. No rash noted.  Psychiatric: She has a normal mood and affect. Her behavior is normal. Judgment and thought content normal.     Labs reviewed: Admission on 02/21/2016, Discharged on 02/25/2016  Component Date Value Ref Range Status  . Sodium 02/21/2016 137  135 - 145 mmol/L Final  . Potassium 02/21/2016 4.4  3.5 - 5.1 mmol/L Final  . Chloride 02/21/2016 100* 101 - 111 mmol/L Final  . CO2 02/21/2016 29  22 - 32 mmol/L Final  . Glucose, Bld 02/21/2016 107* 65 - 99 mg/dL Final  . BUN 02/21/2016 9  6 - 20 mg/dL Final  . Creatinine, Ser 02/21/2016 1.13* 0.44 - 1.00 mg/dL  Final  . Calcium 02/21/2016 8.9  8.9 - 10.3 mg/dL Final  . Total Protein 02/21/2016 7.2  6.5 - 8.1 g/dL Final  . Albumin 02/21/2016 3.0* 3.5 - 5.0 g/dL Final  . AST 02/21/2016 29  15 - 41 U/L Final  . ALT 02/21/2016 33  14 - 54 U/L Final  . Alkaline Phosphatase 02/21/2016 101  38 - 126 U/L Final  . Total Bilirubin 02/21/2016 1.0  0.3 - 1.2 mg/dL Final  . GFR calc non Af Amer 02/21/2016 54* >60 mL/min Final  . GFR calc Af Amer 02/21/2016 >60  >60 mL/min Final   Comment: (NOTE) The eGFR has been calculated using the CKD EPI equation. This calculation has not been validated in all clinical situations. eGFR's persistently <60 mL/min signify possible Chronic Kidney Disease.   . Anion gap 02/21/2016 8  5 - 15 Final  . WBC 02/21/2016 11.8* 4.0 - 10.5 K/uL Final  . RBC 02/21/2016 4.23  3.87 - 5.11 MIL/uL Final  . Hemoglobin 02/21/2016 12.1  12.0 - 15.0 g/dL Final  . HCT 02/21/2016 36.9  36.0 - 46.0 % Final  . MCV 02/21/2016 87.2  78.0 - 100.0 fL Final  . MCH 02/21/2016 28.6  26.0 - 34.0 pg Final  . MCHC 02/21/2016 32.8  30.0 - 36.0 g/dL Final  . RDW 02/21/2016 13.8  11.5 - 15.5 % Final  . Platelets 02/21/2016 404* 150 - 400 K/uL Final  . Neutrophils Relative % 02/21/2016 51  % Final  . Neutro Abs 02/21/2016 6.0  1.7 - 7.7 K/uL Final  . Lymphocytes Relative 02/21/2016 37  % Final  . Lymphs Abs 02/21/2016 4.4* 0.7 - 4.0 K/uL Final  . Monocytes Relative 02/21/2016 10  % Final  . Monocytes Absolute 02/21/2016 1.1* 0.1 - 1.0 K/uL Final  . Eosinophils Relative 02/21/2016 2  % Final  . Eosinophils Absolute 02/21/2016 0.3  0.0 - 0.7 K/uL Final  . Basophils Relative 02/21/2016 0  %  Final  . Basophils Absolute 02/21/2016 0.0  0.0 - 0.1 K/uL Final  . Troponin i, poc 02/21/2016 0.01  0.00 - 0.08 ng/mL Final  . Comment 3 02/21/2016          Final   Comment: Due to the release kinetics of cTnI, a negative result within the first hours of the onset of symptoms does not rule out myocardial  infarction with certainty. If myocardial infarction is still suspected, repeat the test at appropriate intervals.   . Lactic Acid, Venous 02/21/2016 1.13  0.5 - 1.9 mmol/L Final  . Specimen Description 02/26/2016 BLOOD LEFT ANTECUBITAL   Final  . Special Requests 02/26/2016 BOTTLES DRAWN AEROBIC AND ANAEROBIC 5CC   Final  . Culture 02/26/2016 NO GROWTH 5 DAYS   Final  . Report Status 02/26/2016 02/26/2016 FINAL   Final  . Specimen Description 02/26/2016 BLOOD LEFT HAND   Final  . Special Requests 02/26/2016 BOTTLES DRAWN AEROBIC AND ANAEROBIC 5CC   Final  . Culture 02/26/2016 NO GROWTH 5 DAYS   Final  . Report Status 02/26/2016 02/26/2016 FINAL   Final  . HIV Screen 4th Generation wRfx 02/22/2016 Non Reactive  Non Reactive Final   Comment: (NOTE) Performed At: [facility] [place], [place] [id number] Lindon Romp MD QI:[id number]   . Sodium 02/22/2016 139  135 - 145 mmol/L Final  . Potassium 02/22/2016 3.6  3.5 - 5.1 mmol/L Final  . Chloride 02/22/2016 97* 101 - 111 mmol/L Final  . CO2 02/22/2016 30  22 - 32 mmol/L Final  . Glucose, Bld 02/22/2016 138* 65 - 99 mg/dL Final  . BUN 02/22/2016 11  6 - 20 mg/dL Final  . Creatinine, Ser 02/22/2016 1.54* 0.44 - 1.00 mg/dL Final  . Calcium 02/22/2016 9.1  8.9 - 10.3 mg/dL Final  . GFR calc non Af Amer 02/22/2016 37* >60 mL/min Final  . GFR calc Af Amer 02/22/2016 43* >60 mL/min Final   Comment: (NOTE) The eGFR has been calculated using the CKD EPI equation. This calculation has not been validated in all clinical situations. eGFR's persistently <60 mL/min signify possible Chronic Kidney Disease.   . Anion gap 02/22/2016 12  5 - 15 Final  . Glucose-Capillary 02/22/2016 124* 65 - 99 mg/dL Final  . Hemoglobin 02/22/2016 10.4* 12.0 - 15.0 g/dL Final  . HCT 02/22/2016 33.3* 36.0 - 46.0 % Final  . Hemoglobin 02/22/2016 10.4* 12.0 - 15.0 g/dL Final  . HCT 02/22/2016 32.4* 36.0 - 46.0 % Final  . Hemoglobin  02/22/2016 10.6* 12.0 - 15.0 g/dL Final  . HCT 02/22/2016 33.5* 36.0 - 46.0 % Final  . Hemoglobin 02/22/2016 10.3* 12.0 - 15.0 g/dL Final  . HCT 02/22/2016 32.1* 36.0 - 46.0 % Final  . ABO/RH(D) 02/22/2016 O POS   Final  . Antibody Screen 02/22/2016 NEG   Final  . Sample Expiration 02/22/2016 02/25/2016   Final  . ABO/RH(D) 02/22/2016 O POS   Final  . Troponin I 02/22/2016 0.03* <0.03 ng/mL Final   Comment: CRITICAL RESULT CALLED TO, READ BACK BY AND VERIFIED WITH: RN I HABIB AT [id number] MARTINB   . WBC 02/23/2016 9.6  4.0 - 10.5 K/uL Final  . RBC 02/23/2016 3.61* 3.87 - 5.11 MIL/uL Final  . Hemoglobin 02/23/2016 9.8* 12.0 - 15.0 g/dL Final  . HCT 02/23/2016 31.8* 36.0 - 46.0 % Final  . MCV 02/23/2016 88.1  78.0 - 100.0 fL Final  . MCH 02/23/2016 27.1  26.0 - 34.0 pg Final  .  MCHC 02/23/2016 30.8  30.0 - 36.0 g/dL Final  . RDW 02/23/2016 14.1  11.5 - 15.5 % Final  . Platelets 02/23/2016 350  150 - 400 K/uL Final  . Sodium 02/23/2016 139  135 - 145 mmol/L Final  . Potassium 02/23/2016 3.4* 3.5 - 5.1 mmol/L Final  . Chloride 02/23/2016 101  101 - 111 mmol/L Final  . CO2 02/23/2016 29  22 - 32 mmol/L Final  . Glucose, Bld 02/23/2016 115* 65 - 99 mg/dL Final  . BUN 02/23/2016 16  6 - 20 mg/dL Final  . Creatinine, Ser 02/23/2016 1.53* 0.44 - 1.00 mg/dL Final  . Calcium 02/23/2016 8.8* 8.9 - 10.3 mg/dL Final  . GFR calc non Af Amer 02/23/2016 37* >60 mL/min Final  . GFR calc Af Amer 02/23/2016 43* >60 mL/min Final   Comment: (NOTE) The eGFR has been calculated using the CKD EPI equation. This calculation has not been validated in all clinical situations. eGFR's persistently <60 mL/min signify possible Chronic Kidney Disease.   . Anion gap 02/23/2016 9  5 - 15 Final  . WBC 02/24/2016 7.7  4.0 - 10.5 K/uL Final  . RBC 02/24/2016 3.69* 3.87 - 5.11 MIL/uL Final  . Hemoglobin 02/24/2016 10.2* 12.0 - 15.0 g/dL Final  . HCT 02/24/2016 32.8* 36.0 - 46.0 % Final  . MCV 02/24/2016 88.9   78.0 - 100.0 fL Final  . MCH 02/24/2016 27.6  26.0 - 34.0 pg Final  . MCHC 02/24/2016 31.1  30.0 - 36.0 g/dL Final  . RDW 02/24/2016 14.1  11.5 - 15.5 % Final  . Platelets 02/24/2016 350  150 - 400 K/uL Final  . Sodium 02/24/2016 140  135 - 145 mmol/L Final  . Potassium 02/24/2016 3.5  3.5 - 5.1 mmol/L Final  . Chloride 02/24/2016 104  101 - 111 mmol/L Final  . CO2 02/24/2016 27  22 - 32 mmol/L Final  . Glucose, Bld 02/24/2016 105* 65 - 99 mg/dL Final  . BUN 02/24/2016 11  6 - 20 mg/dL Final  . Creatinine, Ser 02/24/2016 1.17* 0.44 - 1.00 mg/dL Final  . Calcium 02/24/2016 8.8* 8.9 - 10.3 mg/dL Final  . GFR calc non Af Amer 02/24/2016 52* >60 mL/min Final  . GFR calc Af Amer 02/24/2016 >60  >60 mL/min Final   Comment: (NOTE) The eGFR has been calculated using the CKD EPI equation. This calculation has not been validated in all clinical situations. eGFR's persistently <60 mL/min signify possible Chronic Kidney Disease.   . Anion gap 02/24/2016 9  5 - 15 Final  . Sodium 02/25/2016 141  135 - 145 mmol/L Final  . Potassium 02/25/2016 3.6  3.5 - 5.1 mmol/L Final  . Chloride 02/25/2016 102  101 - 111 mmol/L Final  . CO2 02/25/2016 30  22 - 32 mmol/L Final  . Glucose, Bld 02/25/2016 105* 65 - 99 mg/dL Final  . BUN 02/25/2016 11  6 - 20 mg/dL Final  . Creatinine, Ser 02/25/2016 1.11* 0.44 - 1.00 mg/dL Final  . Calcium 02/25/2016 9.0  8.9 - 10.3 mg/dL Final  . GFR calc non Af Amer 02/25/2016 55* >60 mL/min Final  . GFR calc Af Amer 02/25/2016 >60  >60 mL/min Final   Comment: (NOTE) The eGFR has been calculated using the CKD EPI equation. This calculation has not been validated in all clinical situations. eGFR's persistently <60 mL/min signify possible Chronic Kidney Disease.   . Anion gap 02/25/2016 9  5 - 15 Final  Admission on 02/13/2016, Discharged  on 02/20/2016  Component Date Value Ref Range Status  . Glucose-Capillary 02/13/2016 140* 65 - 99 mg/dL Final  . Glucose-Capillary  02/13/2016 133* 65 - 99 mg/dL Final  . Comment 1 02/13/2016 Notify RN   Final  . Comment 2 02/13/2016 Document in Chart   Final  . MRSA by PCR 02/13/2016 NEGATIVE  NEGATIVE Final   Comment:        The GeneXpert MRSA Assay (FDA approved for NASAL specimens only), is one component of a comprehensive MRSA colonization surveillance program. It is not intended to diagnose MRSA infection nor to guide or monitor treatment for MRSA infections.   . Color, Urine 02/16/2016 YELLOW  YELLOW Final  . APPearance 02/16/2016 HAZY* CLEAR Final  . Specific Gravity, Urine 02/16/2016 1.015  1.005 - 1.030 Final  . pH 02/16/2016 5.5  5.0 - 8.0 Final  . Glucose, UA 02/16/2016 NEGATIVE  NEGATIVE mg/dL Final  . Hgb urine dipstick 02/16/2016 NEGATIVE  NEGATIVE Final  . Bilirubin Urine 02/16/2016 NEGATIVE  NEGATIVE Final  . Ketones, ur 02/16/2016 NEGATIVE  NEGATIVE mg/dL Final  . Protein, ur 02/16/2016 NEGATIVE  NEGATIVE mg/dL Final  . Nitrite 02/16/2016 NEGATIVE  NEGATIVE Final  . Leukocytes, UA 02/16/2016 NEGATIVE  NEGATIVE Final  . Amylase 02/16/2016 40  28 - 100 U/L Final  . Lipase 02/16/2016 19  11 - 51 U/L Final  . Sodium 02/16/2016 140  135 - 145 mmol/L Final  . Potassium 02/16/2016 3.8  3.5 - 5.1 mmol/L Final  . Chloride 02/16/2016 105  101 - 111 mmol/L Final  . CO2 02/16/2016 27  22 - 32 mmol/L Final  . Glucose, Bld 02/16/2016 136* 65 - 99 mg/dL Final  . BUN 02/16/2016 8  6 - 20 mg/dL Final  . Creatinine, Ser 02/16/2016 1.03* 0.44 - 1.00 mg/dL Final  . Calcium 02/16/2016 9.0  8.9 - 10.3 mg/dL Final  . Total Protein 02/16/2016 6.6  6.5 - 8.1 g/dL Final  . Albumin 02/16/2016 3.0* 3.5 - 5.0 g/dL Final  . AST 02/16/2016 27  15 - 41 U/L Final  . ALT 02/16/2016 31  14 - 54 U/L Final  . Alkaline Phosphatase 02/16/2016 83  38 - 126 U/L Final  . Total Bilirubin 02/16/2016 0.7  0.3 - 1.2 mg/dL Final  . GFR calc non Af Amer 02/16/2016 >60  >60 mL/min Final  . GFR calc Af Amer 02/16/2016 >60  >60 mL/min  Final   Comment: (NOTE) The eGFR has been calculated using the CKD EPI equation. This calculation has not been validated in all clinical situations. eGFR's persistently <60 mL/min signify possible Chronic Kidney Disease.   . Anion gap 02/16/2016 8  5 - 15 Final  . WBC 02/16/2016 14.5* 4.0 - 10.5 K/uL Final  . RBC 02/16/2016 3.85* 3.87 - 5.11 MIL/uL Final  . Hemoglobin 02/16/2016 10.8* 12.0 - 15.0 g/dL Final  . HCT 02/16/2016 34.5* 36.0 - 46.0 % Final  . MCV 02/16/2016 89.6  78.0 - 100.0 fL Final  . MCH 02/16/2016 28.1  26.0 - 34.0 pg Final  . MCHC 02/16/2016 31.3  30.0 - 36.0 g/dL Final  . RDW 02/16/2016 14.9  11.5 - 15.5 % Final  . Platelets 02/16/2016 309  150 - 400 K/uL Final  . Glucose-Capillary 02/17/2016 132* 65 - 99 mg/dL Final  . Comment 1 02/17/2016 Notify RN   Final  . Glucose-Capillary 02/18/2016 133* 65 - 99 mg/dL Final  . Comment 1 02/18/2016 Notify RN   Final  . Edmonia Lynch  02/18/2016 120* 65 - 99 mg/dL Final  . Glucose-Capillary 02/18/2016 131* 65 - 99 mg/dL Final  . Comment 1 02/18/2016 Notify RN   Final  . WBC 02/19/2016 9.9  4.0 - 10.5 K/uL Final  . RBC 02/19/2016 3.54* 3.87 - 5.11 MIL/uL Final  . Hemoglobin 02/19/2016 9.7* 12.0 - 15.0 g/dL Final  . HCT 02/19/2016 31.7* 36.0 - 46.0 % Final  . MCV 02/19/2016 89.5  78.0 - 100.0 fL Final  . MCH 02/19/2016 27.4  26.0 - 34.0 pg Final  . MCHC 02/19/2016 30.6  30.0 - 36.0 g/dL Final  . RDW 02/19/2016 14.3  11.5 - 15.5 % Final  . Platelets 02/19/2016 331  150 - 400 K/uL Final  . Glucose-Capillary 02/18/2016 129* 65 - 99 mg/dL Final  . Glucose-Capillary 02/18/2016 97  65 - 99 mg/dL Final  . Glucose-Capillary 02/19/2016 142* 65 - 99 mg/dL Final  . Glucose-Capillary 02/19/2016 112* 65 - 99 mg/dL Final  . Comment 1 02/19/2016 Notify RN   Final  . Glucose-Capillary 02/19/2016 102* 65 - 99 mg/dL Final  . Comment 1 02/19/2016 Notify RN   Final  . Glucose-Capillary 02/19/2016 104* 65 - 99 mg/dL Final  .  Glucose-Capillary 02/20/2016 131* 65 - 99 mg/dL Final  . Glucose-Capillary 02/20/2016 128* 65 - 99 mg/dL Final  Hospital Outpatient Visit on 02/05/2016  Component Date Value Ref Range Status  . Glucose-Capillary 02/05/2016 104* 65 - 99 mg/dL Final  . Sodium 02/05/2016 140  135 - 145 mmol/L Final  . Potassium 02/05/2016 3.8  3.5 - 5.1 mmol/L Final  . Chloride 02/05/2016 111  101 - 111 mmol/L Final  . CO2 02/05/2016 21* 22 - 32 mmol/L Final  . Glucose, Bld 02/05/2016 108* 65 - 99 mg/dL Final  . BUN 02/05/2016 14  6 - 20 mg/dL Final  . Creatinine, Ser 02/05/2016 1.17* 0.44 - 1.00 mg/dL Final  . Calcium 02/05/2016 9.4  8.9 - 10.3 mg/dL Final  . GFR calc non Af Amer 02/05/2016 52* >60 mL/min Final  . GFR calc Af Amer 02/05/2016 >60  >60 mL/min Final   Comment: (NOTE) The eGFR has been calculated using the CKD EPI equation. This calculation has not been validated in all clinical situations. eGFR's persistently <60 mL/min signify possible Chronic Kidney Disease.   . Anion gap 02/05/2016 8  5 - 15 Final  . WBC 02/05/2016 7.9  4.0 - 10.5 K/uL Final  . RBC 02/05/2016 4.52  3.87 - 5.11 MIL/uL Final  . Hemoglobin 02/05/2016 12.8  12.0 - 15.0 g/dL Final  . HCT 02/05/2016 38.9  36.0 - 46.0 % Final  . MCV 02/05/2016 86.1  78.0 - 100.0 fL Final  . MCH 02/05/2016 28.3  26.0 - 34.0 pg Final  . MCHC 02/05/2016 32.9  30.0 - 36.0 g/dL Final  . RDW 02/05/2016 14.4  11.5 - 15.5 % Final  . Platelets 02/05/2016 321  150 - 400 K/uL Final  . Hgb A1c MFr Bld 02/06/2016 6.1* 4.8 - 5.6 % Final   Comment: (NOTE)         Pre-diabetes: 5.7 - 6.4         Diabetes: >6.4         Glycemic control for adults with diabetes: <7.0   . Mean Plasma Glucose 02/06/2016 128  mg/dL Final   Comment: (NOTE) Performed At: [facility] [place], [place] [id number] Lindon Romp MD UX:[id number]     Ct Soft Tissue Neck W Contrast  Result  Date: 02/22/2016 CLINICAL DATA:  Tracheostomy. Hemoptysis.  Tracheostomy placed 1 week ago. Fevers and chills. EXAM: CT NECK WITH CONTRAST TECHNIQUE: Multidetector CT imaging of the neck was performed using the standard protocol following the bolus administration of intravenous contrast. CONTRAST:  60m ISOVUE-300 IOPAMIDOL (ISOVUE-300) INJECTION 61% COMPARISON:  11/26/2015 FINDINGS: Pharynx and larynx: No mucosal or submucosal lesion visible. Possible vocal cord paresis on the right. Tracheostomy appears well positioned without evidence of placement site complication. Tracheostomy does not have extensive purchase into the trachea and 1 might consider a longer tube in the future. Salivary glands: Parotid glands are normal. Left submandibular gland is normal. The patient has developed enlargement and indistinctness of the submandibular gland on the right with areas of internal low density and a few microcalcifications. 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 This gland appeared normal 3 months ago. Thyroid: Normal Lymph nodes: No enlarged or low-density nodes identified. Vascular: Negative Limited intracranial: Negative Visualized orbits: Normal Mastoids and visualized paranasal sinuses: Clear Skeleton: No significant finding IMPRESSION: Development of enlargement, indistinctness and low-density affecting the submandibular gland on the right consistent with sialoadenitis. Tracheostomy in place. Note that the tracheostomy does have its tip within the trachea, but only by a cm or so. Consider use of a a longer to in the future. Electronically Signed   By: MNelson ChimesM.D.   On: 02/22/2016 20:08   Ct Chest W Contrast  Result Date: 02/22/2016 CLINICAL DATA:  55year old female with hemoptysis. Tracheostomy placement 4 days ago. Fever, throat pain, shortness of breath and chest pain. Initial encounter. EXAM: CT CHEST WITH CONTRAST TECHNIQUE: Multidetector CT imaging of the chest was performed during intravenous contrast administration.  CONTRAST:  764mISOVUE-300 IOPAMIDOL (ISOVUE-300) INJECTION 61% COMPARISON:  Neck CT from today reported separately. Portable chest radiographs 02/21/2016 and earlier. CT Abdomen and Pelvis 04/11/2013. FINDINGS: Tracheostomy tube appears satisfactorily placed. See also neck CT from today reported separately. Large body habitus. Major airways are patent. Mild bilateral lower lobe atelectasis. Mild superimposed lung based mosaic attenuation favored to reflect gas trapping. No pleural effusion. Cardiomegaly appears stable since 2014. No pericardial effusion. No thoracic lymphadenopathy. Thoracic aorta and central pulmonary artery is are normally enhancing. Negative visualized liver, spleen, pancreas, adrenal glands, left kidney, and bowel in the upper abdomen. No acute osseous abnormality identified. IMPRESSION: 1. Tracheostomy tube appears satisfactorily placed. See also neck CT from today reported separately. 2. No acute pulmonary findings other than mild atelectasis in gas trapping at the lung bases. 3. Stable cardiomegaly. Electronically Signed   By: H Genevie Ann.D.   On: 02/22/2016 20:05   UsKoreabdomen Complete  Addendum Date: 02/18/2016   ADDENDUM REPORT: 02/18/2016 10:50 ADDENDUM: As mentioned in main body of report, but not in Impression, fatty infiltration of the liver is noted. Electronically Signed   By: JaMarijo ConceptionM.D.   On: 02/18/2016 10:50   Result Date: 02/18/2016 CLINICAL DATA:  Acute epigastric abdominal pain. EXAM: ABDOMEN ULTRASOUND COMPLETE COMPARISON:  None. FINDINGS: Gallbladder: No gallstones or wall thickening visualized. No sonographic Murphy sign noted by sonographer. Common bile duct: Diameter: 3.4 mm which is within normal limits. Liver: No focal lesion identified. Increased echogenicity is noted suggesting fatty infiltration. IVC: No abnormality visualized. Pancreas: Visualized portion unremarkable. Tail not visualized due to overlying bowel gas. Spleen: Size and appearance within  normal limits. Right Kidney: Length: 11.4 cm. 4 cm simple cyst seen in upper pole. Echogenicity within normal limits. No mass or hydronephrosis visualized.  Left Kidney: Length: 10.7 cm. 1.8 cm simple cyst is noted. Echogenicity within normal limits. No mass or hydronephrosis visualized. Abdominal aorta: No aneurysm visualized. Other findings: None. IMPRESSION: Bilateral simple renal cysts. No other abnormality seen in the abdomen. Electronically Signed: By: Marijo Conception, M.D. On: 02/18/2016 10:44   Dg Chest Port 1 View  Result Date: 02/21/2016 CLINICAL DATA:  Chest pain EXAM: PORTABLE CHEST 1 VIEW COMPARISON:  02/16/2016 FINDINGS: Cardiac shadow remains enlarged. Tracheostomy tube is noted in satisfactory position. The lungs are well aerated. Stable bibasilar changes are again seen. No new focal infiltrate is noted. IMPRESSION: Stable bibasilar atelectasis/scarring. Electronically Signed   By: Inez Catalina M.D.   On: 02/21/2016 20:01   Dg Chest Port 1 View  Result Date: 02/16/2016 CLINICAL DATA:  Cough and fever EXAM: PORTABLE CHEST 1 VIEW COMPARISON:  01/25/2011 FINDINGS: Cardiac shadow is enlarged. Tracheostomy tube is now seen in satisfactory position. Minimal atelectasis/ scarring is noted in the bases bilaterally. No focal confluent infiltrate is seen. No bony abnormality is noted. IMPRESSION: Mild bibasilar changes as described. Electronically Signed   By: Inez Catalina M.D.   On: 02/16/2016 12:38     Assessment/Plan   ICD-9-CM ICD-10-CM   1. Insomnia 780.52 G47.00   2. Chronic pain 338.29 G89.29   3. Polyneuropathy (HCC) 356.9 G62.9   4. Fibromyalgia 729.1 M79.7   5. Type 2 diabetes mellitus with stage 3 chronic kidney disease, without long-term current use of insulin (HCC) 250.40 E11.22    585.3 N18.3   6. Gastroesophageal reflux disease without esophagitis 530.81 K21.9   7. Iron deficiency anemia 280.9 D50.9   8. CKD (chronic kidney disease) stage 3, GFR 30-59 ml/min 585.3 N18.3   9.  Morbid obesity due to excess calories ([place]) 278.01 E66.01   10. Anxiety state 300.00 F41.1     Complete abx as ordered  Start buspar 64m po BID for anxiety  Cont other meds as ordered  PT/OT/ST as ordered  Trach care as indicated  GOAL: short term rehab and d/c home when medically appropriate. Communicated with pt and nursing.  Will follow   Braydn Carneiro S. CPerlie Gold PRestpadd [facility] [address]GKen Caryl [place] [phone] (Monday-Friday 8 AM - 5 PM) ([phone] 5 PM and follow prompts

## 2016-03-03 ENCOUNTER — Other Ambulatory Visit: Payer: Self-pay

## 2016-03-03 MED ORDER — PREGABALIN 50 MG PO CAPS: 50.0000 mg | ORAL_CAPSULE | Freq: Every day | ORAL | 5 refills | Status: DC

## 2016-03-03 MED ORDER — HYDROCODONE-ACETAMINOPHEN 5-325 MG PO TABS: 1.0000 | ORAL_TABLET | ORAL | 0 refills | Status: DC | PRN

## 2016-03-03 NOTE — Telephone Encounter (Signed)
RX faxed to [facility] @ [phone], phone number [phone] 

## 2016-03-06 ENCOUNTER — Encounter (HOSPITAL_COMMUNITY): Payer: Self-pay | Admitting: Emergency Medicine

## 2016-03-06 ENCOUNTER — Emergency Department (HOSPITAL_COMMUNITY): Payer: Medicaid Other

## 2016-03-06 ENCOUNTER — Inpatient Hospital Stay (HOSPITAL_COMMUNITY)
Admission: EM | Admit: 2016-03-06 | Discharge: 2016-03-11 | DRG: 193 | Disposition: A | Discharge status: Skilled Nursing Facility | Payer: Medicaid Other | Attending: Family Medicine | Admitting: Family Medicine

## 2016-03-06 DIAGNOSIS — Z888 Allergy status to other drugs, medicaments and biological substances status: Secondary | ICD-10-CM | POA: Diagnosis not present

## 2016-03-06 DIAGNOSIS — K219 Gastro-esophageal reflux disease without esophagitis: Secondary | ICD-10-CM | POA: Diagnosis present

## 2016-03-06 DIAGNOSIS — N183 Chronic kidney disease, stage 3 unspecified: Secondary | ICD-10-CM | POA: Diagnosis present

## 2016-03-06 DIAGNOSIS — J40 Bronchitis, not specified as acute or chronic: Secondary | ICD-10-CM | POA: Diagnosis present

## 2016-03-06 DIAGNOSIS — E785 Hyperlipidemia, unspecified: Secondary | ICD-10-CM | POA: Diagnosis present

## 2016-03-06 DIAGNOSIS — E1122 Type 2 diabetes mellitus with diabetic chronic kidney disease: Secondary | ICD-10-CM | POA: Diagnosis present

## 2016-03-06 DIAGNOSIS — G4700 Insomnia, unspecified: Secondary | ICD-10-CM | POA: Diagnosis present

## 2016-03-06 DIAGNOSIS — Z930 Tracheostomy status: Secondary | ICD-10-CM

## 2016-03-06 DIAGNOSIS — M797 Fibromyalgia: Secondary | ICD-10-CM | POA: Diagnosis present

## 2016-03-06 DIAGNOSIS — G8929 Other chronic pain: Secondary | ICD-10-CM | POA: Diagnosis present

## 2016-03-06 DIAGNOSIS — N179 Acute kidney failure, unspecified: Secondary | ICD-10-CM | POA: Diagnosis present

## 2016-03-06 DIAGNOSIS — Y95 Nosocomial condition: Secondary | ICD-10-CM | POA: Diagnosis present

## 2016-03-06 DIAGNOSIS — R0602 Shortness of breath: Secondary | ICD-10-CM

## 2016-03-06 DIAGNOSIS — Z885 Allergy status to narcotic agent status: Secondary | ICD-10-CM

## 2016-03-06 DIAGNOSIS — F419 Anxiety disorder, unspecified: Secondary | ICD-10-CM | POA: Diagnosis present

## 2016-03-06 DIAGNOSIS — Z6841 Body Mass Index (BMI) 40.0 and over, adult: Secondary | ICD-10-CM

## 2016-03-06 DIAGNOSIS — G4730 Sleep apnea, unspecified: Secondary | ICD-10-CM | POA: Diagnosis not present

## 2016-03-06 DIAGNOSIS — J189 Pneumonia, unspecified organism: Secondary | ICD-10-CM | POA: Diagnosis present

## 2016-03-06 DIAGNOSIS — E1142 Type 2 diabetes mellitus with diabetic polyneuropathy: Secondary | ICD-10-CM | POA: Diagnosis present

## 2016-03-06 DIAGNOSIS — N189 Chronic kidney disease, unspecified: Secondary | ICD-10-CM

## 2016-03-06 DIAGNOSIS — Z79899 Other long term (current) drug therapy: Secondary | ICD-10-CM | POA: Diagnosis not present

## 2016-03-06 DIAGNOSIS — J9601 Acute respiratory failure with hypoxia: Secondary | ICD-10-CM

## 2016-03-06 DIAGNOSIS — G4733 Obstructive sleep apnea (adult) (pediatric): Secondary | ICD-10-CM | POA: Diagnosis present

## 2016-03-06 DIAGNOSIS — F329 Major depressive disorder, single episode, unspecified: Secondary | ICD-10-CM | POA: Diagnosis present

## 2016-03-06 DIAGNOSIS — E876 Hypokalemia: Secondary | ICD-10-CM | POA: Diagnosis present

## 2016-03-06 DIAGNOSIS — E662 Morbid (severe) obesity with alveolar hypoventilation: Secondary | ICD-10-CM | POA: Diagnosis present

## 2016-03-06 DIAGNOSIS — I129 Hypertensive chronic kidney disease with stage 1 through stage 4 chronic kidney disease, or unspecified chronic kidney disease: Secondary | ICD-10-CM | POA: Diagnosis present

## 2016-03-06 LAB — CBC
HCT: 36.8 % (ref 36.0–46.0)
Hemoglobin: 11.8 g/dL — ABNORMAL LOW (ref 12.0–15.0)
MCH: 28.2 pg (ref 26.0–34.0)
MCHC: 32.1 g/dL (ref 30.0–36.0)
MCV: 88 fL (ref 78.0–100.0)
Platelets: 396 10*3/uL (ref 150–400)
RBC: 4.18 MIL/uL (ref 3.87–5.11)
RDW: 14.1 % (ref 11.5–15.5)
WBC: 7.1 10*3/uL (ref 4.0–10.5)

## 2016-03-06 LAB — BASIC METABOLIC PANEL
Anion gap: 9 (ref 5–15)
BUN: 16 mg/dL (ref 6–20)
CALCIUM: 9.5 mg/dL (ref 8.9–10.3)
CO2: 26 mmol/L (ref 22–32)
CREATININE: 1.26 mg/dL — AB (ref 0.44–1.00)
Chloride: 105 mmol/L (ref 101–111)
GFR calc non Af Amer: 47 mL/min — ABNORMAL LOW (ref >60)
GFR, EST AFRICAN AMERICAN: 55 mL/min — AB (ref >60)
GLUCOSE: 110 mg/dL — AB (ref 65–99)
Potassium: 3.1 mmol/L — ABNORMAL LOW (ref 3.5–5.1)
Sodium: 140 mmol/L (ref 135–145)

## 2016-03-06 LAB — I-STAT TROPONIN, ED: Troponin i, poc: 0.01 ng/mL (ref 0.00–0.08)

## 2016-03-06 LAB — I-STAT VENOUS BLOOD GAS, ED
Acid-Base Excess: 7 mmol/L — ABNORMAL HIGH (ref 0.0–2.0)
Bicarbonate: 29.2 mEq/L — ABNORMAL HIGH (ref 20.0–24.0)
O2 Saturation: 66 %
TCO2: 30 mmol/L (ref 0–100)
pCO2, Ven: 34 mmHg — ABNORMAL LOW (ref 45.0–50.0)
pH, Ven: 7.541 — ABNORMAL HIGH (ref 7.250–7.300)
pO2, Ven: 30 mmHg — ABNORMAL LOW (ref 31.0–45.0)

## 2016-03-06 LAB — I-STAT CG4 LACTIC ACID, ED
LACTIC ACID, VENOUS: 3.01 mmol/L — AB (ref 0.5–1.9)
LACTIC ACID, VENOUS: 2.18 mmol/L — AB (ref 0.5–1.9)

## 2016-03-06 LAB — D-DIMER, QUANTITATIVE (NOT AT ARMC): D DIMER QUANT: 0.36 ug{FEU}/mL (ref 0.00–0.50)

## 2016-03-06 MED ORDER — SACCHAROMYCES BOULARDII 250 MG PO CAPS
250.0000 mg | ORAL_CAPSULE | Freq: Two times a day (BID) | ORAL | Status: DC
  Administered 2016-03-06 – 2016-03-11 (×10): 250 mg via ORAL
  Filled 2016-03-06 (×10): qty 1

## 2016-03-06 MED ORDER — PREGABALIN 50 MG PO CAPS
50.0000 mg | ORAL_CAPSULE | Freq: Every day | ORAL | Status: DC
  Administered 2016-03-06 – 2016-03-11 (×6): 50 mg via ORAL
  Filled 2016-03-06 (×6): qty 1

## 2016-03-06 MED ORDER — TRAZODONE HCL 50 MG PO TABS
25.0000 mg | ORAL_TABLET | Freq: Every evening | ORAL | Status: DC | PRN
  Administered 2016-03-07 – 2016-03-09 (×2): 25 mg via ORAL
  Filled 2016-03-06 (×2): qty 1

## 2016-03-06 MED ORDER — ESCITALOPRAM OXALATE 10 MG PO TABS
5.0000 mg | ORAL_TABLET | Freq: Every day | ORAL | Status: DC
  Administered 2016-03-06 – 2016-03-11 (×6): 5 mg via ORAL
  Filled 2016-03-06 (×6): qty 1

## 2016-03-06 MED ORDER — PIPERACILLIN-TAZOBACTAM 3.375 G IVPB
3.3750 g | Freq: Three times a day (TID) | INTRAVENOUS | Status: DC
  Administered 2016-03-06 – 2016-03-08 (×5): 3.375 g via INTRAVENOUS
  Filled 2016-03-06 (×7): qty 50

## 2016-03-06 MED ORDER — PANTOPRAZOLE SODIUM 40 MG PO TBEC
40.0000 mg | DELAYED_RELEASE_TABLET | Freq: Every day | ORAL | Status: DC
  Administered 2016-03-06 – 2016-03-11 (×6): 40 mg via ORAL
  Filled 2016-03-06 (×6): qty 1

## 2016-03-06 MED ORDER — ACETAMINOPHEN 650 MG RE SUPP: 650.0000 mg | Freq: Four times a day (QID) | RECTAL | Status: DC | PRN

## 2016-03-06 MED ORDER — VANCOMYCIN HCL 10 G IV SOLR
1250.0000 mg | Freq: Two times a day (BID) | INTRAVENOUS | Status: DC
  Administered 2016-03-07 – 2016-03-08 (×3): 1250 mg via INTRAVENOUS
  Filled 2016-03-06 (×4): qty 1250

## 2016-03-06 MED ORDER — VANCOMYCIN HCL IN DEXTROSE 1-5 GM/200ML-% IV SOLN
1000.0000 mg | Freq: Once | INTRAVENOUS | Status: AC
  Administered 2016-03-06: 1000 mg via INTRAVENOUS
  Filled 2016-03-06: qty 200

## 2016-03-06 MED ORDER — ONDANSETRON HCL 4 MG/2ML IJ SOLN: 4.0000 mg | Freq: Four times a day (QID) | INTRAMUSCULAR | Status: DC | PRN

## 2016-03-06 MED ORDER — INSULIN ASPART 100 UNIT/ML ~~LOC~~ SOLN
0.0000 [IU] | Freq: Three times a day (TID) | SUBCUTANEOUS | Status: DC
  Administered 2016-03-07: 2 [IU] via SUBCUTANEOUS
  Administered 2016-03-07 (×2): 1 [IU] via SUBCUTANEOUS
  Administered 2016-03-08: 2 [IU] via SUBCUTANEOUS
  Administered 2016-03-09: 1 [IU] via SUBCUTANEOUS

## 2016-03-06 MED ORDER — BISACODYL 10 MG RE SUPP: 10.0000 mg | Freq: Every day | RECTAL | Status: DC | PRN

## 2016-03-06 MED ORDER — HYDROCHLOROTHIAZIDE 25 MG PO TABS: 25.0000 mg | ORAL_TABLET | Freq: Every day | ORAL | Status: DC

## 2016-03-06 MED ORDER — GABAPENTIN 300 MG PO CAPS: 300.0000 mg | ORAL_CAPSULE | Freq: Every day | ORAL | Status: DC | PRN

## 2016-03-06 MED ORDER — SENNOSIDES-DOCUSATE SODIUM 8.6-50 MG PO TABS: 1.0000 | ORAL_TABLET | Freq: Every evening | ORAL | Status: DC | PRN

## 2016-03-06 MED ORDER — CLONAZEPAM 0.5 MG PO TABS
0.5000 mg | ORAL_TABLET | Freq: Every day | ORAL | Status: DC
  Administered 2016-03-06 – 2016-03-10 (×5): 0.5 mg via ORAL
  Filled 2016-03-06 (×5): qty 1

## 2016-03-06 MED ORDER — ALBUTEROL SULFATE (2.5 MG/3ML) 0.083% IN NEBU: 2.5000 mg | INHALATION_SOLUTION | Freq: Four times a day (QID) | RESPIRATORY_TRACT | Status: DC | PRN

## 2016-03-06 MED ORDER — SODIUM CHLORIDE 0.9 % IV BOLUS (SEPSIS)
500.0000 mL | Freq: Once | INTRAVENOUS | Status: AC
  Administered 2016-03-06: 500 mL via INTRAVENOUS

## 2016-03-06 MED ORDER — SODIUM CHLORIDE 0.9 % IV SOLN
INTRAVENOUS | Status: AC
  Administered 2016-03-06 – 2016-03-07 (×2): via INTRAVENOUS

## 2016-03-06 MED ORDER — MAGNESIUM CITRATE PO SOLN: 1.0000 | Freq: Once | ORAL | Status: DC | PRN

## 2016-03-06 MED ORDER — PIPERACILLIN-TAZOBACTAM 3.375 G IVPB
3.3750 g | Freq: Once | INTRAVENOUS | Status: AC
  Administered 2016-03-06: 3.375 g via INTRAVENOUS
  Filled 2016-03-06: qty 50

## 2016-03-06 MED ORDER — ONDANSETRON HCL 4 MG PO TABS
4.0000 mg | ORAL_TABLET | Freq: Four times a day (QID) | ORAL | Status: DC | PRN
  Administered 2016-03-09: 4 mg via ORAL
  Filled 2016-03-06: qty 1

## 2016-03-06 MED ORDER — ALBUTEROL SULFATE (2.5 MG/3ML) 0.083% IN NEBU
5.0000 mg | INHALATION_SOLUTION | Freq: Once | RESPIRATORY_TRACT | Status: AC
  Administered 2016-03-06: 5 mg via RESPIRATORY_TRACT
  Filled 2016-03-06: qty 6

## 2016-03-06 MED ORDER — BUSPIRONE HCL 15 MG PO TABS
15.0000 mg | ORAL_TABLET | Freq: Two times a day (BID) | ORAL | Status: DC
  Administered 2016-03-06 – 2016-03-11 (×10): 15 mg via ORAL
  Filled 2016-03-06 (×10): qty 1

## 2016-03-06 MED ORDER — HEPARIN SODIUM (PORCINE) 5000 UNIT/ML IJ SOLN
5000.0000 [IU] | Freq: Three times a day (TID) | INTRAMUSCULAR | Status: DC
  Administered 2016-03-06 – 2016-03-11 (×13): 5000 [IU] via SUBCUTANEOUS
  Filled 2016-03-06 (×10): qty 1

## 2016-03-06 MED ORDER — TRAZODONE HCL 100 MG PO TABS
300.0000 mg | ORAL_TABLET | Freq: Every day | ORAL | Status: DC
  Administered 2016-03-06 – 2016-03-08 (×3): 300 mg via ORAL
  Filled 2016-03-06 (×4): qty 3

## 2016-03-06 MED ORDER — POTASSIUM CHLORIDE CRYS ER 20 MEQ PO TBCR
40.0000 meq | EXTENDED_RELEASE_TABLET | Freq: Two times a day (BID) | ORAL | Status: AC
  Administered 2016-03-06 – 2016-03-09 (×6): 40 meq via ORAL
  Filled 2016-03-06 (×6): qty 2

## 2016-03-06 MED ORDER — SODIUM CHLORIDE 0.9 % IV SOLN: INTRAVENOUS | Status: DC

## 2016-03-06 MED ORDER — ALBUTEROL SULFATE HFA 108 (90 BASE) MCG/ACT IN AERS: 2.0000 | INHALATION_SPRAY | Freq: Four times a day (QID) | RESPIRATORY_TRACT | Status: DC | PRN

## 2016-03-06 MED ORDER — ACETAMINOPHEN 325 MG PO TABS
650.0000 mg | ORAL_TABLET | Freq: Four times a day (QID) | ORAL | Status: DC | PRN
  Administered 2016-03-06 – 2016-03-11 (×9): 650 mg via ORAL
  Filled 2016-03-06 (×10): qty 2

## 2016-03-06 NOTE — H&P (Signed)
History and Physical    Yesenia [name] AVW:[id number] DOB: 21-Dec-1960 DOA: 03/06/2016   PCP: Egbert Garibaldi, NP   Patient coming from:  Home  Chief Complaint: Productive cough  HPI: Yesenia [name] is a 55 y.o. female with medical history significant for obstructive  sleep apnea, status post placement of a tracheostomy site on Aug 2017, complicated by cellulitis in the trach site  requiring hospitalization, CK D, chronic pain-fibromyalgia, hyperlipidemia, hypertension, insomnia, history of stroke with right-sided weakness,morbid obesity, diabetes with polyneuropathy,GERD, discharge from the nursing home on was 15, presenting with increased work of breathing and a productive cough with yellow sputum.she also reported sharp left anterior chest wall pain. On admission, the patient denied any nausea or vomiting no diarrhea. No headaches or vision changes. She denies any sick contacts other than being recently hospitalized. The patient reports of subjective fevers, and chills. She denies any night sweats. She denies any abdominal pain or vomiting. She has intermittent nausea. She denies any worsening leg swelling. She has multiple areas of myalgia, which are chronic.   ED Course:  BP 113/72 (BP Location: Right Arm)   Pulse 65   Temp 99.9 F (37.7 C) (Rectal)   Resp 14   SpO2 99%    chest x-ray with questionable bilateral basilar infiltrates Rectal temperature 99.9. Lactic acid initially at 3, awaiting the next labs Lactic acid is 3 troponin 0.01 Bicarbonate 26 Hemoglobin 11.8 Glucose 110  Review of Systems: As per HPI otherwise 10 point review of systems negative.   Past Medical History:  Diagnosis Date  . Anemia   . Anemia   . Chronic pain   . CKD (chronic kidney disease)   . Diabetes mellitus without complication (HCC)    diet controlled. does not check CBG's  . Fibromyalgia    hospitilized 12/16 due to inability to walk  . Hyperlipemia   . Hypertension   . Insomnia   .  Neuropathy (HCC)   . Right sided weakness   . Stroke [facility]) 2014?   no residual weakness   . Vitamin deficiency    Vit D    Past Surgical History:  Procedure Laterality Date  . KNEE SURGERY  1992, 1995, 1997  . LEG SURGERY Right 2007   Surgery x3 2 arthroscopies and one rod  . OOPHORECTOMY Right 1993?  . TRACHEOSTOMY TUBE PLACEMENT N/A 02/13/2016   Procedure: TRACHEOSTOMY;  Surgeon: Christia Reading, MD;  Location: [facility] OR;  Service: ENT;  Laterality: N/A;    Social History Social History   Social History  . Marital status: Single    Spouse name: N/A  . Number of children: 2  . Years of education: 12th    Occupational History  . [employer]    Social History Main Topics  . Smoking status: Never Smoker  . Smokeless tobacco: Never Used  . Alcohol use No  . Drug use: No  . Sexual activity: No   Other Topics Concern  . Not on file   Social History Narrative   Reports no caffeine use      Allergies  Allergen Reactions  . Buprenorphine Hcl Hives  . Morphine And Related Hives and Dermatitis    Family History  Problem Relation Age of Onset  . Breast cancer Mother   . Leukemia Brother   . Breast cancer Maternal Grandmother       Prior to Admission medications   Medication Sig Start Date End Date Taking? Authorizing Provider  albuterol (PROVENTIL HFA;VENTOLIN HFA) 108 (90  BASE) MCG/ACT inhaler Inhale 2 puffs into the lungs every 6 (six) hours as needed for wheezing.   Yes Historical Provider, MD  bacitracin ointment Apply 1 application topically 2 (two) times daily. To trach site 02/20/16  Yes Christia Readingwight Bates, MD  benzonatate (TESSALON) 100 MG capsule Take 1 capsule (100 mg total) by mouth 3 (three) times daily as needed for cough. 02/25/16  Yes Maryann Mikhail, DO  bisacodyl (DULCOLAX) 5 MG EC tablet Take 1 tablet (5 mg total) by mouth daily as needed for moderate constipation. 02/25/16  Yes Maryann Mikhail, DO  busPIRone (BUSPAR) 15 MG tablet Take 15 mg by mouth 2 (two) times daily.    Yes Historical Provider, MD  clonazePAM (KLONOPIN) 0.5 MG tablet Take 0.5 mg by mouth at bedtime.   Yes Historical Provider, MD  escitalopram (LEXAPRO) 10 MG tablet Take 5 mg by mouth daily. Take 5 mg daily x 5 days then take 10 mg daily   Yes Historical Provider, MD  ferrous sulfate 325 (65 FE) MG tablet Take 325 mg by mouth daily. 09/13/14  Yes Historical Provider, MD  gabapentin (NEURONTIN) 300 MG capsule Take 300 mg by mouth daily as needed. For pain 10/29/15  Yes Historical Provider, MD  hydrochlorothiazide (HYDRODIURIL) 25 MG tablet Take 25 mg by mouth daily. 09/13/14  Yes Historical Provider, MD  HYDROcodone-acetaminophen (NORCO/VICODIN) 5-325 MG tablet Take 1-2 tablets by mouth every 4 (four) hours as needed for moderate pain or severe pain. DO NOT EXCEED 3GM OF APAP IN 24 HOURS FROM ALL SOURCES 03/03/16  Yes Kimber RelicArthur G Green, MD  ondansetron (ZOFRAN) 4 MG tablet Take 4 mg by mouth daily. Patient states she is taking every day per patient   Yes Historical Provider, MD  pantoprazole (PROTONIX) 40 MG tablet Take 1 tablet (40 mg total) by mouth daily. 02/20/16  Yes Christia Readingwight Bates, MD  polyethylene glycol (MIRALAX / GLYCOLAX) packet Take 17 g by mouth daily as needed for mild constipation. 02/25/16  Yes Maryann Mikhail, DO  pregabalin (LYRICA) 50 MG capsule Take 1 capsule (50 mg total) by mouth daily. 03/03/16  Yes Kimber RelicArthur G Green, MD  saccharomyces boulardii (FLORASTOR) 250 MG capsule Take 250 mg by mouth 2 (two) times daily.   Yes Historical Provider, MD  traZODone (DESYREL) 150 MG tablet Take 300 mg by mouth daily. 01/10/16  Yes Historical Provider, MD  cephALEXin (KEFLEX) 500 MG capsule Take 1 capsule (500 mg total) by mouth every 8 (eight) hours. Patient not taking: Reported on 03/06/2016 02/25/16   Edsel PetrinMaryann Mikhail, DO    Physical Exam:    Vitals:   03/06/16 1237 03/06/16 1256 03/06/16 1314 03/06/16 1400  BP: 112/76   113/72  Pulse: 90 92  65  Resp: (!) 29 21  14   Temp:   99.9 F (37.7 C)     TempSrc:   Rectal   SpO2: 97% 100%  99%       Constitutional: moderate degree of discomfort due to increase mucosa in the trach site  Vitals:   03/06/16 1237 03/06/16 1256 03/06/16 1314 03/06/16 1400  BP: 112/76   113/72  Pulse: 90 92  65  Resp: (!) 29 21  14   Temp:   99.9 F (37.7 C)   TempSrc:   Rectal   SpO2: 97% 100%  99%   Eyes: PERRL, lids and conjunctivae normal ENMT: Mucous membranes are moist. Posterior pharynx clear of any exudate or lesions.Normal dentition.  Neck:trach with secretions noted  Respiratory:  Audible bilateral crackles,  no wheezing or rhonchi .increased wob. No accessory muscle use.  Cardiovascular: Regular rate and rhythm, no murmurs / rubs / gallops. No extremity edema. 2+ pedal pulses. No carotid bruits.  Abdomen:  Obese no tenderness, no masses palpated. No hepatosplenomegaly. Bowel sounds positive.  Musculoskeletal: no clubbing / cyanosis. No joint deformity upper and lower extremities. Good ROM, no contractures. Normal muscle tone.  Skin: no rashes, lesions, ulcers.  Neurologic: CN 2-12 grossly intact. Sensation intact, DTR normal. Strength 5/5 in all 4.     Labs on Admission: I have personally reviewed following labs and imaging studies  CBC:  Recent Labs Lab 03/06/16 1300  WBC 7.1  HGB 11.8*  HCT 36.8  MCV 88.0  PLT 396    Basic Metabolic Panel:  Recent Labs Lab 03/06/16 1300  NA 140  K 3.1*  CL 105  CO2 26  GLUCOSE 110*  BUN 16  CREATININE 1.26*  CALCIUM 9.5    GFR: Estimated Creatinine Clearance: 74.5 mL/min (by C-G formula based on SCr of 1.26 mg/dL).  Liver Function Tests: No results for input(s): AST, ALT, ALKPHOS, BILITOT, PROT, ALBUMIN in the last 168 hours. No results for input(s): LIPASE, AMYLASE in the last 168 hours. No results for input(s): AMMONIA in the last 168 hours.  Coagulation Profile: No results for input(s): INR, PROTIME in the last 168 hours.  Cardiac Enzymes: No results for input(s):  CKTOTAL, CKMB, CKMBINDEX, TROPONINI in the last 168 hours.  BNP (last 3 results) No results for input(s): PROBNP in the last 8760 hours.  HbA1C: No results for input(s): HGBA1C in the last 72 hours.  CBG: No results for input(s): GLUCAP in the last 168 hours.  Lipid Profile: No results for input(s): CHOL, HDL, LDLCALC, TRIG, CHOLHDL, LDLDIRECT in the last 72 hours.  Thyroid Function Tests: No results for input(s): TSH, T4TOTAL, FREET4, T3FREE, THYROIDAB in the last 72 hours.  Anemia Panel: No results for input(s): VITAMINB12, FOLATE, FERRITIN, TIBC, IRON, RETICCTPCT in the last 72 hours.  Urine analysis:    Component Value Date/Time   COLORURINE YELLOW 02/16/2016 1310   APPEARANCEUR HAZY (A) 02/16/2016 1310   LABSPEC 1.015 02/16/2016 1310   PHURINE 5.5 02/16/2016 1310   GLUCOSEU NEGATIVE 02/16/2016 1310   HGBUR NEGATIVE 02/16/2016 1310   BILIRUBINUR NEGATIVE 02/16/2016 1310   KETONESUR NEGATIVE 02/16/2016 1310   PROTEINUR NEGATIVE 02/16/2016 1310   UROBILINOGEN 0.2 06/16/2014 0955   NITRITE NEGATIVE 02/16/2016 1310   LEUKOCYTESUR NEGATIVE 02/16/2016 1310    Sepsis Labs: @LABRCNTIP (procalcitonin:4,lacticidven:4) )No results found for this or any previous visit (from the past 240 hour(s)).   Radiological Exams on Admission: Dg Chest Port 1 View  Result Date: 03/06/2016 CLINICAL DATA:  Productive cough, trach. Hx of DM, HTN,stroke. severly SOB. EXAM: PORTABLE CHEST - 1 VIEW COMPARISON:  02/22/2016 CT and previous studies FINDINGS: Tracheostomy stable. Low lung volumes. Patchy interstitial infiltrates or subsegmental atelectasis in the lung bases slightly increased since previous exam. Heart size and mediastinal contours are within normal limits. No effusion. Visualized bones unremarkable. IMPRESSION: Low volumes with some increase in bibasilar atelectasis or infiltrate. Electronically Signed   By: Corlis Leak M.D.   On: 03/06/2016 12:59    EKG: Independently reviewed.    Assessment/Plan Active Problems:   * No active hospital problems. *      Acute respiratory failure without hypoxia in a patient with recent hospitalization for cellulitis at the trach site recently lpaced for sleep apnea , concern for development of  Healthcare associated  pneumonia. Component of tracheobronchitis.   Chest x-ray with possible bilateral infiltrate; WBC normal  lactic acid 3, awaiting new values, bicarb 26 maximum temperature 99.9, was hypotensive on admission, BP at this time is 113/72 Admit to IP medsurg Continue Vanc Zosyn per HCAP order set protocol  -Blood cultures Lactic acid  Incentive spirometry  Respiratory therapy for tracheostomy suction  -Oxygen supplementation and wean as able  Chest wall pain, normal Tn, EKG without ACS  LIkely due to respiratory issues as above. COntinue to monitor    Hypertension BP 113/72 (BP Location: Right Arm)   Pulse 65   Temp 99.9 F (37.7 C) (Rectal)   Resp 14   SpO2 99%  Was low during admission, hold antihypertensives today ,resume tomorrow   Fibromyalgia Stable, continue Lyrica   Type II Diabetes Current blood sugar level is 110 Lab Results  Component Value Date   HGBA1C 6.1 (H) 02/05/2016  Hold home oral diabetic medications.  SSI Heart healthy carb modified diet.  Chronic kidney disease stage 3A , baseline creatinine    1.17, currently at 1.26  Lab Results  Component Value Date   CREATININE 1.26 (H) 03/06/2016   CREATININE 1.11 (H) 02/25/2016   CREATININE 1.17 (H) 02/24/2016  IVCareful use of IV F HCTZ on hold due to borderline BP  Repeat CMET in am  GERD, no acute symptoms: Continue PPI  Anxiety/Depression Continue Klonopin and Lexapro   DVT prophylaxis: Heparin  Code Status:   Full   Family Communication:  Discussed with patien  Disposition Plan: Expect patient to be discharged to home after condition improves Consults called:    Respiratory therapy  Admission status: Inpatient  Medsurg    [facility] E, PA-C [facility]   If 7PM-7AM, please contact night-coverage [email or web address] Password [facility]  03/06/2016, 3:27 PM

## 2016-03-06 NOTE — ED Triage Notes (Signed)
New trach placed on Aug 4th; got infected and in hospital until a week ago. At rehab facility now for a month. Onset of respiratory distress and cough/suction of yellow phlegm about an hour and a half ago. Onset of CP at the same time. Rates 8/10.

## 2016-03-06 NOTE — ED Provider Notes (Signed)
[facility]-EMERGENCY DEPT Provider Note   CSN: [id number] Arrival date & time: 03/06/16  1206     History   Chief Complaint Chief Complaint  Patient presents with  . Respiratory Distress  . Chest Pain    HPI Yesenia [name] is a 55 y.o. female.presents via [facility] from [facility] report of dyspnea, chest pain, yellow sputum from trach site.  Patient states trach placed by ent for sleep apnea.  Patient reports d/c to [facility] 8/15 after admission for tracheitis. Doing ok until today when became sob, coughed yellow sputum from trach site, and had some sharp left anterior chest pain.   HPI  Past Medical History:  Diagnosis Date  . Anemia   . Anemia   . Chronic pain   . CKD (chronic kidney disease)   . Diabetes mellitus without complication (HCC)    diet controlled. does not check CBG's  . Fibromyalgia    hospitilized 12/16 due to inability to walk  . Hyperlipemia   . Hypertension   . Insomnia   . Neuropathy (HCC)   . Right sided weakness   . Stroke [facility]) 2014?   no residual weakness   . Vitamin deficiency    Vit D    Patient Active Problem List   Diagnosis Date Noted  . Fibromyalgia 02/22/2016  . Cellulitis 02/21/2016  . Abdominal pain 02/16/2016  . Constipation 02/16/2016  . CKD (chronic kidney disease) stage 3, GFR 30-59 ml/min 02/16/2016  . Fever   . Cough   . Sleep apnea 02/13/2016  . Insomnia 08/10/2015  . Polyneuropathy (HCC) 07/10/2015  . GERD (gastroesophageal reflux disease) 07/10/2015  . Acute kidney injury superimposed on chronic kidney disease (HCC) 07/07/2015  . Gait disturbance 07/07/2015  . Morbid obesity (HCC) 07/07/2015  . Left hip pain 07/06/2015  . Chronic pain   . Right sided weakness   . Headache(784.0) 04/14/2013  . Encephalopathy acute 04/11/2013  . Acute pyelonephritis 04/11/2013  . Diabetes mellitus, type 2 (HCC) 04/11/2013  . Dyslipidemia 04/11/2013  . Chest pain 02/15/2013  . CVA (cerebral infarction) 01/14/2013  . HTN (hypertension) 01/14/2013  .  Anemia 01/14/2013    Past Surgical History:  Procedure Laterality Date  . KNEE SURGERY  1992, 1995, 1997  . LEG SURGERY Right 2007   Surgery x3 2 arthroscopies and one rod  . OOPHORECTOMY Right 1993?  . TRACHEOSTOMY TUBE PLACEMENT N/A 02/13/2016   Procedure: TRACHEOSTOMY;  Surgeon: Christia Reading, MD;  Location[facility] OR;  Service: ENT;  Laterality: N/A;    OB History    No data available       Home Medications    Prior to Admission medications   Medication Sig Start Date End Date Taking? Authorizing Provider  albuterol (PROVENTIL HFA;VENTOLIN HFA) 108 (90 BASE) MCG/ACT inhaler Inhale 2 puffs into the lungs every 6 (six) hours as needed for wheezing.    Historical Provider, MD  bacitracin ointment Apply 1 application topically 2 (two) times daily. To trach site 02/20/16   Christia Reading, MD  benzonatate (TESSALON) 100 MG capsule Take 1 capsule (100 mg total) by mouth 3 (three) times daily as needed for cough. 02/25/16   Maryann Mikhail, DO  bisacodyl (DULCOLAX) 5 MG EC tablet Take 1 tablet (5 mg total) by mouth daily as needed for moderate constipation. 02/25/16   Maryann Mikhail, DO  cephALEXin (KEFLEX) 500 MG capsule Take 1 capsule (500 mg total) by mouth every 8 (eight) hours. 02/25/16   Maryann Mikhail, DO  ferrous sulfate 325 (65 FE)  MG tablet Take 325 mg by mouth daily. 09/13/14   Historical Provider, MD  gabapentin (NEURONTIN) 300 MG capsule Take 300 mg by mouth daily as needed. For pain 10/29/15   Historical Provider, MD  hydrochlorothiazide (HYDRODIURIL) 25 MG tablet Take 25 mg by mouth daily. 09/13/14   Historical Provider, MD  HYDROcodone-acetaminophen (NORCO/VICODIN) 5-325 MG tablet Take 1-2 tablets by mouth every 4 (four) hours as needed for moderate pain or severe pain. DO NOT EXCEED 3GM OF APAP IN 24 HOURS FROM ALL SOURCES 03/03/16   Kimber Relic, MD  ondansetron (ZOFRAN) 4 MG tablet Take 4 mg by mouth daily. Patient states she is taking every day per patient    Historical Provider, MD    pantoprazole (PROTONIX) 40 MG tablet Take 1 tablet (40 mg total) by mouth daily. 02/20/16   Christia Reading, MD  polyethylene glycol [facility]) packet Take 17 g by mouth daily as needed for mild constipation. 02/25/16   Maryann Mikhail, DO  pregabalin (LYRICA) 50 MG capsule Take 1 capsule (50 mg total) by mouth daily. 03/03/16   Kimber Relic, MD  saccharomyces boulardii (FLORASTOR) 250 MG capsule Take 250 mg by mouth 2 (two) times daily.    Historical Provider, MD  traZODone (DESYREL) 150 MG tablet Take 300 mg by mouth daily. 01/10/16   Historical Provider, MD    Family History Family History  Problem Relation Age of Onset  . Breast cancer Mother   . Leukemia Brother   . Breast cancer Maternal Grandmother     Social History Social History  Substance Use Topics  . Smoking status: Never Smoker  . Smokeless tobacco: Never Used  . Alcohol use No     Allergies   Buprenorphine hcl and Morphine and related   Review of Systems Review of Systems  All other systems reviewed and are negative.    Physical Exam Updated Vital Signs SpO2 95% Comment: 10L NRB; 70's on RA  Physical Exam  Constitutional: She is oriented to person, place, and time. She appears well-developed and well-nourished. She appears distressed.  Morbidly obese  HENT:  Head: Normocephalic and atraumatic.  Right Ear: External ear normal.  Left Ear: External ear normal.  Mouth/Throat: Oropharynx is clear and moist.  Eyes: EOM are normal. Pupils are equal, round, and reactive to light.  Neck:  Trach site with mild erythema inferiorly.  No discharge or bleeding.   Cardiovascular: Normal rate and regular rhythm.   Pulmonary/Chest: Effort normal.  Decreased bs bilaterally with increased wob and rhonchi at right base  Abdominal: Soft. Bowel sounds are normal.  Musculoskeletal: She exhibits edema.  Neurological: She is alert and oriented to person, place, and time. She has normal reflexes. No cranial nerve  deficit. Coordination normal.  Skin: Skin is warm and dry. Capillary refill takes less than 2 seconds.  Psychiatric: She has a normal mood and affect.  Nursing note and vitals reviewed.    ED Treatments / Results  Labs (all labs ordered are listed, but only abnormal results are displayed) Labs Reviewed - No data to display  EKG  EKG Interpretation  Date/Time:  Saturday March 06 2016 12:15:57 EDT Ventricular Rate:  88 PR Interval:    QRS Duration: 105 QT Interval:  375 QTC Calculation: 454 R Axis:   20 Text Interpretation:  Normal sinus rhythm Left ventricular hypertrophy Non-specific ST-t changes v1 and v2 Confirmed by Nichoel Digiulio MD, Duwayne Heck ([id number]) on 03/06/2016 12:21:59 PM       Radiology No  results found.  Procedures Procedures (including critical care time)  Medications Ordered in ED Medications - No data to display   Initial Impression / Assessment and Plan / ED Course  I have reviewed the triage vital signs and the nursing notes.  Pertinent labs & imaging results that were available during my care of the patient were reviewed by me and considered in my medical decision making (see chart for details).  Clinical Course    55 y.o. Female with respiratory distress- increased wob and productive cough with trach, cxr wit ?bibasilar infiltrates.  Rectal temp 99.9.  Lactic initially elevated at 3 with bordeline hypotension now improved to 113/72.  Patient given abx to cover hcap. Mild hypokalemia- likely secondary to  Hyperventilation. Will need recheck AKI with creatining 1.26 with last 1.11.Plan hydration - recheck.    Final Clinical Impressions(s) / ED Diagnoses   Final diagnoses:  HCAP (healthcare-associated pneumonia)    New Prescriptions New Prescriptions   No medications on file  Discussed with Ms. Wilmon PaliGuenther and plan admission to med surg obs.    Margarita Grizzleanielle Tamela Elsayed, MD 03/06/16 [phone]

## 2016-03-06 NOTE — Progress Notes (Signed)
Sputum culture done and sent to lab

## 2016-03-06 NOTE — Progress Notes (Addendum)
Pharmacy Antibiotic Note  Yesenia [name] is a 55 y.o. female with recent hospitalization for cellulitis at her trach site returns to the ED today with respiratory distress. CXR with possible infiltrates. She will begin vancomycin and zosyn to cover for HCAP. Renal function wnl.  Vancomycin trough goal 15-20  Plan: 1) Give additional vancomycin 1g to complete 2g load then 1250mg  IV q12 2) Zosyn 3.375g IV q8 (4 hour infusion) 3) Follow renal function, cultures, LOT, level as indicated     Temp (24hrs), Avg:99.3 F (37.4 C), Min:98.6 F (37 C), Max:99.9 F (37.7 C)   Recent Labs Lab 03/06/16 1300 03/06/16 1323  WBC 7.1  --   CREATININE 1.26*  --   LATICACIDVEN  --  3.01*    Estimated Creatinine Clearance: 74.5 mL/min (by C-G formula based on SCr of 1.26 mg/dL).    Allergies  Allergen Reactions  . Buprenorphine Hcl Hives  . Morphine And Related Hives and Dermatitis    Antimicrobials this admission: 8/26 Vancomycin >> 8/26 Zosyn >>  Dose adjustments this admission: n/a  Microbiology results: 8/26 Blood x 2>>  Thank you for allowing pharmacy to be a part of this patient's care.  Fredrik RiggerMarkle, Damia Bobrowski Sue 03/06/2016 4:11 PM

## 2016-03-06 NOTE — ED Notes (Signed)
Respiratory at bedside for treatment and evaluation of trach.

## 2016-03-07 ENCOUNTER — Inpatient Hospital Stay (HOSPITAL_COMMUNITY): Payer: Medicaid Other

## 2016-03-07 DIAGNOSIS — G4730 Sleep apnea, unspecified: Secondary | ICD-10-CM

## 2016-03-07 LAB — COMPREHENSIVE METABOLIC PANEL
ALBUMIN: 2.9 g/dL — AB (ref 3.5–5.0)
ALT: 16 U/L (ref 14–54)
ANION GAP: 6 (ref 5–15)
AST: 16 U/L (ref 15–41)
Alkaline Phosphatase: 60 U/L (ref 38–126)
BILIRUBIN TOTAL: 0.6 mg/dL (ref 0.3–1.2)
BUN: 13 mg/dL (ref 6–20)
CHLORIDE: 108 mmol/L (ref 101–111)
CO2: 27 mmol/L (ref 22–32)
Calcium: 8.8 mg/dL — ABNORMAL LOW (ref 8.9–10.3)
Creatinine, Ser: 1.26 mg/dL — ABNORMAL HIGH (ref 0.44–1.00)
GFR calc Af Amer: 55 mL/min — ABNORMAL LOW (ref >60)
GFR, EST NON AFRICAN AMERICAN: 47 mL/min — AB (ref >60)
GLUCOSE: 127 mg/dL — AB (ref 65–99)
POTASSIUM: 3.4 mmol/L — AB (ref 3.5–5.1)
Sodium: 141 mmol/L (ref 135–145)
TOTAL PROTEIN: 6.5 g/dL (ref 6.5–8.1)

## 2016-03-07 LAB — CBC
HEMATOCRIT: 32.9 % — AB (ref 36.0–46.0)
HEMOGLOBIN: 10.3 g/dL — AB (ref 12.0–15.0)
MCH: 27.7 pg (ref 26.0–34.0)
MCHC: 31.3 g/dL (ref 30.0–36.0)
MCV: 88.4 fL (ref 78.0–100.0)
Platelets: 311 10*3/uL (ref 150–400)
RBC: 3.72 MIL/uL — ABNORMAL LOW (ref 3.87–5.11)
RDW: 14.3 % (ref 11.5–15.5)
WBC: 6.4 10*3/uL (ref 4.0–10.5)

## 2016-03-07 LAB — STREP PNEUMONIAE URINARY ANTIGEN: Strep Pneumo Urinary Antigen: NEGATIVE

## 2016-03-07 LAB — PROTIME-INR
INR: 1.26
Prothrombin Time: 15.8 seconds — ABNORMAL HIGH (ref 11.4–15.2)

## 2016-03-07 LAB — GLUCOSE, CAPILLARY
GLUCOSE-CAPILLARY: 120 mg/dL — AB (ref 65–99)
Glucose-Capillary: 143 mg/dL — ABNORMAL HIGH (ref 65–99)
Glucose-Capillary: 140 mg/dL — ABNORMAL HIGH (ref 65–99)
Glucose-Capillary: 153 mg/dL — ABNORMAL HIGH (ref 65–99)

## 2016-03-07 MED ORDER — ORAL CARE MOUTH RINSE
15.0000 mL | Freq: Two times a day (BID) | OROMUCOSAL | Status: DC
  Administered 2016-03-08 – 2016-03-11 (×7): 15 mL via OROMUCOSAL

## 2016-03-07 MED ORDER — CHLORHEXIDINE GLUCONATE 0.12 % MT SOLN
15.0000 mL | Freq: Two times a day (BID) | OROMUCOSAL | Status: DC
  Administered 2016-03-07 – 2016-03-10 (×7): 15 mL via OROMUCOSAL
  Filled 2016-03-07 (×7): qty 15

## 2016-03-07 NOTE — Evaluation (Signed)
Physical Therapy Evaluation Patient Details Name: Yesenia [name] MRN: [id number] DOB: Aug 16, 1960 Today's Date: 03/07/2016   History of Present Illness  55 y.o. female with medical history significant for obstructive  sleep apnea, status post placement of a tracheostomy site on Aug 2017, complicated by cellulitis in the trach site  requiring hospitalization, CK D, chronic pain-fibromyalgia, hyperlipidemia, hypertension, insomnia, history of stroke with right-sided weakness,morbid obesity, diabetes with polyneuropathy,GERD, discharge from the nursing home on was 15, presenting with increased work of breathing and a productive cough with yellow sputum  Clinical Impression  Pt was previously seen by Mobility team during prior hospital admission.  She reports she began ST at SNF but did not have opportunity to begin PT at SNF prior to return to hospital.  She needs incr assistance this hospitalization compared to last regarding her mobility.  Will continue to follow patient while on this venue of care to progress mobility. Patient may benefit from further skilled PT to improve mobility, reduce fall risk and incr functional independence.    Follow Up Recommendations SNF;Supervision for mobility/OOB    Equipment Recommendations  None recommended by PT    Recommendations for Other Services       Precautions / Restrictions Precautions Precautions: None Precaution Comments: oxygen dependent Restrictions Weight Bearing Restrictions: No      Mobility  Bed Mobility               General bed mobility comments: out of bed upon arrival to room  Transfers Overall transfer level: Needs assistance Equipment used: None Transfers: Sit to/from Stand;Stand Pivot Transfers Sit to Stand: Supervision Stand pivot transfers: Supervision       General transfer comment: supervision to manage equipment in room  Ambulation/Gait Ambulation/Gait assistance: Min assist Ambulation Distance (Feet): 80  Feet Assistive device: 1 person hand held assist Gait Pattern/deviations: Decreased step length - right;Decreased step length - left;Wide base of support     General Gait Details: incr lateral weight shift during gait, follow with w/c for safety  Stairs            Wheelchair Mobility    Modified Rankin (Stroke Patients Only)       Balance Overall balance assessment: Needs assistance Sitting-balance support: Feet supported;No upper extremity supported Sitting balance-Leahy Scale: Good     Standing balance support: Single extremity supported Standing balance-Leahy Scale: Fair                               Pertinent Vitals/Pain Pain Assessment: No/denies pain    Home Living Family/patient expects to be discharged to:: Private residence Living Arrangements: Alone Available Help at Discharge: Family;Available PRN/intermittently Type of Home: Apartment Home Access: Stairs to enter Entrance Stairs-Rails: Doctor, general practice of Steps: 12 Home Layout: One level Home Equipment: Environmental consultant - 2 wheels Additional Comments: admitted from SNF, did not begin PT yet    Prior Function Level of Independence: Independent         Comments: independent with household chores except laundry (on separate floor)     Hand Dominance        Extremity/Trunk Assessment   Upper Extremity Assessment: Overall WFL for tasks assessed           Lower Extremity Assessment: Overall WFL for tasks assessed (hip flexion 4+/5 bil, knee ext 4+/5, ankle DF 4+/5 bil)      Cervical / Trunk Assessment: Normal  Communication   Communication: Tracheostomy;No difficulties  Cognition Arousal/Alertness: Awake/alert Behavior During Therapy: WFL for tasks assessed/performed Overall Cognitive Status: Within Functional Limits for tasks assessed                      General Comments General comments (skin integrity, edema, etc.): 35% supplemental oyxgen via  trach, post gait HR 68 bpm, 100% oxygen saturation    Exercises        Assessment/Plan    PT Assessment Patient needs continued PT services  PT Diagnosis Difficulty walking;Acute pain;Generalized weakness   PT Problem List Decreased activity tolerance;Decreased balance;Decreased mobility;Cardiopulmonary status limiting activity;Obesity  PT Treatment Interventions DME instruction;Gait training;Stair training;Functional mobility training;Therapeutic activities;Therapeutic exercise;Balance training;Neuromuscular re-education;Patient/family education   PT Goals (Current goals can be found in the Care Plan section) Acute Rehab PT Goals Patient Stated Goal: regain stamina PT Goal Formulation: With patient Time For Goal Achievement: 03/19/16 Potential to Achieve Goals: Good    Frequency Min 3X/week   Barriers to discharge Inaccessible home environment;Decreased caregiver support was at SNF prior to admission although did not begin PT yet before returning to hospital    Co-evaluation               End of Session Equipment Utilized During Treatment: Oxygen Activity Tolerance: Patient tolerated treatment well;Patient limited by lethargy Patient left: in chair;with call bell/phone within reach Nurse Communication: Mobility status         Time: [id number] PT Time Calculation (min) (ACUTE ONLY): 32 min   Charges:   PT Evaluation $PT Eval Low Complexity: 1 Procedure PT Treatments $Gait Training: 8-22 mins   PT G CodesNestor Lewandowsky:       Luisdavid Hamblin M Bensen [place], PT [phone]  Dearius Hoffmann 03/07/2016, 1:54 PM

## 2016-03-07 NOTE — Progress Notes (Signed)
PROGRESS NOTE                                                                                                                                                                                                             Patient Demographics:    Yesenia [name], is a 55 y.o. female, DOB - 1960-10-01, WUJ:[id number]  Admit date - 03/06/2016   Admitting Physician Esperanza SheetsUlugbek N Buriev, MD  Outpatient Primary MD for the patient is [name], Joelene MillinKIMBERLY M, NP  LOS - 1  Chief Complaint  Patient presents with  . Respiratory Distress  . Chest Pain       Brief Narrative   55 y/o female with PMH of HTN, HPL, CVA, CKD, OSA recent tracheostomy placement, complicated with cellulitis of trach site,  presented with productive cough, shortness of breath. Found to have pneumonia and tracheobronchitis with acute respiratory failure with mild hypoxia.     Subjective:    Yesenia [name] today has, No headache, No chest pain, No abdominal pain - No Nausea, No new weakness tingling or numbness, ImprovedCough - SOB.     Assessment  & Plan :      1. Acute hypoxic respiratory failure due to early tracheobronchitis. Tracheostomy placed a few weeks ago by Dr. Jenne PaneBates [facility] for sleep apnea, chest x-ray stable, patient is now afebrile and symptoms have much improved, she is not short of breath this morning, uses trach collar at baseline 24 7 which will be continued, continue empiric antibiotic, follow cultures, supportive care with oxygen nebulizer treatment supplementation. If better likely discharge in 1-2 days on oral antibiotics. We'll have her follow with Dr. Jenne PaneBates. Continue routine trach care.  2. CK D stage III. Creatinine at baseline.  3. Hypokalemia. Replaced.  4. Morbid obesity with obstructive sleep apnea and obesity related hypoventilation syndrome. Status post tracheostomy, follow with PCP for weight loss.  5. GERD. On PPI.  6. DM type II.  Continue home regimen and sliding scale.  Lab Results  Component Value Date   HGBA1C 6.1 (H) 02/05/2016   CBG (last 3)   Recent Labs  03/07/16 0817  GLUCAP 143*      Family Communication  :  none  Code Status :  Full  Diet : Heart Healthy Low Carb  Disposition Plan  :  Home 1-2 days  Consults  :  None  Procedures  :     DVT Prophylaxis  :  Heparin   Lab Results  Component Value Date   PLT 311 03/07/2016    Inpatient Medications  Scheduled Meds: . busPIRone  15 mg Oral BID  . clonazePAM  0.5 mg Oral QHS  . escitalopram  5 mg Oral Daily  . heparin  5,000 Units Subcutaneous Q8H  . insulin aspart  0-9 Units Subcutaneous TID WC  . pantoprazole  40 mg Oral Daily  . piperacillin-tazobactam (ZOSYN)  IV  3.375 g Intravenous Q8H  . potassium chloride  40 mEq Oral BID  . pregabalin  50 mg Oral Daily  . saccharomyces boulardii  250 mg Oral BID  . traZODone  300 mg Oral Daily  . vancomycin  1,250 mg Intravenous Q12H   Continuous Infusions: . sodium chloride 75 mL/hr at 03/06/16 1846   PRN Meds:.acetaminophen **OR** acetaminophen, albuterol, bisacodyl, gabapentin, magnesium citrate, ondansetron **OR** ondansetron (ZOFRAN) IV, senna-docusate, traZODone  Antibiotics  :    Anti-infectives    Start     Dose/Rate Route Frequency Ordered Stop   03/07/16 0500  vancomycin (VANCOCIN) 1,250 mg in sodium chloride 0.9 % 250 mL IVPB     1,250 mg 166.7 mL/hr over 90 Minutes Intravenous Every 12 hours 03/06/16 1617     03/06/16 2200  piperacillin-tazobactam (ZOSYN) IVPB 3.375 g     3.375 g 12.5 mL/hr over 240 Minutes Intravenous Every 8 hours 03/06/16 1617     03/06/16 1700  vancomycin (VANCOCIN) IVPB 1000 mg/200 mL premix     1,000 mg 200 mL/hr over 60 Minutes Intravenous  Once 03/06/16 1617 03/06/16 1947   03/06/16 1315  piperacillin-tazobactam (ZOSYN) IVPB 3.375 g     3.375 g 12.5 mL/hr over 240 Minutes Intravenous  Once 03/06/16 1309 03/06/16 1450   03/06/16 1315   vancomycin (VANCOCIN) IVPB 1000 mg/200 mL premix     1,000 mg 200 mL/hr over 60 Minutes Intravenous  Once 03/06/16 1309 03/06/16 1520         Objective:   Vitals:   03/06/16 2036 03/07/16 0512 03/07/16 0752 03/07/16 0923  BP:  104/60  98/64  Pulse: 89 72 76 78  Resp: 16 18 18 18   Temp:  98.4 F (36.9 C)  98.7 F (37.1 C)  TempSrc:  Oral  Oral  SpO2: 100% 95% 96% 96%  Weight:      Height:        Wt Readings from Last 3 Encounters:  03/06/16 (!) 144.5 kg (318 lb 9 oz)  03/01/16 (!) 142.4 kg (314 lb)  02/23/16 (!) 144.8 kg (319 lb 3.6 oz)     Intake/Output Summary (Last 24 hours) at 03/07/16 0948 Last data filed at 03/07/16 0900  Gross per 24 hour  Intake           1562.5 ml  Output                1 ml  Net           1561.5 ml     Physical Exam  Awake Alert, Oriented X 3, No new F.N deficits, Normal affect [place].AT,PERRAL Supple Neck,No JVD, No cervical lymphadenopathy appriciated. Trach site appears stable, Symmetrical Chest wall movement, Good air movement bilaterally, CTAB RRR,No Gallops,Rubs or new Murmurs, No Parasternal Heave +ve B.Sounds, Abd Soft, No tenderness, No organomegaly appriciated, No rebound - guarding or rigidity. No Cyanosis, Clubbing or edema, No new Rash or bruise  Data Review:    CBC  Recent Labs Lab 03/06/16 1300 03/07/16 0516  WBC 7.1 6.4  HGB 11.8* 10.3*  HCT 36.8 32.9*  PLT 396 311  MCV 88.0 88.4  MCH 28.2 27.7  MCHC 32.1 31.3  RDW 14.1 14.3    Chemistries   Recent Labs Lab 03/06/16 1300 03/07/16 0516  NA 140 141  K 3.1* 3.4*  CL 105 108  CO2 26 27  GLUCOSE 110* 127*  BUN 16 13  CREATININE 1.26* 1.26*  CALCIUM 9.5 8.8*  AST  --  16  ALT  --  16  ALKPHOS  --  60  BILITOT  --  0.6   ------------------------------------------------------------------------------------------------------------------ No results for input(s): CHOL, HDL, LDLCALC, TRIG, CHOLHDL, LDLDIRECT in the last 72 hours.  Lab Results    Component Value Date   HGBA1C 6.1 (H) 02/05/2016   ------------------------------------------------------------------------------------------------------------------ No results for input(s): TSH, T4TOTAL, T3FREE, THYROIDAB in the last 72 hours.  Invalid input(s): FREET3 ------------------------------------------------------------------------------------------------------------------ No results for input(s): VITAMINB12, FOLATE, FERRITIN, TIBC, IRON, RETICCTPCT in the last 72 hours.  Coagulation profile  Recent Labs Lab 03/07/16 0516  INR 1.26     Recent Labs  03/06/16 1300  DDIMER 0.36    Cardiac Enzymes No results for input(s): CKMB, TROPONINI, MYOGLOBIN in the last 168 hours.  Invalid input(s): CK ------------------------------------------------------------------------------------------------------------------ No results found for: BNP  Micro Results Recent Results (from the past 240 hour(s))  Culture, respiratory (NON-Expectorated)     Status: None (Preliminary result)   Collection Time: 03/06/16 10:56 PM  Result Value Ref Range Status   Specimen Description TRACHEAL ASPIRATE  Final   Special Requests NONE  Final   Gram Stain   Final    FEW WBC PRESENT, PREDOMINANTLY PMN RARE GRAM POSITIVE RODS    Culture PENDING  Incomplete   Report Status PENDING  Incomplete    Radiology Reports Dg Chest 2 View  Result Date: 03/07/2016 CLINICAL DATA:  Pneumonia EXAM: CHEST  2 VIEW COMPARISON:  03/06/2016 FINDINGS: Tracheostomy to unchanged. Normal cardiac silhouette. Low lung volumes and mild basilar atelectasis. Lungs are clear. IMPRESSION: No acute cardiopulmonary findings. Electronically Signed   By: Genevive Bi M.D.   On: 03/07/2016 08:18   Dg Chest Port 1 View  Result Date: 03/06/2016 CLINICAL DATA:  Productive cough, trach. Hx of DM, HTN,stroke. severly SOB. EXAM: PORTABLE CHEST - 1 VIEW COMPARISON:  02/22/2016 CT and previous studies FINDINGS: Tracheostomy  stable. Low lung volumes. Patchy interstitial infiltrates or subsegmental atelectasis in the lung bases slightly increased since previous exam. Heart size and mediastinal contours are within normal limits. No effusion. Visualized bones unremarkable. IMPRESSION: Low volumes with some increase in bibasilar atelectasis or infiltrate. Electronically Signed   By: Corlis Leak M.D.   On: 03/06/2016 12:59      Time Spent in minutes  30   Max Romano K M.D on 03/07/2016 at 9:48 AM  Between 7am to 7pm - Pager - [phone]  After 7pm go to [email or web address] - password [facility]  [facility] -  Office  [phone]

## 2016-03-08 LAB — BASIC METABOLIC PANEL
Anion gap: 7 (ref 5–15)
BUN: 13 mg/dL (ref 6–20)
CHLORIDE: 110 mmol/L (ref 101–111)
CO2: 26 mmol/L (ref 22–32)
CREATININE: 1.28 mg/dL — AB (ref 0.44–1.00)
Calcium: 9 mg/dL (ref 8.9–10.3)
GFR calc Af Amer: 54 mL/min — ABNORMAL LOW (ref >60)
GFR calc non Af Amer: 47 mL/min — ABNORMAL LOW (ref >60)
Glucose, Bld: 122 mg/dL — ABNORMAL HIGH (ref 65–99)
POTASSIUM: 3.8 mmol/L (ref 3.5–5.1)
SODIUM: 143 mmol/L (ref 135–145)

## 2016-03-08 LAB — GLUCOSE, CAPILLARY
GLUCOSE-CAPILLARY: 117 mg/dL — AB (ref 65–99)
GLUCOSE-CAPILLARY: 88 mg/dL (ref 65–99)
GLUCOSE-CAPILLARY: 138 mg/dL — AB (ref 65–99)
Glucose-Capillary: 173 mg/dL — ABNORMAL HIGH (ref 65–99)

## 2016-03-08 LAB — LEGIONELLA PNEUMOPHILA SEROGP 1 UR AG: L. pneumophila Serogp 1 Ur Ag: NEGATIVE

## 2016-03-08 LAB — MAGNESIUM: MAGNESIUM: 1.9 mg/dL (ref 1.7–2.4)

## 2016-03-08 MED ORDER — BACITRACIN ZINC 500 UNIT/GM EX OINT
TOPICAL_OINTMENT | Freq: Three times a day (TID) | CUTANEOUS | Status: DC | PRN
  Filled 2016-03-08: qty 28.35

## 2016-03-08 MED ORDER — DEXTROSE 5 % IV SOLN
2.0000 g | Freq: Two times a day (BID) | INTRAVENOUS | Status: DC
  Administered 2016-03-08 – 2016-03-09 (×4): 2 g via INTRAVENOUS
  Filled 2016-03-08 (×6): qty 2

## 2016-03-08 NOTE — Progress Notes (Signed)
PROGRESS NOTE                                                                                                                                                                                                             Patient Demographics:    Yesenia [name], is a 55 y.o. female, DOB - September 23, 1960, WUJ:[id number]  Admit date - 03/06/2016   Admitting Physician Esperanza Sheets, MD  Outpatient Primary MD for the patient is [name], [name], NP  LOS - 2  Chief Complaint  Patient presents with  . Respiratory Distress  . Chest Pain       Brief Narrative   55 y/o female with PMH of HTN, HPL, CVA, CKD, OSA recent tracheostomy placement, complicated with cellulitis of trach site,  presented with productive cough, shortness of breath. Found to have pneumonia and tracheobronchitis with acute respiratory failure with mild hypoxia.     Subjective:    Yesenia [name] today has, No headache, No chest pain, No abdominal pain - No Nausea, No new weakness tingling or numbness, ImprovedCough - SOB.     Assessment  & Plan :     1. Acute hypoxic respiratory failure due to early tracheobronchitis. Tracheostomy placed a few weeks ago by Dr. Jenne Pane ENT for sleep apnea, chest x-ray stable, patient is now afebrile Antibiotics adjusted based on cultures, will request Dr. Jenne Pane to evaluate as well, still has lot of secretions from her trach site will have respiratory therapist suctioned as needed. Continue routine trach care.  2. CK D [name] III. Creatinine at baseline.  3. Hypokalemia. Replaced and stable.  4. Morbid obesity with obstructive sleep apnea and obesity related hypoventilation syndrome. Status post tracheostomy, follow with PCP for weight loss.  5. GERD. On PPI.  6. DM type II. Continue home regimen and sliding scale.  Lab Results  Component Value Date   HGBA1C 6.1 (H) 02/05/2016   CBG (last 3)   Recent Labs  03/07/16 1639 03/07/16 1957 03/08/16 0806  GLUCAP 153* 120* 117*      Family Communication  :  none  Code Status :  Full  Diet : Heart Healthy Low Carb  Disposition Plan  :  Home 1-2 days  Consults  :  ENT  Procedures  :     DVT Prophylaxis  :  Heparin   Lab Results  Component Value Date   PLT 311 03/07/2016    Inpatient Medications  Scheduled Meds: . busPIRone  15 mg Oral BID  . chlorhexidine  15 mL Mouth Rinse BID  . clonazePAM  0.5 mg Oral QHS  . escitalopram  5 mg Oral Daily  . heparin  5,000 Units Subcutaneous Q8H  . insulin aspart  0-9 Units Subcutaneous TID WC  . mouth rinse  15 mL Mouth Rinse q12n4p  . pantoprazole  40 mg Oral Daily  . potassium chloride  40 mEq Oral BID  . pregabalin  50 mg Oral Daily  . saccharomyces boulardii  250 mg Oral BID  . traZODone  300 mg Oral Daily   Continuous Infusions:   PRN Meds:.acetaminophen **OR** acetaminophen, albuterol, bacitracin, bisacodyl, gabapentin, magnesium citrate, ondansetron **OR** ondansetron (ZOFRAN) IV, senna-docusate, traZODone  Antibiotics  :    Anti-infectives    Start     Dose/Rate Route Frequency Ordered Stop   03/07/16 0500  vancomycin (VANCOCIN) 1,250 mg in sodium chloride 0.9 % 250 mL IVPB  Status:  Discontinued     1,250 mg 166.7 mL/hr over 90 Minutes Intravenous Every 12 hours 03/06/16 1617 03/08/16 1008   03/06/16 2200  piperacillin-tazobactam (ZOSYN) IVPB 3.375 g  Status:  Discontinued     3.375 g 12.5 mL/hr over 240 Minutes Intravenous Every 8 hours 03/06/16 1617 03/08/16 1008   03/06/16 1700  vancomycin (VANCOCIN) IVPB 1000 mg/200 mL premix     1,000 mg 200 mL/hr over 60 Minutes Intravenous  Once 03/06/16 1617 03/06/16 1947   03/06/16 1315  piperacillin-tazobactam (ZOSYN) IVPB 3.375 g     3.375 g 12.5 mL/hr over 240 Minutes Intravenous  Once 03/06/16 1309 03/06/16 1450   03/06/16 1315  vancomycin (VANCOCIN) IVPB 1000 mg/200 mL premix     1,000 mg 200 mL/hr over 60 Minutes  Intravenous  Once 03/06/16 1309 03/06/16 1520         Objective:   Vitals:   03/07/16 2012 03/08/16 0027 03/08/16 0500 03/08/16 0900  BP:   107/67 119/81  Pulse: 80  68 67  Resp: 16  16 18   Temp:   98.3 F (36.8 C) 98.3 F (36.8 C)  TempSrc:   Oral Oral  SpO2: 99%  99% 95%  Weight:  (!) 144 kg (317 lb 7.4 oz)    Height:        Wt Readings from Last 3 Encounters:  03/08/16 (!) 144 kg (317 lb 7.4 oz)  03/01/16 (!) 142.4 kg (314 lb)  02/23/16 (!) 144.8 kg (319 lb 3.6 oz)     Intake/Output Summary (Last 24 hours) at 03/08/16 1011 Last data filed at 03/08/16 0900  Gross per 24 hour  Intake             1475 ml  Output                0 ml  Net             1475 ml     Physical Exam  Awake Alert, Oriented X 3, No new F.N deficits, Normal affect Yesenia [name].AT,PERRAL Supple Neck,No JVD, No cervical lymphadenopathy appriciated. Trach site appears stable but + secretions Symmetrical Chest wall movement, Good air movement bilaterally, CTAB RRR,No Gallops,Rubs or new Murmurs, No Parasternal Heave +ve B.Sounds, Abd Soft, No tenderness, No organomegaly appriciated, No rebound - guarding or rigidity. No Cyanosis, Clubbing or edema, No new Rash or bruise  Data Review:    CBC  Recent Labs Lab 03/06/16 1300 03/07/16 0516  WBC 7.1 6.4  HGB 11.8* 10.3*  HCT 36.8 32.9*  PLT 396 311  MCV 88.0 88.4  MCH 28.2 27.7  MCHC 32.1 31.3  RDW 14.1 14.3    Chemistries   Recent Labs Lab 03/06/16 1300 03/07/16 0516 03/08/16 0538  NA 140 141 143  K 3.1* 3.4* 3.8  CL 105 108 110  CO2 26 27 26   GLUCOSE 110* 127* 122*  BUN 16 13 13   CREATININE 1.26* 1.26* 1.28*  CALCIUM 9.5 8.8* 9.0  MG  --   --  1.9  AST  --  16  --   ALT  --  16  --   ALKPHOS  --  60  --   BILITOT  --  0.6  --    ------------------------------------------------------------------------------------------------------------------ No results for input(s): CHOL, HDL, LDLCALC, TRIG, CHOLHDL, LDLDIRECT in the  last 72 hours.  Lab Results  Component Value Date   HGBA1C 6.1 (H) 02/05/2016   ------------------------------------------------------------------------------------------------------------------ No results for input(s): TSH, T4TOTAL, T3FREE, THYROIDAB in the last 72 hours.  Invalid input(s): FREET3 ------------------------------------------------------------------------------------------------------------------ No results for input(s): VITAMINB12, FOLATE, FERRITIN, TIBC, IRON, RETICCTPCT in the last 72 hours.  Coagulation profile  Recent Labs Lab 03/07/16 0516  INR 1.26     Recent Labs  03/06/16 1300  DDIMER 0.36    Cardiac Enzymes No results for input(s): CKMB, TROPONINI, MYOGLOBIN in the last 168 hours.  Invalid input(s): CK ------------------------------------------------------------------------------------------------------------------ No results found for: BNP  Micro Results Recent Results (from the past 240 hour(s))  Blood culture (routine x 2)     Status: None (Preliminary result)   Collection Time: 03/06/16  1:51 PM  Result Value Ref Range Status   Specimen Description BLOOD RIGHT ANTECUBITAL  Final   Special Requests BOTTLES DRAWN AEROBIC AND ANAEROBIC 5CC  Final   Culture NO GROWTH < 24 HOURS  Final   Report Status PENDING  Incomplete  Blood culture (routine x 2)     Status: None (Preliminary result)   Collection Time: 03/06/16  2:17 PM  Result Value Ref Range Status   Specimen Description BLOOD LEFT ANTECUBITAL  Final   Special Requests BOTTLES DRAWN AEROBIC AND ANAEROBIC 5CC  Final   Culture NO GROWTH < 24 HOURS  Final   Report Status PENDING  Incomplete  Culture, respiratory (NON-Expectorated)     Status: None (Preliminary result)   Collection Time: 03/06/16 10:56 PM  Result Value Ref Range Status   Specimen Description TRACHEAL ASPIRATE  Final   Special Requests NONE  Final   Gram Stain   Final    FEW WBC PRESENT, PREDOMINANTLY PMN RARE GRAM  POSITIVE RODS    Culture FEW ENTEROBACTER AEROGENES  Final   Report Status PENDING  Incomplete   Organism ID, Bacteria ENTEROBACTER AEROGENES  Final      Susceptibility   Enterobacter aerogenes - MIC*    CEFAZOLIN >=64 RESISTANT Resistant     CEFEPIME <=1 SENSITIVE Sensitive     CEFTAZIDIME 16 INTERMEDIATE Intermediate     CEFTRIAXONE 16 INTERMEDIATE Intermediate     CIPROFLOXACIN <=0.25 SENSITIVE Sensitive     GENTAMICIN <=1 SENSITIVE Sensitive     IMIPENEM 1 SENSITIVE Sensitive     TRIMETH/SULFA <=20 SENSITIVE Sensitive     PIP/TAZO >=128 RESISTANT Resistant     * FEW ENTEROBACTER AEROGENES    Radiology Reports Dg Chest 2 View  Result Date: 03/07/2016 CLINICAL DATA:  Pneumonia  EXAM: CHEST  2 VIEW COMPARISON:  03/06/2016 FINDINGS: Tracheostomy to unchanged. Normal cardiac silhouette. Low lung volumes and mild basilar atelectasis. Lungs are clear. IMPRESSION: No acute cardiopulmonary findings. Electronically Signed   By: Genevive Bi M.D.   On: 03/07/2016 08:18   Dg Chest Port 1 View  Result Date: 03/06/2016 CLINICAL DATA:  Productive cough, trach. Hx of DM, HTN,stroke. severly SOB. EXAM: PORTABLE CHEST - 1 VIEW COMPARISON:  02/22/2016 CT and previous studies FINDINGS: Tracheostomy stable. Low lung volumes. Patchy interstitial infiltrates or subsegmental atelectasis in the lung bases slightly increased since previous exam. Heart size and mediastinal contours are within normal limits. No effusion. Visualized bones unremarkable. IMPRESSION: Low volumes with some increase in bibasilar atelectasis or infiltrate. Electronically Signed   By: Corlis Leak M.D.   On: 03/06/2016 12:59      Time Spent in minutes  30   Lariza Cothron K M.D on 03/08/2016 at 10:11 AM  Between 7am to 7pm - Pager - [phone]  After 7pm go to [email or web address] - password [email or web address]  [facility] -  Office  [phone]

## 2016-03-08 NOTE — Progress Notes (Addendum)
Pharmacy Antibiotic Note  Yesenia [name] is a 55 y.o. female with recent hospitalization for cellulitis at her trach site returned to the ED  On 03/06/16 with respiratory distress. CXR with possible infiltrates. She was started on  vancomycin and zosyn to cover for HCAP. Today 03/08/16 vancomycin and zosyn are discontinued, narrowing regimen to Cefepime only based on cultures. Pharmacy consulted for cefepime dosing for bronchitis.  Afebrile, WBc 6.4k  CK D stage III. Creatinine at baseline, SCr 1.28 stable, CrCl ~ 60-70 ml/min    Plan: Start Cefepime 2g IV q12h Follow renal function, cultures, LOT.   Height: 5\' 6"  (167.6 cm) Weight: (!) 317 lb 7.4 oz (144 kg) IBW/kg (Calculated) : 59.3  Temp (24hrs), Avg:98.6 F (37 C), Min:98.3 F (36.8 C), Max:99.3 F (37.4 C)   Recent Labs Lab 03/06/16 1300 03/06/16 1323 03/06/16 1638 03/07/16 0516 03/08/16 0538  WBC 7.1  --   --  6.4  --   CREATININE 1.26*  --   --  1.26* 1.28*  LATICACIDVEN  --  3.01* 2.18*  --   --     Estimated Creatinine Clearance: 73.9 mL/min (by C-G formula based on SCr of 1.28 mg/dL).    Allergies  Allergen Reactions  . Buprenorphine Hcl Hives  . Morphine And Related Hives and Dermatitis    Antimicrobials this admission: 8/26 Vancomycin >>8/28 8/26 Zosyn >>8/28 8/28 Cefepime>>  Dose adjustments this admission: n/a  Microbiology results: 8/26 blood x2>>ngtd  8/26 BC x 2: NGTD 8/26 TA: GPR-- Enterobacter aerogenes:  Sens cefepime, cipro, imipenem, gent, trimeth/sulfa.  I- fortaz, rocephin,  R- ancef, zosyn.    Thank you for allowing pharmacy to be a part of this patient's care. Noah Delaineuth Joye Wesenberg, RPh Clinical Pharmacist Pager: [phone] 03/08/2016 11:33 AM

## 2016-03-09 LAB — CULTURE, RESPIRATORY

## 2016-03-09 LAB — CULTURE, RESPIRATORY W GRAM STAIN

## 2016-03-09 LAB — GLUCOSE, CAPILLARY
GLUCOSE-CAPILLARY: 122 mg/dL — AB (ref 65–99)
GLUCOSE-CAPILLARY: 117 mg/dL — AB (ref 65–99)
Glucose-Capillary: 98 mg/dL (ref 65–99)
Glucose-Capillary: 142 mg/dL — ABNORMAL HIGH (ref 65–99)

## 2016-03-09 MED ORDER — TRAZODONE HCL 100 MG PO TABS
300.0000 mg | ORAL_TABLET | Freq: Every day | ORAL | Status: DC
  Administered 2016-03-09 – 2016-03-10 (×2): 300 mg via ORAL
  Filled 2016-03-09 (×2): qty 3

## 2016-03-09 NOTE — Progress Notes (Signed)
Rt Note: RT came to assess patient this afternoon. Patient was asleep upon my arrival and was on room air. Her initial Spo2 was only 92% on room air. Patient had been maintaining all day with no issues on room air but had been up in the chair and awake in bed during my other assessments of her so she had maintained an Spo2 of 98% on room air all day with no issues. This time she was asleep when I came to see her. After she woke up her Spo2 came up to 99%. I explained to her that when she is asleep she needs to wear her supplemental O2. She understands the need to do so and her trach collar was placed back on her at 28% and she has been maintaining and Spo2 of 99%. She said she just fell asleep and would be compliant with wearing it while she is asleep. Patient is in no apparent acute respiratory distress and RT will continue to monitor her.

## 2016-03-09 NOTE — Progress Notes (Signed)
PROGRESS NOTE                                                                                                                                                                                                             Patient Demographics:    Yesenia [name], is a 55 y.o. female, DOB - 1961/02/07, ZOX:[id number]  Admit date - 03/06/2016   Admitting Physician Esperanza Sheets, MD  Outpatient Primary MD for the patient is [name], [name], NP  LOS - 3  Chief Complaint  Patient presents with  . Respiratory Distress  . Chest Pain       Brief Narrative   55 y/o female with PMH of HTN, HPL, CVA, CKD, OSA recent tracheostomy placement, complicated with cellulitis of trach site,  presented with productive cough, shortness of breath. Found to have pneumonia and tracheobronchitis with acute respiratory failure with mild hypoxia.     Subjective:    [name] today has, No headache, No chest pain, No abdominal pain - No Nausea, No new weakness tingling or numbness, ImprovedCough - SOB.     Assessment  & Plan :     1. Acute hypoxic respiratory failure due to early tracheobronchitis Cultures growing Enterobacter. Tracheostomy placed a few weeks ago by Dr. Jenne Pane ENT for sleep apnea, chest x-ray stable, patient is now afebrile Antibiotics adjusted based on cultures on 03/08/2016, have requested Dr. Jenne Pane to evaluate as well, still has lot of secretions from her trach site will have respiratory therapist suctioned as needed. Continue routine trach care.  2. CK D stage III. Creatinine at baseline.  3. Hypokalemia. Replaced and stable.  4. Morbid obesity with obstructive sleep apnea and obesity related hypoventilation syndrome. Status post tracheostomy, follow with PCP for weight loss.  5. GERD. On PPI.  6. DM type II. Continue home regimen and sliding scale.  Lab Results  Component Value Date   HGBA1C 6.1 (H)  02/05/2016   CBG (last 3)   Recent Labs  03/08/16 1635 03/08/16 2036 03/09/16 0809  GLUCAP 88 138* 122*      Family Communication  :  none  Code Status :  Full  Diet : Heart Healthy Low Carb  Disposition Plan  :  Home 1-2 days  Consults  :  ENT  Procedures  :  DVT Prophylaxis  :  Heparin   Lab Results  Component Value Date   PLT 311 03/07/2016    Inpatient Medications  Scheduled Meds: . busPIRone  15 mg Oral BID  . ceFEPime (MAXIPIME) IV  2 g Intravenous Q12H  . chlorhexidine  15 mL Mouth Rinse BID  . clonazePAM  0.5 mg Oral QHS  . escitalopram  5 mg Oral Daily  . heparin  5,000 Units Subcutaneous Q8H  . insulin aspart  0-9 Units Subcutaneous TID WC  . mouth rinse  15 mL Mouth Rinse q12n4p  . pantoprazole  40 mg Oral Daily  . potassium chloride  40 mEq Oral BID  . pregabalin  50 mg Oral Daily  . saccharomyces boulardii  250 mg Oral BID  . traZODone  300 mg Oral QHS   Continuous Infusions:   PRN Meds:.acetaminophen **OR** acetaminophen, albuterol, bacitracin, bisacodyl, gabapentin, magnesium citrate, ondansetron **OR** ondansetron (ZOFRAN) IV, senna-docusate  Antibiotics  :    Anti-infectives    Start     Dose/Rate Route Frequency Ordered Stop   03/08/16 1230  ceFEPIme (MAXIPIME) 2 g in dextrose 5 % 50 mL IVPB     2 g 100 mL/hr over 30 Minutes Intravenous Every 12 hours 03/08/16 1139     03/07/16 0500  vancomycin (VANCOCIN) 1,250 mg in sodium chloride 0.9 % 250 mL IVPB  Status:  Discontinued     1,250 mg 166.7 mL/hr over 90 Minutes Intravenous Every 12 hours 03/06/16 1617 03/08/16 1008   03/06/16 2200  piperacillin-tazobactam (ZOSYN) IVPB 3.375 g  Status:  Discontinued     3.375 g 12.5 mL/hr over 240 Minutes Intravenous Every 8 hours 03/06/16 1617 03/08/16 1008   03/06/16 1700  vancomycin (VANCOCIN) IVPB 1000 mg/200 mL premix     1,000 mg 200 mL/hr over 60 Minutes Intravenous  Once 03/06/16 1617 03/06/16 1947   03/06/16 1315   piperacillin-tazobactam (ZOSYN) IVPB 3.375 g     3.375 g 12.5 mL/hr over 240 Minutes Intravenous  Once 03/06/16 1309 03/06/16 1450   03/06/16 1315  vancomycin (VANCOCIN) IVPB 1000 mg/200 mL premix     1,000 mg 200 mL/hr over 60 Minutes Intravenous  Once 03/06/16 1309 03/06/16 1520         Objective:   Vitals:   03/09/16 0357 03/09/16 0534 03/09/16 0900 03/09/16 0932  BP:  110/76 (!) 126/102   Pulse: 78 80 73 98  Resp: 17 18 20 18   Temp:  98.2 F (36.8 C) 98.7 F (37.1 C)   TempSrc:  Oral Oral   SpO2: 98% 97% 95% 98%  Weight:      Height:        Wt Readings from Last 3 Encounters:  03/08/16 (!) 144.5 kg (318 lb 9 oz)  03/01/16 (!) 142.4 kg (314 lb)  02/23/16 (!) 144.8 kg (319 lb 3.6 oz)     Intake/Output Summary (Last 24 hours) at 03/09/16 1033 Last data filed at 03/09/16 0910  Gross per 24 hour  Intake             1320 ml  Output                2 ml  Net             1318 ml     Physical Exam  Awake Alert, Oriented X 3, No new F.N deficits, Normal affect Center.AT,PERRAL Supple Neck,No JVD, No cervical lymphadenopathy appriciated. Trach site appears stable but + secretions Symmetrical Chest wall  movement, Good air movement bilaterally, CTAB RRR,No Gallops,Rubs or new Murmurs, No Parasternal Heave +ve B.Sounds, Abd Soft, No tenderness, No organomegaly appriciated, No rebound - guarding or rigidity. No Cyanosis, Clubbing or edema, No new Rash or bruise       Data Review:    CBC  Recent Labs Lab 03/06/16 1300 03/07/16 0516  WBC 7.1 6.4  HGB 11.8* 10.3*  HCT 36.8 32.9*  PLT 396 311  MCV 88.0 88.4  MCH 28.2 27.7  MCHC 32.1 31.3  RDW 14.1 14.3    Chemistries   Recent Labs Lab 03/06/16 1300 03/07/16 0516 03/08/16 0538  NA 140 141 143  K 3.1* 3.4* 3.8  CL 105 108 110  CO2 26 27 26   GLUCOSE 110* 127* 122*  BUN 16 13 13   CREATININE 1.26* 1.26* 1.28*  CALCIUM 9.5 8.8* 9.0  MG  --   --  1.9  AST  --  16  --   ALT  --  16  --   ALKPHOS  --  60   --   BILITOT  --  0.6  --    ------------------------------------------------------------------------------------------------------------------ No results for input(s): CHOL, HDL, LDLCALC, TRIG, CHOLHDL, LDLDIRECT in the last 72 hours.  Lab Results  Component Value Date   HGBA1C 6.1 (H) 02/05/2016   ------------------------------------------------------------------------------------------------------------------ No results for input(s): TSH, T4TOTAL, T3FREE, THYROIDAB in the last 72 hours.  Invalid input(s): FREET3 ------------------------------------------------------------------------------------------------------------------ No results for input(s): VITAMINB12, FOLATE, FERRITIN, TIBC, IRON, RETICCTPCT in the last 72 hours.  Coagulation profile  Recent Labs Lab 03/07/16 0516  INR 1.26     Recent Labs  03/06/16 1300  DDIMER 0.36    Cardiac Enzymes No results for input(s): CKMB, TROPONINI, MYOGLOBIN in the last 168 hours.  Invalid input(s): CK ------------------------------------------------------------------------------------------------------------------ No results found for: BNP  Micro Results Recent Results (from the past 240 hour(s))  Blood culture (routine x 2)     Status: None (Preliminary result)   Collection Time: 03/06/16  1:51 PM  Result Value Ref Range Status   Specimen Description BLOOD RIGHT ANTECUBITAL  Final   Special Requests BOTTLES DRAWN AEROBIC AND ANAEROBIC 5CC  Final   Culture NO GROWTH < 24 HOURS  Final   Report Status PENDING  Incomplete  Blood culture (routine x 2)     Status: None (Preliminary result)   Collection Time: 03/06/16  2:17 PM  Result Value Ref Range Status   Specimen Description BLOOD LEFT ANTECUBITAL  Final   Special Requests BOTTLES DRAWN AEROBIC AND ANAEROBIC 5CC  Final   Culture NO GROWTH < 24 HOURS  Final   Report Status PENDING  Incomplete  Culture, respiratory (NON-Expectorated)     Status: None (Preliminary result)     Collection Time: 03/06/16 10:56 PM  Result Value Ref Range Status   Specimen Description TRACHEAL ASPIRATE  Final   Special Requests NONE  Final   Gram Stain   Final    FEW WBC PRESENT, PREDOMINANTLY PMN RARE GRAM POSITIVE RODS    Culture FEW ENTEROBACTER AEROGENES  Final   Report Status PENDING  Incomplete   Organism ID, Bacteria ENTEROBACTER AEROGENES  Final      Susceptibility   Enterobacter aerogenes - MIC*    CEFAZOLIN >=64 RESISTANT Resistant     CEFEPIME <=1 SENSITIVE Sensitive     CEFTAZIDIME 16 INTERMEDIATE Intermediate     CEFTRIAXONE 16 INTERMEDIATE Intermediate     CIPROFLOXACIN <=0.25 SENSITIVE Sensitive     GENTAMICIN <=1 SENSITIVE Sensitive  IMIPENEM 1 SENSITIVE Sensitive     TRIMETH/SULFA <=20 SENSITIVE Sensitive     PIP/TAZO >=128 RESISTANT Resistant     * FEW ENTEROBACTER AEROGENES  Urine culture     Status: None (Preliminary result)   Collection Time: 03/06/16 11:34 PM  Result Value Ref Range Status   Specimen Description URINE, RANDOM  Final   Special Requests NONE  Final   Culture CULTURE REINCUBATED FOR BETTER GROWTH  Final   Report Status PENDING  Incomplete    Radiology Reports Dg Chest 2 View  Result Date: 03/07/2016 CLINICAL DATA:  Pneumonia EXAM: CHEST  2 VIEW COMPARISON:  03/06/2016 FINDINGS: Tracheostomy to unchanged. Normal cardiac silhouette. Low lung volumes and mild basilar atelectasis. Lungs are clear. IMPRESSION: No acute cardiopulmonary findings. Electronically Signed   By: Genevive BiStewart  Edmunds M.D.   On: 03/07/2016 08:18   Dg Chest Port 1 View  Result Date: 03/06/2016 CLINICAL DATA:  Productive cough, trach. Hx of DM, HTN,stroke. severly SOB. EXAM: PORTABLE CHEST - 1 VIEW COMPARISON:  02/22/2016 CT and previous studies FINDINGS: Tracheostomy stable. Low lung volumes. Patchy interstitial infiltrates or subsegmental atelectasis in the lung bases slightly increased since previous exam. Heart size and mediastinal contours are within normal  limits. No effusion. Visualized bones unremarkable. IMPRESSION: Low volumes with some increase in bibasilar atelectasis or infiltrate. Electronically Signed   By: Corlis Leak  Hassell M.D.   On: 03/06/2016 12:59      Time Spent in minutes  30   Shanasia Ibrahim K M.D on 03/09/2016 at 10:33 AM  Between 7am to 7pm - Pager - [phone]  After 7pm go to [email or web address] - password [email or web address]  [facility] -  Office  [phone]

## 2016-03-09 NOTE — Evaluation (Signed)
Occupational Therapy Evaluation Patient Details Name: Yesenia [name] MRN: [id number] DOB: [birth year] Today's Date: 03/09/2016    History of Present Illness 55 y.o. female with medical history significant for obstructive  sleep apnea, status post placement of a tracheostomy site on Aug 2017, complicated by cellulitis in the trach site  requiring hospitalization, CK D, chronic pain-fibromyalgia, hyperlipidemia, hypertension, insomnia, history of stroke with right-sided weakness,morbid obesity, diabetes with polyneuropathy,GERD, discharge from the nursing home on was 15, presenting with increased work of breathing and a productive cough with yellow sputum   Clinical Impression   Pt is at min guard level with LB ADLs, min guard/sup with ADL mobility and transfers. [facility] at [facility] for rehab P[facility]. Pt plans to return to SNF for rehab before going home. Pt has DME for bathroom at home. All education completed and no further acute OT indicated. Defer further OT intervention to SNF    Follow Up Recommendations  SNF    Equipment Recommendations  Other (comment) (TBD at next venue of care)    Recommendations for Other Services       Precautions / Restrictions Precautions Precautions: None Precaution Comments: oxygen dependent Restrictions Weight Bearing Restrictions: No      Mobility Bed Mobility Overal bed mobility: Independent                Transfers Overall transfer level: Needs assistance Equipment used: None Transfers: Sit to/from Stand Sit to Stand: Supervision Stand pivot transfers: Supervision            Balance     Sitting balance-Leahy Scale: Good       Standing balance-Leahy Scale: Fair                              ADL Overall ADL's : Needs assistance/impaired     Grooming: Wash/dry hands;Wash/dry face;Supervision/safety;Standing   Upper Body Bathing: Set up   Lower Body Bathing: Min guard   Upper Body Dressing : Set up   Lower  Body Dressing: Min guard   Toilet Transfer: Comfort height toilet;Ambulation;Grab bars;Supervision/safety   Toileting- Clothing Manipulation and Hygiene: Min guard;Sit to/from stand   Tub/ Engineer, structural: Grab bars;Ambulation;3 in 1;Min guard;Supervision/safety   Functional mobility during ADLs: Supervision/safety;Min guard General ADL Comments: educated pt on use of ADL A/E     Vision  no change from baseline              Pertinent Vitals/Pain Pain Assessment: No/denies pain     Hand Dominance Right   Extremity/Trunk Assessment Upper Extremity Assessment Upper Extremity Assessment: Overall WFL for tasks assessed           Communication Communication Communication: Tracheostomy;No difficulties   Cognition Arousal/Alertness: Awake/alert Behavior During Therapy: WFL for tasks assessed/performed Overall Cognitive Status: Within Functional Limits for tasks assessed                     General Comments   pt very pleasant and cooperative                 Home Living Family/patient expects to be discharged to:: Skilled nursing facility Living Arrangements: Alone Available Help at Discharge: Family;Available PRN/intermittently Type of Home: Apartment Home Access: Stairs to enter Entrance Stairs-Number of Steps: 12 Entrance Stairs-Rails: Right;Left Home Layout: One level     Bathroom Shower/Tub: Chief Strategy Officer: Standard Bathroom Accessibility: Yes   Home Equipment: Environmental consultant - 2 wheels;Tub bench;Toilet  riser   Additional Comments: admitted from SNF, did not begin PT yet      Prior Functioning/Environment Level of Independence: Independent        Comments: independent with household chores except laundry (on separate floor)    OT Diagnosis: Other (comment) (low endurance)   OT Problem List: Decreased activity tolerance;Impaired balance (sitting and/or standing);Obesity;Decreased knowledge of use of DME or AE   OT  Treatment/Interventions:      OT Goals(Current goals can be found in the care plan section) Acute Rehab OT Goals Patient Stated Goal: regain stamina  OT Frequency:     Barriers to D/C:  none                        End of Session Equipment Utilized During Treatment: Gait belt  Activity Tolerance: Patient limited by fatigue Patient left: in bed   Time: [id number] OT Time Calculation (min): 24 min Charges:  OT General Charges $OT Visit: 1 Procedure OT Evaluation $OT Eval Moderate Complexity: 1 Procedure OT Treatments $Therapeutic Activity: 8-22 mins G-Codes:    Galen ManilaSpencer, Stryder Poitra Jeanette 03/09/2016, 11:24 AM

## 2016-03-09 NOTE — Progress Notes (Signed)
Patient ID: [name], female   DOB: 12-20-1960, 55 y.o.   MRN: [id number] Patient admitted and treated for tracheobronchitis. Trach wound looks great with much improvement in previous gaping nature of wound.  Now, wound snug around #4 cuffless Shiley tube that is in place and in good position.  No evidence of cellulitis around trach site. A/P: s/p tracheostomy for sleep apnea Trach site looks great.  She needs to continue to learn trach care management.  She can follow-up with me after discharge from nursing facility.

## 2016-03-10 DIAGNOSIS — K219 Gastro-esophageal reflux disease without esophagitis: Secondary | ICD-10-CM

## 2016-03-10 DIAGNOSIS — N183 Chronic kidney disease, stage 3 (moderate): Secondary | ICD-10-CM

## 2016-03-10 DIAGNOSIS — N179 Acute kidney failure, unspecified: Secondary | ICD-10-CM

## 2016-03-10 DIAGNOSIS — J189 Pneumonia, unspecified organism: Principal | ICD-10-CM

## 2016-03-10 DIAGNOSIS — N189 Chronic kidney disease, unspecified: Secondary | ICD-10-CM

## 2016-03-10 LAB — GLUCOSE, CAPILLARY
GLUCOSE-CAPILLARY: 112 mg/dL — AB (ref 65–99)
GLUCOSE-CAPILLARY: 135 mg/dL — AB (ref 65–99)
GLUCOSE-CAPILLARY: 105 mg/dL — AB (ref 65–99)
Glucose-Capillary: 122 mg/dL — ABNORMAL HIGH (ref 65–99)

## 2016-03-10 LAB — URINE CULTURE

## 2016-03-10 MED ORDER — GLIPIZIDE ER 2.5 MG PO TB24
2.5000 mg | ORAL_TABLET | Freq: Every day | ORAL | Status: DC
  Administered 2016-03-11: 2.5 mg via ORAL
  Filled 2016-03-10 (×2): qty 1

## 2016-03-10 MED ORDER — LEVOFLOXACIN 500 MG PO TABS
500.0000 mg | ORAL_TABLET | Freq: Every day | ORAL | Status: DC
  Administered 2016-03-11: 500 mg via ORAL
  Filled 2016-03-10: qty 1

## 2016-03-10 MED ORDER — LEVOFLOXACIN 750 MG PO TABS
750.0000 mg | ORAL_TABLET | Freq: Once | ORAL | Status: AC
  Administered 2016-03-10: 750 mg via ORAL
  Filled 2016-03-10: qty 1

## 2016-03-10 MED ORDER — DM-GUAIFENESIN ER 30-600 MG PO TB12
1.0000 | ORAL_TABLET | Freq: Two times a day (BID) | ORAL | Status: DC
  Administered 2016-03-10 – 2016-03-11 (×2): 1 via ORAL
  Filled 2016-03-10 (×2): qty 1

## 2016-03-10 NOTE — [place] FL2 (Signed)
[place] MEDICAID FL2 LEVEL OF CARE SCREENING TOOL     IDENTIFICATION  Patient Name: Yesenia [name] Birthdate: 11/26/1960 Sex: female Admission Date (Current Location): 03/06/2016  [place]  [place] [id number] Facility and Address:  [facility] [place], [address], [place], [place] [address]      Provider Number: [id number]  Attending Physician Name and Address:  Rhetta MuraJai-Gurmukh Amaira Safley, MD  Relative Name and Phone Number:  Howell,Catrina Daughter - (h) [phone]     Current Level of Care: SNF Recommended Level of Care: Skilled Nursing Facility ([facility]) Prior Approval Number:    Date Approved/Denied:   PASRR Number: [id number] [id number] (Eff. 01/25/13)  Discharge Plan: SNF    Current Diagnoses: Patient Active Problem List   Diagnosis Date Noted  . HCAP (healthcare-associated pneumonia) 03/06/2016  . Fibromyalgia 02/22/2016  . Cellulitis 02/21/2016  . Abdominal pain 02/16/2016  . Constipation 02/16/2016  . CKD (chronic kidney disease) stage 3, GFR 30-59 ml/min 02/16/2016  . Fever   . Cough   . Sleep apnea 02/13/2016  . Insomnia 08/10/2015  . Polyneuropathy (HCC) 07/10/2015  . GERD (gastroesophageal reflux disease) 07/10/2015  . Acute kidney injury superimposed on chronic kidney disease (HCC) 07/07/2015  . Gait disturbance 07/07/2015  . Morbid obesity (HCC) 07/07/2015  . Left hip pain 07/06/2015  . Chronic pain   . Right sided weakness   . Headache(784.0) 04/14/2013  . Encephalopathy acute 04/11/2013  . Acute pyelonephritis 04/11/2013  . Diabetes mellitus, type 2 (HCC) 04/11/2013  . Dyslipidemia 04/11/2013  . Chest pain 02/15/2013  . CVA (cerebral infarction) 01/14/2013  . HTN (hypertension) 01/14/2013  . Anemia 01/14/2013    Orientation RESPIRATION BLADDER Height & Weight     Self, Time, Situation, Place  Tracheostomy (Shiley 4 mm uncuffed trach) Continent Weight: (!) 320 lb 8.8 oz (145.4  kg) Height:  5\' 6"  (167.6 cm)  BEHAVIORAL SYMPTOMS/MOOD NEUROLOGICAL BOWEL NUTRITION STATUS    Convulsions/Seizures (Hx of stroke) Continent Diet (Heart healthy - carb modified)  AMBULATORY STATUS COMMUNICATION OF NEEDS Skin   Limited Assist Verbally Normal                       Personal Care Assistance Level of Assistance  Bathing, Feeding, Dressing Bathing Assistance: Limited assistance Feeding assistance: Independent Dressing Assistance: Limited assistance     Functional Limitations Info  Sight, Hearing, Speech Sight Info: Adequate Hearing Info: Adequate Speech Info: Adequate    SPECIAL CARE FACTORS FREQUENCY  PT (By licensed PT), OT (By licensed OT)     PT Frequency: Evaluated 8/27 and a minimum of 3X per week therapy recommended OT Frequency: Evaluated 8/29            Contractures Contractures Info: Not present    Additional Factors Info  Code Status, Allergies Code Status Info: Full Allergies Info: Buprenorphine Hcl, Morphine and related           Current Medications (03/10/2016):  This is the current hospital active medication list Current Facility-Administered Medications  Medication Dose Route Frequency Provider Last Rate Last Dose  . acetaminophen (TYLENOL) tablet 650 mg  650 mg Oral Q6H PRN Marcos EkeSara E Wertman, PA-C   650 mg at 03/10/16 1100   Or  . acetaminophen (TYLENOL) suppository 650 mg  650 mg Rectal Q6H PRN Marcos EkeSara E Wertman, PA-C      . albuterol (PROVENTIL) (2.5 MG/3ML) 0.083% nebulizer solution 2.5 mg  2.5 mg Nebulization Q6H PRN Isaiah SergeUlugbek N  York Spaniel, MD      . bacitracin ointment   Topical TID PRN Roma Kayser Schorr, NP      . bisacodyl (DULCOLAX) suppository 10 mg  10 mg Rectal Daily PRN Marcos Eke, PA-C      . busPIRone (BUSPAR) tablet 15 mg  15 mg Oral BID Marcos Eke, PA-C   15 mg at 03/10/16 1055  . chlorhexidine (PERIDEX) 0.12 % solution 15 mL  15 mL Mouth Rinse BID Leroy Sea, MD   15 mL at 03/10/16 1056  . clonazePAM (KLONOPIN)  tablet 0.5 mg  0.5 mg Oral QHS Marcos Eke, PA-C   0.5 mg at 03/09/16 2126  . escitalopram (LEXAPRO) tablet 5 mg  5 mg Oral Daily Marcos Eke, PA-C   5 mg at 03/10/16 1055  . gabapentin (NEURONTIN) capsule 300 mg  300 mg Oral Daily PRN Marcos Eke, PA-C      . Melene Muller ON 03/11/2016] glipiZIDE (GLUCOTROL XL) 24 hr tablet 2.5 mg  2.5 mg Oral Q breakfast Rhetta Mura, MD      . heparin injection 5,000 Units  5,000 Units Subcutaneous Q8H Marcos Eke, PA-C   5,000 Units at 03/09/16 2128  . [START ON 03/11/2016] levofloxacin (LEVAQUIN) tablet 500 mg  500 mg Oral Daily Rhetta Mura, MD      . magnesium citrate solution 1 Bottle  1 Bottle Oral Once PRN Marcos Eke, PA-C      . MEDLINE mouth rinse  15 mL Mouth Rinse q12n4p Leroy Sea, MD   15 mL at 03/10/16 1200  . ondansetron (ZOFRAN) tablet 4 mg  4 mg Oral Q6H PRN Marcos Eke, PA-C   4 mg at 03/09/16 1055   Or  . ondansetron (ZOFRAN) injection 4 mg  4 mg Intravenous Q6H PRN Marcos Eke, PA-C      . pantoprazole (PROTONIX) EC tablet 40 mg  40 mg Oral Daily Marcos Eke, PA-C   40 mg at 03/10/16 1056  . pregabalin (LYRICA) capsule 50 mg  50 mg Oral Daily Marcos Eke, PA-C   50 mg at 03/10/16 1000  . saccharomyces boulardii (FLORASTOR) capsule 250 mg  250 mg Oral BID Marcos Eke, PA-C   250 mg at 03/10/16 1055  . senna-docusate (Senokot-S) tablet 1 tablet  1 tablet Oral QHS PRN Marcos Eke, PA-C      . traZODone (DESYREL) tablet 300 mg  300 mg Oral QHS Leroy Sea, MD   300 mg at 03/09/16 2127     Discharge Medications: Please see discharge summary for a list of discharge medications.  Relevant Imaging Results:  Relevant Lab Results:   Additional Information ss#[id number]  Cristobal Goldmann, LCSW

## 2016-03-10 NOTE — Progress Notes (Signed)
Pt is on ATC at this time tolerating it well. sats 100%. No distress noted.

## 2016-03-10 NOTE — Progress Notes (Signed)
No ambu bag in room on assessment, one provided per RRT. Trach care done per RRT, IC was cleansed with NS and sterile water and dried with gauze. IC was re-inserted back into trach flange and locked back into place, no complications or distress noted. Pt is on ROOM AIR. O2 sats are stable at 97%. Site dressing was soiled, new dressing applied. Pt is stable at this time no resp distress noted. RN aware of trach care.

## 2016-03-10 NOTE — Clinical Social Work Note (Signed)
Clinical Social Work Assessment  Patient Details  Name: Yesenia [name] MRN: [id number] Date of Birth: 06-28-1961  Date of referral:  03/10/16               Reason for consult:  Facility Placement                Permission sought to share information with:  Family Supports Permission granted to share information::  Yes, Verbal Permission Granted  Name::     Yesenia [name]  Agency::     Relationship::  Daughter  Contact Information:  [phone]  Housing/Transportation Living arrangements for the past 2 months:  Skilled Nursing Facility ([facility] 02/[facility]/16 and prior to this patient was at home.) Source of Information:  Patient Patient Interpreter Needed:  None Criminal Activity/Legal Involvement Pertinent to Current Situation/Hospitalization:  No - Comment as needed Significant Relationships:  Adult Children, Friend, Other Family Members Lives with:  Facility Resident ([facility]) Do you feel safe going back to the place where you live?  Yes Need for family participation in patient care:  No (Coment)  Care giving concerns: Patient expressed no concerns regarding care at skilled facility.   Social Worker assessment / plan:  CSW talked with patient at the bedside regarding today's discharge and discharge disposition. Yesenia [name] is from [facility] skilled facility and plans to return when medically stable. Yesenia [name] reported that she will return home after rehab and she lives alone. Her daughter, Yesenia [name] not live in [place] per [place].  Employment status:  Disabled (Comment on whether or not currently receiving Disability) Insurance information:  [place]  Skilled Nursing Facility Information / Referral to community resources:  Other (Comment Required) (None needed as patient from a skilled facility)  Patient/Family's Response to care: No concerns reported regarding care during  hospitalization.  Patient/Family's Understanding of and Emotional Response to Diagnosis, Current Treatment, and Prognosis:  Patient has a trach and not sure of patient's ability to care for trach at discharge from skilled facility.  Emotional Assessment Appearance:  Appears stated age Attitude/Demeanor/Rapport:  Other (Appropriate) Affect (typically observed):  Appropriate, Pleasant Orientation:  Oriented to Self, Oriented to Place, Oriented to  Time, Oriented to Situation Alcohol / Substance use:  Tobacco Use, Alcohol Use, Illicit Drugs (Patient reports that she does not smoke, drink or use illicit drugs) Psych involvement (Current and /or in the community):  No (Comment)  Discharge Needs  Concerns to be addressed:  Discharge Planning Concerns Readmission within the last 30 days:  Yes Current discharge risk:  None Barriers to Discharge:  No Barriers Identified   Yesenia [name], Yesenia Arechiga Bradley, LCSW 03/10/2016, 2:05 PM

## 2016-03-10 NOTE — Progress Notes (Signed)
PROGRESS NOTE                                                                                                                                                                                                            Brief Narrative   55 y/o ?  PMH of  HTN, hyperlipidemia CVA CK D Severe obstructive sleep apnea who underwent tracheotomy placement sometime in early August This was complicated with cellulitis of trach site,  presented with productive cough, shortness of breath.  The patient was then found to have pneumonia and tracheobronchitis and admitted   Subjective:   Seems to be doing well Still has some cough Subjective productive sputum No chest pain No blurred vision No double vision About eat    Assessment  & Plan :     1. Acute hypoxic respiratory failure due to early tracheobronchitis Cultures growing Enterobacter.  Tracheostomy placed a few weeks ago by Dr. Jenne PaneBates ENT for sleep apnea,  chest x-ray stable,  patient is now afebrile  Antibiotics adjusted To Levaquin by mouth Appreciate evaluation by Dr. Jenne PaneBates 03/09/16  2. CK D stage III. Creatinine at baseline.  3. Hypokalemia. Replaced and stable. Hypertension HCTZ 25 daily held on admission, restart 12.5 daily Might account for hypokalemia  4. SuperMorbid obesity with obstructive sleep apnea and obesity related hypoventilation syndrome.  Body mass index is 51.74 kg/m. Status post tracheostomy, follow with PCP for weight loss.  5. GERD. On PPI.  6. 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 Depression Continue BuSpar 15 twice a day, Lexapro 5 daily, trazodone 300 daily at bedtime  Lab Results  Component Value Date   HGBA1C 6.1 (H) 02/05/2016   CBG (last 3)   Recent Labs  03/09/16 2030 03/10/16 0756 03/10/16 1147  GLUCAP 142* 112* 135*      Family Communication  :  none  Code Status :  Full  Diet : Heart Healthy Low Carb  Disposition Plan  :  Home 24 hrs if all well  Consults  :  ENT  Procedures  :     DVT Prophylaxis  :  Heparin   Lab  Results  Component Value Date   PLT 311 03/07/2016    Inpatient Medications  Scheduled Meds: . busPIRone  15 mg Oral BID  . chlorhexidine  15 mL Mouth Rinse BID  . clonazePAM  0.5 mg Oral QHS  . escitalopram  5 mg Oral Daily  . heparin  5,000 Units Subcutaneous Q8H  . insulin aspart  0-9 Units Subcutaneous TID WC  . [START ON 03/11/2016] levofloxacin  500 mg Oral Daily  . mouth rinse  15 mL Mouth Rinse q12n4p  . pantoprazole  40 mg Oral Daily  . pregabalin  50 mg Oral Daily  . saccharomyces boulardii  250 mg Oral BID  . traZODone  300 mg Oral QHS   Continuous Infusions:   PRN Meds:.acetaminophen **OR** acetaminophen, albuterol, bacitracin, bisacodyl, gabapentin, magnesium citrate, ondansetron **OR** ondansetron (ZOFRAN) IV, senna-docusate  Antibiotics  :    Anti-infectives    Start     Dose/Rate Route Frequency Ordered Stop   03/11/16 1000  levofloxacin (LEVAQUIN) tablet 500 mg     500 mg Oral Daily 03/10/16 1016     03/10/16 1115  levofloxacin (LEVAQUIN) tablet 750 mg     750 mg Oral  Once 03/10/16 1016 03/10/16 1056   03/08/16 1230  ceFEPIme (MAXIPIME) 2 g in dextrose 5 % 50 mL IVPB  Status:  Discontinued     2 g 100 mL/hr over 30 Minutes Intravenous Every 12 hours 03/08/16 1139 03/10/16 1016   03/07/16 0500  vancomycin (VANCOCIN) 1,250 mg in sodium chloride 0.9 % 250 mL IVPB  Status:  Discontinued     1,250 mg 166.7 mL/hr over 90 Minutes Intravenous Every 12 hours 03/06/16 1617 03/08/16 1008   03/06/16 2200  piperacillin-tazobactam (ZOSYN) IVPB 3.375 g  Status:  Discontinued     3.375 g 12.5 mL/hr over 240 Minutes Intravenous Every 8 hours 03/06/16 1617 03/08/16 1008   03/06/16 1700   vancomycin (VANCOCIN) IVPB 1000 mg/200 mL premix     1,000 mg 200 mL/hr over 60 Minutes Intravenous  Once 03/06/16 1617 03/06/16 1947   03/06/16 1315  piperacillin-tazobactam (ZOSYN) IVPB 3.375 g     3.375 g 12.5 mL/hr over 240 Minutes Intravenous  Once 03/06/16 1309 03/06/16 1450   03/06/16 1315  vancomycin (VANCOCIN) IVPB 1000 mg/200 mL premix     1,000 mg 200 mL/hr over 60 Minutes Intravenous  Once 03/06/16 1309 03/06/16 1520         Objective:   Vitals:   03/10/16 0800 03/10/16 0938 03/10/16 1038 03/10/16 1142  BP:   137/78   Pulse:  98 79 70  Resp: 17 16 18 15   Temp:   98.8 F (37.1 C)   TempSrc:   Oral   SpO2: 97% 98% 96% 98%  Weight:      Height:        Wt Readings from Last 3 Encounters:  03/09/16 (!) 145.4 kg (320 lb 8.8 oz)  03/01/16 (!) 142.4 kg (314 lb)  02/23/16 (!) 144.8 kg (319 lb 3.6 oz)     Intake/Output Summary (Last 24 hours) at 03/10/16 1337 Last data filed at 03/10/16 1200  Gross per 24 hour  Intake              770 ml  Output                0 ml  Net              770 ml     Physical Exam  Awake Alert, Oriented X 3, No new F.N deficits, Normal affectAnd she is able to verbalize with [place].AT,PERRAL Supple Neck,No JVD cannot appreciate significant secretions Symmetrical Chest wall movement, Good air movement bilaterally, CTAB RRR, s1 s2 no m/r/g +ve B.Sounds, Abd Soft, No tenderness, No organomegaly appriciated, No rebound - guarding or rigidity. No Cyanosis, Clubbing or edema,  No Rash or bruise       Data Review:    CBC  Recent Labs Lab 03/06/16 1300 03/07/16 0516  WBC 7.1 6.4  HGB 11.8* 10.3*  HCT 36.8 32.9*  PLT 396 311  MCV 88.0 88.4  MCH 28.2 27.7  MCHC 32.1 31.3  RDW 14.1 14.3    Chemistries   Recent Labs Lab 03/06/16 1300 03/07/16 0516 03/08/16 0538  NA 140 141 143  K 3.1* 3.4* 3.8  CL 105 108 110  CO2 26 27 26   GLUCOSE 110* 127* 122*  BUN 16 13 13   CREATININE 1.26* 1.26* 1.28*  CALCIUM 9.5  8.8* 9.0  MG  --   --  1.9  AST  --  16  --   ALT  --  16  --   ALKPHOS  --  60  --   BILITOT  --  0.6  --    ------------------------------------------------------------------------------------------------------------------ No results for input(s): CHOL, HDL, LDLCALC, TRIG, CHOLHDL, LDLDIRECT in the last 72 hours.  Lab Results  Component Value Date   HGBA1C 6.1 (H) 02/05/2016   ------------------------------------------------------------------------------------------------------------------ No results for input(s): TSH, T4TOTAL, T3FREE, THYROIDAB in the last 72 hours.  Invalid input(s): FREET3 ------------------------------------------------------------------------------------------------------------------ No results for input(s): VITAMINB12, FOLATE, FERRITIN, TIBC, IRON, RETICCTPCT in the last 72 hours.  Coagulation profile  Recent Labs Lab 03/07/16 0516  INR 1.26    No results for input(s): DDIMER in the last 72 hours.  Cardiac Enzymes No results for input(s): CKMB, TROPONINI, MYOGLOBIN in the last 168 hours.  Invalid input(s): CK ------------------------------------------------------------------------------------------------------------------ No results found for: BNP  Micro Results Recent Results (from the past 240 hour(s))  Blood culture (routine x 2)     Status: None (Preliminary result)   Collection Time: 03/06/16  1:51 PM  Result Value Ref Range Status   Specimen Description BLOOD RIGHT ANTECUBITAL  Final   Special Requests BOTTLES DRAWN AEROBIC AND ANAEROBIC 5CC  Final   Culture NO GROWTH 3 DAYS  Final   Report Status PENDING  Incomplete  Blood culture (routine x 2)     Status: None (Preliminary result)   Collection Time: 03/06/16  2:17 PM  Result Value Ref Range Status   Specimen Description BLOOD LEFT ANTECUBITAL  Final   Special Requests BOTTLES DRAWN AEROBIC AND ANAEROBIC 5CC  Final   Culture NO GROWTH 3 DAYS  Final   Report Status PENDING   Incomplete  Culture, respiratory (NON-Expectorated)     Status: None   Collection Time: 03/06/16 10:56 PM  Result Value Ref Range Status   Specimen Description TRACHEAL ASPIRATE  Final   Special Requests NONE  Final   Gram Stain   Final    FEW WBC PRESENT, PREDOMINANTLY PMN RARE GRAM POSITIVE RODS    Culture FEW ENTEROBACTER AEROGENES  Final   Report Status 03/09/2016 FINAL  Final   Organism ID, Bacteria ENTEROBACTER AEROGENES  Final      Susceptibility   Enterobacter aerogenes - MIC*    CEFAZOLIN >=64 RESISTANT Resistant     CEFEPIME <=1 SENSITIVE Sensitive     CEFTAZIDIME 16 INTERMEDIATE Intermediate     CEFTRIAXONE 16  INTERMEDIATE Intermediate     CIPROFLOXACIN <=0.25 SENSITIVE Sensitive     GENTAMICIN <=1 SENSITIVE Sensitive     IMIPENEM 1 SENSITIVE Sensitive     TRIMETH/SULFA <=20 SENSITIVE Sensitive     PIP/TAZO >=128 RESISTANT Resistant     * FEW ENTEROBACTER AEROGENES  Urine culture     Status: Abnormal   Collection Time: 03/06/16 11:34 PM  Result Value Ref Range Status   Specimen Description URINE, RANDOM  Final   Special Requests NONE  Final   Culture 40,000 COLONIES/mL ENTEROCOCCUS SPECIES (A)  Final   Report Status 03/10/2016 FINAL  Final   Organism ID, Bacteria ENTEROCOCCUS SPECIES (A)  Final      Susceptibility   Enterococcus species - MIC*    AMPICILLIN 16 RESISTANT Resistant     LEVOFLOXACIN >=8 RESISTANT Resistant     NITROFURANTOIN 32 SENSITIVE Sensitive     VANCOMYCIN <=0.5 SENSITIVE Sensitive     * 40,000 COLONIES/mL ENTEROCOCCUS SPECIES    Radiology Reports Dg Chest 2 View  Result Date: 03/07/2016 CLINICAL DATA:  Pneumonia EXAM: CHEST  2 VIEW COMPARISON:  03/06/2016 FINDINGS: Tracheostomy to unchanged. Normal cardiac silhouette. Low lung volumes and mild basilar atelectasis. Lungs are clear. IMPRESSION: No acute cardiopulmonary findings. Electronically Signed   By: Genevive Bi M.D.   On: 03/07/2016 08:18   Dg Chest Port 1 View  Result Date:  03/06/2016 CLINICAL DATA:  Productive cough, trach. Hx of DM, HTN,stroke. severly SOB. EXAM: PORTABLE CHEST - 1 VIEW COMPARISON:  02/22/2016 CT and previous studies FINDINGS: Tracheostomy stable. Low lung volumes. Patchy interstitial infiltrates or subsegmental atelectasis in the lung bases slightly increased since previous exam. Heart size and mediastinal contours are within normal limits. No effusion. Visualized bones unremarkable. IMPRESSION: Low volumes with some increase in bibasilar atelectasis or infiltrate. Electronically Signed   By: Corlis Leak M.D.   On: 03/06/2016 12:59      Time Spent in minutes  20  Pleas Koch, MD [facility] [phone]

## 2016-03-10 NOTE — Progress Notes (Signed)
Pharmacy Antibiotic Note  Yesenia [name] is a 55 y.o. female with recent hospitalization for cellulitis at her trach site returned to the ED on 03/06/16 with respiratory distress. CXR with possible infiltrates.  Tracheostomy placed 02/13/16 pta She was started on  vancomycin and zosyn to cover for  possible HCAP then narrowed regimen to Cefepime only based on cultures, for tracheobronchitis.  Trach Aspirate cx grew Enterobacter aerogenes: sensitve to Cefepime, Cipro, Imipenem; Resistant to cefazolin, zosyn .   Today pharmacist consulted to change to oral antibiotic, levofloxacin as pt is taking po's and in anticipation of discharge.   Afebrile, WBC 6.4k  CK D stage III. Creatinine at baseline, SCr 1.28 stable, CrCl ~ 60-70 ml/min  After levofloxacin dose given today, the urine culture resulted with  40,000 col/ml enterobacter species , resistant to levofloxacin and ampicillin; Sensitive to vancomycin.  Patient does not have UTI symptoms (per RN) and urine cx growth is <100k/ml, thus ID pharmacist recommends do not need to treat.    Plan: Cefepime discontinued today.  Started on oral levofloxacin 750mg  po x1 then 500 po q24h  Follow renal function, cultures, LOT.   Height: 5\' 6"  (167.6 cm) Weight: (!) 320 lb 8.8 oz (145.4 kg) IBW/kg (Calculated) : 59.3  Temp (24hrs), Avg:98.7 F (37.1 C), Min:98.4 F (36.9 C), Max:98.8 F (37.1 C)   Recent Labs Lab 03/06/16 1300 03/06/16 1323 03/06/16 1638 03/07/16 0516 03/08/16 0538  WBC 7.1  --   --  6.4  --   CREATININE 1.26*  --   --  1.26* 1.28*  LATICACIDVEN  --  3.01* 2.18*  --   --     Estimated Creatinine Clearance: 74.3 mL/min (by C-G formula based on SCr of 1.28 mg/dL).    Allergies  Allergen Reactions  . Buprenorphine Hcl Hives  . Morphine And Related Hives and Dermatitis    Antimicrobials this admission: 8/26 Vancomycin >>8/28 8/26 Zosyn >>8/28 8/28 Cefepime>>  Dose adjustments this admission: n/a  Microbiology  results: 8/26 blood x2>>ngtd x 3 days as of 8/29 8/26 BC x 2: NGTD 8/26 TA: GPR-- Enterobacter aerogenes:  Sens cefepime, cipro, imipenem, gent, trimeth/sulfa.  I- fortaz, rocephin,  R- ancef, zosyn.  8/26 Urine:  40,000col/ml enterococcus species: R to amp & levofloxacin; Sens to vanc   Thank you for allowing pharmacy to be a part of this patient's care. Noah Delaineuth Omid Deardorff, RPh Clinical Pharmacist Pager: [phone] 03/10/2016 12:19 PM

## 2016-03-10 NOTE — Progress Notes (Signed)
Physical Therapy Treatment Patient Details Name: Yesenia [name] MRN: [id number] DOB: 1960/11/05 Today's Date: 03/10/2016    History of Present Illness 55 y.o. female with medical history significant for obstructive  sleep apnea, status post placement of a tracheostomy site on Aug 2017, complicated by cellulitis in the trach site  requiring hospitalization, CK D, chronic pain-fibromyalgia, hyperlipidemia, hypertension, insomnia, history of stroke with right-sided weakness,morbid obesity, diabetes with polyneuropathy,GERD, discharge from the nursing home on was 15, presenting with increased work of breathing and a productive cough with yellow sputum    PT Comments    Excellent progress with endurance and functional mobility; Managing well on room air (supplemental O2 at night), and without assistive device; Lives alone, and will benefit from continued trach care training at SNF post acute stay   Follow Up Recommendations  SNF (for trach training)     Equipment Recommendations  None recommended by PT    Recommendations for Other Services       Precautions / Restrictions Precautions Precautions: None Precaution Comments: Supplemental O2 at night    Mobility  Bed Mobility Overal bed mobility: Independent                Transfers Overall transfer level: Needs assistance Equipment used: None Transfers: Sit to/from Stand Sit to Stand: Supervision         General transfer comment: Overall managing well; supervision for safety  Ambulation/Gait Ambulation/Gait assistance: Supervision Ambulation Distance (Feet): 200 Feet Assistive device: None Gait Pattern/deviations: Step-through pattern;Wide base of support     General Gait Details: Cues to self-monitor for activity tolerance; walked on room air without difficulty   Stairs            Wheelchair Mobility    Modified Rankin (Stroke Patients Only)       Balance             Standing balance-Leahy  Scale:  (approaching good)                      Cognition Arousal/Alertness: Awake/alert Behavior During Therapy: WFL for tasks assessed/performed Overall Cognitive Status: Within Functional Limits for tasks assessed                      Exercises      General Comments General comments (skin integrity, edema, etc.): session conducted on room air      Pertinent Vitals/Pain Pain Assessment: No/denies pain    Home Living                      Prior Function            PT Goals (current goals can now be found in the care plan section) Acute Rehab PT Goals Patient Stated Goal: regain stamina PT Goal Formulation: With patient Time For Goal Achievement: 03/19/16 Potential to Achieve Goals: Good Progress towards PT goals: Progressing toward goals (highly possible she will meet PT goals next session)    Frequency  Min 3X/week    PT Plan Current plan remains appropriate    Co-evaluation             End of Session   Activity Tolerance: Patient tolerated treatment well Patient left: in chair;with call bell/phone within reach     Time: 1400-1410 PT Time Calculation (min) (ACUTE ONLY): 10 min  Charges:  $Gait Training: 8-22 mins  G Codes:      Van Clines Hamff 03/10/2016, 3:05 PM  Van Clines, [facility]  Acute [facility] Services Pager [phone] Office [phone]

## 2016-03-11 DIAGNOSIS — E785 Hyperlipidemia, unspecified: Secondary | ICD-10-CM

## 2016-03-11 LAB — CBC WITH DIFFERENTIAL/PLATELET
Basophils Absolute: 0 10*3/uL (ref 0.0–0.1)
Basophils Relative: 0 %
EOS ABS: 0.3 10*3/uL (ref 0.0–0.7)
Eosinophils Relative: 4 %
HEMATOCRIT: 30.2 % — AB (ref 36.0–46.0)
HEMOGLOBIN: 9.4 g/dL — AB (ref 12.0–15.0)
LYMPHS ABS: 2.9 10*3/uL (ref 0.7–4.0)
LYMPHS PCT: 40 %
MCH: 27.5 pg (ref 26.0–34.0)
MCHC: 31.1 g/dL (ref 30.0–36.0)
MCV: 88.3 fL (ref 78.0–100.0)
MONOS PCT: 7 %
Monocytes Absolute: 0.5 10*3/uL (ref 0.1–1.0)
NEUTROS ABS: 3.6 10*3/uL (ref 1.7–7.7)
NEUTROS PCT: 49 %
Platelets: 294 10*3/uL (ref 150–400)
RBC: 3.42 MIL/uL — ABNORMAL LOW (ref 3.87–5.11)
RDW: 14.7 % (ref 11.5–15.5)
WBC: 7.4 10*3/uL (ref 4.0–10.5)

## 2016-03-11 LAB — GLUCOSE, CAPILLARY
GLUCOSE-CAPILLARY: 105 mg/dL — AB (ref 65–99)
Glucose-Capillary: 100 mg/dL — ABNORMAL HIGH (ref 65–99)
Glucose-Capillary: 120 mg/dL — ABNORMAL HIGH (ref 65–99)

## 2016-03-11 LAB — COMPREHENSIVE METABOLIC PANEL
ALT: 23 U/L (ref 14–54)
AST: 24 U/L (ref 15–41)
Albumin: 2.9 g/dL — ABNORMAL LOW (ref 3.5–5.0)
Alkaline Phosphatase: 53 U/L (ref 38–126)
Anion gap: 5 (ref 5–15)
BUN: 13 mg/dL (ref 6–20)
CHLORIDE: 110 mmol/L (ref 101–111)
CO2: 26 mmol/L (ref 22–32)
CREATININE: 1.11 mg/dL — AB (ref 0.44–1.00)
Calcium: 8.9 mg/dL (ref 8.9–10.3)
GFR calc non Af Amer: 55 mL/min — ABNORMAL LOW (ref >60)
Glucose, Bld: 106 mg/dL — ABNORMAL HIGH (ref 65–99)
POTASSIUM: 3.9 mmol/L (ref 3.5–5.1)
SODIUM: 141 mmol/L (ref 135–145)
Total Bilirubin: 0.3 mg/dL (ref 0.3–1.2)
Total Protein: 6 g/dL — ABNORMAL LOW (ref 6.5–8.1)

## 2016-03-11 LAB — CULTURE, BLOOD (ROUTINE X 2)
CULTURE: NO GROWTH
CULTURE: NO GROWTH

## 2016-03-11 MED ORDER — TRAZODONE HCL 150 MG PO TABS: 300.0000 mg | ORAL_TABLET | Freq: Every day | ORAL | 0 refills | Status: DC

## 2016-03-11 MED ORDER — HYDROCHLOROTHIAZIDE 25 MG PO TABS: 12.5000 mg | ORAL_TABLET | Freq: Every day | ORAL | 3 refills | Status: DC

## 2016-03-11 MED ORDER — LEVOFLOXACIN 500 MG PO TABS: 500.0000 mg | ORAL_TABLET | Freq: Every day | ORAL | 0 refills | Status: DC

## 2016-03-11 MED ORDER — GLIPIZIDE ER 2.5 MG PO TB24: 2.5000 mg | ORAL_TABLET | Freq: Every day | ORAL | 0 refills | Status: DC

## 2016-03-11 MED ORDER — DM-GUAIFENESIN ER 30-600 MG PO TB12: 1.0000 | ORAL_TABLET | Freq: Two times a day (BID) | ORAL | 0 refills | Status: DC

## 2016-03-11 MED ORDER — HYDROCODONE-ACETAMINOPHEN 5-325 MG PO TABS: 1.0000 | ORAL_TABLET | ORAL | 0 refills | Status: DC | PRN

## 2016-03-11 NOTE — Progress Notes (Signed)
This RN called [facility] to set up transportation for pt back to [facility]. Primary nurse made aware.

## 2016-03-11 NOTE — Progress Notes (Signed)
Report given to [place] RN at [facility]>

## 2016-03-11 NOTE — Discharge Summary (Addendum)
Physician Discharge Summary  Yesenia [name] WGN:[id number] DOB: April 09, 1961 DOA: 03/06/2016  PCP: Egbert Garibaldi, NP  Admit date: 03/06/2016 Discharge date: 03/11/2016  Time spent: 35 minutes  Recommendations for Outpatient Follow-up:  1. Complete 5 more days of po levaquin 03/16/16  2. Patient will need CBC + differential, renal panel and chest x-ray in about one week  3. Recommend outpatient bariatric follow-up regarding severe obesity 4. Continued medication needed at nursing facility regarding oxygen needs at night and will need oxygen at FiO2 of 21% on discharge 5. Please note changes to medications in terms of hydrochlorothiazide etc. Etc. 6. Patient was started on glipizide 2.5 mg given slightly elevated blood sugars, will need up titration of the same as an outpatient depending on lab work and creatinine-suggest outpatient A1c 7. Please keep follow-up appointment with Dr. Jenne Pane as an outpatient for further management of tracheotomy  8. Might need work-up for irritable bowel   Discharge Diagnoses:  Active Problems:   Diabetes mellitus, type 2 (HCC)   Dyslipidemia   Acute kidney injury superimposed on chronic kidney disease (HCC)   GERD (gastroesophageal reflux disease)   Sleep apnea   CKD (chronic kidney disease) stage 3, GFR 30-59 ml/min   HCAP (healthcare-associated pneumonia)   Discharge Condition patient is improve  diet recommendation: hh low salt caloire restrict if possible  [facility] Weights   03/08/16 2038 03/09/16 2031 03/10/16 2048  Weight: (!) 144.5 kg (318 lb 9 oz) (!) 145.4 kg (320 lb 8.8 oz) (!) 146.3 kg (322 lb 8.5 oz)    History of present illness:  55 y/o ?  PMH of  HTN, hyperlipidemia CVA CK D Severe obstructive sleep apnea who underwent tracheotomy placement sometime in early August This was complicated with cellulitis of trach site, presented with productive cough, shortness of breath.  The patient was then found to have pneumonia and  tracheobronchitis and admitted  Hospital Course:  1. Acute hypoxic respiratory failure due to early tracheobronchitis Cultures growing Enterobacter.  Tracheostomy placed a few weeks ago by Dr. Jenne Pane ENT for sleep apnea,  chest x-ray stable,  patient is now afebrile  Antibiotics adjusted To Levaquin by mouth Appreciate evaluation by Dr. Jenne Pane 03/09/16 We have informed our schedulers to please make an appointment as an outpatient with Dr. Jenne Pane in the next 2 weeks which will have to be kept for further management of the tracheotomy  2. CK D stage III. Creatinine at baseline.  3. Hypokalemia. Replaced and stable. Hypertension HCTZ 25 daily held on admission, restart 12.5 daily on discharge Might account for hypokalemia  4. SuperMorbid obesity with obstructive sleep apnea and obesity related hypoventilation syndrome.  Body mass index is 51.74 kg/m. Status post tracheostomy, follow with PCP for weight loss.  5. GERD. On PPI.  6. 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 Depression Continue BuSpar 15 twice a day, Lexapro 5 daily, trazodone 300 daily at bedtime   Consultations: Dr. Christia Reading of ENT on 03/09/2016  Discharge Exam: Vitals:   03/11/16 0424 03/11/16 0753  BP: 114/66   Pulse: 65 83  Resp: 18 16  Temp: 98.2 F (36.8 C)    Feeling fair  No issues Some diarrhea overnight but no other acute issues   General: alert pleasant oriented Cardiovascular: s1 s2 no m/r/g Respiratory: clear no added sound  Discharge Instructions    Current Discharge Medication List    START taking these medications   Details  dextromethorphan-guaiFENesin (MUCINEX DM) 30-600 MG 12hr tablet Take 1 tablet by mouth 2 (two) times daily. Qty: 30 tablet, Refills: 0    glipiZIDE (GLUCOTROL XL) 2.5 MG 24 hr tablet Take 1 tablet  (2.5 mg total) by mouth daily with breakfast. Qty: 30 tablet, Refills: 0    levofloxacin (LEVAQUIN) 500 MG tablet Take 1 tablet (500 mg total) by mouth daily. Qty: 5 tablet, Refills: 0      CONTINUE these medications which have CHANGED   Details  hydrochlorothiazide (HYDRODIURIL) 25 MG tablet Take 0.5 tablets (12.5 mg total) by mouth daily. Qty: 30 tablet, Refills: 3    HYDROcodone-acetaminophen (NORCO/VICODIN) 5-325 MG tablet Take 1-2 tablets by mouth every 4 (four) hours as needed for moderate pain or severe pain. DO NOT EXCEED 3GM OF APAP IN 24 HOURS FROM ALL SOURCES Qty: 7 tablet, Refills: 0    traZODone (DESYREL) 150 MG tablet Take 2 tablets (300 mg total) by mouth at bedtime. Qty: 5 tablet, Refills: 0      CONTINUE these medications which have NOT CHANGED   Details  albuterol (PROVENTIL HFA;VENTOLIN HFA) 108 (90 BASE) MCG/ACT inhaler Inhale 2 puffs into the lungs every 6 (six) hours as needed for wheezing.    bacitracin ointment Apply 1 application topically 2 (two) times daily. To trach site Qty: 120 g, Refills: 0    benzonatate (TESSALON) 100 MG capsule Take 1 capsule (100 mg total) by mouth 3 (three) times daily as needed for cough. Qty: 20 capsule, Refills: 0    bisacodyl (DULCOLAX) 5 MG EC tablet Take 1 tablet (5 mg total) by mouth daily as needed for moderate constipation. Qty: 30 tablet, Refills: 0    busPIRone (BUSPAR) 15 MG tablet Take 15 mg by mouth 2 (two) times daily.    clonazePAM (KLONOPIN) 0.5 MG tablet Take 0.5 mg by mouth at bedtime.    escitalopram (LEXAPRO) 10 MG tablet Take 5 mg by mouth daily. Take 5 mg daily x 5 days then take 10 mg daily    ferrous sulfate 325 (65 FE) MG tablet Take 325 mg by mouth daily. Refills: 6    gabapentin (NEURONTIN) 300 MG capsule Take 300 mg by mouth daily as needed. For pain    ondansetron (ZOFRAN) 4 MG tablet Take 4 mg by mouth daily. Patient states she is taking every day per patient    pantoprazole (PROTONIX) 40  MG tablet Take 1 tablet (40 mg total) by mouth daily. Qty: 30 tablet, Refills: 0    polyethylene glycol (MIRALAX / GLYCOLAX) packet Take 17 g by mouth daily as needed for mild constipation. Qty: 14 each, Refills: 0    pregabalin (LYRICA) 50 MG capsule Take 1 capsule (50 mg total) by mouth daily. Qty: 30 capsule, Refills: 5    saccharomyces boulardii (FLORASTOR) 250 MG capsule Take 250 mg by mouth 2 (two) times daily.      STOP taking these medications     cephALEXin (KEFLEX) 500 MG capsule        Allergies  Allergen Reactions  . Buprenorphine Hcl Hives  . Morphine And Related Hives and Dermatitis      The results of significant diagnostics from this hospitalization (including imaging, microbiology, ancillary and laboratory) are listed below for reference.    Significant Diagnostic Studies: Dg Chest 2 View  Result Date: 03/07/2016 CLINICAL DATA:  Pneumonia EXAM: CHEST  2  VIEW COMPARISON:  03/06/2016 FINDINGS: Tracheostomy to unchanged. Normal cardiac silhouette. Low lung volumes and mild basilar atelectasis. Lungs are clear. IMPRESSION: No acute cardiopulmonary findings. Electronically Signed   By: Genevive Bi M.D.   On: 03/07/2016 08:18   Ct Soft Tissue Neck W Contrast  Result Date: 02/22/2016 CLINICAL DATA:  Tracheostomy. Hemoptysis. Tracheostomy placed 1 week ago. Fevers and chills. EXAM: CT NECK WITH CONTRAST TECHNIQUE: Multidetector CT imaging of the neck was performed using the standard protocol following the bolus administration of intravenous contrast. CONTRAST:  75mL ISOVUE-300 IOPAMIDOL (ISOVUE-300) INJECTION 61% COMPARISON:  11/26/2015 FINDINGS: Pharynx and larynx: No mucosal or submucosal lesion visible. Possible vocal cord paresis on the right. Tracheostomy appears well positioned without evidence of placement site complication. Tracheostomy does not have extensive purchase into the trachea and 1 might consider a longer tube in the future. Salivary glands: Parotid  glands are normal. Left submandibular gland is normal. The patient has developed enlargement and indistinctness of the submandibular gland on the right with areas of internal low density and a few microcalcifications. 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 This gland appeared normal 3 months ago. Thyroid: Normal Lymph nodes: No enlarged or low-density nodes identified. Vascular: Negative Limited intracranial: Negative Visualized orbits: Normal Mastoids and visualized paranasal sinuses: Clear Skeleton: No significant finding IMPRESSION: Development of enlargement, indistinctness and low-density affecting the submandibular gland on the right consistent with sialoadenitis. Tracheostomy in place. Note that the tracheostomy does have its tip within the trachea, but only by a cm or so. Consider use of a a longer to in the future. Electronically Signed   By: Paulina Fusi M.D.   On: 02/22/2016 20:08   Ct Chest W Contrast  Result Date: 02/22/2016 CLINICAL DATA:  55 year old female with hemoptysis. Tracheostomy placement 4 days ago. Fever, throat pain, shortness of breath and chest pain. Initial encounter. EXAM: CT CHEST WITH CONTRAST TECHNIQUE: Multidetector CT imaging of the chest was performed during intravenous contrast administration. CONTRAST:  75mL ISOVUE-300 IOPAMIDOL (ISOVUE-300) INJECTION 61% COMPARISON:  Neck CT from today reported separately. Portable chest radiographs 02/21/2016 and earlier. CT Abdomen and Pelvis 04/11/2013. FINDINGS: Tracheostomy tube appears satisfactorily placed. See also neck CT from today reported separately. Large body habitus. Major airways are patent. Mild bilateral lower lobe atelectasis. Mild superimposed lung based mosaic attenuation favored to reflect gas trapping. No pleural effusion. Cardiomegaly appears stable since 2014. No pericardial effusion. No thoracic lymphadenopathy. Thoracic aorta and central pulmonary artery is are normally  enhancing. Negative visualized liver, spleen, pancreas, adrenal glands, left kidney, and bowel in the upper abdomen. No acute osseous abnormality identified. IMPRESSION: 1. Tracheostomy tube appears satisfactorily placed. See also neck CT from today reported separately. 2. No acute pulmonary findings other than mild atelectasis in gas trapping at the lung bases. 3. Stable cardiomegaly. Electronically Signed   By: Odessa Fleming M.D.   On: 02/22/2016 20:05   US Abdomen Complete  Addendum Date: 02/18/2016   ADDENDUM REPORT: 02/18/2016 10:50 ADDENDUM: As mentioned in main body of report, but not in Impression, fatty infiltration of the liver is noted. Electronically Signed   By: Lupita Raider, M.D.   On: 02/18/2016 10:50   Result Date: 02/18/2016 CLINICAL DATA:  Acute epigastric abdominal pain. EXAM: ABDOMEN ULTRASOUND COMPLETE COMPARISON:  None. FINDINGS: Gallbladder: No gallstones or wall thickening visualized. No sonographic Murphy sign noted by sonographer. Common bile duct: Diameter: 3.4 mm which is within normal limits. Liver: No focal lesion identified. Increased echogenicity is  noted suggesting fatty infiltration. IVC: No abnormality visualized. Pancreas: Visualized portion unremarkable. Tail not visualized due to overlying bowel gas. Spleen: Size and appearance within normal limits. Right Kidney: Length: 11.4 cm. 4 cm simple cyst seen in upper pole. Echogenicity within normal limits. No mass or hydronephrosis visualized. Left Kidney: Length: 10.7 cm. 1.8 cm simple cyst is noted. Echogenicity within normal limits. No mass or hydronephrosis visualized. Abdominal aorta: No aneurysm visualized. Other findings: None. IMPRESSION: Bilateral simple renal cysts. No other abnormality seen in the abdomen. Electronically Signed: By: Lupita Raider, M.D. On: 02/18/2016 10:44   Dg Chest Port 1 View  Result Date: 03/06/2016 CLINICAL DATA:  Productive cough, trach. Hx of DM, HTN,stroke. severly SOB. EXAM: PORTABLE CHEST -  1 VIEW COMPARISON:  02/22/2016 CT and previous studies FINDINGS: Tracheostomy stable. Low lung volumes. Patchy interstitial infiltrates or subsegmental atelectasis in the lung bases slightly increased since previous exam. Heart size and mediastinal contours are within normal limits. No effusion. Visualized bones unremarkable. IMPRESSION: Low volumes with some increase in bibasilar atelectasis or infiltrate. Electronically Signed   By: Corlis Leak M.D.   On: 03/06/2016 12:59   Dg Chest Port 1 View  Result Date: 02/21/2016 CLINICAL DATA:  Chest pain EXAM: PORTABLE CHEST 1 VIEW COMPARISON:  02/16/2016 FINDINGS: Cardiac shadow remains enlarged. Tracheostomy tube is noted in satisfactory position. The lungs are well aerated. Stable bibasilar changes are again seen. No new focal infiltrate is noted. IMPRESSION: Stable bibasilar atelectasis/scarring. Electronically Signed   By: Alcide Clever M.D.   On: 02/21/2016 20:01   Dg Chest Port 1 View  Result Date: 02/16/2016 CLINICAL DATA:  Cough and fever EXAM: PORTABLE CHEST 1 VIEW COMPARISON:  01/25/2011 FINDINGS: Cardiac shadow is enlarged. Tracheostomy tube is now seen in satisfactory position. Minimal atelectasis/ scarring is noted in the bases bilaterally. No focal confluent infiltrate is seen. No bony abnormality is noted. IMPRESSION: Mild bibasilar changes as described. Electronically Signed   By: Alcide Clever M.D.   On: 02/16/2016 12:38    Microbiology: Recent Results (from the past 240 hour(s))  Blood culture (routine x 2)     Status: None (Preliminary result)   Collection Time: 03/06/16  1:51 PM  Result Value Ref Range Status   Specimen Description BLOOD RIGHT ANTECUBITAL  Final   Special Requests BOTTLES DRAWN AEROBIC AND ANAEROBIC 5CC  Final   Culture NO GROWTH 4 DAYS  Final   Report Status PENDING  Incomplete  Blood culture (routine x 2)     Status: None (Preliminary result)   Collection Time: 03/06/16  2:17 PM  Result Value Ref Range Status    Specimen Description BLOOD LEFT ANTECUBITAL  Final   Special Requests BOTTLES DRAWN AEROBIC AND ANAEROBIC 5CC  Final   Culture NO GROWTH 4 DAYS  Final   Report Status PENDING  Incomplete  Culture, respiratory (NON-Expectorated)     Status: None   Collection Time: 03/06/16 10:56 PM  Result Value Ref Range Status   Specimen Description TRACHEAL ASPIRATE  Final   Special Requests NONE  Final   Gram Stain   Final    FEW WBC PRESENT, PREDOMINANTLY PMN RARE GRAM POSITIVE RODS    Culture FEW ENTEROBACTER AEROGENES  Final   Report Status 03/09/2016 FINAL  Final   Organism ID, Bacteria ENTEROBACTER AEROGENES  Final      Susceptibility   Enterobacter aerogenes - MIC*    CEFAZOLIN >=64 RESISTANT Resistant     CEFEPIME <=1 SENSITIVE Sensitive  CEFTAZIDIME 16 INTERMEDIATE Intermediate     CEFTRIAXONE 16 INTERMEDIATE Intermediate     CIPROFLOXACIN <=0.25 SENSITIVE Sensitive     GENTAMICIN <=1 SENSITIVE Sensitive     IMIPENEM 1 SENSITIVE Sensitive     TRIMETH/SULFA <=20 SENSITIVE Sensitive     PIP/TAZO >=128 RESISTANT Resistant     * FEW ENTEROBACTER AEROGENES  Urine culture     Status: Abnormal   Collection Time: 03/06/16 11:34 PM  Result Value Ref Range Status   Specimen Description URINE, RANDOM  Final   Special Requests NONE  Final   Culture 40,000 COLONIES/mL ENTEROCOCCUS SPECIES (A)  Final   Report Status 03/10/2016 FINAL  Final   Organism ID, Bacteria ENTEROCOCCUS SPECIES (A)  Final      Susceptibility   Enterococcus species - MIC*    AMPICILLIN 16 RESISTANT Resistant     LEVOFLOXACIN >=8 RESISTANT Resistant     NITROFURANTOIN 32 SENSITIVE Sensitive     VANCOMYCIN <=0.5 SENSITIVE Sensitive     * 40,000 COLONIES/mL ENTEROCOCCUS SPECIES     Labs: Basic Metabolic Panel:  Recent Labs Lab 03/06/16 1300 03/07/16 0516 03/08/16 0538 03/11/16 0333  NA 140 141 143 141  K 3.1* 3.4* 3.8 3.9  CL 105 108 110 110  CO2 26 27 26 26   GLUCOSE 110* 127* 122* 106*  BUN 16 13 13 13    CREATININE 1.26* 1.26* 1.28* 1.11*  CALCIUM 9.5 8.8* 9.0 8.9  MG  --   --  1.9  --    Liver Function Tests:  Recent Labs Lab 03/07/16 0516 03/11/16 0333  AST 16 24  ALT 16 23  ALKPHOS 60 53  BILITOT 0.6 0.3  PROT 6.5 6.0*  ALBUMIN 2.9* 2.9*   No results for input(s): LIPASE, AMYLASE in the last 168 hours. No results for input(s): AMMONIA in the last 168 hours. CBC:  Recent Labs Lab 03/06/16 1300 03/07/16 0516 03/11/16 0333  WBC 7.1 6.4 7.4  NEUTROABS  --   --  3.6  HGB 11.8* 10.3* 9.4*  HCT 36.8 32.9* 30.2*  MCV 88.0 88.4 88.3  PLT 396 311 294   Cardiac Enzymes: No results for input(s): CKTOTAL, CKMB, CKMBINDEX, TROPONINI in the last 168 hours. BNP: BNP (last 3 results) No results for input(s): BNP in the last 8760 hours.  ProBNP (last 3 results) No results for input(s): PROBNP in the last 8760 hours.  CBG:  Recent Labs Lab 03/10/16 0756 03/10/16 1147 03/10/16 1707 03/10/16 2047 03/11/16 0735  GLUCAP 112* 135* 105* 122* 100*       Signed:  Rhetta MuraSAMTANI, JAI-GURMUKH MD.  [facility] 03/11/2016, 8:40 AM

## 2016-03-11 NOTE — Care Management Note (Signed)
Case Management Note  Patient Details  Name: Yesenia [name] MRN: [id number] Date of Birth: 1960/11/03  Subjective/Objective:   CM following for progression and d/c planning.                  Action/Plan: 03/11/2016 Pt for d/c back to SNF. No HH or DME needs.   Expected Discharge Date:                  Expected Discharge Plan:  Skilled Nursing Facility  In-House Referral:  Clinical Social Work  Discharge planning Services  NA  Post Acute Care Choice:  NA Choice offered to:  NA  DME Arranged:  N/A DME Agency:  NA  HH Arranged:  NA HH Agency:  NA  Status of Service:  Completed, signed off  If discussed at Long Length of Stay Meetings, dates discussed:    Additional Comments:  Starlyn SkeansRoyal, Shabana Armentrout U, RN 03/11/2016, 2:46 PM

## 2016-03-11 NOTE — Progress Notes (Signed)
Late Entry:  RT made RN aware of missing ambu bag. RT ordered an additional ambu bag. RN upon assessment of room found two ambu bags, one previously placed in patient's room and additional one provided by RT.     Veatrice KellsMahmoud,Woodrow Dulski I, RN

## 2016-03-11 NOTE — Clinical Social Work Note (Signed)
Patient medically stable for discharge today back [facility] and nurse liaison, Edwin Cap. Blak[place] contacted and informed. Discharge clinicals transmitted to facility via HUB. Patient advised of discharge and transported back to facility via ambulance.  Genelle BalVanessa Laquon Emel, MSW, LCSW Licensed Clinical Social Worker Clinical Social Work Department [facility] [phone]

## 2016-03-16 ENCOUNTER — Non-Acute Institutional Stay (SKILLED_NURSING_FACILITY): Payer: Medicaid Other | Admitting: Adult Health

## 2016-03-16 ENCOUNTER — Other Ambulatory Visit: Payer: Self-pay | Admitting: *Deleted

## 2016-03-16 ENCOUNTER — Encounter: Payer: Self-pay | Admitting: Adult Health

## 2016-03-16 DIAGNOSIS — N183 Chronic kidney disease, stage 3 unspecified: Secondary | ICD-10-CM

## 2016-03-16 DIAGNOSIS — E1122 Type 2 diabetes mellitus with diabetic chronic kidney disease: Secondary | ICD-10-CM

## 2016-03-16 DIAGNOSIS — M797 Fibromyalgia: Secondary | ICD-10-CM | POA: Diagnosis not present

## 2016-03-16 DIAGNOSIS — D509 Iron deficiency anemia, unspecified: Secondary | ICD-10-CM | POA: Diagnosis not present

## 2016-03-16 DIAGNOSIS — I10 Essential (primary) hypertension: Secondary | ICD-10-CM

## 2016-03-16 DIAGNOSIS — J189 Pneumonia, unspecified organism: Secondary | ICD-10-CM

## 2016-03-16 DIAGNOSIS — G4730 Sleep apnea, unspecified: Secondary | ICD-10-CM | POA: Diagnosis not present

## 2016-03-16 DIAGNOSIS — I639 Cerebral infarction, unspecified: Secondary | ICD-10-CM

## 2016-03-16 MED ORDER — PREGABALIN 50 MG PO CAPS: 50.0000 mg | ORAL_CAPSULE | Freq: Every day | ORAL | 0 refills | Status: DC

## 2016-03-16 NOTE — Telephone Encounter (Signed)
[facility] #[phone] Fax:[phone]  

## 2016-03-16 NOTE — Progress Notes (Signed)
Patient ID: Yesenia [name], female   DOB: 1961/07/08, 55 y.o.   MRN: [id number]    Location:   [facility] Room Number: 116-A Place of Service:  SNF (31)   CODE STATUS:   Allergies  Allergen Reactions  . Buprenorphine Hcl Hives  . Morphine And Related Hives and Dermatitis    Chief Complaint  Patient presents with  . Hospitalization Follow-up    Hospital Follow up    HPI:  She has been hospitalized for pneumonia. She is here for short term rehab; with her goal to return back home. She tells me that she will be leaving here within the next week. She is not voicing any concerns. There are no nursing concerns at this time.    Past Medical History:  Diagnosis Date  . Anemia   . Anemia   . Chronic pain   . CKD (chronic kidney disease)   . Diabetes mellitus without complication (HCC)    diet controlled. does not check CBG's  . Fibromyalgia    hospitilized 12/16 due to inability to walk  . Hyperlipemia   . Hypertension   . Insomnia   . Neuropathy (HCC)   . Right sided weakness   . Stroke [facility]) 2014?   no residual weakness   . Vitamin deficiency    Vit D    Past Surgical History:  Procedure Laterality Date  . KNEE SURGERY  1992, 1995, 1997  . LEG SURGERY Right 2007   Surgery x3 2 arthroscopies and one rod  . OOPHORECTOMY Right 1993?  . TRACHEOSTOMY TUBE PLACEMENT N/A 02/13/2016   Procedure: TRACHEOSTOMY;  Surgeon: Christia Reading, MD;  Location: [facility] OR;  Service: ENT;  Laterality: N/A;    Social History   Social History  . Marital status: Single    Spouse name: N/A  . Number of children: 2  . Years of education: 12th    Occupational History  . [employer]    Social History Main Topics  . Smoking status: Never Smoker  . Smokeless tobacco: Never Used  . Alcohol use No  . Drug use: No  . Sexual activity: No   Other Topics Concern  . Not on file   Social History Narrative   Reports no caffeine use    Family History  Problem Relation Age of Onset  .  Breast cancer Mother   . Leukemia Brother   . Breast cancer Maternal Grandmother       VITAL SIGNS BP (!) 154/80   Pulse 68   Temp 97.2 F (36.2 C) (Oral)   Resp 20   Ht 5\' 7"  (1.702 m)   Wt (!) 318 lb (144.2 kg)   SpO2 97%   BMI 49.81 kg/m   Patient's Medications  New Prescriptions   No medications on file  Previous Medications   ALBUTEROL (PROVENTIL HFA;VENTOLIN HFA) 108 (90 BASE) MCG/ACT INHALER    Inhale 2 puffs into the lungs every 6 (six) hours as needed for wheezing.   BACITRACIN OINTMENT    Apply 1 application topically 2 (two) times daily. To trach site   BENZONATATE (TESSALON) 100 MG CAPSULE    Take 1 capsule (100 mg total) by mouth 3 (three) times daily as needed for cough.   BISACODYL (DULCOLAX) 5 MG EC TABLET    Take 1 tablet (5 mg total) by mouth daily as needed for moderate constipation.   BUSPIRONE (BUSPAR) 15 MG TABLET    Take 15 mg by mouth 2 (two) times daily.  CLONAZEPAM (KLONOPIN) 0.5 MG TABLET    Take 0.5 mg by mouth at bedtime.   DEXTROMETHORPHAN-GUAIFENESIN (MUCINEX DM) 30-600 MG 12HR TABLET    Take 1 tablet by mouth 2 (two) times daily.   ESCITALOPRAM (LEXAPRO) 10 MG TABLET    Take 5 mg by mouth daily. Take 5 mg daily x 5 days then take 10 mg daily   FERROUS SULFATE 325 (65 FE) MG TABLET    Take 325 mg by mouth daily.   GABAPENTIN (NEURONTIN) 300 MG CAPSULE    Take 300 mg by mouth daily as needed. For pain   GLIPIZIDE (GLUCOTROL XL) 2.5 MG 24 HR TABLET    Take 1 tablet (2.5 mg total) by mouth daily with breakfast.   HYDROCHLOROTHIAZIDE (HYDRODIURIL) 25 MG TABLET    Take 0.5 tablets (12.5 mg total) by mouth daily.   HYDROCODONE-ACETAMINOPHEN (NORCO/VICODIN) 5-325 MG TABLET    Take 1-2 tablets by mouth every 4 (four) hours as needed for moderate pain or severe pain. DO NOT EXCEED 3GM OF APAP IN 24 HOURS FROM ALL SOURCES   LEVOFLOXACIN (LEVAQUIN) 500 MG TABLET    Take 1 tablet (500 mg total) by mouth daily.   ONDANSETRON (ZOFRAN) 4 MG TABLET    Take 4 mg by  mouth daily. Patient states she is taking every day per patient   PANTOPRAZOLE (PROTONIX) 40 MG TABLET    Take 1 tablet (40 mg total) by mouth daily.   POLYETHYLENE GLYCOL (MIRALAX / GLYCOLAX) PACKET    Take 17 g by mouth daily as needed for mild constipation.   PREGABALIN (LYRICA) 50 MG CAPSULE    Take 1 capsule (50 mg total) by mouth daily.   SACCHAROMYCES BOULARDII (FLORASTOR) 250 MG CAPSULE    Take 250 mg by mouth 2 (two) times daily.   TRAZODONE (DESYREL) 150 MG TABLET    Take 2 tablets (300 mg total) by mouth at bedtime.  Modified Medications   No medications on file  Discontinued Medications   No medications on file     SIGNIFICANT DIAGNOSTIC EXAMS  07-06-15: bilateral hip and pelvis x-ray; Negative exam.  07-06-15: ct scan of  left hip: No evidence of fracture or dislocation. No significant joint effusion. No definite findings seen to suggest septic arthritis at this time  07-17-15: left elbow x-ray: 1. No evidence of acute fracture or dislocation. 2. Several small osseous fragments overlying the medial epicondyle, likely reflecting remote injury.  03-06-16: chest x-ray: Low volumes with some increase in bibasilar atelectasis or infiltrate.  03-07-16: chest x-ray: No acute cardiopulmonary findings.    LABS REVIEWED:   07-07-15: wbc 9.2; hgb 9.8; hct 30.7; mcv 89.5; plt 260 glucose 123; bun 17; creat 1.29; k+ 3.3; na++142; vitamin B12: 552; folate 9.2; iron 71; TIBC 253 07-09-15: wbc 7.7; hgb 9.7; hct 30.6; mcv 88.7; ;plt 290; glucose 118; bun 14; creat 0.99; k+ 3.6; na++142 07-17-15: wbc 10.0; hgb 11.3; hct 33.9; mcv 88.1; plt 318; glucose 112; bun 16; creat 1.13; k+ 3.7; na++142; sed rate 60; CRP 3.1  02-05-16: hgb a1c 6.1 03-06-16: wbc 7.1;hb 11.8; hct 36.8;mcv 88.0; plt 396; glucose 110; bun 16; creat 1.26; k+ 3.1; na++ 140; urine culture: 40,000 enterococcus 03-11-16: wbc 7.4; hgb 9.4; hct 30.2; mcv 88.3; plt 294; glucose 106; bun 13; creat 1.11; k+ 3.9; na++ 141; liver normal  albumin 2.9     Review of Systems  Constitutional: Negative for malaise/fatigue.  Respiratory: Negative for cough and shortness of breath.   Cardiovascular: Negative  for chest pain, palpitations and leg swelling.  Gastrointestinal: negative for diarrhea Negative for heartburn, nausea, abdominal pain, constipation and blood in stool.  Musculoskeletal: Negative for myalgias and joint pain.       Pain is being managed   Skin: Negative.   Neurological: Negative for headaches.  Psychiatric/Behavioral: The patient is not nervous/anxious.     Physical Exam  Constitutional: She is oriented to person, place, and time. She appears well-developed and well-nourished. No distress.  Morbidly obese   Eyes: Conjunctivae are normal.  Neck: Neck supple. No JVD present. No thyromegaly present.  Has trach  Cardiovascular: Normal rate, regular rhythm and intact distal pulses.   Respiratory: Effort normal and breath sounds normal. No respiratory distress. She has no wheezes.  GI: Soft. Bowel sounds are normal. She exhibits no distension. There is no tenderness.  Musculoskeletal: She exhibits no edema.  Able to move all extremities   Lymphadenopathy:    She has no cervical adenopathy.  Neurological: She is alert and oriented to person, place, and time.  Skin: Skin is warm and dry. She is not diaphoretic.  Psychiatric: She has a normal mood and affect.     ASSESSMENT/ PLAN:  1. Hypertension: will continue hctz 12.5 mg daily   2. GERD: will continue protonix 40 mg daily   3. Constipation: will continue miralax daily as needed  4. CVA: is neurologically stable; will not make changes will monitor   5. Fibromyalgia; will continue lyrica 50 mg daily and has neurontin 300 mg daily as needed  6. CKD stage III: bun/creat 13/1.11; will monitor  7. OSA: is status post trach; will monitor  8. Pneumonia: will complete levaquin; will continue mucinex DM twice daily   9. Diabetes: hgb a1c 6.1; will  continue glucotrol xl 2.5 mg daily   10. Anemia: hgb 9.4; will continue iron daily   11. Depression with anxiety: will continue lexapro 10 mg daily will continue trazodone 300 mg nightly will change her klonopin to 0.5 mg nightly for anxiety    Will check cbc; cmp   Time spent with patient  50  minutes >50% time spent counseling; reviewing medical record; tests; labs; and developing future plan of care   MD is aware of resident's narcotic use and is in agreement with current plan of care. We will attempt to wean resident as apropriate   Synthia Innocenteborah Demarus Latterell NP [facility]  Contact [phone] Monday through Friday 8am- 5pm  After hours call [phone]

## 2016-03-18 ENCOUNTER — Encounter: Payer: Self-pay | Admitting: Internal Medicine

## 2016-03-18 ENCOUNTER — Emergency Department (HOSPITAL_COMMUNITY)
Admission: EM | Admit: 2016-03-18 | Discharge: 2016-03-19 | Disposition: A | Discharge status: Home / Self Care | Payer: Medicaid Other | Attending: Emergency Medicine | Admitting: Emergency Medicine

## 2016-03-18 ENCOUNTER — Emergency Department (HOSPITAL_COMMUNITY): Payer: Medicaid Other

## 2016-03-18 ENCOUNTER — Encounter (HOSPITAL_COMMUNITY): Payer: Self-pay | Admitting: Emergency Medicine

## 2016-03-18 ENCOUNTER — Non-Acute Institutional Stay (SKILLED_NURSING_FACILITY): Payer: Medicaid Other | Admitting: Internal Medicine

## 2016-03-18 DIAGNOSIS — R059 Cough, unspecified: Secondary | ICD-10-CM

## 2016-03-18 DIAGNOSIS — R079 Chest pain, unspecified: Secondary | ICD-10-CM | POA: Diagnosis not present

## 2016-03-18 DIAGNOSIS — I129 Hypertensive chronic kidney disease with stage 1 through stage 4 chronic kidney disease, or unspecified chronic kidney disease: Secondary | ICD-10-CM | POA: Diagnosis not present

## 2016-03-18 DIAGNOSIS — Z7984 Long term (current) use of oral hypoglycemic drugs: Secondary | ICD-10-CM | POA: Insufficient documentation

## 2016-03-18 DIAGNOSIS — K589 Irritable bowel syndrome without diarrhea: Secondary | ICD-10-CM | POA: Diagnosis not present

## 2016-03-18 DIAGNOSIS — D509 Iron deficiency anemia, unspecified: Secondary | ICD-10-CM

## 2016-03-18 DIAGNOSIS — E1140 Type 2 diabetes mellitus with diabetic neuropathy, unspecified: Secondary | ICD-10-CM | POA: Diagnosis not present

## 2016-03-18 DIAGNOSIS — G894 Chronic pain syndrome: Secondary | ICD-10-CM

## 2016-03-18 DIAGNOSIS — F411 Generalized anxiety disorder: Secondary | ICD-10-CM | POA: Diagnosis not present

## 2016-03-18 DIAGNOSIS — I10 Essential (primary) hypertension: Secondary | ICD-10-CM | POA: Diagnosis not present

## 2016-03-18 DIAGNOSIS — E1122 Type 2 diabetes mellitus with diabetic chronic kidney disease: Secondary | ICD-10-CM | POA: Insufficient documentation

## 2016-03-18 DIAGNOSIS — Z930 Tracheostomy status: Secondary | ICD-10-CM | POA: Diagnosis not present

## 2016-03-18 DIAGNOSIS — G4730 Sleep apnea, unspecified: Secondary | ICD-10-CM

## 2016-03-18 DIAGNOSIS — G629 Polyneuropathy, unspecified: Secondary | ICD-10-CM

## 2016-03-18 DIAGNOSIS — Z8673 Personal history of transient ischemic attack (TIA), and cerebral infarction without residual deficits: Secondary | ICD-10-CM | POA: Diagnosis not present

## 2016-03-18 DIAGNOSIS — D649 Anemia, unspecified: Secondary | ICD-10-CM | POA: Diagnosis not present

## 2016-03-18 DIAGNOSIS — J9811 Atelectasis: Secondary | ICD-10-CM | POA: Diagnosis not present

## 2016-03-18 DIAGNOSIS — M797 Fibromyalgia: Secondary | ICD-10-CM | POA: Diagnosis not present

## 2016-03-18 DIAGNOSIS — N183 Chronic kidney disease, stage 3 (moderate): Secondary | ICD-10-CM | POA: Diagnosis not present

## 2016-03-18 DIAGNOSIS — R05 Cough: Secondary | ICD-10-CM

## 2016-03-18 LAB — HEPATIC FUNCTION PANEL
ALT: 16 U/L (ref 7–35)
AST: 13 U/L (ref 13–35)
Alkaline Phosphatase: 63 U/L (ref 25–125)
Bilirubin, Total: 0.3 mg/dL

## 2016-03-18 LAB — CBC WITH DIFFERENTIAL/PLATELET
BASOS ABS: 0.1 10*3/uL (ref 0.0–0.1)
BASOS PCT: 1 %
Eosinophils Absolute: 0.2 10*3/uL (ref 0.0–0.7)
Eosinophils Relative: 3 %
HEMATOCRIT: 34.5 % — AB (ref 36.0–46.0)
HEMOGLOBIN: 11 g/dL — AB (ref 12.0–15.0)
LYMPHS PCT: 40 %
Lymphs Abs: 3.2 10*3/uL (ref 0.7–4.0)
MCH: 28.1 pg (ref 26.0–34.0)
MCHC: 31.9 g/dL (ref 30.0–36.0)
MCV: 88.2 fL (ref 78.0–100.0)
MONO ABS: 0.7 10*3/uL (ref 0.1–1.0)
Monocytes Relative: 8 %
NEUTROS ABS: 3.9 10*3/uL (ref 1.7–7.7)
NEUTROS PCT: 48 %
Platelets: 298 10*3/uL (ref 150–400)
RBC: 3.91 MIL/uL (ref 3.87–5.11)
RDW: 15.2 % (ref 11.5–15.5)
WBC: 8 10*3/uL (ref 4.0–10.5)

## 2016-03-18 LAB — I-STAT CG4 LACTIC ACID, ED: Lactic Acid, Venous: 0.85 mmol/L (ref 0.5–1.9)

## 2016-03-18 LAB — BASIC METABOLIC PANEL
BUN: 20 mg/dL (ref 4–21)
CREATININE: 1.2 mg/dL — AB (ref 0.5–1.1)
GLUCOSE: 83 mg/dL
POTASSIUM: 3.9 mmol/L (ref 3.4–5.3)
SODIUM: 144 mmol/L (ref 137–147)

## 2016-03-18 LAB — COMPREHENSIVE METABOLIC PANEL
ALBUMIN: 3.5 g/dL (ref 3.5–5.0)
ALT: 21 U/L (ref 14–54)
AST: 21 U/L (ref 15–41)
Alkaline Phosphatase: 59 U/L (ref 38–126)
Anion gap: 7 (ref 5–15)
BILIRUBIN TOTAL: 0.4 mg/dL (ref 0.3–1.2)
BUN: 17 mg/dL (ref 6–20)
CO2: 26 mmol/L (ref 22–32)
CREATININE: 1.24 mg/dL — AB (ref 0.44–1.00)
Calcium: 9 mg/dL (ref 8.9–10.3)
Chloride: 107 mmol/L (ref 101–111)
GFR calc Af Amer: 56 mL/min — ABNORMAL LOW (ref >60)
GFR, EST NON AFRICAN AMERICAN: 48 mL/min — AB (ref >60)
GLUCOSE: 97 mg/dL (ref 65–99)
POTASSIUM: 3.8 mmol/L (ref 3.5–5.1)
Sodium: 140 mmol/L (ref 135–145)
TOTAL PROTEIN: 6.7 g/dL (ref 6.5–8.1)

## 2016-03-18 LAB — CBC AND DIFFERENTIAL
HCT: 32 % — AB (ref 36–46)
Hemoglobin: 10.7 g/dL — AB (ref 12.0–16.0)
Neutrophils Absolute: 2 /uL
Platelets: 316 10*3/uL (ref 150–399)
WBC: 5.8 10^3/mL

## 2016-03-18 LAB — I-STAT TROPONIN, ED: Troponin i, poc: 0 ng/mL (ref 0.00–0.08)

## 2016-03-18 MED ORDER — IOPAMIDOL (ISOVUE-370) INJECTION 76%
INTRAVENOUS | Status: AC
  Administered 2016-03-18: 100 mL
  Filled 2016-03-18: qty 100

## 2016-03-18 MED ORDER — IOPAMIDOL (ISOVUE-370) INJECTION 76%
INTRAVENOUS | Status: AC
  Filled 2016-03-18: qty 100

## 2016-03-18 NOTE — ED Provider Notes (Signed)
[facility]-EMERGENCY DEPT Provider Note   CSN: [id number] Arrival date & time: 03/18/16  1954     History   Chief Complaint Chief Complaint  Patient presents with  . Chest Pain  . Pneumonia    HPI Yesenia [name] is a 55 y.o. female with a hx of anemia, CKD, Diabetes Type 2, fibromyalgia, morbid obesity, S/p trach placement in Early Aug 2017 for severe sleep apnea complicated by a local cellulitis and HCAP presents to the Emergency Department complaining of gradual, persistent, progressively worsening chest pain rated at an 8/10 onset 4 days ago. She reports that it is sharp and constant in both the front and back. She reports that the chest pain worsened today after a severe coughing fit.  Record Review shows that patient was admitted in late August for age And discharged on 03/07/2016 with Levaquin. She reports she finished her antibiotics yesterday.  She reports that chest x-ray from [facility], her SNF, showed persistent pneumonia.  Patient denies fever or chills, nausea or vomiting. She reports that her trach site is healing well. She reports that she is here today because she was sent by her SNF.  She reports that exertion makes her coughing worse. Nothing makes it better. She denies a history of asthma or COPD but does have an inhaler for "wheezing."  Pt reports she is a never smoker.   The history is provided by the patient and medical records. No language interpreter was used.    Past Medical History:  Diagnosis Date  . Anemia   . Anemia   . Chronic pain   . CKD (chronic kidney disease)   . Diabetes mellitus without complication (HCC)    diet controlled. does not check CBG's  . Fibromyalgia    hospitilized 12/16 due to inability to walk  . Hyperlipemia   . Hypertension   . Insomnia   . Neuropathy (HCC)   . Right sided weakness   . Stroke [facility](HCC) 2014?   no residual weakness   . Vitamin deficiency    Vit D    Patient Active Problem List   Diagnosis Date Noted  . HCAP  (healthcare-associated pneumonia) 03/06/2016  . Fibromyalgia 02/22/2016  . Cellulitis 02/21/2016  . Abdominal pain 02/16/2016  . Constipation 02/16/2016  . CKD (chronic kidney disease) stage 3, GFR 30-59 ml/min 02/16/2016  . Fever   . Cough   . Sleep apnea 02/13/2016  . Insomnia 08/10/2015  . Polyneuropathy (HCC) 07/10/2015  . GERD (gastroesophageal reflux disease) 07/10/2015  . Acute kidney injury superimposed on chronic kidney disease (HCC) 07/07/2015  . Gait disturbance 07/07/2015  . Morbid obesity (HCC) 07/07/2015  . Left hip pain 07/06/2015  . Chronic pain   . Right sided weakness   . Headache(784.0) 04/14/2013  . Encephalopathy acute 04/11/2013  . Acute pyelonephritis 04/11/2013  . Diabetes mellitus, type 2 (HCC) 04/11/2013  . Dyslipidemia 04/11/2013  . Chest pain 02/15/2013  . CVA (cerebral infarction) 01/14/2013  . HTN (hypertension) 01/14/2013  . Anemia 01/14/2013    Past Surgical History:  Procedure Laterality Date  . KNEE SURGERY  1992, 1995, 1997  . LEG SURGERY Right 2007   Surgery x3 2 arthroscopies and one rod  . OOPHORECTOMY Right 1993?  . TRACHEOSTOMY TUBE PLACEMENT N/A 02/13/2016   Procedure: TRACHEOSTOMY;  Surgeon: Christia Readingwight Bates, MD;  [facility];  Service: ENT;  Laterality: N/A;    OB History    No data available       Home Medications  Prior to Admission medications   Medication Sig Start Date End Date Taking? Authorizing Provider  albuterol (PROVENTIL HFA;VENTOLIN HFA) 108 (90 BASE) MCG/ACT inhaler Inhale 2 puffs into the lungs every 6 (six) hours as needed for wheezing.   Yes Historical Provider, MD  bisacodyl (DULCOLAX) 5 MG EC tablet Take 1 tablet (5 mg total) by mouth daily as needed for moderate constipation. 02/25/16  Yes Maryann Mikhail, DO  busPIRone (BUSPAR) 15 MG tablet Take 15 mg by mouth 2 (two) times daily.   Yes Historical Provider, MD  clonazePAM (KLONOPIN) 0.5 MG tablet Take 0.5 mg by mouth at bedtime.   Yes Historical  Provider, MD  dextromethorphan-guaiFENesin (MUCINEX DM) 30-600 MG 12hr tablet Take 1 tablet by mouth 2 (two) times daily. 03/11/16  Yes Rhetta Mura, MD  escitalopram (LEXAPRO) 10 MG tablet Take 5 mg by mouth daily. Take 5 mg daily x 5 days then take 10 mg daily   Yes Historical Provider, MD  ferrous sulfate 325 (65 FE) MG tablet Take 325 mg by mouth daily. 09/13/14  Yes Historical Provider, MD  gabapentin (NEURONTIN) 300 MG capsule Take 300 mg by mouth daily as needed. For pain 10/29/15  Yes Historical Provider, MD  glipiZIDE (GLUCOTROL XL) 2.5 MG 24 hr tablet Take 1 tablet (2.5 mg total) by mouth daily with breakfast. 03/11/16  Yes Rhetta Mura, MD  hydrochlorothiazide (HYDRODIURIL) 25 MG tablet Take 0.5 tablets (12.5 mg total) by mouth daily. 03/11/16  Yes Rhetta Mura, MD  HYDROcodone-acetaminophen (NORCO/VICODIN) 5-325 MG tablet Take 1-2 tablets by mouth every 4 (four) hours as needed for moderate pain or severe pain. DO NOT EXCEED 3GM OF APAP IN 24 HOURS FROM ALL SOURCES 03/11/16  Yes Rhetta Mura, MD  hyoscyamine (ANASPAZ) 0.125 MG TBDP disintergrating tablet Place 0.125 mg under the tongue 3 (three) times daily as needed. For Gas-esophageal  Reflux   Yes Historical Provider, MD  ondansetron (ZOFRAN) 4 MG tablet Take 4 mg by mouth daily. Patient states she is taking every day per patient   Yes Historical Provider, MD  pantoprazole (PROTONIX) 40 MG tablet Take 1 tablet (40 mg total) by mouth daily. 02/20/16  Yes Christia Reading, MD  polyethylene glycol (MIRALAX / GLYCOLAX) packet Take 17 g by mouth daily as needed for mild constipation. 02/25/16  Yes Maryann Mikhail, DO  pregabalin (LYRICA) 50 MG capsule Take 1 capsule (50 mg total) by mouth daily. 03/16/16  Yes Sharon Seller, NP  saccharomyces boulardii (FLORASTOR) 250 MG capsule Take 250 mg by mouth 2 (two) times daily.   Yes Historical Provider, MD  traZODone (DESYREL) 150 MG tablet Take 150 mg by mouth at bedtime.   Yes  Historical Provider, MD  bacitracin ointment Apply 1 application topically 2 (two) times daily. To trach site 02/20/16   Christia Reading, MD  benzonatate (TESSALON) 100 MG capsule Take 1 capsule (100 mg total) by mouth every 8 (eight) hours. 03/19/16   [facility]ia Client Alana Dayton, PA-C    Family History Family History  Problem Relation Age of Onset  . Breast cancer Mother   . Leukemia Brother   . Breast cancer Maternal Grandmother     Social History Social History  Substance Use Topics  . Smoking status: Never Smoker  . Smokeless tobacco: Never Used  . Alcohol use No     Allergies   Buprenorphine hcl and Morphine and related   Review of Systems Review of Systems  Constitutional: Positive for fatigue.  Respiratory: Positive for cough and shortness of breath.  Cardiovascular: Positive for chest pain.  All other systems reviewed and are negative.    Physical Exam Updated Vital Signs BP 115/82 (BP Location: Left Arm)   Pulse 62   Temp 98.5 F (36.9 C) (Oral)   Resp 18   Ht 5\' 6"  (1.676 m)   Wt (!) 141.1 kg   SpO2 99%   BMI 50.20 kg/m   Physical Exam  Constitutional: She appears well-developed and well-nourished. No distress.  Awake, alert, nontoxic appearance  HENT:  Head: Normocephalic and atraumatic.  Mouth/Throat: Oropharynx is clear and moist. No oropharyngeal exudate.  Eyes: Conjunctivae are normal. No scleral icterus.  Neck: Normal range of motion. Neck supple.  Janina Mayo site is clean without erythema or evidence of cellulitis  Cardiovascular: Normal rate, regular rhythm and intact distal pulses.   Pulmonary/Chest: Effort normal. No respiratory distress. She has decreased breath sounds. She has wheezes (fine, expiratory wheezes). She has no rhonchi.  Equal chest expansion Persistent dry cough during exam  Abdominal: Soft. Bowel sounds are normal. She exhibits no mass. There is no tenderness. There is no rebound and no guarding.  Musculoskeletal: Normal range of motion.  She exhibits no edema.  Neurological: She is alert.  Speech is clear and goal oriented Moves extremities without ataxia  Skin: Skin is warm and dry. She is not diaphoretic.  Psychiatric: She has a normal mood and affect.  Nursing note and vitals reviewed.    ED Treatments / Results  Labs (all labs ordered are listed, but only abnormal results are displayed) Labs Reviewed  CBC WITH DIFFERENTIAL/PLATELET - Abnormal; Notable for the following:       Result Value   Hemoglobin 11.0 (*)    HCT 34.5 (*)    All other components within normal limits  COMPREHENSIVE METABOLIC PANEL - Abnormal; Notable for the following:    Creatinine, Ser 1.24 (*)    GFR calc non Af Amer 48 (*)    GFR calc Af Amer 56 (*)    All other components within normal limits  I-STAT CG4 LACTIC ACID, ED  I-STAT TROPOININ, ED    EKG  EKG Interpretation  Date/Time:  Thursday March 18 2016 20:04:37 EDT Ventricular Rate:  61 PR Interval:    QRS Duration: 104 QT Interval:  464 QTC Calculation: 468 R Axis:   7 Text Interpretation:  Sinus rhythm Abnormal R-wave progression, early transition Left ventricular hypertrophy Confirmed by Fayrene Fearing  MD, MARK ([id number]) on 03/18/2016 11:48:41 PM       Radiology Dg Chest 2 View  Result Date: 03/18/2016 CLINICAL DATA:  Shortness of breath, chest pain EXAM: CHEST  2 VIEW COMPARISON:  03/07/2016 FINDINGS: Cardiomediastinal silhouette is unremarkable. Tracheostomy tube is unchanged in position. No pulmonary edema. There is streaky right basilar atelectasis or infiltrate. Bony thorax is stable. IMPRESSION: No pulmonary edema. Streaky right basilar atelectasis or early infiltrate. Electronically Signed   By: Natasha Mead M.D.   On: 03/18/2016 21:09   Ct Angio Chest Pe W Or Wo Contrast  Result Date: 03/19/2016 CLINICAL DATA:  Acute onset of generalized chest pain and cough. Initial encounter. EXAM: CT ANGIOGRAPHY CHEST WITH CONTRAST TECHNIQUE: Multidetector CT imaging of the chest was  performed using the standard protocol during bolus administration of intravenous contrast. Multiplanar CT image reconstructions and MIPs were obtained to evaluate the vascular anatomy. The study was repeated, as the initial study did not trigger automatic contrast injection. CONTRAST:  161 mL of Isovue 370 IV contrast COMPARISON:  Chest radiograph performed earlier  today 8:31 p.m., and CT of the chest performed 02/22/2016 FINDINGS: There is no evidence of pulmonary embolus. Mild bibasilar atelectasis is noted. The lungs are otherwise clear. There is no evidence of significant focal consolidation, pleural effusion or pneumothorax. No masses are identified; no abnormal focal contrast enhancement is seen. The mediastinum is unremarkable in appearance. No mediastinal lymphadenopathy is seen. No pericardial effusion is identified. The great vessels are grossly unremarkable in appearance. The patient's tracheostomy tube is again noted. No axillary lymphadenopathy is seen. The visualized portions of the thyroid gland are unremarkable in appearance. The visualized portions of the liver and spleen are unremarkable. The visualized portions of the pancreas, stomach, adrenal glands and kidneys are within normal limits. No acute osseous abnormalities are seen. Review of the MIP images confirms the above findings. IMPRESSION: 1. No evidence of pulmonary embolus. 2. Mild bibasilar atelectasis noted.  Lungs otherwise clear. Electronically Signed   By: Roanna Raider M.D.   On: 03/19/2016 01:21    Procedures Procedures (including critical care time)  Medications Ordered in ED Medications  iopamidol (ISOVUE-370) 76 % injection (not administered)  albuterol (PROVENTIL HFA;VENTOLIN HFA) 108 (90 Base) MCG/ACT inhaler 2 puff (not administered)  AEROCHAMBER PLUS FLO-VU MEDIUM MISC 1 each (not administered)  benzonatate (TESSALON) capsule 100 mg (not administered)  iopamidol (ISOVUE-370) 76 % injection (100 mLs  Contrast Given  03/18/16 2331)  iopamidol (ISOVUE-370) 76 % injection 80 mL (80 mLs Intravenous Contrast Given 03/19/16 0048)     Initial Impression / Assessment and Plan / ED Course  I have reviewed the triage vital signs and the nursing notes.  Pertinent labs & imaging results that were available during my care of the patient were reviewed by me and considered in my medical decision making (see chart for details).  Clinical Course  Value Comment By Time  BP: 115/82 VSS.  Afebrile Dierdre Forth, PA-C 09/07 2013  EKG 12-Lead NSR Demontrae Gilbert, PA-C 09/07 2014  Creatinine: (!) 1.24 Elevated but baseline Dierdre Forth, PA-C 09/07 2200  Hemoglobin: (!) 11.0 Anemia at baseline. Dahlia Client Traver Meckes, PA-C 09/07 2200  Troponin i, poc: 0.00 Negative initial troponin. Dahlia Client Cabot Cromartie, PA-C 09/07 2200  Lactic Acid, Venous: 0.85 Normal lactic acid. Dahlia Client Jaciel Diem, PA-C 09/07 2200  DG Chest 2 View X-ray with questionable right lower lobe infiltrate. Unable to identify the site of her previous HCAP based on record review. Dierdre Forth, PA-C 09/07 2233   Pt discussed with Dr. Rolland Porter who agrees with the plan to add CT angio chest and d/c home if negative.  Highly doubt ACS at this time with multiple days of constant chest pain worse with coughing.   Dierdre Forth, PA-C 09/07 2300  CT ANGIO CHEST PE W OR WO CONTRAST (Reviewed) Rolland Porter, MD 09/07 2342  CT ANGIO CHEST PE W OR WO CONTRAST (Reviewed) Rolland Porter, MD 09/07 2342  CT ANGIO CHEST PE W OR WO CONTRAST No PE or PNA Virgilene Stryker, PA-C 09/08 0206   Pt with constant chest pain and worsening cough.  Pt was sent to ED by SNF for persistent PNA.  CXR with ? R infiltrate, but this is atelectasis on CT scan.  No PNA or infiltrate.  No PE. No recurrence of patient's HCAP.   Patient's chest pain is not likely ACS. She is afebrile with stable vital signs. Labs are reassuring. Pt given albuterol MDI for fine wheezes. She is to  follow-up with her primary care physician in 2 days. She is to return  to the emergency department for worsening symptoms.  She will be given tessalon for her cough.   Final Clinical Impressions(s) / ED Diagnoses   Final diagnoses:  Cough    New Prescriptions New Prescriptions   BENZONATATE (TESSALON) 100 MG CAPSULE    Take 1 capsule (100 mg total) by mouth every 8 (eight) hours.     [facility]ia Client Arnett Duddy, PA-C 03/19/16 0216    Rolland Porter, MD 03/28/16 [phone]

## 2016-03-18 NOTE — Progress Notes (Signed)
Patient ID: Yesenia [name], female   DOB: 02/21/1961, 55 y.o.   MRN: [id number]    HISTORY AND PHYSICAL   DATE: 03/18/2016  Location:    [facility] Room Number: 116 A Place of Service: SNF (31)   Extended Emergency Contact Information Primary Emergency Contact: Howell,Catrina  [place] Phone: [phone] Relation: Daughter  Advanced Directive information FULL CODE; MOST FORM ON CHART  Chief Complaint  Patient presents with  . Readmit To SNF    HPI:  55 yo female seen today as a readmission into SNF following hospital stay for acute hypoxic respiratory failure due to early tracheobronchitis, CKD3, hypokalemia, morbid obesity, OSA/obesity hypoventilation syndrome, GERD, DMII, depression. She had trach placed by ENT Dr Redmond Baseman due to sleep apnea approx 3 weeks prior to admission. respiratory cx  (+) enterobacter. CXR neg for acute process. She was d/cd on levaquin. ENT followed. Electrolytes repleted. BMI 51.74 kg/m2. Glipizide started for elevated BS. Hgb 9.4; Cr 1.11; albumin 2.9; K 3.9 at d/c. She presents to SNF for short term rehab.  She c/o abdominal cramping prior to BMs. She denies straining. No BRBPR/melena. No nursing issues. No falls. She reports no concerns about trach today. She is a poor historian due to anxiety. Hx obtained from chart  Hx anemia - stable. Hgb 9.4 at d/c. She takes iron  OSA - s/p trach. She did tolerate CPAP. She has morbid obesity which contributes to OSA. She takes albuterol HFA  HTN - BP stable on HCTZ  DM - previously diet controlled. A1c 6.1%.currently on glipizide. CBG 117. No low BS reactions  Chronic pain syndrome/FMS - stable on norco, lyrica, gabapentin  Insomnia/anxiety/depression - stable on trazodone, lexapro, klonopin and buspar  GERD/constipation - stable on protonix, zofran prn and bowel regimen.  CKD - stable. Cr 1.1   Past Medical History:  Diagnosis Date  . Anemia   . Anemia   . Chronic pain    . CKD (chronic kidney disease)   . Diabetes mellitus without complication (HCC)    diet controlled. does not check CBG's  . Fibromyalgia    hospitilized 12/16 due to inability to walk  . Hyperlipemia   . Hypertension   . Insomnia   . Neuropathy ([place])   . Right sided weakness   . Stroke [facility]) 2014?   no residual weakness   . Vitamin deficiency    Vit D    Past Surgical History:  Procedure Laterality Date  . [place]  . LEG SURGERY Right 2007   Surgery x3 2 arthroscopies and one rod  . OOPHORECTOMY Right 1993?  . TRACHEOSTOMY TUBE PLACEMENT N/A 02/13/2016   Procedure: TRACHEOSTOMY;  Surgeon: Melida Quitter, MD;  Location: [facility];  Service: ENT;  Laterality: N/A;    Patient Care Team: Everardo Beals, NP as PCP - General Sofie Rower, PA-C as Physician Assistant (Physician Assistant)  Social History   Social History  . Marital status: Single    Spouse name: N/A  . Number of children: 2  . Years of education: 12th    Occupational History  . [employer]    Social History Main Topics  . Smoking status: Never Smoker  . Smokeless tobacco: Never Used  . Alcohol use No  . Drug use: No  . Sexual activity: No   Other Topics Concern  . Not on file   Social History Narrative   Reports no caffeine use      reports that  she has never smoked. She has never used smokeless tobacco. She reports that she does not drink alcohol or use drugs.  Family History  Problem Relation Age of Onset  . Breast cancer Mother   . Leukemia Brother   . Breast cancer Maternal Grandmother    Family Status  Relation Status  . Mother Deceased  . Brother   . Maternal Grandmother     Immunization History  Administered Date(s) Administered  . Influenza,inj,Quad PF,36+ Mos 04/12/2013  . Pneumococcal Polysaccharide-23 04/12/2013    Allergies  Allergen Reactions  . Buprenorphine Hcl Hives  . Morphine And Related Hives and Dermatitis    Medications: Patient's  Medications  New Prescriptions   No medications on file  Previous Medications   ALBUTEROL (PROVENTIL HFA;VENTOLIN HFA) 108 (90 BASE) MCG/ACT INHALER    Inhale 2 puffs into the lungs every 6 (six) hours as needed for wheezing.   BACITRACIN OINTMENT    Apply 1 application topically 2 (two) times daily. To trach site   BENZONATATE (TESSALON) 100 MG CAPSULE    Take 1 capsule (100 mg total) by mouth 3 (three) times daily as needed for cough.   BISACODYL (DULCOLAX) 5 MG EC TABLET    Take 1 tablet (5 mg total) by mouth daily as needed for moderate constipation.   BUSPIRONE (BUSPAR) 15 MG TABLET    Take 15 mg by mouth 2 (two) times daily.   CLONAZEPAM (KLONOPIN) 0.5 MG TABLET    Take 0.5 mg by mouth at bedtime.   DEXTROMETHORPHAN-GUAIFENESIN (MUCINEX DM) 30-600 MG 12HR TABLET    Take 1 tablet by mouth 2 (two) times daily.   ESCITALOPRAM (LEXAPRO) 10 MG TABLET    Take 5 mg by mouth daily. Take 5 mg daily x 5 days then take 10 mg daily   FERROUS SULFATE 325 (65 FE) MG TABLET    Take 325 mg by mouth daily.   GABAPENTIN (NEURONTIN) 300 MG CAPSULE    Take 300 mg by mouth daily as needed. For pain   GLIPIZIDE (GLUCOTROL XL) 2.5 MG 24 HR TABLET    Take 1 tablet (2.5 mg total) by mouth daily with breakfast.   HYDROCHLOROTHIAZIDE (HYDRODIURIL) 25 MG TABLET    Take 0.5 tablets (12.5 mg total) by mouth daily.   HYDROCODONE-ACETAMINOPHEN (NORCO/VICODIN) 5-325 MG TABLET    Take 1-2 tablets by mouth every 4 (four) hours as needed for moderate pain or severe pain. DO NOT EXCEED 3GM OF APAP IN 24 HOURS FROM ALL SOURCES   ONDANSETRON (ZOFRAN) 4 MG TABLET    Take 4 mg by mouth daily. Patient states she is taking every day per patient   PANTOPRAZOLE (PROTONIX) 40 MG TABLET    Take 1 tablet (40 mg total) by mouth daily.   POLYETHYLENE GLYCOL (MIRALAX / GLYCOLAX) PACKET    Take 17 g by mouth daily as needed for mild constipation.   PREGABALIN (LYRICA) 50 MG CAPSULE    Take 1 capsule (50 mg total) by mouth daily.    SACCHAROMYCES BOULARDII (FLORASTOR) 250 MG CAPSULE    Take 250 mg by mouth 2 (two) times daily.   TRAZODONE (DESYREL) 150 MG TABLET    Take 150 mg by mouth at bedtime.  Modified Medications   No medications on file  Discontinued Medications   LEVOFLOXACIN (LEVAQUIN) 500 MG TABLET    Take 1 tablet (500 mg total) by mouth daily.   TRAZODONE (DESYREL) 150 MG TABLET    Take 2 tablets (300 mg total) by  mouth at bedtime.    Review of Systems  Unable to perform ROS: Other (increased anxiety)    Vitals:   03/18/16 1305  BP: (!) 154/80  Pulse: 68  Resp: 20  Temp: 97.2 F (36.2 C)  TempSrc: Oral  SpO2: 97%  Weight: (!) 319 lb 9.6 oz (145 kg)  Height: '5\' 7"'  (1.702 m)   Body mass index is 50.06 kg/m.  Physical Exam  Constitutional: She appears well-developed and well-nourished.  Lying in bed in NAD, trach intact  HENT:  Mouth/Throat: Oropharynx is clear and moist. No oropharyngeal exudate.  Eyes: Pupils are equal, round, and reactive to light. No scleral icterus.  Neck: Neck supple. Carotid bruit is not present. No tracheal deviation present.    Cardiovascular: Regular rhythm and intact distal pulses.  Tachycardia present.  Exam reveals no gallop and no friction rub.   Murmur heard.  Systolic murmur is present with a grade of 1/6  Trace LE edema b/l. No calf TTP  Pulmonary/Chest: Effort normal and breath sounds normal. No stridor. No respiratory distress. She has no wheezes. She has no rales.  Abdominal: Soft. Bowel sounds are normal. She exhibits no distension and no mass. There is no hepatomegaly. There is no tenderness. There is no rebound and no guarding.  obese  Musculoskeletal: She exhibits edema.  Lymphadenopathy:    She has no cervical adenopathy.  Neurological: She is alert.  Skin: Skin is warm and dry. No rash noted.  Psychiatric: Her behavior is normal. Judgment and thought content normal. Her mood appears anxious.     Labs reviewed: Admission on 03/06/2016,  Discharged on 03/11/2016  Component Date Value Ref Range Status  . Sodium 03/06/2016 140  135 - 145 mmol/L Final  . Potassium 03/06/2016 3.1* 3.5 - 5.1 mmol/L Final  . Chloride 03/06/2016 105  101 - 111 mmol/L Final  . CO2 03/06/2016 26  22 - 32 mmol/L Final  . Glucose, Bld 03/06/2016 110* 65 - 99 mg/dL Final  . BUN 03/06/2016 16  6 - 20 mg/dL Final  . Creatinine, Ser 03/06/2016 1.26* 0.44 - 1.00 mg/dL Final  . Calcium 03/06/2016 9.5  8.9 - 10.3 mg/dL Final  . GFR calc non Af Amer 03/06/2016 47* >60 mL/min Final  . GFR calc Af Amer 03/06/2016 55* >60 mL/min Final   Comment: (NOTE) The eGFR has been calculated using the CKD EPI equation. This calculation has not been validated in all clinical situations. eGFR's persistently <60 mL/min signify possible Chronic Kidney Disease.   . Anion gap 03/06/2016 9  5 - 15 Final  . WBC 03/06/2016 7.1  4.0 - 10.5 K/uL Final  . RBC 03/06/2016 4.18  3.87 - 5.11 MIL/uL Final  . Hemoglobin 03/06/2016 11.8* 12.0 - 15.0 g/dL Final  . HCT 03/06/2016 36.8  36.0 - 46.0 % Final  . MCV 03/06/2016 88.0  78.0 - 100.0 fL Final  . MCH 03/06/2016 28.2  26.0 - 34.0 pg Final  . MCHC 03/06/2016 32.1  30.0 - 36.0 g/dL Final  . RDW 03/06/2016 14.1  11.5 - 15.5 % Final  . Platelets 03/06/2016 396  150 - 400 K/uL Final  . Troponin i, poc 03/06/2016 0.01  0.00 - 0.08 ng/mL Final  . Comment 3 03/06/2016          Final   Comment: Due to the release kinetics of cTnI, a negative result within the first hours of the onset of symptoms does not rule out myocardial infarction with certainty. If myocardial  infarction is still suspected, repeat the test at appropriate intervals.   Marland Kitchen D-Dimer, Quant 03/06/2016 0.36  0.00 - 0.50 ug/mL-FEU Final   Comment: (NOTE) At the manufacturer cut-off of 0.50 ug/mL FEU, this assay has been documented to exclude PE with a sensitivity and negative predictive value of 97 to 99%.  At this time, this assay has not been approved by the FDA to  exclude DVT/VTE. Results should be correlated with clinical presentation.   Marland Kitchen Specimen Description 03/11/2016 BLOOD RIGHT ANTECUBITAL   Final  . Special Requests 03/11/2016 BOTTLES DRAWN AEROBIC AND ANAEROBIC 5CC   Final  . Culture 03/11/2016 NO GROWTH 5 DAYS   Final  . Report Status 03/11/2016 03/11/2016 FINAL   Final  . Specimen Description 03/11/2016 BLOOD LEFT ANTECUBITAL   Final  . Special Requests 03/11/2016 BOTTLES DRAWN AEROBIC AND ANAEROBIC 5CC   Final  . Culture 03/11/2016 NO GROWTH 5 DAYS   Final  . Report Status 03/11/2016 03/11/2016 FINAL   Final  . Lactic Acid, Venous 03/06/2016 3.01* 0.5 - 1.9 mmol/L Final  . Comment 03/06/2016 NOTIFIED PHYSICIAN   Final  . pH, Ven 03/06/2016 7.541* 7.250 - 7.300 Final  . pCO2, Ven 03/06/2016 34.0* 45.0 - 50.0 mmHg Final  . pO2, Ven 03/06/2016 30.0* 31.0 - 45.0 mmHg Final  . Bicarbonate 03/06/2016 29.2* 20.0 - 24.0 mEq/L Final  . TCO2 03/06/2016 30  0 - 100 mmol/L Final  . O2 Saturation 03/06/2016 66.0  % Final  . Acid-Base Excess 03/06/2016 7.0* 0.0 - 2.0 mmol/L Final  . Patient temperature 03/06/2016 HIDE   Final  . Sample type 03/06/2016 VENOUS   Final  . Comment 03/06/2016 NOTIFIED PHYSICIAN   Final  . Lactic Acid, Venous 03/06/2016 2.18* 0.5 - 1.9 mmol/L Final  . Comment 03/06/2016 NOTIFIED PHYSICIAN   Final  . Specimen Description 03/10/2016 URINE, RANDOM   Final  . Special Requests 03/10/2016 NONE   Final  . Culture 03/10/2016 40,000 COLONIES/mL ENTEROCOCCUS SPECIES*  Final  . Report Status 03/10/2016 03/10/2016 FINAL   Final  . Organism ID, Bacteria 03/10/2016 ENTEROCOCCUS SPECIES*  Final  . L. pneumophila Serogp 1 Ur Ag 03/08/2016 Negative  Negative Final   Comment: (NOTE) Presumptive negative for L. pneumophila serogroup 1 antigen in urine, suggesting no recent or current infection. Legionnaires' disease cannot be ruled out since other serogroups and species may also cause disease. Performed At: [facility] [place], [place] [id number] Lindon Romp MD YO:[id number]   . Source of Sample 03/08/2016 URINE, RANDOM   Final  . Strep Pneumo Urinary Antigen 03/07/2016 NEGATIVE  NEGATIVE Final   Comment:        Infection due to S. pneumoniae cannot be absolutely ruled out since the antigen present may be below the detection limit of the test.   . Specimen Description 03/09/2016 TRACHEAL ASPIRATE   Final  . Special Requests 03/09/2016 NONE   Final  . Gram Stain 03/09/2016    Final                   Value:FEW WBC PRESENT, PREDOMINANTLY PMN RARE GRAM POSITIVE RODS   . Culture 03/09/2016 FEW ENTEROBACTER AEROGENES   Final  . Report Status 03/09/2016 03/09/2016 FINAL   Final  . Organism ID, Bacteria 03/09/2016 ENTEROBACTER AEROGENES   Final  . Sodium 03/07/2016 141  135 - 145 mmol/L Final  . Potassium 03/07/2016 3.4* 3.5 - 5.1 mmol/L Final  . Chloride 03/07/2016 108  101 -  111 mmol/L Final  . CO2 03/07/2016 27  22 - 32 mmol/L Final  . Glucose, Bld 03/07/2016 127* 65 - 99 mg/dL Final  . BUN 03/07/2016 13  6 - 20 mg/dL Final  . Creatinine, Ser 03/07/2016 1.26* 0.44 - 1.00 mg/dL Final  . Calcium 03/07/2016 8.8* 8.9 - 10.3 mg/dL Final  . Total Protein 03/07/2016 6.5  6.5 - 8.1 g/dL Final  . Albumin 03/07/2016 2.9* 3.5 - 5.0 g/dL Final  . AST 03/07/2016 16  15 - 41 U/L Final  . ALT 03/07/2016 16  14 - 54 U/L Final  . Alkaline Phosphatase 03/07/2016 60  38 - 126 U/L Final  . Total Bilirubin 03/07/2016 0.6  0.3 - 1.2 mg/dL Final  . GFR calc non Af Amer 03/07/2016 47* >60 mL/min Final  . GFR calc Af Amer 03/07/2016 55* >60 mL/min Final   Comment: (NOTE) The eGFR has been calculated using the CKD EPI equation. This calculation has not been validated in all clinical situations. eGFR's persistently <60 mL/min signify possible Chronic Kidney Disease.   . Anion gap 03/07/2016 6  5 - 15 Final  . WBC 03/07/2016 6.4  4.0 - 10.5 K/uL Final  . RBC 03/07/2016 3.72* 3.87 - 5.11 MIL/uL Final  .  Hemoglobin 03/07/2016 10.3* 12.0 - 15.0 g/dL Final  . HCT 03/07/2016 32.9* 36.0 - 46.0 % Final  . MCV 03/07/2016 88.4  78.0 - 100.0 fL Final  . MCH 03/07/2016 27.7  26.0 - 34.0 pg Final  . MCHC 03/07/2016 31.3  30.0 - 36.0 g/dL Final  . RDW 03/07/2016 14.3  11.5 - 15.5 % Final  . Platelets 03/07/2016 311  150 - 400 K/uL Final  . Prothrombin Time 03/07/2016 15.8* 11.4 - 15.2 seconds Final  . INR 03/07/2016 1.26   Final  . Glucose-Capillary 03/07/2016 143* 65 - 99 mg/dL Final  . Glucose-Capillary 03/07/2016 140* 65 - 99 mg/dL Final  . Glucose-Capillary 03/07/2016 153* 65 - 99 mg/dL Final  . Comment 1 03/07/2016 Notify RN   Final  . Comment 2 03/07/2016 Document in Chart   Final  . Sodium 03/08/2016 143  135 - 145 mmol/L Final  . Potassium 03/08/2016 3.8  3.5 - 5.1 mmol/L Final  . Chloride 03/08/2016 110  101 - 111 mmol/L Final  . CO2 03/08/2016 26  22 - 32 mmol/L Final  . Glucose, Bld 03/08/2016 122* 65 - 99 mg/dL Final  . BUN 03/08/2016 13  6 - 20 mg/dL Final  . Creatinine, Ser 03/08/2016 1.28* 0.44 - 1.00 mg/dL Final  . Calcium 03/08/2016 9.0  8.9 - 10.3 mg/dL Final  . GFR calc non Af Amer 03/08/2016 47* >60 mL/min Final  . GFR calc Af Amer 03/08/2016 54* >60 mL/min Final   Comment: (NOTE) The eGFR has been calculated using the CKD EPI equation. This calculation has not been validated in all clinical situations. eGFR's persistently <60 mL/min signify possible Chronic Kidney Disease.   . Anion gap 03/08/2016 7  5 - 15 Final  . Magnesium 03/08/2016 1.9  1.7 - 2.4 mg/dL Final  . Glucose-Capillary 03/07/2016 120* 65 - 99 mg/dL Final  . Glucose-Capillary 03/08/2016 117* 65 - 99 mg/dL Final  . Glucose-Capillary 03/08/2016 173* 65 - 99 mg/dL Final  . Glucose-Capillary 03/08/2016 88  65 - 99 mg/dL Final  . Glucose-Capillary 03/08/2016 138* 65 - 99 mg/dL Final  . Glucose-Capillary 03/09/2016 122* 65 - 99 mg/dL Final  . Glucose-Capillary 03/09/2016 117* 65 - 99 mg/dL Final  .  Glucose-Capillary 03/09/2016 98  65 - 99 mg/dL Final  . Glucose-Capillary 03/09/2016 142* 65 - 99 mg/dL Final  . Glucose-Capillary 03/10/2016 112* 65 - 99 mg/dL Final  . Glucose-Capillary 03/10/2016 135* 65 - 99 mg/dL Final  . Sodium 03/11/2016 141  135 - 145 mmol/L Final  . Potassium 03/11/2016 3.9  3.5 - 5.1 mmol/L Final  . Chloride 03/11/2016 110  101 - 111 mmol/L Final  . CO2 03/11/2016 26  22 - 32 mmol/L Final  . Glucose, Bld 03/11/2016 106* 65 - 99 mg/dL Final  . BUN 03/11/2016 13  6 - 20 mg/dL Final  . Creatinine, Ser 03/11/2016 1.11* 0.44 - 1.00 mg/dL Final  . Calcium 03/11/2016 8.9  8.9 - 10.3 mg/dL Final  . Total Protein 03/11/2016 6.0* 6.5 - 8.1 g/dL Final  . Albumin 03/11/2016 2.9* 3.5 - 5.0 g/dL Final  . AST 03/11/2016 24  15 - 41 U/L Final  . ALT 03/11/2016 23  14 - 54 U/L Final  . Alkaline Phosphatase 03/11/2016 53  38 - 126 U/L Final  . Total Bilirubin 03/11/2016 0.3  0.3 - 1.2 mg/dL Final  . GFR calc non Af Amer 03/11/2016 55* >60 mL/min Final  . GFR calc Af Amer 03/11/2016 >60  >60 mL/min Final   Comment: (NOTE) The eGFR has been calculated using the CKD EPI equation. This calculation has not been validated in all clinical situations. eGFR's persistently <60 mL/min signify possible Chronic Kidney Disease.   . Anion gap 03/11/2016 5  5 - 15 Final  . WBC 03/11/2016 7.4  4.0 - 10.5 K/uL Final  . RBC 03/11/2016 3.42* 3.87 - 5.11 MIL/uL Final  . Hemoglobin 03/11/2016 9.4* 12.0 - 15.0 g/dL Final  . HCT 03/11/2016 30.2* 36.0 - 46.0 % Final  . MCV 03/11/2016 88.3  78.0 - 100.0 fL Final  . MCH 03/11/2016 27.5  26.0 - 34.0 pg Final  . MCHC 03/11/2016 31.1  30.0 - 36.0 g/dL Final  . RDW 03/11/2016 14.7  11.5 - 15.5 % Final  . Platelets 03/11/2016 294  150 - 400 K/uL Final  . Neutrophils Relative % 03/11/2016 49  % Final  . Neutro Abs 03/11/2016 3.6  1.7 - 7.7 K/uL Final  . Lymphocytes Relative 03/11/2016 40  % Final  . Lymphs Abs 03/11/2016 2.9  0.7 - 4.0 K/uL Final  .  Monocytes Relative 03/11/2016 7  % Final  . Monocytes Absolute 03/11/2016 0.5  0.1 - 1.0 K/uL Final  . Eosinophils Relative 03/11/2016 4  % Final  . Eosinophils Absolute 03/11/2016 0.3  0.0 - 0.7 K/uL Final  . Basophils Relative 03/11/2016 0  % Final  . Basophils Absolute 03/11/2016 0.0  0.0 - 0.1 K/uL Final  . Glucose-Capillary 03/10/2016 105* 65 - 99 mg/dL Final  . Glucose-Capillary 03/10/2016 122* 65 - 99 mg/dL Final  . Glucose-Capillary 03/11/2016 100* 65 - 99 mg/dL Final  . Comment 1 03/11/2016 Document in Chart   Final  . Glucose-Capillary 03/11/2016 120* 65 - 99 mg/dL Final  . Glucose-Capillary 03/11/2016 105* 65 - 99 mg/dL Final  Admission on 02/21/2016, Discharged on 02/25/2016  Component Date Value Ref Range Status  . Sodium 02/21/2016 137  135 - 145 mmol/L Final  . Potassium 02/21/2016 4.4  3.5 - 5.1 mmol/L Final  . Chloride 02/21/2016 100* 101 - 111 mmol/L Final  . CO2 02/21/2016 29  22 - 32 mmol/L Final  . Glucose, Bld 02/21/2016 107* 65 - 99 mg/dL Final  . BUN 02/21/2016 9  6 - 20 mg/dL Final  . Creatinine, Ser 02/21/2016 1.13* 0.44 - 1.00 mg/dL Final  . Calcium 02/21/2016 8.9  8.9 - 10.3 mg/dL Final  . Total Protein 02/21/2016 7.2  6.5 - 8.1 g/dL Final  . Albumin 02/21/2016 3.0* 3.5 - 5.0 g/dL Final  . AST 02/21/2016 29  15 - 41 U/L Final  . ALT 02/21/2016 33  14 - 54 U/L Final  . Alkaline Phosphatase 02/21/2016 101  38 - 126 U/L Final  . Total Bilirubin 02/21/2016 1.0  0.3 - 1.2 mg/dL Final  . GFR calc non Af Amer 02/21/2016 54* >60 mL/min Final  . GFR calc Af Amer 02/21/2016 >60  >60 mL/min Final   Comment: (NOTE) The eGFR has been calculated using the CKD EPI equation. This calculation has not been validated in all clinical situations. eGFR's persistently <60 mL/min signify possible Chronic Kidney Disease.   . Anion gap 02/21/2016 8  5 - 15 Final  . WBC 02/21/2016 11.8* 4.0 - 10.5 K/uL Final  . RBC 02/21/2016 4.23  3.87 - 5.11 MIL/uL Final  . Hemoglobin  02/21/2016 12.1  12.0 - 15.0 g/dL Final  . HCT 02/21/2016 36.9  36.0 - 46.0 % Final  . MCV 02/21/2016 87.2  78.0 - 100.0 fL Final  . MCH 02/21/2016 28.6  26.0 - 34.0 pg Final  . MCHC 02/21/2016 32.8  30.0 - 36.0 g/dL Final  . RDW 02/21/2016 13.8  11.5 - 15.5 % Final  . Platelets 02/21/2016 404* 150 - 400 K/uL Final  . Neutrophils Relative % 02/21/2016 51  % Final  . Neutro Abs 02/21/2016 6.0  1.7 - 7.7 K/uL Final  . Lymphocytes Relative 02/21/2016 37  % Final  . Lymphs Abs 02/21/2016 4.4* 0.7 - 4.0 K/uL Final  . Monocytes Relative 02/21/2016 10  % Final  . Monocytes Absolute 02/21/2016 1.1* 0.1 - 1.0 K/uL Final  . Eosinophils Relative 02/21/2016 2  % Final  . Eosinophils Absolute 02/21/2016 0.3  0.0 - 0.7 K/uL Final  . Basophils Relative 02/21/2016 0  % Final  . Basophils Absolute 02/21/2016 0.0  0.0 - 0.1 K/uL Final  . Troponin i, poc 02/21/2016 0.01  0.00 - 0.08 ng/mL Final  . Comment 3 02/21/2016          Final   Comment: Due to the release kinetics of cTnI, a negative result within the first hours of the onset of symptoms does not rule out myocardial infarction with certainty. If myocardial infarction is still suspected, repeat the test at appropriate intervals.   . Lactic Acid, Venous 02/21/2016 1.13  0.5 - 1.9 mmol/L Final  . Specimen Description 02/26/2016 BLOOD LEFT ANTECUBITAL   Final  . Special Requests 02/26/2016 BOTTLES DRAWN AEROBIC AND ANAEROBIC 5CC   Final  . Culture 02/26/2016 NO GROWTH 5 DAYS   Final  . Report Status 02/26/2016 02/26/2016 FINAL   Final  . Specimen Description 02/26/2016 BLOOD LEFT HAND   Final  . Special Requests 02/26/2016 BOTTLES DRAWN AEROBIC AND ANAEROBIC 5CC   Final  . Culture 02/26/2016 NO GROWTH 5 DAYS   Final  . Report Status 02/26/2016 02/26/2016 FINAL   Final  . HIV Screen 4th Generation wRfx 02/22/2016 Non Reactive  Non Reactive Final   Comment: (NOTE) Performed At: [facility] [place], [place]  [id number] Lindon Romp MD EH:[id number]   . Sodium 02/22/2016 139  135 - 145 mmol/L Final  . Potassium 02/22/2016 3.6  3.5 - 5.1 mmol/L Final  .  Chloride 02/22/2016 97* 101 - 111 mmol/L Final  . CO2 02/22/2016 30  22 - 32 mmol/L Final  . Glucose, Bld 02/22/2016 138* 65 - 99 mg/dL Final  . BUN 02/22/2016 11  6 - 20 mg/dL Final  . Creatinine, Ser 02/22/2016 1.54* 0.44 - 1.00 mg/dL Final  . Calcium 02/22/2016 9.1  8.9 - 10.3 mg/dL Final  . GFR calc non Af Amer 02/22/2016 37* >60 mL/min Final  . GFR calc Af Amer 02/22/2016 43* >60 mL/min Final   Comment: (NOTE) The eGFR has been calculated using the CKD EPI equation. This calculation has not been validated in all clinical situations. eGFR's persistently <60 mL/min signify possible Chronic Kidney Disease.   . Anion gap 02/22/2016 12  5 - 15 Final  . Glucose-Capillary 02/22/2016 124* 65 - 99 mg/dL Final  . Hemoglobin 02/22/2016 10.4* 12.0 - 15.0 g/dL Final  . HCT 02/22/2016 33.3* 36.0 - 46.0 % Final  . Hemoglobin 02/22/2016 10.4* 12.0 - 15.0 g/dL Final  . HCT 02/22/2016 32.4* 36.0 - 46.0 % Final  . Hemoglobin 02/22/2016 10.6* 12.0 - 15.0 g/dL Final  . HCT 02/22/2016 33.5* 36.0 - 46.0 % Final  . Hemoglobin 02/22/2016 10.3* 12.0 - 15.0 g/dL Final  . HCT 02/22/2016 32.1* 36.0 - 46.0 % Final  . ABO/RH(D) 02/22/2016 O POS   Final  . Antibody Screen 02/22/2016 NEG   Final  . Sample Expiration 02/22/2016 02/25/2016   Final  . ABO/RH(D) 02/22/2016 O POS   Final  . Troponin I 02/22/2016 0.03* <0.03 ng/mL Final   Comment: CRITICAL RESULT CALLED TO, READ BACK BY AND VERIFIED WITH: RN I HABIB AT [id number] MARTINB   . WBC 02/23/2016 9.6  4.0 - 10.5 K/uL Final  . RBC 02/23/2016 3.61* 3.87 - 5.11 MIL/uL Final  . Hemoglobin 02/23/2016 9.8* 12.0 - 15.0 g/dL Final  . HCT 02/23/2016 31.8* 36.0 - 46.0 % Final  . MCV 02/23/2016 88.1  78.0 - 100.0 fL Final  . MCH 02/23/2016 27.1  26.0 - 34.0 pg Final  . MCHC 02/23/2016 30.8  30.0 - 36.0 g/dL  Final  . RDW 02/23/2016 14.1  11.5 - 15.5 % Final  . Platelets 02/23/2016 350  150 - 400 K/uL Final  . Sodium 02/23/2016 139  135 - 145 mmol/L Final  . Potassium 02/23/2016 3.4* 3.5 - 5.1 mmol/L Final  . Chloride 02/23/2016 101  101 - 111 mmol/L Final  . CO2 02/23/2016 29  22 - 32 mmol/L Final  . Glucose, Bld 02/23/2016 115* 65 - 99 mg/dL Final  . BUN 02/23/2016 16  6 - 20 mg/dL Final  . Creatinine, Ser 02/23/2016 1.53* 0.44 - 1.00 mg/dL Final  . Calcium 02/23/2016 8.8* 8.9 - 10.3 mg/dL Final  . GFR calc non Af Amer 02/23/2016 37* >60 mL/min Final  . GFR calc Af Amer 02/23/2016 43* >60 mL/min Final   Comment: (NOTE) The eGFR has been calculated using the CKD EPI equation. This calculation has not been validated in all clinical situations. eGFR's persistently <60 mL/min signify possible Chronic Kidney Disease.   . Anion gap 02/23/2016 9  5 - 15 Final  . WBC 02/24/2016 7.7  4.0 - 10.5 K/uL Final  . RBC 02/24/2016 3.69* 3.87 - 5.11 MIL/uL Final  . Hemoglobin 02/24/2016 10.2* 12.0 - 15.0 g/dL Final  . HCT 02/24/2016 32.8* 36.0 - 46.0 % Final  . MCV 02/24/2016 88.9  78.0 - 100.0 fL Final  . MCH 02/24/2016 27.6  26.0 - 34.0 pg  Final  . MCHC 02/24/2016 31.1  30.0 - 36.0 g/dL Final  . RDW 02/24/2016 14.1  11.5 - 15.5 % Final  . Platelets 02/24/2016 350  150 - 400 K/uL Final  . Sodium 02/24/2016 140  135 - 145 mmol/L Final  . Potassium 02/24/2016 3.5  3.5 - 5.1 mmol/L Final  . Chloride 02/24/2016 104  101 - 111 mmol/L Final  . CO2 02/24/2016 27  22 - 32 mmol/L Final  . Glucose, Bld 02/24/2016 105* 65 - 99 mg/dL Final  . BUN 02/24/2016 11  6 - 20 mg/dL Final  . Creatinine, Ser 02/24/2016 1.17* 0.44 - 1.00 mg/dL Final  . Calcium 02/24/2016 8.8* 8.9 - 10.3 mg/dL Final  . GFR calc non Af Amer 02/24/2016 52* >60 mL/min Final  . GFR calc Af Amer 02/24/2016 >60  >60 mL/min Final   Comment: (NOTE) The eGFR has been calculated using the CKD EPI equation. This calculation has not been validated  in all clinical situations. eGFR's persistently <60 mL/min signify possible Chronic Kidney Disease.   . Anion gap 02/24/2016 9  5 - 15 Final  . Sodium 02/25/2016 141  135 - 145 mmol/L Final  . Potassium 02/25/2016 3.6  3.5 - 5.1 mmol/L Final  . Chloride 02/25/2016 102  101 - 111 mmol/L Final  . CO2 02/25/2016 30  22 - 32 mmol/L Final  . Glucose, Bld 02/25/2016 105* 65 - 99 mg/dL Final  . BUN 02/25/2016 11  6 - 20 mg/dL Final  . Creatinine, Ser 02/25/2016 1.11* 0.44 - 1.00 mg/dL Final  . Calcium 02/25/2016 9.0  8.9 - 10.3 mg/dL Final  . GFR calc non Af Amer 02/25/2016 55* >60 mL/min Final  . GFR calc Af Amer 02/25/2016 >60  >60 mL/min Final   Comment: (NOTE) The eGFR has been calculated using the CKD EPI equation. This calculation has not been validated in all clinical situations. eGFR's persistently <60 mL/min signify possible Chronic Kidney Disease.   . Anion gap 02/25/2016 9  5 - 15 Final  Admission on 02/13/2016, Discharged on 02/20/2016  Component Date Value Ref Range Status  . Glucose-Capillary 02/13/2016 140* 65 - 99 mg/dL Final  . Glucose-Capillary 02/13/2016 133* 65 - 99 mg/dL Final  . Comment 1 02/13/2016 Notify RN   Final  . Comment 2 02/13/2016 Document in Chart   Final  . MRSA by PCR 02/13/2016 NEGATIVE  NEGATIVE Final   Comment:        The GeneXpert MRSA Assay (FDA approved for NASAL specimens only), is one component of a comprehensive MRSA colonization surveillance program. It is not intended to diagnose MRSA infection nor to guide or monitor treatment for MRSA infections.   . Color, Urine 02/16/2016 YELLOW  YELLOW Final  . APPearance 02/16/2016 HAZY* CLEAR Final  . Specific Gravity, Urine 02/16/2016 1.015  1.005 - 1.030 Final  . pH 02/16/2016 5.5  5.0 - 8.0 Final  . Glucose, UA 02/16/2016 NEGATIVE  NEGATIVE mg/dL Final  . Hgb urine dipstick 02/16/2016 NEGATIVE  NEGATIVE Final  . Bilirubin Urine 02/16/2016 NEGATIVE  NEGATIVE Final  . Ketones, ur 02/16/2016  NEGATIVE  NEGATIVE mg/dL Final  . Protein, ur 02/16/2016 NEGATIVE  NEGATIVE mg/dL Final  . Nitrite 02/16/2016 NEGATIVE  NEGATIVE Final  . Leukocytes, UA 02/16/2016 NEGATIVE  NEGATIVE Final  . Amylase 02/16/2016 40  28 - 100 U/L Final  . Lipase 02/16/2016 19  11 - 51 U/L Final  . Sodium 02/16/2016 140  135 - 145 mmol/L Final  .  Potassium 02/16/2016 3.8  3.5 - 5.1 mmol/L Final  . Chloride 02/16/2016 105  101 - 111 mmol/L Final  . CO2 02/16/2016 27  22 - 32 mmol/L Final  . Glucose, Bld 02/16/2016 136* 65 - 99 mg/dL Final  . BUN 02/16/2016 8  6 - 20 mg/dL Final  . Creatinine, Ser 02/16/2016 1.03* 0.44 - 1.00 mg/dL Final  . Calcium 02/16/2016 9.0  8.9 - 10.3 mg/dL Final  . Total Protein 02/16/2016 6.6  6.5 - 8.1 g/dL Final  . Albumin 02/16/2016 3.0* 3.5 - 5.0 g/dL Final  . AST 02/16/2016 27  15 - 41 U/L Final  . ALT 02/16/2016 31  14 - 54 U/L Final  . Alkaline Phosphatase 02/16/2016 83  38 - 126 U/L Final  . Total Bilirubin 02/16/2016 0.7  0.3 - 1.2 mg/dL Final  . GFR calc non Af Amer 02/16/2016 >60  >60 mL/min Final  . GFR calc Af Amer 02/16/2016 >60  >60 mL/min Final   Comment: (NOTE) The eGFR has been calculated using the CKD EPI equation. This calculation has not been validated in all clinical situations. eGFR's persistently <60 mL/min signify possible Chronic Kidney Disease.   . Anion gap 02/16/2016 8  5 - 15 Final  . WBC 02/16/2016 14.5* 4.0 - 10.5 K/uL Final  . RBC 02/16/2016 3.85* 3.87 - 5.11 MIL/uL Final  . Hemoglobin 02/16/2016 10.8* 12.0 - 15.0 g/dL Final  . HCT 02/16/2016 34.5* 36.0 - 46.0 % Final  . MCV 02/16/2016 89.6  78.0 - 100.0 fL Final  . MCH 02/16/2016 28.1  26.0 - 34.0 pg Final  . MCHC 02/16/2016 31.3  30.0 - 36.0 g/dL Final  . RDW 02/16/2016 14.9  11.5 - 15.5 % Final  . Platelets 02/16/2016 309  150 - 400 K/uL Final  . Glucose-Capillary 02/17/2016 132* 65 - 99 mg/dL Final  . Comment 1 02/17/2016 Notify RN   Final  . Glucose-Capillary 02/18/2016 133* 65 - 99  mg/dL Final  . Comment 1 02/18/2016 Notify RN   Final  . Glucose-Capillary 02/18/2016 120* 65 - 99 mg/dL Final  . Glucose-Capillary 02/18/2016 131* 65 - 99 mg/dL Final  . Comment 1 02/18/2016 Notify RN   Final  . WBC 02/19/2016 9.9  4.0 - 10.5 K/uL Final  . RBC 02/19/2016 3.54* 3.87 - 5.11 MIL/uL Final  . Hemoglobin 02/19/2016 9.7* 12.0 - 15.0 g/dL Final  . HCT 02/19/2016 31.7* 36.0 - 46.0 % Final  . MCV 02/19/2016 89.5  78.0 - 100.0 fL Final  . MCH 02/19/2016 27.4  26.0 - 34.0 pg Final  . MCHC 02/19/2016 30.6  30.0 - 36.0 g/dL Final  . RDW 02/19/2016 14.3  11.5 - 15.5 % Final  . Platelets 02/19/2016 331  150 - 400 K/uL Final  . Glucose-Capillary 02/18/2016 129* 65 - 99 mg/dL Final  . Glucose-Capillary 02/18/2016 97  65 - 99 mg/dL Final  . Glucose-Capillary 02/19/2016 142* 65 - 99 mg/dL Final  . Glucose-Capillary 02/19/2016 112* 65 - 99 mg/dL Final  . Comment 1 02/19/2016 Notify RN   Final  . Glucose-Capillary 02/19/2016 102* 65 - 99 mg/dL Final  . Comment 1 02/19/2016 Notify RN   Final  . Glucose-Capillary 02/19/2016 104* 65 - 99 mg/dL Final  . Glucose-Capillary 02/20/2016 131* 65 - 99 mg/dL Final  . Glucose-Capillary 02/20/2016 128* 65 - 99 mg/dL Final  Hospital Outpatient Visit on 02/05/2016  Component Date Value Ref Range Status  . Glucose-Capillary 02/05/2016 104* 65 - 99 mg/dL Final  .  Sodium 02/05/2016 140  135 - 145 mmol/L Final  . Potassium 02/05/2016 3.8  3.5 - 5.1 mmol/L Final  . Chloride 02/05/2016 111  101 - 111 mmol/L Final  . CO2 02/05/2016 21* 22 - 32 mmol/L Final  . Glucose, Bld 02/05/2016 108* 65 - 99 mg/dL Final  . BUN 02/05/2016 14  6 - 20 mg/dL Final  . Creatinine, Ser 02/05/2016 1.17* 0.44 - 1.00 mg/dL Final  . Calcium 02/05/2016 9.4  8.9 - 10.3 mg/dL Final  . GFR calc non Af Amer 02/05/2016 52* >60 mL/min Final  . GFR calc Af Amer 02/05/2016 >60  >60 mL/min Final   Comment: (NOTE) The eGFR has been calculated using the CKD EPI equation. This calculation  has not been validated in all clinical situations. eGFR's persistently <60 mL/min signify possible Chronic Kidney Disease.   . Anion gap 02/05/2016 8  5 - 15 Final  . WBC 02/05/2016 7.9  4.0 - 10.5 K/uL Final  . RBC 02/05/2016 4.52  3.87 - 5.11 MIL/uL Final  . Hemoglobin 02/05/2016 12.8  12.0 - 15.0 g/dL Final  . HCT 02/05/2016 38.9  36.0 - 46.0 % Final  . MCV 02/05/2016 86.1  78.0 - 100.0 fL Final  . MCH 02/05/2016 28.3  26.0 - 34.0 pg Final  . MCHC 02/05/2016 32.9  30.0 - 36.0 g/dL Final  . RDW 02/05/2016 14.4  11.5 - 15.5 % Final  . Platelets 02/05/2016 321  150 - 400 K/uL Final  . Hgb A1c MFr Bld 02/06/2016 6.1* 4.8 - 5.6 % Final   Comment: (NOTE)         Pre-diabetes: 5.7 - 6.4         Diabetes: >6.4         Glycemic control for adults with diabetes: <7.0   . Mean Plasma Glucose 02/06/2016 128  mg/dL Final   Comment: (NOTE) Performed At: [facility] [place], [place] [id number] Lindon Romp MD XH:[id number]     Dg Chest 2 View  Result Date: 03/07/2016 CLINICAL DATA:  Pneumonia EXAM: CHEST  2 VIEW COMPARISON:  03/06/2016 FINDINGS: Tracheostomy to unchanged. Normal cardiac silhouette. Low lung volumes and mild basilar atelectasis. Lungs are clear. IMPRESSION: No acute cardiopulmonary findings. Electronically Signed   By: Suzy Bouchard M.D.   On: 03/07/2016 08:18   Ct Soft Tissue Neck W Contrast  Result Date: 02/22/2016 CLINICAL DATA:  Tracheostomy. Hemoptysis. Tracheostomy placed 1 week ago. Fevers and chills. EXAM: CT NECK WITH CONTRAST TECHNIQUE: Multidetector CT imaging of the neck was performed using the standard protocol following the bolus administration of intravenous contrast. CONTRAST:  54m ISOVUE-300 IOPAMIDOL (ISOVUE-300) INJECTION 61% COMPARISON:  11/26/2015 FINDINGS: Pharynx and larynx: No mucosal or submucosal lesion visible. Possible vocal cord paresis on the right. Tracheostomy appears well positioned without evidence of placement  site complication. Tracheostomy does not have extensive purchase into the trachea and 1 might consider a longer tube in the future. Salivary glands: Parotid glands are normal. Left submandibular gland is normal. The patient has developed enlargement and indistinctness of the submandibular gland on the right with areas of internal low density and a few microcalcifications. 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 This gland appeared normal 3 months ago. Thyroid: Normal Lymph nodes: No enlarged or low-density nodes identified. Vascular: Negative Limited intracranial: Negative Visualized orbits: Normal Mastoids and visualized paranasal sinuses: Clear Skeleton: No significant finding IMPRESSION: Development of enlargement, indistinctness and low-density affecting the submandibular gland on the  right consistent with sialoadenitis. Tracheostomy in place. Note that the tracheostomy does have its tip within the trachea, but only by a cm or so. Consider use of a a longer to in the future. Electronically Signed   By: Nelson Chimes M.D.   On: 02/22/2016 20:08   Ct Chest W Contrast  Result Date: 02/22/2016 CLINICAL DATA:  55 year old female with hemoptysis. Tracheostomy placement 4 days ago. Fever, throat pain, shortness of breath and chest pain. Initial encounter. EXAM: CT CHEST WITH CONTRAST TECHNIQUE: Multidetector CT imaging of the chest was performed during intravenous contrast administration. CONTRAST:  3m ISOVUE-300 IOPAMIDOL (ISOVUE-300) INJECTION 61% COMPARISON:  Neck CT from today reported separately. Portable chest radiographs 02/21/2016 and earlier. CT Abdomen and Pelvis 04/11/2013. FINDINGS: Tracheostomy tube appears satisfactorily placed. See also neck CT from today reported separately. Large body habitus. Major airways are patent. Mild bilateral lower lobe atelectasis. Mild superimposed lung based mosaic attenuation favored to reflect gas trapping. No pleural effusion.  Cardiomegaly appears stable since 2014. No pericardial effusion. No thoracic lymphadenopathy. Thoracic aorta and central pulmonary artery is are normally enhancing. Negative visualized liver, spleen, pancreas, adrenal glands, left kidney, and bowel in the upper abdomen. No acute osseous abnormality identified. IMPRESSION: 1. Tracheostomy tube appears satisfactorily placed. See also neck CT from today reported separately. 2. No acute pulmonary findings other than mild atelectasis in gas trapping at the lung bases. 3. Stable cardiomegaly. Electronically Signed   By: HGenevie AnnM.D.   On: 02/22/2016 20:05   [place]dom[place] Complete  Addendum Date: 02/18/2016   ADDENDUM REPORT: 02/18/2016 10:50 ADDENDUM: As mentioned in main body of report, but not in Impression, fatty infiltration of the liver is noted. Electronically Signed   By: JMarijo Conception M.D.   On: 02/18/2016 10:50   Result Date: 02/18/2016 CLINICAL DATA:  Acute epigastric abdominal pain. EXAM: ABDOMEN ULTRASOUND COMPLETE COMPARISON:  None. FINDINGS: Gallbladder: No gallstones or wall thickening visualized. No sonographic Murphy sign noted by sonographer. Common bile duct: Diameter: 3.4 mm which is within normal limits. Liver: No focal lesion identified. Increased echogenicity is noted suggesting fatty infiltration. IVC: No abnormality visualized. Pancreas: Visualized portion unremarkable. Tail not visualized due to overlying bowel gas. Spleen: Size and appearance within normal limits. Right Kidney: Length: 11.4 cm. 4 cm simple cyst seen in upper pole. Echogenicity within normal limits. No mass or hydronephrosis visualized. Left Kidney: Length: 10.7 cm. 1.8 cm simple cyst is noted. Echogenicity within normal limits. No mass or hydronephrosis visualized. Abdominal aorta: No aneurysm visualized. Other findings: None. IMPRESSION: Bilateral simple renal cysts. No other abnormality seen in the abdomen. Electronically Signed: By: JMarijo Conception M.D. On: 02/18/2016  10:44   Dg Chest Port 1 View  Result Date: 03/06/2016 CLINICAL DATA:  Productive cough, trach. Hx of DM, HTN,stroke. severly SOB. EXAM: PORTABLE CHEST - 1 VIEW COMPARISON:  02/22/2016 CT and previous studies FINDINGS: Tracheostomy stable. Low lung volumes. Patchy interstitial infiltrates or subsegmental atelectasis in the lung bases slightly increased since previous exam. Heart size and mediastinal contours are within normal limits. No effusion. Visualized bones unremarkable. IMPRESSION: Low volumes with some increase in bibasilar atelectasis or infiltrate. Electronically Signed   By: DLucrezia EuropeM.D.   On: 03/06/2016 12:59   Dg Chest Port 1 View  Result Date: 02/21/2016 CLINICAL DATA:  Chest pain EXAM: PORTABLE CHEST 1 VIEW COMPARISON:  02/16/2016 FINDINGS: Cardiac shadow remains enlarged. Tracheostomy tube is noted in satisfactory position. The lungs are well aerated. Stable bibasilar changes  are again seen. No new focal infiltrate is noted. IMPRESSION: Stable bibasilar atelectasis/scarring. Electronically Signed   By: Inez Catalina M.D.   On: 02/21/2016 20:01     Assessment/Plan   ICD-9-CM ICD-10-CM   1. Irritable bowel syndrome, unspecified type 564.1 K58.9   2. Fibromyalgia 729.1 M79.7   3. Tracheostomy status ([place]) V44.0 Z93.0   4. Morbid obesity due to excess calories ([place]) 278.01 E66.01   5. Type 2 diabetes mellitus with stage 3 chronic kidney disease, without long-term current use of insulin ([facility]) 250.40 E11.22    585.3 N18.3   6. Essential hypertension 401.9 I10   7. Polyneuropathy ([facility]) 356.9 G62.9   8. Chronic pain syndrome 338.4 G89.4   9. Anxiety state 300.00 F41.1   10. Iron deficiency anemia, unspecified iron deficiency anemia type 280.9 D50.9   11. Sleep apnea, unspecified type 780.57 G47.30     She has completed levaquin tx  F/u labs as ordered  Start levsin 0.125 mg TID for IBS  Cont other meds as ordered  F/u with specialists as scheduled  PT/OT/ST as  ordered  Follow CBGs  Trach care as indicated. Cont O2 as ordered  GOAL: short term rehab and d/c home when medically appropriate. Communicated with pt and nursing.  Will follow  Barbarann Kelly S. Perlie Gold  [facility] [address] [place], [place] [address] [phone] Cell (Monday-Friday 8 AM - 5 PM) [phone] After 5 PM and follow prompts

## 2016-03-18 NOTE — ED Notes (Signed)
Delay in lab draw,  Pt currently in exray

## 2016-03-18 NOTE — ED Triage Notes (Signed)
Patient from [facility], c/o chest pain. Patient had trach placed august 1st, chest xray shows PNA despite being treated with antibiotics. 8/10 chest pain, nonproductive cough x5 days. Patient alert and oriented x4, ambulatory.

## 2016-03-19 MED ORDER — IOPAMIDOL (ISOVUE-370) INJECTION 76%
80.0000 mL | Freq: Once | INTRAVENOUS | Status: AC | PRN
  Administered 2016-03-19: 80 mL via INTRAVENOUS

## 2016-03-19 MED ORDER — AEROCHAMBER PLUS W/MASK MISC
1.0000 | Freq: Once | Status: AC
  Administered 2016-03-19: 1
  Filled 2016-03-19: qty 1

## 2016-03-19 MED ORDER — BENZONATATE 100 MG PO CAPS: 100.0000 mg | ORAL_CAPSULE | Freq: Three times a day (TID) | ORAL | 0 refills | Status: DC

## 2016-03-19 MED ORDER — ALBUTEROL SULFATE HFA 108 (90 BASE) MCG/ACT IN AERS
2.0000 | INHALATION_SPRAY | RESPIRATORY_TRACT | Status: DC | PRN
  Administered 2016-03-19: 2 via RESPIRATORY_TRACT
  Filled 2016-03-19: qty 6.7

## 2016-03-19 MED ORDER — BENZONATATE 100 MG PO CAPS
100.0000 mg | ORAL_CAPSULE | Freq: Once | ORAL | Status: AC
  Administered 2016-03-19: 100 mg via ORAL
  Filled 2016-03-19: qty 1

## 2016-03-19 NOTE — Discharge Instructions (Signed)
1. Medications: tessalon, usual home medications 2. Treatment: rest, drink plenty of fluids,  3. Follow Up: Please followup with your primary doctor in 2-3 days for discussion of your diagnoses and further evaluation after today's visit; if you do not have a primary care doctor use the resource guide provided to find one; Return to the ER for high fevers, difficulty breathing or other concerning symptoms

## 2016-03-22 ENCOUNTER — Non-Acute Institutional Stay (SKILLED_NURSING_FACILITY): Payer: Medicaid Other | Admitting: Internal Medicine

## 2016-03-22 ENCOUNTER — Encounter: Payer: Self-pay | Admitting: Internal Medicine

## 2016-03-22 DIAGNOSIS — D509 Iron deficiency anemia, unspecified: Secondary | ICD-10-CM

## 2016-03-22 DIAGNOSIS — Z930 Tracheostomy status: Secondary | ICD-10-CM | POA: Diagnosis not present

## 2016-03-22 DIAGNOSIS — G894 Chronic pain syndrome: Secondary | ICD-10-CM

## 2016-03-22 DIAGNOSIS — E1122 Type 2 diabetes mellitus with diabetic chronic kidney disease: Secondary | ICD-10-CM

## 2016-03-22 DIAGNOSIS — M797 Fibromyalgia: Secondary | ICD-10-CM | POA: Diagnosis not present

## 2016-03-22 DIAGNOSIS — K589 Irritable bowel syndrome without diarrhea: Secondary | ICD-10-CM | POA: Diagnosis not present

## 2016-03-22 DIAGNOSIS — N183 Chronic kidney disease, stage 3 unspecified: Secondary | ICD-10-CM

## 2016-03-22 DIAGNOSIS — I10 Essential (primary) hypertension: Secondary | ICD-10-CM | POA: Diagnosis not present

## 2016-03-22 DIAGNOSIS — F411 Generalized anxiety disorder: Secondary | ICD-10-CM | POA: Diagnosis not present

## 2016-03-22 DIAGNOSIS — G4730 Sleep apnea, unspecified: Secondary | ICD-10-CM

## 2016-03-22 DIAGNOSIS — G629 Polyneuropathy, unspecified: Secondary | ICD-10-CM | POA: Diagnosis not present

## 2016-03-22 NOTE — Progress Notes (Signed)
Patient ID: Yesenia [name], female   DOB: [birth year], 55 y.o.   MRN: [id number]    DATE: 03/22/2016  Location:    [facility] Room Number: 116 A Place of Service: SNF (31)   Extended Emergency Contact Information Primary Emergency Contact: Howell,Catrina  [place] Phone: [phone] Relation: Daughter  Advanced Directive information Does patient have an advance directive?: Yes, Type of Advance Directive: Out of facility DNR (pink MOST or yellow form), Does patient want to make changes to advanced directive?: No - Patient declined  Chief Complaint  Patient presents with  . Discharge Note    HPI:  55 yo female seen today ford/c from SNF following short term rehab initially for severe OSA, DM, fever, cough, abdominal pain, constipation, CKD, morbid obesity. She was readmitted to SNF following hospital stay for acute hypoxic respiratory failure due to early tracheobronchitis, hypokalemia, morbid obesity, OSA/obesity hypoventilation syndrome, GERD, DMII, depression. She has completed rehab and will d/c home with [facility]. She will need DME trach collar, trach cleaning kit, suction machine/supplies, cath-n-sleeve 12 French, ambu bag, trach humidification with O2 bleed-in, 4L/min of O2 continuous/stationary and portable O2 via trach collar, disposable inner cannulas and Shirley 4 trach.   She has no concerns today. She has been taught how to care for her trach.  BRIEF SUMMARY OF REHAB: 1st admission, she presented to hospital for elective tracheostomy due to severe OSA, unable to tolerate CPAP. No immediate post op complications. She c/o epigastric pain but thought to be musculoskeletal vs diet related. Trach changed to cuffless 4  Shiley tube on POD 4. She tolerated Passy-Muir valve for speech. Keflex added at d/c due to odor, soreness and skin redness at trach site. Cr 1.11; Albumin 3.0; Hgb 10.2; WBC 7.7K; HIV Nonreactive at d/c. On 2nd admission, she presented  with acute hypoxic respiratory failure due to early tracheobronchitis. Respiratory cx  (+) enterobacter. CXR neg for acute process. She was d/cd on levaquin. ENT followed. Electrolytes repleted. BMI 51.74 kg/m2. Glipizide started for elevated BS. Hgb 9.4; Cr 1.11; albumin 2.9; K 3.9 at d/c. She had c/o insomnia and increased worry at rehab. She was followed by psych services.  Hx anemia - stable. Hgb 11. She takes iron  OSA/obesity hypoventilation syndrome - s/p trach. She did tolerate CPAP. She has morbid obesity which contributes to OSA. She takes albuterol HFA  HTN - BP stable on HCTZ   DM - previously diet controlled. A1c 6.1%. currently on glipizide due to hyperglycemia. No low BS reactions  Chronic pain syndrome/FMS - stable on norco, lyrica, gabapentin  Insomnia/anxiety/depression - stable on trazodone, lexapro, klonopin and buspar  GERD/constipation - stable on protonix, zofran prn and bowel regimen.  CKD - stable. Cr 1.24  Past Medical History:  Diagnosis Date  . Anemia   . Anemia   . Chronic pain   . CKD (chronic kidney disease)   . Diabetes mellitus without complication (HCC)    diet controlled. does not check CBG's  . Fibromyalgia    hospitilized 12/16 due to inability to walk  . Hyperlipemia   . Hypertension   . Insomnia   . Neuropathy ([place])   . Right sided weakness   . Stroke [facility]) 2014?   no residual weakness   . Vitamin deficiency    Vit D    Past Surgical History:  Procedure Laterality Date  . [place]  . LEG SURGERY Right 2007   Surgery x3 2  arthroscopies and one rod  . OOPHORECTOMY Right 1993?  . TRACHEOSTOMY TUBE PLACEMENT N/A 02/13/2016   Procedure: TRACHEOSTOMY;  Surgeon: Melida Quitter, MD;  Location: [facility];  Service: ENT;  Laterality: N/A;    Patient Care Team: Sofie Rower, PA-C as PCP - General (Physician Assistant) Sofie Rower, PA-C as Physician Assistant (Physician Assistant)  Social History   Social History   . Marital status: Single    Spouse name: N/A  . Number of children: 2  . Years of education: 12th    Occupational History  . [employer]    Social History Main Topics  . Smoking status: Never Smoker  . Smokeless tobacco: Never Used  . Alcohol use No  . Drug use: No  . Sexual activity: No   Other Topics Concern  . Not on file   Social History Narrative   Reports no caffeine use      reports that she has never smoked. She has never used smokeless tobacco. She reports that she does not drink alcohol or use drugs.  Family History  Problem Relation Age of Onset  . Breast cancer Mother   . Leukemia Brother   . Breast cancer Maternal Grandmother    Family Status  Relation Status  . Mother Deceased  . Brother   . Maternal Grandmother     Immunization History  Administered Date(s) Administered  . Influenza,inj,Quad PF,36+ Mos 04/12/2013  . PPD Test 02/28/2016  . Pneumococcal Polysaccharide-23 04/12/2013    Allergies  Allergen Reactions  . Buprenorphine Hcl Hives  . Morphine And Related Hives and Dermatitis    Medications: Patient's Medications  New Prescriptions   No medications on file  Previous Medications   ALBUTEROL (PROVENTIL HFA;VENTOLIN HFA) 108 (90 BASE) MCG/ACT INHALER    Inhale 2 puffs into the lungs every 6 (six) hours as needed for wheezing.   BACITRACIN OINTMENT    Apply 1 application topically 2 (two) times daily. To trach site   BENZONATATE (TESSALON) 100 MG CAPSULE    Take 1 capsule (100 mg total) by mouth every 8 (eight) hours.   BISACODYL (DULCOLAX) 5 MG EC TABLET    Take 1 tablet (5 mg total) by mouth daily as needed for moderate constipation.   BUSPIRONE (BUSPAR) 15 MG TABLET    Take 15 mg by mouth 2 (two) times daily.   CLONAZEPAM (KLONOPIN) 0.5 MG TABLET    Take 0.5 mg by mouth at bedtime.   DEXTROMETHORPHAN-GUAIFENESIN (MUCINEX DM) 30-600 MG 12HR TABLET    Take 1 tablet by mouth 2 (two) times daily.   ESCITALOPRAM (LEXAPRO) 10 MG TABLET    Take 10  mg by mouth daily.    FERROUS SULFATE 325 (65 FE) MG TABLET    Take 325 mg by mouth daily.   GABAPENTIN (NEURONTIN) 300 MG CAPSULE    Take 300 mg by mouth daily as needed. For pain   GLIPIZIDE (GLUCOTROL XL) 2.5 MG 24 HR TABLET    Take 1 tablet (2.5 mg total) by mouth daily with breakfast.   HYDROCHLOROTHIAZIDE (HYDRODIURIL) 25 MG TABLET    Take 0.5 tablets (12.5 mg total) by mouth daily.   HYDROCODONE-ACETAMINOPHEN (NORCO/VICODIN) 5-325 MG TABLET    Take 1-2 tablets by mouth every 4 (four) hours as needed for moderate pain or severe pain. DO NOT EXCEED 3GM OF APAP IN 24 HOURS FROM ALL SOURCES   HYOSCYAMINE (ANASPAZ) 0.125 MG TBDP DISINTERGRATING TABLET    Place 0.125 mg under the tongue 3 (three) times daily  as needed. For Gas-esophageal  Reflux   ONDANSETRON (ZOFRAN) 4 MG TABLET    Take 4 mg by mouth daily. Patient states she is taking every day per patient   PANTOPRAZOLE (PROTONIX) 40 MG TABLET    Take 1 tablet (40 mg total) by mouth daily.   POLYETHYLENE GLYCOL (MIRALAX / GLYCOLAX) PACKET    Take 17 g by mouth daily as needed for mild constipation.   PREGABALIN (LYRICA) 50 MG CAPSULE    Take 1 capsule (50 mg total) by mouth daily.   SACCHAROMYCES BOULARDII (FLORASTOR) 250 MG CAPSULE    Take 250 mg by mouth 2 (two) times daily.   TRAZODONE (DESYREL) 150 MG TABLET    Take 150 mg by mouth at bedtime.  Modified Medications   No medications on file  Discontinued Medications   No medications on file    Review of Systems  Unable to perform ROS: Psychiatric disorder    Vitals:   03/22/16 0928  BP: (!) 154/80  Pulse: 68  Resp: 20  Temp: 97.2 F (36.2 C)  TempSrc: Oral  SpO2: 96%  Weight: (!) 319 lb 9.6 oz (145 kg)  Height: _0  (1.702 m)   Body mass index is 50.06 kg/m.  Physical Exam  Constitutional: She appears well-developed and well-nourished.  Neck:  Trach intact - no purulent mucous d/c  Musculoskeletal: She exhibits edema and tenderness.  Neurological: She is alert.    Skin: Skin is warm and dry.  Psychiatric: Her speech is normal and behavior is normal. Her mood appears anxious.     Labs reviewed: Nursing Home on 03/22/2016  Component Date Value Ref Range Status  . Hemoglobin 03/18/2016 10.7* 12.0 - 16.0 g/dL Final  . HCT 03/18/2016 32* 36 - 46 % Final  . Neutrophils Absolute 03/18/2016 2  /L Final  . Platelets 03/18/2016 316  150 - 399 K/L Final  . WBC 03/18/2016 5.8  10^3/mL Final  . Glucose 03/18/2016 83  mg/dL Final  . BUN 03/18/2016 20  4 - 21 mg/dL Final  . Creatinine 03/18/2016 1.2* 0.5 - 1.1 mg/dL Final  . Potassium 03/18/2016 3.9  3.4 - 5.3 mmol/L Final  . Sodium 03/18/2016 144  137 - 147 mmol/L Final  . Alkaline Phosphatase 03/18/2016 63  25 - 125 U/L Final  . ALT 03/18/2016 16  7 - 35 U/L Final  . AST 03/18/2016 13  13 - 35 U/L Final  . Bilirubin, Total 03/18/2016 0.3  mg/dL Final  Admission on 03/18/2016, Discharged on 03/19/2016  Component Date Value Ref Range Status  . WBC 03/18/2016 8.0  4.0 - 10.5 K/uL Final  . RBC 03/18/2016 3.91  3.87 - 5.11 MIL/uL Final  . Hemoglobin 03/18/2016 11.0* 12.0 - 15.0 g/dL Final  . HCT 03/18/2016 34.5* 36.0 - 46.0 % Final  . MCV 03/18/2016 88.2  78.0 - 100.0 fL Final  . MCH 03/18/2016 28.1  26.0 - 34.0 pg Final  . MCHC 03/18/2016 31.9  30.0 - 36.0 g/dL Final  . RDW 03/18/2016 15.2  11.5 - 15.5 % Final  . Platelets 03/18/2016 298  150 - 400 K/uL Final  . Neutrophils Relative % 03/18/2016 48  % Final  . Neutro Abs 03/18/2016 3.9  1.7 - 7.7 K/uL Final  . Lymphocytes Relative 03/18/2016 40  % Final  . Lymphs Abs 03/18/2016 3.2  0.7 - 4.0 K/uL Final  . Monocytes Relative 03/18/2016 8  % Final  . Monocytes Absolute 03/18/2016 0.7  0.1 - 1.0 K/uL  Final  . Eosinophils Relative 03/18/2016 3  % Final  . Eosinophils Absolute 03/18/2016 0.2  0.0 - 0.7 K/uL Final  . Basophils Relative 03/18/2016 1  % Final  . Basophils Absolute 03/18/2016 0.1  0.0 - 0.1 K/uL Final  . Sodium 03/18/2016 140  135 - 145  mmol/L Final  . Potassium 03/18/2016 3.8  3.5 - 5.1 mmol/L Final  . Chloride 03/18/2016 107  101 - 111 mmol/L Final  . CO2 03/18/2016 26  22 - 32 mmol/L Final  . Glucose, Bld 03/18/2016 97  65 - 99 mg/dL Final  . BUN 03/18/2016 17  6 - 20 mg/dL Final  . Creatinine, Ser 03/18/2016 1.24* 0.44 - 1.00 mg/dL Final  . Calcium 03/18/2016 9.0  8.9 - 10.3 mg/dL Final  . Total Protein 03/18/2016 6.7  6.5 - 8.1 g/dL Final  . Albumin 03/18/2016 3.5  3.5 - 5.0 g/dL Final  . AST 03/18/2016 21  15 - 41 U/L Final  . ALT 03/18/2016 21  14 - 54 U/L Final  . Alkaline Phosphatase 03/18/2016 59  38 - 126 U/L Final  . Total Bilirubin 03/18/2016 0.4  0.3 - 1.2 mg/dL Final  . GFR calc non Af Amer 03/18/2016 48* >60 mL/min Final  . GFR calc Af Amer 03/18/2016 56* >60 mL/min Final   Comment: (NOTE) The eGFR has been calculated using the CKD EPI equation. This calculation has not been validated in all clinical situations. eGFR's persistently <60 mL/min signify possible Chronic Kidney Disease.   . Anion gap 03/18/2016 7  5 - 15 Final  . Lactic Acid, Venous 03/18/2016 0.85  0.5 - 1.9 mmol/L Final  . Troponin i, poc 03/18/2016 0.00  0.00 - 0.08 ng/mL Final  . Comment 3 03/18/2016          Final   Comment: Due to the release kinetics of cTnI, a negative result within the first hours of the onset of symptoms does not rule out myocardial infarction with certainty. If myocardial infarction is still suspected, repeat the test at appropriate intervals.   Admission on 03/06/2016, Discharged on 03/11/2016  Component Date Value Ref Range Status  . Sodium 03/06/2016 140  135 - 145 mmol/L Final  . Potassium 03/06/2016 3.1* 3.5 - 5.1 mmol/L Final  . Chloride 03/06/2016 105  101 - 111 mmol/L Final  . CO2 03/06/2016 26  22 - 32 mmol/L Final  . Glucose, Bld 03/06/2016 110* 65 - 99 mg/dL Final  . BUN 03/06/2016 16  6 - 20 mg/dL Final  . Creatinine, Ser 03/06/2016 1.26* 0.44 - 1.00 mg/dL Final  . Calcium 03/06/2016 9.5   8.9 - 10.3 mg/dL Final  . GFR calc non Af Amer 03/06/2016 47* >60 mL/min Final  . GFR calc Af Amer 03/06/2016 55* >60 mL/min Final   Comment: (NOTE) The eGFR has been calculated using the CKD EPI equation. This calculation has not been validated in all clinical situations. eGFR's persistently <60 mL/min signify possible Chronic Kidney Disease.   . Anion gap 03/06/2016 9  5 - 15 Final  . WBC 03/06/2016 7.1  4.0 - 10.5 K/uL Final  . RBC 03/06/2016 4.18  3.87 - 5.11 MIL/uL Final  . Hemoglobin 03/06/2016 11.8* 12.0 - 15.0 g/dL Final  . HCT 03/06/2016 36.8  36.0 - 46.0 % Final  . MCV 03/06/2016 88.0  78.0 - 100.0 fL Final  . MCH 03/06/2016 28.2  26.0 - 34.0 pg Final  . MCHC 03/06/2016 32.1  30.0 - 36.0 g/dL Final  .  RDW 03/06/2016 14.1  11.5 - 15.5 % Final  . Platelets 03/06/2016 396  150 - 400 K/uL Final  . Troponin i, poc 03/06/2016 0.01  0.00 - 0.08 ng/mL Final  . Comment 3 03/06/2016          Final   Comment: Due to the release kinetics of cTnI, a negative result within the first hours of the onset of symptoms does not rule out myocardial infarction with certainty. If myocardial infarction is still suspected, repeat the test at appropriate intervals.   Marland Kitchen D-Dimer, Quant 03/06/2016 0.36  0.00 - 0.50 ug/mL-FEU Final   Comment: (NOTE) At the manufacturer cut-off of 0.50 ug/mL FEU, this assay has been documented to exclude PE with a sensitivity and negative predictive value of 97 to 99%.  At this time, this assay has not been approved by the FDA to exclude DVT/VTE. Results should be correlated with clinical presentation.   Marland Kitchen Specimen Description 03/11/2016 BLOOD RIGHT ANTECUBITAL   Final  . Special Requests 03/11/2016 BOTTLES DRAWN AEROBIC AND ANAEROBIC 5CC   Final  . Culture 03/11/2016 NO GROWTH 5 DAYS   Final  . Report Status 03/11/2016 03/11/2016 FINAL   Final  . Specimen Description 03/11/2016 BLOOD LEFT ANTECUBITAL   Final  . Special Requests 03/11/2016 BOTTLES DRAWN AEROBIC  AND ANAEROBIC 5CC   Final  . Culture 03/11/2016 NO GROWTH 5 DAYS   Final  . Report Status 03/11/2016 03/11/2016 FINAL   Final  . Lactic Acid, Venous 03/06/2016 3.01* 0.5 - 1.9 mmol/L Final  . Comment 03/06/2016 NOTIFIED PHYSICIAN   Final  . pH, Ven 03/06/2016 7.541* 7.250 - 7.300 Final  . pCO2, Ven 03/06/2016 34.0* 45.0 - 50.0 mmHg Final  . pO2, Ven 03/06/2016 30.0* 31.0 - 45.0 mmHg Final  . Bicarbonate 03/06/2016 29.2* 20.0 - 24.0 mEq/L Final  . TCO2 03/06/2016 30  0 - 100 mmol/L Final  . O2 Saturation 03/06/2016 66.0  % Final  . Acid-Base Excess 03/06/2016 7.0* 0.0 - 2.0 mmol/L Final  . Patient temperature 03/06/2016 HIDE   Final  . Sample type 03/06/2016 VENOUS   Final  . Comment 03/06/2016 NOTIFIED PHYSICIAN   Final  . Lactic Acid, Venous 03/06/2016 2.18* 0.5 - 1.9 mmol/L Final  . Comment 03/06/2016 NOTIFIED PHYSICIAN   Final  . Specimen Description 03/10/2016 URINE, RANDOM   Final  . Special Requests 03/10/2016 NONE   Final  . Culture 03/10/2016 40,000 COLONIES/mL ENTEROCOCCUS SPECIES*  Final  . Report Status 03/10/2016 03/10/2016 FINAL   Final  . Organism ID, Bacteria 03/10/2016 ENTEROCOCCUS SPECIES*  Final  . L. pneumophila Serogp 1 Ur Ag 03/08/2016 Negative  Negative Final   Comment: (NOTE) Presumptive negative for L. pneumophila serogroup 1 antigen in urine, suggesting no recent or current infection. Legionnaires' disease cannot be ruled out since other serogroups and species may also cause disease. Performed At: [facility] [place], [place] [id number] Lindon Romp MD DP:[id number]   . Source of Sample 03/08/2016 URINE, RANDOM   Final  . Strep Pneumo Urinary Antigen 03/07/2016 NEGATIVE  NEGATIVE Final   Comment:        Infection due to S. pneumoniae cannot be absolutely ruled out since the antigen present may be below the detection limit of the test.   . Specimen Description 03/09/2016 TRACHEAL ASPIRATE   Final  . Special Requests  03/09/2016 NONE   Final  . Gram Stain 03/09/2016    Final  Value:FEW WBC PRESENT, PREDOMINANTLY PMN RARE GRAM POSITIVE RODS   . Culture 03/09/2016 FEW ENTEROBACTER AEROGENES   Final  . Report Status 03/09/2016 03/09/2016 FINAL   Final  . Organism ID, Bacteria 03/09/2016 ENTEROBACTER AEROGENES   Final  . Sodium 03/07/2016 141  135 - 145 mmol/L Final  . Potassium 03/07/2016 3.4* 3.5 - 5.1 mmol/L Final  . Chloride 03/07/2016 108  101 - 111 mmol/L Final  . CO2 03/07/2016 27  22 - 32 mmol/L Final  . Glucose, Bld 03/07/2016 127* 65 - 99 mg/dL Final  . BUN 03/07/2016 13  6 - 20 mg/dL Final  . Creatinine, Ser 03/07/2016 1.26* 0.44 - 1.00 mg/dL Final  . Calcium 03/07/2016 8.8* 8.9 - 10.3 mg/dL Final  . Total Protein 03/07/2016 6.5  6.5 - 8.1 g/dL Final  . Albumin 03/07/2016 2.9* 3.5 - 5.0 g/dL Final  . AST 03/07/2016 16  15 - 41 U/L Final  . ALT 03/07/2016 16  14 - 54 U/L Final  . Alkaline Phosphatase 03/07/2016 60  38 - 126 U/L Final  . Total Bilirubin 03/07/2016 0.6  0.3 - 1.2 mg/dL Final  . GFR calc non Af Amer 03/07/2016 47* >60 mL/min Final  . GFR calc Af Amer 03/07/2016 55* >60 mL/min Final   Comment: (NOTE) The eGFR has been calculated using the CKD EPI equation. This calculation has not been validated in all clinical situations. eGFR's persistently <60 mL/min signify possible Chronic Kidney Disease.   . Anion gap 03/07/2016 6  5 - 15 Final  . WBC 03/07/2016 6.4  4.0 - 10.5 K/uL Final  . RBC 03/07/2016 3.72* 3.87 - 5.11 MIL/uL Final  . Hemoglobin 03/07/2016 10.3* 12.0 - 15.0 g/dL Final  . HCT 03/07/2016 32.9* 36.0 - 46.0 % Final  . MCV 03/07/2016 88.4  78.0 - 100.0 fL Final  . MCH 03/07/2016 27.7  26.0 - 34.0 pg Final  . MCHC 03/07/2016 31.3  30.0 - 36.0 g/dL Final  . RDW 03/07/2016 14.3  11.5 - 15.5 % Final  . Platelets 03/07/2016 311  150 - 400 K/uL Final  . Prothrombin Time 03/07/2016 15.8* 11.4 - 15.2 seconds Final  . INR 03/07/2016 1.26   Final  .  Glucose-Capillary 03/07/2016 143* 65 - 99 mg/dL Final  . Glucose-Capillary 03/07/2016 140* 65 - 99 mg/dL Final  . Glucose-Capillary 03/07/2016 153* 65 - 99 mg/dL Final  . Comment 1 03/07/2016 Notify RN   Final  . Comment 2 03/07/2016 Document in Chart   Final  . Sodium 03/08/2016 143  135 - 145 mmol/L Final  . Potassium 03/08/2016 3.8  3.5 - 5.1 mmol/L Final  . Chloride 03/08/2016 110  101 - 111 mmol/L Final  . CO2 03/08/2016 26  22 - 32 mmol/L Final  . Glucose, Bld 03/08/2016 122* 65 - 99 mg/dL Final  . BUN 03/08/2016 13  6 - 20 mg/dL Final  . Creatinine, Ser 03/08/2016 1.28* 0.44 - 1.00 mg/dL Final  . Calcium 03/08/2016 9.0  8.9 - 10.3 mg/dL Final  . GFR calc non Af Amer 03/08/2016 47* >60 mL/min Final  . GFR calc Af Amer 03/08/2016 54* >60 mL/min Final   Comment: (NOTE) The eGFR has been calculated using the CKD EPI equation. This calculation has not been validated in all clinical situations. eGFR's persistently <60 mL/min signify possible Chronic Kidney Disease.   . Anion gap 03/08/2016 7  5 - 15 Final  . Magnesium 03/08/2016 1.9  1.7 - 2.4 mg/dL Final  . Glucose-Capillary  03/07/2016 120* 65 - 99 mg/dL Final  . Glucose-Capillary 03/08/2016 117* 65 - 99 mg/dL Final  . Glucose-Capillary 03/08/2016 173* 65 - 99 mg/dL Final  . Glucose-Capillary 03/08/2016 88  65 - 99 mg/dL Final  . Glucose-Capillary 03/08/2016 138* 65 - 99 mg/dL Final  . Glucose-Capillary 03/09/2016 122* 65 - 99 mg/dL Final  . Glucose-Capillary 03/09/2016 117* 65 - 99 mg/dL Final  . Glucose-Capillary 03/09/2016 98  65 - 99 mg/dL Final  . Glucose-Capillary 03/09/2016 142* 65 - 99 mg/dL Final  . Glucose-Capillary 03/10/2016 112* 65 - 99 mg/dL Final  . Glucose-Capillary 03/10/2016 135* 65 - 99 mg/dL Final  . Sodium 03/11/2016 141  135 - 145 mmol/L Final  . Potassium 03/11/2016 3.9  3.5 - 5.1 mmol/L Final  . Chloride 03/11/2016 110  101 - 111 mmol/L Final  . CO2 03/11/2016 26  22 - 32 mmol/L Final  . Glucose, Bld  03/11/2016 106* 65 - 99 mg/dL Final  . BUN 03/11/2016 13  6 - 20 mg/dL Final  . Creatinine, Ser 03/11/2016 1.11* 0.44 - 1.00 mg/dL Final  . Calcium 03/11/2016 8.9  8.9 - 10.3 mg/dL Final  . Total Protein 03/11/2016 6.0* 6.5 - 8.1 g/dL Final  . Albumin 03/11/2016 2.9* 3.5 - 5.0 g/dL Final  . AST 03/11/2016 24  15 - 41 U/L Final  . ALT 03/11/2016 23  14 - 54 U/L Final  . Alkaline Phosphatase 03/11/2016 53  38 - 126 U/L Final  . Total Bilirubin 03/11/2016 0.3  0.3 - 1.2 mg/dL Final  . GFR calc non Af Amer 03/11/2016 55* >60 mL/min Final  . GFR calc Af Amer 03/11/2016 >60  >60 mL/min Final   Comment: (NOTE) The eGFR has been calculated using the CKD EPI equation. This calculation has not been validated in all clinical situations. eGFR's persistently <60 mL/min signify possible Chronic Kidney Disease.   . Anion gap 03/11/2016 5  5 - 15 Final  . WBC 03/11/2016 7.4  4.0 - 10.5 K/uL Final  . RBC 03/11/2016 3.42* 3.87 - 5.11 MIL/uL Final  . Hemoglobin 03/11/2016 9.4* 12.0 - 15.0 g/dL Final  . HCT 03/11/2016 30.2* 36.0 - 46.0 % Final  . MCV 03/11/2016 88.3  78.0 - 100.0 fL Final  . MCH 03/11/2016 27.5  26.0 - 34.0 pg Final  . MCHC 03/11/2016 31.1  30.0 - 36.0 g/dL Final  . RDW 03/11/2016 14.7  11.5 - 15.5 % Final  . Platelets 03/11/2016 294  150 - 400 K/uL Final  . Neutrophils Relative % 03/11/2016 49  % Final  . Neutro Abs 03/11/2016 3.6  1.7 - 7.7 K/uL Final  . Lymphocytes Relative 03/11/2016 40  % Final  . Lymphs Abs 03/11/2016 2.9  0.7 - 4.0 K/uL Final  . Monocytes Relative 03/11/2016 7  % Final  . Monocytes Absolute 03/11/2016 0.5  0.1 - 1.0 K/uL Final  . Eosinophils Relative 03/11/2016 4  % Final  . Eosinophils Absolute 03/11/2016 0.3  0.0 - 0.7 K/uL Final  . Basophils Relative 03/11/2016 0  % Final  . Basophils Absolute 03/11/2016 0.0  0.0 - 0.1 K/uL Final  . Glucose-Capillary 03/10/2016 105* 65 - 99 mg/dL Final  . Glucose-Capillary 03/10/2016 122* 65 - 99 mg/dL Final  .  Glucose-Capillary 03/11/2016 100* 65 - 99 mg/dL Final  . Comment 1 03/11/2016 Document in Chart   Final  . Glucose-Capillary 03/11/2016 120* 65 - 99 mg/dL Final  . Glucose-Capillary 03/11/2016 105* 65 - 99 mg/dL Final  Admission  on 02/21/2016, Discharged on 02/25/2016  Component Date Value Ref Range Status  . Sodium 02/21/2016 137  135 - 145 mmol/L Final  . Potassium 02/21/2016 4.4  3.5 - 5.1 mmol/L Final  . Chloride 02/21/2016 100* 101 - 111 mmol/L Final  . CO2 02/21/2016 29  22 - 32 mmol/L Final  . Glucose, Bld 02/21/2016 107* 65 - 99 mg/dL Final  . BUN 02/21/2016 9  6 - 20 mg/dL Final  . Creatinine, Ser 02/21/2016 1.13* 0.44 - 1.00 mg/dL Final  . Calcium 02/21/2016 8.9  8.9 - 10.3 mg/dL Final  . Total Protein 02/21/2016 7.2  6.5 - 8.1 g/dL Final  . Albumin 02/21/2016 3.0* 3.5 - 5.0 g/dL Final  . AST 02/21/2016 29  15 - 41 U/L Final  . ALT 02/21/2016 33  14 - 54 U/L Final  . Alkaline Phosphatase 02/21/2016 101  38 - 126 U/L Final  . Total Bilirubin 02/21/2016 1.0  0.3 - 1.2 mg/dL Final  . GFR calc non Af Amer 02/21/2016 54* >60 mL/min Final  . GFR calc Af Amer 02/21/2016 >60  >60 mL/min Final   Comment: (NOTE) The eGFR has been calculated using the CKD EPI equation. This calculation has not been validated in all clinical situations. eGFR's persistently <60 mL/min signify possible Chronic Kidney Disease.   . Anion gap 02/21/2016 8  5 - 15 Final  . WBC 02/21/2016 11.8* 4.0 - 10.5 K/uL Final  . RBC 02/21/2016 4.23  3.87 - 5.11 MIL/uL Final  . Hemoglobin 02/21/2016 12.1  12.0 - 15.0 g/dL Final  . HCT 02/21/2016 36.9  36.0 - 46.0 % Final  . MCV 02/21/2016 87.2  78.0 - 100.0 fL Final  . MCH 02/21/2016 28.6  26.0 - 34.0 pg Final  . MCHC 02/21/2016 32.8  30.0 - 36.0 g/dL Final  . RDW 02/21/2016 13.8  11.5 - 15.5 % Final  . Platelets 02/21/2016 404* 150 - 400 K/uL Final  . Neutrophils Relative % 02/21/2016 51  % Final  . Neutro Abs 02/21/2016 6.0  1.7 - 7.7 K/uL Final  . Lymphocytes  Relative 02/21/2016 37  % Final  . Lymphs Abs 02/21/2016 4.4* 0.7 - 4.0 K/uL Final  . Monocytes Relative 02/21/2016 10  % Final  . Monocytes Absolute 02/21/2016 1.1* 0.1 - 1.0 K/uL Final  . Eosinophils Relative 02/21/2016 2  % Final  . Eosinophils Absolute 02/21/2016 0.3  0.0 - 0.7 K/uL Final  . Basophils Relative 02/21/2016 0  % Final  . Basophils Absolute 02/21/2016 0.0  0.0 - 0.1 K/uL Final  . Troponin i, poc 02/21/2016 0.01  0.00 - 0.08 ng/mL Final  . Comment 3 02/21/2016          Final   Comment: Due to the release kinetics of cTnI, a negative result within the first hours of the onset of symptoms does not rule out myocardial infarction with certainty. If myocardial infarction is still suspected, repeat the test at appropriate intervals.   . Lactic Acid, Venous 02/21/2016 1.13  0.5 - 1.9 mmol/L Final  . Specimen Description 02/26/2016 BLOOD LEFT ANTECUBITAL   Final  . Special Requests 02/26/2016 BOTTLES DRAWN AEROBIC AND ANAEROBIC 5CC   Final  . Culture 02/26/2016 NO GROWTH 5 DAYS   Final  . Report Status 02/26/2016 02/26/2016 FINAL   Final  . Specimen Description 02/26/2016 BLOOD LEFT HAND   Final  . Special Requests 02/26/2016 BOTTLES DRAWN AEROBIC AND ANAEROBIC 5CC   Final  . Culture 02/26/2016 NO GROWTH 5  DAYS   Final  . Report Status 02/26/2016 02/26/2016 FINAL   Final  . HIV Screen 4th Generation wRfx 02/22/2016 Non Reactive  Non Reactive Final   Comment: (NOTE) Performed At: [facility] [place], [place] [id number] Lindon Romp MD [id number]   . Sodium 02/22/2016 139  135 - 145 mmol/L Final  . Potassium 02/22/2016 3.6  3.5 - 5.1 mmol/L Final  . Chloride 02/22/2016 97* 101 - 111 mmol/L Final  . CO2 02/22/2016 30  22 - 32 mmol/L Final  . Glucose, Bld 02/22/2016 138* 65 - 99 mg/dL Final  . BUN 02/22/2016 11  6 - 20 mg/dL Final  . Creatinine, Ser 02/22/2016 1.54* 0.44 - 1.00 mg/dL Final  . Calcium 02/22/2016 9.1  8.9 - 10.3 mg/dL Final  .  GFR calc non Af Amer 02/22/2016 37* >60 mL/min Final  . GFR calc Af Amer 02/22/2016 43* >60 mL/min Final   Comment: (NOTE) The eGFR has been calculated using the CKD EPI equation. This calculation has not been validated in all clinical situations. eGFR's persistently <60 mL/min signify possible Chronic Kidney Disease.   . Anion gap 02/22/2016 12  5 - 15 Final  . Glucose-Capillary 02/22/2016 124* 65 - 99 mg/dL Final  . Hemoglobin 02/22/2016 10.4* 12.0 - 15.0 g/dL Final  . HCT 02/22/2016 33.3* 36.0 - 46.0 % Final  . Hemoglobin 02/22/2016 10.4* 12.0 - 15.0 g/dL Final  . HCT 02/22/2016 32.4* 36.0 - 46.0 % Final  . Hemoglobin 02/22/2016 10.6* 12.0 - 15.0 g/dL Final  . HCT 02/22/2016 33.5* 36.0 - 46.0 % Final  . Hemoglobin 02/22/2016 10.3* 12.0 - 15.0 g/dL Final  . HCT 02/22/2016 32.1* 36.0 - 46.0 % Final  . ABO/RH(D) 02/22/2016 O POS   Final  . Antibody Screen 02/22/2016 NEG   Final  . Sample Expiration 02/22/2016 02/25/2016   Final  . ABO/RH(D) 02/22/2016 O POS   Final  . Troponin I 02/22/2016 0.03* <0.03 ng/mL Final   Comment: CRITICAL RESULT CALLED TO, READ BACK BY AND VERIFIED WITH: RN I HABIB AT [id number] MARTINB   . WBC 02/23/2016 9.6  4.0 - 10.5 K/uL Final  . RBC 02/23/2016 3.61* 3.87 - 5.11 MIL/uL Final  . Hemoglobin 02/23/2016 9.8* 12.0 - 15.0 g/dL Final  . HCT 02/23/2016 31.8* 36.0 - 46.0 % Final  . MCV 02/23/2016 88.1  78.0 - 100.0 fL Final  . MCH 02/23/2016 27.1  26.0 - 34.0 pg Final  . MCHC 02/23/2016 30.8  30.0 - 36.0 g/dL Final  . RDW 02/23/2016 14.1  11.5 - 15.5 % Final  . Platelets 02/23/2016 350  150 - 400 K/uL Final  . Sodium 02/23/2016 139  135 - 145 mmol/L Final  . Potassium 02/23/2016 3.4* 3.5 - 5.1 mmol/L Final  . Chloride 02/23/2016 101  101 - 111 mmol/L Final  . CO2 02/23/2016 29  22 - 32 mmol/L Final  . Glucose, Bld 02/23/2016 115* 65 - 99 mg/dL Final  . BUN 02/23/2016 16  6 - 20 mg/dL Final  . Creatinine, Ser 02/23/2016 1.53* 0.44 - 1.00 mg/dL Final    . Calcium 02/23/2016 8.8* 8.9 - 10.3 mg/dL Final  . GFR calc non Af Amer 02/23/2016 37* >60 mL/min Final  . GFR calc Af Amer 02/23/2016 43* >60 mL/min Final   Comment: (NOTE) The eGFR has been calculated using the CKD EPI equation. This calculation has not been validated in all clinical situations. eGFR's persistently <60 mL/min signify possible Chronic Kidney  Disease.   . Anion gap 02/23/2016 9  5 - 15 Final  . WBC 02/24/2016 7.7  4.0 - 10.5 K/uL Final  . RBC 02/24/2016 3.69* 3.87 - 5.11 MIL/uL Final  . Hemoglobin 02/24/2016 10.2* 12.0 - 15.0 g/dL Final  . HCT 02/24/2016 32.8* 36.0 - 46.0 % Final  . MCV 02/24/2016 88.9  78.0 - 100.0 fL Final  . MCH 02/24/2016 27.6  26.0 - 34.0 pg Final  . MCHC 02/24/2016 31.1  30.0 - 36.0 g/dL Final  . RDW 02/24/2016 14.1  11.5 - 15.5 % Final  . Platelets 02/24/2016 350  150 - 400 K/uL Final  . Sodium 02/24/2016 140  135 - 145 mmol/L Final  . Potassium 02/24/2016 3.5  3.5 - 5.1 mmol/L Final  . Chloride 02/24/2016 104  101 - 111 mmol/L Final  . CO2 02/24/2016 27  22 - 32 mmol/L Final  . Glucose, Bld 02/24/2016 105* 65 - 99 mg/dL Final  . BUN 02/24/2016 11  6 - 20 mg/dL Final  . Creatinine, Ser 02/24/2016 1.17* 0.44 - 1.00 mg/dL Final  . Calcium 02/24/2016 8.8* 8.9 - 10.3 mg/dL Final  . GFR calc non Af Amer 02/24/2016 52* >60 mL/min Final  . GFR calc Af Amer 02/24/2016 >60  >60 mL/min Final   Comment: (NOTE) The eGFR has been calculated using the CKD EPI equation. This calculation has not been validated in all clinical situations. eGFR's persistently <60 mL/min signify possible Chronic Kidney Disease.   . Anion gap 02/24/2016 9  5 - 15 Final  . Sodium 02/25/2016 141  135 - 145 mmol/L Final  . Potassium 02/25/2016 3.6  3.5 - 5.1 mmol/L Final  . Chloride 02/25/2016 102  101 - 111 mmol/L Final  . CO2 02/25/2016 30  22 - 32 mmol/L Final  . Glucose, Bld 02/25/2016 105* 65 - 99 mg/dL Final  . BUN 02/25/2016 11  6 - 20 mg/dL Final  . Creatinine,  Ser 02/25/2016 1.11* 0.44 - 1.00 mg/dL Final  . Calcium 02/25/2016 9.0  8.9 - 10.3 mg/dL Final  . GFR calc non Af Amer 02/25/2016 55* >60 mL/min Final  . GFR calc Af Amer 02/25/2016 >60  >60 mL/min Final   Comment: (NOTE) The eGFR has been calculated using the CKD EPI equation. This calculation has not been validated in all clinical situations. eGFR's persistently <60 mL/min signify possible Chronic Kidney Disease.   . Anion gap 02/25/2016 9  5 - 15 Final  Admission on 02/13/2016, Discharged on 02/20/2016  Component Date Value Ref Range Status  . Glucose-Capillary 02/13/2016 140* 65 - 99 mg/dL Final  . Glucose-Capillary 02/13/2016 133* 65 - 99 mg/dL Final  . Comment 1 02/13/2016 Notify RN   Final  . Comment 2 02/13/2016 Document in Chart   Final  . MRSA by PCR 02/13/2016 NEGATIVE  NEGATIVE Final   Comment:        The GeneXpert MRSA Assay (FDA approved for NASAL specimens only), is one component of a comprehensive MRSA colonization surveillance program. It is not intended to diagnose MRSA infection nor to guide or monitor treatment for MRSA infections.   . Color, Urine 02/16/2016 YELLOW  YELLOW Final  . APPearance 02/16/2016 HAZY* CLEAR Final  . Specific Gravity, Urine 02/16/2016 1.015  1.005 - 1.030 Final  . pH 02/16/2016 5.5  5.0 - 8.0 Final  . Glucose, UA 02/16/2016 NEGATIVE  NEGATIVE mg/dL Final  . Hgb urine dipstick 02/16/2016 NEGATIVE  NEGATIVE Final  . Bilirubin Urine 02/16/2016 NEGATIVE  NEGATIVE Final  . Ketones, ur 02/16/2016 NEGATIVE  NEGATIVE mg/dL Final  . Protein, ur 02/16/2016 NEGATIVE  NEGATIVE mg/dL Final  . Nitrite 02/16/2016 NEGATIVE  NEGATIVE Final  . Leukocytes, UA 02/16/2016 NEGATIVE  NEGATIVE Final  . Amylase 02/16/2016 40  28 - 100 U/L Final  . Lipase 02/16/2016 19  11 - 51 U/L Final  . Sodium 02/16/2016 140  135 - 145 mmol/L Final  . Potassium 02/16/2016 3.8  3.5 - 5.1 mmol/L Final  . Chloride 02/16/2016 105  101 - 111 mmol/L Final  . CO2  02/16/2016 27  22 - 32 mmol/L Final  . Glucose, Bld 02/16/2016 136* 65 - 99 mg/dL Final  . BUN 02/16/2016 8  6 - 20 mg/dL Final  . Creatinine, Ser 02/16/2016 1.03* 0.44 - 1.00 mg/dL Final  . Calcium 02/16/2016 9.0  8.9 - 10.3 mg/dL Final  . Total Protein 02/16/2016 6.6  6.5 - 8.1 g/dL Final  . Albumin 02/16/2016 3.0* 3.5 - 5.0 g/dL Final  . AST 02/16/2016 27  15 - 41 U/L Final  . ALT 02/16/2016 31  14 - 54 U/L Final  . Alkaline Phosphatase 02/16/2016 83  38 - 126 U/L Final  . Total Bilirubin 02/16/2016 0.7  0.3 - 1.2 mg/dL Final  . GFR calc non Af Amer 02/16/2016 >60  >60 mL/min Final  . GFR calc Af Amer 02/16/2016 >60  >60 mL/min Final   Comment: (NOTE) The eGFR has been calculated using the CKD EPI equation. This calculation has not been validated in all clinical situations. eGFR's persistently <60 mL/min signify possible Chronic Kidney Disease.   . Anion gap 02/16/2016 8  5 - 15 Final  . WBC 02/16/2016 14.5* 4.0 - 10.5 K/uL Final  . RBC 02/16/2016 3.85* 3.87 - 5.11 MIL/uL Final  . Hemoglobin 02/16/2016 10.8* 12.0 - 15.0 g/dL Final  . HCT 02/16/2016 34.5* 36.0 - 46.0 % Final  . MCV 02/16/2016 89.6  78.0 - 100.0 fL Final  . MCH 02/16/2016 28.1  26.0 - 34.0 pg Final  . MCHC 02/16/2016 31.3  30.0 - 36.0 g/dL Final  . RDW 02/16/2016 14.9  11.5 - 15.5 % Final  . Platelets 02/16/2016 309  150 - 400 K/uL Final  . Glucose-Capillary 02/17/2016 132* 65 - 99 mg/dL Final  . Comment 1 02/17/2016 Notify RN   Final  . Glucose-Capillary 02/18/2016 133* 65 - 99 mg/dL Final  . Comment 1 02/18/2016 Notify RN   Final  . Glucose-Capillary 02/18/2016 120* 65 - 99 mg/dL Final  . Glucose-Capillary 02/18/2016 131* 65 - 99 mg/dL Final  . Comment 1 02/18/2016 Notify RN   Final  . WBC 02/19/2016 9.9  4.0 - 10.5 K/uL Final  . RBC 02/19/2016 3.54* 3.87 - 5.11 MIL/uL Final  . Hemoglobin 02/19/2016 9.7* 12.0 - 15.0 g/dL Final  . HCT 02/19/2016 31.7* 36.0 - 46.0 % Final  . MCV 02/19/2016 89.5  78.0 - 100.0  fL Final  . MCH 02/19/2016 27.4  26.0 - 34.0 pg Final  . MCHC 02/19/2016 30.6  30.0 - 36.0 g/dL Final  . RDW 02/19/2016 14.3  11.5 - 15.5 % Final  . Platelets 02/19/2016 331  150 - 400 K/uL Final  . Glucose-Capillary 02/18/2016 129* 65 - 99 mg/dL Final  . Glucose-Capillary 02/18/2016 97  65 - 99 mg/dL Final  . Glucose-Capillary 02/19/2016 142* 65 - 99 mg/dL Final  . Glucose-Capillary 02/19/2016 112* 65 - 99 mg/dL Final  . Comment 1 02/19/2016 Notify RN  Final  . Glucose-Capillary 02/19/2016 102* 65 - 99 mg/dL Final  . Comment 1 02/19/2016 Notify RN   Final  . Glucose-Capillary 02/19/2016 104* 65 - 99 mg/dL Final  . Glucose-Capillary 02/20/2016 131* 65 - 99 mg/dL Final  . Glucose-Capillary 02/20/2016 128* 65 - 99 mg/dL Final  Hospital Outpatient Visit on 02/05/2016  Component Date Value Ref Range Status  . Glucose-Capillary 02/05/2016 104* 65 - 99 mg/dL Final  . Sodium 02/05/2016 140  135 - 145 mmol/L Final  . Potassium 02/05/2016 3.8  3.5 - 5.1 mmol/L Final  . Chloride 02/05/2016 111  101 - 111 mmol/L Final  . CO2 02/05/2016 21* 22 - 32 mmol/L Final  . Glucose, Bld 02/05/2016 108* 65 - 99 mg/dL Final  . BUN 02/05/2016 14  6 - 20 mg/dL Final  . Creatinine, Ser 02/05/2016 1.17* 0.44 - 1.00 mg/dL Final  . Calcium 02/05/2016 9.4  8.9 - 10.3 mg/dL Final  . GFR calc non Af Amer 02/05/2016 52* >60 mL/min Final  . GFR calc Af Amer 02/05/2016 >60  >60 mL/min Final   Comment: (NOTE) The eGFR has been calculated using the CKD EPI equation. This calculation has not been validated in all clinical situations. eGFR's persistently <60 mL/min signify possible Chronic Kidney Disease.   . Anion gap 02/05/2016 8  5 - 15 Final  . WBC 02/05/2016 7.9  4.0 - 10.5 K/uL Final  . RBC 02/05/2016 4.52  3.87 - 5.11 MIL/uL Final  . Hemoglobin 02/05/2016 12.8  12.0 - 15.0 g/dL Final  . HCT 02/05/2016 38.9  36.0 - 46.0 % Final  . MCV 02/05/2016 86.1  78.0 - 100.0 fL Final  . MCH 02/05/2016 28.3  26.0 - 34.0  pg Final  . MCHC 02/05/2016 32.9  30.0 - 36.0 g/dL Final  . RDW 02/05/2016 14.4  11.5 - 15.5 % Final  . Platelets 02/05/2016 321  150 - 400 K/uL Final  . Hgb A1c MFr Bld 02/06/2016 6.1* 4.8 - 5.6 % Final   Comment: (NOTE)         Pre-diabetes: 5.7 - 6.4         Diabetes: >6.4         Glycemic control for adults with diabetes: <7.0   . Mean Plasma Glucose 02/06/2016 128  mg/dL Final   Comment: (NOTE) Performed At: [facility] [place], [place] [id number] Lindon Romp MD [id number]     Dg Chest 2 View  Result Date: 03/18/2016 CLINICAL DATA:  Shortness of breath, chest pain EXAM: CHEST  2 VIEW COMPARISON:  03/07/2016 FINDINGS: Cardiomediastinal silhouette is unremarkable. Tracheostomy tube is unchanged in position. No pulmonary edema. There is streaky right basilar atelectasis or infiltrate. Bony thorax is stable. IMPRESSION: No pulmonary edema. Streaky right basilar atelectasis or early infiltrate. Electronically Signed   By: Lahoma Crocker M.D.   On: 03/18/2016 21:09   Dg Chest 2 View  Result Date: 03/07/2016 CLINICAL DATA:  Pneumonia EXAM: CHEST  2 VIEW COMPARISON:  03/06/2016 FINDINGS: Tracheostomy to unchanged. Normal cardiac silhouette. Low lung volumes and mild basilar atelectasis. Lungs are clear. IMPRESSION: No acute cardiopulmonary findings. Electronically Signed   By: Suzy Bouchard M.D.   On: 03/07/2016 08:18   Ct Soft Tissue Neck W Contrast  Result Date: 02/22/2016 CLINICAL DATA:  Tracheostomy. Hemoptysis. Tracheostomy placed 1 week ago. Fevers and chills. EXAM: CT NECK WITH CONTRAST TECHNIQUE: Multidetector CT imaging of the neck was performed using the standard protocol following the bolus administration of  intravenous contrast. CONTRAST:  45m ISOVUE-300 IOPAMIDOL (ISOVUE-300) INJECTION 61% COMPARISON:  11/26/2015 FINDINGS: Pharynx and larynx: No mucosal or submucosal lesion visible. Possible vocal cord paresis on the right. Tracheostomy appears  well positioned without evidence of placement site complication. Tracheostomy does not have extensive purchase into the trachea and 1 might consider a longer tube in the future. Salivary glands: Parotid glands are normal. Left submandibular gland is normal. The patient has developed enlargement and indistinctness of the submandibular gland on the right with areas of internal low density and a few microcalcifications. 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 This gland appeared normal 3 months ago. Thyroid: Normal Lymph nodes: No enlarged or low-density nodes identified. Vascular: Negative Limited intracranial: Negative Visualized orbits: Normal Mastoids and visualized paranasal sinuses: Clear Skeleton: No significant finding IMPRESSION: Development of enlargement, indistinctness and low-density affecting the submandibular gland on the right consistent with sialoadenitis. Tracheostomy in place. Note that the tracheostomy does have its tip within the trachea, but only by a cm or so. Consider use of a a longer to in the future. Electronically Signed   By: MNelson ChimesM.D.   On: 02/22/2016 20:08   Ct Chest W Contrast  Result Date: 02/22/2016 CLINICAL DATA:  55year old female with hemoptysis. Tracheostomy placement 4 days ago. Fever, throat pain, shortness of breath and chest pain. Initial encounter. EXAM: CT CHEST WITH CONTRAST TECHNIQUE: Multidetector CT imaging of the chest was performed during intravenous contrast administration. CONTRAST:  71mISOVUE-300 IOPAMIDOL (ISOVUE-300) INJECTION 61% COMPARISON:  Neck CT from today reported separately. Portable chest radiographs 02/21/2016 and earlier. CT Abdomen and Pelvis 04/11/2013. FINDINGS: Tracheostomy tube appears satisfactorily placed. See also neck CT from today reported separately. Large body habitus. Major airways are patent. Mild bilateral lower lobe atelectasis. Mild superimposed lung based mosaic attenuation favored to  reflect gas trapping. No pleural effusion. Cardiomegaly appears stable since 2014. No pericardial effusion. No thoracic lymphadenopathy. Thoracic aorta and central pulmonary artery is are normally enhancing. Negative visualized liver, spleen, pancreas, adrenal glands, left kidney, and bowel in the upper abdomen. No acute osseous abnormality identified. IMPRESSION: 1. Tracheostomy tube appears satisfactorily placed. See also neck CT from today reported separately. 2. No acute pulmonary findings other than mild atelectasis in gas trapping at the lung bases. 3. Stable cardiomegaly. Electronically Signed   By: H Genevie Ann.D.   On: 02/22/2016 20:05   Ct Angio Chest Pe W Or Wo Contrast  Result Date: 03/19/2016 CLINICAL DATA:  Acute onset of generalized chest pain and cough. Initial encounter. EXAM: CT ANGIOGRAPHY CHEST WITH CONTRAST TECHNIQUE: Multidetector CT imaging of the chest was performed using the standard protocol during bolus administration of intravenous contrast. Multiplanar CT image reconstructions and MIPs were obtained to evaluate the vascular anatomy. The study was repeated, as the initial study did not trigger automatic contrast injection. CONTRAST:  161 mL of Isovue 370 IV contrast COMPARISON:  Chest radiograph performed earlier today 8:31 p.m., and CT of the chest performed 02/22/2016 FINDINGS: There is no evidence of pulmonary embolus. Mild bibasilar atelectasis is noted. The lungs are otherwise clear. There is no evidence of significant focal consolidation, pleural effusion or pneumothorax. No masses are identified; no abnormal focal contrast enhancement is seen. The mediastinum is unremarkable in appearance. No mediastinal lymphadenopathy is seen. No pericardial effusion is identified. The great vessels are grossly unremarkable in appearance. The patient's tracheostomy tube is again noted. No axillary lymphadenopathy is seen. The visualized portions of the thyroid gland are unremarkable  in  appearance. The visualized portions of the liver and spleen are unremarkable. The visualized portions of the pancreas, stomach, adrenal glands and kidneys are within normal limits. No acute osseous abnormalities are seen. Review of the MIP images confirms the above findings. IMPRESSION: 1. No evidence of pulmonary embolus. 2. Mild bibasilar atelectasis noted.  Lungs otherwise clear. Electronically Signed   By: Garald Balding M.D.   On: 03/19/2016 01:21   Dg Chest Port 1 View  Result Date: 03/06/2016 CLINICAL DATA:  Productive cough, trach. Hx of DM, HTN,stroke. severly SOB. EXAM: PORTABLE CHEST - 1 VIEW COMPARISON:  02/22/2016 CT and previous studies FINDINGS: Tracheostomy stable. Low lung volumes. Patchy interstitial infiltrates or subsegmental atelectasis in the lung bases slightly increased since previous exam. Heart size and mediastinal contours are within normal limits. No effusion. Visualized bones unremarkable. IMPRESSION: Low volumes with some increase in bibasilar atelectasis or infiltrate. Electronically Signed   By: Lucrezia Europe M.D.   On: 03/06/2016 12:59   Dg Chest Port 1 View  Result Date: 02/21/2016 CLINICAL DATA:  Chest pain EXAM: PORTABLE CHEST 1 VIEW COMPARISON:  02/16/2016 FINDINGS: Cardiac shadow remains enlarged. Tracheostomy tube is noted in satisfactory position. The lungs are well aerated. Stable bibasilar changes are again seen. No new focal infiltrate is noted. IMPRESSION: Stable bibasilar atelectasis/scarring. Electronically Signed   By: Inez Catalina M.D.   On: 02/21/2016 20:01     Assessment/Plan   ICD-9-CM ICD-10-CM   1. 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 Morbid obesity due to excess calories ([facility]) 278.01 E66.01   3. Sleep apnea, unspecified type 780.57 G47.30   4. Essential hypertension 401.9 I10   5. Type 2 diabetes mellitus with stage 3 chronic kidney disease, without long-term current use of insulin ([facility]) 250.40 E11.22    585.3 N18.3   6. Anxiety state  300.00 F41.1   7. Chronic pain syndrome 338.4 G89.4   8. Iron deficiency anemia, unspecified iron deficiency anemia type 280.9 D50.9   9. Fibromyalgia 729.1 M79.7   10. Irritable bowel syndrome, unspecified type 564.1 K58.9   11. Polyneuropathy ([place]) 356.9 G62.9      Patient is being discharged with home health services:  nursing  Patient is being discharged with the following durable medical equipment:  trach collar, trach cleaning kit, suction machine/supplies, cath-n-sleeve 12 French, ambu bag, trach humidification with O2 bleed-in, 4L/min of O2 continuous/stationary and portable O2 via trach collar, disposable inner cannulas and Shirley 4 trach  Patient has been advised to f/u with their PCP in 1-2 weeks to bring them up to date on their rehab stay.  They were provided with a 30 day supply of scripts for prescription medications and refills must be obtained from their PCP.  TIME SPENT (MINUTES): [place]. Perlie Gold  [facility] [address] [place], [place] [address] [phone] Cell (Monday-Friday 8 AM - 5 PM) [phone] After 5 PM and follow prompts

## 2016-04-12 ENCOUNTER — Observation Stay (HOSPITAL_COMMUNITY)
Admission: EM | Admit: 2016-04-12 | Discharge: 2016-04-15 | Disposition: A | Discharge status: Skilled Nursing Facility | Payer: Medicaid Other | Attending: Internal Medicine | Admitting: Internal Medicine

## 2016-04-12 ENCOUNTER — Emergency Department (HOSPITAL_COMMUNITY): Payer: Medicaid Other

## 2016-04-12 ENCOUNTER — Encounter (HOSPITAL_COMMUNITY): Payer: Self-pay | Admitting: *Deleted

## 2016-04-12 DIAGNOSIS — Z6841 Body Mass Index (BMI) 40.0 and over, adult: Secondary | ICD-10-CM | POA: Diagnosis not present

## 2016-04-12 DIAGNOSIS — Z806 Family history of leukemia: Secondary | ICD-10-CM | POA: Insufficient documentation

## 2016-04-12 DIAGNOSIS — G4700 Insomnia, unspecified: Secondary | ICD-10-CM | POA: Insufficient documentation

## 2016-04-12 DIAGNOSIS — R531 Weakness: Secondary | ICD-10-CM | POA: Diagnosis present

## 2016-04-12 DIAGNOSIS — M797 Fibromyalgia: Secondary | ICD-10-CM | POA: Diagnosis not present

## 2016-04-12 DIAGNOSIS — Z8673 Personal history of transient ischemic attack (TIA), and cerebral infarction without residual deficits: Secondary | ICD-10-CM | POA: Diagnosis not present

## 2016-04-12 DIAGNOSIS — I129 Hypertensive chronic kidney disease with stage 1 through stage 4 chronic kidney disease, or unspecified chronic kidney disease: Secondary | ICD-10-CM | POA: Insufficient documentation

## 2016-04-12 DIAGNOSIS — E559 Vitamin D deficiency, unspecified: Secondary | ICD-10-CM | POA: Diagnosis not present

## 2016-04-12 DIAGNOSIS — E785 Hyperlipidemia, unspecified: Secondary | ICD-10-CM | POA: Diagnosis not present

## 2016-04-12 DIAGNOSIS — E872 Acidosis: Secondary | ICD-10-CM | POA: Diagnosis not present

## 2016-04-12 DIAGNOSIS — R262 Difficulty in walking, not elsewhere classified: Secondary | ICD-10-CM

## 2016-04-12 DIAGNOSIS — M5126 Other intervertebral disc displacement, lumbar region: Secondary | ICD-10-CM | POA: Insufficient documentation

## 2016-04-12 DIAGNOSIS — G629 Polyneuropathy, unspecified: Secondary | ICD-10-CM

## 2016-04-12 DIAGNOSIS — E1122 Type 2 diabetes mellitus with diabetic chronic kidney disease: Secondary | ICD-10-CM | POA: Insufficient documentation

## 2016-04-12 DIAGNOSIS — I959 Hypotension, unspecified: Secondary | ICD-10-CM | POA: Diagnosis not present

## 2016-04-12 DIAGNOSIS — R52 Pain, unspecified: Secondary | ICD-10-CM

## 2016-04-12 DIAGNOSIS — N183 Chronic kidney disease, stage 3 (moderate): Secondary | ICD-10-CM | POA: Insufficient documentation

## 2016-04-12 DIAGNOSIS — K219 Gastro-esophageal reflux disease without esophagitis: Secondary | ICD-10-CM | POA: Insufficient documentation

## 2016-04-12 DIAGNOSIS — Z803 Family history of malignant neoplasm of breast: Secondary | ICD-10-CM | POA: Diagnosis not present

## 2016-04-12 DIAGNOSIS — M5137 Other intervertebral disc degeneration, lumbosacral region: Secondary | ICD-10-CM | POA: Insufficient documentation

## 2016-04-12 DIAGNOSIS — E1142 Type 2 diabetes mellitus with diabetic polyneuropathy: Secondary | ICD-10-CM | POA: Diagnosis not present

## 2016-04-12 DIAGNOSIS — R0902 Hypoxemia: Secondary | ICD-10-CM | POA: Insufficient documentation

## 2016-04-12 DIAGNOSIS — Z930 Tracheostomy status: Secondary | ICD-10-CM | POA: Diagnosis not present

## 2016-04-12 DIAGNOSIS — G4733 Obstructive sleep apnea (adult) (pediatric): Secondary | ICD-10-CM | POA: Diagnosis present

## 2016-04-12 DIAGNOSIS — G8929 Other chronic pain: Secondary | ICD-10-CM | POA: Diagnosis present

## 2016-04-12 DIAGNOSIS — Z885 Allergy status to narcotic agent status: Secondary | ICD-10-CM | POA: Insufficient documentation

## 2016-04-12 DIAGNOSIS — Z888 Allergy status to other drugs, medicaments and biological substances status: Secondary | ICD-10-CM | POA: Insufficient documentation

## 2016-04-12 LAB — CBC WITH DIFFERENTIAL/PLATELET
BASOS ABS: 0 10*3/uL (ref 0.0–0.1)
Basophils Relative: 0 %
EOS ABS: 0.2 10*3/uL (ref 0.0–0.7)
EOS PCT: 2 %
HEMATOCRIT: 35.5 % — AB (ref 36.0–46.0)
Hemoglobin: 10.8 g/dL — ABNORMAL LOW (ref 12.0–15.0)
Lymphocytes Relative: 33 %
Lymphs Abs: 2.6 10*3/uL (ref 0.7–4.0)
MCH: 27.6 pg (ref 26.0–34.0)
MCHC: 30.4 g/dL (ref 30.0–36.0)
MCV: 90.8 fL (ref 78.0–100.0)
MONO ABS: 0.5 10*3/uL (ref 0.1–1.0)
MONOS PCT: 7 %
Neutro Abs: 4.6 10*3/uL (ref 1.7–7.7)
Neutrophils Relative %: 58 %
PLATELETS: 319 10*3/uL (ref 150–400)
RBC: 3.91 MIL/uL (ref 3.87–5.11)
RDW: 14.6 % (ref 11.5–15.5)
WBC: 7.9 10*3/uL (ref 4.0–10.5)

## 2016-04-12 LAB — COMPREHENSIVE METABOLIC PANEL
ALBUMIN: 3.2 g/dL — AB (ref 3.5–5.0)
ALK PHOS: 56 U/L (ref 38–126)
ALT: 18 U/L (ref 14–54)
AST: 18 U/L (ref 15–41)
Anion gap: 6 (ref 5–15)
BILIRUBIN TOTAL: 0.5 mg/dL (ref 0.3–1.2)
BUN: 11 mg/dL (ref 6–20)
CALCIUM: 9 mg/dL (ref 8.9–10.3)
CO2: 29 mmol/L (ref 22–32)
CREATININE: 1.18 mg/dL — AB (ref 0.44–1.00)
Chloride: 108 mmol/L (ref 101–111)
GFR calc Af Amer: 59 mL/min — ABNORMAL LOW (ref >60)
GFR, EST NON AFRICAN AMERICAN: 51 mL/min — AB (ref >60)
GLUCOSE: 105 mg/dL — AB (ref 65–99)
POTASSIUM: 3.7 mmol/L (ref 3.5–5.1)
Sodium: 143 mmol/L (ref 135–145)
TOTAL PROTEIN: 6.5 g/dL (ref 6.5–8.1)

## 2016-04-12 LAB — BRAIN NATRIURETIC PEPTIDE: B Natriuretic Peptide: 69 pg/mL (ref 0.0–100.0)

## 2016-04-12 LAB — CK: CK TOTAL: 120 U/L (ref 38–234)

## 2016-04-12 LAB — I-STAT TROPONIN, ED: TROPONIN I, POC: 0 ng/mL (ref 0.00–0.08)

## 2016-04-12 LAB — I-STAT CG4 LACTIC ACID, ED: LACTIC ACID, VENOUS: 0.84 mmol/L (ref 0.5–1.9)

## 2016-04-12 MED ORDER — SODIUM CHLORIDE 0.9 % IV BOLUS (SEPSIS)
1000.0000 mL | Freq: Once | INTRAVENOUS | Status: AC
  Administered 2016-04-12: 1000 mL via INTRAVENOUS

## 2016-04-12 NOTE — ED Provider Notes (Signed)
[facility]-EMERGENCY DEPT Provider Note   CSN: [id number] Arrival date & time: 04/12/16  2111     History   Chief Complaint Chief Complaint  Patient presents with  . Weakness  . Cough    HPI Yesenia [name] is a 55 y.o. female.  The history is provided by the patient and a relative.  Weakness  Primary symptoms include speech change (was slurring in AM, Resolved).  Primary symptoms include no focal weakness. This is a new problem. The current episode started 12 to 24 hours ago. The problem has been rapidly improving. There was no focality noted. Maximum temperature: subjective. The fever has been present for 1 to 2 days. Pertinent negatives include no shortness of breath, no chest pain, no vomiting and no confusion.  Cough  This is a new problem. The current episode started 2 days ago. The cough is productive of sputum. Pertinent negatives include no chest pain, no chills, no ear pain, no sore throat and no shortness of breath.    Past Medical History:  Diagnosis Date  . Anemia   . Anemia   . Chronic pain   . CKD (chronic kidney disease)   . Diabetes mellitus without complication (HCC)    diet controlled. does not check CBG's  . Fibromyalgia    hospitilized 12/16 due to inability to walk  . Hyperlipemia   . Hypertension   . Insomnia   . Neuropathy (HCC)   . Right sided weakness   . Stroke [facility](HCC) 2014?   no residual weakness   . Vitamin deficiency    Vit D    Patient Active Problem List   Diagnosis Date Noted  . HCAP (healthcare-associated pneumonia) 03/06/2016  . Fibromyalgia 02/22/2016  . Cellulitis 02/21/2016  . Abdominal pain 02/16/2016  . Constipation 02/16/2016  . CKD (chronic kidney disease) stage 3, GFR 30-59 ml/min 02/16/2016  . Fever   . Cough   . Sleep apnea 02/13/2016  . Insomnia 08/10/2015  . Polyneuropathy (HCC) 07/10/2015  . GERD (gastroesophageal reflux disease) 07/10/2015  . Acute kidney injury superimposed on chronic kidney disease (HCC) 07/07/2015    . Gait disturbance 07/07/2015  . Morbid obesity (HCC) 07/07/2015  . Left hip pain 07/06/2015  . Chronic pain   . Right sided weakness   . Headache(784.0) 04/14/2013  . Encephalopathy acute 04/11/2013  . Acute pyelonephritis 04/11/2013  . Diabetes mellitus, type 2 (HCC) 04/11/2013  . Dyslipidemia 04/11/2013  . Chest pain 02/15/2013  . CVA (cerebral infarction) 01/14/2013  . HTN (hypertension) 01/14/2013  . Anemia 01/14/2013    Past Surgical History:  Procedure Laterality Date  . KNEE SURGERY  1992, 1995, 1997  . LEG SURGERY Right 2007   Surgery x3 2 arthroscopies and one rod  . OOPHORECTOMY Right 1993?  . TRACHEOSTOMY TUBE PLACEMENT N/A 02/13/2016   Procedure: TRACHEOSTOMY;  Surgeon: Christia Readingwight Bates, MD;  [facility];  Service: ENT;  Laterality: N/A;    OB History    No data available       Home Medications    Prior to Admission medications   Medication Sig Start Date End Date Taking? Authorizing Provider  albuterol (PROVENTIL HFA;VENTOLIN HFA) 108 (90 BASE) MCG/ACT inhaler Inhale 2 puffs into the lungs every 6 (six) hours as needed for wheezing.    Historical Provider, MD  bacitracin ointment Apply 1 application topically 2 (two) times daily. To trach site 02/20/16   Christia Readingwight Bates, MD  benzonatate (TESSALON) 100 MG capsule Take 1 capsule (100 mg total) by  mouth every 8 (eight) hours. 03/19/16   Hannah Muthersbaugh, PA-C  bisacodyl (DULCOLAX) 5 MG EC tablet Take 1 tablet (5 mg total) by mouth daily as needed for moderate constipation. 02/25/16   Maryann Mikhail, DO  busPIRone (BUSPAR) 15 MG tablet Take 15 mg by mouth 2 (two) times daily.    Historical Provider, MD  clonazePAM (KLONOPIN) 0.5 MG tablet Take 0.5 mg by mouth at bedtime.    Historical Provider, MD  dextromethorphan-guaiFENesin (MUCINEX DM) 30-600 MG 12hr tablet Take 1 tablet by mouth 2 (two) times daily. 03/11/16   Rhetta Mura, MD  escitalopram (LEXAPRO) 10 MG tablet Take 10 mg by mouth daily.     Historical  Provider, MD  ferrous sulfate 325 (65 FE) MG tablet Take 325 mg by mouth daily. 09/13/14   Historical Provider, MD  gabapentin (NEURONTIN) 300 MG capsule Take 300 mg by mouth daily as needed. For pain 10/29/15   Historical Provider, MD  glipiZIDE (GLUCOTROL XL) 2.5 MG 24 hr tablet Take 1 tablet (2.5 mg total) by mouth daily with breakfast. 03/11/16   Rhetta Mura, MD  hydrochlorothiazide (HYDRODIURIL) 25 MG tablet Take 0.5 tablets (12.5 mg total) by mouth daily. 03/11/16   Rhetta Mura, MD  HYDROcodone-acetaminophen (NORCO/VICODIN) 5-325 MG tablet Take 1-2 tablets by mouth every 4 (four) hours as needed for moderate pain or severe pain. DO NOT EXCEED 3GM OF APAP IN 24 HOURS FROM ALL SOURCES 03/11/16   Rhetta Mura, MD  hyoscyamine (ANASPAZ) 0.125 MG TBDP disintergrating tablet Place 0.125 mg under the tongue 3 (three) times daily as needed. For Gas-esophageal  Reflux    Historical Provider, MD  ondansetron (ZOFRAN) 4 MG tablet Take 4 mg by mouth daily. Patient states she is taking every day per patient    Historical Provider, MD  pantoprazole (PROTONIX) 40 MG tablet Take 1 tablet (40 mg total) by mouth daily. 02/20/16   Christia Reading, MD  polyethylene glycol [facility] / Ethelene Hal) packet Take 17 g by mouth daily as needed for mild constipation. 02/25/16   Maryann Mikhail, DO  pregabalin (LYRICA) 50 MG capsule Take 1 capsule (50 mg total) by mouth daily. 03/16/16   Sharon Seller, NP  saccharomyces boulardii (FLORASTOR) 250 MG capsule Take 250 mg by mouth 2 (two) times daily.    Historical Provider, MD  traZODone (DESYREL) 150 MG tablet Take 150 mg by mouth at bedtime.    Historical Provider, MD    Family History Family History  Problem Relation Age of Onset  . Breast cancer Mother   . Leukemia Brother   . Breast cancer Maternal Grandmother     Social History Social History  Substance Use Topics  . Smoking status: Never Smoker  . Smokeless tobacco: Never Used  . Alcohol use No       Allergies   Buprenorphine hcl and Morphine and related   Review of Systems Review of Systems  Constitutional: Positive for fever (subjective). Negative for chills.  HENT: Negative for ear pain and sore throat.   Eyes: Negative for pain and visual disturbance.  Respiratory: Positive for cough. Negative for shortness of breath.   Cardiovascular: Negative for chest pain and palpitations.  Gastrointestinal: Positive for diarrhea. Negative for abdominal pain and vomiting.  Genitourinary: Negative for dysuria and hematuria.  Musculoskeletal: Negative for arthralgias and back pain.  Skin: Negative for color change and rash.  Neurological: Positive for speech change (was slurring in AM, Resolved) and weakness. Negative for focal weakness, seizures and syncope.  Psychiatric/Behavioral:  Negative for confusion.  All other systems reviewed and are negative.    Physical Exam Updated Vital Signs BP (!) 87/48   Pulse 70   Temp 98.3 F (36.8 C) (Oral)   Resp 18   Ht 5\' 6"  (1.676 m)   Wt (!) 145.2 kg   SpO2 95%   BMI 51.65 kg/m   Physical Exam  Constitutional: She is oriented to person, place, and time. She appears well-developed and well-nourished. No distress.  HENT:  Head: Normocephalic and atraumatic.  Eyes: Conjunctivae and EOM are normal. Pupils are equal, round, and reactive to light.  Neck: Neck supple.  Cardiovascular: Normal rate and regular rhythm.   Pulmonary/Chest: Effort normal and breath sounds normal. No respiratory distress.  Abdominal: Soft. There is no tenderness.  Musculoskeletal: She exhibits no edema.  Neurological: She is alert and oriented to person, place, and time.  Generalized weakness. 3/5 strength globally.  Unable to transfer from wheelchair to bed without assistance.  Skin: Skin is warm and dry.  Psychiatric: She has a normal mood and affect.  Nursing note and vitals reviewed.    ED Treatments / Results  Labs (all labs ordered are listed,  but only abnormal results are displayed) Labs Reviewed  CBC WITH DIFFERENTIAL/PLATELET - Abnormal; Notable for the following:       Result Value   Hemoglobin 10.8 (*)    HCT 35.5 (*)    All other components within normal limits  CULTURE, BLOOD (ROUTINE X 2)  CULTURE, BLOOD (ROUTINE X 2)  URINE CULTURE  COMPREHENSIVE METABOLIC PANEL  URINALYSIS, ROUTINE W REFLEX MICROSCOPIC (NOT AT [facility])  BRAIN NATRIURETIC PEPTIDE  I-STAT CG4 LACTIC ACID, ED  Rosezena Sensor, ED    EKG  EKG Interpretation  Date/Time:  Monday April 12 2016 22:39:33 EDT Ventricular Rate:  57 PR Interval:    QRS Duration: 102 QT Interval:  485 QTC Calculation: 473 R Axis:   10 Text Interpretation:  Sinus rhythm Abnormal R-wave progression, early transition Left ventricular hypertrophy When compared with ECG of 03/18/2016, No significant change was found Confirmed by [facility]  MD, DAVID ([id number]) on 04/12/2016 11:17:25 PM       Radiology Dg Chest Portable 1 View  Result Date: 04/12/2016 CLINICAL DATA:  Productive cough since September 15. Chest pain and shortness of breath on exertion. Pain radiates to the right arm with numbness and tingling in the right hand. EXAM: PORTABLE CHEST 1 VIEW COMPARISON:  03/18/2016 FINDINGS: Tracheostomy. Shallow inspiration. Mild cardiac enlargement without vascular congestion. No focal airspace disease or consolidation. No blunting of costophrenic angles. No pneumothorax. Tortuous aorta. IMPRESSION: No active disease. Electronically Signed   By: Burman Nieves M.D.   On: 04/12/2016 23:30    Procedures Procedures (including critical care time)  Medications Ordered in ED Medications  sodium chloride 0.9 % bolus 1,000 mL (1,000 mLs Intravenous New Bag/Given 04/12/16 2248)     Initial Impression / Assessment and Plan / ED Course  I have reviewed the triage vital signs and the nursing notes.  Pertinent labs & imaging results that were available during my care of the patient were  reviewed by me and considered in my medical decision making (see chart for details).  Clinical Course   Ms. Yesenia [name] is a 55 year old female with past medical history significant for OSA status post tracheostomy, anemia, CKD, diabetes, fibromyalgia, hypertension, hyperlipidemia, neuropathy, CVA who presents for generalized weakness, cough and was found to be hypotensive.  Patient complains of productive cough with yellow sputum.  No fevers.  EKG obtained, demonstrates sinus rhythm with LVH and early repolarization.  No significant change from prior.  Patient has no focal deficits on neurologic exam.  She does state that she had slurred speech in the morning which has since resolved.  Considering TIA.  He was rotated patient appears to be present in strength testing.  She is keeping 3 out of 5 strength in all 4 extremities, and globalized weakness and cranial nerve exam as well.  On reflex testing, the patient is able to flex her muscles preventing reflexes being obtained.  In addition, patient is outside of the window for TPA.  Given persistent hypotension, 1 L bolus fluid was provided with good improvement blood pressure.  She is conversant and laughing with her companion in the ED.  Upon entering the room she becomes less responsive and speaks in a soft voice.  Patient states she is unable to ambulate, and attempts to stand she requires RN and MD assistance to transfer from wheelchair to bed.  Labs ordered including CBC, urine studies, CMP, BNP, troponin, CK.  Results significant for baseline anemia, baseline creatinine elevation.  Normal white count, normal electrolytes.  Chest x-ray ordered, personally reviewed by me, demonstrates no acute cardiac or pulmonary findings.  Doubt pneumonia.  Given patient's inability to walk an unknown source for generalized weakness and patient is admitted to hospitalist for further workup and evaluation.   Final Clinical Impressions(s) / ED Diagnoses    Final diagnoses:  Generalized weakness    New Prescriptions New Prescriptions   No medications on file     Garey Ham, MD 04/13/16 1406    Dione Booze, MD 04/13/16 [phone]

## 2016-04-12 NOTE — ED Triage Notes (Signed)
Pt c/o fever and generalized weakness x 2 days. Also reports soreness around trach (which ws placed 8/4). Pt has had a productive cough with moderate amounts of yellow sputum. EMS VS 120/68, CBG 110

## 2016-04-13 ENCOUNTER — Observation Stay (HOSPITAL_COMMUNITY): Payer: Medicaid Other

## 2016-04-13 ENCOUNTER — Encounter (HOSPITAL_COMMUNITY): Payer: Self-pay | Admitting: Internal Medicine

## 2016-04-13 DIAGNOSIS — R531 Weakness: Secondary | ICD-10-CM

## 2016-04-13 LAB — I-STAT ARTERIAL BLOOD GAS, ED
Acid-Base Excess: 1 mmol/L (ref 0.0–2.0)
BICARBONATE: 27.8 mmol/L (ref 20.0–28.0)
O2 Saturation: 94 %
Patient temperature: 98
TCO2: 29 mmol/L (ref 0–100)
pCO2 arterial: 55.7 mmHg — ABNORMAL HIGH (ref 32.0–48.0)
pH, Arterial: 7.304 — ABNORMAL LOW (ref 7.350–7.450)
pO2, Arterial: 80 mmHg — ABNORMAL LOW (ref 83.0–108.0)

## 2016-04-13 LAB — URINE MICROSCOPIC-ADD ON

## 2016-04-13 LAB — TSH: TSH: 0.72 u[IU]/mL (ref 0.350–4.500)

## 2016-04-13 LAB — URINALYSIS, ROUTINE W REFLEX MICROSCOPIC
BILIRUBIN URINE: NEGATIVE
Glucose, UA: NEGATIVE mg/dL
KETONES UR: NEGATIVE mg/dL
LEUKOCYTES UA: NEGATIVE
NITRITE: NEGATIVE
Protein, ur: NEGATIVE mg/dL
SPECIFIC GRAVITY, URINE: 1.027 (ref 1.005–1.030)
pH: 5.5 (ref 5.0–8.0)

## 2016-04-13 LAB — CBG MONITORING, ED: Glucose-Capillary: 88 mg/dL (ref 65–99)

## 2016-04-13 LAB — CK: Total CK: 103 U/L (ref 38–234)

## 2016-04-13 LAB — GLUCOSE, CAPILLARY
GLUCOSE-CAPILLARY: 88 mg/dL (ref 65–99)
GLUCOSE-CAPILLARY: 114 mg/dL — AB (ref 65–99)

## 2016-04-13 MED ORDER — DM-GUAIFENESIN ER 30-600 MG PO TB12
1.0000 | ORAL_TABLET | Freq: Two times a day (BID) | ORAL | Status: DC
  Administered 2016-04-13 – 2016-04-15 (×4): 1 via ORAL
  Filled 2016-04-13 (×4): qty 1

## 2016-04-13 MED ORDER — ONDANSETRON HCL 4 MG PO TABS
4.0000 mg | ORAL_TABLET | Freq: Every day | ORAL | Status: DC
  Administered 2016-04-13 – 2016-04-15 (×3): 4 mg via ORAL
  Filled 2016-04-13 (×3): qty 1

## 2016-04-13 MED ORDER — PREGABALIN 50 MG PO CAPS
50.0000 mg | ORAL_CAPSULE | Freq: Every day | ORAL | Status: DC
  Administered 2016-04-13 – 2016-04-15 (×3): 50 mg via ORAL
  Filled 2016-04-13: qty 1
  Filled 2016-04-13: qty 2
  Filled 2016-04-13: qty 1

## 2016-04-13 MED ORDER — PANTOPRAZOLE SODIUM 40 MG PO TBEC: 40.0000 mg | DELAYED_RELEASE_TABLET | Freq: Every day | ORAL | Status: DC

## 2016-04-13 MED ORDER — SODIUM CHLORIDE 0.9 % IV SOLN
INTRAVENOUS | Status: AC
  Administered 2016-04-13 – 2016-04-14 (×3): via INTRAVENOUS

## 2016-04-13 MED ORDER — FERROUS SULFATE 325 (65 FE) MG PO TABS
325.0000 mg | ORAL_TABLET | Freq: Every day | ORAL | Status: DC
  Administered 2016-04-14 – 2016-04-15 (×2): 325 mg via ORAL
  Filled 2016-04-13 (×3): qty 1

## 2016-04-13 MED ORDER — HYOSCYAMINE SULFATE 0.125 MG PO TBDP
0.1250 mg | ORAL_TABLET | Freq: Three times a day (TID) | ORAL | Status: DC | PRN
  Filled 2016-04-13: qty 1

## 2016-04-13 MED ORDER — ALBUTEROL SULFATE (2.5 MG/3ML) 0.083% IN NEBU: 3.0000 mL | INHALATION_SOLUTION | Freq: Four times a day (QID) | RESPIRATORY_TRACT | Status: DC | PRN

## 2016-04-13 MED ORDER — HYDROCHLOROTHIAZIDE 25 MG PO TABS
12.5000 mg | ORAL_TABLET | Freq: Every day | ORAL | Status: DC
  Administered 2016-04-15: 12.5 mg via ORAL
  Filled 2016-04-13 (×3): qty 1

## 2016-04-13 MED ORDER — ESCITALOPRAM OXALATE 10 MG PO TABS
10.0000 mg | ORAL_TABLET | Freq: Every day | ORAL | Status: DC
  Administered 2016-04-13 – 2016-04-15 (×3): 10 mg via ORAL
  Filled 2016-04-13 (×3): qty 1

## 2016-04-13 MED ORDER — POLYETHYLENE GLYCOL 3350 17 G PO PACK: 17.0000 g | PACK | Freq: Every day | ORAL | Status: DC | PRN

## 2016-04-13 MED ORDER — HYDROCODONE-ACETAMINOPHEN 5-325 MG PO TABS
1.0000 | ORAL_TABLET | ORAL | Status: DC | PRN
Start: 2016-04-13 — End: 2016-04-13

## 2016-04-13 MED ORDER — BUSPIRONE HCL 15 MG PO TABS
15.0000 mg | ORAL_TABLET | Freq: Two times a day (BID) | ORAL | Status: DC
  Administered 2016-04-13 – 2016-04-15 (×4): 15 mg via ORAL
  Filled 2016-04-13: qty 3
  Filled 2016-04-13 (×2): qty 1
  Filled 2016-04-13: qty 1.5
  Filled 2016-04-13: qty 1
  Filled 2016-04-13: qty 1.5

## 2016-04-13 MED ORDER — GABAPENTIN 300 MG PO CAPS: 300.0000 mg | ORAL_CAPSULE | Freq: Every day | ORAL | Status: DC | PRN

## 2016-04-13 MED ORDER — GLIPIZIDE ER 2.5 MG PO TB24
2.5000 mg | ORAL_TABLET | Freq: Every day | ORAL | Status: DC
Start: 2016-04-14 — End: 2016-04-14
  Filled 2016-04-13: qty 1

## 2016-04-13 MED ORDER — SACCHAROMYCES BOULARDII 250 MG PO CAPS
250.0000 mg | ORAL_CAPSULE | Freq: Two times a day (BID) | ORAL | Status: DC
  Administered 2016-04-13 – 2016-04-15 (×4): 250 mg via ORAL
  Filled 2016-04-13 (×4): qty 1

## 2016-04-13 MED ORDER — INSULIN ASPART 100 UNIT/ML ~~LOC~~ SOLN: 0.0000 [IU] | Freq: Three times a day (TID) | SUBCUTANEOUS | Status: DC

## 2016-04-13 MED ORDER — CLONAZEPAM 0.5 MG PO TABS
0.5000 mg | ORAL_TABLET | Freq: Every day | ORAL | Status: DC
  Administered 2016-04-13 – 2016-04-14 (×2): 0.5 mg via ORAL
  Filled 2016-04-13 (×2): qty 1

## 2016-04-13 MED ORDER — TRAZODONE HCL 50 MG PO TABS
150.0000 mg | ORAL_TABLET | Freq: Every day | ORAL | Status: DC
  Administered 2016-04-13 – 2016-04-14 (×2): 150 mg via ORAL
  Filled 2016-04-13 (×2): qty 1

## 2016-04-13 MED ORDER — TRAMADOL HCL 50 MG PO TABS
50.0000 mg | ORAL_TABLET | Freq: Four times a day (QID) | ORAL | Status: DC
  Administered 2016-04-13 – 2016-04-15 (×8): 50 mg via ORAL
  Filled 2016-04-13 (×8): qty 1

## 2016-04-13 MED ORDER — BISACODYL 5 MG PO TBEC: 5.0000 mg | DELAYED_RELEASE_TABLET | Freq: Every day | ORAL | Status: DC | PRN

## 2016-04-13 NOTE — Progress Notes (Signed)
Patient arrived on unit, call bell within reach and oriented to the unit. CCMD notified.  Yesenia HarrisonSamantha K Twana Wileman, RN

## 2016-04-13 NOTE — ED Notes (Signed)
Attempted In and Out Cath x2.  Unsuccessful.  Patient placed on bedpan.

## 2016-04-13 NOTE — ED Notes (Signed)
Pt off to MRI

## 2016-04-13 NOTE — Progress Notes (Signed)
[facility].

## 2016-04-13 NOTE — Progress Notes (Signed)
RT placed pt on ATC 35% while she is sleeping due to desat at 88% on room air. Sat improved to 94%.

## 2016-04-13 NOTE — Progress Notes (Signed)
Patient requesting PRN pain medication. Provider notified. Casper HarrisonSamantha K Slayden Mennenga, RN

## 2016-04-13 NOTE — Progress Notes (Signed)
12:00 trach check not done due to pt not available, in MRI.

## 2016-04-13 NOTE — H&P (Signed)
History and Physical    Kysa Calais ZOX:[id number] DOB: 1961-01-13 DOA: 04/12/2016  PCP: Reather Converse, PA-C  Patient coming from: Home.  Chief Complaint: Weakness.  HPI: Yesenia [name] is a 55 y.o. female with history of sleep apnea on trach collar, diabetes mellitus type 2 recently started on antidiabetic medications, hypertension and chronic kidney disease presents to the ER because of increasing weakness since yesterday morning. Patient states that since waking up yesterday morning patient is feeling very weak and unable to ambulate and patient states she is more weak on the right side with pain. Denies falling or hurting herself. Mildly hypotensive on arrival which improved with IV fluids. Patient is being admitted for further management for weakness. She denies any nausea vomiting or diarrhea. Denies any chest pain or shortness of breath.  ED Course: 2 L of fluid bolus was given. Unable to get UA. Chest x-ray does not show any infiltrates.  Review of Systems: As per HPI, rest all negative.   Past Medical History:  Diagnosis Date  . Anemia   . Anemia   . Chronic pain   . CKD (chronic kidney disease)   . Diabetes mellitus without complication (HCC)    diet controlled. does not check CBG's  . Fibromyalgia    hospitilized 12/16 due to inability to walk  . Hyperlipemia   . Hypertension   . Insomnia   . Neuropathy (HCC)   . Right sided weakness   . Stroke [facility]) 2014?   no residual weakness   . Vitamin deficiency    Vit D    Past Surgical History:  Procedure Laterality Date  . KNEE SURGERY  1992, 1995, 1997  . LEG SURGERY Right 2007   Surgery x3 2 arthroscopies and one rod  . OOPHORECTOMY Right 1993?  . TRACHEOSTOMY TUBE PLACEMENT N/A 02/13/2016   Procedure: TRACHEOSTOMY;  Surgeon: Christia Reading, MD;  Location: [facility] OR;  Service: ENT;  Laterality: N/A;     reports that she has never smoked. She has never used smokeless tobacco. She reports that she does not drink alcohol  or use drugs.  Allergies  Allergen Reactions  . Buprenorphine Hcl Hives  . Morphine And Related Hives and Dermatitis    Family History  Problem Relation Age of Onset  . Breast cancer Mother   . Leukemia Brother   . Breast cancer Maternal Grandmother     Prior to Admission medications   Medication Sig Start Date End Date Taking? Authorizing Provider  albuterol (PROVENTIL HFA;VENTOLIN HFA) 108 (90 BASE) MCG/ACT inhaler Inhale 2 puffs into the lungs every 6 (six) hours as needed for wheezing.    Historical Provider, MD  bacitracin ointment Apply 1 application topically 2 (two) times daily. To trach site 02/20/16   Christia Reading, MD  benzonatate (TESSALON) 100 MG capsule Take 1 capsule (100 mg total) by mouth every 8 (eight) hours. 03/19/16   Hannah Muthersbaugh, PA-C  bisacodyl (DULCOLAX) 5 MG EC tablet Take 1 tablet (5 mg total) by mouth daily as needed for moderate constipation. 02/25/16   Maryann Mikhail, DO  busPIRone (BUSPAR) 15 MG tablet Take 15 mg by mouth 2 (two) times daily.    Historical Provider, MD  clonazePAM (KLONOPIN) 0.5 MG tablet Take 0.5 mg by mouth at bedtime.    Historical Provider, MD  dextromethorphan-guaiFENesin (MUCINEX DM) 30-600 MG 12hr tablet Take 1 tablet by mouth 2 (two) times daily. 03/11/16   Rhetta Mura, MD  escitalopram (LEXAPRO) 10 MG tablet Take 10 mg by mouth  daily.     Historical Provider, MD  ferrous sulfate 325 (65 FE) MG tablet Take 325 mg by mouth daily. 09/13/14   Historical Provider, MD  gabapentin (NEURONTIN) 300 MG capsule Take 300 mg by mouth daily as needed. For pain 10/29/15   Historical Provider, MD  glipiZIDE (GLUCOTROL XL) 2.5 MG 24 hr tablet Take 1 tablet (2.5 mg total) by mouth daily with breakfast. 03/11/16   Rhetta MuraJai-Gurmukh Samtani, MD  hydrochlorothiazide (HYDRODIURIL) 25 MG tablet Take 0.5 tablets (12.5 mg total) by mouth daily. 03/11/16   Rhetta MuraJai-Gurmukh Samtani, MD  HYDROcodone-acetaminophen (NORCO/VICODIN) 5-325 MG tablet Take 1-2 tablets by  mouth every 4 (four) hours as needed for moderate pain or severe pain. DO NOT EXCEED 3GM OF APAP IN 24 HOURS FROM ALL SOURCES 03/11/16   Rhetta MuraJai-Gurmukh Samtani, MD  hyoscyamine (ANASPAZ) 0.125 MG TBDP disintergrating tablet Place 0.125 mg under the tongue 3 (three) times daily as needed. For Gas-esophageal  Reflux    Historical Provider, MD  ondansetron (ZOFRAN) 4 MG tablet Take 4 mg by mouth daily. Patient states she is taking every day per patient    Historical Provider, MD  pantoprazole (PROTONIX) 40 MG tablet Take 1 tablet (40 mg total) by mouth daily. 02/20/16   Christia Readingwight Bates, MD  polyethylene glycol [facility](MIRALAX / Ethelene HalGLYCOLAX) packet Take 17 g by mouth daily as needed for mild constipation. 02/25/16   Maryann Mikhail, DO  pregabalin (LYRICA) 50 MG capsule Take 1 capsule (50 mg total) by mouth daily. 03/16/16   Sharon SellerJessica K Eubanks, NP  saccharomyces boulardii (FLORASTOR) 250 MG capsule Take 250 mg by mouth 2 (two) times daily.    Historical Provider, MD  traZODone (DESYREL) 150 MG tablet Take 150 mg by mouth at bedtime.    Historical Provider, MD    Physical Exam: Vitals:   04/13/16 0348 04/13/16 0400 04/13/16 0430 04/13/16 0500  BP: 124/89 128/88 117/83 112/98  Pulse: 63 60 (!) 54 (!) 55  Resp: 18 16 17 16   Temp:      TempSrc:      SpO2: 94% 93% 99% 97%  Weight:      Height:          Constitutional: Morbidly obese not in distress. Vitals:   04/13/16 0348 04/13/16 0400 04/13/16 0430 04/13/16 0500  BP: 124/89 128/88 117/83 112/98  Pulse: 63 60 (!) 54 (!) 55  Resp: 18 16 17 16   Temp:      TempSrc:      SpO2: 94% 93% 99% 97%  Weight:      Height:       Eyes: Anicteric no pallor. ENMT: No discharge from the ears eyes nose or mouth. Neck: Trach collar seen. No mass felt. Respiratory: No rhonchi or crepitations. Cardiovascular: S1-S2 heard. Abdomen: Soft nontender bowel sounds present. No guarding or rigidity Musculoskeletal: No edema. Skin: No rash. Skin appears warm. Neurologic: Moves all  extremities but mildly weak generally. No facial asymmetry. PERRLA positive. Psychiatric: Alert awake oriented to time place and person. Normal affect.   Labs on Admission: I have personally reviewed following labs and imaging studies  CBC:  Recent Labs Lab 04/12/16 2140  WBC 7.9  NEUTROABS 4.6  HGB 10.8*  HCT 35.5*  MCV 90.8  PLT 319   Basic Metabolic Panel:  Recent Labs Lab 04/12/16 2140  NA 143  K 3.7  CL 108  CO2 29  GLUCOSE 105*  BUN 11  CREATININE 1.18*  CALCIUM 9.0   GFR: Estimated Creatinine Clearance: 80.6 mL/min (  by C-G formula based on SCr of 1.18 mg/dL (H)). Liver Function Tests:  Recent Labs Lab 04/12/16 2140  AST 18  ALT 18  ALKPHOS 56  BILITOT 0.5  PROT 6.5  ALBUMIN 3.2*   No results for input(s): LIPASE, AMYLASE in the last 168 hours. No results for input(s): AMMONIA in the last 168 hours. Coagulation Profile: No results for input(s): INR, PROTIME in the last 168 hours. Cardiac Enzymes:  Recent Labs Lab 04/12/16 2250  CKTOTAL 120   BNP (last 3 results) No results for input(s): PROBNP in the last 8760 hours. HbA1C: No results for input(s): HGBA1C in the last 72 hours. CBG: No results for input(s): GLUCAP in the last 168 hours. Lipid Profile: No results for input(s): CHOL, HDL, LDLCALC, TRIG, CHOLHDL, LDLDIRECT in the last 72 hours. Thyroid Function Tests: No results for input(s): TSH, T4TOTAL, FREET4, T3FREE, THYROIDAB in the last 72 hours. Anemia Panel: No results for input(s): VITAMINB12, FOLATE, FERRITIN, TIBC, IRON, RETICCTPCT in the last 72 hours. Urine analysis:    Component Value Date/Time   COLORURINE YELLOW 02/16/2016 1310   APPEARANCEUR HAZY (A) 02/16/2016 1310   LABSPEC 1.015 02/16/2016 1310   PHURINE 5.5 02/16/2016 1310   GLUCOSEU NEGATIVE 02/16/2016 1310   HGBUR NEGATIVE 02/16/2016 1310   BILIRUBINUR NEGATIVE 02/16/2016 1310   KETONESUR NEGATIVE 02/16/2016 1310   PROTEINUR NEGATIVE 02/16/2016 1310    UROBILINOGEN 0.2 06/16/2014 0955   NITRITE NEGATIVE 02/16/2016 1310   LEUKOCYTESUR NEGATIVE 02/16/2016 1310   Sepsis Labs: @LABRCNTIP (procalcitonin:4,lacticidven:4) )No results found for this or any previous visit (from the past 240 hour(s)).   Radiological Exams on Admission: Dg Chest Portable 1 View  Result Date: 04/12/2016 CLINICAL DATA:  Productive cough since September 15. Chest pain and shortness of breath on exertion. Pain radiates to the right arm with numbness and tingling in the right hand. EXAM: PORTABLE CHEST 1 VIEW COMPARISON:  03/18/2016 FINDINGS: Tracheostomy. Shallow inspiration. Mild cardiac enlargement without vascular congestion. No focal airspace disease or consolidation. No blunting of costophrenic angles. No pneumothorax. Tortuous aorta. IMPRESSION: No active disease. Electronically Signed   By: Burman Nieves M.D.   On: 04/12/2016 23:30     Assessment/Plan Principal Problem:   Generalized weakness Active Problems:   Diabetes mellitus, type 2 (HCC)   Chronic pain   Polyneuropathy (HCC)   Sleep apnea   Weakness generalized    1. Generalized weakness - patient is generally weak but complains of more weakness on the right side. Denies any fall or trauma. Denies any incontinence of urine. Will get MRI of the brain and neck. Check TSH and CK levels get physical therapy consult. Check ABG. Cause not clear. 2. Diabetes mellitus type 2 - patient was just recently started on antidiabetic medications. Closely follow CBGs with sliding scale coverage. 3. Sleep apnea on trach collar. 4. Hypertension on hydrochlorothiazide. 5. Chronic anemia - follow CBC. 6. Chronic kidney disease stage III - creatinine appears to be at baseline.   DVT prophylaxis: Lovenox. Code Status: Full code.  Family Communication: Discussed with patient.  Disposition Plan: To be determined.  Consults called: Physical therapy.  Admission status: Observation.    Eduard Clos MD [facility] Pager [phone].  If 7PM-7AM, please contact night-coverage [email or web address] Password [email or web address]  04/13/2016, 5:35 AM

## 2016-04-13 NOTE — Progress Notes (Signed)
PROGRESS NOTE  Yesenia [name]  NWG:[id number] DOB: [birth year] DOA: 04/12/2016 PCP: Karna Dupes Outpatient Specialists:  Subjective: Patient mentioned bilateral lower extremity pain. Denies any fever or chills.  Brief Narrative:  Yesenia [name] is a 55 y.o. female with history of sleep apnea on trach collar, diabetes mellitus type 2 recently started on antidiabetic medications, hypertension and chronic kidney disease presents to the ER because of increasing weakness since yesterday morning. Patient states that since waking up yesterday morning patient is feeling very weak and unable to ambulate and patient states she is more weak on the right side with pain. Denies falling or hurting herself. Mildly hypotensive on arrival which improved with IV fluids. Patient is being admitted for further management for weakness. She denies any nausea vomiting or diarrhea. Denies any chest pain or shortness of breath.  Assessment & Plan:   Principal Problem:   Generalized weakness Active Problems:   Diabetes mellitus, type 2 (HCC)   Chronic pain   Polyneuropathy (HCC)   Sleep apnea   Weakness generalized   This is a no charge note, patient seen and evaluated today by my colleague Have seen the patient and examined her, patient had CKD. She came in with weakness. ? UTI, start on Rocephin.  1. Generalized weakness - patient is generally weak but complains of more weakness on the right side. Denies any fall or trauma. Denies any incontinence of urine. Will get MRI of the brain and neck. Check TSH and CK levels get physical therapy consult. Check ABG. Cause not clear. 2. Diabetes mellitus type 2 - patient was just recently started on antidiabetic medications. Closely follow CBGs with sliding scale coverage. 3. Sleep apnea on trach collar. 4. Hypertension on hydrochlorothiazide. 5. Chronic anemia - follow CBC. 6. Chronic kidney disease stage III - creatinine appears to be at baseline.   DVT  prophylaxis:  Code Status: Full Code Family Communication:  Disposition Plan:  Diet:    Consultants:   None  Procedures:   None  Antimicrobials:   none  Objective: Vitals:   04/13/16 0600 04/13/16 0630 04/13/16 0826 04/13/16 0831  BP: 124/79 113/66 118/78   Pulse: (!) 57 62 (!) 59 (!) 57  Resp: 16 22 18 17   Temp:      TempSrc:      SpO2: 99% 97% 92% 94%  Weight:      Height:        Intake/Output Summary (Last 24 hours) at 04/13/16 1244 Last data filed at 04/13/16 1215  Gross per 24 hour  Intake             1000 ml  Output               25 ml  Net              975 ml   Filed Weights   04/12/16 2116  Weight: (!) 145.2 kg (320 lb)    Examination: General exam: Appears calm and comfortable  Respiratory system: Clear to auscultation. Respiratory effort normal. Cardiovascular system: S1 & S2 heard, RRR. No JVD, murmurs, rubs, gallops or clicks. No pedal edema. Gastrointestinal system: Abdomen is nondistended, soft and nontender. No organomegaly or masses felt. Normal bowel sounds heard. Central nervous system: Alert and oriented. No focal neurological deficits. Extremities: Symmetric 5 x 5 power. Skin: No rashes, lesions or ulcers Psychiatry: Judgement and insight appear normal. Mood & affect appropriate.   Data Reviewed: I have personally reviewed following labs and imaging studies  CBC:  Recent Labs Lab 04/12/16 2140  WBC 7.9  NEUTROABS 4.6  HGB 10.8*  HCT 35.5*  MCV 90.8  PLT 319   Basic Metabolic Panel:  Recent Labs Lab 04/12/16 2140  NA 143  K 3.7  CL 108  CO2 29  GLUCOSE 105*  BUN 11  CREATININE 1.18*  CALCIUM 9.0   GFR: Estimated Creatinine Clearance: 80.6 mL/min (by C-G formula based on SCr of 1.18 mg/dL (H)). Liver Function Tests:  Recent Labs Lab 04/12/16 2140  AST 18  ALT 18  ALKPHOS 56  BILITOT 0.5  PROT 6.5  ALBUMIN 3.2*   No results for input(s): LIPASE, AMYLASE in the last 168 hours. No results for input(s):  AMMONIA in the last 168 hours. Coagulation Profile: No results for input(s): INR, PROTIME in the last 168 hours. Cardiac Enzymes:  Recent Labs Lab 04/12/16 2250 04/13/16 0657  CKTOTAL 120 103   BNP (last 3 results) No results for input(s): PROBNP in the last 8760 hours. HbA1C: No results for input(s): HGBA1C in the last 72 hours. CBG:  Recent Labs Lab 04/13/16 1217  GLUCAP 88   Lipid Profile: No results for input(s): CHOL, HDL, LDLCALC, TRIG, CHOLHDL, LDLDIRECT in the last 72 hours. Thyroid Function Tests:  Recent Labs  04/13/16 0657  TSH 0.720   Anemia Panel: No results for input(s): VITAMINB12, FOLATE, FERRITIN, TIBC, IRON, RETICCTPCT in the last 72 hours. Urine analysis:    Component Value Date/Time   COLORURINE YELLOW 04/13/2016 0701   APPEARANCEUR HAZY (A) 04/13/2016 0701   LABSPEC 1.027 04/13/2016 0701   PHURINE 5.5 04/13/2016 0701   GLUCOSEU NEGATIVE 04/13/2016 0701   HGBUR MODERATE (A) 04/13/2016 0701   BILIRUBINUR NEGATIVE 04/13/2016 0701   KETONESUR NEGATIVE 04/13/2016 0701   PROTEINUR NEGATIVE 04/13/2016 0701   UROBILINOGEN 0.2 06/16/2014 0955   NITRITE NEGATIVE 04/13/2016 0701   LEUKOCYTESUR NEGATIVE 04/13/2016 0701   Sepsis Labs: @LABRCNTIP (procalcitonin:4,lacticidven:4)  )No results found for this or any previous visit (from the past 240 hour(s)).   Invalid input(s): PROCALCITONIN, LACTICACIDVEN   Radiology Studies: Dg Pelvis Portable  Result Date: 04/13/2016 CLINICAL DATA:  Patient woke up yesterday with pain all over. No known injury. EXAM: PORTABLE PELVIS 1-2 VIEWS COMPARISON:  CT pelvis 07/06/2015 FINDINGS: There is no evidence of pelvic fracture or diastasis. No pelvic bone lesions are seen. IMPRESSION: Negative. Electronically Signed   By: Burman Nieves M.D.   On: 04/13/2016 05:54   Dg Chest Portable 1 View  Result Date: 04/12/2016 CLINICAL DATA:  Productive cough since September 15. Chest pain and shortness of breath on exertion.  Pain radiates to the right arm with numbness and tingling in the right hand. EXAM: PORTABLE CHEST 1 VIEW COMPARISON:  03/18/2016 FINDINGS: Tracheostomy. Shallow inspiration. Mild cardiac enlargement without vascular congestion. No focal airspace disease or consolidation. No blunting of costophrenic angles. No pneumothorax. Tortuous aorta. IMPRESSION: No active disease. Electronically Signed   By: Burman Nieves M.D.   On: 04/12/2016 23:30        Scheduled Meds: . busPIRone  15 mg Oral BID  . clonazePAM  0.5 mg Oral QHS  . dextromethorphan-guaiFENesin  1 tablet Oral BID  . escitalopram  10 mg Oral Daily  . ferrous sulfate  325 mg Oral Daily  . glipiZIDE  2.5 mg Oral Q breakfast  . hydrochlorothiazide  12.5 mg Oral Daily  . insulin aspart  0-9 Units Subcutaneous TID WC  . ondansetron  4 mg Oral Daily  . pantoprazole  40 mg Oral  Daily  . pregabalin  50 mg Oral Daily  . saccharomyces boulardii  250 mg Oral BID  . traZODone  150 mg Oral QHS   Continuous Infusions: . sodium chloride 75 mL/hr at 04/13/16 0530     LOS: 0 days    Time spent: 35 minutes    Chai Verdejo A, MD [facility] Pager [phone]  If 7PM-7AM, please contact night-coverage [email or web address] Password [email or web address] 04/13/2016, 12:44 PM

## 2016-04-14 DIAGNOSIS — G4730 Sleep apnea, unspecified: Secondary | ICD-10-CM

## 2016-04-14 DIAGNOSIS — G894 Chronic pain syndrome: Secondary | ICD-10-CM

## 2016-04-14 DIAGNOSIS — J9612 Chronic respiratory failure with hypercapnia: Secondary | ICD-10-CM | POA: Diagnosis not present

## 2016-04-14 DIAGNOSIS — R531 Weakness: Secondary | ICD-10-CM | POA: Diagnosis not present

## 2016-04-14 DIAGNOSIS — Z930 Tracheostomy status: Secondary | ICD-10-CM | POA: Diagnosis not present

## 2016-04-14 LAB — CBC
HEMATOCRIT: 33.8 % — AB (ref 36.0–46.0)
Hemoglobin: 10.4 g/dL — ABNORMAL LOW (ref 12.0–15.0)
MCH: 27.9 pg (ref 26.0–34.0)
MCHC: 30.8 g/dL (ref 30.0–36.0)
MCV: 90.6 fL (ref 78.0–100.0)
Platelets: 283 10*3/uL (ref 150–400)
RBC: 3.73 MIL/uL — ABNORMAL LOW (ref 3.87–5.11)
RDW: 14.5 % (ref 11.5–15.5)
WBC: 7.2 10*3/uL (ref 4.0–10.5)

## 2016-04-14 LAB — BASIC METABOLIC PANEL
Anion gap: 5 (ref 5–15)
BUN: 12 mg/dL (ref 6–20)
CHLORIDE: 109 mmol/L (ref 101–111)
CO2: 28 mmol/L (ref 22–32)
Calcium: 8.5 mg/dL — ABNORMAL LOW (ref 8.9–10.3)
Creatinine, Ser: 0.96 mg/dL (ref 0.44–1.00)
GFR calc Af Amer: >60 mL/min (ref >60)
GFR calc non Af Amer: >60 mL/min (ref >60)
Glucose, Bld: 86 mg/dL (ref 65–99)
POTASSIUM: 3.8 mmol/L (ref 3.5–5.1)
SODIUM: 142 mmol/L (ref 135–145)

## 2016-04-14 LAB — URINE CULTURE

## 2016-04-14 LAB — GLUCOSE, CAPILLARY
GLUCOSE-CAPILLARY: 110 mg/dL — AB (ref 65–99)
GLUCOSE-CAPILLARY: 90 mg/dL (ref 65–99)
Glucose-Capillary: 87 mg/dL (ref 65–99)
Glucose-Capillary: 110 mg/dL — ABNORMAL HIGH (ref 65–99)

## 2016-04-14 MED ORDER — ONDANSETRON HCL 4 MG/2ML IJ SOLN
4.0000 mg | Freq: Four times a day (QID) | INTRAMUSCULAR | Status: DC | PRN
  Administered 2016-04-14: 4 mg via INTRAVENOUS
  Filled 2016-04-14: qty 2

## 2016-04-14 NOTE — Evaluation (Signed)
Physical Therapy Evaluation Patient Details Name: Yesenia [name] [place] MRN: [id number] DOB: Nov 26, 1960 Today's Date: 04/14/2016   History of Present Illness  55 y.o. female with history of sleep apnea on trach collar, diabetes mellitus type 2 recently started on antidiabetic medications, hypertension and chronic kidney disease presents to the ER because of increasing weakness and R sided pain. MRI of brain on 10/3 negative for acute intracranial abnormality. MRI of lumbar spine showed L4-5, L5-S1 disc bulges.   Clinical Impression  Pt admitted with above diagnosis. Pt currently with functional limitations due to the deficits listed below (see PT Problem List). Pt will benefit from skilled PT to increase their independence and safety with mobility to allow discharge to the venue listed below.  Pt with limited mobility at time of evaluation.  Due to her lack of mobility and having 12 steps to enter her apartment am recommending SNF at this time.       Follow Up Recommendations SNF    Equipment Recommendations  None recommended by PT    Recommendations for Other Services       Precautions / Restrictions Precautions Precautions: Fall Restrictions Weight Bearing Restrictions: No      Mobility  Bed Mobility Overal bed mobility: Needs Assistance Bed Mobility: Supine to Sit     Supine to sit: Min assist;HOB elevated     General bed mobility comments: Min assist for trunk elevation and scooting hips to EOB with increased time. HOB elevated with use of bed rail.  Transfers Overall transfer level: Needs assistance Equipment used: Rolling walker (2 wheeled) Transfers: Sit to/from UGI CorporationStand;Stand Pivot Transfers Sit to Stand: Mod assist;+2 physical assistance Stand pivot transfers: Min assist;+2 physical assistance       General transfer comment: Assist to boost up from EOB. VCs for hand placement and sequencing stand pivot transfer. Pt needing cues to get R LE underneath her COG, wanted to  keep out to the R outside of the RW  Ambulation/Gait             General Gait Details: Pt refused gait attempts  Stairs            Wheelchair Mobility    Modified Rankin (Stroke Patients Only)       Balance Overall balance assessment: Needs assistance Sitting-balance support: Feet supported;Bilateral upper extremity supported Sitting balance-Leahy Scale: Good     Standing balance support: Bilateral upper extremity supported Standing balance-Leahy Scale: Poor                               Pertinent Vitals/Pain Pain Assessment: 0-10 Pain Score: 9  Pain Location: R LE (Pt laying towards R side) Pain Descriptors / Indicators: Shooting Pain Intervention(s): Monitored during session;Repositioned    Home Living Family/patient expects to be discharged to:: Private residence Living Arrangements: Alone Available Help at Discharge: Family;Available PRN/intermittently Type of Home: Apartment Home Access: Stairs to enter   Entrance Stairs-Number of Steps: 2-3+12 Home Layout: One level Home Equipment: Walker - 2 wheels;Grab bars - tub/shower;Wheelchair - manual;Shower seat;Bedside commode      Prior Function Level of Independence: Independent               Hand Dominance   Dominant Hand: Left    Extremity/Trunk Assessment   Upper Extremity Assessment: Defer to OT evaluation RUE Deficits / Details: pt reports pain and weakness in RUE but able to use functionally.  Lower Extremity Assessment: Overall WFL for tasks assessed;RLE deficits/detail RLE Deficits / Details: Pt able to move R LE slowly with mobility, but c/o pain and weakness       Communication   Communication: Tracheostomy;No difficulties  Cognition Arousal/Alertness: Awake/alert Behavior During Therapy: Flat affect Overall Cognitive Status: Within Functional Limits for tasks assessed                      General Comments General comments (skin integrity,  edema, etc.): Pt needing cues to place PMV on to be able to speak with therapists    Exercises     Assessment/Plan    PT Assessment Patient needs continued PT services  PT Problem List Decreased strength;Decreased range of motion;Decreased activity tolerance;Decreased balance;Decreased mobility;Decreased knowledge of use of DME          PT Treatment Interventions DME instruction;Gait training;Stair training;Functional mobility training;Therapeutic activities;Therapeutic exercise;Balance training    PT Goals (Current goals can be found in the Care Plan section)  Acute Rehab PT Goals Patient Stated Goal: none stated PT Goal Formulation: With patient Time For Goal Achievement: 04/21/16 Potential to Achieve Goals: Fair    Frequency Min 3X/week   Barriers to discharge Decreased caregiver support;Inaccessible home environment      Co-evaluation PT/OT/SLP Co-Evaluation/Treatment: Yes Reason for Co-Treatment: For patient/therapist safety PT goals addressed during session: Mobility/safety with mobility;Proper use of DME OT goals addressed during session: ADL's and self-care       End of Session Equipment Utilized During Treatment: Gait belt Activity Tolerance: Patient limited by pain Patient left: in chair;with call bell/phone within reach;with chair alarm set Nurse Communication: Mobility status    Functional Assessment Tool Used: clinical judgement and objective findings Functional Limitation: Changing and maintaining body position Changing and Maintaining Body Position Current Status (Z6109): At least 20 percent but less than 40 percent impaired, limited or restricted Changing and Maintaining Body Position Goal Status (U0454): At least 1 percent but less than 20 percent impaired, limited or restricted    Time: 0920-0943 PT Time Calculation (min) (ACUTE ONLY): 23 min   Charges:   PT Evaluation $PT Eval Moderate Complexity: 1 Procedure     PT G Codes:   PT G-Codes  **NOT FOR INPATIENT CLASS** Functional Assessment Tool Used: clinical judgement and objective findings Functional Limitation: Changing and maintaining body position Changing and Maintaining Body Position Current Status (U9811): At least 20 percent but less than 40 percent impaired, limited or restricted Changing and Maintaining Body Position Goal Status (B1478): At least 1 percent but less than 20 percent impaired, limited or restricted    [facility] LUBECK 04/14/2016, 11:10 AM

## 2016-04-14 NOTE — Care Management Note (Addendum)
Case Management Note  Patient Details  Name: Yesenia StageSonja Huck MRN: [id number] Date of Birth: 01/01/1961  Subjective/Objective:        Presents with generalized weakness,history of sleep apnea / trach , diabetes mellitus type 2 , hypertension and chronic kidney disease.Recently d/c from [place] from to home. Lives alone.   Freeman CaldronCatrina Howell (Daughter)     [phone]        PCP:  Reather Converse'laf [place]       Action/Plan: Per PT's evaluation/ recommendation: SNF. CSW is aware. CM to f/u with disposition needs.   Expected Discharge Date:                  Expected Discharge Plan:  Skilled Nursing Facility  In-House Referral:  Clinical Social Work  Discharge planning Services  CM Consult  Post Acute Care Choice:    Choice offered to:  Patient/ choice list provided to pt if  Home health services are needed by CM.  DME Arranged:    DME Agency:     HH Arranged:    HH Agency:     Status of Service:  In process, will continue to follow  If discussed at Long Length of Stay Meetings, dates discussed:    Additional Comments:  Epifanio LeschesCole, Sheri Gatchel Hudson, RN 04/14/2016, 2:21 PM

## 2016-04-14 NOTE — Evaluation (Signed)
Occupational Therapy Evaluation Patient Details Name: Yesenia [name] MRN: [id number] DOB: October 27, 1960 Today's Date: 04/14/2016    History of Present Illness 55 y.o. female with history of sleep apnea on trach collar, diabetes mellitus type 2 recently started on antidiabetic medications, hypertension and chronic kidney disease presents to the ER because of increasing weakness and R sided pain. MRI of brain on 10/3 negative for acute intracranial abnormality. MRI of lumbar spine showed L4-5, L5-S1 disc bulges.    Clinical Impression   Pt reports she was independent with ADL PTA. Currently pt overall mod assist +2 for basic transfers, min guard for UB ADL in sitting, and mod assist for LB ADL. Pt presenting with R sided pain/weakness and decreased balance in standing impacting her independence and safety with ADL and functional mobility. Recommending SNF for follow up to maximize independence and safety with ADL and functional mobility prior to return home. Pt would benefit from continued skilled OT to address established goals.    Follow Up Recommendations  SNF;Supervision/Assistance - 24 hour    Equipment Recommendations  None recommended by OT    Recommendations for Other Services       Precautions / Restrictions Precautions Precautions: Fall Restrictions Weight Bearing Restrictions: No      Mobility Bed Mobility Overal bed mobility: Needs Assistance Bed Mobility: Supine to Sit     Supine to sit: Min assist;HOB elevated     General bed mobility comments: Min assist for trunk elevation and scooting hips to EOB with increased time. HOB elevated with use of bed rail.  Transfers Overall transfer level: Needs assistance Equipment used: Rolling walker (2 wheeled) Transfers: Sit to/from [employer] Sit to Stand: Mod assist;+2 physical assistance Stand pivot transfers: Min assist;+2 physical assistance       General transfer comment: Assist to boost up from  EOB. VCs for hand placement and sequencing stand pivot transfer.    Balance Overall balance assessment: Needs assistance Sitting-balance support: Feet supported;Bilateral upper extremity supported Sitting balance-Leahy Scale: Good     Standing balance support: Bilateral upper extremity supported Standing balance-Leahy Scale: Poor                              ADL Overall ADL's : Needs assistance/impaired Eating/Feeding: Set up;Sitting   Grooming: Set up;Min guard;Sitting   Upper Body Bathing: Set up;Min guard;Sitting   Lower Body Bathing: Moderate assistance;Sit to/from stand;+2 for safety/equipment   Upper Body Dressing : Set up;Min guard;Sitting   Lower Body Dressing: Moderate assistance;Sit to/from stand;+2 for safety/equipment   Toilet Transfer: Moderate assistance;+2 for physical assistance;Stand-pivot;BSC;RW Toilet Transfer Details (indicate cue type and reason): Simulated by sit to stand from EOB with stand pivot to chair. Mod assist +2 for sit to stand, min assist +2 for stand pivot.         Functional mobility during ADLs: Minimal assistance;+2 for physical assistance;Rolling walker (for stand pivot only) General ADL Comments: Pt requires encouragement to participate in activities. Reports R side of body hurts (9/10) but pt noted to be laying in bed on R side upon therapy arrival.     Vision Vision Assessment?: No apparent visual deficits   Perception     Praxis      Pertinent Vitals/Pain Pain Assessment: 0-10 Pain Score: 9  Pain Descriptors / Indicators: Shooting Pain Intervention(s): Monitored during session     Hand Dominance Left   Extremity/Trunk Assessment Upper Extremity Assessment Upper Extremity Assessment: RUE  deficits/detail;Overall [facility] LLCWFL for tasks assessed RUE Deficits / Details: pt reports pain and weakness in RUE but able to use functionally.   Lower Extremity Assessment Lower Extremity Assessment: Defer to PT evaluation        Communication Communication Communication: Tracheostomy;No difficulties   Cognition Arousal/Alertness: Awake/alert Behavior During Therapy: Flat affect Overall Cognitive Status: Within Functional Limits for tasks assessed                     General Comments       Exercises       Shoulder Instructions      Home Living Family/patient expects to be discharged to:: Private residence Living Arrangements: Alone Available Help at Discharge: Family;Available PRN/intermittently Type of Home: Apartment Home Access: Stairs to enter Enter[employer] CorporationEntrance Stairs-Number of Steps: 2-3+12   Home Layout: One level     Bathroom Shower/Tub: Producer, television/film/videoWalk-in shower   Bathroom Toilet: Standard     Home Equipment: Environmental consultantWalker - 2 wheels;Grab bars - tub/shower;Wheelchair - manual;Shower seat;Bedside commode          Prior Functioning/Environment Level of Independence: Independent                 OT Problem List: Decreased strength;Decreased activity tolerance;Impaired balance (sitting and/or standing);Decreased knowledge of use of DME or AE;Decreased knowledge of precautions;Obesity;Pain   OT Treatment/Interventions: Self-care/ADL training;Therapeutic exercise;Energy conservation;DME and/or AE instruction;Therapeutic activities;Patient/family education;Balance training    OT Goals(Current goals can be found in the care plan section) Acute Rehab OT Goals Patient Stated Goal: none stated OT Goal Formulation: With patient Time For Goal Achievement: 04/28/16 Potential to Achieve Goals: Good ADL Goals Pt Will Perform Upper Body Bathing: with supervision;sitting Pt Will Perform Lower Body Bathing: with supervision;sit to/from stand Pt Will Transfer to Toilet: with supervision;ambulating;bedside commode Pt Will Perform Toileting - Clothing Manipulation and hygiene: with supervision;sit to/from stand Pt/caregiver will Perform Home Exercise Program: Increased strength;Both right and left upper  extremity;With theraband;With written HEP provided  OT Frequency: Min 2X/week   Barriers to D/C: Inaccessible home environment;Decreased caregiver support  apartment on 2nd floor, lives alone       Co-evaluation PT/OT/SLP Co-Evaluation/Treatment: Yes Reason for Co-Treatment: For patient/therapist safety   OT goals addressed during session: ADL's and self-care      End of Session Equipment Utilized During Treatment: Gait belt;Rolling walker Nurse Communication: Mobility status Manufacturing engineer(RN tech)  Activity Tolerance: Patient limited by pain;Patient tolerated treatment well Patient left: in chair;with call bell/phone within reach;with chair alarm set   Time: [phone] OT Time Calculation (min): 23 min Charges:  OT General Charges $OT Visit: 1 Procedure OT Evaluation $OT Eval Moderate Complexity: 1 Procedure G-Codes: OT G-codes **NOT FOR INPATIENT CLASS** Functional Assessment Tool Used: Clinical judgement Functional Limitation: Self care Self Care Current Status (V4098(G8987): At least 40 percent but less than 60 percent impaired, limited or restricted Self Care Goal Status (J1914(G8988): At least 1 percent but less than 20 percent impaired, limited or restricted   Gaye AlkenBailey A Shaketa Serafin M.S., OTR/L Pager: [phone]  04/14/2016, 10:27 AM

## 2016-04-14 NOTE — [place] FL2 (Signed)
[place] MEDICAID FL2 LEVEL OF CARE SCREENING TOOL     IDENTIFICATION  Patient Name: Yesenia StageSonja Akhter Birthdate: [birth year] Sex: female Admission Date (Current Location): 04/12/2016  [facility] [place]   Facility and Address:  [facility] [place], [address], [place], [place] [address]      Provider Number: [id number]  Attending Physician Name and Address:  Hollice EspySendil K Krishnan, MD  Relative Name and Phone Number:       Current Level of Care: SNF Recommended Level of Care: Skilled Nursing Facility Prior Approval Number:    Date Approved/Denied:   PASRR Number: [id number]  Discharge Plan: SNF    Current Diagnoses: Patient Active Problem List   Diagnosis Date Noted  . Generalized weakness 04/13/2016  . Weakness generalized 04/13/2016  . HCAP (healthcare-associated pneumonia) 03/06/2016  . Fibromyalgia 02/22/2016  . Cellulitis 02/21/2016  . Abdominal pain 02/16/2016  . Constipation 02/16/2016  . CKD (chronic kidney disease) [name] 3, GFR 30-59 ml/min 02/16/2016  . Fever   . Cough   . Sleep apnea 02/13/2016  . Insomnia 08/10/2015  . Polyneuropathy (HCC) 07/10/2015  . GERD (gastroesophageal reflux disease) 07/10/2015  . Acute kidney injury superimposed on chronic kidney disease (HCC) 07/07/2015  . Gait disturbance 07/07/2015  . Morbid obesity (HCC) 07/07/2015  . Left hip pain 07/06/2015  . Chronic pain   . Right sided weakness   . Headache(784.0) 04/14/2013  . Encephalopathy acute 04/11/2013  . Acute pyelonephritis 04/11/2013  . Diabetes mellitus, type 2 (HCC) 04/11/2013  . Dyslipidemia 04/11/2013  . Chest pain 02/15/2013  . CVA (cerebral infarction) 01/14/2013  . HTN (hypertension) 01/14/2013  . Anemia 01/14/2013    Orientation RESPIRATION BLADDER Height & Weight     Self, Time, Situation, Place  Tracheostomy- on trach collar at night, room air during the day Continent Weight: (!) 332 lb 9.6 oz (150.9 kg) Height:  5\' 6"   (167.6 cm)  BEHAVIORAL SYMPTOMS/MOOD NEUROLOGICAL BOWEL NUTRITION STATUS      Continent Diet  AMBULATORY STATUS COMMUNICATION OF NEEDS Skin   Extensive Assist Verbally Normal                       Personal Care Assistance Level of Assistance  Bathing, Dressing Bathing Assistance: Maximum assistance   Dressing Assistance: Maximum assistance     Functional Limitations Info             SPECIAL CARE FACTORS FREQUENCY  PT (By licensed PT), OT (By licensed OT)     PT Frequency: 5/wk OT Frequency: 5/wk            Contractures      Additional Factors Info  Code Status, Allergies, Psychotropic, Insulin Sliding Scale Code Status Info: FULL Allergies Info: Buprenorphine Hcl, Morphine And Related Psychotropic Info: klonopin         Current Medications (04/14/2016):  This is the current hospital active medication list Current Facility-Administered Medications  Medication Dose Route Frequency Provider Last Rate Last Dose  . albuterol (PROVENTIL) (2.5 MG/3ML) 0.083% nebulizer solution 3 mL  3 mL Inhalation Q6H PRN Eduard ClosArshad N Kakrakandy, MD      . bisacodyl (DULCOLAX) EC tablet 5 mg  5 mg Oral Daily PRN Eduard ClosArshad N Kakrakandy, MD      . busPIRone (BUSPAR) tablet 15 mg  15 mg Oral BID Eduard ClosArshad N Kakrakandy, MD   15 mg at 04/14/16 0841  . clonazePAM (KLONOPIN) tablet 0.5 mg  0.5 mg Oral QHS  Eduard Clos, MD   0.5 mg at 04/13/16 2117  . dextromethorphan-guaiFENesin (MUCINEX DM) 30-600 MG per 12 hr tablet 1 tablet  1 tablet Oral BID Eduard Clos, MD   1 tablet at 04/14/16 [phone]  . escitalopram (LEXAPRO) tablet 10 mg  10 mg Oral Daily Eduard Clos, MD   10 mg at 04/14/16 0840  . ferrous sulfate tablet 325 mg  325 mg Oral Daily Eduard Clos, MD   325 mg at 04/14/16 0841  . hydrochlorothiazide (HYDRODIURIL) tablet 12.5 mg  12.5 mg Oral Daily Eduard Clos, MD      . hyoscyamine (ANASPAZ) disintergrating tablet 0.125 mg  0.125 mg Sublingual TID PRN Eduard Clos, MD      . insulin aspart (novoLOG) injection 0-9 Units  0-9 Units Subcutaneous TID WC Eduard Clos, MD   Stopped at 04/14/16 0800  . ondansetron (ZOFRAN) injection 4 mg  4 mg Intravenous Q6H PRN Hollice Espy, MD      . ondansetron [facility]) tablet 4 mg  4 mg Oral Daily Eduard Clos, MD   4 mg at 04/14/16 0841  . polyethylene glycol (MIRALAX / GLYCOLAX) packet 17 g  17 g Oral Daily PRN Eduard Clos, MD      . pregabalin (LYRICA) capsule 50 mg  50 mg Oral Daily Eduard Clos, MD   50 mg at 04/14/16 0841  . saccharomyces boulardii (FLORASTOR) capsule 250 mg  250 mg Oral BID Eduard Clos, MD   250 mg at 04/14/16 0839  . traMADol (ULTRAM) tablet 50 mg  50 mg Oral Q6H Clydia Llano, MD   50 mg at 04/14/16 1256  . traZODone (DESYREL) tablet 150 mg  150 mg Oral QHS Eduard Clos, MD   150 mg at 04/13/16 2117     Discharge Medications: Please see discharge summary for a list of discharge medications.  Relevant Imaging Results:  Relevant Lab Results:   Additional Information SS#: [id number]  Burna Sis, LCSW

## 2016-04-14 NOTE — Clinical Social Work Note (Signed)
Clinical Social Work Assessment  Patient Details  Name: Yesenia [name] MRN: [id number] Date of Birth: [birth year]  Date of referral:  04/14/16               Reason for consult:  Facility Placement                Permission sought to share information with:  Facility Industrial/product designerContact Representative Permission granted to share information::  Yes, Verbal Permission Granted  Name::        Agency::  SNFs  Relationship::     Contact Information:     Housing/Transportation Living arrangements for the past 2 months:  Skilled Nursing Facility, Single Family Home Source of Information:  Patient Patient Interpreter Needed:  None Criminal Activity/Legal Involvement Pertinent to Current Situation/Hospitalization:  No - Comment as needed Significant Relationships:  Adult Children Lives with:  Self Do you feel safe going back to the place where you live?  No Need for family participation in patient care:  Yes (Comment) (dtr assists with things at home)  Care giving concerns:  Pt lives at home alone and gets some assistance with ADLs from dtr   Social Worker assessment / plan:  Spoke with pt about PT recommendation for SNF.  [facility] in august and she has been home for a few weeks now.   Employment status:    Insurance information:  [place] Recommendations:  Skilled Nursing Facility Information / Referral to community resources:  Skilled Nursing Facility  Patient/Family's Response to care:  Pt understands she would be unable to return home alone with current mobility but is hopeful her dtr will agree to take her home.  Patient/Family's Understanding of and Emotional Response to Diagnosis, Current Treatment, and Prognosis:  No questions or concerns- pt hopeful her dtr will decide to care for her at home.  Emotional Assessment Appearance:  Appears stated age Attitude/Demeanor/Rapport:    Affect (typically observed):   Appropriate Orientation:  Oriented to Situation, Oriented to  Time, Oriented to Place, Oriented to Self Alcohol / Substance use:  Not Applicable Psych involvement (Current and /or in the community):  No (Comment)  Discharge Needs  Concerns to be addressed:  Care Coordination Readmission within the last 30 days:  Yes Current discharge risk:  Physical Impairment Barriers to Discharge:  Continued Medical Work up   Yesenia [name], Yesenia Leiker H, LCSW 04/14/2016, 5:27 PM

## 2016-04-14 NOTE — Progress Notes (Signed)
PROGRESS NOTE  Yesenia [name] ZOX:[id number] DOB: Mar 29, 1961 DOA: 04/12/2016 PCP: Karna Dupes  HPI/Recap of past 64 hours: 55 year old female past oral history of sleep apnea on trach collar along with diabetes mellitus and chronic kidney disease came to the emergency room on early morning of 10/3 complaining of increased weakness 1 day. Patient stated that she herself felt more weak on the right side with some pain. Denied any accidents or injuries. Laryngeal she knows noted to be borderline hypotensive but denies any chest pain or shortness of breath.  Patient's lab work on admission was unrevealing. MRI done of the head noted no signs of acute CVA. An ABG done noted chronic with her acidosis with mild elevation in PCO2 and chronic hypoxia. Infection was also ruled out. Patient seen by physical and occupational therapy who recommended skilled nursing  Assessment/Plan: Principal Problem:   Generalized weakness: Patient will go to skilled nursing on 10/5 Active Problems:   Diabetes mellitus, type 2 (HCC): CBGs have remained stable   Chronic pain: Continue home medications   Polyneuropathy (HCC)   Sleep apnea: Continue nightly trach mask    Code Status: Full code   Family Communication: Left message with daughter   Disposition Plan: Skilled nursing tomorrow    Consultants:  None   Procedures:  None   Antimicrobials:  None   DVT prophylaxis:  SCDs   Objective: Vitals:   04/14/16 0850 04/14/16 1211 04/14/16 1241 04/14/16 1528  BP:   136/87   Pulse: 73 69 73 78  Resp: Temp:   98 F (36.7 C)   TempSrc:   Oral   SpO2:  97%  98%  Weight:      Height:        Intake/Output Summary (Last 24 hours) at 04/14/16 1922 Last data filed at 04/14/16 1313  Gross per 24 hour  Intake           1392.5 ml  Output              400 ml  Net            992.5 ml   Filed Weights   04/12/16 2116 04/13/16 1536  Weight: (!) 145.2 kg (320 lb) (!) 150.9 kg  (332 lb 9.6 oz)    Exam:   General:  Alert and oriented 3, no acute distress   Cardiovascular: Regular rate and rhythm, S1-S2   Respiratory: Decreased breath sounds throughout secondary to body habitus   Abdomen: Soft, nontender, nondistended, positive bowel sounds   Musculoskeletal: No clubbing or cyanosis or edema   Skin: No skin breaks, tears or lesions  Psychiatry: Patient is appropriate, no evidence of psychoses  Neuro: Patient's upper extremities with grip, flexion and extension, she deathly has signs of global weakness, but not on one side despite her subjective complaints    Data Reviewed: CBC:  Recent Labs Lab 04/12/16 2140 04/14/16 0438  WBC 7.9 7.2  NEUTROABS 4.6  --   HGB 10.8* 10.4*  HCT 35.5* 33.8*  MCV 90.8 90.6  PLT 319 283   Basic Metabolic Panel:  Recent Labs Lab 04/12/16 2140 04/14/16 0438  NA 143 142  K 3.7 3.8  CL 108 109  CO2 29 28  GLUCOSE 105* 86  BUN 11 12  CREATININE 1.18* 0.96  CALCIUM 9.0 8.5*   GFR: Estimated Creatinine Clearance: 101.4 mL/min (by C-G formula based on SCr of 0.96 mg/dL). Liver Function Tests:  Recent Labs Lab 04/12/16 2140  AST 18  ALT 18  ALKPHOS 56  BILITOT 0.5  PROT 6.5  ALBUMIN 3.2*   No results for input(s): LIPASE, AMYLASE in the last 168 hours. No results for input(s): AMMONIA in the last 168 hours. Coagulation Profile: No results for input(s): INR, PROTIME in the last 168 hours. Cardiac Enzymes:  Recent Labs Lab 04/12/16 2250 04/13/16 0657  CKTOTAL 120 103   BNP (last 3 results) No results for input(s): PROBNP in the last 8760 hours. HbA1C: No results for input(s): HGBA1C in the last 72 hours. CBG:  Recent Labs Lab 04/13/16 1714 04/13/16 2204 04/14/16 0806 04/14/16 1154 04/14/16 1659  GLUCAP 88 114* 87 110* 110*   Lipid Profile: No results for input(s): CHOL, HDL, LDLCALC, TRIG, CHOLHDL, LDLDIRECT in the last 72 hours. Thyroid Function Tests:  Recent Labs   04/13/16 0657  TSH 0.720   Anemia Panel: No results for input(s): VITAMINB12, FOLATE, FERRITIN, TIBC, IRON, RETICCTPCT in the last 72 hours. Urine analysis:    Component Value Date/Time   COLORURINE YELLOW 04/13/2016 0701   APPEARANCEUR HAZY (A) 04/13/2016 0701   LABSPEC 1.027 04/13/2016 0701   PHURINE 5.5 04/13/2016 0701   GLUCOSEU NEGATIVE 04/13/2016 0701   HGBUR MODERATE (A) 04/13/2016 0701   BILIRUBINUR NEGATIVE 04/13/2016 0701   KETONESUR NEGATIVE 04/13/2016 0701   PROTEINUR NEGATIVE 04/13/2016 0701   UROBILINOGEN 0.2 06/16/2014 0955   NITRITE NEGATIVE 04/13/2016 0701   LEUKOCYTESUR NEGATIVE 04/13/2016 0701   Sepsis Labs: @LABRCNTIP (procalcitonin:4,lacticidven:4)  ) Recent Results (from the past 240 hour(s))  Culture, blood (Routine x 2)     Status: None (Preliminary result)   Collection Time: 04/12/16  9:35 PM  Result Value Ref Range Status   Specimen Description BLOOD LEFT ARM  Final   Special Requests BOTTLES DRAWN AEROBIC ONLY 5CC  Final   Culture NO GROWTH 2 DAYS  Final   Report Status PENDING  Incomplete  Culture, blood (Routine x 2)     Status: None (Preliminary result)   Collection Time: 04/12/16  9:40 PM  Result Value Ref Range Status   Specimen Description BLOOD LEFT HAND  Final   Special Requests BOTTLES DRAWN AEROBIC ONLY 5CC  Final   Culture NO GROWTH 2 DAYS  Final   Report Status PENDING  Incomplete  Urine culture     Status: Abnormal   Collection Time: 04/13/16  7:01 AM  Result Value Ref Range Status   Specimen Description URINE, RANDOM  Final   Special Requests NONE  Final   Culture MULTIPLE SPECIES PRESENT, SUGGEST RECOLLECTION (A)  Final   Report Status 04/14/2016 FINAL  Final      Studies: No results found.  Scheduled Meds: . busPIRone  15 mg Oral BID  . clonazePAM  0.5 mg Oral QHS  . dextromethorphan-guaiFENesin  1 tablet Oral BID  . escitalopram  10 mg Oral Daily  . ferrous sulfate  325 mg Oral Daily  . hydrochlorothiazide  12.5 mg  Oral Daily  . insulin aspart  0-9 Units Subcutaneous TID WC  . ondansetron  4 mg Oral Daily  . pregabalin  50 mg Oral Daily  . saccharomyces boulardii  250 mg Oral BID  . traMADol  50 mg Oral Q6H  . traZODone  150 mg Oral QHS    Continuous Infusions:    LOS: 0 days    Hollice Espy, MD [facility] Pager [phone]  If 7PM-7AM, please contact night-coverage [email or web address] Password [email or web address] 04/14/2016, 7:22 PM

## 2016-04-15 DIAGNOSIS — Z930 Tracheostomy status: Secondary | ICD-10-CM

## 2016-04-15 DIAGNOSIS — G894 Chronic pain syndrome: Secondary | ICD-10-CM | POA: Diagnosis not present

## 2016-04-15 DIAGNOSIS — G4730 Sleep apnea, unspecified: Secondary | ICD-10-CM | POA: Diagnosis not present

## 2016-04-15 DIAGNOSIS — R531 Weakness: Secondary | ICD-10-CM | POA: Diagnosis not present

## 2016-04-15 LAB — GLUCOSE, CAPILLARY
GLUCOSE-CAPILLARY: 95 mg/dL (ref 65–99)
Glucose-Capillary: 119 mg/dL — ABNORMAL HIGH (ref 65–99)

## 2016-04-15 MED ORDER — PREGABALIN 50 MG PO CAPS: 50.0000 mg | ORAL_CAPSULE | Freq: Every day | ORAL | 0 refills | Status: DC

## 2016-04-15 MED ORDER — CLONAZEPAM 1 MG PO TABS: 0.5000 mg | ORAL_TABLET | Freq: Every day | ORAL | 0 refills | Status: DC

## 2016-04-15 NOTE — Clinical Social Work Placement (Signed)
   CLINICAL SOCIAL WORK PLACEMENT  NOTE  Date:  04/15/2016  Patient Details  Name: Yesenia StageSonja Hane MRN: [id number] Date of Birth: 1961/03/19  Clinical Social Work is seeking post-discharge placement for this patient at the Skilled  Nursing Facility level of care (*CSW will initial, date and re-position this form in  chart as items are completed):  Yes   Patient/family provided with [place] Clinical Social Work Department's list of facilities offering this level of care within the geographic area requested by the patient (or if unable, by the patient's family).  Yes   Patient/family informed of their freedom to choose among providers that offer the needed level of care, that participate in Medicare, Medicaid or managed care program needed by the patient, have an available bed and are willing to accept the patient.  Yes   Patient/family informed of [facility]'s ownership interest in [facility] and [facility], as well as of the fact that they are under no obligation to receive care at these facilities.  PASRR submitted to EDS on       PASRR number received on       Existing PASRR number confirmed on 04/14/16     FL2 transmitted to all facilities in geographic area requested by pt/family on 04/14/16     FL2 transmitted to all facilities within larger geographic area on       Patient informed that his/her managed care company has contracts with or will negotiate with certain facilities, including the following:        Yes   Patient/family informed of bed offers received.  [facility]     Physician recommends and patient chooses bed at      [facility]16.  Patient to be transferred to facility by By car     Patient family notified on 04/15/16 of transfer.  Name of family member notified:  Pt notified daughter     PHYSICIAN Please sign FL2, Please prepare priority  discharge summary, including medications     Additional Comment:    _______________________________________________ Mearl LatinNadia S Kashden Deboy, LCSWA 04/15/2016, 2:47 PM

## 2016-04-15 NOTE — Progress Notes (Signed)
When checking patient she felt she needed more flow into trach collar that she could not feel it. Patient ATC is hooked to air, she is receiving 21% but the flow was increased to 10LPM from 5 LPM for patient comfort. She is tolerating well and is resting comfortably. RT will continue to monitor patient. Vitals stable. HR 67 and SPO2 97%. No distress noted.

## 2016-04-15 NOTE — Progress Notes (Signed)
[facility], transported by family. Discharge instructions sent to [facility] by MSW. New RX's given to patient and instructed to give to facility verbalized understanding.  Natori Gudino, Kae HellerMiranda Lynn, RN

## 2016-04-15 NOTE — Progress Notes (Signed)
Patient will DC to: [facility] [phone]) Anticipated DC date: 04/15/16 Family notified: Pt alerted daughter Transport by: Daughter will transport by car   Per MD patient ready for DC to . RN, patient, patient's family, and facility notified of DC. Discharge Summary sent to facility. RN given number for report. DC packet on chart. Ambulance transport requested for patient. *CSW paged MD for signed hard scripts for Klonopin and Lyrica.  CSW signing off.  Cristobal GoldmannNadia Dolan Xia, [place] Clinical Social Worker [phone]

## 2016-04-15 NOTE — Discharge Summary (Signed)
Discharge Summary  Yesenia [name] YNW:[id number] DOB: 1960/08/18  PCP: Karna Dupes  Admit date: 04/12/2016 Discharge date: 04/15/2016  Time spent: 25 minutes   Recommendations for Outpatient Follow-up:  1. Patient being discharged to short-term skilled nursing   Discharge Diagnoses:  Active Hospital Problems   Diagnosis Date Noted  . Generalized weakness 04/13/2016  . Weakness generalized 04/13/2016  . Sleep apnea 02/13/2016  . Polyneuropathy (HCC) 07/10/2015  . Chronic pain   . Diabetes mellitus, type 2 (HCC) 04/11/2013    Resolved Hospital Problems   Diagnosis Date Noted Date Resolved  No resolved problems to display.    Discharge Condition: Stable, being discharged to skilled nursing   Diet recommendation: Carb modified   Vitals:   04/15/16 0604 04/15/16 1131  BP: 125/69   Pulse: 76 72  Resp: 18 16  Temp: 97.5 F (36.4 C)     History of present illness:  55 year old female past oral history of sleep apnea on trach collar along with diabetes mellitus and chronic kidney disease came to the emergency room on early morning of 10/3 complaining of increased weakness 1 day. Patient stated that she herself felt more weak on the right side with some pain. Denied any accidents or injuries. Laryngeal she knows noted to be borderline hypotensive but denies any chest pain or shortness of breath.  Patient's lab work on admission was unrevealing. MRI done of the head noted no signs of acute CVA   Hospital Course:  Principal Problem:   Generalized weakness: ABG done noted chronic respiratory acidosis with chronic hypoxia. Infection was ruled out. Patient was seen by physical and occupational therapy recommended skilled nursing. In general, was felt that her weakness was more generalized and not one-sided and secondary deconditioning. Active Problems:   Diabetes mellitus, type 2 (HCC): CBGs remained stable during this hospitalization, she'll continue on home meds upon  discharge   Chronic pain: Continue home medications   Polyneuropathy (HCC)   Trach dependent /Sleep apnea: Continue nightly trach mask   Procedures:  None   Consultations:  None   Discharge Exam: BP 125/69 (BP Location: Right Arm)   Pulse 72   Temp 97.5 F (36.4 C) (Oral)   Resp 16   Ht 5\' 6"  (1.676 m)   Wt (!) 150.9 kg (332 lb 9.6 oz)   SpO2 91%   BMI 53.68 kg/m   General: Alert and oriented 3, no acute distress  Cardiovascular: Regular rate and rhythm, S1-S2 Respiratory: Decreased breath sounds throughout, in part from body habitus   Discharge Instructions You were cared for by a hospitalist during your hospital stay. If you have any questions about your discharge medications or the care you received while you were in the hospital after you are discharged, you can call the unit and asked to speak with the hospitalist on call if the hospitalist that took care of you is not available. Once you are discharged, your primary care physician will handle any further medical issues. Please note that NO REFILLS for any discharge medications will be authorized once you are discharged, as it is imperative that you return to your primary care physician (or establish a relationship with a primary care physician if you do not have one) for your aftercare needs so that they can reassess your need for medications and monitor your lab values.  Discharge Instructions    Diet - low sodium heart healthy    Complete by:  As directed    Increase activity slowly  Complete by:  As directed        Medication List    TAKE these medications   albuterol 108 (90 Base) MCG/ACT inhaler Commonly known as:  PROVENTIL HFA;VENTOLIN HFA Inhale 2 puffs into the lungs every 6 (six) hours as needed for wheezing.   amLODipine 10 MG tablet Commonly known as:  NORVASC Take 10 mg by mouth daily.   atenolol 50 MG tablet Commonly known as:  TENORMIN Take 50 mg by mouth daily.   busPIRone 15 MG  tablet Commonly known as:  BUSPAR Take 15 mg by mouth 2 (two) times daily.   clonazePAM 1 MG tablet Commonly known as:  KLONOPIN Take 0.5 mg by mouth at bedtime.   escitalopram 10 MG tablet Commonly known as:  LEXAPRO Take 10 mg by mouth daily.   ferrous sulfate 325 (65 FE) MG tablet Take 325 mg by mouth daily.   glipiZIDE 2.5 MG 24 hr tablet Commonly known as:  GLUCOTROL XL Take 1 tablet (2.5 mg total) by mouth daily with breakfast.   hydrochlorothiazide 25 MG tablet Commonly known as:  HYDRODIURIL Take 0.5 tablets (12.5 mg total) by mouth daily.   hyoscyamine 0.125 MG Tbdp disintergrating tablet Commonly known as:  ANASPAZ Place 0.125 mg under the tongue 3 (three) times daily as needed. For Gas-esophageal  Reflux   ondansetron 4 MG tablet Commonly known as:  ZOFRAN Take 4 mg by mouth daily. Patient states she is taking every day per patient   pregabalin 50 MG capsule Commonly known as:  LYRICA Take 1 capsule (50 mg total) by mouth daily.   saccharomyces boulardii 250 MG capsule Commonly known as:  FLORASTOR Take 250 mg by mouth 2 (two) times daily.   traZODone 150 MG tablet Commonly known as:  DESYREL Take 150 mg by mouth at bedtime.      Allergies  Allergen Reactions  . Buprenorphine Hcl Hives  . Morphine And Related Hives and Dermatitis      The results of significant diagnostics from this hospitalization (including imaging, microbiology, ancillary and laboratory) are listed below for reference.    Significant Diagnostic Studies: Dg Chest 2 View  Result Date: 03/18/2016 CLINICAL DATA:  Shortness of breath, chest pain EXAM: CHEST  2 VIEW COMPARISON:  03/07/2016 FINDINGS: Cardiomediastinal silhouette is unremarkable. Tracheostomy tube is unchanged in position. No pulmonary edema. There is streaky right basilar atelectasis or infiltrate. Bony thorax is stable. IMPRESSION: No pulmonary edema. Streaky right basilar atelectasis or early infiltrate.  Electronically Signed   By: Natasha MeadLiviu  Pop M.D.   On: 03/18/2016 21:09   Ct Angio Chest Pe W Or Wo Contrast  Result Date: 03/19/2016 CLINICAL DATA:  Acute onset of generalized chest pain and cough. Initial encounter. EXAM: CT ANGIOGRAPHY CHEST WITH CONTRAST TECHNIQUE: Multidetector CT imaging of the chest was performed using the standard protocol during bolus administration of intravenous contrast. Multiplanar CT image reconstructions and MIPs were obtained to evaluate the vascular anatomy. The study was repeated, as the initial study did not trigger automatic contrast injection. CONTRAST:  161 mL of Isovue 370 IV contrast COMPARISON:  Chest radiograph performed earlier today 8:31 p.m., and CT of the chest performed 02/22/2016 FINDINGS: There is no evidence of pulmonary embolus. Mild bibasilar atelectasis is noted. The lungs are otherwise clear. There is no evidence of significant focal consolidation, pleural effusion or pneumothorax. No masses are identified; no abnormal focal contrast enhancement is seen. The mediastinum is unremarkable in appearance. No mediastinal lymphadenopathy is seen. No pericardial effusion  is identified. The great vessels are grossly unremarkable in appearance. The patient's tracheostomy tube is again noted. No axillary lymphadenopathy is seen. The visualized portions of the thyroid gland are unremarkable in appearance. The visualized portions of the liver and spleen are unremarkable. The visualized portions of the pancreas, stomach, adrenal glands and kidneys are within normal limits. No acute osseous abnormalities are seen. Review of the MIP images confirms the above findings. IMPRESSION: 1. No evidence of pulmonary embolus. 2. Mild bibasilar atelectasis noted.  Lungs otherwise clear. Electronically Signed   By: Roanna Raider M.D.   On: 03/19/2016 01:21   Mr Brain Wo Contrast  Result Date: 04/13/2016 CLINICAL DATA:  Weakness, greater on the right side. EXAM: MRI HEAD WITHOUT CONTRAST  TECHNIQUE: Multiplanar, multiecho pulse sequences of the brain and surrounding structures were obtained without intravenous contrast. COMPARISON:  MRI 09/24/2014 FINDINGS: Brain: There is no evidence of acute infarct, intracranial hemorrhage, mass, midline shift, or extra-axial fluid collection. Ventricles and sulci are within normal limits. Patchy T2 hyperintensities throughout the cerebral white matter bilaterally as well as more confluent T2 hyperintensity extending to the subcortical white matter in the left parietal lobe have not significantly changed. Vascular: Major intracranial vascular flow voids are preserved. Skull and upper cervical spine: Unremarkable bone marrow signal. Sinuses/Orbits: Unremarkable orbits. Trace paranasal sinus mucosal thickening. Clear mastoid air cells. Other: None. IMPRESSION: 1. No acute intracranial abnormality. 2. Moderate cerebral white matter disease, unchanged and nonspecific though may reflect age advanced chronic small vessel ischemia. Electronically Signed   By: Sebastian Ache M.D.   On: 04/13/2016 14:21   Mr Lumbar Spine Wo Contrast  Result Date: 04/13/2016 CLINICAL DATA:  Right-sided weakness, pain EXAM: MRI LUMBAR SPINE WITHOUT CONTRAST TECHNIQUE: Multiplanar, multisequence MR imaging of the lumbar spine was performed. No intravenous contrast was administered. COMPARISON:  None. FINDINGS: Segmentation:  Standard. Alignment:  Physiologic. Vertebrae:  No fracture, evidence of discitis, or bone lesion. Conus medullaris: Extends to the L1 level and appears normal. Paraspinal and other soft tissues: No paraspinal abnormality. Disc levels: Disc spaces: Degenerative disc disease with disc height loss and Modic endplate changes at L5-S1. Disc desiccation at L4-5. T12-L1: No significant disc bulge. No evidence of neural foraminal stenosis. No central canal stenosis. L1-L2: No significant disc bulge. No evidence of neural foraminal stenosis. No central canal stenosis. L2-L3: No  significant disc bulge. No evidence of neural foraminal stenosis. No central canal stenosis. L3-L4: No significant disc bulge. No evidence of neural foraminal stenosis. No central canal stenosis. L4-L5: Mild broad-based disc bulge. Moderate left and mild right facet arthropathy. No evidence of neural foraminal stenosis. No central canal stenosis. L5-S1: Broad-based disc bulge. Moderate bilateral facet arthropathy. No evidence of neural foraminal stenosis. No central canal stenosis. IMPRESSION: 1. At L4-5 there is a mild broad-based disc bulge. Moderate left and mild right facet arthropathy. 2. At L5-S1 there is a broad-based disc bulge. Moderate bilateral facet arthropathy. Electronically Signed   By: Elige Ko   On: 04/13/2016 14:35   Dg Pelvis Portable  Result Date: 04/13/2016 CLINICAL DATA:  Patient woke up yesterday with pain all over. No known injury. EXAM: PORTABLE PELVIS 1-2 VIEWS COMPARISON:  CT pelvis 07/06/2015 FINDINGS: There is no evidence of pelvic fracture or diastasis. No pelvic bone lesions are seen. IMPRESSION: Negative. Electronically Signed   By: Burman Nieves M.D.   On: 04/13/2016 05:54   Dg Chest Portable 1 View  Result Date: 04/12/2016 CLINICAL DATA:  Productive cough since September 15.  Chest pain and shortness of breath on exertion. Pain radiates to the right arm with numbness and tingling in the right hand. EXAM: PORTABLE CHEST 1 VIEW COMPARISON:  03/18/2016 FINDINGS: Tracheostomy. Shallow inspiration. Mild cardiac enlargement without vascular congestion. No focal airspace disease or consolidation. No blunting of costophrenic angles. No pneumothorax. Tortuous aorta. IMPRESSION: No active disease. Electronically Signed   By: Burman Nieves M.D.   On: 04/12/2016 23:30    Microbiology: Recent Results (from the past 240 hour(s))  Culture, blood (Routine x 2)     Status: None (Preliminary result)   Collection Time: 04/12/16  9:35 PM  Result Value Ref Range Status    Specimen Description BLOOD LEFT ARM  Final   Special Requests BOTTLES DRAWN AEROBIC ONLY 5CC  Final   Culture NO GROWTH 2 DAYS  Final   Report Status PENDING  Incomplete  Culture, blood (Routine x 2)     Status: None (Preliminary result)   Collection Time: 04/12/16  9:40 PM  Result Value Ref Range Status   Specimen Description BLOOD LEFT HAND  Final   Special Requests BOTTLES DRAWN AEROBIC ONLY 5CC  Final   Culture NO GROWTH 2 DAYS  Final   Report Status PENDING  Incomplete  Urine culture     Status: Abnormal   Collection Time: 04/13/16  7:01 AM  Result Value Ref Range Status   Specimen Description URINE, RANDOM  Final   Special Requests NONE  Final   Culture MULTIPLE SPECIES PRESENT, SUGGEST RECOLLECTION (A)  Final   Report Status 04/14/2016 FINAL  Final     Labs: Basic Metabolic Panel:  Recent Labs Lab 04/12/16 2140 04/14/16 0438  NA 143 142  K 3.7 3.8  CL 108 109  CO2 29 28  GLUCOSE 105* 86  BUN 11 12  CREATININE 1.18* 0.96  CALCIUM 9.0 8.5*   Liver Function Tests:  Recent Labs Lab 04/12/16 2140  AST 18  ALT 18  ALKPHOS 56  BILITOT 0.5  PROT 6.5  ALBUMIN 3.2*   No results for input(s): LIPASE, AMYLASE in the last 168 hours. No results for input(s): AMMONIA in the last 168 hours. CBC:  Recent Labs Lab 04/12/16 2140 04/14/16 0438  WBC 7.9 7.2  NEUTROABS 4.6  --   HGB 10.8* 10.4*  HCT 35.5* 33.8*  MCV 90.8 90.6  PLT 319 283   Cardiac Enzymes:  Recent Labs Lab 04/12/16 2250 04/13/16 0657  CKTOTAL 120 103   BNP: BNP (last 3 results)  Recent Labs  04/12/16 2250  BNP 69.0    ProBNP (last 3 results) No results for input(s): PROBNP in the last 8760 hours.  CBG:  Recent Labs Lab 04/14/16 1154 04/14/16 1659 04/14/16 2103 04/15/16 0815 04/15/16 1134  GLUCAP 110* 110* 90 95 119*       Signed:  Hollice Espy, MD [facility] 04/15/2016, 1:43 PM

## 2016-04-16 ENCOUNTER — Non-Acute Institutional Stay (SKILLED_NURSING_FACILITY): Payer: Medicaid Other | Admitting: Adult Health

## 2016-04-16 ENCOUNTER — Other Ambulatory Visit: Payer: Self-pay | Admitting: *Deleted

## 2016-04-16 ENCOUNTER — Encounter: Payer: Self-pay | Admitting: Adult Health

## 2016-04-16 DIAGNOSIS — N183 Chronic kidney disease, stage 3 unspecified: Secondary | ICD-10-CM

## 2016-04-16 DIAGNOSIS — M797 Fibromyalgia: Secondary | ICD-10-CM | POA: Diagnosis not present

## 2016-04-16 DIAGNOSIS — I639 Cerebral infarction, unspecified: Secondary | ICD-10-CM | POA: Diagnosis not present

## 2016-04-16 DIAGNOSIS — I10 Essential (primary) hypertension: Secondary | ICD-10-CM

## 2016-04-16 DIAGNOSIS — G4730 Sleep apnea, unspecified: Secondary | ICD-10-CM | POA: Diagnosis not present

## 2016-04-16 DIAGNOSIS — G629 Polyneuropathy, unspecified: Secondary | ICD-10-CM

## 2016-04-16 DIAGNOSIS — E1122 Type 2 diabetes mellitus with diabetic chronic kidney disease: Secondary | ICD-10-CM

## 2016-04-16 MED ORDER — CLONAZEPAM 0.5 MG PO TABS: ORAL_TABLET | ORAL | 1 refills | Status: DC

## 2016-04-16 NOTE — Telephone Encounter (Signed)
[facility] #[phone] Fax:[phone]  

## 2016-04-16 NOTE — Progress Notes (Signed)
Patient ID: Yesenia [name], female   DOB: 02-02-1961, 55 y.o.   MRN: [id number]   Location:   [facility] Room Number: 116-A Place of Service:  SNF (31)   CODE STATUS: Full Code  Allergies  Allergen Reactions  . Buprenorphine Hcl Hives  . Morphine And Related Hives and Dermatitis    Chief Complaint  Patient presents with  . Hospitalization Follow-up    Hospital Follow up    HPI:  She has been hospitalized for generalized weakness with the right worse than the left. It was felt a the hospital that the weakness was generalized. She is here for short term rehab with her goal to return back home, there are no nursing concerns at this time,   Past Medical History:  Diagnosis Date  . Anemia   . Anemia   . Chronic pain   . CKD (chronic kidney disease)   . Diabetes mellitus without complication (HCC)    diet controlled. does not check CBG's  . Fibromyalgia    hospitilized 12/16 due to inability to walk  . Hyperlipemia   . Hypertension   . Insomnia   . Neuropathy (HCC)   . Right sided weakness   . Stroke [facility]) 2014?   no residual weakness   . Vitamin deficiency    Vit D    Past Surgical History:  Procedure Laterality Date  . KNEE SURGERY  1992, 1995, 1997  . LEG SURGERY Right 2007   Surgery x3 2 arthroscopies and one rod  . OOPHORECTOMY Right 1993?  . TRACHEOSTOMY TUBE PLACEMENT N/A 02/13/2016   Procedure: TRACHEOSTOMY;  Surgeon: Christia Reading, MD;  Location: [facility] OR;  Service: ENT;  Laterality: N/A;    Social History   Social History  . Marital status: Single    Spouse name: N/A  . Number of children: 2  . Years of education: 12th    Occupational History  . [employer]    Social History Main Topics  . Smoking status: Never Smoker  . Smokeless tobacco: Never Used  . Alcohol use No  . Drug use: No  . Sexual activity: No   Other Topics Concern  . Not on file   Social History Narrative   Reports no caffeine use    Family History  Problem Relation Age of  Onset  . Breast cancer Mother   . Leukemia Brother   . Breast cancer Maternal Grandmother       VITAL SIGNS BP 132/70   Pulse 74   Temp 97.6 F (36.4 C) (Oral)   Resp 20   Ht 5\' 7"  (1.702 m)   Wt (!) 330 lb (149.7 kg)   SpO2 92%   BMI 51.69 kg/m   Patient's Medications  New Prescriptions   No medications on file  Previous Medications   ALBUTEROL (PROVENTIL HFA;VENTOLIN HFA) 108 (90 BASE) MCG/ACT INHALER    Inhale 2 puffs into the lungs every 6 (six) hours as needed for wheezing.   AMLODIPINE (NORVASC) 10 MG TABLET    Take 10 mg by mouth daily.   ATENOLOL (TENORMIN) 50 MG TABLET    Take 50 mg by mouth daily.   BUSPIRONE (BUSPAR) 15 MG TABLET    Take 15 mg by mouth 2 (two) times daily.   CLONAZEPAM (KLONOPIN) 1 MG TABLET    Take 0.5 tablets (0.5 mg total) by mouth at bedtime.   ESCITALOPRAM (LEXAPRO) 10 MG TABLET    Take 10 mg by mouth daily.    FERROUS  SULFATE 325 (65 FE) MG TABLET    Take 325 mg by mouth daily.   GLIPIZIDE (GLUCOTROL XL) 2.5 MG 24 HR TABLET    Take 1 tablet (2.5 mg total) by mouth daily with breakfast.   HYDROCHLOROTHIAZIDE (HYDRODIURIL) 25 MG TABLET    Take 0.5 tablets (12.5 mg total) by mouth daily.   HYOSCYAMINE (ANASPAZ) 0.125 MG TBDP DISINTERGRATING TABLET    Place 0.125 mg under the tongue 3 (three) times daily as needed. For Gas-esophageal  Reflux   ONDANSETRON (ZOFRAN) 4 MG TABLET    Take 4 mg by mouth daily. Patient states she is taking every day per patient   PREGABALIN (LYRICA) 50 MG CAPSULE    Take 1 capsule (50 mg total) by mouth daily.   SACCHAROMYCES BOULARDII (FLORASTOR) 250 MG CAPSULE    Take 250 mg by mouth 2 (two) times daily.   TRAZODONE (DESYREL) 150 MG TABLET    Take 150 mg by mouth at bedtime.  Modified Medications   No medications on file  Discontinued Medications   No medications on file     SIGNIFICANT DIAGNOSTIC EXAMS  07-06-15: bilateral hip and pelvis x-ray; Negative exam.  07-06-15: ct scan of  left hip: No evidence of  fracture or dislocation. No significant joint effusion. No definite findings seen to suggest septic arthritis at this time  07-17-15: left elbow x-ray: 1. No evidence of acute fracture or dislocation. 2. Several small osseous fragments overlying the medial epicondyle, likely reflecting remote injury.  03-06-16: chest x-ray: Low volumes with some increase in bibasilar atelectasis or infiltrate.  03-07-16: chest x-ray: No acute cardiopulmonary findings.  04-12-16: chest x-ray: Tracheostomy. Shallow inspiration. Mild cardiac enlargement without vascular congestion. No focal airspace disease or consolidation. No blunting of costophrenic angles. No pneumothorax. Tortuous aorta.  04-13-16: pelvic x-ray: There is no evidence of pelvic fracture or diastasis. No pelvic bone lesions are seen.  04-13-16: mri brain: 1. No acute intracranial abnormality. 2. Moderate cerebral white matter disease, unchanged and nonspecific though may reflect age advanced chronic small vessel ischemia.   04-13-16: mri of lumbar spine: 1. At L4-5 there is a mild broad-based disc bulge. Moderate left and mild right facet arthropathy. 2. At L5-S1 there is a broad-based disc bulge. Moderate bilateral facet arthropathy.    LABS REVIEWED:   07-07-15: wbc 9.2; hgb 9.8; hct 30.7; mcv 89.5; plt 260 glucose 123; bun 17; creat 1.29; k+ 3.3; na++142; vitamin B12: 552; folate 9.2; iron 71; TIBC 253 07-09-15: wbc 7.7; hgb 9.7; hct 30.6; mcv 88.7; ;plt 290; glucose 118; bun 14; creat 0.99; k+ 3.6; na++142 07-17-15: wbc 10.0; hgb 11.3; hct 33.9; mcv 88.1; plt 318; glucose 112; bun 16; creat 1.13; k+ 3.7; na++142; sed rate 60; CRP 3.1  02-05-16: hgb a1c 6.1 03-06-16: wbc 7.1;hb 11.8; hct 36.8;mcv 88.0; plt 396; glucose 110; bun 16; creat 1.26; k+ 3.1; na++ 140; urine culture: 40,000 enterococcus 03-11-16: wbc 7.4; hgb 9.4; hct 30.2; mcv 88.3; plt 294; glucose 106; bun 13; creat 1.11; k+ 3.9; na++ 141; liver normal albumin 2.9  04-12-16: wbc 7.9;  hgb 10.8; hct 35.5; mcv 90.8; plt 319; glucose 105; bun 11; creat 1.18; k+ 3.7; na++ 143; liver normal albumin 3.2; BNP 69; blood culture: no growth 04-13-16: tsh 0.720; urine culture: multiple species 04-14-16: wbc 7.2; hgb 10.4; hct 33.8; mcv 90.6; plt 283; glucose 86; bun 12; creat 0.96; k+ 3.8; na++ 142     Review of Systems  Constitutional: Negative for malaise/fatigue.  Respiratory: Negative  for cough and shortness of breath.   Cardiovascular: Negative for chest pain, palpitations and leg swelling.  Gastrointestinal: negative for diarrhea Negative for heartburn, nausea, abdominal pain, has frequent loose stools.   Musculoskeletal: Negative for myalgias and joint pain.  Skin: Negative.   Neurological: Negative for headaches. has right side weakness Psychiatric/Behavioral: The patient is not nervous/anxious.     Physical Exam  Constitutional: She is oriented to person, place, and time. She appears well-developed and well-nourished. No distress.  Morbidly obese   Eyes: Conjunctivae are normal.  Neck: Neck supple. No JVD present. No thyromegaly present.  Has trach  Cardiovascular: Normal rate, regular rhythm and intact distal pulses.   Respiratory: Effort normal and breath sounds normal. No respiratory distress. She has no wheezes.  GI: Soft. Bowel sounds are normal. She exhibits no distension. There is no tenderness.  Musculoskeletal: She exhibits no edema.  Able to move all extremities Does have right side weakness present.    Lymphadenopathy:    She has no cervical adenopathy.  Neurological: She is alert and oriented to person, place, and time.  Skin: Skin is warm and dry. She is not diaphoretic.  Psychiatric: She has a normal mood and affect.     ASSESSMENT/ PLAN:  1. Hypertension: will continue hctz 12.5 mg daily tenormin 50 mg daily  2. GERD: will continue hyoscyamine 0.125 mg SL three times daily as needed   3. CVA: is neurologically stable; will not make changes  will monitor   4. Fibromyalgia; will continue lyrica 50 mg daily   5. CKD stage III: bun/creat 12/0.96; will monitor  6. OSA: is status post trach; will monitor  7. Diabetes: hgb a1c 6.1; will continue glucotrol xl 2.5 mg daily   8. Anemia: hgb 10.4; will continue iron daily   9.  Depression with anxiety: will continue lexapro 10 mg daily will continue trazodone 150 mg nightly and  klonopin  0.5 mg nightly for anxiety   10. 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   11. Generalized weakness: will continue therapy as directed in order to improve upon her strength; adl training; gait and mobility. Will monitor her status.        Time spent with patient  50  minutes >50% time spent counseling; reviewing medical record; tests; labs; and developing future plan of care     MD is aware of resident's narcotic use and is in agreement with current plan of care. We will attempt to wean resident as appropriate.     Synthia Innocenteborah Cyncere Ruhe NP [facility]  Contact [phone] Monday through Friday 8am- 5pm  After hours call [phone]

## 2016-04-17 LAB — CULTURE, BLOOD (ROUTINE X 2)
CULTURE: NO GROWTH
CULTURE: NO GROWTH

## 2016-04-19 ENCOUNTER — Encounter: Payer: Self-pay | Admitting: Internal Medicine

## 2016-04-19 ENCOUNTER — Non-Acute Institutional Stay (SKILLED_NURSING_FACILITY): Payer: Medicaid Other | Admitting: Internal Medicine

## 2016-04-19 DIAGNOSIS — R3915 Urgency of urination: Secondary | ICD-10-CM | POA: Diagnosis not present

## 2016-04-19 DIAGNOSIS — M25561 Pain in right knee: Secondary | ICD-10-CM

## 2016-04-19 DIAGNOSIS — E1142 Type 2 diabetes mellitus with diabetic polyneuropathy: Secondary | ICD-10-CM

## 2016-04-19 DIAGNOSIS — F418 Other specified anxiety disorders: Secondary | ICD-10-CM

## 2016-04-19 DIAGNOSIS — G629 Polyneuropathy, unspecified: Secondary | ICD-10-CM | POA: Diagnosis not present

## 2016-04-19 DIAGNOSIS — R5381 Other malaise: Secondary | ICD-10-CM

## 2016-04-19 DIAGNOSIS — Z930 Tracheostomy status: Secondary | ICD-10-CM

## 2016-04-19 DIAGNOSIS — I10 Essential (primary) hypertension: Secondary | ICD-10-CM

## 2016-04-19 DIAGNOSIS — D508 Other iron deficiency anemias: Secondary | ICD-10-CM | POA: Diagnosis not present

## 2016-04-19 DIAGNOSIS — M797 Fibromyalgia: Secondary | ICD-10-CM

## 2016-04-19 NOTE — Progress Notes (Signed)
Patient ID: Yesenia [name], female   DOB: 12-[name]-1961, 55 y.o.   MRN: [id number]    HISTORY AND PHYSICAL   DATE:  04/19/2016  Location:    [facility] Room Number: 116 A Place of Service: SNF (31)   Extended Emergency Contact Information Primary Emergency Contact: Howell,Catrina  [place] Phone: [phone] Relation: Daughter  Advanced Directive information Does patient have an advance directive?: Yes, Would patient like information on creating an advanced directive?: No - patient declined information, Does patient want to make changes to advanced directive?: No - Patient declined  Chief Complaint  Patient presents with  . Readmit To SNF    HPI:  55 yo female seen today as a readmission into SNF following hospital stay for generalized weakness, sleep apnea s/p trach, chronic pain syndrome due to neuropathy, DM type 2, CKD, chronic respiratory failure with hypoxia. MRI brain neg for acute process. CT pelvis neg for acute process/fx. MRI L spine revealed L4-L5 mild bulging disc and moderate L>R facet arthropathy; L5-S1 disc bulge and moderate b/l facet arthropathy ; DDD.  TSH 0.720. Urine cx showed contaminates. Hgb 10.4. Albumin 3.2. Cr 1.18-->0.96. She presents to SNF for short term rehab.  She reports pain 8/10 on scale in right knee. She states her right knee buckled while walking the day before her presentation to the ER. She was able to stop her fall but next day was unable to walk on right leg. No new numbness/tingling. She has urinary urgency and was told that her bladder had dropped and foley cath could not be inserted because of that. She has never seen a urologist. She does not recall NP seeing her for hospital f/u at SNF last week. She is a poor historian due to psych d/o and memory loss  Hypertension - BP stable on HCTZ 12.5 mg daily; tenormin 50 mg daily  GERD - stable on hyoscyamine 0.125 mg SL three times daily as needed   Hx CVA -  stable.  Fibromyalgia -  Pain stable on lyrica 50 mg daily   CKD stage III - stable. Cr 0.96 at d/c  OSA - s/p trach  DM - controlled. A1c 6.1%. Takes glucotrol xl 2.5 mg daily   Hx   Anemia - stable. Hgb 10.4. Takes iron daily  Depression with anxiety - stable on lexapro 10 mg daily, trazodone 150 mg nightly, klonopin  0.5 mg nightly for anxiety   IBS diarrhea type - takes imodium 2 mg ac meals  Past Medical History:  Diagnosis Date  . Anemia   . Anemia   . Chronic pain   . CKD (chronic kidney disease)   . Diabetes mellitus without complication (HCC)    diet controlled. does not check CBG's  . Fibromyalgia    hospitilized 12/16 due to inability to walk  . Hyperlipemia   . Hypertension   . Insomnia   . Neuropathy ([place])   . Right sided weakness   . Stroke [facility]) 2014?   no residual weakness   . Vitamin deficiency    Vit D    Past Surgical History:  Procedure Laterality Date  . [place]  . LEG SURGERY Right 2007   Surgery x3 2 arthroscopies and one rod  . OOPHORECTOMY Right 1993?  . TRACHEOSTOMY TUBE PLACEMENT N/A 02/13/2016   Procedure: TRACHEOSTOMY;  Surgeon: Melida Quitter, MD;  Location: [facility];  Service: ENT;  Laterality: N/A;    Patient Care Team: [facility]  [place], PA-C as PCP - General (Physician Assistant) Sofie Rower, PA-C as Physician Assistant (Physician Assistant)  Social History   Social History  . Marital status: Single    Spouse name: N/A  . Number of children: 2  . Years of education: 12th    Occupational History  . [employer]    Social History Main Topics  . Smoking status: Never Smoker  . Smokeless tobacco: Never Used  . Alcohol use No  . Drug use: No  . Sexual activity: No   Other Topics Concern  . Not on file   Social History Narrative   Reports no caffeine use      reports that she has never smoked. She has never used smokeless tobacco. She reports that she does not drink alcohol or use drugs.  Family  History  Problem Relation Age of Onset  . Breast cancer Mother   . Leukemia Brother   . Breast cancer Maternal Grandmother    Family Status  Relation Status  . Mother Deceased  . Brother   . Maternal Grandmother     Immunization History  Administered Date(s) Administered  . Influenza,inj,Quad PF,36+ Mos 04/12/2013  . PPD Test 02/28/2016  . Pneumococcal Polysaccharide-23 04/12/2013    Allergies  Allergen Reactions  . Buprenorphine Hcl Hives  . Morphine And Related Hives and Dermatitis    Medications: Patient's Medications  New Prescriptions   No medications on file  Previous Medications   ALBUTEROL (PROVENTIL HFA;VENTOLIN HFA) 108 (90 BASE) MCG/ACT INHALER    Inhale 2 puffs into the lungs every 6 (six) hours as needed for wheezing.   AMLODIPINE (NORVASC) 10 MG TABLET    Take 10 mg by mouth daily.   ATENOLOL (TENORMIN) 50 MG TABLET    Take 50 mg by mouth daily.   BUSPIRONE (BUSPAR) 15 MG TABLET    Take 15 mg by mouth 2 (two) times daily.   CLONAZEPAM (KLONOPIN) 0.5 MG TABLET    Take one tablet by mouth once daily at bedtime   ESCITALOPRAM (LEXAPRO) 10 MG TABLET    Take 10 mg by mouth daily.    FERROUS SULFATE 325 (65 FE) MG TABLET    Take 325 mg by mouth daily.   GLIPIZIDE (GLUCOTROL XL) 2.5 MG 24 HR TABLET    Take 1 tablet (2.5 mg total) by mouth daily with breakfast.   HYDROCHLOROTHIAZIDE (HYDRODIURIL) 25 MG TABLET    Take 0.5 tablets (12.5 mg total) by mouth daily.   HYOSCYAMINE (ANASPAZ) 0.125 MG TBDP DISINTERGRATING TABLET    Place 0.125 mg under the tongue 3 (three) times daily as needed. For Gas-esophageal  Reflux   LOPERAMIDE (IMODIUM) 2 MG CAPSULE    Take 2 mg by mouth 3 (three) times daily before meals.   ONDANSETRON (ZOFRAN) 4 MG TABLET    Take 4 mg by mouth daily. Patient states she is taking every day per patient   PREGABALIN (LYRICA) 50 MG CAPSULE    Take 1 capsule (50 mg total) by mouth daily.   SACCHAROMYCES BOULARDII (FLORASTOR) 250 MG CAPSULE    Take 250 mg  by mouth 2 (two) times daily.   TRAZODONE (DESYREL) 150 MG TABLET    Take 150 mg by mouth at bedtime.  Modified Medications   No medications on file  Discontinued Medications   No medications on file    Review of Systems  Unable to perform ROS: Psychiatric disorder    Vitals:   04/19/16 0910  BP: 132/70  Pulse: 74  Resp: 20  Temp: 97.6 F (36.4 C)  TempSrc: Oral  SpO2: 98%  Weight: (!) 330 lb (149.7 kg)  Height: _0  (1.702 m)   Body mass index is 51.69 kg/m.  Physical Exam  Constitutional: She appears well-developed and well-nourished.  Lying in bed resting in NAD, trach intact  HENT:  Mouth/Throat: Oropharynx is clear and moist. No oropharyngeal exudate.  Eyes: Pupils are equal, round, and reactive to light. No scleral icterus.  Neck: Neck supple. Carotid bruit is not present. No tracheal deviation present. No thyromegaly present.  Trach intact and capped. no bloody sputum or purulent sputum. No redness or swelling at insertion site  Cardiovascular: Regular rhythm and intact distal pulses.  Tachycardia present.  Exam reveals no gallop and no friction rub.   Murmur heard.  Systolic murmur is present with a grade of 1/6  No LE edema b/l. no calf TTP.   Pulmonary/Chest: Effort normal. No stridor. No respiratory distress. She has wheezes (end expiratory b/l). She has no rales.  Abdominal: Soft. Bowel sounds are normal. She exhibits no distension and no mass. There is no hepatomegaly. There is no tenderness. There is no rebound and no guarding.  Musculoskeletal: She exhibits edema and tenderness.  Right knee anterior swelling with reduced ROM and guarding, TTP laterally.  Lymphadenopathy:    She has no cervical adenopathy.  Neurological: She is alert.  Skin: Skin is warm and dry. No rash noted.  Psychiatric: She has a normal mood and affect. Her behavior is normal. Thought content normal.  Appears confused     Labs reviewed: Admission on 04/12/2016, Discharged on  04/15/2016  Component Date Value Ref Range Status  . Sodium 04/12/2016 143  135 - 145 mmol/L Final  . Potassium 04/12/2016 3.7  3.5 - 5.1 mmol/L Final  . Chloride 04/12/2016 108  101 - 111 mmol/L Final  . CO2 04/12/2016 29  22 - 32 mmol/L Final  . Glucose, Bld 04/12/2016 105* 65 - 99 mg/dL Final  . BUN 04/12/2016 11  6 - 20 mg/dL Final  . Creatinine, Ser 04/12/2016 1.18* 0.44 - 1.00 mg/dL Final  . Calcium 04/12/2016 9.0  8.9 - 10.3 mg/dL Final  . Total Protein 04/12/2016 6.5  6.5 - 8.1 g/dL Final  . Albumin 04/12/2016 3.2* 3.5 - 5.0 g/dL Final  . AST 04/12/2016 18  15 - 41 U/L Final  . ALT 04/12/2016 18  14 - 54 U/L Final  . Alkaline Phosphatase 04/12/2016 56  38 - 126 U/L Final  . Total Bilirubin 04/12/2016 0.5  0.3 - 1.2 mg/dL Final  . GFR calc non Af Amer 04/12/2016 51* >60 mL/min Final  . GFR calc Af Amer 04/12/2016 59* >60 mL/min Final   Comment: (NOTE) The eGFR has been calculated using the CKD EPI equation. This calculation has not been validated in all clinical situations. eGFR's persistently <60 mL/min signify possible Chronic Kidney Disease.   . Anion gap 04/12/2016 6  5 - 15 Final  . Lactic Acid, Venous 04/12/2016 0.84  0.5 - 1.9 mmol/L Final  . WBC 04/12/2016 7.9  4.0 - 10.5 K/uL Final  . RBC 04/12/2016 3.91  3.87 - 5.11 MIL/uL Final  . Hemoglobin 04/12/2016 10.8* 12.0 - 15.0 g/dL Final  . HCT 04/12/2016 35.5* 36.0 - 46.0 % Final  . MCV 04/12/2016 90.8  78.0 - 100.0 fL Final  . MCH 04/12/2016 27.6  26.0 - 34.0 pg Final  . MCHC 04/12/2016 30.4  30.0 - 36.0 g/dL Final  .  RDW 04/12/2016 14.6  11.5 - 15.5 % Final  . Platelets 04/12/2016 319  150 - 400 K/uL Final  . Neutrophils Relative % 04/12/2016 58  % Final  . Neutro Abs 04/12/2016 4.6  1.7 - 7.7 K/uL Final  . Lymphocytes Relative 04/12/2016 33  % Final  . Lymphs Abs 04/12/2016 2.6  0.7 - 4.0 K/uL Final  . Monocytes Relative 04/12/2016 7  % Final  . Monocytes Absolute 04/12/2016 0.5  0.1 - 1.0 K/uL Final  .  Eosinophils Relative 04/12/2016 2  % Final  . Eosinophils Absolute 04/12/2016 0.2  0.0 - 0.7 K/uL Final  . Basophils Relative 04/12/2016 0  % Final  . Basophils Absolute 04/12/2016 0.0  0.0 - 0.1 K/uL Final  . Specimen Description 04/17/2016 BLOOD LEFT ARM   Final  . Special Requests 04/17/2016 BOTTLES DRAWN AEROBIC ONLY 5CC   Final  . Culture 04/17/2016 NO GROWTH 5 DAYS   Final  . Report Status 04/17/2016 04/17/2016 FINAL   Final  . Specimen Description 04/17/2016 BLOOD LEFT HAND   Final  . Special Requests 04/17/2016 BOTTLES DRAWN AEROBIC ONLY 5CC   Final  . Culture 04/17/2016 NO GROWTH 5 DAYS   Final  . Report Status 04/17/2016 04/17/2016 FINAL   Final  . Specimen Description 04/14/2016 URINE, RANDOM   Final  . Special Requests 04/14/2016 NONE   Final  . Culture 04/14/2016 MULTIPLE SPECIES PRESENT, SUGGEST RECOLLECTION*  Final  . Report Status 04/14/2016 04/14/2016 FINAL   Final  . Color, Urine 04/13/2016 YELLOW  YELLOW Final  . APPearance 04/13/2016 HAZY* CLEAR Final  . Specific Gravity, Urine 04/13/2016 1.027  1.005 - 1.030 Final  . pH 04/13/2016 5.5  5.0 - 8.0 Final  . Glucose, UA 04/13/2016 NEGATIVE  NEGATIVE mg/dL Final  . Hgb urine dipstick 04/13/2016 MODERATE* NEGATIVE Final  . Bilirubin Urine 04/13/2016 NEGATIVE  NEGATIVE Final  . Ketones, ur 04/13/2016 NEGATIVE  NEGATIVE mg/dL Final  . Protein, ur 04/13/2016 NEGATIVE  NEGATIVE mg/dL Final  . Nitrite 04/13/2016 NEGATIVE  NEGATIVE Final  . Leukocytes, UA 04/13/2016 NEGATIVE  NEGATIVE Final  . Troponin i, poc 04/12/2016 0.00  0.00 - 0.08 ng/mL Final  . Comment 3 04/12/2016          Final   Comment: Due to the release kinetics of cTnI, a negative result within the first hours of the onset of symptoms does not rule out myocardial infarction with certainty. If myocardial infarction is still suspected, repeat the test at appropriate intervals.   . B Natriuretic Peptide 04/12/2016 69.0  0.0 - 100.0 pg/mL Final  . Total CK  04/12/2016 120  38 - 234 U/L Final  . TSH 04/13/2016 0.720  0.350 - 4.500 uIU/mL Final  . Total CK 04/13/2016 103  38 - 234 U/L Final  . pH, Arterial 04/13/2016 7.304* 7.350 - 7.450 Final  . pCO2 arterial 04/13/2016 55.7* 32.0 - 48.0 mmHg Final  . pO2, Arterial 04/13/2016 80.0* 83.0 - 108.0 mmHg Final  . Bicarbonate 04/13/2016 27.8  20.0 - 28.0 mmol/L Final  . TCO2 04/13/2016 29  0 - 100 mmol/L Final  . O2 Saturation 04/13/2016 94.0  % Final  . Acid-Base Excess 04/13/2016 1.0  0.0 - 2.0 mmol/L Final  . Patient temperature 04/13/2016 98.0 F   Final  . Collection site 04/13/2016 RADIAL, ALLEN'S TEST ACCEPTABLE   Final  . Drawn by 04/13/2016 RT   Final  . Sample type 04/13/2016 ARTERIAL   Final  . Squamous Epithelial /  LPF 04/13/2016 6-30* NONE SEEN Final  . WBC, UA 04/13/2016 6-30  0 - 5 WBC/hpf Final  . RBC / HPF 04/13/2016 6-30  0 - 5 RBC/hpf Final  . Bacteria, UA 04/13/2016 FEW* NONE SEEN Final  . Glucose-Capillary 04/13/2016 88  65 - 99 mg/dL Final  . Glucose-Capillary 04/13/2016 88  65 - 99 mg/dL Final  . Sodium 04/14/2016 142  135 - 145 mmol/L Final  . Potassium 04/14/2016 3.8  3.5 - 5.1 mmol/L Final  . Chloride 04/14/2016 109  101 - 111 mmol/L Final  . CO2 04/14/2016 28  22 - 32 mmol/L Final  . Glucose, Bld 04/14/2016 86  65 - 99 mg/dL Final  . BUN 04/14/2016 12  6 - 20 mg/dL Final  . Creatinine, Ser 04/14/2016 0.96  0.44 - 1.00 mg/dL Final  . Calcium 04/14/2016 8.5* 8.9 - 10.3 mg/dL Final  . GFR calc non Af Amer 04/14/2016 >60  >60 mL/min Final  . GFR calc Af Amer 04/14/2016 >60  >60 mL/min Final   Comment: (NOTE) The eGFR has been calculated using the CKD EPI equation. This calculation has not been validated in all clinical situations. eGFR's persistently <60 mL/min signify possible Chronic Kidney Disease.   . Anion gap 04/14/2016 5  5 - 15 Final  . WBC 04/14/2016 7.2  4.0 - 10.5 K/uL Final  . RBC 04/14/2016 3.73* 3.87 - 5.11 MIL/uL Final  . Hemoglobin 04/14/2016 10.4*  12.0 - 15.0 g/dL Final  . HCT 04/14/2016 33.8* 36.0 - 46.0 % Final  . MCV 04/14/2016 90.6  78.0 - 100.0 fL Final  . MCH 04/14/2016 27.9  26.0 - 34.0 pg Final  . MCHC 04/14/2016 30.8  30.0 - 36.0 g/dL Final  . RDW 04/14/2016 14.5  11.5 - 15.5 % Final  . Platelets 04/14/2016 283  150 - 400 K/uL Final  . Glucose-Capillary 04/13/2016 114* 65 - 99 mg/dL Final  . Glucose-Capillary 04/14/2016 87  65 - 99 mg/dL Final  . Glucose-Capillary 04/14/2016 110* 65 - 99 mg/dL Final  . Glucose-Capillary 04/14/2016 110* 65 - 99 mg/dL Final  . Glucose-Capillary 04/14/2016 90  65 - 99 mg/dL Final  . Glucose-Capillary 04/15/2016 95  65 - 99 mg/dL Final  . Glucose-Capillary 04/15/2016 119* 65 - 99 mg/dL Final  Nursing Home on 03/22/2016  Component Date Value Ref Range Status  . Hemoglobin 03/18/2016 10.7* 12.0 - 16.0 g/dL Final  . HCT 03/18/2016 32* 36 - 46 % Final  . Neutrophils Absolute 03/18/2016 2  /L Final  . Platelets 03/18/2016 316  150 - 399 K/L Final  . WBC 03/18/2016 5.8  10^3/mL Final  . Glucose 03/18/2016 83  mg/dL Final  . BUN 03/18/2016 20  4 - 21 mg/dL Final  . Creatinine 03/18/2016 1.2* 0.5 - 1.1 mg/dL Final  . Potassium 03/18/2016 3.9  3.4 - 5.3 mmol/L Final  . Sodium 03/18/2016 144  137 - 147 mmol/L Final  . Alkaline Phosphatase 03/18/2016 63  25 - 125 U/L Final  . ALT 03/18/2016 16  7 - 35 U/L Final  . AST 03/18/2016 13  13 - 35 U/L Final  . Bilirubin, Total 03/18/2016 0.3  mg/dL Final  Admission on 03/18/2016, Discharged on 03/19/2016  Component Date Value Ref Range Status  . WBC 03/18/2016 8.0  4.0 - 10.5 K/uL Final  . RBC 03/18/2016 3.91  3.87 - 5.11 MIL/uL Final  . Hemoglobin 03/18/2016 11.0* 12.0 - 15.0 g/dL Final  . HCT 03/18/2016 34.5* 36.0 - 46.0 %  Final  . MCV 03/18/2016 88.2  78.0 - 100.0 fL Final  . MCH 03/18/2016 28.1  26.0 - 34.0 pg Final  . MCHC 03/18/2016 31.9  30.0 - 36.0 g/dL Final  . RDW 03/18/2016 15.2  11.5 - 15.5 % Final  . Platelets 03/18/2016 298  150 -  400 K/uL Final  . Neutrophils Relative % 03/18/2016 48  % Final  . Neutro Abs 03/18/2016 3.9  1.7 - 7.7 K/uL Final  . Lymphocytes Relative 03/18/2016 40  % Final  . Lymphs Abs 03/18/2016 3.2  0.7 - 4.0 K/uL Final  . Monocytes Relative 03/18/2016 8  % Final  . Monocytes Absolute 03/18/2016 0.7  0.1 - 1.0 K/uL Final  . Eosinophils Relative 03/18/2016 3  % Final  . Eosinophils Absolute 03/18/2016 0.2  0.0 - 0.7 K/uL Final  . Basophils Relative 03/18/2016 1  % Final  . Basophils Absolute 03/18/2016 0.1  0.0 - 0.1 K/uL Final  . Sodium 03/18/2016 140  135 - 145 mmol/L Final  . Potassium 03/18/2016 3.8  3.5 - 5.1 mmol/L Final  . Chloride 03/18/2016 107  101 - 111 mmol/L Final  . CO2 03/18/2016 26  22 - 32 mmol/L Final  . Glucose, Bld 03/18/2016 97  65 - 99 mg/dL Final  . BUN 03/18/2016 17  6 - 20 mg/dL Final  . Creatinine, Ser 03/18/2016 1.24* 0.44 - 1.00 mg/dL Final  . Calcium 03/18/2016 9.0  8.9 - 10.3 mg/dL Final  . Total Protein 03/18/2016 6.7  6.5 - 8.1 g/dL Final  . Albumin 03/18/2016 3.5  3.5 - 5.0 g/dL Final  . AST 03/18/2016 21  15 - 41 U/L Final  . ALT 03/18/2016 21  14 - 54 U/L Final  . Alkaline Phosphatase 03/18/2016 59  38 - 126 U/L Final  . Total Bilirubin 03/18/2016 0.4  0.3 - 1.2 mg/dL Final  . GFR calc non Af Amer 03/18/2016 48* >60 mL/min Final  . GFR calc Af Amer 03/18/2016 56* >60 mL/min Final   Comment: (NOTE) The eGFR has been calculated using the CKD EPI equation. This calculation has not been validated in all clinical situations. eGFR's persistently <60 mL/min signify possible Chronic Kidney Disease.   . Anion gap 03/18/2016 7  5 - 15 Final  . Lactic Acid, Venous 03/18/2016 0.85  0.5 - 1.9 mmol/L Final  . Troponin i, poc 03/18/2016 0.00  0.00 - 0.08 ng/mL Final  . Comment 3 03/18/2016          Final   Comment: Due to the release kinetics of cTnI, a negative result within the first hours of the onset of symptoms does not rule out myocardial infarction with  certainty. If myocardial infarction is still suspected, repeat the test at appropriate intervals.   Admission on 03/06/2016, Discharged on 03/11/2016  Component Date Value Ref Range Status  . Sodium 03/06/2016 140  135 - 145 mmol/L Final  . Potassium 03/06/2016 3.1* 3.5 - 5.1 mmol/L Final  . Chloride 03/06/2016 105  101 - 111 mmol/L Final  . CO2 03/06/2016 26  22 - 32 mmol/L Final  . Glucose, Bld 03/06/2016 110* 65 - 99 mg/dL Final  . BUN 03/06/2016 16  6 - 20 mg/dL Final  . Creatinine, Ser 03/06/2016 1.26* 0.44 - 1.00 mg/dL Final  . Calcium 03/06/2016 9.5  8.9 - 10.3 mg/dL Final  . GFR calc non Af Amer 03/06/2016 47* >60 mL/min Final  . GFR calc Af Amer 03/06/2016 55* >60 mL/min Final  Comment: (NOTE) The eGFR has been calculated using the CKD EPI equation. This calculation has not been validated in all clinical situations. eGFR's persistently <60 mL/min signify possible Chronic Kidney Disease.   . Anion gap 03/06/2016 9  5 - 15 Final  . WBC 03/06/2016 7.1  4.0 - 10.5 K/uL Final  . RBC 03/06/2016 4.18  3.87 - 5.11 MIL/uL Final  . Hemoglobin 03/06/2016 11.8* 12.0 - 15.0 g/dL Final  . HCT 03/06/2016 36.8  36.0 - 46.0 % Final  . MCV 03/06/2016 88.0  78.0 - 100.0 fL Final  . MCH 03/06/2016 28.2  26.0 - 34.0 pg Final  . MCHC 03/06/2016 32.1  30.0 - 36.0 g/dL Final  . RDW 03/06/2016 14.1  11.5 - 15.5 % Final  . Platelets 03/06/2016 396  150 - 400 K/uL Final  . Troponin i, poc 03/06/2016 0.01  0.00 - 0.08 ng/mL Final  . Comment 3 03/06/2016          Final   Comment: Due to the release kinetics of cTnI, a negative result within the first hours of the onset of symptoms does not rule out myocardial infarction with certainty. If myocardial infarction is still suspected, repeat the test at appropriate intervals.   Marland Kitchen D-Dimer, Quant 03/06/2016 0.36  0.00 - 0.50 ug/mL-FEU Final   Comment: (NOTE) At the manufacturer cut-off of 0.50 ug/mL FEU, this assay has been documented to exclude PE  with a sensitivity and negative predictive value of 97 to 99%.  At this time, this assay has not been approved by the FDA to exclude DVT/VTE. Results should be correlated with clinical presentation.   Marland Kitchen Specimen Description 03/11/2016 BLOOD RIGHT ANTECUBITAL   Final  . Special Requests 03/11/2016 BOTTLES DRAWN AEROBIC AND ANAEROBIC 5CC   Final  . Culture 03/11/2016 NO GROWTH 5 DAYS   Final  . Report Status 03/11/2016 03/11/2016 FINAL   Final  . Specimen Description 03/11/2016 BLOOD LEFT ANTECUBITAL   Final  . Special Requests 03/11/2016 BOTTLES DRAWN AEROBIC AND ANAEROBIC 5CC   Final  . Culture 03/11/2016 NO GROWTH 5 DAYS   Final  . Report Status 03/11/2016 03/11/2016 FINAL   Final  . Lactic Acid, Venous 03/06/2016 3.01* 0.5 - 1.9 mmol/L Final  . Comment 03/06/2016 NOTIFIED PHYSICIAN   Final  . pH, Ven 03/06/2016 7.541* 7.250 - 7.300 Final  . pCO2, Ven 03/06/2016 34.0* 45.0 - 50.0 mmHg Final  . pO2, Ven 03/06/2016 30.0* 31.0 - 45.0 mmHg Final  . Bicarbonate 03/06/2016 29.2* 20.0 - 24.0 mEq/L Final  . TCO2 03/06/2016 30  0 - 100 mmol/L Final  . O2 Saturation 03/06/2016 66.0  % Final  . Acid-Base Excess 03/06/2016 7.0* 0.0 - 2.0 mmol/L Final  . Patient temperature 03/06/2016 HIDE   Final  . Sample type 03/06/2016 VENOUS   Final  . Comment 03/06/2016 NOTIFIED PHYSICIAN   Final  . Lactic Acid, Venous 03/06/2016 2.18* 0.5 - 1.9 mmol/L Final  . Comment 03/06/2016 NOTIFIED PHYSICIAN   Final  . Specimen Description 03/10/2016 URINE, RANDOM   Final  . Special Requests 03/10/2016 NONE   Final  . Culture 03/10/2016 40,000 COLONIES/mL ENTEROCOCCUS SPECIES*  Final  . Report Status 03/10/2016 03/10/2016 FINAL   Final  . Organism ID, Bacteria 03/10/2016 ENTEROCOCCUS SPECIES*  Final  . L. pneumophila Serogp 1 Ur Ag 03/08/2016 Negative  Negative Final   Comment: (NOTE) Presumptive negative for L. pneumophila serogroup 1 antigen in urine, suggesting no recent or current infection. Legionnaires'  disease cannot  be ruled out since other serogroups and species may also cause disease. Performed At: [facility] [place], [place] [id number] Lindon Romp MD [id number]   . Source of Sample 03/08/2016 URINE, RANDOM   Final  . Strep Pneumo Urinary Antigen 03/07/2016 NEGATIVE  NEGATIVE Final   Comment:        Infection due to S. pneumoniae cannot be absolutely ruled out since the antigen present may be below the detection limit of the test.   . Specimen Description 03/09/2016 TRACHEAL ASPIRATE   Final  . Special Requests 03/09/2016 NONE   Final  . Gram Stain 03/09/2016    Final                   Value:FEW WBC PRESENT, PREDOMINANTLY PMN RARE GRAM POSITIVE RODS   . Culture 03/09/2016 FEW ENTEROBACTER AEROGENES   Final  . Report Status 03/09/2016 03/09/2016 FINAL   Final  . Organism ID, Bacteria 03/09/2016 ENTEROBACTER AEROGENES   Final  . Sodium 03/07/2016 141  135 - 145 mmol/L Final  . Potassium 03/07/2016 3.4* 3.5 - 5.1 mmol/L Final  . Chloride 03/07/2016 108  101 - 111 mmol/L Final  . CO2 03/07/2016 27  22 - 32 mmol/L Final  . Glucose, Bld 03/07/2016 127* 65 - 99 mg/dL Final  . BUN 03/07/2016 13  6 - 20 mg/dL Final  . Creatinine, Ser 03/07/2016 1.26* 0.44 - 1.00 mg/dL Final  . Calcium 03/07/2016 8.8* 8.9 - 10.3 mg/dL Final  . Total Protein 03/07/2016 6.5  6.5 - 8.1 g/dL Final  . Albumin 03/07/2016 2.9* 3.5 - 5.0 g/dL Final  . AST 03/07/2016 16  15 - 41 U/L Final  . ALT 03/07/2016 16  14 - 54 U/L Final  . Alkaline Phosphatase 03/07/2016 60  38 - 126 U/L Final  . Total Bilirubin 03/07/2016 0.6  0.3 - 1.2 mg/dL Final  . GFR calc non Af Amer 03/07/2016 47* >60 mL/min Final  . GFR calc Af Amer 03/07/2016 55* >60 mL/min Final   Comment: (NOTE) The eGFR has been calculated using the CKD EPI equation. This calculation has not been validated in all clinical situations. eGFR's persistently <60 mL/min signify possible Chronic Kidney Disease.   . Anion  gap 03/07/2016 6  5 - 15 Final  . WBC 03/07/2016 6.4  4.0 - 10.5 K/uL Final  . RBC 03/07/2016 3.72* 3.87 - 5.11 MIL/uL Final  . Hemoglobin 03/07/2016 10.3* 12.0 - 15.0 g/dL Final  . HCT 03/07/2016 32.9* 36.0 - 46.0 % Final  . MCV 03/07/2016 88.4  78.0 - 100.0 fL Final  . MCH 03/07/2016 27.7  26.0 - 34.0 pg Final  . MCHC 03/07/2016 31.3  30.0 - 36.0 g/dL Final  . RDW 03/07/2016 14.3  11.5 - 15.5 % Final  . Platelets 03/07/2016 311  150 - 400 K/uL Final  . Prothrombin Time 03/07/2016 15.8* 11.4 - 15.2 seconds Final  . INR 03/07/2016 1.26   Final  . Glucose-Capillary 03/07/2016 143* 65 - 99 mg/dL Final  . Glucose-Capillary 03/07/2016 140* 65 - 99 mg/dL Final  . Glucose-Capillary 03/07/2016 153* 65 - 99 mg/dL Final  . Comment 1 03/07/2016 Notify RN   Final  . Comment 2 03/07/2016 Document in Chart   Final  . Sodium 03/08/2016 143  135 - 145 mmol/L Final  . Potassium 03/08/2016 3.8  3.5 - 5.1 mmol/L Final  . Chloride 03/08/2016 110  101 - 111 mmol/L Final  . CO2 03/08/2016 26  22 - 32 mmol/L Final  . Glucose, Bld 03/08/2016 122* 65 - 99 mg/dL Final  . BUN 03/08/2016 13  6 - 20 mg/dL Final  . Creatinine, Ser 03/08/2016 1.28* 0.44 - 1.00 mg/dL Final  . Calcium 03/08/2016 9.0  8.9 - 10.3 mg/dL Final  . GFR calc non Af Amer 03/08/2016 47* >60 mL/min Final  . GFR calc Af Amer 03/08/2016 54* >60 mL/min Final   Comment: (NOTE) The eGFR has been calculated using the CKD EPI equation. This calculation has not been validated in all clinical situations. eGFR's persistently <60 mL/min signify possible Chronic Kidney Disease.   . Anion gap 03/08/2016 7  5 - 15 Final  . Magnesium 03/08/2016 1.9  1.7 - 2.4 mg/dL Final  . Glucose-Capillary 03/07/2016 120* 65 - 99 mg/dL Final  . Glucose-Capillary 03/08/2016 117* 65 - 99 mg/dL Final  . Glucose-Capillary 03/08/2016 173* 65 - 99 mg/dL Final  . Glucose-Capillary 03/08/2016 88  65 - 99 mg/dL Final  . Glucose-Capillary 03/08/2016 138* 65 - 99 mg/dL Final   . Glucose-Capillary 03/09/2016 122* 65 - 99 mg/dL Final  . Glucose-Capillary 03/09/2016 117* 65 - 99 mg/dL Final  . Glucose-Capillary 03/09/2016 98  65 - 99 mg/dL Final  . Glucose-Capillary 03/09/2016 142* 65 - 99 mg/dL Final  . Glucose-Capillary 03/10/2016 112* 65 - 99 mg/dL Final  . Glucose-Capillary 03/10/2016 135* 65 - 99 mg/dL Final  . Sodium 03/11/2016 141  135 - 145 mmol/L Final  . Potassium 03/11/2016 3.9  3.5 - 5.1 mmol/L Final  . Chloride 03/11/2016 110  101 - 111 mmol/L Final  . CO2 03/11/2016 26  22 - 32 mmol/L Final  . Glucose, Bld 03/11/2016 106* 65 - 99 mg/dL Final  . BUN 03/11/2016 13  6 - 20 mg/dL Final  . Creatinine, Ser 03/11/2016 1.11* 0.44 - 1.00 mg/dL Final  . Calcium 03/11/2016 8.9  8.9 - 10.3 mg/dL Final  . Total Protein 03/11/2016 6.0* 6.5 - 8.1 g/dL Final  . Albumin 03/11/2016 2.9* 3.5 - 5.0 g/dL Final  . AST 03/11/2016 24  15 - 41 U/L Final  . ALT 03/11/2016 23  14 - 54 U/L Final  . Alkaline Phosphatase 03/11/2016 53  38 - 126 U/L Final  . Total Bilirubin 03/11/2016 0.3  0.3 - 1.2 mg/dL Final  . GFR calc non Af Amer 03/11/2016 55* >60 mL/min Final  . GFR calc Af Amer 03/11/2016 >60  >60 mL/min Final   Comment: (NOTE) The eGFR has been calculated using the CKD EPI equation. This calculation has not been validated in all clinical situations. eGFR's persistently <60 mL/min signify possible Chronic Kidney Disease.   . Anion gap 03/11/2016 5  5 - 15 Final  . WBC 03/11/2016 7.4  4.0 - 10.5 K/uL Final  . RBC 03/11/2016 3.42* 3.87 - 5.11 MIL/uL Final  . Hemoglobin 03/11/2016 9.4* 12.0 - 15.0 g/dL Final  . HCT 03/11/2016 30.2* 36.0 - 46.0 % Final  . MCV 03/11/2016 88.3  78.0 - 100.0 fL Final  . MCH 03/11/2016 27.5  26.0 - 34.0 pg Final  . MCHC 03/11/2016 31.1  30.0 - 36.0 g/dL Final  . RDW 03/11/2016 14.7  11.5 - 15.5 % Final  . Platelets 03/11/2016 294  150 - 400 K/uL Final  . Neutrophils Relative % 03/11/2016 49  % Final  . Neutro Abs 03/11/2016 3.6  1.7 -  7.7 K/uL Final  . Lymphocytes Relative 03/11/2016 40  % Final  . Lymphs Abs 03/11/2016 2.9  0.7 - 4.0 K/uL Final  . Monocytes Relative 03/11/2016 7  % Final  . Monocytes Absolute 03/11/2016 0.5  0.1 - 1.0 K/uL Final  . Eosinophils Relative 03/11/2016 4  % Final  . Eosinophils Absolute 03/11/2016 0.3  0.0 - 0.7 K/uL Final  . Basophils Relative 03/11/2016 0  % Final  . Basophils Absolute 03/11/2016 0.0  0.0 - 0.1 K/uL Final  . Glucose-Capillary 03/10/2016 105* 65 - 99 mg/dL Final  . Glucose-Capillary 03/10/2016 122* 65 - 99 mg/dL Final  . Glucose-Capillary 03/11/2016 100* 65 - 99 mg/dL Final  . Comment 1 03/11/2016 Document in Chart   Final  . Glucose-Capillary 03/11/2016 120* 65 - 99 mg/dL Final  . Glucose-Capillary 03/11/2016 105* 65 - 99 mg/dL Final  Admission on 02/21/2016, Discharged on 02/25/2016  Component Date Value Ref Range Status  . Sodium 02/21/2016 137  135 - 145 mmol/L Final  . Potassium 02/21/2016 4.4  3.5 - 5.1 mmol/L Final  . Chloride 02/21/2016 100* 101 - 111 mmol/L Final  . CO2 02/21/2016 29  22 - 32 mmol/L Final  . Glucose, Bld 02/21/2016 107* 65 - 99 mg/dL Final  . BUN 02/21/2016 9  6 - 20 mg/dL Final  . Creatinine, Ser 02/21/2016 1.13* 0.44 - 1.00 mg/dL Final  . Calcium 02/21/2016 8.9  8.9 - 10.3 mg/dL Final  . Total Protein 02/21/2016 7.2  6.5 - 8.1 g/dL Final  . Albumin 02/21/2016 3.0* 3.5 - 5.0 g/dL Final  . AST 02/21/2016 29  15 - 41 U/L Final  . ALT 02/21/2016 33  14 - 54 U/L Final  . Alkaline Phosphatase 02/21/2016 101  38 - 126 U/L Final  . Total Bilirubin 02/21/2016 1.0  0.3 - 1.2 mg/dL Final  . GFR calc non Af Amer 02/21/2016 54* >60 mL/min Final  . GFR calc Af Amer 02/21/2016 >60  >60 mL/min Final   Comment: (NOTE) The eGFR has been calculated using the CKD EPI equation. This calculation has not been validated in all clinical situations. eGFR's persistently <60 mL/min signify possible Chronic Kidney Disease.   . Anion gap 02/21/2016 8  5 - 15 Final   . WBC 02/21/2016 11.8* 4.0 - 10.5 K/uL Final  . RBC 02/21/2016 4.23  3.87 - 5.11 MIL/uL Final  . Hemoglobin 02/21/2016 12.1  12.0 - 15.0 g/dL Final  . HCT 02/21/2016 36.9  36.0 - 46.0 % Final  . MCV 02/21/2016 87.2  78.0 - 100.0 fL Final  . MCH 02/21/2016 28.6  26.0 - 34.0 pg Final  . MCHC 02/21/2016 32.8  30.0 - 36.0 g/dL Final  . RDW 02/21/2016 13.8  11.5 - 15.5 % Final  . Platelets 02/21/2016 404* 150 - 400 K/uL Final  . Neutrophils Relative % 02/21/2016 51  % Final  . Neutro Abs 02/21/2016 6.0  1.7 - 7.7 K/uL Final  . Lymphocytes Relative 02/21/2016 37  % Final  . Lymphs Abs 02/21/2016 4.4* 0.7 - 4.0 K/uL Final  . Monocytes Relative 02/21/2016 10  % Final  . Monocytes Absolute 02/21/2016 1.1* 0.1 - 1.0 K/uL Final  . Eosinophils Relative 02/21/2016 2  % Final  . Eosinophils Absolute 02/21/2016 0.3  0.0 - 0.7 K/uL Final  . Basophils Relative 02/21/2016 0  % Final  . Basophils Absolute 02/21/2016 0.0  0.0 - 0.1 K/uL Final  . Troponin i, poc 02/21/2016 0.01  0.00 - 0.08 ng/mL Final  . Comment 3 02/21/2016          Final   Comment:  Due to the release kinetics of cTnI, a negative result within the first hours of the onset of symptoms does not rule out myocardial infarction with certainty. If myocardial infarction is still suspected, repeat the test at appropriate intervals.   . Lactic Acid, Venous 02/21/2016 1.13  0.5 - 1.9 mmol/L Final  . Specimen Description 02/26/2016 BLOOD LEFT ANTECUBITAL   Final  . Special Requests 02/26/2016 BOTTLES DRAWN AEROBIC AND ANAEROBIC 5CC   Final  . Culture 02/26/2016 NO GROWTH 5 DAYS   Final  . Report Status 02/26/2016 02/26/2016 FINAL   Final  . Specimen Description 02/26/2016 BLOOD LEFT HAND   Final  . Special Requests 02/26/2016 BOTTLES DRAWN AEROBIC AND ANAEROBIC 5CC   Final  . Culture 02/26/2016 NO GROWTH 5 DAYS   Final  . Report Status 02/26/2016 02/26/2016 FINAL   Final  . HIV Screen 4th Generation wRfx 02/22/2016 Non Reactive  Non  Reactive Final   Comment: (NOTE) Performed At: [facility] [place], [place] [id number] Lindon Romp MD KN:[id number]   . Sodium 02/22/2016 139  135 - 145 mmol/L Final  . Potassium 02/22/2016 3.6  3.5 - 5.1 mmol/L Final  . Chloride 02/22/2016 97* 101 - 111 mmol/L Final  . CO2 02/22/2016 30  22 - 32 mmol/L Final  . Glucose, Bld 02/22/2016 138* 65 - 99 mg/dL Final  . BUN 02/22/2016 11  6 - 20 mg/dL Final  . Creatinine, Ser 02/22/2016 1.54* 0.44 - 1.00 mg/dL Final  . Calcium 02/22/2016 9.1  8.9 - 10.3 mg/dL Final  . GFR calc non Af Amer 02/22/2016 37* >60 mL/min Final  . GFR calc Af Amer 02/22/2016 43* >60 mL/min Final   Comment: (NOTE) The eGFR has been calculated using the CKD EPI equation. This calculation has not been validated in all clinical situations. eGFR's persistently <60 mL/min signify possible Chronic Kidney Disease.   . Anion gap 02/22/2016 12  5 - 15 Final  . Glucose-Capillary 02/22/2016 124* 65 - 99 mg/dL Final  . Hemoglobin 02/22/2016 10.4* 12.0 - 15.0 g/dL Final  . HCT 02/22/2016 33.3* 36.0 - 46.0 % Final  . Hemoglobin 02/22/2016 10.4* 12.0 - 15.0 g/dL Final  . HCT 02/22/2016 32.4* 36.0 - 46.0 % Final  . Hemoglobin 02/22/2016 10.6* 12.0 - 15.0 g/dL Final  . HCT 02/22/2016 33.5* 36.0 - 46.0 % Final  . Hemoglobin 02/22/2016 10.3* 12.0 - 15.0 g/dL Final  . HCT 02/22/2016 32.1* 36.0 - 46.0 % Final  . ABO/RH(D) 02/22/2016 O POS   Final  . Antibody Screen 02/22/2016 NEG   Final  . Sample Expiration 02/22/2016 02/25/2016   Final  . ABO/RH(D) 02/22/2016 O POS   Final  . Troponin I 02/22/2016 0.03* <0.03 ng/mL Final   Comment: CRITICAL RESULT CALLED TO, READ BACK BY AND VERIFIED WITH: RN I HABIB AT 1755 [id number] MARTINB   . WBC 02/23/2016 9.6  4.0 - 10.5 K/uL Final  . RBC 02/23/2016 3.61* 3.87 - 5.11 MIL/uL Final  . Hemoglobin 02/23/2016 9.8* 12.0 - 15.0 g/dL Final  . HCT 02/23/2016 31.8* 36.0 - 46.0 % Final  . MCV 02/23/2016 88.1  78.0 -  100.0 fL Final  . MCH 02/23/2016 27.1  26.0 - 34.0 pg Final  . MCHC 02/23/2016 30.8  30.0 - 36.0 g/dL Final  . RDW 02/23/2016 14.1  11.5 - 15.5 % Final  . Platelets 02/23/2016 350  150 - 400 K/uL Final  . Sodium 02/23/2016 139  135 - 145 mmol/L Final  .  Potassium 02/23/2016 3.4* 3.5 - 5.1 mmol/L Final  . Chloride 02/23/2016 101  101 - 111 mmol/L Final  . CO2 02/23/2016 29  22 - 32 mmol/L Final  . Glucose, Bld 02/23/2016 115* 65 - 99 mg/dL Final  . BUN 02/23/2016 16  6 - 20 mg/dL Final  . Creatinine, Ser 02/23/2016 1.53* 0.44 - 1.00 mg/dL Final  . Calcium 02/23/2016 8.8* 8.9 - 10.3 mg/dL Final  . GFR calc non Af Amer 02/23/2016 37* >60 mL/min Final  . GFR calc Af Amer 02/23/2016 43* >60 mL/min Final   Comment: (NOTE) The eGFR has been calculated using the CKD EPI equation. This calculation has not been validated in all clinical situations. eGFR's persistently <60 mL/min signify possible Chronic Kidney Disease.   . Anion gap 02/23/2016 9  5 - 15 Final  . WBC 02/24/2016 7.7  4.0 - 10.5 K/uL Final  . RBC 02/24/2016 3.69* 3.87 - 5.11 MIL/uL Final  . Hemoglobin 02/24/2016 10.2* 12.0 - 15.0 g/dL Final  . HCT 02/24/2016 32.8* 36.0 - 46.0 % Final  . MCV 02/24/2016 88.9  78.0 - 100.0 fL Final  . MCH 02/24/2016 27.6  26.0 - 34.0 pg Final  . MCHC 02/24/2016 31.1  30.0 - 36.0 g/dL Final  . RDW 02/24/2016 14.1  11.5 - 15.5 % Final  . Platelets 02/24/2016 350  150 - 400 K/uL Final  . Sodium 02/24/2016 140  135 - 145 mmol/L Final  . Potassium 02/24/2016 3.5  3.5 - 5.1 mmol/L Final  . Chloride 02/24/2016 104  101 - 111 mmol/L Final  . CO2 02/24/2016 27  22 - 32 mmol/L Final  . Glucose, Bld 02/24/2016 105* 65 - 99 mg/dL Final  . BUN 02/24/2016 11  6 - 20 mg/dL Final  . Creatinine, Ser 02/24/2016 1.17* 0.44 - 1.00 mg/dL Final  . Calcium 02/24/2016 8.8* 8.9 - 10.3 mg/dL Final  . GFR calc non Af Amer 02/24/2016 52* >60 mL/min Final  . GFR calc Af Amer 02/24/2016 >60  >60 mL/min Final   Comment:  (NOTE) The eGFR has been calculated using the CKD EPI equation. This calculation has not been validated in all clinical situations. eGFR's persistently <60 mL/min signify possible Chronic Kidney Disease.   . Anion gap 02/24/2016 9  5 - 15 Final  . Sodium 02/25/2016 141  135 - 145 mmol/L Final  . Potassium 02/25/2016 3.6  3.5 - 5.1 mmol/L Final  . Chloride 02/25/2016 102  101 - 111 mmol/L Final  . CO2 02/25/2016 30  22 - 32 mmol/L Final  . Glucose, Bld 02/25/2016 105* 65 - 99 mg/dL Final  . BUN 02/25/2016 11  6 - 20 mg/dL Final  . Creatinine, Ser 02/25/2016 1.11* 0.44 - 1.00 mg/dL Final  . Calcium 02/25/2016 9.0  8.9 - 10.3 mg/dL Final  . GFR calc non Af Amer 02/25/2016 55* >60 mL/min Final  . GFR calc Af Amer 02/25/2016 >60  >60 mL/min Final   Comment: (NOTE) The eGFR has been calculated using the CKD EPI equation. This calculation has not been validated in all clinical situations. eGFR's persistently <60 mL/min signify possible Chronic Kidney Disease.   . Anion gap 02/25/2016 9  5 - 15 Final  Admission on 02/13/2016, Discharged on 02/20/2016  Component Date Value Ref Range Status  . Glucose-Capillary 02/13/2016 140* 65 - 99 mg/dL Final  . Glucose-Capillary 02/13/2016 133* 65 - 99 mg/dL Final  . Comment 1 02/13/2016 Notify RN   Final  . Comment 2 02/13/2016  Document in Chart   Final  . MRSA by PCR 02/13/2016 NEGATIVE  NEGATIVE Final   Comment:        The GeneXpert MRSA Assay (FDA approved for NASAL specimens only), is one component of a comprehensive MRSA colonization surveillance program. It is not intended to diagnose MRSA infection nor to guide or monitor treatment for MRSA infections.   . Color, Urine 02/16/2016 YELLOW  YELLOW Final  . APPearance 02/16/2016 HAZY* CLEAR Final  . Specific Gravity, Urine 02/16/2016 1.015  1.005 - 1.030 Final  . pH 02/16/2016 5.5  5.0 - 8.0 Final  . Glucose, UA 02/16/2016 NEGATIVE  NEGATIVE mg/dL Final  . Hgb urine dipstick 02/16/2016  NEGATIVE  NEGATIVE Final  . Bilirubin Urine 02/16/2016 NEGATIVE  NEGATIVE Final  . Ketones, ur 02/16/2016 NEGATIVE  NEGATIVE mg/dL Final  . Protein, ur 02/16/2016 NEGATIVE  NEGATIVE mg/dL Final  . Nitrite 02/16/2016 NEGATIVE  NEGATIVE Final  . Leukocytes, UA 02/16/2016 NEGATIVE  NEGATIVE Final  . Amylase 02/16/2016 40  28 - 100 U/L Final  . Lipase 02/16/2016 19  11 - 51 U/L Final  . Sodium 02/16/2016 140  135 - 145 mmol/L Final  . Potassium 02/16/2016 3.8  3.5 - 5.1 mmol/L Final  . Chloride 02/16/2016 105  101 - 111 mmol/L Final  . CO2 02/16/2016 27  22 - 32 mmol/L Final  . Glucose, Bld 02/16/2016 136* 65 - 99 mg/dL Final  . BUN 02/16/2016 8  6 - 20 mg/dL Final  . Creatinine, Ser 02/16/2016 1.03* 0.44 - 1.00 mg/dL Final  . Calcium 02/16/2016 9.0  8.9 - 10.3 mg/dL Final  . Total Protein 02/16/2016 6.6  6.5 - 8.1 g/dL Final  . Albumin 02/16/2016 3.0* 3.5 - 5.0 g/dL Final  . AST 02/16/2016 27  15 - 41 U/L Final  . ALT 02/16/2016 31  14 - 54 U/L Final  . Alkaline Phosphatase 02/16/2016 83  38 - 126 U/L Final  . Total Bilirubin 02/16/2016 0.7  0.3 - 1.2 mg/dL Final  . GFR calc non Af Amer 02/16/2016 >60  >60 mL/min Final  . GFR calc Af Amer 02/16/2016 >60  >60 mL/min Final   Comment: (NOTE) The eGFR has been calculated using the CKD EPI equation. This calculation has not been validated in all clinical situations. eGFR's persistently <60 mL/min signify possible Chronic Kidney Disease.   . Anion gap 02/16/2016 8  5 - 15 Final  . WBC 02/16/2016 14.5* 4.0 - 10.5 K/uL Final  . RBC 02/16/2016 3.85* 3.87 - 5.11 MIL/uL Final  . Hemoglobin 02/16/2016 10.8* 12.0 - 15.0 g/dL Final  . HCT 02/16/2016 34.5* 36.0 - 46.0 % Final  . MCV 02/16/2016 89.6  78.0 - 100.0 fL Final  . MCH 02/16/2016 28.1  26.0 - 34.0 pg Final  . MCHC 02/16/2016 31.3  30.0 - 36.0 g/dL Final  . RDW 02/16/2016 14.9  11.5 - 15.5 % Final  . Platelets 02/16/2016 309  150 - 400 K/uL Final  . Glucose-Capillary 02/17/2016 132* 65  - 99 mg/dL Final  . Comment 1 02/17/2016 Notify RN   Final  . Glucose-Capillary 02/18/2016 133* 65 - 99 mg/dL Final  . Comment 1 02/18/2016 Notify RN   Final  . Glucose-Capillary 02/18/2016 120* 65 - 99 mg/dL Final  . Glucose-Capillary 02/18/2016 131* 65 - 99 mg/dL Final  . Comment 1 02/18/2016 Notify RN   Final  . WBC 02/19/2016 9.9  4.0 - 10.5 K/uL Final  . RBC 02/19/2016 3.54* 3.87 -  5.11 MIL/uL Final  . Hemoglobin 02/19/2016 9.7* 12.0 - 15.0 g/dL Final  . HCT 02/19/2016 31.7* 36.0 - 46.0 % Final  . MCV 02/19/2016 89.5  78.0 - 100.0 fL Final  . MCH 02/19/2016 27.4  26.0 - 34.0 pg Final  . MCHC 02/19/2016 30.6  30.0 - 36.0 g/dL Final  . RDW 02/19/2016 14.3  11.5 - 15.5 % Final  . Platelets 02/19/2016 331  150 - 400 K/uL Final  . Glucose-Capillary 02/18/2016 129* 65 - 99 mg/dL Final  . Glucose-Capillary 02/18/2016 97  65 - 99 mg/dL Final  . Glucose-Capillary 02/19/2016 142* 65 - 99 mg/dL Final  . Glucose-Capillary 02/19/2016 112* 65 - 99 mg/dL Final  . Comment 1 02/19/2016 Notify RN   Final  . Glucose-Capillary 02/19/2016 102* 65 - 99 mg/dL Final  . Comment 1 02/19/2016 Notify RN   Final  . Glucose-Capillary 02/19/2016 104* 65 - 99 mg/dL Final  . Glucose-Capillary 02/20/2016 131* 65 - 99 mg/dL Final  . Glucose-Capillary 02/20/2016 128* 65 - 99 mg/dL Final  Hospital Outpatient Visit on 02/05/2016  Component Date Value Ref Range Status  . Glucose-Capillary 02/05/2016 104* 65 - 99 mg/dL Final  . Sodium 02/05/2016 140  135 - 145 mmol/L Final  . Potassium 02/05/2016 3.8  3.5 - 5.1 mmol/L Final  . Chloride 02/05/2016 111  101 - 111 mmol/L Final  . CO2 02/05/2016 21* 22 - 32 mmol/L Final  . Glucose, Bld 02/05/2016 108* 65 - 99 mg/dL Final  . BUN 02/05/2016 14  6 - 20 mg/dL Final  . Creatinine, Ser 02/05/2016 1.17* 0.44 - 1.00 mg/dL Final  . Calcium 02/05/2016 9.4  8.9 - 10.3 mg/dL Final  . GFR calc non Af Amer 02/05/2016 52* >60 mL/min Final  . GFR calc Af Amer 02/05/2016 >60  >60  mL/min Final   Comment: (NOTE) The eGFR has been calculated using the CKD EPI equation. This calculation has not been validated in all clinical situations. eGFR's persistently <60 mL/min signify possible Chronic Kidney Disease.   . Anion gap 02/05/2016 8  5 - 15 Final  . WBC 02/05/2016 7.9  4.0 - 10.5 K/uL Final  . RBC 02/05/2016 4.52  3.87 - 5.11 MIL/uL Final  . Hemoglobin 02/05/2016 12.8  12.0 - 15.0 g/dL Final  . HCT 02/05/2016 38.9  36.0 - 46.0 % Final  . MCV 02/05/2016 86.1  78.0 - 100.0 fL Final  . MCH 02/05/2016 28.3  26.0 - 34.0 pg Final  . MCHC 02/05/2016 32.9  30.0 - 36.0 g/dL Final  . RDW 02/05/2016 14.4  11.5 - 15.5 % Final  . Platelets 02/05/2016 321  150 - 400 K/uL Final  . Hgb A1c MFr Bld 02/06/2016 6.1* 4.8 - 5.6 % Final   Comment: (NOTE)         Pre-diabetes: 5.7 - 6.4         Diabetes: >6.4         Glycemic control for adults with diabetes: <7.0   . Mean Plasma Glucose 02/06/2016 128  mg/dL Final   Comment: (NOTE) Performed At: [facility] [place], [place] [id number] Lindon Romp MD [id number]     Mr Brain Wo Contrast  Result Date: 04/13/2016 CLINICAL DATA:  Weakness, greater on the right side. EXAM: MRI HEAD WITHOUT CONTRAST TECHNIQUE: Multiplanar, multiecho pulse sequences of the brain and surrounding structures were obtained without intravenous contrast. COMPARISON:  MRI 09/24/2014 FINDINGS: Brain: There is no evidence of acute infarct, intracranial hemorrhage,  mass, midline shift, or extra-axial fluid collection. Ventricles and sulci are within normal limits. Patchy T2 hyperintensities throughout the cerebral white matter bilaterally as well as more confluent T2 hyperintensity extending to the subcortical white matter in the left parietal lobe have not significantly changed. Vascular: Major intracranial vascular flow voids are preserved. Skull and upper cervical spine: Unremarkable bone marrow signal. Sinuses/Orbits: Unremarkable  orbits. Trace paranasal sinus mucosal thickening. Clear mastoid air cells. Other: None. IMPRESSION: 1. No acute intracranial abnormality. 2. Moderate cerebral white matter disease, unchanged and nonspecific though may reflect age advanced chronic small vessel ischemia. Electronically Signed   By: Logan Bores M.D.   On: 04/13/2016 14:21   Mr Lumbar Spine Wo Contrast  Result Date: 04/13/2016 CLINICAL DATA:  Right-sided weakness, pain EXAM: MRI LUMBAR SPINE WITHOUT CONTRAST TECHNIQUE: Multiplanar, multisequence MR imaging of the lumbar spine was performed. No intravenous contrast was administered. COMPARISON:  None. FINDINGS: Segmentation:  Standard. Alignment:  Physiologic. Vertebrae:  No fracture, evidence of discitis, or bone lesion. Conus medullaris: Extends to the L1 level and appears normal. Paraspinal and other soft tissues: No paraspinal abnormality. Disc levels: Disc spaces: Degenerative disc disease with disc height loss and Modic endplate changes at B4-W9. Disc desiccation at L4-5. T12-L1: No significant disc bulge. No evidence of neural foraminal stenosis. No central canal stenosis. L1-L2: No significant disc bulge. No evidence of neural foraminal stenosis. No central canal stenosis. L2-L3: No significant disc bulge. No evidence of neural foraminal stenosis. No central canal stenosis. L3-L4: No significant disc bulge. No evidence of neural foraminal stenosis. No central canal stenosis. L4-L5: Mild broad-based disc bulge. Moderate left and mild right facet arthropathy. No evidence of neural foraminal stenosis. No central canal stenosis. L5-S1: Broad-based disc bulge. Moderate bilateral facet arthropathy. No evidence of neural foraminal stenosis. No central canal stenosis. IMPRESSION: 1. At L4-5 there is a mild broad-based disc bulge. Moderate left and mild right facet arthropathy. 2. At L5-S1 there is a broad-based disc bulge. Moderate bilateral facet arthropathy. Electronically Signed   By: Kathreen Devoid   On: 04/13/2016 14:35   Dg Pelvis Portable  Result Date: 04/13/2016 CLINICAL DATA:  Patient woke up yesterday with pain all over. No known injury. EXAM: PORTABLE PELVIS 1-2 VIEWS COMPARISON:  CT pelvis 07/06/2015 FINDINGS: There is no evidence of pelvic fracture or diastasis. No pelvic bone lesions are seen. IMPRESSION: Negative. Electronically Signed   By: Lucienne Capers M.D.   On: 04/13/2016 05:54   Dg Chest Portable 1 View  Result Date: 04/12/2016 CLINICAL DATA:  Productive cough since September 15. Chest pain and shortness of breath on exertion. Pain radiates to the right arm with numbness and tingling in the right hand. EXAM: PORTABLE CHEST 1 VIEW COMPARISON:  03/18/2016 FINDINGS: Tracheostomy. Shallow inspiration. Mild cardiac enlargement without vascular congestion. No focal airspace disease or consolidation. No blunting of costophrenic angles. No pneumothorax. Tortuous aorta. IMPRESSION: No active disease. Electronically Signed   By: Lucienne Capers M.D.   On: 04/12/2016 23:30     Assessment/Plan   ICD-9-CM ICD-10-CM   1. Right knee pain, unspecified chronicity 719.46 M25.561   2. Physical deconditioning 799.3 R53.81   3. Polyneuropathy (HCC) 356.9 G62.9   4. Fibromyalgia 729.1 M79.7   5. 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 Other iron deficiency anemia 280.8 D50.8   7. Type 2 diabetes mellitus with diabetic polyneuropathy, unspecified long term insulin use status (HCC) 250.60 E11.42    357.2    8. Essential hypertension  401.9 I10   9. Morbid obesity due to excess calories ([place]) 278.01 E66.01   10. Anxiety with depression 300.4 F41.8   11. Urinary urgency 788.63 R39.15     Start Tramadol 64m q6hr prn mod-sev pain  Urology eval for urinary urgency and possible bladder prolapse  Check right knee xray for pain and swelling  Cont other meds as ordered  PT/OT/ST as ordered  Fall precautions  Trach care as indicated  GOAL: short term rehab and d/c home  when medically appropriate. Communicated with pt and nursing.  Will follow  Mj Willis S. CPerlie Gold PBeacon Orthopaedics Surgery Centerand Adult Medicine [address] Bank StreetGMenno [place]omp[place] [phone] (Monday-Friday 8 AM - 5 PM) ([phone] 5 PM and follow prompts

## 2016-04-30 ENCOUNTER — Encounter: Payer: Self-pay | Admitting: Adult Health

## 2016-04-30 ENCOUNTER — Non-Acute Institutional Stay (SKILLED_NURSING_FACILITY): Payer: Medicaid Other | Admitting: Adult Health

## 2016-04-30 DIAGNOSIS — K580 Irritable bowel syndrome with diarrhea: Secondary | ICD-10-CM

## 2016-04-30 DIAGNOSIS — F411 Generalized anxiety disorder: Secondary | ICD-10-CM

## 2016-04-30 DIAGNOSIS — M797 Fibromyalgia: Secondary | ICD-10-CM

## 2016-04-30 NOTE — Progress Notes (Signed)
Patient ID: Yesenia [name], female   DOB: June 06, 1961, 55 y.o.   MRN: [id number]   Location:   [facility] Room Number: 116-A Place of Service:  SNF (31)   CODE STATUS: Full Code  Allergies  Allergen Reactions  . Buprenorphine Hcl Hives  . Morphine And Related Hives and Dermatitis    Chief Complaint  Patient presents with  . Acute Visit    HPI:  She is having multiple concerns today. She is having diarrhea; has increased urine incontinence; fibromyalgia pain; and increased anxiety at night. She is wanting medications to help with her issues. There are no nursing reports of fevers; no change in her appetite; no change in her activity level.    Past Medical History:  Diagnosis Date  . Anemia   . Anemia   . Chronic pain   . CKD (chronic kidney disease)   . Diabetes mellitus without complication (HCC)    diet controlled. does not check CBG's  . Fibromyalgia    hospitilized 12/16 due to inability to walk  . Hyperlipemia   . Hypertension   . Insomnia   . Neuropathy (HCC)   . Right sided weakness   . Stroke [facility]) 2014?   no residual weakness   . Vitamin deficiency    Vit D    Past Surgical History:  Procedure Laterality Date  . KNEE SURGERY  1992, 1995, 1997  . LEG SURGERY Right 2007   Surgery x3 2 arthroscopies and one rod  . OOPHORECTOMY Right 1993?  . TRACHEOSTOMY TUBE PLACEMENT N/A 02/13/2016   Procedure: TRACHEOSTOMY;  Surgeon: Christia Reading, MD;  Location: [facility] OR;  Service: ENT;  Laterality: N/A;    Social History   Social History  . Marital status: Single    Spouse name: N/A  . Number of children: 2  . Years of education: 12th    Occupational History  . SSI    Social History Main Topics  . Smoking status: Never Smoker  . Smokeless tobacco: Never Used  . Alcohol use No  . Drug use: No  . Sexual activity: No   Other Topics Concern  . Not on file   Social History Narrative   Reports no caffeine use    Family History  Problem Relation  Age of Onset  . Breast cancer Mother   . Leukemia Brother   . Breast cancer Maternal Grandmother       VITAL SIGNS BP 132/70   Pulse 74   Temp 97.6 F (36.4 C) (Oral)   Resp 20   Ht 5\' 7"  (1.702 m)   Wt (!) 330 lb (149.7 kg)   SpO2 94%   BMI 51.69 kg/m   Patient's Medications  New Prescriptions   No medications on file  Previous Medications   ALBUTEROL (PROVENTIL HFA;VENTOLIN HFA) 108 (90 BASE) MCG/ACT INHALER    Inhale 2 puffs into the lungs every 6 (six) hours as needed for wheezing.   AMLODIPINE (NORVASC) 10 MG TABLET    Take 10 mg by mouth daily.   ATENOLOL (TENORMIN) 50 MG TABLET    Take 50 mg by mouth daily.   BUSPIRONE (BUSPAR) 15 MG TABLET    Take 15 mg by mouth 2 (two) times daily.   CLONAZEPAM (KLONOPIN) 0.5 MG TABLET    Take one tablet by mouth once daily at bedtime   ESCITALOPRAM (LEXAPRO) 10 MG TABLET    Take 10 mg by mouth daily.    FERROUS SULFATE 325 (65 FE) MG  TABLET    Take 325 mg by mouth daily.   GLIPIZIDE (GLUCOTROL XL) 2.5 MG 24 HR TABLET    Take 1 tablet (2.5 mg total) by mouth daily with breakfast.   HYDROCHLOROTHIAZIDE (HYDRODIURIL) 25 MG TABLET    Take 0.5 tablets (12.5 mg total) by mouth daily.   HYOSCYAMINE (ANASPAZ) 0.125 MG TBDP DISINTERGRATING TABLET    Place 0.125 mg under the tongue 3 (three) times daily as needed. For Gas-esophageal  Reflux   LOPERAMIDE (IMODIUM) 2 MG CAPSULE    Take 2 mg by mouth 3 (three) times daily before meals.   ONDANSETRON (ZOFRAN) 4 MG TABLET    Take 4 mg by mouth daily. May also give one tablet every 8 hours prn   PREGABALIN (LYRICA) 50 MG CAPSULE    Take 1 capsule (50 mg total) by mouth daily.   SACCHAROMYCES BOULARDII (FLORASTOR) 250 MG CAPSULE    Take 250 mg by mouth 2 (two) times daily.   TRAZODONE (DESYREL) 150 MG TABLET    Take 150 mg by mouth at bedtime.  Modified Medications   No medications on file  Discontinued Medications   No medications on file      SIGNIFICANT DIAGNOSTIC EXAMS  07-06-15:  bilateral hip and pelvis x-ray; Negative exam.  07-06-15: ct scan of  left hip: No evidence of fracture or dislocation. No significant joint effusion. No definite findings seen to suggest septic arthritis at this time  07-17-15: left elbow x-ray: 1. No evidence of acute fracture or dislocation. 2. Several small osseous fragments overlying the medial epicondyle, likely reflecting remote injury.  03-06-16: chest x-ray: Low volumes with some increase in bibasilar atelectasis or infiltrate.  03-07-16: chest x-ray: No acute cardiopulmonary findings.  04-12-16: chest x-ray: Tracheostomy. Shallow inspiration. Mild cardiac enlargement without vascular congestion. No focal airspace disease or consolidation. No blunting of costophrenic angles. No pneumothorax. Tortuous aorta.  04-13-16: pelvic x-ray: There is no evidence of pelvic fracture or diastasis. No pelvic bone lesions are seen.  04-13-16: mri brain: 1. No acute intracranial abnormality. 2. Moderate cerebral white matter disease, unchanged and nonspecific though may reflect age advanced chronic small vessel ischemia.   04-13-16: mri of lumbar spine: 1. At L4-5 there is a mild broad-based disc bulge. Moderate left and mild right facet arthropathy. 2. At L5-S1 there is a broad-based disc bulge. Moderate bilateral facet arthropathy.    LABS REVIEWED:   07-07-15: wbc 9.2; hgb 9.8; hct 30.7; mcv 89.5; plt 260 glucose 123; bun 17; creat 1.29; k+ 3.3; na++142; vitamin B12: 552; folate 9.2; iron 71; TIBC 253 07-09-15: wbc 7.7; hgb 9.7; hct 30.6; mcv 88.7; ;plt 290; glucose 118; bun 14; creat 0.99; k+ 3.6; na++142 07-17-15: wbc 10.0; hgb 11.3; hct 33.9; mcv 88.1; plt 318; glucose 112; bun 16; creat 1.13; k+ 3.7; na++142; sed rate 60; CRP 3.1  02-05-16: hgb a1c 6.1 03-06-16: wbc 7.1;hb 11.8; hct 36.8;mcv 88.0; plt 396; glucose 110; bun 16; creat 1.26; k+ 3.1; na++ 140; urine culture: 40,000 enterococcus 03-11-16: wbc 7.4; hgb 9.4; hct 30.2; mcv 88.3; plt 294;  glucose 106; bun 13; creat 1.11; k+ 3.9; na++ 141; liver normal albumin 2.9  04-12-16: wbc 7.9; hgb 10.8; hct 35.5; mcv 90.8; plt 319; glucose 105; bun 11; creat 1.18; k+ 3.7; na++ 143; liver normal albumin 3.2; BNP 69; blood culture: no growth 04-13-16: tsh 0.720; urine culture: multiple species 04-14-16: wbc 7.2; hgb 10.4; hct 33.8; mcv 90.6; plt 283; glucose 86; bun 12; creat 0.96; k+ 3.8;  na++ 142     Review of Systems  Constitutional: Negative for malaise/fatigue.  Respiratory: Negative for cough and shortness of breath.   Cardiovascular: Negative for chest pain, palpitations and leg swelling.  Gastrointestinal: negative for diarrhea Negative for heartburn, nausea, abdominal pain, has frequent loose stools.   Musculoskeletal: Negative for myalgias and joint pain.  Skin: Negative.   Neurological: Negative for headaches. has right side weakness Psychiatric/Behavioral: has anxiety     Physical Exam  Constitutional: She is oriented to person, place, and time. She appears well-developed and well-nourished. No distress.  Morbidly obese   Eyes: Conjunctivae are normal.  Neck: Neck supple. No JVD present. No thyromegaly present.  Has trach  Cardiovascular: Normal rate, regular rhythm and intact distal pulses.   Respiratory: Effort normal and breath sounds normal. No respiratory distress. She has no wheezes.  GI: Soft. Bowel sounds are normal. She exhibits no distension. There is no tenderness.  Musculoskeletal: She exhibits no edema.  Able to move all extremities Does have mild  right side weakness present.    Lymphadenopathy:    She has no cervical adenopathy.  Neurological: She is alert and oriented to person, place, and time.  Skin: Skin is warm and dry. She is not diaphoretic.  Psychiatric: She has a normal mood and affect.     ASSESSMENT/ PLAN:  1. Fibromyalgia; will increase lyrica to 50 mg twice daily   2. 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 Anxiety: will increase her klonopin to 1 mg nightly    Will setup a urology appointment per her request for her UI.    MD is aware of resident's narcotic use and is in agreement with current plan of care. We will attempt to wean resident as apropriate   Synthia Innocenteborah Rayah Fines NP [facility]  Contact [phone] Monday through Friday 8am- 5pm  After hours call [phone]

## 2016-05-04 ENCOUNTER — Non-Acute Institutional Stay (SKILLED_NURSING_FACILITY): Payer: Medicaid Other | Admitting: Internal Medicine

## 2016-05-04 DIAGNOSIS — R197 Diarrhea, unspecified: Secondary | ICD-10-CM

## 2016-05-04 DIAGNOSIS — G629 Polyneuropathy, unspecified: Secondary | ICD-10-CM

## 2016-05-04 DIAGNOSIS — R1032 Left lower quadrant pain: Secondary | ICD-10-CM

## 2016-05-04 NOTE — Progress Notes (Signed)
This is an acute visit.  Level care skilled.  Facility is [facility] (medical) complaint-acute visit secondary to complaints of left-sided abdominal discomfort.  History of present illness.  Patient is a pleasant 55 year old female who is complaining of somewhat chronic left-sided abdominal discomfort.  She states this is not new and somewhat chronically had been in the hospital several months ago.  I note did do an abdominal ultrasound in the hospital which showed possible fatty infiltration of the liver but otherwise no acute process.  She does complain of chronic diarrhea and actually is on Imodium apparently she got a capsule form recently and this did not agree with her stomach-which may be precipitating some of the discomfort she is no longer receiving the capsule form.  She complains of chronic diarrhea states at times he tends to run right through her after a meal.  She is also complaining of some numbness of her fingers and feet at night-she does have a history of neuropathy is on Lyrica 50 mg once a day.  Currently she is resting in bed comfortably-says at times she does not have a very good appetite-apparently eats out fairly often I did note she had take-out in her garbage can from last night.  She is not complaining of nausea or vomiting  Past Medical History:  Diagnosis Date  . Anemia   . Anemia   . Chronic pain   . CKD (chronic kidney disease)   . Diabetes mellitus without complication (HCC)    diet controlled. does not check CBG's  . Fibromyalgia    hospitilized 12/16 due to inability to walk  . Hyperlipemia   . Hypertension   . Insomnia   . Neuropathy (HCC)   . Right sided weakness   . Stroke [facility]) 2014?   no residual weakness   . Vitamin deficiency    Vit D         Past Surgical History:  Procedure Laterality Date  . KNEE SURGERY  1992, 1995, 1997  . LEG SURGERY Right 2007   Surgery x3 2 arthroscopies and one rod    . OOPHORECTOMY Right 1993?  . TRACHEOSTOMY TUBE PLACEMENT N/A 02/13/2016   Procedure: TRACHEOSTOMY;  Surgeon: Christia Reading, MD;  Location: [facility];  Service: ENT;  Laterality: N/A;    Social History        Social History  . Marital status: Single    Spouse name: N/A  . Number of children: 2  . Years of education: 12th        Occupational History  . SSI        Social History Main Topics  . Smoking status: Never Smoker  . Smokeless tobacco: Never Used  . Alcohol use No  . Drug use: No  . Sexual activity: No       Other Topics Concern  . Not on file      Social History Narrative   Reports no caffeine use         Family History  Problem Relation Age of Onset  . Breast cancer Mother   . Leukemia Brother   . Breast cancer Maternal Grandmother     Medications reviewed include.  Norvasc 10 mg a da .  BuSpar 15 mg twice a day.  Ferrous sulfate 325 mg daily.  Florist store 250 mg twice a day.  Glucotrol XL 2.5 mg every morning.  Hydrochlorothiazide 12.5 mg by mouth every morning.  Hyoscyamine 0.125 mg trans-lingually every 8 hours  when necessary.  Imodium 2 mg one capsule by mouth before meals.  Klonopin 0.5 mg daily at bedtime.  Lexapro 10 mg daily.  Lyrica 50 mg daily.  Proventil aerosol inhaler 2 puffs every 6 hours when necessary.  Atenolol 50 mg daily.  Tramadol 50 mg every 6 hours when necessary.  Trazodone 150 mg daily at bedtime.  Zofran 4 mg every 8 hours when necessary.  And Zofran 4 mg 1 tablet daily routine.  Review of systems.  In general is not complaining of any fever or chills.  Skin does not when of rashes or itching.  Head ears eyes nose mouth and throat-does have a trach says sometimes it feels a bit uncomfortable does not clear visual changes.  Respiratory is not complaining of shortness breath or significant cough.  Cardiac does not clinic chest pain.  GI is complaining of left-sided abdominal  discomfort she relates is somewhat persistent for several months also has chronic diarrhea.  Does not complain currently of nausea and vomiting but apparently does have a history of this.  Musculoskeletal is not complaining of joint pain but does complain of some numbness of her fingers and feet at night.  Neurologic not complaining of dizziness headache again numbness as noted above with a history of neuropathy.  Psych is not complaining of overt anxiety or depression does have a history of this.    Physical exam.  Temperatures 97.6 pulse 74 respirations 20 blood pressure 132/70.  Constitutional: She appears well-developed and well-nourished.  Lying in bed resting in NAD, trach intact  no swelling erythema or discharge noted HENT:  Mouth/Throat: Oropharynx is clear and moist. No oropharyngeal exudate.  Eyes: Pupils are equal, round, and reactive to light. No scleral icterus.     Cardiovascular: Regular rhythm and intact distal pulses.   Heart sounds are somewhat distant      No LE edema b/l. no calf TTP.   Pulmonary/Chest: Effort normal. No stridor. No respiratory distress shallow air entry but no overt congestion noted.  Abdominal: Soft. Bowel sounds are normal. She exhibits no distension and no mass. There is no hepatomegaly. She has some tenderness to deep palpation left sided abdomen-this does not present as acute pain with general discomfort when palpated  Musculoskeletal: . Moves extremities 4 grip strength appears to be strong bilaterally perhaps a bit stronger on the left versus the right.    Neurological: She is alert. Moves all extremities-touch sensation is intact lower extremities feet as well as hands Skin: Skin is warm and dry. No rash noted.  Psychiatric: She has a normal mood and affect. Her behavior is normal. Thought content normal.    Labs.  04/14/2016.  WBC 7.2 hemoglobin 10.4 platelets 283  Sodium 142 potassium 3.8 BUN 12 creatinine  0.96  04/13/2016-CK level was 103  04/12/2016 -liver function tests within normal limits except albumin    of 3.2    Assessment and plan.  #1 left-sided abdominal discomfort-this appears to be more of a chronic situation I did review hospital labs as well as ultrasound which showed possible some fatty infiltration of the liver otherwise no acute process-she continues complaining of some diarrhea she is on Imodium-Will start low-dose Questran 4 mg a day we may titrate this up if tolerated well.  Also will update a CBC CMP amylase and lipase.  #2 history of peripheral neuropathy she is on Lyrica at this point will monitor before increasing dose of Lyrica or adding another agent continue to monitor for continued  symptoms.  [id number]

## 2016-05-11 ENCOUNTER — Encounter (HOSPITAL_COMMUNITY): Payer: Self-pay | Admitting: *Deleted

## 2016-05-11 ENCOUNTER — Emergency Department (HOSPITAL_COMMUNITY): Payer: Medicaid Other

## 2016-05-11 ENCOUNTER — Emergency Department (HOSPITAL_COMMUNITY)
Admission: EM | Admit: 2016-05-11 | Discharge: 2016-05-12 | Disposition: A | Discharge status: Home / Self Care | Payer: Medicaid Other | Attending: Emergency Medicine | Admitting: Emergency Medicine

## 2016-05-11 DIAGNOSIS — E1140 Type 2 diabetes mellitus with diabetic neuropathy, unspecified: Secondary | ICD-10-CM | POA: Insufficient documentation

## 2016-05-11 DIAGNOSIS — I129 Hypertensive chronic kidney disease with stage 1 through stage 4 chronic kidney disease, or unspecified chronic kidney disease: Secondary | ICD-10-CM | POA: Insufficient documentation

## 2016-05-11 DIAGNOSIS — E1122 Type 2 diabetes mellitus with diabetic chronic kidney disease: Secondary | ICD-10-CM | POA: Insufficient documentation

## 2016-05-11 DIAGNOSIS — R109 Unspecified abdominal pain: Secondary | ICD-10-CM | POA: Insufficient documentation

## 2016-05-11 DIAGNOSIS — R0602 Shortness of breath: Secondary | ICD-10-CM

## 2016-05-11 DIAGNOSIS — Z7984 Long term (current) use of oral hypoglycemic drugs: Secondary | ICD-10-CM | POA: Insufficient documentation

## 2016-05-11 DIAGNOSIS — N183 Chronic kidney disease, stage 3 (moderate): Secondary | ICD-10-CM | POA: Insufficient documentation

## 2016-05-11 DIAGNOSIS — Z8673 Personal history of transient ischemic attack (TIA), and cerebral infarction without residual deficits: Secondary | ICD-10-CM | POA: Diagnosis not present

## 2016-05-11 MED ORDER — SODIUM CHLORIDE 0.9 % IV BOLUS (SEPSIS)
500.0000 mL | Freq: Once | INTRAVENOUS | Status: AC
  Administered 2016-05-12: 500 mL via INTRAVENOUS

## 2016-05-11 NOTE — Progress Notes (Signed)
Pt arrival EMS. Per EMS stated that patient has been c/o SOB after which her trach was changed today. Pt states that she has been having some left sided chest pain when she takes a deep breath in. SATs are stable at this time. CO2 detector was placed since patient has been c/o SOB after new trach placement. Positive color change. No distress noted. Pt has a 4 cuffless trach. Trach collar o2 humidity provided. RT will monitor patient status. RN bedside.

## 2016-05-11 NOTE — ED Provider Notes (Signed)
[facility]-EMERGENCY DEPT Provider Note   CSN: [id number] Arrival date & time: 05/11/16  2141     History   Chief Complaint Chief Complaint  Patient presents with  . Respiratory Distress    HPI Yesenia [name] is a 55 y.o. female.  Patient with a history of severe OSA requiring tracheostomy, HTN, HLD, fibromyalgia, DM CKD, resident at [facility] presents with c/o SOB today unrelieved with repeated nebulizer treatments throughout today. She also notes bloody discharge from trach area. No fever, "but I never run a fever". She also complains of upper abdominal and right flank pain. She reports a history of recurrent UTI/pyelonephritis. She reports chronic nausea that is unchanged today.    The history is provided by the patient. No language interpreter was used.    Past Medical History:  Diagnosis Date  . Anemia   . Anemia   . Chronic pain   . CKD (chronic kidney disease)   . Diabetes mellitus without complication (HCC)    diet controlled. does not check CBG's  . Fibromyalgia    hospitilized 12/16 due to inability to walk  . Hyperlipemia   . Hypertension   . Insomnia   . Neuropathy (HCC)   . Right sided weakness   . Stroke [facility](HCC) 2014?   no residual weakness   . Vitamin deficiency    Vit D    Patient Active Problem List   Diagnosis Date Noted  . Tracheostomy dependent (HCC)   . Generalized weakness 04/13/2016  . Weakness generalized 04/13/2016  . HCAP (healthcare-associated pneumonia) 03/06/2016  . Fibromyalgia 02/22/2016  . Cellulitis 02/21/2016  . Abdominal pain 02/16/2016  . Constipation 02/16/2016  . CKD (chronic kidney disease) stage 3, GFR 30-59 ml/min 02/16/2016  . Fever   . Cough   . Sleep apnea 02/13/2016  . Insomnia 08/10/2015  . Polyneuropathy (HCC) 07/10/2015  . GERD (gastroesophageal reflux disease) 07/10/2015  . Acute kidney injury superimposed on chronic kidney disease (HCC) 07/07/2015  . Gait disturbance 07/07/2015  . Morbid obesity (HCC)  07/07/2015  . Left hip pain 07/06/2015  . Chronic pain   . Right sided weakness   . Headache(784.0) 04/14/2013  . Encephalopathy acute 04/11/2013  . Acute pyelonephritis 04/11/2013  . Diabetes mellitus, type 2 (HCC) 04/11/2013  . Dyslipidemia 04/11/2013  . Chest pain 02/15/2013  . Cerebral infarction (HCC) 01/14/2013  . HTN (hypertension) 01/14/2013  . Anemia 01/14/2013    Past Surgical History:  Procedure Laterality Date  . KNEE SURGERY  1992, 1995, 1997  . LEG SURGERY Right 2007   Surgery x3 2 arthroscopies and one rod  . OOPHORECTOMY Right 1993?  . TRACHEOSTOMY TUBE PLACEMENT N/A 02/13/2016   Procedure: TRACHEOSTOMY;  Surgeon: Christia Readingwight Bates, MD;  [facility];  Service: ENT;  Laterality: N/A;    OB History    No data available       Home Medications    Prior to Admission medications   Medication Sig Start Date End Date Taking? Authorizing Provider  albuterol (PROVENTIL HFA;VENTOLIN HFA) 108 (90 BASE) MCG/ACT inhaler Inhale 2 puffs into the lungs every 6 (six) hours as needed for wheezing.   Yes Historical Provider, MD  amLODipine (NORVASC) 10 MG tablet Take 10 mg by mouth daily. 02/23/16  Yes Historical Provider, MD  atenolol (TENORMIN) 50 MG tablet Take 50 mg by mouth daily. 02/16/16  Yes Historical Provider, MD  busPIRone (BUSPAR) 15 MG tablet Take 15 mg by mouth 2 (two) times daily.   Yes Historical Provider, MD  cholestyramine (QUESTRAN) 4 g packet Take 4 g by mouth daily.   Yes Historical Provider, MD  clonazePAM (KLONOPIN) 0.5 MG tablet Take one tablet by mouth once daily at bedtime Patient taking differently: Take 0.5 mg by mouth at bedtime. Take one tablet by mouth once daily at bedtime 04/16/16  Yes Kirt Boys, DO  escitalopram (LEXAPRO) 10 MG tablet Take 10 mg by mouth daily.    Yes Historical Provider, MD  ferrous sulfate 325 (65 FE) MG tablet Take 325 mg by mouth daily. 09/13/14  Yes Historical Provider, MD  glipiZIDE (GLUCOTROL XL) 2.5 MG 24 hr tablet Take 1  tablet (2.5 mg total) by mouth daily with breakfast. 03/11/16  Yes Rhetta Mura, MD  hydrochlorothiazide (HYDRODIURIL) 25 MG tablet Take 0.5 tablets (12.5 mg total) by mouth daily. 03/11/16  Yes Rhetta Mura, MD  hyoscyamine (ANASPAZ) 0.125 MG TBDP disintergrating tablet Place 0.125 mg under the tongue 3 (three) times daily as needed. For Gas-esophageal  Reflux   Yes Historical Provider, MD  loperamide (IMODIUM) 2 MG capsule Take 2 mg by mouth 3 (three) times daily before meals.   Yes Historical Provider, MD  ondansetron (ZOFRAN) 4 MG tablet Take 4 mg by mouth daily. May also give one tablet every 8 hours prn   Yes Historical Provider, MD  pregabalin (LYRICA) 50 MG capsule Take 1 capsule (50 mg total) by mouth daily. 04/15/16  Yes Hollice Espy, MD  saccharomyces boulardii (FLORASTOR) 250 MG capsule Take 250 mg by mouth 2 (two) times daily.   Yes Historical Provider, MD  traMADol (ULTRAM) 50 MG tablet Take 50 mg by mouth every 6 (six) hours as needed.   Yes Historical Provider, MD  traZODone (DESYREL) 150 MG tablet Take 150 mg by mouth at bedtime.   Yes Historical Provider, MD    Family History Family History  Problem Relation Age of Onset  . Breast cancer Mother   . Leukemia Brother   . Breast cancer Maternal Grandmother     Social History Social History  Substance Use Topics  . Smoking status: Never Smoker  . Smokeless tobacco: Never Used  . Alcohol use No     Allergies   Buprenorphine hcl and Morphine and related   Review of Systems Review of Systems  Constitutional: Positive for chills. Negative for fever.  HENT: Negative.  Negative for congestion.   Respiratory: Positive for shortness of breath.        See HPI.  Cardiovascular: Negative.   Gastrointestinal: Positive for abdominal pain and nausea.  Genitourinary: Positive for flank pain.  Musculoskeletal: Positive for back pain.  Skin: Negative.  Negative for color change and rash.  Neurological:  Negative.  Negative for weakness and light-headedness.     Physical Exam Updated Vital Signs BP 96/62   Pulse 66   Temp 98 F (36.7 C) (Oral)   Resp 17   Ht 5\' 7"  (1.702 m)   Wt (!) 149.7 kg   SpO2 95%   BMI 51.69 kg/m   Physical Exam  Constitutional: She is oriented to person, place, and time. She appears well-developed and well-nourished.  HENT:  Head: Normocephalic.  Tracheostomy site unremarkable. No redness or current bleeding at site.   Neck: Normal range of motion. Neck supple.  Cardiovascular: Normal rate and regular rhythm.   No murmur heard. Pulmonary/Chest: Effort normal and breath sounds normal. She has no wheezes. She has no rales.  Abdominal: Soft. Bowel sounds are normal. There is tenderness (tander across upper abdomen.).  There is no rebound and no guarding.  Genitourinary:  Genitourinary Comments: Right CVA tenderness.  Musculoskeletal: Normal range of motion.  Neurological: She is alert and oriented to person, place, and time.  Skin: Skin is warm and dry. No rash noted.  Psychiatric: She has a normal mood and affect.     ED Treatments / Results  Labs (all labs ordered are listed, but only abnormal results are displayed) Labs Reviewed  URINE CULTURE  URINALYSIS, ROUTINE W REFLEX MICROSCOPIC (NOT AT [facility])  COMPREHENSIVE METABOLIC PANEL  CBC WITH DIFFERENTIAL/PLATELET   Results for orders placed or performed during the hospital encounter of 05/11/16  Urinalysis, Routine w reflex microscopic  Result Value Ref Range   Color, Urine YELLOW YELLOW   APPearance CLOUDY (A) CLEAR   Specific Gravity, Urine 1.022 1.005 - 1.030   pH 5.5 5.0 - 8.0   Glucose, UA NEGATIVE NEGATIVE mg/dL   Hgb urine dipstick LARGE (A) NEGATIVE   Bilirubin Urine NEGATIVE NEGATIVE   Ketones, ur NEGATIVE NEGATIVE mg/dL   Protein, ur NEGATIVE NEGATIVE mg/dL   Nitrite NEGATIVE NEGATIVE   Leukocytes, UA NEGATIVE NEGATIVE  Comprehensive metabolic panel  Result Value Ref Range    Sodium 142 135 - 145 mmol/L   Potassium 3.6 3.5 - 5.1 mmol/L   Chloride 108 101 - 111 mmol/L   CO2 26 22 - 32 mmol/L   Glucose, Bld 90 65 - 99 mg/dL   BUN 17 6 - 20 mg/dL   Creatinine, Ser 8.111.23 (H) 0.44 - 1.00 mg/dL   Calcium 8.8 (L) 8.9 - 10.3 mg/dL   Total Protein 6.9 6.5 - 8.1 g/dL   Albumin 3.5 3.5 - 5.0 g/dL   AST 18 15 - 41 U/L   ALT 15 14 - 54 U/L   Alkaline Phosphatase 62 38 - 126 U/L   Total Bilirubin 0.3 0.3 - 1.2 mg/dL   GFR calc non Af Amer 49 (L) >60 mL/min   GFR calc Af Amer 57 (L) >60 mL/min   Anion gap 8 5 - 15  CBC with Differential  Result Value Ref Range   WBC 8.3 4.0 - 10.5 K/uL   RBC 4.07 3.87 - 5.11 MIL/uL   Hemoglobin 11.4 (L) 12.0 - 15.0 g/dL   HCT 91.435.9 (L) 78.236.0 - 95.646.0 %   MCV 88.2 78.0 - 100.0 fL   MCH 28.0 26.0 - 34.0 pg   MCHC 31.8 30.0 - 36.0 g/dL   RDW 21.314.1 08.611.5 - 57.815.5 %   Platelets 309 150 - 400 K/uL   Neutrophils Relative % 49 %   Neutro Abs 4.1 1.7 - 7.7 K/uL   Lymphocytes Relative 40 %   Lymphs Abs 3.3 0.7 - 4.0 K/uL   Monocytes Relative 7 %   Monocytes Absolute 0.6 0.1 - 1.0 K/uL   Eosinophils Relative 4 %   Eosinophils Absolute 0.3 0.0 - 0.7 K/uL   Basophils Relative 0 %   Basophils Absolute 0.0 0.0 - 0.1 K/uL  Urine microscopic-add on  Result Value Ref Range   Squamous Epithelial / LPF 0-5 (A) NONE SEEN   WBC, UA NONE SEEN 0 - 5 WBC/hpf   RBC / HPF 6-30 0 - 5 RBC/hpf   Bacteria, UA RARE (A) NONE SEEN     EKG  EKG Interpretation None       Radiology Dg Chest 2 View  Result Date: 05/11/2016 CLINICAL DATA:  Acute onset of right-sided chest pain and shortness of breath. Respiratory difficulty. Productive cough and  nausea. Initial encounter. EXAM: CHEST  2 VIEW COMPARISON:  Chest radiograph performed 04/12/2016 FINDINGS: The lungs are well-aerated. Pulmonary vascularity is at the upper limits of normal. Minimal bibasilar atelectasis is seen. There is no evidence of focal opacification, pleural effusion or pneumothorax. The heart  is mildly enlarged. No acute osseous abnormalities are seen. A tracheostomy tube is noted ending 7 cm above the carina. IMPRESSION: Minimal bibasilar atelectasis noted. Lungs otherwise grossly clear. Mild cardiomegaly. Electronically Signed   By: Roanna Raider M.D.   On: 05/11/2016 22:30    Procedures Procedures (including critical care time)  Medications Ordered in ED Medications  sodium chloride 0.9 % bolus 500 mL (not administered)     Initial Impression / Assessment and Plan / ED Course  I have reviewed the triage vital signs and the nursing notes.  Pertinent labs & imaging results that were available during my care of the patient were reviewed by me and considered in my medical decision making (see chart for details).  Clinical Course    Patient presents with complaint of SOB unrelieved with multiple breathing treatments today. No fever. She has abdominal pain to right flank with history of recurrent UTI and pyelonephritis.  She had a breathing treatment on arrival and has not needed another one for duration of ED visit. O2 saturations normal. Tracheostomy evaluated by RT wtihout bleeding and minimal mucus. Of note, the patient had the trach replaced earlier today which may account for the bleeding seen at the site. CXR clear - no PNA or other abnormality. No fever, tachycardia, tachypnea. No evidence infection.  Urine shows hematuria, present on previous urinalyses. No infection. There has been no worsening nausea nor any vomiting.   She is felt stable for discharge back to [facility] for continuation of care.  Final Clinical Impressions(s) / ED Diagnoses   Final diagnoses:  None   1. SOB 2. Abdominal pain  New Prescriptions New Prescriptions   No medications on file     Elpidio Anis, PA-C 05/28/16 1610    Shon Baton, MD 05/29/16 2300

## 2016-05-11 NOTE — ED Triage Notes (Signed)
Patient presents via EMS [facility].  Patient had her trach changed out today about noon.  This afternoon she started c/o left upper abd and back pain that increases with movement and deep breathing.  Was dx with pneumonia last month and states she finished the antibiotics.  Upon arrival to the ED sats on RA 100%

## 2016-05-12 ENCOUNTER — Non-Acute Institutional Stay (SKILLED_NURSING_FACILITY): Payer: Medicaid Other | Admitting: Adult Health

## 2016-05-12 ENCOUNTER — Encounter: Payer: Self-pay | Admitting: Adult Health

## 2016-05-12 DIAGNOSIS — R1084 Generalized abdominal pain: Secondary | ICD-10-CM

## 2016-05-12 LAB — CBC WITH DIFFERENTIAL/PLATELET
Basophils Absolute: 0 10*3/uL (ref 0.0–0.1)
Basophils Relative: 0 %
EOS PCT: 4 %
Eosinophils Absolute: 0.3 10*3/uL (ref 0.0–0.7)
HCT: 35.9 % — ABNORMAL LOW (ref 36.0–46.0)
HEMOGLOBIN: 11.4 g/dL — AB (ref 12.0–15.0)
LYMPHS ABS: 3.3 10*3/uL (ref 0.7–4.0)
LYMPHS PCT: 40 %
MCH: 28 pg (ref 26.0–34.0)
MCHC: 31.8 g/dL (ref 30.0–36.0)
MCV: 88.2 fL (ref 78.0–100.0)
Monocytes Absolute: 0.6 10*3/uL (ref 0.1–1.0)
Monocytes Relative: 7 %
Neutro Abs: 4.1 10*3/uL (ref 1.7–7.7)
Neutrophils Relative %: 49 %
Platelets: 309 10*3/uL (ref 150–400)
RBC: 4.07 MIL/uL (ref 3.87–5.11)
RDW: 14.1 % (ref 11.5–15.5)
WBC: 8.3 10*3/uL (ref 4.0–10.5)

## 2016-05-12 LAB — COMPREHENSIVE METABOLIC PANEL
ALBUMIN: 3.5 g/dL (ref 3.5–5.0)
ALT: 15 U/L (ref 14–54)
AST: 18 U/L (ref 15–41)
Alkaline Phosphatase: 62 U/L (ref 38–126)
Anion gap: 8 (ref 5–15)
BUN: 17 mg/dL (ref 6–20)
CHLORIDE: 108 mmol/L (ref 101–111)
CO2: 26 mmol/L (ref 22–32)
Calcium: 8.8 mg/dL — ABNORMAL LOW (ref 8.9–10.3)
Creatinine, Ser: 1.23 mg/dL — ABNORMAL HIGH (ref 0.44–1.00)
GFR calc Af Amer: 57 mL/min — ABNORMAL LOW (ref >60)
GFR, EST NON AFRICAN AMERICAN: 49 mL/min — AB (ref >60)
Glucose, Bld: 90 mg/dL (ref 65–99)
POTASSIUM: 3.6 mmol/L (ref 3.5–5.1)
SODIUM: 142 mmol/L (ref 135–145)
Total Bilirubin: 0.3 mg/dL (ref 0.3–1.2)
Total Protein: 6.9 g/dL (ref 6.5–8.1)

## 2016-05-12 LAB — URINALYSIS, ROUTINE W REFLEX MICROSCOPIC
Bilirubin Urine: NEGATIVE
GLUCOSE, UA: NEGATIVE mg/dL
KETONES UR: NEGATIVE mg/dL
LEUKOCYTES UA: NEGATIVE
Nitrite: NEGATIVE
PH: 5.5 (ref 5.0–8.0)
Protein, ur: NEGATIVE mg/dL
Specific Gravity, Urine: 1.022 (ref 1.005–1.030)

## 2016-05-12 LAB — URINE MICROSCOPIC-ADD ON: WBC, UA: NONE SEEN WBC/hpf (ref 0–5)

## 2016-05-12 NOTE — ED Notes (Signed)
[facility] here to take patient to [facility]

## 2016-05-12 NOTE — Progress Notes (Signed)
Patient ID: Yesenia [name], female   DOB: August 01, 1960, 55 y.o.   MRN: [id number]   Location:    [facility] Room Number: 116-A Place of Service:  SNF (31)   CODE STATUS: Full Code  Allergies  Allergen Reactions  . Buprenorphine Hcl Hives  . Morphine And Related Hives and Dermatitis    Chief Complaint  Patient presents with  . Acute Visit    ER follow up    HPI:  She requested to go to the ED yesterday for abdominal pain; shortness of breath; flank pain and nausea. Her blood work returned essentially normal; her chest x-ray was without active disease as well. She tells me that she was instructed to splint when she coughs. She did have RT come and see her as well and did not find any abnormality as well.    Past Medical History:  Diagnosis Date  . Anemia   . Anemia   . Chronic pain   . CKD (chronic kidney disease)   . Diabetes mellitus without complication (HCC)    diet controlled. does not check CBG's  . Fibromyalgia    hospitilized 12/16 due to inability to walk  . Hyperlipemia   . Hypertension   . Insomnia   . Neuropathy (HCC)   . Right sided weakness   . Stroke [facility](HCC) 2014?   no residual weakness   . Vitamin deficiency    Vit D    Past Surgical History:  Procedure Laterality Date  . KNEE SURGERY  1992, 1995, 1997  . LEG SURGERY Right 2007   Surgery x3 2 arthroscopies and one rod  . OOPHORECTOMY Right 1993?  . TRACHEOSTOMY TUBE PLACEMENT N/A 02/13/2016   Procedure: TRACHEOSTOMY;  Surgeon: Christia Readingwight Bates, MD;  [facility];  Service: ENT;  Laterality: N/A;    Social History   Social History  . Marital status: Single    Spouse name: N/A  . Number of children: 2  . Years of education: 12th    Occupational History  . SSI    Social History Main Topics  . Smoking status: Never Smoker  . Smokeless tobacco: Never Used  . Alcohol use No  . Drug use: No  . Sexual activity: No   Other Topics Concern  . Not on file   Social History Narrative   Reports no caffeine use    Family History  Problem Relation Age of Onset  . Breast cancer Mother   . Leukemia Brother   . Breast cancer Maternal Grandmother      There were no vitals filed for this visit.   Patient's Medications  New Prescriptions   No medications on file  Previous Medications   ALBUTEROL (PROVENTIL HFA;VENTOLIN HFA) 108 (90 BASE) MCG/ACT INHALER    Inhale 2 puffs into the lungs every 6 (six) hours as needed for wheezing.   AMLODIPINE (NORVASC) 10 MG TABLET    Take 10 mg by mouth daily.   ATENOLOL (TENORMIN) 50 MG TABLET    Take 50 mg by mouth daily.   BUSPIRONE (BUSPAR) 15 MG TABLET    Take 15 mg by mouth 2 (two) times daily.   CHOLESTYRAMINE (QUESTRAN) 4 G PACKET    Take 4 g by mouth daily.   CLONAZEPAM (KLONOPIN) 0.5 MG TABLET    Take one tablet by mouth once daily at bedtime   ESCITALOPRAM (LEXAPRO) 10 MG TABLET    Take 10 mg by mouth daily.    FERROUS SULFATE 325 (65 FE) MG  TABLET    Take 325 mg by mouth daily.   GLIPIZIDE (GLUCOTROL XL) 2.5 MG 24 HR TABLET    Take 1 tablet (2.5 mg total) by mouth daily with breakfast.   HYDROCHLOROTHIAZIDE (HYDRODIURIL) 25 MG TABLET    Take 0.5 tablets (12.5 mg total) by mouth daily.   HYOSCYAMINE (ANASPAZ) 0.125 MG TBDP DISINTERGRATING TABLET    Place 0.125 mg under the tongue 3 (three) times daily as needed. For Gas-esophageal  Reflux   LOPERAMIDE (IMODIUM) 2 MG CAPSULE    Take 2 mg by mouth 3 (three) times daily before meals.   ONDANSETRON (ZOFRAN) 4 MG TABLET    Take 4 mg by mouth daily. May also give one tablet every 8 hours prn   PREGABALIN (LYRICA) 50 MG CAPSULE    Take 1 capsule (50 mg total) by mouth daily.   SACCHAROMYCES BOULARDII (FLORASTOR) 250 MG CAPSULE    Take 250 mg by mouth 2 (two) times daily.   TRAMADOL (ULTRAM) 50 MG TABLET    Take 50 mg by mouth every 6 (six) hours as needed.   TRAZODONE (DESYREL) 150 MG TABLET    Take 150 mg by mouth at bedtime.  Modified Medications   No medications on file    Discontinued Medications   No medications on file     SIGNIFICANT DIAGNOSTIC EXAMS    07-06-15: bilateral hip and pelvis x-ray; Negative exam.  07-06-15: ct scan of  left hip: No evidence of fracture or dislocation. No significant joint effusion. No definite findings seen to suggest septic arthritis at this time  07-17-15: left elbow x-ray: 1. No evidence of acute fracture or dislocation. 2. Several small osseous fragments overlying the medial epicondyle, likely reflecting remote injury.  03-06-16: chest x-ray: Low volumes with some increase in bibasilar atelectasis or infiltrate.  03-07-16: chest x-ray: No acute cardiopulmonary findings.  04-12-16: chest x-ray: Tracheostomy. Shallow inspiration. Mild cardiac enlargement without vascular congestion. No focal airspace disease or consolidation. No blunting of costophrenic angles. No pneumothorax. Tortuous aorta.  04-13-16: pelvic x-ray: There is no evidence of pelvic fracture or diastasis. No pelvic bone lesions are seen.  04-13-16: mri brain: 1. No acute intracranial abnormality. 2. Moderate cerebral white matter disease, unchanged and nonspecific though may reflect age advanced chronic small vessel ischemia.  04-13-16: mri of lumbar spine: 1. At L4-5 there is a mild broad-based disc bulge. Moderate left and mild right facet arthropathy. 2. At L5-S1 there is a broad-based disc bulge. Moderate bilateral facet arthropathy.  05-11-16: chest x-ray: Minimal bibasilar atelectasis noted. Lungs otherwise grossly clear. Mild cardiomegaly.    LABS REVIEWED:   07-07-15: wbc 9.2; hgb 9.8; hct 30.7; mcv 89.5; plt 260 glucose 123; bun 17; creat 1.29; k+ 3.3; na++142; vitamin B12: 552; folate 9.2; iron 71; TIBC 253 07-09-15: wbc 7.7; hgb 9.7; hct 30.6; mcv 88.7; ;plt 290; glucose 118; bun 14; creat 0.99; k+ 3.6; na++142 07-17-15: wbc 10.0; hgb 11.3; hct 33.9; mcv 88.1; plt 318; glucose 112; bun 16; creat 1.13; k+ 3.7; na++142; sed rate 60; CRP 3.1   02-05-16: hgb a1c 6.1 03-06-16: wbc 7.1;hb 11.8; hct 36.8;mcv 88.0; plt 396; glucose 110; bun 16; creat 1.26; k+ 3.1; na++ 140; urine culture: 40,000 enterococcus 03-11-16: wbc 7.4; hgb 9.4; hct 30.2; mcv 88.3; plt 294; glucose 106; bun 13; creat 1.11; k+ 3.9; na++ 141; liver normal albumin 2.9  04-12-16: wbc 7.9; hgb 10.8; hct 35.5; mcv 90.8; plt 319; glucose 105; bun 11; creat 1.18; k+ 3.7; na++ 143;  liver normal albumin 3.2; BNP 69; blood culture: no growth 04-13-16: tsh 0.720; urine culture: multiple species 04-14-16: wbc 7.2; hgb 10.4; hct 33.8; mcv 90.6; plt 283; glucose 86; bun 12; creat 0.96; k+ 3.8; na++ 142  05-12-16: wbc 8.3; hgb 11.4; hct 35.9; mcv 88.2; plt 309; glucose 90; bun 17; creat 1.23; k+ 3.6; na++ 143; liver normal albumin 3.5; urine culture: no growth    Review of Systems  Constitutional: Negative for malaise/fatigue.  Respiratory: Positive for cough. Negative for shortness of breath.        Has trach  Has occasional cough   Cardiovascular: Negative for chest pain, palpitations and leg swelling.  Gastrointestinal: Negative for abdominal pain, constipation, heartburn and nausea.  Musculoskeletal: Negative for back pain, joint pain and myalgias.  Skin: Negative.   Neurological: Negative for dizziness.  Psychiatric/Behavioral: The patient is not nervous/anxious.       Physical Exam  Constitutional: She is oriented to person, place, and time. She appears well-developed and well-nourished. No distress.  Morbidly obese   Eyes: Conjunctivae are normal.  Neck: Neck supple. No JVD present. No thyromegaly present.  Has trach  Cardiovascular: Normal rate, regular rhythm and intact distal pulses.   Respiratory: Effort normal and breath sounds normal. No respiratory distress. She has no wheezes.  GI: Soft. Bowel sounds are normal. She exhibits no distension. There is no tenderness.  Musculoskeletal: She exhibits no edema.  Able to move all extremities    Lymphadenopathy:     She has no cervical adenopathy.  Neurological: She is alert and oriented to person, place, and time.  Skin: Skin is warm and dry. She is not diaphoretic.  Psychiatric: She has a normal mood and affect.     ASSESSMENT/PLAN   1. Abdominal pain: at this time will not make changes to her regimen; I have encouraged to splint with a pillow when she coughs; she states that she will do so.   MD is aware of resident's narcotic use and is in agreement with current plan of care. We will attempt to wean resident as apropriate   Synthia Innocent NP [facility]  Contact [phone] Monday through Friday 8am- 5pm  After hours call [phone]

## 2016-05-12 NOTE — ED Notes (Signed)
Unable to obtain blood from IV 

## 2016-05-12 NOTE — Progress Notes (Signed)
Trach disposable inner cannulas are on the bedside table in room D30

## 2016-05-12 NOTE — ED Notes (Signed)
Inner cannula changed.

## 2016-05-12 NOTE — ED Notes (Signed)
Patient assisted up to wheelchair and taken to the restroom.  Patient had BM but was unable to obtain urine.  Back to room 30 and assisted back to bed.  Inner cannula cleaned of green mucous

## 2016-05-12 NOTE — ED Notes (Signed)
Called [facility]- T[place]  

## 2016-05-13 LAB — URINE CULTURE: CULTURE: NO GROWTH

## 2016-05-14 ENCOUNTER — Non-Acute Institutional Stay (SKILLED_NURSING_FACILITY): Payer: Medicaid Other | Admitting: Adult Health

## 2016-05-14 ENCOUNTER — Encounter: Payer: Self-pay | Admitting: Adult Health

## 2016-05-14 ENCOUNTER — Encounter: Payer: Self-pay | Admitting: Physician Assistant

## 2016-05-14 DIAGNOSIS — L03113 Cellulitis of right upper limb: Secondary | ICD-10-CM

## 2016-05-14 NOTE — Progress Notes (Signed)
Patient ID: Yesenia [name], female   DOB: 1960/09/23, 55 y.o.   MRN: [id number]    Location:   [facility] Room Number: 116-A Place of Service:  SNF (31)   CODE STATUS: Full Code  Allergies  Allergen Reactions  . Buprenorphine Hcl Hives  . Morphine And Related Hives and Dermatitis    Chief Complaint  Patient presents with  . Acute Visit    Arm Swelling    HPI:  She has right arm swelling present. She says that her arm is painful from the swelling. She denies any fever present. She is not on anticoagulation therapy. She tells me that her arm has been swollen for the past several days.    Past Medical History:  Diagnosis Date  . Anemia   . Anemia   . Chronic pain   . CKD (chronic kidney disease)   . Diabetes mellitus without complication (HCC)    diet controlled. does not check CBG's  . Fibromyalgia    hospitilized 12/16 due to inability to walk  . Hyperlipemia   . Hypertension   . Insomnia   . Neuropathy (HCC)   . Right sided weakness   . Stroke [facility](HCC) 2014?   no residual weakness   . Vitamin deficiency    Vit D    Past Surgical History:  Procedure Laterality Date  . KNEE SURGERY  1992, 1995, 1997  . LEG SURGERY Right 2007   Surgery x3 2 arthroscopies and one rod  . OOPHORECTOMY Right 1993?  . TRACHEOSTOMY TUBE PLACEMENT N/A 02/13/2016   Procedure: TRACHEOSTOMY;  Surgeon: Christia Readingwight Bates, MD;  [facility];  Service: ENT;  Laterality: N/A;    Social History   Social History  . Marital status: Single    Spouse name: N/A  . Number of children: 2  . Years of education: 12th    Occupational History  . [employer]    Social History Main Topics  . Smoking status: Never Smoker  . Smokeless tobacco: Never Used  . Alcohol use No  . Drug use: No  . Sexual activity: No   Other Topics Concern  . Not on file   Social History Narrative   Reports no caffeine use    Family History  Problem Relation Age of Onset  . Breast cancer Mother   . Leukemia  Brother   . Breast cancer Maternal Grandmother       VITAL SIGNS BP 132/70   Pulse 84   Temp 97.6 F (36.4 C) (Oral)   Resp (!) 26   Ht 5\' 7"  (1.702 m)   Wt (!) 330 lb (149.7 kg)   SpO2 97%   BMI 51.69 kg/m   Patient's Medications  New Prescriptions   No medications on file  Previous Medications   ALBUTEROL (PROVENTIL HFA;VENTOLIN HFA) 108 (90 BASE) MCG/ACT INHALER    Inhale 2 puffs into the lungs every 6 (six) hours as needed for wheezing.   AMLODIPINE (NORVASC) 10 MG TABLET    Take 10 mg by mouth daily.   ATENOLOL (TENORMIN) 50 MG TABLET    Take 50 mg by mouth daily.   BUSPIRONE (BUSPAR) 15 MG TABLET    Take 15 mg by mouth 2 (two) times daily.   CHOLESTYRAMINE (QUESTRAN) 4 G PACKET    Take 4 g by mouth daily.   CLONAZEPAM (KLONOPIN) 0.5 MG TABLET    Take one tablet by mouth once daily at bedtime   ESCITALOPRAM (LEXAPRO) 10 MG TABLET  Take 10 mg by mouth daily.    FERROUS SULFATE 325 (65 FE) MG TABLET    Take 325 mg by mouth daily.   GLIPIZIDE (GLUCOTROL XL) 2.5 MG 24 HR TABLET    Take 1 tablet (2.5 mg total) by mouth daily with breakfast.   HYDROCHLOROTHIAZIDE (HYDRODIURIL) 25 MG TABLET    Take 0.5 tablets (12.5 mg total) by mouth daily.   HYOSCYAMINE (ANASPAZ) 0.125 MG TBDP DISINTERGRATING TABLET    Place 0.125 mg under the tongue 3 (three) times daily as needed. For Gas-esophageal  Reflux   LOPERAMIDE (IMODIUM) 2 MG CAPSULE    Take 2 mg by mouth 3 (three) times daily before meals.   ONDANSETRON (ZOFRAN) 4 MG TABLET    Take 4 mg by mouth daily. May also give one tablet every 8 hours prn   PREGABALIN (LYRICA) 50 MG CAPSULE    Take 1 capsule (50 mg total) by mouth daily.   SACCHAROMYCES BOULARDII (FLORASTOR) 250 MG CAPSULE    Take 250 mg by mouth 2 (two) times daily.   TRAMADOL (ULTRAM) 50 MG TABLET    Take 50 mg by mouth every 6 (six) hours as needed.   TRAZODONE (DESYREL) 150 MG TABLET    Take 150 mg by mouth at bedtime.  Modified Medications   No medications on file    Discontinued Medications   No medications on file     SIGNIFICANT DIAGNOSTIC EXAMS   07-06-15: bilateral hip and pelvis x-ray; Negative exam.  07-06-15: ct scan of  left hip: No evidence of fracture or dislocation. No significant joint effusion. No definite findings seen to suggest septic arthritis at this time  07-17-15: left elbow x-ray: 1. No evidence of acute fracture or dislocation. 2. Several small osseous fragments overlying the medial epicondyle, likely reflecting remote injury.  03-06-16: chest x-ray: Low volumes with some increase in bibasilar atelectasis or infiltrate.  03-07-16: chest x-ray: No acute cardiopulmonary findings.  04-12-16: chest x-ray: Tracheostomy. Shallow inspiration. Mild cardiac enlargement without vascular congestion. No focal airspace disease or consolidation. No blunting of costophrenic angles. No pneumothorax. Tortuous aorta.  04-13-16: pelvic x-ray: There is no evidence of pelvic fracture or diastasis. No pelvic bone lesions are seen.  04-13-16: mri brain: 1. No acute intracranial abnormality. 2. Moderate cerebral white matter disease, unchanged and nonspecific though may reflect age advanced chronic small vessel ischemia.  04-13-16: mri of lumbar spine: 1. At L4-5 there is a mild broad-based disc bulge. Moderate left and mild right facet arthropathy. 2. At L5-S1 there is a broad-based disc bulge. Moderate bilateral facet arthropathy.  05-11-16: chest x-ray: Minimal bibasilar atelectasis noted. Lungs otherwise grossly clear. Mild cardiomegaly.    LABS REVIEWED:   07-07-15: wbc 9.2; hgb 9.8; hct 30.7; mcv 89.5; plt 260 glucose 123; bun 17; creat 1.29; k+ 3.3; na++142; vitamin B12: 552; folate 9.2; iron 71; TIBC 253 07-09-15: wbc 7.7; hgb 9.7; hct 30.6; mcv 88.7; ;plt 290; glucose 118; bun 14; creat 0.99; k+ 3.6; na++142 07-17-15: wbc 10.0; hgb 11.3; hct 33.9; mcv 88.1; plt 318; glucose 112; bun 16; creat 1.13; k+ 3.7; na++142; sed rate 60; CRP 3.1   02-05-16: hgb a1c 6.1 03-06-16: wbc 7.1;hb 11.8; hct 36.8;mcv 88.0; plt 396; glucose 110; bun 16; creat 1.26; k+ 3.1; na++ 140; urine culture: 40,000 enterococcus 03-11-16: wbc 7.4; hgb 9.4; hct 30.2; mcv 88.3; plt 294; glucose 106; bun 13; creat 1.11; k+ 3.9; na++ 141; liver normal albumin 2.9  04-12-16: wbc 7.9; hgb 10.8; hct 35.5;  mcv 90.8; plt 319; glucose 105; bun 11; creat 1.18; k+ 3.7; na++ 143; liver normal albumin 3.2; BNP 69; blood culture: no growth 04-13-16: tsh 0.720; urine culture: multiple species 04-14-16: wbc 7.2; hgb 10.4; hct 33.8; mcv 90.6; plt 283; glucose 86; bun 12; creat 0.96; k+ 3.8; na++ 142  05-12-16: wbc 8.3; hgb 11.4; hct 35.9; mcv 88.2; plt 309; glucose 90; bun 17; creat 1.23; k+ 3.6; na++ 143; liver normal albumin 3.5; urine culture: no growth    Review of Systems  Constitutional: Negative for malaise/fatigue.  Respiratory: Positive for cough. Negative for shortness of breath.        Has trach  Has occasional cough   Cardiovascular: Negative for chest pain, palpitations and leg swelling.  Has right arm swelling  Gastrointestinal: Negative for abdominal pain, constipation, heartburn and nausea.  Musculoskeletal: Negative for back pain, joint pain and myalgias.  Skin: Negative.   Neurological: Negative for dizziness.  Psychiatric/Behavioral: The patient is not nervous/anxious.       Physical Exam  Constitutional: She is oriented to person, place, and time. She appears well-developed and well-nourished. No distress.  Morbidly obese   Eyes: Conjunctivae are normal.  Neck: Neck supple. No JVD present. No thyromegaly present.  Has trach  Cardiovascular: Normal rate, regular rhythm and intact distal pulses.   Respiratory: Effort normal and breath sounds normal. No respiratory distress. She has no wheezes.  GI: Soft. Bowel sounds are normal. She exhibits no distension. There is no tenderness.  Musculoskeletal: right upper extremity with redness and 2+ edema  present  Able to move all extremities    Lymphadenopathy:    She has no cervical adenopathy.  Neurological: She is alert and oriented to person, place, and time.  Skin: Skin is warm and dry. She is not diaphoretic.  Psychiatric: She has a normal mood and affect.     ASSESSMENT/PLAN  1. Right upper extremity cellulitis: will get a doppler to rule out dvt; will begin doxycycline 100 mg twice daily for 3 weeks; will monitor       MD is aware of resident's narcotic use and is in agreement with current plan of care. We will attempt to wean resident as apropriate   Synthia Innocenteborah Shemika Robbs NP [facility]  Contact [phone] Monday through Friday 8am- 5pm  After hours call [phone]

## 2016-05-16 ENCOUNTER — Emergency Department (HOSPITAL_COMMUNITY)
Admission: EM | Admit: 2016-05-16 | Discharge: 2016-05-16 | Disposition: A | Discharge status: Home / Self Care | Payer: Medicaid Other | Attending: Emergency Medicine | Admitting: Emergency Medicine

## 2016-05-16 ENCOUNTER — Encounter (HOSPITAL_COMMUNITY): Payer: Self-pay | Admitting: Emergency Medicine

## 2016-05-16 DIAGNOSIS — E1122 Type 2 diabetes mellitus with diabetic chronic kidney disease: Secondary | ICD-10-CM | POA: Insufficient documentation

## 2016-05-16 DIAGNOSIS — E1140 Type 2 diabetes mellitus with diabetic neuropathy, unspecified: Secondary | ICD-10-CM | POA: Diagnosis not present

## 2016-05-16 DIAGNOSIS — I129 Hypertensive chronic kidney disease with stage 1 through stage 4 chronic kidney disease, or unspecified chronic kidney disease: Secondary | ICD-10-CM | POA: Diagnosis not present

## 2016-05-16 DIAGNOSIS — Z8673 Personal history of transient ischemic attack (TIA), and cerebral infarction without residual deficits: Secondary | ICD-10-CM | POA: Diagnosis not present

## 2016-05-16 DIAGNOSIS — J9500 Unspecified tracheostomy complication: Secondary | ICD-10-CM

## 2016-05-16 DIAGNOSIS — Z79899 Other long term (current) drug therapy: Secondary | ICD-10-CM | POA: Insufficient documentation

## 2016-05-16 DIAGNOSIS — Z7984 Long term (current) use of oral hypoglycemic drugs: Secondary | ICD-10-CM | POA: Insufficient documentation

## 2016-05-16 DIAGNOSIS — N183 Chronic kidney disease, stage 3 (moderate): Secondary | ICD-10-CM | POA: Insufficient documentation

## 2016-05-16 DIAGNOSIS — J9509 Other tracheostomy complication: Secondary | ICD-10-CM | POA: Diagnosis not present

## 2016-05-16 MED ORDER — LIDOCAINE VISCOUS 2 % MT SOLN
15.0000 mL | Freq: Once | OROMUCOSAL | Status: DC
  Filled 2016-05-16: qty 15

## 2016-05-16 NOTE — ED Notes (Signed)
ENT at bedside

## 2016-05-16 NOTE — ED Provider Notes (Signed)
[facility]-EMERGENCY DEPT Provider Note   CSN: [id number] Arrival date & time: 05/16/16  0915     History   Chief Complaint Chief Complaint  Patient presents with  . Tracheostomy Tube Change    HPI Lithzy Bernard is a 55 y.o. female. With a history of severe sleep apnea requiring tracheostomy placement on 02/13/2016 due to intolerance of 3 CPAP machine's presents to the emergency department stating that she awoke this morning and noted that her tracheostomy tube had fallen out. She states that she utilizes a trach collar with humidified oxygen at night but does not utilize any oxygen during the day while she is awake. She currently denies any respiratory distress, cough, shortness of breath. She states that care providers at her nursing facility tried to replace her trach this morning to no avail and she notes some soreness around this area. She presents to the emergency department for tracheostomy tube replacement.  HPI  Past Medical History:  Diagnosis Date  . Anemia   . Anemia   . Chronic pain   . CKD (chronic kidney disease)   . Diabetes mellitus without complication (HCC)    diet controlled. does not check CBG's  . Fibromyalgia    hospitilized 12/16 due to inability to walk  . Hyperlipemia   . Hypertension   . Insomnia   . Neuropathy (HCC)   . Right sided weakness   . Stroke [facility]) 2014?   no residual weakness   . Vitamin deficiency    Vit D    Patient Active Problem List   Diagnosis Date Noted  . Tracheostomy dependent (HCC)   . Generalized weakness 04/13/2016  . Weakness generalized 04/13/2016  . HCAP (healthcare-associated pneumonia) 03/06/2016  . Fibromyalgia 02/22/2016  . Cellulitis 02/21/2016  . Abdominal pain 02/16/2016  . Constipation 02/16/2016  . CKD (chronic kidney disease) stage 3, GFR 30-59 ml/min 02/16/2016  . Fever   . Cough   . Sleep apnea 02/13/2016  . Insomnia 08/10/2015  . Polyneuropathy (HCC) 07/10/2015  . GERD (gastroesophageal reflux disease)  07/10/2015  . Acute kidney injury superimposed on chronic kidney disease (HCC) 07/07/2015  . Gait disturbance 07/07/2015  . Morbid obesity (HCC) 07/07/2015  . Left hip pain 07/06/2015  . Chronic pain   . Right sided weakness   . Headache(784.0) 04/14/2013  . Encephalopathy acute 04/11/2013  . Acute pyelonephritis 04/11/2013  . Diabetes mellitus, type 2 (HCC) 04/11/2013  . Dyslipidemia 04/11/2013  . Chest pain 02/15/2013  . Cerebral infarction (HCC) 01/14/2013  . HTN (hypertension) 01/14/2013  . Anemia 01/14/2013    Past Surgical History:  Procedure Laterality Date  . KNEE SURGERY  1992, 1995, 1997  . LEG SURGERY Right 2007   Surgery x3 2 arthroscopies and one rod  . OOPHORECTOMY Right 1993?  . TRACHEOSTOMY TUBE PLACEMENT N/A 02/13/2016   Procedure: TRACHEOSTOMY;  Surgeon: Christia Reading, MD;  Location: [facility] OR;  Service: ENT;  Laterality: N/A;    OB History    No data available       Home Medications    Prior to Admission medications   Medication Sig Start Date End Date Taking? Authorizing Provider  albuterol (PROVENTIL HFA;VENTOLIN HFA) 108 (90 BASE) MCG/ACT inhaler Inhale 2 puffs into the lungs every 6 (six) hours as needed for wheezing.    Historical Provider, MD  amLODipine (NORVASC) 10 MG tablet Take 10 mg by mouth daily. 02/23/16   Historical Provider, MD  atenolol (TENORMIN) 50 MG tablet Take 50 mg by mouth daily. 02/16/16  Historical Provider, MD  busPIRone (BUSPAR) 15 MG tablet Take 15 mg by mouth 2 (two) times daily.    Historical Provider, MD  cholestyramine (QUESTRAN) 4 g packet Take 4 g by mouth daily.    Historical Provider, MD  clonazePAM (KLONOPIN) 0.5 MG tablet Take one tablet by mouth once daily at bedtime Patient taking differently: Take 0.5 mg by mouth at bedtime. Take one tablet by mouth once daily at bedtime 04/16/16   Kirt BoysMonica Carter, DO  escitalopram (LEXAPRO) 10 MG tablet Take 10 mg by mouth daily.     Historical Provider, MD  ferrous sulfate 325 (65 FE) MG  tablet Take 325 mg by mouth daily. 09/13/14   Historical Provider, MD  glipiZIDE (GLUCOTROL XL) 2.5 MG 24 hr tablet Take 1 tablet (2.5 mg total) by mouth daily with breakfast. 03/11/16   Rhetta MuraJai-Gurmukh Samtani, MD  hydrochlorothiazide (HYDRODIURIL) 25 MG tablet Take 0.5 tablets (12.5 mg total) by mouth daily. 03/11/16   Rhetta MuraJai-Gurmukh Samtani, MD  hyoscyamine (ANASPAZ) 0.125 MG TBDP disintergrating tablet Place 0.125 mg under the tongue 3 (three) times daily as needed. For Gas-esophageal  Reflux    Historical Provider, MD  loperamide (IMODIUM) 2 MG capsule Take 2 mg by mouth 3 (three) times daily before meals.    Historical Provider, MD  ondansetron (ZOFRAN) 4 MG tablet Take 4 mg by mouth daily. May also give one tablet every 8 hours prn    Historical Provider, MD  pregabalin (LYRICA) 50 MG capsule Take 1 capsule (50 mg total) by mouth daily. 04/15/16   Hollice EspySendil K Krishnan, MD  saccharomyces boulardii (FLORASTOR) 250 MG capsule Take 250 mg by mouth 2 (two) times daily.    Historical Provider, MD  traMADol (ULTRAM) 50 MG tablet Take 50 mg by mouth every 6 (six) hours as needed.    Historical Provider, MD  traZODone (DESYREL) 150 MG tablet Take 150 mg by mouth at bedtime.    Historical Provider, MD    Family History Family History  Problem Relation Age of Onset  . Breast cancer Mother   . Leukemia Brother   . Breast cancer Maternal Grandmother     Social History Social History  Substance Use Topics  . Smoking status: Never Smoker  . Smokeless tobacco: Never Used  . Alcohol use No     Allergies   Buprenorphine hcl and Morphine and related   Review of Systems Review of Systems  Constitutional: Negative for activity change, chills and fever.  HENT: Negative for congestion, sinus pain, sinus pressure and sore throat.   Respiratory: Negative for cough, choking, chest tightness, shortness of breath, wheezing and stridor.   Cardiovascular: Negative for chest pain.  Gastrointestinal: Negative for  abdominal pain, nausea and vomiting.  Musculoskeletal: Positive for neck pain (around tracheostomy site). Negative for neck stiffness.  Skin: Positive for wound (tracheostomy wound).  Neurological: Negative for speech difficulty, weakness, numbness and headaches.  All other systems reviewed and are negative.    Physical Exam Updated Vital Signs BP 122/92 (BP Location: Left Arm)   Pulse 72   Temp 97.9 F (36.6 C) (Oral)   Resp 20   SpO2 100%   Physical Exam  Constitutional: She is oriented to person, place, and time. She appears well-developed and well-nourished. No distress.  HENT:  Head: Normocephalic and atraumatic.  Nose: Nose normal.  Mouth/Throat: Oropharynx is clear and moist.  Eyes: Conjunctivae and EOM are normal. Pupils are equal, round, and reactive to light.  Neck: Normal range of motion.  Neck supple.  Tracheostomy tube removed. Tracheostomy incision with small amount of bleeding surrounding from multiple attempts to replace. There is tight scar tissue just below her incision which is likely contributing to the difficulty in passing the tube. No associated redness or calor or drainage suggestive of infection.   Cardiovascular: Normal rate, regular rhythm and normal heart sounds.   Pulmonary/Chest: Effort normal and breath sounds normal. No respiratory distress.  Abdominal: Soft. She exhibits no distension. There is no tenderness.  Neurological: She is alert and oriented to person, place, and time. No cranial nerve deficit. Coordination normal.  Skin: Skin is warm and dry. No rash noted. She is not diaphoretic. No erythema.  Nursing note and vitals reviewed.    ED Treatments / Results  Labs (all labs ordered are listed, but only abnormal results are displayed) Labs Reviewed - No data to display  EKG  EKG Interpretation None       Radiology No results found.  Procedures Procedures (including critical care time)  Medications Ordered in ED Medications    lidocaine (XYLOCAINE) 2 % viscous mouth solution 15 mL (not administered)     Initial Impression / Assessment and Plan / ED Course  I have reviewed the triage vital signs and the nursing notes.  Pertinent labs & imaging results that were available during my care of the patient were reviewed by me and considered in my medical decision making (see chart for details).  Clinical Course    55 year old female with tracheostomy tube in place for severe sleep apnea presented to the emergency department after her tracheostomy tube was inadvertently removed overnight.   Exam as above with significant scar tissue just below her tracheostomy tube site, likely contributing to the difficulty in passing the tube.  The patient had a size 6 tube in place initially which was unable to be passed. Oxygen tubing from nasal cannula was inserted into the trachea and used to try to help introduce a size 4 tracheostomy tube to no avail.   ENT was consulted and agreed to see the patient at the bedside.   [name] was replaced after dilation. Rec'd follow up with Dr. Jenne PaneBates in clinic within a week for further evaluation and possible up-sizing at that time. This plan was discussed with the patient at the bedside and she stated both understanding and agreement with this plan.     Final Clinical Impressions(s) / ED Diagnoses   Final diagnoses:  Complication of tracheostomy tube [facility](HCC)    New Prescriptions New Prescriptions   No medications on file     Francoise CeoWarren S Shirly Bartosiewicz, DO 05/16/16 1142    Doug SouSam Jacubowitz, MD 05/16/16 [phone]

## 2016-05-16 NOTE — Consult Note (Signed)
  Reason for Consult:SDislodged trach Referring Physician: Doug SouSam Jacubowitz, MD  Yesenia [name] is an 55 y.o. female.  HPI: Yesenia [name] in August for severe sleep apnea, by Dr. Jenne [name]. She woke up this morning with the tube out. She is not having any trouble breathing.  Past Medical History:  Diagnosis Date  . Anemia   . Anemia   . Chronic pain   . CKD (chronic kidney disease)   . Diabetes mellitus without complication (HCC)    diet controlled. does not check CBG's  . Fibromyalgia    hospitilized 12/16 due to inability to walk  . Hyperlipemia   . Hypertension   . Insomnia   . Neuropathy (HCC)   . Right sided weakness   . Stroke [facility](HCC) 2014?   no residual weakness   . Vitamin deficiency    Vit D    Past Surgical History:  Procedure Laterality Date  . KNEE SURGERY  1992, 1995, 1997  . LEG SURGERY Right 2007   Surgery x3 2 arthroscopies and one rod  . OOPHORECTOMY Right 1993?  . TRACHEOSTOMY TUBE PLACEMENT N/A 02/13/2016   Procedure: TRACHEOSTOMY;  Surgeon: Yesenia Readingwight Bates, MD;  [facility];  Service: ENT;  Laterality: N/A;    Family History  Problem Relation Age of Onset  . Breast cancer Mother   . Leukemia Brother   . Breast cancer Maternal Grandmother     Social History:  reports that she has never smoked. She has never used smokeless tobacco. She reports that she does not drink alcohol or use drugs.  Allergies:  Allergies  Allergen Reactions  . Buprenorphine Hcl Hives  . Morphine And Related Hives and Dermatitis    Medications: Reviewed  No results found for this or any previous visit (from the past 48 hour(s)).  No results found.  ZOX:[id number]:Negative except as listed in admit H&P  Blood pressure 117/69, pulse 71, temperature 97.9 F (36.6 C), temperature source Oral, resp. rate 20, SpO2 100 %.  PHYSICAL EXAM: Overall appearance:  Healthy appearing obese lady, in no distress Head:  Normocephalic, atraumatic. Ears: External ears are healthy. Nose: External nose is  healthy in appearance. Internal nasal exam free of any lesions or obstruction. Oral Cavity/Pharynx:  There are no mucosal lesions or masses identified. Larynx/Hypopharynx: Deferred Neuro:  No identifiable neurologic deficits. Neck: No palpable neck masses. Yesenia [name] is contracted.  Studies Reviewed: none  Procedures: Dilation of tracheostomy [name]. Urethral sounds were used to serially dilate up to 36 Fr. A 4 uncuffed trach was placed. She tolerated this well.   Assessment/Plan: Trach replaced with a #4. She should follow up in office with Dr. Jenne [name] in a week or so to see about up[sizing.  Yesenia [name] 05/16/2016, 10:53 AM

## 2016-05-16 NOTE — ED Triage Notes (Signed)
Per EMS: pt from [facility] c/o trach came out during the night and the facility was unable to get back in; no distress noted; pt on [place]

## 2016-05-16 NOTE — ED Provider Notes (Signed)
Patient's tracheostomy came out. She denies shortness of breath at present. Tracheostomy was placed October 2017 for sleep apnea. She's been unable to tolerate CPAP masks due to claustrophobia   Doug SouSam Caid Radin, MD 05/16/16 [phone]

## 2016-05-17 ENCOUNTER — Non-Acute Institutional Stay (SKILLED_NURSING_FACILITY): Payer: Medicaid Other | Admitting: Internal Medicine

## 2016-05-17 ENCOUNTER — Encounter: Payer: Self-pay | Admitting: Internal Medicine

## 2016-05-17 DIAGNOSIS — I10 Essential (primary) hypertension: Secondary | ICD-10-CM

## 2016-05-17 DIAGNOSIS — M797 Fibromyalgia: Secondary | ICD-10-CM | POA: Diagnosis not present

## 2016-05-17 DIAGNOSIS — G4700 Insomnia, unspecified: Secondary | ICD-10-CM | POA: Diagnosis not present

## 2016-05-17 DIAGNOSIS — E1142 Type 2 diabetes mellitus with diabetic polyneuropathy: Secondary | ICD-10-CM | POA: Diagnosis not present

## 2016-05-17 DIAGNOSIS — G629 Polyneuropathy, unspecified: Secondary | ICD-10-CM

## 2016-05-17 DIAGNOSIS — R609 Edema, unspecified: Secondary | ICD-10-CM

## 2016-05-17 DIAGNOSIS — F418 Other specified anxiety disorders: Secondary | ICD-10-CM | POA: Diagnosis not present

## 2016-05-17 DIAGNOSIS — Z930 Tracheostomy status: Secondary | ICD-10-CM

## 2016-05-17 NOTE — Progress Notes (Signed)
Patient ID: Leydy Worthey, female   DOB: [birth year], 55 y.o.   MRN: [id number]    DATE: 05/17/16  Location:    [facility] Room Number: 116 A Place of Service: SNF (31)   Extended Emergency Contact Information Primary Emergency Contact: Howell,Catrina  [place] Phone: [phone] Relation: Daughter  Advanced Directive information  FULL CODE  Chief Complaint  Patient presents with  . Medical Management of Chronic Issues    Routine Visit    HPI:  55 yo female seen today for routine visit. She c/o insomnia and has difficulty falling asleep. She also c/o increased swelling in RUE and b/l LE. She takes lyrica for FMS. She states lyrica does not help. She has taken gabapentin in the past. She is a poor historian due to memory loss. Hx obtained from chart  Hypertension - stable on HCTZ 12.5 mg daily; tenormin 50 mg daily; amlodipine 10 mg daily  GERD - spasms stable on hyoscyamine 0.125 mg SL three times daily as needed; zofran prn  Hx CVA - stable. She is not on ASA daily  Fibromyalgia/chronic pain syndrome - pain uncontrolled on lyrica 50 mg daily; she has depression/anxiety and also takes lexapro, trazodone, klonopin and buspar  CKD stage III - stable. Cr 1.23  OSA - s/p  trach; will monitor  DM - stable. A1c 6.1%. Takes glucotrol XL 2.5 mg daily   Hx Anemia of chronic disease - Hgb 11.4. Takes iron daily  Depression with anxiety - mood stable on lexapro 10 mg daily; trazodone 150 mg nightly; klonopin  0.5 mg nightly for anxiety   IBS Diarrhea type - stable on imodium 2 mg ac meals    Past Medical History:  Diagnosis Date  . Anemia   . Anemia   . Chronic pain   . CKD (chronic kidney disease)   . Diabetes mellitus without complication (HCC)    diet controlled. does not check CBG's  . Fibromyalgia    hospitilized 12/16 due to inability to walk  . Hyperlipemia   . Hypertension   . Insomnia   . Neuropathy ([place])   . Right sided  weakness   . Stroke [facility]) 2014?   no residual weakness   . Vitamin deficiency    Vit D    Past Surgical History:  Procedure Laterality Date  . [place]  . LEG SURGERY Right 2007   Surgery x3 2 arthroscopies and one rod  . OOPHORECTOMY Right 1993?  . TRACHEOSTOMY TUBE PLACEMENT N/A 02/13/2016   Procedure: TRACHEOSTOMY;  Surgeon: Melida Quitter, MD;  Location: [facility];  Service: ENT;  Laterality: N/A;    Patient Care Team: Sofie Rower, PA-C as PCP - General (Physician Assistant) Sofie Rower, PA-C as Physician Assistant (Physician Assistant)  Social History   Social History  . Marital status: Single    Spouse name: N/A  . Number of children: 2  . Years of education: 12th    Occupational History  . [employer]    Social History Main Topics  . Smoking status: Never Smoker  . Smokeless tobacco: Never Used  . Alcohol use No  . Drug use: No  . Sexual activity: No   Other Topics Concern  . Not on file   Social History Narrative   Reports no caffeine use      reports that she has never smoked. She has never used smokeless tobacco. She reports that she does not drink alcohol or use drugs.  Family History  Problem Relation Age of Onset  . Breast cancer Mother   . Leukemia Brother   . Breast cancer Maternal Grandmother    Family Status  Relation Status  . Mother Deceased  . Brother   . Maternal Grandmother     Immunization History  Administered Date(s) Administered  . Influenza,inj,Quad PF,36+ Mos 04/12/2013  . PPD Test 02/28/2016  . Pneumococcal Polysaccharide-23 04/12/2013    Allergies  Allergen Reactions  . Buprenorphine Hcl Hives  . Morphine And Related Hives and Dermatitis    Medications: Patient's Medications  New Prescriptions   No medications on file  Previous Medications   ALBUTEROL (PROVENTIL HFA;VENTOLIN HFA) 108 (90 BASE) MCG/ACT INHALER    Inhale 2 puffs into the lungs every 6 (six) hours as needed for wheezing.    AMLODIPINE (NORVASC) 10 MG TABLET    Take 10 mg by mouth daily.   ATENOLOL (TENORMIN) 50 MG TABLET    Take 50 mg by mouth daily.   BUSPIRONE (BUSPAR) 15 MG TABLET    Take 15 mg by mouth 2 (two) times daily.   CHOLESTYRAMINE (QUESTRAN) 4 G PACKET    Take 4 g by mouth daily.   CLONAZEPAM (KLONOPIN) 0.5 MG TABLET    Take one tablet by mouth once daily at bedtime   DOXYCYCLINE (VIBRAMYCIN) 100 MG CAPSULE    Take 100 mg by mouth 2 (two) times daily.   ESCITALOPRAM (LEXAPRO) 10 MG TABLET    Take 10 mg by mouth daily.    FERROUS SULFATE 325 (65 FE) MG TABLET    Take 325 mg by mouth daily.   GLIPIZIDE (GLUCOTROL XL) 2.5 MG 24 HR TABLET    Take 1 tablet (2.5 mg total) by mouth daily with breakfast.   HYDROCHLOROTHIAZIDE (HYDRODIURIL) 25 MG TABLET    Take 0.5 tablets (12.5 mg total) by mouth daily.   HYOSCYAMINE (ANASPAZ) 0.125 MG TBDP DISINTERGRATING TABLET    Place 0.125 mg under the tongue 3 (three) times daily as needed. For Gas-esophageal  Reflux   LOPERAMIDE (IMODIUM) 2 MG CAPSULE    Take 2 mg by mouth 3 (three) times daily before meals.   ONDANSETRON (ZOFRAN) 4 MG TABLET    Take 4 mg by mouth daily. May also give one tablet every 8 hours prn   PREGABALIN (LYRICA) 50 MG CAPSULE    Take 1 capsule (50 mg total) by mouth daily.   SACCHAROMYCES BOULARDII (FLORASTOR) 250 MG CAPSULE    Take 250 mg by mouth 2 (two) times daily.   TRAMADOL (ULTRAM) 50 MG TABLET    Take 50 mg by mouth every 6 (six) hours as needed.   TRAZODONE (DESYREL) 150 MG TABLET    Take 150 mg by mouth at bedtime.  Modified Medications   No medications on file  Discontinued Medications   No medications on file    Review of Systems  Cardiovascular: Positive for leg swelling.  Musculoskeletal: Positive for arthralgias.  Neurological: Positive for numbness.  Psychiatric/Behavioral: Positive for sleep disturbance. The patient is nervous/anxious.   All other systems reviewed and are negative.   There were no vitals filed for this  visit. There is no height or weight on file to calculate BMI.  Physical Exam  Constitutional: She is oriented to person, place, and time. She appears well-developed and well-nourished.  Lying in bed resting in NAD, trach O2 intact  HENT:  Mouth/Throat: Oropharynx is clear and moist. No oropharyngeal exudate.  Eyes: Pupils are equal, round, and  reactive to light. No scleral icterus.  Neck: Neck supple. Carotid bruit is not present. No tracheal deviation present.  Trach intact. No bloody sputum ot purulent sputum. No redness or swelling at insertion site  Cardiovascular: Regular rhythm and intact distal pulses.  Tachycardia present.  Exam reveals no gallop and no friction rub.   Murmur heard.  Systolic murmur is present with a grade of 1/6  +1 pitting RUE and b/l LE edema. No calf TTP  Pulmonary/Chest: Effort normal. No stridor. No respiratory distress. She has no wheezes. She has no rales.  Prolonged expiratory phase noted  Abdominal: Soft. Bowel sounds are normal. She exhibits no distension and no mass. There is no hepatomegaly. There is no tenderness. There is no rebound and no guarding.  Musculoskeletal: She exhibits edema.  Lymphadenopathy:    She has no cervical adenopathy.  Neurological: She is alert and oriented to person, place, and time. She has normal reflexes.  Skin: Skin is warm and dry. No rash noted.  Psychiatric: Her behavior is normal. Thought content normal. Her mood appears anxious.     Labs reviewed: Admission on 05/11/2016, Discharged on 05/12/2016  Component Date Value Ref Range Status  . Color, Urine 05/12/2016 YELLOW  YELLOW Final  . APPearance 05/12/2016 CLOUDY* CLEAR Final  . Specific Gravity, Urine 05/12/2016 1.022  1.005 - 1.030 Final  . pH 05/12/2016 5.5  5.0 - 8.0 Final  . Glucose, UA 05/12/2016 NEGATIVE  NEGATIVE mg/dL Final  . Hgb urine dipstick 05/12/2016 LARGE* NEGATIVE Final  . Bilirubin Urine 05/12/2016 NEGATIVE  NEGATIVE Final  . Ketones, ur  05/12/2016 NEGATIVE  NEGATIVE mg/dL Final  . Protein, ur 05/12/2016 NEGATIVE  NEGATIVE mg/dL Final  . Nitrite 05/12/2016 NEGATIVE  NEGATIVE Final  . Leukocytes, UA 05/12/2016 NEGATIVE  NEGATIVE Final  . Specimen Description 05/13/2016 URINE, RANDOM   Final  . Special Requests 05/13/2016 NONE   Final  . Culture 05/13/2016 NO GROWTH   Final  . Report Status 05/13/2016 05/13/2016 FINAL   Final  . Sodium 05/12/2016 142  135 - 145 mmol/L Final  . Potassium 05/12/2016 3.6  3.5 - 5.1 mmol/L Final  . Chloride 05/12/2016 108  101 - 111 mmol/L Final  . CO2 05/12/2016 26  22 - 32 mmol/L Final  . Glucose, Bld 05/12/2016 90  65 - 99 mg/dL Final  . BUN 05/12/2016 17  6 - 20 mg/dL Final  . Creatinine, Ser 05/12/2016 1.23* 0.44 - 1.00 mg/dL Final  . Calcium 05/12/2016 8.8* 8.9 - 10.3 mg/dL Final  . Total Protein 05/12/2016 6.9  6.5 - 8.1 g/dL Final  . Albumin 05/12/2016 3.5  3.5 - 5.0 g/dL Final  . AST 05/12/2016 18  15 - 41 U/L Final  . ALT 05/12/2016 15  14 - 54 U/L Final  . Alkaline Phosphatase 05/12/2016 62  38 - 126 U/L Final  . Total Bilirubin 05/12/2016 0.3  0.3 - 1.2 mg/dL Final  . GFR calc non Af Amer 05/12/2016 49* >60 mL/min Final  . GFR calc Af Amer 05/12/2016 57* >60 mL/min Final   Comment: (NOTE) The eGFR has been calculated using the CKD EPI equation. This calculation has not been validated in all clinical situations. eGFR's persistently <60 mL/min signify possible Chronic Kidney Disease.   . Anion gap 05/12/2016 8  5 - 15 Final  . WBC 05/12/2016 8.3  4.0 - 10.5 K/uL Final  . RBC 05/12/2016 4.07  3.87 - 5.11 MIL/uL Final  . Hemoglobin 05/12/2016 11.4* 12.0 -  15.0 g/dL Final  . HCT 05/12/2016 35.9* 36.0 - 46.0 % Final  . MCV 05/12/2016 88.2  78.0 - 100.0 fL Final  . MCH 05/12/2016 28.0  26.0 - 34.0 pg Final  . MCHC 05/12/2016 31.8  30.0 - 36.0 g/dL Final  . RDW 05/12/2016 14.1  11.5 - 15.5 % Final  . Platelets 05/12/2016 309  150 - 400 K/uL Final  . Neutrophils Relative %  05/12/2016 49  % Final  . Neutro Abs 05/12/2016 4.1  1.7 - 7.7 K/uL Final  . Lymphocytes Relative 05/12/2016 40  % Final  . Lymphs Abs 05/12/2016 3.3  0.7 - 4.0 K/uL Final  . Monocytes Relative 05/12/2016 7  % Final  . Monocytes Absolute 05/12/2016 0.6  0.1 - 1.0 K/uL Final  . Eosinophils Relative 05/12/2016 4  % Final  . Eosinophils Absolute 05/12/2016 0.3  0.0 - 0.7 K/uL Final  . Basophils Relative 05/12/2016 0  % Final  . Basophils Absolute 05/12/2016 0.0  0.0 - 0.1 K/uL Final  . Squamous Epithelial / LPF 05/12/2016 0-5* NONE SEEN Final  . WBC, UA 05/12/2016 NONE SEEN  0 - 5 WBC/hpf Final  . RBC / HPF 05/12/2016 6-30  0 - 5 RBC/hpf Final  . Bacteria, UA 05/12/2016 RARE* NONE SEEN Final  Admission on 04/12/2016, Discharged on 04/15/2016  Component Date Value Ref Range Status  . Sodium 04/12/2016 143  135 - 145 mmol/L Final  . Potassium 04/12/2016 3.7  3.5 - 5.1 mmol/L Final  . Chloride 04/12/2016 108  101 - 111 mmol/L Final  . CO2 04/12/2016 29  22 - 32 mmol/L Final  . Glucose, Bld 04/12/2016 105* 65 - 99 mg/dL Final  . BUN 04/12/2016 11  6 - 20 mg/dL Final  . Creatinine, Ser 04/12/2016 1.18* 0.44 - 1.00 mg/dL Final  . Calcium 04/12/2016 9.0  8.9 - 10.3 mg/dL Final  . Total Protein 04/12/2016 6.5  6.5 - 8.1 g/dL Final  . Albumin 04/12/2016 3.2* 3.5 - 5.0 g/dL Final  . AST 04/12/2016 18  15 - 41 U/L Final  . ALT 04/12/2016 18  14 - 54 U/L Final  . Alkaline Phosphatase 04/12/2016 56  38 - 126 U/L Final  . Total Bilirubin 04/12/2016 0.5  0.3 - 1.2 mg/dL Final  . GFR calc non Af Amer 04/12/2016 51* >60 mL/min Final  . GFR calc Af Amer 04/12/2016 59* >60 mL/min Final   Comment: (NOTE) The eGFR has been calculated using the CKD EPI equation. This calculation has not been validated in all clinical situations. eGFR's persistently <60 mL/min signify possible Chronic Kidney Disease.   . Anion gap 04/12/2016 6  5 - 15 Final  . Lactic Acid, Venous 04/12/2016 0.84  0.5 - 1.9 mmol/L Final    . WBC 04/12/2016 7.9  4.0 - 10.5 K/uL Final  . RBC 04/12/2016 3.91  3.87 - 5.11 MIL/uL Final  . Hemoglobin 04/12/2016 10.8* 12.0 - 15.0 g/dL Final  . HCT 04/12/2016 35.5* 36.0 - 46.0 % Final  . MCV 04/12/2016 90.8  78.0 - 100.0 fL Final  . MCH 04/12/2016 27.6  26.0 - 34.0 pg Final  . MCHC 04/12/2016 30.4  30.0 - 36.0 g/dL Final  . RDW 04/12/2016 14.6  11.5 - 15.5 % Final  . Platelets 04/12/2016 319  150 - 400 K/uL Final  . Neutrophils Relative % 04/12/2016 58  % Final  . Neutro Abs 04/12/2016 4.6  1.7 - 7.7 K/uL Final  . Lymphocytes Relative 04/12/2016 33  %  Final  . Lymphs Abs 04/12/2016 2.6  0.7 - 4.0 K/uL Final  . Monocytes Relative 04/12/2016 7  % Final  . Monocytes Absolute 04/12/2016 0.5  0.1 - 1.0 K/uL Final  . Eosinophils Relative 04/12/2016 2  % Final  . Eosinophils Absolute 04/12/2016 0.2  0.0 - 0.7 K/uL Final  . Basophils Relative 04/12/2016 0  % Final  . Basophils Absolute 04/12/2016 0.0  0.0 - 0.1 K/uL Final  . Specimen Description 04/17/2016 BLOOD LEFT ARM   Final  . Special Requests 04/17/2016 BOTTLES DRAWN AEROBIC ONLY 5CC   Final  . Culture 04/17/2016 NO GROWTH 5 DAYS   Final  . Report Status 04/17/2016 04/17/2016 FINAL   Final  . Specimen Description 04/17/2016 BLOOD LEFT HAND   Final  . Special Requests 04/17/2016 BOTTLES DRAWN AEROBIC ONLY 5CC   Final  . Culture 04/17/2016 NO GROWTH 5 DAYS   Final  . Report Status 04/17/2016 04/17/2016 FINAL   Final  . Specimen Description 04/14/2016 URINE, RANDOM   Final  . Special Requests 04/14/2016 NONE   Final  . Culture 04/14/2016 MULTIPLE SPECIES PRESENT, SUGGEST RECOLLECTION*  Final  . Report Status 04/14/2016 04/14/2016 FINAL   Final  . Color, Urine 04/13/2016 YELLOW  YELLOW Final  . APPearance 04/13/2016 HAZY* CLEAR Final  . Specific Gravity, Urine 04/13/2016 1.027  1.005 - 1.030 Final  . pH 04/13/2016 5.5  5.0 - 8.0 Final  . Glucose, UA 04/13/2016 NEGATIVE  NEGATIVE mg/dL Final  . Hgb urine dipstick 04/13/2016  MODERATE* NEGATIVE Final  . Bilirubin Urine 04/13/2016 NEGATIVE  NEGATIVE Final  . Ketones, ur 04/13/2016 NEGATIVE  NEGATIVE mg/dL Final  . Protein, ur 04/13/2016 NEGATIVE  NEGATIVE mg/dL Final  . Nitrite 04/13/2016 NEGATIVE  NEGATIVE Final  . Leukocytes, UA 04/13/2016 NEGATIVE  NEGATIVE Final  . Troponin i, poc 04/12/2016 0.00  0.00 - 0.08 ng/mL Final  . Comment 3 04/12/2016          Final   Comment: Due to the release kinetics of cTnI, a negative result within the first hours of the onset of symptoms does not rule out myocardial infarction with certainty. If myocardial infarction is still suspected, repeat the test at appropriate intervals.   . B Natriuretic Peptide 04/12/2016 69.0  0.0 - 100.0 pg/mL Final  . Total CK 04/12/2016 120  38 - 234 U/L Final  . TSH 04/13/2016 0.720  0.350 - 4.500 uIU/mL Final  . Total CK 04/13/2016 103  38 - 234 U/L Final  . pH, Arterial 04/13/2016 7.304* 7.350 - 7.450 Final  . pCO2 arterial 04/13/2016 55.7* 32.0 - 48.0 mmHg Final  . pO2, Arterial 04/13/2016 80.0* 83.0 - 108.0 mmHg Final  . Bicarbonate 04/13/2016 27.8  20.0 - 28.0 mmol/L Final  . TCO2 04/13/2016 29  0 - 100 mmol/L Final  . O2 Saturation 04/13/2016 94.0  % Final  . Acid-Base Excess 04/13/2016 1.0  0.0 - 2.0 mmol/L Final  . Patient temperature 04/13/2016 98.0 F   Final  . Collection site 04/13/2016 RADIAL, ALLEN'S TEST ACCEPTABLE   Final  . Drawn by 04/13/2016 RT   Final  . Sample type 04/13/2016 ARTERIAL   Final  . Squamous Epithelial / LPF 04/13/2016 6-30* NONE SEEN Final  . WBC, UA 04/13/2016 6-30  0 - 5 WBC/hpf Final  . RBC / HPF 04/13/2016 6-30  0 - 5 RBC/hpf Final  . Bacteria, UA 04/13/2016 FEW* NONE SEEN Final  . Glucose-Capillary 04/13/2016 88  65 - 99 mg/dL Final  .  Glucose-Capillary 04/13/2016 88  65 - 99 mg/dL Final  . Sodium 04/14/2016 142  135 - 145 mmol/L Final  . Potassium 04/14/2016 3.8  3.5 - 5.1 mmol/L Final  . Chloride 04/14/2016 109  101 - 111 mmol/L Final  . CO2  04/14/2016 28  22 - 32 mmol/L Final  . Glucose, Bld 04/14/2016 86  65 - 99 mg/dL Final  . BUN 04/14/2016 12  6 - 20 mg/dL Final  . Creatinine, Ser 04/14/2016 0.96  0.44 - 1.00 mg/dL Final  . Calcium 04/14/2016 8.5* 8.9 - 10.3 mg/dL Final  . GFR calc non Af Amer 04/14/2016 >60  >60 mL/min Final  . GFR calc Af Amer 04/14/2016 >60  >60 mL/min Final   Comment: (NOTE) The eGFR has been calculated using the CKD EPI equation. This calculation has not been validated in all clinical situations. eGFR's persistently <60 mL/min signify possible Chronic Kidney Disease.   . Anion gap 04/14/2016 5  5 - 15 Final  . WBC 04/14/2016 7.2  4.0 - 10.5 K/uL Final  . RBC 04/14/2016 3.73* 3.87 - 5.11 MIL/uL Final  . Hemoglobin 04/14/2016 10.4* 12.0 - 15.0 g/dL Final  . HCT 04/14/2016 33.8* 36.0 - 46.0 % Final  . MCV 04/14/2016 90.6  78.0 - 100.0 fL Final  . MCH 04/14/2016 27.9  26.0 - 34.0 pg Final  . MCHC 04/14/2016 30.8  30.0 - 36.0 g/dL Final  . RDW 04/14/2016 14.5  11.5 - 15.5 % Final  . Platelets 04/14/2016 283  150 - 400 K/uL Final  . Glucose-Capillary 04/13/2016 114* 65 - 99 mg/dL Final  . Glucose-Capillary 04/14/2016 87  65 - 99 mg/dL Final  . Glucose-Capillary 04/14/2016 110* 65 - 99 mg/dL Final  . Glucose-Capillary 04/14/2016 110* 65 - 99 mg/dL Final  . Glucose-Capillary 04/14/2016 90  65 - 99 mg/dL Final  . Glucose-Capillary 04/15/2016 95  65 - 99 mg/dL Final  . Glucose-Capillary 04/15/2016 119* 65 - 99 mg/dL Final  Nursing Home on 03/22/2016  Component Date Value Ref Range Status  . Hemoglobin 03/18/2016 10.7* 12.0 - 16.0 g/dL Final  . HCT 03/18/2016 32* 36 - 46 % Final  . Neutrophils Absolute 03/18/2016 2  /L Final  . Platelets 03/18/2016 316  150 - 399 K/L Final  . WBC 03/18/2016 5.8  10^3/mL Final  . Glucose 03/18/2016 83  mg/dL Final  . BUN 03/18/2016 20  4 - 21 mg/dL Final  . Creatinine 03/18/2016 1.2* 0.5 - 1.1 mg/dL Final  . Potassium 03/18/2016 3.9  3.4 - 5.3 mmol/L Final  .  Sodium 03/18/2016 144  137 - 147 mmol/L Final  . Alkaline Phosphatase 03/18/2016 63  25 - 125 U/L Final  . ALT 03/18/2016 16  7 - 35 U/L Final  . AST 03/18/2016 13  13 - 35 U/L Final  . Bilirubin, Total 03/18/2016 0.3  mg/dL Final  Admission on 03/18/2016, Discharged on 03/19/2016  Component Date Value Ref Range Status  . WBC 03/18/2016 8.0  4.0 - 10.5 K/uL Final  . RBC 03/18/2016 3.91  3.87 - 5.11 MIL/uL Final  . Hemoglobin 03/18/2016 11.0* 12.0 - 15.0 g/dL Final  . HCT 03/18/2016 34.5* 36.0 - 46.0 % Final  . MCV 03/18/2016 88.2  78.0 - 100.0 fL Final  . MCH 03/18/2016 28.1  26.0 - 34.0 pg Final  . MCHC 03/18/2016 31.9  30.0 - 36.0 g/dL Final  . RDW 03/18/2016 15.2  11.5 - 15.5 % Final  . Platelets 03/18/2016 298  150 -  400 K/uL Final  . Neutrophils Relative % 03/18/2016 48  % Final  . Neutro Abs 03/18/2016 3.9  1.7 - 7.7 K/uL Final  . Lymphocytes Relative 03/18/2016 40  % Final  . Lymphs Abs 03/18/2016 3.2  0.7 - 4.0 K/uL Final  . Monocytes Relative 03/18/2016 8  % Final  . Monocytes Absolute 03/18/2016 0.7  0.1 - 1.0 K/uL Final  . Eosinophils Relative 03/18/2016 3  % Final  . Eosinophils Absolute 03/18/2016 0.2  0.0 - 0.7 K/uL Final  . Basophils Relative 03/18/2016 1  % Final  . Basophils Absolute 03/18/2016 0.1  0.0 - 0.1 K/uL Final  . Sodium 03/18/2016 140  135 - 145 mmol/L Final  . Potassium 03/18/2016 3.8  3.5 - 5.1 mmol/L Final  . Chloride 03/18/2016 107  101 - 111 mmol/L Final  . CO2 03/18/2016 26  22 - 32 mmol/L Final  . Glucose, Bld 03/18/2016 97  65 - 99 mg/dL Final  . BUN 03/18/2016 17  6 - 20 mg/dL Final  . Creatinine, Ser 03/18/2016 1.24* 0.44 - 1.00 mg/dL Final  . Calcium 03/18/2016 9.0  8.9 - 10.3 mg/dL Final  . Total Protein 03/18/2016 6.7  6.5 - 8.1 g/dL Final  . Albumin 03/18/2016 3.5  3.5 - 5.0 g/dL Final  . AST 03/18/2016 21  15 - 41 U/L Final  . ALT 03/18/2016 21  14 - 54 U/L Final  . Alkaline Phosphatase 03/18/2016 59  38 - 126 U/L Final  . Total  Bilirubin 03/18/2016 0.4  0.3 - 1.2 mg/dL Final  . GFR calc non Af Amer 03/18/2016 48* >60 mL/min Final  . GFR calc Af Amer 03/18/2016 56* >60 mL/min Final   Comment: (NOTE) The eGFR has been calculated using the CKD EPI equation. This calculation has not been validated in all clinical situations. eGFR's persistently <60 mL/min signify possible Chronic Kidney Disease.   . Anion gap 03/18/2016 7  5 - 15 Final  . Lactic Acid, Venous 03/18/2016 0.85  0.5 - 1.9 mmol/L Final  . Troponin i, poc 03/18/2016 0.00  0.00 - 0.08 ng/mL Final  . Comment 3 03/18/2016          Final   Comment: Due to the release kinetics of cTnI, a negative result within the first hours of the onset of symptoms does not rule out myocardial infarction with certainty. If myocardial infarction is still suspected, repeat the test at appropriate intervals.   Admission on 03/06/2016, Discharged on 03/11/2016  Component Date Value Ref Range Status  . Sodium 03/06/2016 140  135 - 145 mmol/L Final  . Potassium 03/06/2016 3.1* 3.5 - 5.1 mmol/L Final  . Chloride 03/06/2016 105  101 - 111 mmol/L Final  . CO2 03/06/2016 26  22 - 32 mmol/L Final  . Glucose, Bld 03/06/2016 110* 65 - 99 mg/dL Final  . BUN 03/06/2016 16  6 - 20 mg/dL Final  . Creatinine, Ser 03/06/2016 1.26* 0.44 - 1.00 mg/dL Final  . Calcium 03/06/2016 9.5  8.9 - 10.3 mg/dL Final  . GFR calc non Af Amer 03/06/2016 47* >60 mL/min Final  . GFR calc Af Amer 03/06/2016 55* >60 mL/min Final   Comment: (NOTE) The eGFR has been calculated using the CKD EPI equation. This calculation has not been validated in all clinical situations. eGFR's persistently <60 mL/min signify possible Chronic Kidney Disease.   . Anion gap 03/06/2016 9  5 - 15 Final  . WBC 03/06/2016 7.1  4.0 - 10.5 K/uL Final  .  RBC 03/06/2016 4.18  3.87 - 5.11 MIL/uL Final  . Hemoglobin 03/06/2016 11.8* 12.0 - 15.0 g/dL Final  . HCT 03/06/2016 36.8  36.0 - 46.0 % Final  . MCV 03/06/2016 88.0  78.0 -  100.0 fL Final  . MCH 03/06/2016 28.2  26.0 - 34.0 pg Final  . MCHC 03/06/2016 32.1  30.0 - 36.0 g/dL Final  . RDW 03/06/2016 14.1  11.5 - 15.5 % Final  . Platelets 03/06/2016 396  150 - 400 K/uL Final  . Troponin i, poc 03/06/2016 0.01  0.00 - 0.08 ng/mL Final  . Comment 3 03/06/2016          Final   Comment: Due to the release kinetics of cTnI, a negative result within the first hours of the onset of symptoms does not rule out myocardial infarction with certainty. If myocardial infarction is still suspected, repeat the test at appropriate intervals.   Marland Kitchen D-Dimer, Quant 03/06/2016 0.36  0.00 - 0.50 ug/mL-FEU Final   Comment: (NOTE) At the manufacturer cut-off of 0.50 ug/mL FEU, this assay has been documented to exclude PE with a sensitivity and negative predictive value of 97 to 99%.  At this time, this assay has not been approved by the FDA to exclude DVT/VTE. Results should be correlated with clinical presentation.   Marland Kitchen Specimen Description 03/11/2016 BLOOD RIGHT ANTECUBITAL   Final  . Special Requests 03/11/2016 BOTTLES DRAWN AEROBIC AND ANAEROBIC 5CC   Final  . Culture 03/11/2016 NO GROWTH 5 DAYS   Final  . Report Status 03/11/2016 03/11/2016 FINAL   Final  . Specimen Description 03/11/2016 BLOOD LEFT ANTECUBITAL   Final  . Special Requests 03/11/2016 BOTTLES DRAWN AEROBIC AND ANAEROBIC 5CC   Final  . Culture 03/11/2016 NO GROWTH 5 DAYS   Final  . Report Status 03/11/2016 03/11/2016 FINAL   Final  . Lactic Acid, Venous 03/06/2016 3.01* 0.5 - 1.9 mmol/L Final  . Comment 03/06/2016 NOTIFIED PHYSICIAN   Final  . pH, Ven 03/06/2016 7.541* 7.250 - 7.300 Final  . pCO2, Ven 03/06/2016 34.0* 45.0 - 50.0 mmHg Final  . pO2, Ven 03/06/2016 30.0* 31.0 - 45.0 mmHg Final  . Bicarbonate 03/06/2016 29.2* 20.0 - 24.0 mEq/L Final  . TCO2 03/06/2016 30  0 - 100 mmol/L Final  . O2 Saturation 03/06/2016 66.0  % Final  . Acid-Base Excess 03/06/2016 7.0* 0.0 - 2.0 mmol/L Final  . Patient  temperature 03/06/2016 HIDE   Final  . Sample type 03/06/2016 VENOUS   Final  . Comment 03/06/2016 NOTIFIED PHYSICIAN   Final  . Lactic Acid, Venous 03/06/2016 2.18* 0.5 - 1.9 mmol/L Final  . Comment 03/06/2016 NOTIFIED PHYSICIAN   Final  . Specimen Description 03/10/2016 URINE, RANDOM   Final  . Special Requests 03/10/2016 NONE   Final  . Culture 03/10/2016 40,000 COLONIES/mL ENTEROCOCCUS SPECIES*  Final  . Report Status 03/10/2016 03/10/2016 FINAL   Final  . Organism ID, Bacteria 03/10/2016 ENTEROCOCCUS SPECIES*  Final  . L. pneumophila Serogp 1 Ur Ag 03/08/2016 Negative  Negative Final   Comment: (NOTE) Presumptive negative for L. pneumophila serogroup 1 antigen in urine, suggesting no recent or current infection. Legionnaires' disease cannot be ruled out since other serogroups and species may also cause disease. Performed At: [facility] [place], [place] [id number] Lindon Romp MD LG:[id number]   . Source of Sample 03/08/2016 URINE, RANDOM   Final  . Strep Pneumo Urinary Antigen 03/07/2016 NEGATIVE  NEGATIVE Final   Comment:  Infection due to S. pneumoniae cannot be absolutely ruled out since the antigen present may be below the detection limit of the test.   . Specimen Description 03/09/2016 TRACHEAL ASPIRATE   Final  . Special Requests 03/09/2016 NONE   Final  . Gram Stain 03/09/2016    Final                   Value:FEW WBC PRESENT, PREDOMINANTLY PMN RARE GRAM POSITIVE RODS   . Culture 03/09/2016 FEW ENTEROBACTER AEROGENES   Final  . Report Status 03/09/2016 03/09/2016 FINAL   Final  . Organism ID, Bacteria 03/09/2016 ENTEROBACTER AEROGENES   Final  . Sodium 03/07/2016 141  135 - 145 mmol/L Final  . Potassium 03/07/2016 3.4* 3.5 - 5.1 mmol/L Final  . Chloride 03/07/2016 108  101 - 111 mmol/L Final  . CO2 03/07/2016 27  22 - 32 mmol/L Final  . Glucose, Bld 03/07/2016 127* 65 - 99 mg/dL Final  . BUN 03/07/2016 13  6 - 20 mg/dL Final  .  Creatinine, Ser 03/07/2016 1.26* 0.44 - 1.00 mg/dL Final  . Calcium 03/07/2016 8.8* 8.9 - 10.3 mg/dL Final  . Total Protein 03/07/2016 6.5  6.5 - 8.1 g/dL Final  . Albumin 03/07/2016 2.9* 3.5 - 5.0 g/dL Final  . AST 03/07/2016 16  15 - 41 U/L Final  . ALT 03/07/2016 16  14 - 54 U/L Final  . Alkaline Phosphatase 03/07/2016 60  38 - 126 U/L Final  . Total Bilirubin 03/07/2016 0.6  0.3 - 1.2 mg/dL Final  . GFR calc non Af Amer 03/07/2016 47* >60 mL/min Final  . GFR calc Af Amer 03/07/2016 55* >60 mL/min Final   Comment: (NOTE) The eGFR has been calculated using the CKD EPI equation. This calculation has not been validated in all clinical situations. eGFR's persistently <60 mL/min signify possible Chronic Kidney Disease.   . Anion gap 03/07/2016 6  5 - 15 Final  . WBC 03/07/2016 6.4  4.0 - 10.5 K/uL Final  . RBC 03/07/2016 3.72* 3.87 - 5.11 MIL/uL Final  . Hemoglobin 03/07/2016 10.3* 12.0 - 15.0 g/dL Final  . HCT 03/07/2016 32.9* 36.0 - 46.0 % Final  . MCV 03/07/2016 88.4  78.0 - 100.0 fL Final  . MCH 03/07/2016 27.7  26.0 - 34.0 pg Final  . MCHC 03/07/2016 31.3  30.0 - 36.0 g/dL Final  . RDW 03/07/2016 14.3  11.5 - 15.5 % Final  . Platelets 03/07/2016 311  150 - 400 K/uL Final  . Prothrombin Time 03/07/2016 15.8* 11.4 - 15.2 seconds Final  . INR 03/07/2016 1.26   Final  . Glucose-Capillary 03/07/2016 143* 65 - 99 mg/dL Final  . Glucose-Capillary 03/07/2016 140* 65 - 99 mg/dL Final  . Glucose-Capillary 03/07/2016 153* 65 - 99 mg/dL Final  . Comment 1 03/07/2016 Notify RN   Final  . Comment 2 03/07/2016 Document in Chart   Final  . Sodium 03/08/2016 143  135 - 145 mmol/L Final  . Potassium 03/08/2016 3.8  3.5 - 5.1 mmol/L Final  . Chloride 03/08/2016 110  101 - 111 mmol/L Final  . CO2 03/08/2016 26  22 - 32 mmol/L Final  . Glucose, Bld 03/08/2016 122* 65 - 99 mg/dL Final  . BUN 03/08/2016 13  6 - 20 mg/dL Final  . Creatinine, Ser 03/08/2016 1.28* 0.44 - 1.00 mg/dL Final  . Calcium  03/08/2016 9.0  8.9 - 10.3 mg/dL Final  . GFR calc non Af Amer 03/08/2016 47* >60 mL/min  Final  . GFR calc Af Amer 03/08/2016 54* >60 mL/min Final   Comment: (NOTE) The eGFR has been calculated using the CKD EPI equation. This calculation has not been validated in all clinical situations. eGFR's persistently <60 mL/min signify possible Chronic Kidney Disease.   . Anion gap 03/08/2016 7  5 - 15 Final  . Magnesium 03/08/2016 1.9  1.7 - 2.4 mg/dL Final  . Glucose-Capillary 03/07/2016 120* 65 - 99 mg/dL Final  . Glucose-Capillary 03/08/2016 117* 65 - 99 mg/dL Final  . Glucose-Capillary 03/08/2016 173* 65 - 99 mg/dL Final  . Glucose-Capillary 03/08/2016 88  65 - 99 mg/dL Final  . Glucose-Capillary 03/08/2016 138* 65 - 99 mg/dL Final  . Glucose-Capillary 03/09/2016 122* 65 - 99 mg/dL Final  . Glucose-Capillary 03/09/2016 117* 65 - 99 mg/dL Final  . Glucose-Capillary 03/09/2016 98  65 - 99 mg/dL Final  . Glucose-Capillary 03/09/2016 142* 65 - 99 mg/dL Final  . Glucose-Capillary 03/10/2016 112* 65 - 99 mg/dL Final  . Glucose-Capillary 03/10/2016 135* 65 - 99 mg/dL Final  . Sodium 03/11/2016 141  135 - 145 mmol/L Final  . Potassium 03/11/2016 3.9  3.5 - 5.1 mmol/L Final  . Chloride 03/11/2016 110  101 - 111 mmol/L Final  . CO2 03/11/2016 26  22 - 32 mmol/L Final  . Glucose, Bld 03/11/2016 106* 65 - 99 mg/dL Final  . BUN 03/11/2016 13  6 - 20 mg/dL Final  . Creatinine, Ser 03/11/2016 1.11* 0.44 - 1.00 mg/dL Final  . Calcium 03/11/2016 8.9  8.9 - 10.3 mg/dL Final  . Total Protein 03/11/2016 6.0* 6.5 - 8.1 g/dL Final  . Albumin 03/11/2016 2.9* 3.5 - 5.0 g/dL Final  . AST 03/11/2016 24  15 - 41 U/L Final  . ALT 03/11/2016 23  14 - 54 U/L Final  . Alkaline Phosphatase 03/11/2016 53  38 - 126 U/L Final  . Total Bilirubin 03/11/2016 0.3  0.3 - 1.2 mg/dL Final  . GFR calc non Af Amer 03/11/2016 55* >60 mL/min Final  . GFR calc Af Amer 03/11/2016 >60  >60 mL/min Final   Comment: (NOTE) The eGFR  has been calculated using the CKD EPI equation. This calculation has not been validated in all clinical situations. eGFR's persistently <60 mL/min signify possible Chronic Kidney Disease.   . Anion gap 03/11/2016 5  5 - 15 Final  . WBC 03/11/2016 7.4  4.0 - 10.5 K/uL Final  . RBC 03/11/2016 3.42* 3.87 - 5.11 MIL/uL Final  . Hemoglobin 03/11/2016 9.4* 12.0 - 15.0 g/dL Final  . HCT 03/11/2016 30.2* 36.0 - 46.0 % Final  . MCV 03/11/2016 88.3  78.0 - 100.0 fL Final  . MCH 03/11/2016 27.5  26.0 - 34.0 pg Final  . MCHC 03/11/2016 31.1  30.0 - 36.0 g/dL Final  . RDW 03/11/2016 14.7  11.5 - 15.5 % Final  . Platelets 03/11/2016 294  150 - 400 K/uL Final  . Neutrophils Relative % 03/11/2016 49  % Final  . Neutro Abs 03/11/2016 3.6  1.7 - 7.7 K/uL Final  . Lymphocytes Relative 03/11/2016 40  % Final  . Lymphs Abs 03/11/2016 2.9  0.7 - 4.0 K/uL Final  . Monocytes Relative 03/11/2016 7  % Final  . Monocytes Absolute 03/11/2016 0.5  0.1 - 1.0 K/uL Final  . Eosinophils Relative 03/11/2016 4  % Final  . Eosinophils Absolute 03/11/2016 0.3  0.0 - 0.7 K/uL Final  . Basophils Relative 03/11/2016 0  % Final  . Basophils Absolute 03/11/2016  0.0  0.0 - 0.1 K/uL Final  . Glucose-Capillary 03/10/2016 105* 65 - 99 mg/dL Final  . Glucose-Capillary 03/10/2016 122* 65 - 99 mg/dL Final  . Glucose-Capillary 03/11/2016 100* 65 - 99 mg/dL Final  . Comment 1 03/11/2016 Document in Chart   Final  . Glucose-Capillary 03/11/2016 120* 65 - 99 mg/dL Final  . Glucose-Capillary 03/11/2016 105* 65 - 99 mg/dL Final  Admission on 02/21/2016, Discharged on 02/25/2016  Component Date Value Ref Range Status  . Sodium 02/21/2016 137  135 - 145 mmol/L Final  . Potassium 02/21/2016 4.4  3.5 - 5.1 mmol/L Final  . Chloride 02/21/2016 100* 101 - 111 mmol/L Final  . CO2 02/21/2016 29  22 - 32 mmol/L Final  . Glucose, Bld 02/21/2016 107* 65 - 99 mg/dL Final  . BUN 02/21/2016 9  6 - 20 mg/dL Final  . Creatinine, Ser 02/21/2016  1.13* 0.44 - 1.00 mg/dL Final  . Calcium 02/21/2016 8.9  8.9 - 10.3 mg/dL Final  . Total Protein 02/21/2016 7.2  6.5 - 8.1 g/dL Final  . Albumin 02/21/2016 3.0* 3.5 - 5.0 g/dL Final  . AST 02/21/2016 29  15 - 41 U/L Final  . ALT 02/21/2016 33  14 - 54 U/L Final  . Alkaline Phosphatase 02/21/2016 101  38 - 126 U/L Final  . Total Bilirubin 02/21/2016 1.0  0.3 - 1.2 mg/dL Final  . GFR calc non Af Amer 02/21/2016 54* >60 mL/min Final  . GFR calc Af Amer 02/21/2016 >60  >60 mL/min Final   Comment: (NOTE) The eGFR has been calculated using the CKD EPI equation. This calculation has not been validated in all clinical situations. eGFR's persistently <60 mL/min signify possible Chronic Kidney Disease.   . Anion gap 02/21/2016 8  5 - 15 Final  . WBC 02/21/2016 11.8* 4.0 - 10.5 K/uL Final  . RBC 02/21/2016 4.23  3.87 - 5.11 MIL/uL Final  . Hemoglobin 02/21/2016 12.1  12.0 - 15.0 g/dL Final  . HCT 02/21/2016 36.9  36.0 - 46.0 % Final  . MCV 02/21/2016 87.2  78.0 - 100.0 fL Final  . MCH 02/21/2016 28.6  26.0 - 34.0 pg Final  . MCHC 02/21/2016 32.8  30.0 - 36.0 g/dL Final  . RDW 02/21/2016 13.8  11.5 - 15.5 % Final  . Platelets 02/21/2016 404* 150 - 400 K/uL Final  . Neutrophils Relative % 02/21/2016 51  % Final  . Neutro Abs 02/21/2016 6.0  1.7 - 7.7 K/uL Final  . Lymphocytes Relative 02/21/2016 37  % Final  . Lymphs Abs 02/21/2016 4.4* 0.7 - 4.0 K/uL Final  . Monocytes Relative 02/21/2016 10  % Final  . Monocytes Absolute 02/21/2016 1.1* 0.1 - 1.0 K/uL Final  . Eosinophils Relative 02/21/2016 2  % Final  . Eosinophils Absolute 02/21/2016 0.3  0.0 - 0.7 K/uL Final  . Basophils Relative 02/21/2016 0  % Final  . Basophils Absolute 02/21/2016 0.0  0.0 - 0.1 K/uL Final  . Troponin i, poc 02/21/2016 0.01  0.00 - 0.08 ng/mL Final  . Comment 3 02/21/2016          Final   Comment: Due to the release kinetics of cTnI, a negative result within the first hours of the onset of symptoms does not rule  out myocardial infarction with certainty. If myocardial infarction is still suspected, repeat the test at appropriate intervals.   . Lactic Acid, Venous 02/21/2016 1.13  0.5 - 1.9 mmol/L Final  . Specimen Description 02/26/2016 BLOOD LEFT  ANTECUBITAL   Final  . Special Requests 02/26/2016 BOTTLES DRAWN AEROBIC AND ANAEROBIC 5CC   Final  . Culture 02/26/2016 NO GROWTH 5 DAYS   Final  . Report Status 02/26/2016 02/26/2016 FINAL   Final  . Specimen Description 02/26/2016 BLOOD LEFT HAND   Final  . Special Requests 02/26/2016 BOTTLES DRAWN AEROBIC AND ANAEROBIC 5CC   Final  . Culture 02/26/2016 NO GROWTH 5 DAYS   Final  . Report Status 02/26/2016 02/26/2016 FINAL   Final  . HIV Screen 4th Generation wRfx 02/22/2016 Non Reactive  Non Reactive Final   Comment: (NOTE) Performed At: [facility] [place], [place] [id number] Lindon Romp MD RF:[id number]   . Sodium 02/22/2016 139  135 - 145 mmol/L Final  . Potassium 02/22/2016 3.6  3.5 - 5.1 mmol/L Final  . Chloride 02/22/2016 97* 101 - 111 mmol/L Final  . CO2 02/22/2016 30  22 - 32 mmol/L Final  . Glucose, Bld 02/22/2016 138* 65 - 99 mg/dL Final  . BUN 02/22/2016 11  6 - 20 mg/dL Final  . Creatinine, Ser 02/22/2016 1.54* 0.44 - 1.00 mg/dL Final  . Calcium 02/22/2016 9.1  8.9 - 10.3 mg/dL Final  . GFR calc non Af Amer 02/22/2016 37* >60 mL/min Final  . GFR calc Af Amer 02/22/2016 43* >60 mL/min Final   Comment: (NOTE) The eGFR has been calculated using the CKD EPI equation. This calculation has not been validated in all clinical situations. eGFR's persistently <60 mL/min signify possible Chronic Kidney Disease.   . Anion gap 02/22/2016 12  5 - 15 Final  . Glucose-Capillary 02/22/2016 124* 65 - 99 mg/dL Final  . Hemoglobin 02/22/2016 10.4* 12.0 - 15.0 g/dL Final  . HCT 02/22/2016 33.3* 36.0 - 46.0 % Final  . Hemoglobin 02/22/2016 10.4* 12.0 - 15.0 g/dL Final  . HCT 02/22/2016 32.4* 36.0 - 46.0 % Final  .  Hemoglobin 02/22/2016 10.6* 12.0 - 15.0 g/dL Final  . HCT 02/22/2016 33.5* 36.0 - 46.0 % Final  . Hemoglobin 02/22/2016 10.3* 12.0 - 15.0 g/dL Final  . HCT 02/22/2016 32.1* 36.0 - 46.0 % Final  . ABO/RH(D) 02/22/2016 O POS   Final  . Antibody Screen 02/22/2016 NEG   Final  . Sample Expiration 02/22/2016 02/25/2016   Final  . ABO/RH(D) 02/22/2016 O POS   Final  . Troponin I 02/22/2016 0.03* <0.03 ng/mL Final   Comment: CRITICAL RESULT CALLED TO, READ BACK BY AND VERIFIED WITH: RN I HABIB AT [id number] MARTINB   . WBC 02/23/2016 9.6  4.0 - 10.5 K/uL Final  . RBC 02/23/2016 3.61* 3.87 - 5.11 MIL/uL Final  . Hemoglobin 02/23/2016 9.8* 12.0 - 15.0 g/dL Final  . HCT 02/23/2016 31.8* 36.0 - 46.0 % Final  . MCV 02/23/2016 88.1  78.0 - 100.0 fL Final  . MCH 02/23/2016 27.1  26.0 - 34.0 pg Final  . MCHC 02/23/2016 30.8  30.0 - 36.0 g/dL Final  . RDW 02/23/2016 14.1  11.5 - 15.5 % Final  . Platelets 02/23/2016 350  150 - 400 K/uL Final  . Sodium 02/23/2016 139  135 - 145 mmol/L Final  . Potassium 02/23/2016 3.4* 3.5 - 5.1 mmol/L Final  . Chloride 02/23/2016 101  101 - 111 mmol/L Final  . CO2 02/23/2016 29  22 - 32 mmol/L Final  . Glucose, Bld 02/23/2016 115* 65 - 99 mg/dL Final  . BUN 02/23/2016 16  6 - 20 mg/dL Final  . Creatinine, Ser 02/23/2016 1.53* 0.44 -  1.00 mg/dL Final  . Calcium 02/23/2016 8.8* 8.9 - 10.3 mg/dL Final  . GFR calc non Af Amer 02/23/2016 37* >60 mL/min Final  . GFR calc Af Amer 02/23/2016 43* >60 mL/min Final   Comment: (NOTE) The eGFR has been calculated using the CKD EPI equation. This calculation has not been validated in all clinical situations. eGFR's persistently <60 mL/min signify possible Chronic Kidney Disease.   . Anion gap 02/23/2016 9  5 - 15 Final  . WBC 02/24/2016 7.7  4.0 - 10.5 K/uL Final  . RBC 02/24/2016 3.69* 3.87 - 5.11 MIL/uL Final  . Hemoglobin 02/24/2016 10.2* 12.0 - 15.0 g/dL Final  . HCT 02/24/2016 32.8* 36.0 - 46.0 % Final  . MCV  02/24/2016 88.9  78.0 - 100.0 fL Final  . MCH 02/24/2016 27.6  26.0 - 34.0 pg Final  . MCHC 02/24/2016 31.1  30.0 - 36.0 g/dL Final  . RDW 02/24/2016 14.1  11.5 - 15.5 % Final  . Platelets 02/24/2016 350  150 - 400 K/uL Final  . Sodium 02/24/2016 140  135 - 145 mmol/L Final  . Potassium 02/24/2016 3.5  3.5 - 5.1 mmol/L Final  . Chloride 02/24/2016 104  101 - 111 mmol/L Final  . CO2 02/24/2016 27  22 - 32 mmol/L Final  . Glucose, Bld 02/24/2016 105* 65 - 99 mg/dL Final  . BUN 02/24/2016 11  6 - 20 mg/dL Final  . Creatinine, Ser 02/24/2016 1.17* 0.44 - 1.00 mg/dL Final  . Calcium 02/24/2016 8.8* 8.9 - 10.3 mg/dL Final  . GFR calc non Af Amer 02/24/2016 52* >60 mL/min Final  . GFR calc Af Amer 02/24/2016 >60  >60 mL/min Final   Comment: (NOTE) The eGFR has been calculated using the CKD EPI equation. This calculation has not been validated in all clinical situations. eGFR's persistently <60 mL/min signify possible Chronic Kidney Disease.   . Anion gap 02/24/2016 9  5 - 15 Final  . Sodium 02/25/2016 141  135 - 145 mmol/L Final  . Potassium 02/25/2016 3.6  3.5 - 5.1 mmol/L Final  . Chloride 02/25/2016 102  101 - 111 mmol/L Final  . CO2 02/25/2016 30  22 - 32 mmol/L Final  . Glucose, Bld 02/25/2016 105* 65 - 99 mg/dL Final  . BUN 02/25/2016 11  6 - 20 mg/dL Final  . Creatinine, Ser 02/25/2016 1.11* 0.44 - 1.00 mg/dL Final  . Calcium 02/25/2016 9.0  8.9 - 10.3 mg/dL Final  . GFR calc non Af Amer 02/25/2016 55* >60 mL/min Final  . GFR calc Af Amer 02/25/2016 >60  >60 mL/min Final   Comment: (NOTE) The eGFR has been calculated using the CKD EPI equation. This calculation has not been validated in all clinical situations. eGFR's persistently <60 mL/min signify possible Chronic Kidney Disease.   . Anion gap 02/25/2016 9  5 - 15 Final  Admission on 02/13/2016, Discharged on 02/20/2016  Component Date Value Ref Range Status  . Glucose-Capillary 02/13/2016 140* 65 - 99 mg/dL Final  .  Glucose-Capillary 02/13/2016 133* 65 - 99 mg/dL Final  . Comment 1 02/13/2016 Notify RN   Final  . Comment 2 02/13/2016 Document in Chart   Final  . MRSA by PCR 02/13/2016 NEGATIVE  NEGATIVE Final   Comment:        The GeneXpert MRSA Assay (FDA approved for NASAL specimens only), is one component of a comprehensive MRSA colonization surveillance program. It is not intended to diagnose MRSA infection nor to guide or monitor  treatment for MRSA infections.   . Color, Urine 02/16/2016 YELLOW  YELLOW Final  . APPearance 02/16/2016 HAZY* CLEAR Final  . Specific Gravity, Urine 02/16/2016 1.015  1.005 - 1.030 Final  . pH 02/16/2016 5.5  5.0 - 8.0 Final  . Glucose, UA 02/16/2016 NEGATIVE  NEGATIVE mg/dL Final  . Hgb urine dipstick 02/16/2016 NEGATIVE  NEGATIVE Final  . Bilirubin Urine 02/16/2016 NEGATIVE  NEGATIVE Final  . Ketones, ur 02/16/2016 NEGATIVE  NEGATIVE mg/dL Final  . Protein, ur 02/16/2016 NEGATIVE  NEGATIVE mg/dL Final  . Nitrite 02/16/2016 NEGATIVE  NEGATIVE Final  . Leukocytes, UA 02/16/2016 NEGATIVE  NEGATIVE Final  . Amylase 02/16/2016 40  28 - 100 U/L Final  . Lipase 02/16/2016 19  11 - 51 U/L Final  . Sodium 02/16/2016 140  135 - 145 mmol/L Final  . Potassium 02/16/2016 3.8  3.5 - 5.1 mmol/L Final  . Chloride 02/16/2016 105  101 - 111 mmol/L Final  . CO2 02/16/2016 27  22 - 32 mmol/L Final  . Glucose, Bld 02/16/2016 136* 65 - 99 mg/dL Final  . BUN 02/16/2016 8  6 - 20 mg/dL Final  . Creatinine, Ser 02/16/2016 1.03* 0.44 - 1.00 mg/dL Final  . Calcium 02/16/2016 9.0  8.9 - 10.3 mg/dL Final  . Total Protein 02/16/2016 6.6  6.5 - 8.1 g/dL Final  . Albumin 02/16/2016 3.0* 3.5 - 5.0 g/dL Final  . AST 02/16/2016 27  15 - 41 U/L Final  . ALT 02/16/2016 31  14 - 54 U/L Final  . Alkaline Phosphatase 02/16/2016 83  38 - 126 U/L Final  . Total Bilirubin 02/16/2016 0.7  0.3 - 1.2 mg/dL Final  . GFR calc non Af Amer 02/16/2016 >60  >60 mL/min Final  . GFR calc Af Amer  02/16/2016 >60  >60 mL/min Final   Comment: (NOTE) The eGFR has been calculated using the CKD EPI equation. This calculation has not been validated in all clinical situations. eGFR's persistently <60 mL/min signify possible Chronic Kidney Disease.   . Anion gap 02/16/2016 8  5 - 15 Final  . WBC 02/16/2016 14.5* 4.0 - 10.5 K/uL Final  . RBC 02/16/2016 3.85* 3.87 - 5.11 MIL/uL Final  . Hemoglobin 02/16/2016 10.8* 12.0 - 15.0 g/dL Final  . HCT 02/16/2016 34.5* 36.0 - 46.0 % Final  . MCV 02/16/2016 89.6  78.0 - 100.0 fL Final  . MCH 02/16/2016 28.1  26.0 - 34.0 pg Final  . MCHC 02/16/2016 31.3  30.0 - 36.0 g/dL Final  . RDW 02/16/2016 14.9  11.5 - 15.5 % Final  . Platelets 02/16/2016 309  150 - 400 K/uL Final  . Glucose-Capillary 02/17/2016 132* 65 - 99 mg/dL Final  . Comment 1 02/17/2016 Notify RN   Final  . Glucose-Capillary 02/18/2016 133* 65 - 99 mg/dL Final  . Comment 1 02/18/2016 Notify RN   Final  . Glucose-Capillary 02/18/2016 120* 65 - 99 mg/dL Final  . Glucose-Capillary 02/18/2016 131* 65 - 99 mg/dL Final  . Comment 1 02/18/2016 Notify RN   Final  . WBC 02/19/2016 9.9  4.0 - 10.5 K/uL Final  . RBC 02/19/2016 3.54* 3.87 - 5.11 MIL/uL Final  . Hemoglobin 02/19/2016 9.7* 12.0 - 15.0 g/dL Final  . HCT 02/19/2016 31.7* 36.0 - 46.0 % Final  . MCV 02/19/2016 89.5  78.0 - 100.0 fL Final  . MCH 02/19/2016 27.4  26.0 - 34.0 pg Final  . MCHC 02/19/2016 30.6  30.0 - 36.0 g/dL Final  . RDW  02/19/2016 14.3  11.5 - 15.5 % Final  . Platelets 02/19/2016 331  150 - 400 K/uL Final  . Glucose-Capillary 02/18/2016 129* 65 - 99 mg/dL Final  . Glucose-Capillary 02/18/2016 97  65 - 99 mg/dL Final  . Glucose-Capillary 02/19/2016 142* 65 - 99 mg/dL Final  . Glucose-Capillary 02/19/2016 112* 65 - 99 mg/dL Final  . Comment 1 02/19/2016 Notify RN   Final  . Glucose-Capillary 02/19/2016 102* 65 - 99 mg/dL Final  . Comment 1 02/19/2016 Notify RN   Final  . Glucose-Capillary 02/19/2016 104* 65 - 99  mg/dL Final  . Glucose-Capillary 02/20/2016 131* 65 - 99 mg/dL Final  . Glucose-Capillary 02/20/2016 128* 65 - 99 mg/dL Final    Dg Chest 2 View  Result Date: 05/11/2016 CLINICAL DATA:  Acute onset of right-sided chest pain and shortness of breath. Respiratory difficulty. Productive cough and nausea. Initial encounter. EXAM: CHEST  2 VIEW COMPARISON:  Chest radiograph performed 04/12/2016 FINDINGS: The lungs are well-aerated. Pulmonary vascularity is at the upper limits of normal. Minimal bibasilar atelectasis is seen. There is no evidence of focal opacification, pleural effusion or pneumothorax. The heart is mildly enlarged. No acute osseous abnormalities are seen. A tracheostomy tube is noted ending 7 cm above the carina. IMPRESSION: Minimal bibasilar atelectasis noted. Lungs otherwise grossly clear. Mild cardiomegaly. Electronically Signed   By: Garald Balding M.D.   On: 05/11/2016 22:30     Assessment/Plan   ICD-9-CM ICD-10-CM   1. Edema, unspecified type 782.3 R60.9   2. Insomnia, unspecified type 780.52 G47.00   3. Anxiety with depression 300.4 F41.8   4. Polyneuropathy (HCC) 356.9 G62.9   5. Fibromyalgia 729.1 M79.7   6. Tracheostomy status ([place]) V44.0 Z93.0   7. Essential hypertension 401.9 I10   8. Type 2 diabetes mellitus with diabetic polyneuropathy, unspecified long term insulin use status (HCC) 250.60 E11.42    357.2    9. Morbid obesity due to excess calories ([place]) 278.01 E66.01     D/c lyrica due to swelling. Taper off to 12m every other day x 3 doses -->stop.  Rx gabapentin [id number] for FMS/neuropathy  Cont other meds as ordered   RUE venous doppler USKoreardered last week  Trach care as indicated  PT/OT/ST as ordered  F/u with specialists as scheduled  Will follow  Lawrnce Reyez S. CaPerlie GoldPiY[facility] District Hospitalnd [facility] [id number] [place] [phone] (Monday-Friday 8 AM - 5 PM) ([phone] 5  PM and follow prompts

## 2016-05-20 DIAGNOSIS — Z9889 Other specified postprocedural states: Secondary | ICD-10-CM

## 2016-05-20 HISTORY — DX: Other specified postprocedural states: Z98.890

## 2016-05-21 DIAGNOSIS — F411 Generalized anxiety disorder: Secondary | ICD-10-CM | POA: Insufficient documentation

## 2016-05-21 DIAGNOSIS — K589 Irritable bowel syndrome without diarrhea: Secondary | ICD-10-CM | POA: Insufficient documentation

## 2016-05-21 HISTORY — DX: Irritable bowel syndrome, unspecified: K58.9

## 2016-05-23 DIAGNOSIS — Z930 Tracheostomy status: Secondary | ICD-10-CM

## 2016-05-23 HISTORY — DX: Tracheostomy status: Z93.0

## 2016-05-26 DIAGNOSIS — D509 Iron deficiency anemia, unspecified: Secondary | ICD-10-CM

## 2016-05-26 HISTORY — DX: Iron deficiency anemia, unspecified: D50.9

## 2016-05-28 ENCOUNTER — Other Ambulatory Visit: Payer: Self-pay

## 2016-05-28 MED ORDER — PREGABALIN 50 MG PO CAPS: 50.0000 mg | ORAL_CAPSULE | Freq: Every day | ORAL | 5 refills | Status: DC

## 2016-05-28 NOTE — Telephone Encounter (Signed)
RX faxed to [facility] @ [phone], phone number [phone] 

## 2016-06-09 ENCOUNTER — Non-Acute Institutional Stay (SKILLED_NURSING_FACILITY): Payer: Medicaid Other | Admitting: Adult Health

## 2016-06-09 ENCOUNTER — Encounter: Payer: Self-pay | Admitting: Adult Health

## 2016-06-09 DIAGNOSIS — M25561 Pain in right knee: Secondary | ICD-10-CM | POA: Diagnosis not present

## 2016-06-09 NOTE — Progress Notes (Signed)
Patient ID: Brantley StageSonja Corum, female   DOB: 10-14-1960, 55 y.o.   MRN: [id number]   Location:   [facility] Room Number: 116-A Place of Service:  SNF (31)   CODE STATUS: Full Code  Allergies  Allergen Reactions  . Buprenorphine Hcl Hives  . Morphine And Related Hives and Dermatitis    Chief Complaint  Patient presents with  . Acute Visit    Knee pain    HPI:  She is having right knee pain. She tells me that her right knee is causing her pain and is interferring with her ability to participate with therapy. She would like to have an orthopedic appointment to evaluate her knee.   Past Medical History:  Diagnosis Date  . Anemia   . Anemia   . Chronic pain   . CKD (chronic kidney disease)   . Diabetes mellitus without complication (HCC)    diet controlled. does not check CBG's  . Fibromyalgia    hospitilized 12/16 due to inability to walk  . Hyperlipemia   . Hypertension   . Insomnia   . Neuropathy (HCC)   . Right sided weakness   . Stroke [facility](HCC) 2014?   no residual weakness   . Vitamin deficiency    Vit D    Past Surgical History:  Procedure Laterality Date  . KNEE SURGERY  1992, 1995, 1997  . LEG SURGERY Right 2007   Surgery x3 2 arthroscopies and one rod  . OOPHORECTOMY Right 1993?  . TRACHEOSTOMY TUBE PLACEMENT N/A 02/13/2016   Procedure: TRACHEOSTOMY;  Surgeon: Christia Readingwight Bates, MD;  [facility];  Service: ENT;  Laterality: N/A;    Social History   Social History  . Marital status: Single    Spouse name: N/A  . Number of children: 2  . Years of education: 12th    Occupational History  . SSI    Social History Main Topics  . Smoking status: Never Smoker  . Smokeless tobacco: Never Used  . Alcohol use No  . Drug use: No  . Sexual activity: No   Other Topics Concern  . Not on file   Social History Narrative   Reports no caffeine use    Family History  Problem Relation Age of Onset  . Breast cancer Mother   . Leukemia Brother   .  Breast cancer Maternal Grandmother       VITAL SIGNS BP (!) 106/51   Pulse (!) 59   Temp 99.1 F (37.3 C) (Oral)   Resp 16   Ht 5\' 7"  (1.702 m)   Wt (!) 330 lb (149.7 kg)   SpO2 98%   BMI 51.69 kg/m   Patient's Medications  New Prescriptions   No medications on file  Previous Medications   ALBUTEROL (PROVENTIL HFA;VENTOLIN HFA) 108 (90 BASE) MCG/ACT INHALER    Inhale 2 puffs into the lungs every 6 (six) hours as needed for wheezing.   AMLODIPINE (NORVASC) 10 MG TABLET    Take 10 mg by mouth daily.   ATENOLOL (TENORMIN) 50 MG TABLET    Take 50 mg by mouth daily.   BUSPIRONE (BUSPAR) 15 MG TABLET    Take 15 mg by mouth 2 (two) times daily.   CHOLESTYRAMINE (QUESTRAN) 4 G PACKET    Take 4 g by mouth daily.   CLONAZEPAM (KLONOPIN) 0.5 MG TABLET    Take one tablet by mouth once daily at bedtime   ESCITALOPRAM (LEXAPRO) 10 MG TABLET    Take 10  mg by mouth daily.    FERROUS SULFATE 325 (65 FE) MG TABLET    Take 325 mg by mouth daily.   GABAPENTIN (NEURONTIN) 300 MG CAPSULE    Take 300 mg by mouth at bedtime.   GLIPIZIDE (GLUCOTROL XL) 2.5 MG 24 HR TABLET    Take 1 tablet (2.5 mg total) by mouth daily with breakfast.   HYDROCHLOROTHIAZIDE (HYDRODIURIL) 25 MG TABLET    Take 0.5 tablets (12.5 mg total) by mouth daily.   HYOSCYAMINE (ANASPAZ) 0.125 MG TBDP DISINTERGRATING TABLET    Place 0.125 mg under the tongue 3 (three) times daily as needed. For Gas-esophageal  Reflux   LOPERAMIDE (IMODIUM) 2 MG CAPSULE    Take 2 mg by mouth 3 (three) times daily before meals.   NYSTATIN (NYSTATIN) POWDER    Apply topically daily.   ONDANSETRON (ZOFRAN) 4 MG TABLET    Take 4 mg by mouth daily. May also give one tablet every 8 hours prn   SACCHAROMYCES BOULARDII (FLORASTOR) 250 MG CAPSULE    Take 250 mg by mouth 2 (two) times daily.   TRAMADOL (ULTRAM) 50 MG TABLET    Take 50 mg by mouth every 6 (six) hours as needed.   TRAZODONE (DESYREL) 150 MG TABLET    Take 150 mg by mouth at bedtime.  Modified  Medications   No medications on file  Discontinued Medications   PREGABALIN (LYRICA) 50 MG CAPSULE    Take 1 capsule (50 mg total) by mouth daily.     SIGNIFICANT DIAGNOSTIC EXAMS  07-06-15: bilateral hip and pelvis x-ray; Negative exam.  07-06-15: ct scan of  left hip: No evidence of fracture or dislocation. No significant joint effusion. No definite findings seen to suggest septic arthritis at this time  07-17-15: left elbow x-ray: 1. No evidence of acute fracture or dislocation. 2. Several small osseous fragments overlying the medial epicondyle, likely reflecting remote injury.  03-06-16: chest x-ray: Low volumes with some increase in bibasilar atelectasis or infiltrate.  03-07-16: chest x-ray: No acute cardiopulmonary findings.  04-12-16: chest x-ray: Tracheostomy. Shallow inspiration. Mild cardiac enlargement without vascular congestion. No focal airspace disease or consolidation. No blunting of costophrenic angles. No pneumothorax. Tortuous aorta.  04-13-16: pelvic x-ray: There is no evidence of pelvic fracture or diastasis. No pelvic bone lesions are seen.  04-13-16: mri brain: 1. No acute intracranial abnormality. 2. Moderate cerebral white matter disease, unchanged and nonspecific though may reflect age advanced chronic small vessel ischemia.  04-13-16: mri of lumbar spine: 1. At L4-5 there is a mild broad-based disc bulge. Moderate left and mild right facet arthropathy. 2. At L5-S1 there is a broad-based disc bulge. Moderate bilateral facet arthropathy.  05-11-16: chest x-ray: Minimal bibasilar atelectasis noted. Lungs otherwise grossly clear. Mild cardiomegaly.    LABS REVIEWED:   07-07-15: wbc 9.2; hgb 9.8; hct 30.7; mcv 89.5; plt 260 glucose 123; bun 17; creat 1.29; k+ 3.3; na++142; vitamin B12: 552; folate 9.2; iron 71; TIBC 253 07-09-15: wbc 7.7; hgb 9.7; hct 30.6; mcv 88.7; ;plt 290; glucose 118; bun 14; creat 0.99; k+ 3.6; na++142 07-17-15: wbc 10.0; hgb 11.3; hct 33.9;  mcv 88.1; plt 318; glucose 112; bun 16; creat 1.13; k+ 3.7; na++142; sed rate 60; CRP 3.1  02-05-16: hgb a1c 6.1 03-06-16: wbc 7.1;hb 11.8; hct 36.8;mcv 88.0; plt 396; glucose 110; bun 16; creat 1.26; k+ 3.1; na++ 140; urine culture: 40,000 enterococcus 03-11-16: wbc 7.4; hgb 9.4; hct 30.2; mcv 88.3; plt 294; glucose 106; bun 13;  creat 1.11; k+ 3.9; na++ 141; liver normal albumin 2.9  04-12-16: wbc 7.9; hgb 10.8; hct 35.5; mcv 90.8; plt 319; glucose 105; bun 11; creat 1.18; k+ 3.7; na++ 143; liver normal albumin 3.2; BNP 69; blood culture: no growth 04-13-16: tsh 0.720; urine culture: multiple species 04-14-16: wbc 7.2; hgb 10.4; hct 33.8; mcv 90.6; plt 283; glucose 86; bun 12; creat 0.96; k+ 3.8; na++ 142  05-12-16: wbc 8.3; hgb 11.4; hct 35.9; mcv 88.2; plt 309; glucose 90; bun 17; creat 1.23; k+ 3.6; na++ 143; liver normal albumin 3.5; urine culture: no growth    Review of Systems  Constitutional: Negative for malaise/fatigue.  Respiratory: Positive for cough. Negative for shortness of breath.        Has trach  Has occasional cough   Cardiovascular: Negative for chest pain, palpitations and leg swelling.  Gastrointestinal: Negative for abdominal pain, constipation, heartburn and nausea.  Musculoskeletal: Negative for back pain, has right knee pain  Skin: Negative.   Neurological: Negative for dizziness.  Psychiatric/Behavioral: The patient is not nervous/anxious.       Physical Exam  Constitutional: She is oriented to person, place, and time. She appears well-developed and well-nourished. No distress.  Morbidly obese   Eyes: Conjunctivae are normal.  Neck: Neck supple. No JVD present. No thyromegaly present.  Has trach  Cardiovascular: Normal rate, regular rhythm and intact distal pulses.   Respiratory: Effort normal and breath sounds normal. No respiratory distress. She has no wheezes.  GI: Soft. Bowel sounds are normal. She exhibits no distension. There is no tenderness.    Musculoskeletal:  Able to move all extremities Does have right knee weakness     Lymphadenopathy:    She has no cervical adenopathy.  Neurological: She is alert and oriented to person, place, and time.  Skin: Skin is warm and dry. She is not diaphoretic.  Psychiatric: She has a normal mood and affect.     ASSESSMENT/PLAN  1.  Right knee pain: will get an x-ray of her knee. Will setup an orthopedic appointment for and will monitor her status.     MD is aware of resident's narcotic use and is in agreement with current plan of care. We will attempt to wean resident as apropriate   Synthia Innocent NP [facility]  Contact [phone] Monday through Friday 8am- 5pm  After hours call [phone]

## 2016-06-16 ENCOUNTER — Encounter: Payer: Self-pay | Admitting: Adult Health

## 2016-06-16 ENCOUNTER — Non-Acute Institutional Stay (SKILLED_NURSING_FACILITY): Payer: Medicaid Other | Admitting: Adult Health

## 2016-06-16 DIAGNOSIS — M542 Cervicalgia: Secondary | ICD-10-CM | POA: Diagnosis not present

## 2016-06-16 NOTE — Progress Notes (Signed)
Patient ID: Yesenia [name], female   DOB: 04-13-1961, 55 y.o.   MRN: [id number]    Location:   [facility] Room Number: 116-A Place of Service:  SNF (31)   CODE STATUS: Full Code  Allergies  Allergen Reactions  . Buprenorphine Hcl Hives  . Morphine And Related Hives and Dermatitis    Chief Complaint  Patient presents with  . Acute Visit    HPI:  She is having pain in her neck around her trach area and thyroid area. She does not report any fevers present. She is able to eat and drink without difficulty.     Past Medical History:  Diagnosis Date  . Anemia   . Anemia   . Chronic pain   . CKD (chronic kidney disease)   . Diabetes mellitus without complication (HCC)    diet controlled. does not check CBG's  . Fibromyalgia    hospitilized 12/16 due to inability to walk  . Hyperlipemia   . Hypertension   . Insomnia   . Neuropathy (HCC)   . Right sided weakness   . Stroke [facility](HCC) 2014?   no residual weakness   . Vitamin deficiency    Vit D    Past Surgical History:  Procedure Laterality Date  . KNEE SURGERY  1992, 1995, 1997  . LEG SURGERY Right 2007   Surgery x3 2 arthroscopies and one rod  . OOPHORECTOMY Right 1993?  . TRACHEOSTOMY TUBE PLACEMENT N/A 02/13/2016   Procedure: TRACHEOSTOMY;  Surgeon: Christia Readingwight Bates, MD;  [facility];  Service: ENT;  Laterality: N/A;    Social History   Social History  . Marital status: Single    Spouse name: N/A  . Number of children: 2  . Years of education: 12th    Occupational History  . SSI    Social History Main Topics  . Smoking status: Never Smoker  . Smokeless tobacco: Never Used  . Alcohol use No  . Drug use: No  . Sexual activity: No   Other Topics Concern  . Not on file   Social History Narrative   Reports no caffeine use    Family History  Problem Relation Age of Onset  . Breast cancer Mother   . Leukemia Brother   . Breast cancer Maternal Grandmother       VITAL SIGNS BP (!) 111/58    Pulse 65   Temp 97.4 F (36.3 C) (Oral)   Resp 18   Ht 5\' 7"  (1.702 m)   Wt (!) 330 lb (149.7 kg)   SpO2 97%   BMI 51.69 kg/m   Patient's Medications  New Prescriptions   No medications on file  Previous Medications   ALBUTEROL (PROVENTIL HFA;VENTOLIN HFA) 108 (90 BASE) MCG/ACT INHALER    Inhale 2 puffs into the lungs every 6 (six) hours as needed for wheezing.   AMLODIPINE (NORVASC) 10 MG TABLET    Take 10 mg by mouth daily.   ATENOLOL (TENORMIN) 50 MG TABLET    Take 50 mg by mouth daily.   BUSPIRONE (BUSPAR) 15 MG TABLET    Take 15 mg by mouth 2 (two) times daily.   CHOLESTYRAMINE (QUESTRAN) 4 G PACKET    Take 4 g by mouth daily.   CLONAZEPAM (KLONOPIN) 0.5 MG TABLET    Take one tablet by mouth once daily at bedtime   ESCITALOPRAM (LEXAPRO) 10 MG TABLET    Take 10 mg by mouth daily.    FERROUS SULFATE 325 (65 FE)  MG TABLET    Take 325 mg by mouth daily.   GABAPENTIN (NEURONTIN) 300 MG CAPSULE    Take 300 mg by mouth at bedtime.   GLIPIZIDE (GLUCOTROL XL) 2.5 MG 24 HR TABLET    Take 1 tablet (2.5 mg total) by mouth daily with breakfast.   HYDROCHLOROTHIAZIDE (HYDRODIURIL) 25 MG TABLET    Take 0.5 tablets (12.5 mg total) by mouth daily.   HYOSCYAMINE (ANASPAZ) 0.125 MG TBDP DISINTERGRATING TABLET    Place 0.125 mg under the tongue 3 (three) times daily as needed. For Gas-esophageal  Reflux   LOPERAMIDE (IMODIUM) 2 MG CAPSULE    Take 2 mg by mouth 3 (three) times daily before meals.   NYSTATIN (NYSTATIN) POWDER    Apply topically daily.   ONDANSETRON (ZOFRAN) 4 MG TABLET    Take 4 mg by mouth daily. May also give one tablet every 8 hours prn   SACCHAROMYCES BOULARDII (FLORASTOR) 250 MG CAPSULE    Take 250 mg by mouth 2 (two) times daily.   TRAMADOL (ULTRAM) 50 MG TABLET    Take 50 mg by mouth every 6 (six) hours as needed.   TRAZODONE (DESYREL) 150 MG TABLET    Take 150 mg by mouth at bedtime.  Modified Medications   No medications on file  Discontinued Medications   No  medications on file     SIGNIFICANT DIAGNOSTIC EXAMS  07-06-15: bilateral hip and pelvis x-ray; Negative exam.  07-06-15: ct scan of  left hip: No evidence of fracture or dislocation. No significant joint effusion. No definite findings seen to suggest septic arthritis at this time  07-17-15: left elbow x-ray: 1. No evidence of acute fracture or dislocation. 2. Several small osseous fragments overlying the medial epicondyle, likely reflecting remote injury.  03-06-16: chest x-ray: Low volumes with some increase in bibasilar atelectasis or infiltrate.  03-07-16: chest x-ray: No acute cardiopulmonary findings.  04-12-16: chest x-ray: Tracheostomy. Shallow inspiration. Mild cardiac enlargement without vascular congestion. No focal airspace disease or consolidation. No blunting of costophrenic angles. No pneumothorax. Tortuous aorta.  04-13-16: pelvic x-ray: There is no evidence of pelvic fracture or diastasis. No pelvic bone lesions are seen.  04-13-16: mri brain: 1. No acute intracranial abnormality. 2. Moderate cerebral white matter disease, unchanged and nonspecific though may reflect age advanced chronic small vessel ischemia.  04-13-16: mri of lumbar spine: 1. At L4-5 there is a mild broad-based disc bulge. Moderate left and mild right facet arthropathy. 2. At L5-S1 there is a broad-based disc bulge. Moderate bilateral facet arthropathy.  05-11-16: chest x-ray: Minimal bibasilar atelectasis noted. Lungs otherwise grossly clear. Mild cardiomegaly.    LABS REVIEWED:   07-07-15: wbc 9.2; hgb 9.8; hct 30.7; mcv 89.5; plt 260 glucose 123; bun 17; creat 1.29; k+ 3.3; na++142; vitamin B12: 552; folate 9.2; iron 71; TIBC 253 07-09-15: wbc 7.7; hgb 9.7; hct 30.6; mcv 88.7; ;plt 290; glucose 118; bun 14; creat 0.99; k+ 3.6; na++142 07-17-15: wbc 10.0; hgb 11.3; hct 33.9; mcv 88.1; plt 318; glucose 112; bun 16; creat 1.13; k+ 3.7; na++142; sed rate 60; CRP 3.1  02-05-16: hgb a1c 6.1 03-06-16: wbc  7.1;hb 11.8; hct 36.8;mcv 88.0; plt 396; glucose 110; bun 16; creat 1.26; k+ 3.1; na++ 140; urine culture: 40,000 enterococcus 03-11-16: wbc 7.4; hgb 9.4; hct 30.2; mcv 88.3; plt 294; glucose 106; bun 13; creat 1.11; k+ 3.9; na++ 141; liver normal albumin 2.9  04-12-16: wbc 7.9; hgb 10.8; hct 35.5; mcv 90.8; plt 319; glucose 105; bun  11; creat 1.18; k+ 3.7; na++ 143; liver normal albumin 3.2; BNP 69; blood culture: no growth 04-13-16: tsh 0.720; urine culture: multiple species 04-14-16: wbc 7.2; hgb 10.4; hct 33.8; mcv 90.6; plt 283; glucose 86; bun 12; creat 0.96; k+ 3.8; na++ 142  05-12-16: wbc 8.3; hgb 11.4; hct 35.9; mcv 88.2; plt 309; glucose 90; bun 17; creat 1.23; k+ 3.6; na++ 143; liver normal albumin 3.5; urine culture: no growth    Review of Systems  Constitutional: Negative for malaise/fatigue.  Respiratory: Positive for cough. Negative for shortness of breath.        Has trach  Has occasional cough  has neck pain  Cardiovascular: Negative for chest pain, palpitations and leg swelling.  Gastrointestinal: Negative for abdominal pain, constipation, heartburn and nausea.  Musculoskeletal: Negative for back pain, joint pain and myalgias.  Skin: Negative.   Neurological: Negative for dizziness.  Psychiatric/Behavioral: The patient is not nervous/anxious.       Physical Exam  Constitutional: She is oriented to person, place, and time. She appears well-developed and well-nourished. No distress.  Morbidly obese   Eyes: Conjunctivae are normal.  Neck: Neck supple. No JVD present. No thyromegaly present.  Has trach there is tenderness present with some slight swelling present possible nodule?.   Cardiovascular: Normal rate, regular rhythm and intact distal pulses.   Respiratory: Effort normal and breath sounds normal. No respiratory distress. She has no wheezes.  GI: Soft. Bowel sounds are normal. She exhibits no distension. There is no tenderness.  Musculoskeletal:  Able to move all  extremities    Lymphadenopathy:    She has no cervical adenopathy.  Neurological: She is alert and oriented to person, place, and time.  Skin: Skin is warm and dry. She is not diaphoretic.  Psychiatric: She has a normal mood and affect.     ASSESSMENT/PLAN  1 neck pain;  Will setup a thyroid ultrasound; and will check tsh will monitor       MD is aware of resident's narcotic use and is in agreement with current plan of care. We will attempt to wean resident as apropriate   Synthia Innocent NP [facility]  Contact [phone] Monday through Friday 8am- 5pm  After hours call [phone]

## 2016-06-18 ENCOUNTER — Other Ambulatory Visit: Payer: Self-pay | Admitting: Internal Medicine

## 2016-06-18 ENCOUNTER — Encounter: Payer: Self-pay | Admitting: Adult Health

## 2016-06-18 ENCOUNTER — Non-Acute Institutional Stay (SKILLED_NURSING_FACILITY): Payer: Medicaid Other | Admitting: Adult Health

## 2016-06-18 DIAGNOSIS — J9500 Unspecified tracheostomy complication: Secondary | ICD-10-CM

## 2016-06-18 DIAGNOSIS — D509 Iron deficiency anemia, unspecified: Secondary | ICD-10-CM | POA: Diagnosis not present

## 2016-06-18 DIAGNOSIS — I10 Essential (primary) hypertension: Secondary | ICD-10-CM

## 2016-06-18 DIAGNOSIS — N183 Chronic kidney disease, stage 3 unspecified: Secondary | ICD-10-CM

## 2016-06-18 DIAGNOSIS — G629 Polyneuropathy, unspecified: Secondary | ICD-10-CM | POA: Diagnosis not present

## 2016-06-18 DIAGNOSIS — G894 Chronic pain syndrome: Secondary | ICD-10-CM

## 2016-06-18 DIAGNOSIS — K580 Irritable bowel syndrome with diarrhea: Secondary | ICD-10-CM | POA: Diagnosis not present

## 2016-06-18 DIAGNOSIS — I639 Cerebral infarction, unspecified: Secondary | ICD-10-CM | POA: Diagnosis not present

## 2016-06-18 DIAGNOSIS — G4730 Sleep apnea, unspecified: Secondary | ICD-10-CM | POA: Diagnosis not present

## 2016-06-18 DIAGNOSIS — M797 Fibromyalgia: Secondary | ICD-10-CM

## 2016-06-18 DIAGNOSIS — E1122 Type 2 diabetes mellitus with diabetic chronic kidney disease: Secondary | ICD-10-CM

## 2016-06-18 DIAGNOSIS — M799 Soft tissue disorder, unspecified: Secondary | ICD-10-CM

## 2016-06-18 NOTE — Progress Notes (Signed)
Patient ID: Yesenia [name], female   DOB: 05-Jun-1961, 55 y.o.   MRN: [id number]   Location:    [facility] Room Number: 203-A Place of Service:  SNF (31)   CODE STATUS: Full Code  Allergies  Allergen Reactions  . Buprenorphine Hcl Hives  . Morphine And Related Hives and Dermatitis    Chief Complaint  Patient presents with  . Medical Management of Chronic Issues    Follow up    HPI:  She is a resident of this facility being seen for the management of her chronic illnesses. Overall her status is stable. The ultrasound of her neck is pending. She does have pain in her neck around her trach area. She tells me that she is feeling better overall. There are no nursing concerns at this time  Past Medical History:  Diagnosis Date  . Anemia   . Anemia   . Chronic pain   . CKD (chronic kidney disease)   . Diabetes mellitus without complication (HCC)    diet controlled. does not check CBG's  . Fibromyalgia    hospitilized 12/16 due to inability to walk  . Hyperlipemia   . Hypertension   . Insomnia   . Neuropathy (HCC)   . Right sided weakness   . Stroke [facility](HCC) 2014?   no residual weakness   . Vitamin deficiency    Vit D    Past Surgical History:  Procedure Laterality Date  . KNEE SURGERY  1992, 1995, 1997  . LEG SURGERY Right 2007   Surgery x3 2 arthroscopies and one rod  . OOPHORECTOMY Right 1993?  . TRACHEOSTOMY TUBE PLACEMENT N/A 02/13/2016   Procedure: TRACHEOSTOMY;  Surgeon: Christia Readingwight Bates, MD;  [facility];  Service: ENT;  Laterality: N/A;    Social History   Social History  . Marital status: Single    Spouse name: N/A  . Number of children: 2  . Years of education: 12th    Occupational History  . [employer]    Social History Main Topics  . Smoking status: Never Smoker  . Smokeless tobacco: Never Used  . Alcohol use No  . Drug use: No  . Sexual activity: No   Other Topics Concern  . Not on file   Social History Narrative   Reports no caffeine  use    Family History  Problem Relation Age of Onset  . Breast cancer Mother   . Leukemia Brother   . Breast cancer Maternal Grandmother       VITAL SIGNS BP (!) 111/58   Pulse 65   Temp 97.4 F (36.3 C) (Oral)   Resp 18   Ht 5\' 7"  (1.702 m)   Wt (!) 330 lb (149.7 kg)   SpO2 94%   BMI 51.69 kg/m   Patient's Medications  New Prescriptions   No medications on file  Previous Medications   ALBUTEROL (PROVENTIL HFA;VENTOLIN HFA) 108 (90 BASE) MCG/ACT INHALER    Inhale 2 puffs into the lungs every 6 (six) hours as needed for wheezing.   AMLODIPINE (NORVASC) 10 MG TABLET    Take 10 mg by mouth daily.   ATENOLOL (TENORMIN) 50 MG TABLET    Take 50 mg by mouth daily.   BUSPIRONE (BUSPAR) 15 MG TABLET    Take 15 mg by mouth 2 (two) times daily.   CHOLESTYRAMINE (QUESTRAN) 4 G PACKET    Take 4 g by mouth daily.   CLONAZEPAM (KLONOPIN) 0.5 MG TABLET    Take one  tablet by mouth once daily at bedtime   ESCITALOPRAM (LEXAPRO) 10 MG TABLET    Take 10 mg by mouth daily.    FERROUS SULFATE 325 (65 FE) MG TABLET    Take 325 mg by mouth daily.   GABAPENTIN (NEURONTIN) 300 MG CAPSULE    Take 300 mg by mouth at bedtime.   GLIPIZIDE (GLUCOTROL XL) 2.5 MG 24 HR TABLET    Take 1 tablet (2.5 mg total) by mouth daily with breakfast.   HYDROCHLOROTHIAZIDE (HYDRODIURIL) 25 MG TABLET    Take 0.5 tablets (12.5 mg total) by mouth daily.   HYOSCYAMINE (ANASPAZ) 0.125 MG TBDP DISINTERGRATING TABLET    Place 0.125 mg under the tongue 3 (three) times daily as needed. For Gas-esophageal  Reflux   LOPERAMIDE (IMODIUM) 2 MG CAPSULE    Take 2 mg by mouth 3 (three) times daily before meals.   NYSTATIN (NYSTATIN) POWDER    Apply topically daily.   ONDANSETRON (ZOFRAN) 4 MG TABLET    Take 4 mg by mouth daily. May also give one tablet every 8 hours prn   SACCHAROMYCES BOULARDII (FLORASTOR) 250 MG CAPSULE    Take 250 mg by mouth 2 (two) times daily.   TRAMADOL (ULTRAM) 50 MG TABLET    Take 50 mg by mouth every 6 (six)  hours as needed.   TRAZODONE (DESYREL) 150 MG TABLET    Take 150 mg by mouth at bedtime.  Modified Medications   No medications on file  Discontinued Medications   No medications on file     SIGNIFICANT DIAGNOSTIC EXAMS  07-06-15: bilateral hip and pelvis x-ray; Negative exam.  07-06-15: ct scan of  left hip: No evidence of fracture or dislocation. No significant joint effusion. No definite findings seen to suggest septic arthritis at this time  07-17-15: left elbow x-ray: 1. No evidence of acute fracture or dislocation. 2. Several small osseous fragments overlying the medial epicondyle, likely reflecting remote injury.  03-06-16: chest x-ray: Low volumes with some increase in bibasilar atelectasis or infiltrate.  03-07-16: chest x-ray: No acute cardiopulmonary findings.  04-12-16: chest x-ray: Tracheostomy. Shallow inspiration. Mild cardiac enlargement without vascular congestion. No focal airspace disease or consolidation. No blunting of costophrenic angles. No pneumothorax. Tortuous aorta.  04-13-16: pelvic x-ray: There is no evidence of pelvic fracture or diastasis. No pelvic bone lesions are seen.  04-13-16: mri brain: 1. No acute intracranial abnormality. 2. Moderate cerebral white matter disease, unchanged and nonspecific though may reflect age advanced chronic small vessel ischemia.  04-13-16: mri of lumbar spine: 1. At L4-5 there is a mild broad-based disc bulge. Moderate left and mild right facet arthropathy. 2. At L5-S1 there is a broad-based disc bulge. Moderate bilateral facet arthropathy.  05-11-16: chest x-ray: Minimal bibasilar atelectasis noted. Lungs otherwise grossly clear. Mild cardiomegaly.    LABS REVIEWED:   07-07-15: wbc 9.2; hgb 9.8; hct 30.7; mcv 89.5; plt 260 glucose 123; bun 17; creat 1.29; k+ 3.3; na++142; vitamin B12: 552; folate 9.2; iron 71; TIBC 253 07-09-15: wbc 7.7; hgb 9.7; hct 30.6; mcv 88.7; ;plt 290; glucose 118; bun 14; creat 0.99; k+ 3.6;  na++142 07-17-15: wbc 10.0; hgb 11.3; hct 33.9; mcv 88.1; plt 318; glucose 112; bun 16; creat 1.13; k+ 3.7; na++142; sed rate 60; CRP 3.1  02-05-16: hgb a1c 6.1 03-06-16: wbc 7.1;hb 11.8; hct 36.8;mcv 88.0; plt 396; glucose 110; bun 16; creat 1.26; k+ 3.1; na++ 140; urine culture: 40,000 enterococcus 03-11-16: wbc 7.4; hgb 9.4; hct 30.2; mcv 88.3;  plt 294; glucose 106; bun 13; creat 1.11; k+ 3.9; na++ 141; liver normal albumin 2.9  04-12-16: wbc 7.9; hgb 10.8; hct 35.5; mcv 90.8; plt 319; glucose 105; bun 11; creat 1.18; k+ 3.7; na++ 143; liver normal albumin 3.2; BNP 69; blood culture: no growth 04-13-16: tsh 0.720; urine culture: multiple species 04-14-16: wbc 7.2; hgb 10.4; hct 33.8; mcv 90.6; plt 283; glucose 86; bun 12; creat 0.96; k+ 3.8; na++ 142  05-12-16: wbc 8.3; hgb 11.4; hct 35.9; mcv 88.2; plt 309; glucose 90; bun 17; creat 1.23; k+ 3.6; na++ 143; liver normal albumin 3.5; urine culture: no growth    Review of Systems  Constitutional: Negative for malaise/fatigue.  Respiratory: Positive for cough. Negative for shortness of breath.        Has trach has pain  Has occasional cough   Cardiovascular: Negative for chest pain, palpitations and leg swelling.  Gastrointestinal: Negative for abdominal pain, constipation, heartburn and nausea.  Musculoskeletal: Negative for back pain, joint pain and myalgias.  Skin: Negative.   Neurological: Negative for dizziness.  Psychiatric/Behavioral: The patient is not nervous/anxious.       Physical Exam  Constitutional: She is oriented to person, place, and time. She appears well-developed and well-nourished. No distress.  Morbidly obese   Eyes: Conjunctivae are normal.  Neck: Neck supple. No JVD present. No thyromegaly present.  Has trach  Cardiovascular: Normal rate, regular rhythm and intact distal pulses.   Respiratory: Effort normal and breath sounds normal. No respiratory distress. She has no wheezes.  GI: Soft. Bowel sounds are normal. She  exhibits no distension. There is no tenderness.  Musculoskeletal:  Able to move all extremities    Lymphadenopathy:    She has no cervical adenopathy.  Neurological: She is alert and oriented to person, place, and time.  Skin: Skin is warm and dry. She is not diaphoretic.  Psychiatric: She has a normal mood and affect.     ASSESSMENT/PLAN   1. Hypertension: will continue norvasc 10 mg daily hctz 12.5 mg daily and atenolol 50 mg daily   2, sleep apnea: is trach dependent; will continue albuterol 2 puffs every 6 hours as needed  3.  IBS: will continue questran 4 gm daily and imodium 2 mg before meals  4. Diabetes: hgb a1c 6.1; will continue glucotrol xl 2.5 mg daily   5. Anemia: hgb is 11.4; will continue iron daily   6. CVA: is neurologically stable will monitor  7. 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 CKD stage III: bun 17; creat 1.23  9. Anxiety: will continue lexapro 10 mg daily  buspar 15 mg twice daily and klonopin 0.5 mg nightly and trazodone 150 mg nightly   10. Chronic pain: will increase ultram to 100 mg every 6 hours as needed      MD is aware of resident's narcotic use and is in agreement with current plan of care. We will attempt to wean resident as apropriate   Synthia Innocent NP [facility]  Contact [phone] Monday through Friday 8am- 5pm  After hours call [phone]

## 2016-06-22 ENCOUNTER — Inpatient Hospital Stay (HOSPITAL_COMMUNITY)
Admission: EM | Admit: 2016-06-22 | Discharge: 2016-06-24 | DRG: 206 | Disposition: A | Discharge status: Skilled Nursing Facility | Payer: Medicaid Other | Attending: Internal Medicine | Admitting: Internal Medicine

## 2016-06-22 ENCOUNTER — Emergency Department (HOSPITAL_COMMUNITY): Payer: Medicaid Other

## 2016-06-22 DIAGNOSIS — Z888 Allergy status to other drugs, medicaments and biological substances status: Secondary | ICD-10-CM

## 2016-06-22 DIAGNOSIS — E876 Hypokalemia: Secondary | ICD-10-CM

## 2016-06-22 DIAGNOSIS — K219 Gastro-esophageal reflux disease without esophagitis: Secondary | ICD-10-CM | POA: Diagnosis present

## 2016-06-22 DIAGNOSIS — J9500 Unspecified tracheostomy complication: Secondary | ICD-10-CM | POA: Diagnosis present

## 2016-06-22 DIAGNOSIS — I10 Essential (primary) hypertension: Secondary | ICD-10-CM | POA: Diagnosis present

## 2016-06-22 DIAGNOSIS — N183 Chronic kidney disease, stage 3 unspecified: Secondary | ICD-10-CM | POA: Diagnosis present

## 2016-06-22 DIAGNOSIS — M797 Fibromyalgia: Secondary | ICD-10-CM | POA: Diagnosis present

## 2016-06-22 DIAGNOSIS — G629 Polyneuropathy, unspecified: Secondary | ICD-10-CM | POA: Diagnosis present

## 2016-06-22 DIAGNOSIS — Z90722 Acquired absence of ovaries, bilateral: Secondary | ICD-10-CM

## 2016-06-22 DIAGNOSIS — Z885 Allergy status to narcotic agent status: Secondary | ICD-10-CM

## 2016-06-22 DIAGNOSIS — J9501 Hemorrhage from tracheostomy stoma: Secondary | ICD-10-CM

## 2016-06-22 DIAGNOSIS — G8929 Other chronic pain: Secondary | ICD-10-CM | POA: Diagnosis present

## 2016-06-22 DIAGNOSIS — D649 Anemia, unspecified: Secondary | ICD-10-CM | POA: Diagnosis present

## 2016-06-22 DIAGNOSIS — G4733 Obstructive sleep apnea (adult) (pediatric): Secondary | ICD-10-CM | POA: Diagnosis present

## 2016-06-22 DIAGNOSIS — N289 Disorder of kidney and ureter, unspecified: Secondary | ICD-10-CM

## 2016-06-22 DIAGNOSIS — E1122 Type 2 diabetes mellitus with diabetic chronic kidney disease: Secondary | ICD-10-CM | POA: Diagnosis present

## 2016-06-22 DIAGNOSIS — R58 Hemorrhage, not elsewhere classified: Secondary | ICD-10-CM | POA: Diagnosis present

## 2016-06-22 DIAGNOSIS — J9509 Other tracheostomy complication: Secondary | ICD-10-CM

## 2016-06-22 DIAGNOSIS — E1159 Type 2 diabetes mellitus with other circulatory complications: Secondary | ICD-10-CM | POA: Diagnosis present

## 2016-06-22 DIAGNOSIS — Y838 Other surgical procedures as the cause of abnormal reaction of the patient, or of later complication, without mention of misadventure at the time of the procedure: Secondary | ICD-10-CM | POA: Diagnosis present

## 2016-06-22 DIAGNOSIS — I152 Hypertension secondary to endocrine disorders: Secondary | ICD-10-CM | POA: Diagnosis present

## 2016-06-22 DIAGNOSIS — G4700 Insomnia, unspecified: Secondary | ICD-10-CM | POA: Diagnosis present

## 2016-06-22 DIAGNOSIS — E1142 Type 2 diabetes mellitus with diabetic polyneuropathy: Secondary | ICD-10-CM | POA: Diagnosis present

## 2016-06-22 DIAGNOSIS — E785 Hyperlipidemia, unspecified: Secondary | ICD-10-CM | POA: Diagnosis present

## 2016-06-22 DIAGNOSIS — L03818 Cellulitis of other sites: Secondary | ICD-10-CM | POA: Diagnosis present

## 2016-06-22 LAB — CBC WITH DIFFERENTIAL/PLATELET
Basophils Absolute: 0 10*3/uL (ref 0.0–0.1)
Basophils Relative: 0 %
Eosinophils Absolute: 0.2 10*3/uL (ref 0.0–0.7)
Eosinophils Relative: 3 %
HCT: 34.7 % — ABNORMAL LOW (ref 36.0–46.0)
Hemoglobin: 11.3 g/dL — ABNORMAL LOW (ref 12.0–15.0)
Lymphocytes Relative: 37 %
Lymphs Abs: 3.3 10*3/uL (ref 0.7–4.0)
MCH: 28.1 pg (ref 26.0–34.0)
MCHC: 32.6 g/dL (ref 30.0–36.0)
MCV: 86.3 fL (ref 78.0–100.0)
Monocytes Absolute: 0.9 10*3/uL (ref 0.1–1.0)
Monocytes Relative: 10 %
Neutro Abs: 4.5 10*3/uL (ref 1.7–7.7)
Neutrophils Relative %: 50 %
Platelets: 310 10*3/uL (ref 150–400)
RBC: 4.02 MIL/uL (ref 3.87–5.11)
RDW: 13.6 % (ref 11.5–15.5)
WBC: 8.9 10*3/uL (ref 4.0–10.5)

## 2016-06-22 LAB — COMPREHENSIVE METABOLIC PANEL
ALK PHOS: 64 U/L (ref 38–126)
ALT: 20 U/L (ref 14–54)
ANION GAP: 6 (ref 5–15)
AST: 17 U/L (ref 15–41)
Albumin: 3.5 g/dL (ref 3.5–5.0)
BUN: 20 mg/dL (ref 6–20)
CALCIUM: 8.7 mg/dL — AB (ref 8.9–10.3)
CO2: 29 mmol/L (ref 22–32)
CREATININE: 1.28 mg/dL — AB (ref 0.44–1.00)
Chloride: 105 mmol/L (ref 101–111)
GFR, EST AFRICAN AMERICAN: 54 mL/min — AB (ref >60)
GFR, EST NON AFRICAN AMERICAN: 47 mL/min — AB (ref >60)
Glucose, Bld: 112 mg/dL — ABNORMAL HIGH (ref 65–99)
Potassium: 3.2 mmol/L — ABNORMAL LOW (ref 3.5–5.1)
SODIUM: 140 mmol/L (ref 135–145)
TOTAL PROTEIN: 6.9 g/dL (ref 6.5–8.1)
Total Bilirubin: 0.5 mg/dL (ref 0.3–1.2)

## 2016-06-22 MED ORDER — TRAMADOL HCL 50 MG PO TABS
50.0000 mg | ORAL_TABLET | Freq: Once | ORAL | Status: AC
  Administered 2016-06-22: 50 mg via ORAL
  Filled 2016-06-22: qty 1

## 2016-06-22 NOTE — ED Provider Notes (Signed)
[facility]-EMERGENCY DEPT Provider Note   CSN: [id number] Arrival date & time: 06/22/16  1841   By signing my name below, I, Clovis PuAvnee Patel, attest that this documentation has been prepared under the direction and in the presence of Dione Boozeavid Kameren Baade, MD  Electronically Signed: Clovis PuAvnee Patel, ED Scribe. 06/22/16. 11:44 PM.  History   Chief Complaint No chief complaint on file.   The history is provided by the patient. No language interpreter was used.   HPI Comments:  Yesenia [name] is a 55 y.o. female, with a PSHx of a tracheostomy, who presents to the Emergency Department complaining of a gradual onset of an increase of red blood from her stoma x 3 days. She notes associated neck pain to the left of her tracheostomy x 3 days. Pt states her symptoms are worse when coughing. No alleviating factors noted. Pt denies difficulty breathing, any other associated symptoms and modifying factors at this time.   Past Medical History:  Diagnosis Date  . Anemia   . Anemia   . Chronic pain   . CKD (chronic kidney disease)   . Diabetes mellitus without complication (HCC)    diet controlled. does not check CBG's  . Fibromyalgia    hospitilized 12/16 due to inability to walk  . Hyperlipemia   . Hypertension   . Insomnia   . Neuropathy (HCC)   . Right sided weakness   . Stroke [facility](HCC) 2014?   no residual weakness   . Vitamin deficiency    Vit D    Patient Active Problem List   Diagnosis Date Noted  . Iron deficiency anemia 05/26/2016  . 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 Irritable bowel syndrome 05/21/2016  . Anxiety state 05/21/2016  . Tracheostomy dependent (HCC)   . Generalized weakness 04/13/2016  . Weakness generalized 04/13/2016  . HCAP (healthcare-associated pneumonia) 03/06/2016  . Fibromyalgia 02/22/2016  . Cellulitis 02/21/2016  . Abdominal pain 02/16/2016  . Constipation 02/16/2016  . CKD (chronic kidney disease) stage 3, GFR 30-59 ml/min 02/16/2016  . Fever   . Cough   . Sleep  apnea 02/13/2016  . Insomnia 08/10/2015  . Polyneuropathy (HCC) 07/10/2015  . GERD (gastroesophageal reflux disease) 07/10/2015  . Acute kidney injury superimposed on chronic kidney disease (HCC) 07/07/2015  . Gait disturbance 07/07/2015  . Morbid obesity due to excess calories (HCC) 07/07/2015  . Left hip pain 07/06/2015  . Chronic pain   . Right sided weakness   . Headache(784.0) 04/14/2013  . Encephalopathy acute 04/11/2013  . Acute pyelonephritis 04/11/2013  . Type 2 diabetes mellitus with stage 3 chronic kidney disease, without long-term current use of insulin (HCC) 04/11/2013  . Dyslipidemia 04/11/2013  . Chest pain 02/15/2013  . Cerebral infarction (HCC) 01/14/2013  . Essential hypertension 01/14/2013  . Anemia 01/14/2013    Past Surgical History:  Procedure Laterality Date  . KNEE SURGERY  1992, 1995, 1997  . LEG SURGERY Right 2007   Surgery x3 2 arthroscopies and one rod  . OOPHORECTOMY Right 1993?  . TRACHEOSTOMY TUBE PLACEMENT N/A 02/13/2016   Procedure: TRACHEOSTOMY;  Surgeon: Christia Readingwight Bates, MD;  [facility];  Service: ENT;  Laterality: N/A;    OB History    No data available       Home Medications    Prior to Admission medications   Medication Sig Start Date End Date Taking? Authorizing Provider  amLODipine (NORVASC) 10 MG tablet Take 10 mg by mouth daily. 02/23/16  Yes Historical Provider, MD  atenolol (TENORMIN) 50  MG tablet Take 50 mg by mouth daily. 02/16/16  Yes Historical Provider, MD  busPIRone (BUSPAR) 15 MG tablet Take 15 mg by mouth 2 (two) times daily.   Yes Historical Provider, MD  cholestyramine (QUESTRAN) 4 g packet Take 4 g by mouth daily.   Yes Historical Provider, MD  clonazePAM (KLONOPIN) 1 MG tablet Take 1 mg by mouth at bedtime.   Yes Historical Provider, MD  escitalopram (LEXAPRO) 10 MG tablet Take 10 mg by mouth daily.    Yes Historical Provider, MD  ferrous sulfate 325 (65 FE) MG tablet Take 325 mg by mouth daily. 09/13/14  Yes Historical  Provider, MD  gabapentin (NEURONTIN) 300 MG capsule Take 300 mg by mouth at bedtime.   Yes Historical Provider, MD  glipiZIDE (GLUCOTROL XL) 2.5 MG 24 hr tablet Take 1 tablet (2.5 mg total) by mouth daily with breakfast. 03/11/16  Yes Rhetta Mura, MD  hydrochlorothiazide (HYDRODIURIL) 25 MG tablet Take 0.5 tablets (12.5 mg total) by mouth daily. 03/11/16  Yes Rhetta Mura, MD  loperamide (IMODIUM) 2 MG capsule Take 2 mg by mouth 3 (three) times daily before meals.   Yes Historical Provider, MD  ondansetron (ZOFRAN) 4 MG tablet Take 4 mg by mouth daily. May also give one tablet every 8 hours prn   Yes Historical Provider, MD  saccharomyces boulardii (FLORASTOR) 250 MG capsule Take 250 mg by mouth 2 (two) times daily.   Yes Historical Provider, MD  traMADol (ULTRAM) 50 MG tablet Take 50 mg by mouth every 6 (six) hours as needed for moderate pain or severe pain.    Yes Historical Provider, MD  traZODone (DESYREL) 150 MG tablet Take 150 mg by mouth at bedtime.   Yes Historical Provider, MD  albuterol (PROVENTIL HFA;VENTOLIN HFA) 108 (90 BASE) MCG/ACT inhaler Inhale 2 puffs into the lungs every 6 (six) hours as needed for wheezing.    Historical Provider, MD  clonazePAM (KLONOPIN) 0.5 MG tablet Take one tablet by mouth once daily at bedtime Patient not taking: Reported on 06/22/2016 04/16/16   Kirt Boys, DO  hyoscyamine (ANASPAZ) 0.125 MG TBDP disintergrating tablet Place 0.125 mg under the tongue 3 (three) times daily as needed. For Gas-esophageal  Reflux    Historical Provider, MD  nystatin (NYSTATIN) powder Apply topically daily.    Historical Provider, MD    Family History Family History  Problem Relation Age of Onset  . Breast cancer Mother   . Leukemia Brother   . Breast cancer Maternal Grandmother     Social History Social History  Substance Use Topics  . Smoking status: Never Smoker  . Smokeless tobacco: Never Used  . Alcohol use No     Allergies   Buprenorphine  hcl and Morphine and related   Review of Systems Review of Systems  Respiratory: Positive for cough. Negative for shortness of breath.   Musculoskeletal: Positive for neck pain.  All other systems reviewed and are negative.  Physical Exam Updated Vital Signs BP 99/73 (BP Location: Left Arm)   Pulse 65   Temp 98.3 F (36.8 C) (Oral)   Resp 20   SpO2 97%   Physical Exam  Constitutional: She is oriented to person, place, and time. She appears well-developed and well-nourished.  HENT:  Head: Normocephalic and atraumatic.  Eyes: EOM are normal. Pupils are equal, round, and reactive to light.  Neck: Normal range of motion. Neck supple. No JVD present.  Tracheostomy in place. Tender to left side of tracheostomy but no mass  palpable. Dried blood seen in tracheostomy tube.   Cardiovascular: Normal rate, regular rhythm and normal heart sounds.   No murmur heard. Pulmonary/Chest: Effort normal and breath sounds normal. She has no wheezes. She has no rales. She exhibits no tenderness.  Abdominal: Soft. Bowel sounds are normal. She exhibits no distension and no mass. There is no tenderness.  Musculoskeletal: Normal range of motion. She exhibits edema.  Pre tibial edema  Lymphadenopathy:    She has no cervical adenopathy.  Neurological: She is alert and oriented to person, place, and time. No cranial nerve deficit. She exhibits normal muscle tone. Coordination normal.  Skin: Skin is warm and dry. No rash noted.  Psychiatric: She has a normal mood and affect. Her behavior is normal. Judgment and thought content normal.  Nursing note and vitals reviewed.    ED Treatments / Results  DIAGNOSTIC STUDIES:  Oxygen Saturation is 97% on Rossiter, normal by my interpretation.    COORDINATION OF CARE:  11:38 PM Discussed treatment plan with pt at bedside and pt agreed to plan.  Labs (all labs ordered are listed, but only abnormal results are displayed) Labs Reviewed  CBC WITH  DIFFERENTIAL/PLATELET - Abnormal; Notable for the following:       Result Value   Hemoglobin 11.3 (*)    HCT 34.7 (*)    All other components within normal limits  COMPREHENSIVE METABOLIC PANEL - Abnormal; Notable for the following:    Potassium 3.2 (*)    Glucose, Bld 112 (*)    Creatinine, Ser 1.28 (*)    Calcium 8.7 (*)    GFR calc non Af Amer 47 (*)    GFR calc Af Amer 54 (*)    All other components within normal limits     Radiology Dg Chest 2 View  Result Date: 06/22/2016 CLINICAL DATA:  Cough and chest pain EXAM: CHEST  2 VIEW COMPARISON:  Chest radiograph 05/11/2016 FINDINGS: Unchanged position of tracheostomy tube with tip at the level of the clavicular heads. Cardiomediastinal silhouette remains enlarged, unchanged. No focal airspace consolidation or pulmonary edema. No pneumothorax or pleural effusion. Bibasilar atelectasis. IMPRESSION: Unchanged cardiomegaly with bibasilar atelectasis. Electronically Signed   By: Deatra Robinson M.D.   On: 06/22/2016 22:18    Procedures Procedures (including critical care time)  Medications Ordered in ED Medications  potassium chloride SA (K-DUR,KLOR-CON) CR tablet 40 mEq (not administered)  traMADol (ULTRAM) tablet 50 mg (50 mg Oral Given 06/22/16 2036)     Initial Impression / Assessment and Plan / ED Course  I have reviewed the triage vital signs and the nursing notes.  Pertinent labs & imaging results that were available during my care of the patient were reviewed by me and considered in my medical decision making (see chart for details).  Clinical Course    Bleeding through tracheostomy which is not active currently. I've gone back to evaluate the patient several times and no recurrence of bleeding. Old records are reviewed and she had tracheostomy placed in August for obstructive sleep apnea. [facility] specifically for evaluation by ENT. I've discussed case with Dr. Jearld Fenton who requests  patient be admitted to the hospitalist and he will evaluate her in the morning. Case is discussed with Dr. Toniann Fail of triad hospitalists who agrees to admit the patient under observation status. She is noted to be hypokalemic and is given a dose of potassium chloride. Mild renal insufficiency is present unchanged from baseline. Mild anemia is  present which is also unchanged from baseline.  Final Clinical Impressions(s) / ED Diagnoses   Final diagnoses:  Tracheal hemorrhage (HCC)  Other tracheostomy complication (HCC)  Renal insufficiency  Hypokalemia  Normochromic normocytic anemia    New Prescriptions New Prescriptions   No medications on file  I personally performed the services described in this documentation, which was scribed in my presence. The recorded information has been reviewed and is accurate.       Dione Boozeavid Alany Borman, MD 06/23/16 [phone]

## 2016-06-22 NOTE — ED Notes (Signed)
Spoke with Dondra SpryGail, NP, she reports patient does not need an IV for transportation.

## 2016-06-22 NOTE — ED Notes (Signed)
Bed: WA02 Expected date:  Expected time:  Means of arrival:  Comments: EMS 

## 2016-06-22 NOTE — ED Provider Notes (Signed)
[facility]-EMERGENCY DEPT Provider Note   CSN: [id number] Arrival date & time: 06/22/16  1841  By signing my name below, I, Alyssa Grove, attest that this documentation has been prepared under the direction and in the presence of Earley Favor, NP. Electronically Signed: Alyssa Grove, ED Scribe. 06/22/16. 5:41 AM.  History   Chief Complaint No chief complaint on file.  The history is provided by the patient. No language interpreter was used.   HPI Comments: Yesenia [name] is a 55 y.o. female who presents to the Emergency Department complaining of gradual onset, red blood from stoma for 3 days. Pt reports associated cough, chest discomfort, nausea. Coughing is not chronic, but pt has experienced cough for more than 3 days.She also notes a firm mass next to stoma om the left that is painful to the touch for the past 3 days. She report she has had a tracheostomy tube since 02/13/16. She denies fever  Past Medical History:  Diagnosis Date  . Anemia   . Anemia   . Chronic pain   . CKD (chronic kidney disease)   . Diabetes mellitus without complication (HCC)    diet controlled. does not check CBG's  . Fibromyalgia    hospitilized 12/16 due to inability to walk  . Hyperlipemia   . Hypertension   . Insomnia   . Neuropathy (HCC)   . Right sided weakness   . Stroke [facility]) 2014?   no residual weakness   . Vitamin deficiency    Vit D    Patient Active Problem List   Diagnosis Date Noted  . Complication of tracheostomy (HCC) 06/23/2016  . Bleeding 06/23/2016  . Iron deficiency anemia 05/26/2016  . 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 Irritable bowel syndrome 05/21/2016  . Anxiety state 05/21/2016  . Tracheostomy dependent (HCC)   . Generalized weakness 04/13/2016  . Weakness generalized 04/13/2016  . HCAP (healthcare-associated pneumonia) 03/06/2016  . Fibromyalgia 02/22/2016  . Cellulitis 02/21/2016  . Abdominal pain 02/16/2016  . Constipation 02/16/2016  . CKD (chronic kidney  disease) stage 3, GFR 30-59 ml/min 02/16/2016  . Fever   . Cough   . Sleep apnea 02/13/2016  . Insomnia 08/10/2015  . Polyneuropathy (HCC) 07/10/2015  . GERD (gastroesophageal reflux disease) 07/10/2015  . Acute kidney injury superimposed on chronic kidney disease (HCC) 07/07/2015  . Gait disturbance 07/07/2015  . Morbid obesity due to excess calories (HCC) 07/07/2015  . Left hip pain 07/06/2015  . Chronic pain   . Right sided weakness   . Headache(784.0) 04/14/2013  . Encephalopathy acute 04/11/2013  . Acute pyelonephritis 04/11/2013  . Type 2 diabetes mellitus with stage 3 chronic kidney disease, without long-term current use of insulin (HCC) 04/11/2013  . Dyslipidemia 04/11/2013  . Chest pain 02/15/2013  . Cerebral infarction (HCC) 01/14/2013  . Essential hypertension 01/14/2013  . Anemia 01/14/2013    Past Surgical History:  Procedure Laterality Date  . KNEE SURGERY  1992, 1995, 1997  . LEG SURGERY Right 2007   Surgery x3 2 arthroscopies and one rod  . OOPHORECTOMY Right 1993?  . TRACHEOSTOMY TUBE PLACEMENT N/A 02/13/2016   Procedure: TRACHEOSTOMY;  Surgeon: Christia Reading, MD;  Location: [facility];  Service: ENT;  Laterality: N/A;    OB History    No data available       Home Medications    Prior to Admission medications   Medication Sig Start Date End Date Taking? Authorizing Provider  amLODipine (NORVASC) 10 MG tablet Take 10 mg by mouth daily.  02/23/16  Yes Historical Provider, MD  atenolol (TENORMIN) 50 MG tablet Take 50 mg by mouth daily. 02/16/16  Yes Historical Provider, MD  busPIRone (BUSPAR) 15 MG tablet Take 15 mg by mouth 2 (two) times daily.   Yes Historical Provider, MD  cholestyramine (QUESTRAN) 4 g packet Take 4 g by mouth daily.   Yes Historical Provider, MD  clonazePAM (KLONOPIN) 1 MG tablet Take 1 mg by mouth at bedtime.   Yes Historical Provider, MD  escitalopram (LEXAPRO) 10 MG tablet Take 10 mg by mouth daily.    Yes Historical Provider, MD  ferrous  sulfate 325 (65 FE) MG tablet Take 325 mg by mouth daily. 09/13/14  Yes Historical Provider, MD  gabapentin (NEURONTIN) 300 MG capsule Take 300 mg by mouth at bedtime.   Yes Historical Provider, MD  glipiZIDE (GLUCOTROL XL) 2.5 MG 24 hr tablet Take 1 tablet (2.5 mg total) by mouth daily with breakfast. 03/11/16  Yes Rhetta Mura, MD  hydrochlorothiazide (HYDRODIURIL) 25 MG tablet Take 0.5 tablets (12.5 mg total) by mouth daily. 03/11/16  Yes Rhetta Mura, MD  loperamide (IMODIUM) 2 MG capsule Take 2 mg by mouth 3 (three) times daily before meals.   Yes Historical Provider, MD  ondansetron (ZOFRAN) 4 MG tablet Take 4 mg by mouth daily. May also give one tablet every 8 hours prn   Yes Historical Provider, MD  saccharomyces boulardii (FLORASTOR) 250 MG capsule Take 250 mg by mouth 2 (two) times daily.   Yes Historical Provider, MD  traMADol (ULTRAM) 50 MG tablet Take 50 mg by mouth every 6 (six) hours as needed for moderate pain or severe pain.    Yes Historical Provider, MD  traZODone (DESYREL) 150 MG tablet Take 150 mg by mouth at bedtime.   Yes Historical Provider, MD  albuterol (PROVENTIL HFA;VENTOLIN HFA) 108 (90 BASE) MCG/ACT inhaler Inhale 2 puffs into the lungs every 6 (six) hours as needed for wheezing.    Historical Provider, MD  hyoscyamine (ANASPAZ) 0.125 MG TBDP disintergrating tablet Place 0.125 mg under the tongue 3 (three) times daily as needed. For Gas-esophageal  Reflux    Historical Provider, MD  nystatin (NYSTATIN) powder Apply topically daily.    Historical Provider, MD    Family History Family History  Problem Relation Age of Onset  . Breast cancer Mother   . Leukemia Brother   . Breast cancer Maternal Grandmother     Social History Social History  Substance Use Topics  . Smoking status: Never Smoker  . Smokeless tobacco: Never Used  . Alcohol use No     Allergies   Buprenorphine hcl and Morphine and related   Review of Systems Review of Systems    Constitutional: Negative for fever.  HENT: Negative for trouble swallowing.   Respiratory: Positive for cough. Negative for shortness of breath.   Cardiovascular: Positive for chest pain. Negative for palpitations and leg swelling.  Gastrointestinal: Negative for abdominal pain.       Bleeding from stoma  Genitourinary: Negative.   Musculoskeletal: Negative.   Neurological: Negative.   Psychiatric/Behavioral: Negative.   All other systems reviewed and are negative.    Physical Exam Updated Vital Signs BP (!) 96/45 (BP Location: Left Arm)   Pulse 60   Temp 98 F (36.7 C) (Oral)   Resp 16   Ht 5\' 7"  (1.702 m)   Wt (!) 152 kg   SpO2 100%   BMI 52.48 kg/m   Physical Exam  Constitutional: She is oriented  to person, place, and time. She appears well-developed and well-nourished. She is active. No distress.  HENT:  Head: Normocephalic and atraumatic.  Mouth/Throat: Oropharynx is clear and moist.  Eyes: Conjunctivae are normal.  Neck: Normal range of motion. No tracheal deviation present.  Bright red blood from stoma, firm mass noted to the left of stoma midline apx 3mm round  Cardiovascular: Normal rate.   Pulmonary/Chest: Effort normal and breath sounds normal.  Musculoskeletal: Normal range of motion.  Neurological: She is alert and oriented to person, place, and time.  Skin: Skin is warm and dry.  Psychiatric: She has a normal mood and affect. Her behavior is normal.  Nursing note and vitals reviewed.  ED Treatments / Results  DIAGNOSTIC STUDIES: Oxygen Saturation is 94% on RA, adequate by my interpretation.    COORDINATION OF CARE: 8:00 PM Discussed treatment plan with pt at bedside which includes Tramadol, CBC with Differential, DG Chest and CMP and pt agreed to plan.  Labs (all labs ordered are listed, but only abnormal results are displayed) Labs Reviewed  CBC WITH DIFFERENTIAL/PLATELET - Abnormal; Notable for the following:       Result Value   Hemoglobin  11.3 (*)    HCT 34.7 (*)    All other components within normal limits  COMPREHENSIVE METABOLIC PANEL - Abnormal; Notable for the following:    Potassium 3.2 (*)    Glucose, Bld 112 (*)    Creatinine, Ser 1.28 (*)    Calcium 8.7 (*)    GFR calc non Af Amer 47 (*)    GFR calc Af Amer 54 (*)    All other components within normal limits  BASIC METABOLIC PANEL - Abnormal; Notable for the following:    Potassium 3.3 (*)    Creatinine, Ser 1.17 (*)    GFR calc non Af Amer 52 (*)    All other components within normal limits  CBC - Abnormal; Notable for the following:    Hemoglobin 11.2 (*)    HCT 34.4 (*)    All other components within normal limits  MRSA PCR SCREENING  TYPE AND SCREEN    EKG  EKG Interpretation None       Radiology Dg Chest 2 View  Result Date: 06/22/2016 CLINICAL DATA:  Cough and chest pain EXAM: CHEST  2 VIEW COMPARISON:  Chest radiograph 05/11/2016 FINDINGS: Unchanged position of tracheostomy tube with tip at the level of the clavicular heads. Cardiomediastinal silhouette remains enlarged, unchanged. No focal airspace consolidation or pulmonary edema. No pneumothorax or pleural effusion. Bibasilar atelectasis. IMPRESSION: Unchanged cardiomegaly with bibasilar atelectasis. Electronically Signed   By: Deatra RobinsonKevin  Herman M.D.   On: 06/22/2016 22:18    Procedures Procedures (including critical care time)  Medications Ordered in ED Medications  clonazePAM (KLONOPIN) tablet 1 mg (1 mg Oral Given 06/23/16 0331)  gabapentin (NEURONTIN) capsule 300 mg (300 mg Oral Given 06/23/16 0331)  cholestyramine (QUESTRAN) packet 4 g (not administered)  traMADol (ULTRAM) tablet 50 mg (not administered)  amLODipine (NORVASC) tablet 10 mg (not administered)  atenolol (TENORMIN) tablet 50 mg (not administered)  hyoscyamine (ANASPAZ) disintergrating tablet 0.125 mg (not administered)  traZODone (DESYREL) tablet 150 mg (150 mg Oral Given 06/23/16 0331)  busPIRone (BUSPAR) tablet 15  mg (15 mg Oral Given 06/23/16 0331)  escitalopram (LEXAPRO) tablet 10 mg (not administered)  glipiZIDE (GLUCOTROL XL) 24 hr tablet 2.5 mg (not administered)  saccharomyces boulardii (FLORASTOR) capsule 250 mg (250 mg Oral Given 06/23/16 0331)  ondansetron (ZOFRAN) tablet 4  mg (not administered)  ferrous sulfate tablet 325 mg (not administered)  albuterol (PROVENTIL) (2.5 MG/3ML) 0.083% nebulizer solution 2.5 mg (not administered)  acetaminophen (TYLENOL) tablet 650 mg (not administered)    Or  acetaminophen (TYLENOL) suppository 650 mg (not administered)  ondansetron (ZOFRAN) tablet 4 mg (not administered)    Or  ondansetron (ZOFRAN) injection 4 mg (not administered)  insulin aspart (novoLOG) injection 0-9 Units (not administered)  MEDLINE mouth rinse (not administered)  traMADol (ULTRAM) tablet 50 mg (50 mg Oral Given 06/22/16 2036)  potassium chloride SA (K-DUR,KLOR-CON) CR tablet 40 mEq (40 mEq Oral Given 06/23/16 0208)     Initial Impression / Assessment and Plan / ED Course  I have reviewed the triage vital signs and the nursing notes.  Pertinent labs & imaging results that were available during my care of the patient were reviewed by me and considered in my medical decision making (see chart for details).  Clinical Course    I personally performed the services described in this documentation, which was scribed in my presence. The recorded information has been reviewed and is accurate.   Final Clinical Impressions(s) / ED Diagnoses   Final diagnoses:  Tracheal hemorrhage (HCC)  Other tracheostomy complication (HCC)  Renal insufficiency  Hypokalemia  Normochromic normocytic anemia    New Prescriptions Current Discharge Medication List       Earley FavorGail Leonid Manus, NP 06/23/16 0541    Derwood KaplanAnkit Nanavati, MD 06/26/16 [phone]

## 2016-06-22 NOTE — ED Notes (Signed)
[facility]

## 2016-06-22 NOTE — ED Triage Notes (Signed)
Per EMS, patient reports bright red blood from stoma x2 days. Patient reports suctioning out clots. Patient also reports chest wall pain and cough x2 days. Denies N/V/D and abdominal pain.

## 2016-06-22 NOTE — Progress Notes (Signed)
RN told RT about patient. RT placed humidification set up on 28% ATC. RT suctioned and did trach care x 3. RT got some bloodclots from trach.RT bagged patient to make sure patient was clear of any blockages, Patient was easy to bag . Patient tolerated well.

## 2016-06-22 NOTE — ED Notes (Signed)
Not[facility].

## 2016-06-23 ENCOUNTER — Encounter (HOSPITAL_COMMUNITY): Payer: Self-pay | Admitting: Internal Medicine

## 2016-06-23 ENCOUNTER — Ambulatory Visit (HOSPITAL_COMMUNITY): Payer: Medicaid Other

## 2016-06-23 DIAGNOSIS — E1122 Type 2 diabetes mellitus with diabetic chronic kidney disease: Secondary | ICD-10-CM

## 2016-06-23 DIAGNOSIS — L03818 Cellulitis of other sites: Secondary | ICD-10-CM | POA: Diagnosis present

## 2016-06-23 DIAGNOSIS — G4700 Insomnia, unspecified: Secondary | ICD-10-CM | POA: Diagnosis present

## 2016-06-23 DIAGNOSIS — K219 Gastro-esophageal reflux disease without esophagitis: Secondary | ICD-10-CM | POA: Diagnosis present

## 2016-06-23 DIAGNOSIS — R58 Hemorrhage, not elsewhere classified: Secondary | ICD-10-CM | POA: Diagnosis present

## 2016-06-23 DIAGNOSIS — Z90722 Acquired absence of ovaries, bilateral: Secondary | ICD-10-CM | POA: Diagnosis not present

## 2016-06-23 DIAGNOSIS — I10 Essential (primary) hypertension: Secondary | ICD-10-CM | POA: Diagnosis present

## 2016-06-23 DIAGNOSIS — Y838 Other surgical procedures as the cause of abnormal reaction of the patient, or of later complication, without mention of misadventure at the time of the procedure: Secondary | ICD-10-CM | POA: Diagnosis present

## 2016-06-23 DIAGNOSIS — N183 Chronic kidney disease, stage 3 (moderate): Secondary | ICD-10-CM | POA: Diagnosis present

## 2016-06-23 DIAGNOSIS — J9500 Unspecified tracheostomy complication: Secondary | ICD-10-CM | POA: Diagnosis present

## 2016-06-23 DIAGNOSIS — G629 Polyneuropathy, unspecified: Secondary | ICD-10-CM | POA: Diagnosis present

## 2016-06-23 DIAGNOSIS — G4733 Obstructive sleep apnea (adult) (pediatric): Secondary | ICD-10-CM | POA: Diagnosis present

## 2016-06-23 DIAGNOSIS — E785 Hyperlipidemia, unspecified: Secondary | ICD-10-CM | POA: Diagnosis present

## 2016-06-23 DIAGNOSIS — J9509 Other tracheostomy complication: Secondary | ICD-10-CM | POA: Diagnosis present

## 2016-06-23 DIAGNOSIS — M797 Fibromyalgia: Secondary | ICD-10-CM | POA: Diagnosis present

## 2016-06-23 DIAGNOSIS — D649 Anemia, unspecified: Secondary | ICD-10-CM | POA: Diagnosis present

## 2016-06-23 DIAGNOSIS — J9501 Hemorrhage from tracheostomy stoma: Secondary | ICD-10-CM | POA: Diagnosis present

## 2016-06-23 DIAGNOSIS — G8929 Other chronic pain: Secondary | ICD-10-CM | POA: Diagnosis present

## 2016-06-23 DIAGNOSIS — E876 Hypokalemia: Secondary | ICD-10-CM | POA: Diagnosis present

## 2016-06-23 DIAGNOSIS — Z888 Allergy status to other drugs, medicaments and biological substances status: Secondary | ICD-10-CM | POA: Diagnosis not present

## 2016-06-23 DIAGNOSIS — Z885 Allergy status to narcotic agent status: Secondary | ICD-10-CM | POA: Diagnosis not present

## 2016-06-23 HISTORY — DX: Hemorrhage, not elsewhere classified: R58

## 2016-06-23 LAB — BASIC METABOLIC PANEL
ANION GAP: 8 (ref 5–15)
BUN: 14 mg/dL (ref 6–20)
CALCIUM: 9 mg/dL (ref 8.9–10.3)
CO2: 28 mmol/L (ref 22–32)
Chloride: 105 mmol/L (ref 101–111)
Creatinine, Ser: 1.17 mg/dL — ABNORMAL HIGH (ref 0.44–1.00)
GFR calc Af Amer: >60 mL/min (ref >60)
GFR, EST NON AFRICAN AMERICAN: 52 mL/min — AB (ref >60)
Glucose, Bld: 91 mg/dL (ref 65–99)
POTASSIUM: 3.3 mmol/L — AB (ref 3.5–5.1)
SODIUM: 141 mmol/L (ref 135–145)

## 2016-06-23 LAB — GLUCOSE, CAPILLARY
GLUCOSE-CAPILLARY: 92 mg/dL (ref 65–99)
Glucose-Capillary: 101 mg/dL — ABNORMAL HIGH (ref 65–99)
Glucose-Capillary: 89 mg/dL (ref 65–99)
Glucose-Capillary: 77 mg/dL (ref 65–99)

## 2016-06-23 LAB — TYPE AND SCREEN
ABO/RH(D): O POS
ANTIBODY SCREEN: NEGATIVE

## 2016-06-23 LAB — CBC
HEMATOCRIT: 34.4 % — AB (ref 36.0–46.0)
HEMOGLOBIN: 11.2 g/dL — AB (ref 12.0–15.0)
MCH: 27.7 pg (ref 26.0–34.0)
MCHC: 32.6 g/dL (ref 30.0–36.0)
MCV: 85.1 fL (ref 78.0–100.0)
Platelets: 300 10*3/uL (ref 150–400)
RBC: 4.04 MIL/uL (ref 3.87–5.11)
RDW: 13.4 % (ref 11.5–15.5)
WBC: 9 10*3/uL (ref 4.0–10.5)

## 2016-06-23 LAB — MRSA PCR SCREENING: MRSA by PCR: POSITIVE — AB

## 2016-06-23 MED ORDER — ACETAMINOPHEN 650 MG RE SUPP: 650.0000 mg | Freq: Four times a day (QID) | RECTAL | Status: DC | PRN

## 2016-06-23 MED ORDER — TRAMADOL HCL 50 MG PO TABS
50.0000 mg | ORAL_TABLET | Freq: Four times a day (QID) | ORAL | Status: DC | PRN
  Administered 2016-06-23 (×2): 50 mg via ORAL
  Filled 2016-06-23 (×2): qty 1

## 2016-06-23 MED ORDER — ESCITALOPRAM OXALATE 10 MG PO TABS
10.0000 mg | ORAL_TABLET | Freq: Every day | ORAL | Status: DC
  Administered 2016-06-23 – 2016-06-24 (×2): 10 mg via ORAL
  Filled 2016-06-23 (×2): qty 1

## 2016-06-23 MED ORDER — CLONAZEPAM 0.5 MG PO TABS
1.0000 mg | ORAL_TABLET | Freq: Every day | ORAL | Status: DC
Start: 2016-06-23 — End: 2016-06-24
  Administered 2016-06-23 (×2): 1 mg via ORAL
  Filled 2016-06-23 (×2): qty 2

## 2016-06-23 MED ORDER — MUPIROCIN 2 % EX OINT
1.0000 "application " | TOPICAL_OINTMENT | Freq: Two times a day (BID) | CUTANEOUS | Status: DC
  Administered 2016-06-23 – 2016-06-24 (×3): 1 via NASAL
  Filled 2016-06-23 (×2): qty 22

## 2016-06-23 MED ORDER — CHLORHEXIDINE GLUCONATE CLOTH 2 % EX PADS: 6.0000 | MEDICATED_PAD | Freq: Every day | CUTANEOUS | Status: DC

## 2016-06-23 MED ORDER — GABAPENTIN 300 MG PO CAPS
300.0000 mg | ORAL_CAPSULE | Freq: Every day | ORAL | Status: DC
  Administered 2016-06-23 (×2): 300 mg via ORAL
  Filled 2016-06-23 (×2): qty 1

## 2016-06-23 MED ORDER — ACETAMINOPHEN 325 MG PO TABS: 650.0000 mg | ORAL_TABLET | Freq: Four times a day (QID) | ORAL | Status: DC | PRN

## 2016-06-23 MED ORDER — LOPERAMIDE HCL 2 MG PO CAPS
2.0000 mg | ORAL_CAPSULE | Freq: Three times a day (TID) | ORAL | Status: DC
  Administered 2016-06-23 – 2016-06-24 (×4): 2 mg via ORAL
  Filled 2016-06-23 (×4): qty 1

## 2016-06-23 MED ORDER — ATENOLOL 50 MG PO TABS: 50.0000 mg | ORAL_TABLET | Freq: Every day | ORAL | Status: DC

## 2016-06-23 MED ORDER — ONDANSETRON HCL 4 MG PO TABS
4.0000 mg | ORAL_TABLET | Freq: Every day | ORAL | Status: DC
  Administered 2016-06-23 – 2016-06-24 (×2): 4 mg via ORAL
  Filled 2016-06-23 (×2): qty 1

## 2016-06-23 MED ORDER — ONDANSETRON HCL 4 MG PO TABS: 4.0000 mg | ORAL_TABLET | Freq: Four times a day (QID) | ORAL | Status: DC | PRN

## 2016-06-23 MED ORDER — FERROUS SULFATE 325 (65 FE) MG PO TABS
325.0000 mg | ORAL_TABLET | Freq: Every day | ORAL | Status: DC
  Administered 2016-06-23 – 2016-06-24 (×2): 325 mg via ORAL
  Filled 2016-06-23 (×2): qty 1

## 2016-06-23 MED ORDER — INSULIN ASPART 100 UNIT/ML ~~LOC~~ SOLN: 0.0000 [IU] | Freq: Three times a day (TID) | SUBCUTANEOUS | Status: DC

## 2016-06-23 MED ORDER — TRAZODONE HCL 50 MG PO TABS
150.0000 mg | ORAL_TABLET | Freq: Every day | ORAL | Status: DC
  Administered 2016-06-23 (×2): 150 mg via ORAL
  Filled 2016-06-23 (×2): qty 1

## 2016-06-23 MED ORDER — GLIPIZIDE ER 2.5 MG PO TB24
2.5000 mg | ORAL_TABLET | Freq: Every day | ORAL | Status: DC
  Administered 2016-06-23 – 2016-06-24 (×2): 2.5 mg via ORAL
  Filled 2016-06-23 (×3): qty 1

## 2016-06-23 MED ORDER — POTASSIUM CHLORIDE CRYS ER 20 MEQ PO TBCR
40.0000 meq | EXTENDED_RELEASE_TABLET | Freq: Once | ORAL | Status: AC
  Administered 2016-06-23: 40 meq via ORAL
  Filled 2016-06-23: qty 2

## 2016-06-23 MED ORDER — CHOLESTYRAMINE 4 G PO PACK
4.0000 g | PACK | ORAL | Status: DC
  Administered 2016-06-24: 4 g via ORAL
  Filled 2016-06-23 (×3): qty 1

## 2016-06-23 MED ORDER — ORAL CARE MOUTH RINSE
15.0000 mL | Freq: Two times a day (BID) | OROMUCOSAL | Status: DC
  Administered 2016-06-23 – 2016-06-24 (×3): 15 mL via OROMUCOSAL

## 2016-06-23 MED ORDER — ONDANSETRON HCL 4 MG/2ML IJ SOLN: 4.0000 mg | Freq: Four times a day (QID) | INTRAMUSCULAR | Status: DC | PRN

## 2016-06-23 MED ORDER — ALBUTEROL SULFATE (2.5 MG/3ML) 0.083% IN NEBU: 2.5000 mg | INHALATION_SOLUTION | Freq: Four times a day (QID) | RESPIRATORY_TRACT | Status: DC | PRN

## 2016-06-23 MED ORDER — SACCHAROMYCES BOULARDII 250 MG PO CAPS
250.0000 mg | ORAL_CAPSULE | Freq: Two times a day (BID) | ORAL | Status: DC
  Administered 2016-06-23 – 2016-06-24 (×4): 250 mg via ORAL
  Filled 2016-06-23 (×4): qty 1

## 2016-06-23 MED ORDER — AMLODIPINE BESYLATE 5 MG PO TABS: 10.0000 mg | ORAL_TABLET | Freq: Every day | ORAL | Status: DC

## 2016-06-23 MED ORDER — HYOSCYAMINE SULFATE 0.125 MG PO TBDP: 0.1250 mg | ORAL_TABLET | Freq: Three times a day (TID) | ORAL | Status: DC | PRN

## 2016-06-23 MED ORDER — BUSPIRONE HCL 15 MG PO TABS
15.0000 mg | ORAL_TABLET | Freq: Two times a day (BID) | ORAL | Status: DC
  Administered 2016-06-23 – 2016-06-24 (×4): 15 mg via ORAL
  Filled 2016-06-23 (×4): qty 1

## 2016-06-23 NOTE — Consult Note (Signed)
Reason for Consult:bleeding trach Referring Physician: hospitalist  Yesenia [name] is an 55 y.o. female.  HPI: hx of trach in August by Dr Redmond Baseman. She has had about one other episode of bleeding besides this one. SHe has been bleeding for 3 days and presented to hospital. She is currently not bleeding. She had blood coming around the trach site and blood at the tiop of the inner cannula. She has some discomfort at the supperior edge of the stoma. There is a 'knot' on the left inferior aspect according to patient. She is doing well with the sleep apnea.  Past Medical History:  Diagnosis Date  . Anemia   . Anemia   . Chronic pain   . CKD (chronic kidney disease)   . Diabetes mellitus without complication (HCC)    diet controlled. does not check CBG's  . Fibromyalgia    hospitilized 12/16 due to inability to walk  . Hyperlipemia   . Hypertension   . Insomnia   . Neuropathy ([place])   . Right sided weakness   . Stroke [facility]) 2014?   no residual weakness   . Vitamin deficiency    Vit D    Past Surgical History:  Procedure Laterality Date  . [place]  . LEG SURGERY Right 2007   Surgery x3 2 arthroscopies and one rod  . OOPHORECTOMY Right 1993?  . TRACHEOSTOMY TUBE PLACEMENT N/A 02/13/2016   Procedure: TRACHEOSTOMY;  Surgeon: Melida Quitter, MD;  Location: [facility] OR;  Service: ENT;  Laterality: N/A;    Family History  Problem Relation Age of Onset  . Breast cancer Mother   . Leukemia Brother   . Breast cancer Maternal Grandmother     Social History:  reports that she has never smoked. She has never used smokeless tobacco. She reports that she does not drink alcohol or use drugs.  Allergies:  Allergies  Allergen Reactions  . Buprenorphine Hcl Hives  . Morphine And Related Hives and Dermatitis    Medications: I have reviewed the patient's current medications.  Results for orders placed or performed during the hospital encounter of 06/22/16 (from the past 48  hour(s))  CBC with Differential     Status: Abnormal   Collection Time: 06/22/16  8:18 PM  Result Value Ref Range   WBC 8.9 4.0 - 10.5 K/uL   RBC 4.02 3.87 - 5.11 MIL/uL   Hemoglobin 11.3 (L) 12.0 - 15.0 g/dL   HCT 34.7 (L) 36.0 - 46.0 %   MCV 86.3 78.0 - 100.0 fL   MCH 28.1 26.0 - 34.0 pg   MCHC 32.6 30.0 - 36.0 g/dL   RDW 13.6 11.5 - 15.5 %   Platelets 310 150 - 400 K/uL   Neutrophils Relative % 50 %   Neutro Abs 4.5 1.7 - 7.7 K/uL   Lymphocytes Relative 37 %   Lymphs Abs 3.3 0.7 - 4.0 K/uL   Monocytes Relative 10 %   Monocytes Absolute 0.9 0.1 - 1.0 K/uL   Eosinophils Relative 3 %   Eosinophils Absolute 0.2 0.0 - 0.7 K/uL   Basophils Relative 0 %   Basophils Absolute 0.0 0.0 - 0.1 K/uL  Comprehensive metabolic panel     Status: Abnormal   Collection Time: 06/22/16  8:18 PM  Result Value Ref Range   Sodium 140 135 - 145 mmol/L   Potassium 3.2 (L) 3.5 - 5.1 mmol/L   Chloride 105 101 - 111 mmol/L   CO2 29 22 - 32  mmol/L   Glucose, Bld 112 (H) 65 - 99 mg/dL   BUN 20 6 - 20 mg/dL   Creatinine, Ser 1.28 (H) 0.44 - 1.00 mg/dL   Calcium 8.7 (L) 8.9 - 10.3 mg/dL   Total Protein 6.9 6.5 - 8.1 g/dL   Albumin 3.5 3.5 - 5.0 g/dL   AST 17 15 - 41 U/L   ALT 20 14 - 54 U/L   Alkaline Phosphatase 64 38 - 126 U/L   Total Bilirubin 0.5 0.3 - 1.2 mg/dL   GFR calc non Af Amer 47 (L) >60 mL/min   GFR calc Af Amer 54 (L) >60 mL/min    Comment: (NOTE) The eGFR has been calculated using the CKD EPI equation. This calculation has not been validated in all clinical situations. eGFR's persistently <60 mL/min signify possible Chronic Kidney Disease.    Anion gap 6 5 - 15  Type and screen [place]     Status: None   Collection Time: 06/23/16  3:15 AM  Result Value Ref Range   ABO/RH(D) O POS    Antibody Screen NEG    Sample Expiration 68/05/5725   Basic metabolic panel     Status: Abnormal   Collection Time: 06/23/16  3:18 AM  Result Value Ref Range   Sodium 141 135  - 145 mmol/L   Potassium 3.3 (L) 3.5 - 5.1 mmol/L   Chloride 105 101 - 111 mmol/L   CO2 28 22 - 32 mmol/L   Glucose, Bld 91 65 - 99 mg/dL   BUN 14 6 - 20 mg/dL   Creatinine, Ser 1.17 (H) 0.44 - 1.00 mg/dL   Calcium 9.0 8.9 - 10.3 mg/dL   GFR calc non Af Amer 52 (L) >60 mL/min   GFR calc Af Amer >60 >60 mL/min    Comment: (NOTE) The eGFR has been calculated using the CKD EPI equation. This calculation has not been validated in all clinical situations. eGFR's persistently <60 mL/min signify possible Chronic Kidney Disease.    Anion gap 8 5 - 15  CBC     Status: Abnormal   Collection Time: 06/23/16  3:18 AM  Result Value Ref Range   WBC 9.0 4.0 - 10.5 K/uL   RBC 4.04 3.87 - 5.11 MIL/uL   Hemoglobin 11.2 (L) 12.0 - 15.0 g/dL   HCT 34.4 (L) 36.0 - 46.0 %   MCV 85.1 78.0 - 100.0 fL   MCH 27.7 26.0 - 34.0 pg   MCHC 32.6 30.0 - 36.0 g/dL   RDW 13.4 11.5 - 15.5 %   Platelets 300 150 - 400 K/uL  Glucose, capillary     Status: Abnormal   Collection Time: 06/23/16  8:00 AM  Result Value Ref Range   Glucose-Capillary 101 (H) 65 - 99 mg/dL    Dg Chest 2 View  Result Date: 06/22/2016 CLINICAL DATA:  Cough and chest pain EXAM: CHEST  2 VIEW COMPARISON:  Chest radiograph 05/11/2016 FINDINGS: Unchanged position of tracheostomy tube with tip at the level of the clavicular heads. Cardiomediastinal silhouette remains enlarged, unchanged. No focal airspace consolidation or pulmonary edema. No pneumothorax or pleural effusion. Bibasilar atelectasis. IMPRESSION: Unchanged cardiomegaly with bibasilar atelectasis. Electronically Signed   By: Ulyses Jarred M.D.   On: 06/22/2016 22:18    ROS Blood pressure (!) 96/45, pulse 60, temperature 98 F (36.7 C), temperature source Oral, resp. rate 16, height _0  (1.702 m), weight (!) 152 kg (335 lb 1.6 oz), SpO2 100 %. Physical Exam  Constitutional: She appears well-developed and well-nourished.  HENT:  She is alert and no distress. She is able to voice  with Evonnie Dawes valve. The trach looks clean and no clot. Palpation reveals some scar tissue and likely granulation tissue around the edge and it is tender on the superior left area. The inner cannula has some old blood on the tip. The neck is without swelling  Neck: Normal range of motion. Neck supple.  Cardiovascular: Normal rate.   Respiratory: Effort normal.  GI: Soft.  Musculoskeletal: Normal range of motion.  Skin: Skin is warm.  Psychiatric: She has a normal mood and affect.    Assessment/Plan: Bleeding trach- the knot is some scarring around the edge of the stoma. She needs a FOE of the trachea and may need OR exam to see where the source is. I will discuss with Dr Redmond Baseman and come up with a plan.   Melissa Montane 06/23/2016, 8:03 AM

## 2016-06-23 NOTE — H&P (Signed)
History and Physical    Yesenia [name] RUE:[id number] DOB: 1961/06/15 DOA: 06/22/2016  PCP: Reather ConverseMASSENBURG,O'LAF, PA-C  Patient coming from: Home.  Chief Complaint: Bleeding from tracheostomy site.  HPI: Yesenia [name] is a 55 y.o. female with history of diabetes mellitus type 2, hypertension, sleep apnea on tracheostomy presents to the ER because of bleeding from the trach site. Patient states she noticed some lump behind the trach site one week ago and started coughing up blood last 4 days. Denies any fever chills shortness of breath. Chest x-ray does not show nothing acute. On-call ENT surgeon Dr. Jenne PaneBates was consulted by the ER physician and at this time Dr. Jenne PaneBates has requested observation. Patient's blood pressure is in the low normal though patient does not look infected or septic.   ED Course: Chest x-ray does not show anything acute. ENT surgery was consulted.  Review of Systems: As per HPI, rest all negative.   Past Medical History:  Diagnosis Date  . Anemia   . Anemia   . Chronic pain   . CKD (chronic kidney disease)   . Diabetes mellitus without complication (HCC)    diet controlled. does not check CBG's  . Fibromyalgia    hospitilized 12/16 due to inability to walk  . Hyperlipemia   . Hypertension   . Insomnia   . Neuropathy (HCC)   . Right sided weakness   . Stroke [facility](HCC) 2014?   no residual weakness   . Vitamin deficiency    Vit D    Past Surgical History:  Procedure Laterality Date  . KNEE SURGERY  1992, 1995, 1997  . LEG SURGERY Right 2007   Surgery x3 2 arthroscopies and one rod  . OOPHORECTOMY Right 1993?  . TRACHEOSTOMY TUBE PLACEMENT N/A 02/13/2016   Procedure: TRACHEOSTOMY;  Surgeon: Christia Readingwight Bates, MD;  [facility];  Service: ENT;  Laterality: N/A;     reports that she has never smoked. She has never used smokeless tobacco. She reports that she does not drink alcohol or use drugs.  Allergies  Allergen Reactions  . Buprenorphine Hcl Hives  . Morphine  And Related Hives and Dermatitis    Family History  Problem Relation Age of Onset  . Breast cancer Mother   . Leukemia Brother   . Breast cancer Maternal Grandmother     Prior to Admission medications   Medication Sig Start Date End Date Taking? Authorizing Provider  amLODipine (NORVASC) 10 MG tablet Take 10 mg by mouth daily. 02/23/16  Yes Historical Provider, MD  atenolol (TENORMIN) 50 MG tablet Take 50 mg by mouth daily. 02/16/16  Yes Historical Provider, MD  busPIRone (BUSPAR) 15 MG tablet Take 15 mg by mouth 2 (two) times daily.   Yes Historical Provider, MD  cholestyramine (QUESTRAN) 4 g packet Take 4 g by mouth daily.   Yes Historical Provider, MD  clonazePAM (KLONOPIN) 1 MG tablet Take 1 mg by mouth at bedtime.   Yes Historical Provider, MD  escitalopram (LEXAPRO) 10 MG tablet Take 10 mg by mouth daily.    Yes Historical Provider, MD  ferrous sulfate 325 (65 FE) MG tablet Take 325 mg by mouth daily. 09/13/14  Yes Historical Provider, MD  gabapentin (NEURONTIN) 300 MG capsule Take 300 mg by mouth at bedtime.   Yes Historical Provider, MD  glipiZIDE (GLUCOTROL XL) 2.5 MG 24 hr tablet Take 1 tablet (2.5 mg total) by mouth daily with breakfast. 03/11/16  Yes Rhetta MuraJai-Gurmukh Samtani, MD  hydrochlorothiazide (HYDRODIURIL) 25 MG tablet Take  0.5 tablets (12.5 mg total) by mouth daily. 03/11/16  Yes Rhetta MuraJai-Gurmukh Samtani, MD  loperamide (IMODIUM) 2 MG capsule Take 2 mg by mouth 3 (three) times daily before meals.   Yes Historical Provider, MD  ondansetron (ZOFRAN) 4 MG tablet Take 4 mg by mouth daily. May also give one tablet every 8 hours prn   Yes Historical Provider, MD  saccharomyces boulardii (FLORASTOR) 250 MG capsule Take 250 mg by mouth 2 (two) times daily.   Yes Historical Provider, MD  traMADol (ULTRAM) 50 MG tablet Take 50 mg by mouth every 6 (six) hours as needed for moderate pain or severe pain.    Yes Historical Provider, MD  traZODone (DESYREL) 150 MG tablet Take 150 mg by mouth at  bedtime.   Yes Historical Provider, MD  albuterol (PROVENTIL HFA;VENTOLIN HFA) 108 (90 BASE) MCG/ACT inhaler Inhale 2 puffs into the lungs every 6 (six) hours as needed for wheezing.    Historical Provider, MD  hyoscyamine (ANASPAZ) 0.125 MG TBDP disintergrating tablet Place 0.125 mg under the tongue 3 (three) times daily as needed. For Gas-esophageal  Reflux    Historical Provider, MD  nystatin (NYSTATIN) powder Apply topically daily.    Historical Provider, MD    Physical Exam: Vitals:   06/23/16 0030 06/23/16 0100 06/23/16 0130 06/23/16 0200  BP: 109/61 (!) 123/108 108/57 104/58  Pulse: 67 74 68 66  Resp: 19 17 18 18   Temp:      TempSrc:      SpO2: 94% 92% 97% 94%      Constitutional: Moderately built and nourished. Vitals:   06/23/16 0030 06/23/16 0100 06/23/16 0130 06/23/16 0200  BP: 109/61 (!) 123/108 108/57 104/58  Pulse: 67 74 68 66  Resp: 19 17 18 18   Temp:      TempSrc:      SpO2: 94% 92% 97% 94%   Eyes: Anicteric no pallor. ENMT: No discharge from the ears eyes nose and mouth. Neck: Tracheostomy seen. No active discharge. Respiratory: No rhonchi or crepitations. Cardiovascular: S1-S2 heard. Abdomen: Soft nontender bowel sounds present. Musculoskeletal: No edema. No joint effusion. Skin: No skin rash. Skin appears warm. Neurologic: Alert awake oriented to time place and person. Moves all extremities. Psychiatric: Appears normal. Normal affect.   Labs on Admission: I have personally reviewed following labs and imaging studies  CBC:  Recent Labs Lab 06/22/16 2018  WBC 8.9  NEUTROABS 4.5  HGB 11.3*  HCT 34.7*  MCV 86.3  PLT 310   Basic Metabolic Panel:  Recent Labs Lab 06/22/16 2018  NA 140  K 3.2*  CL 105  CO2 29  GLUCOSE 112*  BUN 20  CREATININE 1.28*  CALCIUM 8.7*   GFR: Estimated Creatinine Clearance: 76.8 mL/min (by C-G formula based on SCr of 1.28 mg/dL (H)). Liver Function Tests:  Recent Labs Lab 06/22/16 2018  AST 17  ALT 20    ALKPHOS 64  BILITOT 0.5  PROT 6.9  ALBUMIN 3.5   No results for input(s): LIPASE, AMYLASE in the last 168 hours. No results for input(s): AMMONIA in the last 168 hours. Coagulation Profile: No results for input(s): INR, PROTIME in the last 168 hours. Cardiac Enzymes: No results for input(s): CKTOTAL, CKMB, CKMBINDEX, TROPONINI in the last 168 hours. BNP (last 3 results) No results for input(s): PROBNP in the last 8760 hours. HbA1C: No results for input(s): HGBA1C in the last 72 hours. CBG: No results for input(s): GLUCAP in the last 168 hours. Lipid Profile: No results for  input(s): CHOL, HDL, LDLCALC, TRIG, CHOLHDL, LDLDIRECT in the last 72 hours. Thyroid Function Tests: No results for input(s): TSH, T4TOTAL, FREET4, T3FREE, THYROIDAB in the last 72 hours. Anemia Panel: No results for input(s): VITAMINB12, FOLATE, FERRITIN, TIBC, IRON, RETICCTPCT in the last 72 hours. Urine analysis:    Component Value Date/Time   COLORURINE YELLOW 05/12/2016 0334   APPEARANCEUR CLOUDY (A) 05/12/2016 0334   LABSPEC 1.022 05/12/2016 0334   PHURINE 5.5 05/12/2016 0334   GLUCOSEU NEGATIVE 05/12/2016 0334   HGBUR LARGE (A) 05/12/2016 0334   BILIRUBINUR NEGATIVE 05/12/2016 0334   KETONESUR NEGATIVE 05/12/2016 0334   PROTEINUR NEGATIVE 05/12/2016 0334   UROBILINOGEN 0.2 06/16/2014 0955   NITRITE NEGATIVE 05/12/2016 0334   LEUKOCYTESUR NEGATIVE 05/12/2016 0334   Sepsis Labs: @LABRCNTIP (procalcitonin:4,lacticidven:4) )No results found for this or any previous visit (from the past 240 hour(s)).   Radiological Exams on Admission: Dg Chest 2 View  Result Date: 06/22/2016 CLINICAL DATA:  Cough and chest pain EXAM: CHEST  2 VIEW COMPARISON:  Chest radiograph 05/11/2016 FINDINGS: Unchanged position of tracheostomy tube with tip at the level of the clavicular heads. Cardiomediastinal silhouette remains enlarged, unchanged. No focal airspace consolidation or pulmonary edema. No pneumothorax or  pleural effusion. Bibasilar atelectasis. IMPRESSION: Unchanged cardiomegaly with bibasilar atelectasis. Electronically Signed   By: Deatra Robinson M.D.   On: 06/22/2016 22:18     Assessment/Plan Principal Problem:   Complication of tracheostomy (HCC) Active Problems:   Essential hypertension   Type 2 diabetes mellitus with stage 3 chronic kidney disease, without long-term current use of insulin (HCC)   Sleep apnea   CKD (chronic kidney disease) stage 3, GFR 30-59 ml/min   Bleeding    1. Bleeding from tracheostomy site - Dr. Jenne Pane on-call ENT surgeon has been consulted. Closely follow CBC. 2. History of hypertension presently blood pressure is in the low-normal - will hold antihypertensives and closely observe. 3. Diabetes mellitus type 2 - continue home medications. Patient placed on sliding scale coverage. 4. Chronic anemia - follow CBC. 5. Sleep apnea on trach.   DVT prophylaxis: SCDs. Code Status: Full code.  Family Communication: Discussed with patient.  Disposition Plan: Home.  Consults called: Dr. Jenne Pane ENT surgeon.  Admission status: Observation.    Eduard Clos MD [facility] Pager [phone].  If 7PM-7AM, please contact night-coverage [email or web address] Password [email or web address]  06/23/2016, 2:52 AM

## 2016-06-23 NOTE — Progress Notes (Signed)
   Subjective:    Patient ID: Yesenia [name], female    DOB: 1961-03-10, 55 y.o.   MRN: [id number]  Yesenia Mayorach bleeding noted and evaluated by Dr. Jearld FentonByers this morning.  No further bleeding today.  She notices firmness to the sides of the trach tube.      Review of Systems  Respiratory: Positive for cough.   All other systems reviewed and are negative.      Objective:   Physical Exam  Constitutional: She appears well-developed and well-nourished. No distress.  Neck:  #6 fenestrated Shiley trach tube in place with non-fenestrated inner cannula.  Stoma granulated at edges with some soft tissue firmness and tenderness to each side and firm scar inferiorly.  No active bleeding.  Good voice with Passy-Muir valve in place.          Assessment & Plan:  Tracheostomy status, obstructive sleep apnea, stomal cellulitis, and tracheal bleeding  The trachea was evaluated by fiberoptic scope.  See procedure note.  There is an area of granulation of the anterior tracheal wall at the level of the tip of her trach tube.  This is the source of bleeding and comes from rubbing by the trach tube.  I changed her trach tube to a fenestrated #6 Shiley with fenestrated inner cannula.  I will order a Montgomery tube for change in the office as an outpatient.  I recommend a course of clindamycin to treat stomal cellulitis.  I will arrange follow-up when her new trach tube is available.

## 2016-06-23 NOTE — Progress Notes (Signed)
Pt seen and examined, admitted this am by Dr.Kakrakandy  Yesenia [name] is a 55 y.o. female with history of diabetes mellitus type 2, hypertension, Obesity, sleep apnea on tracheostomy presented to the ER because of bleeding from the trach site. Patient states she noticed some lump behind the trach site one week ago and started coughing up blood last 4 days. No fevers or chills, CXR clear. On-call ENT surgeon Dr. Jenne PaneBates was consulted by the EDP and Dr. Jenne PaneBates has requested observation. Await input from ENT, Bleeding seems to have stopped, Hb stable  [name], MD

## 2016-06-23 NOTE — Procedures (Signed)
Preop diagnosis: Tracheostomy status, obstructive sleep apnea, tracheal bleeding Postop diagnosis: same Procedure: Fiberoptic tracheoscopy Surgeon: Jenne PaneBates Anesth: None Comp: None Findings: The tracheostomy stoma has some granulation but no sign of bleeding and measures 3.5 cm.  The anterior tracheal wall at the level of the tip of the tracheostomy tube has an area of granulation tissue.  There is some mild purulent secretions in the distal trachea.  Procedure: After discussing risks, benefits, and alternatives, the patient's indwelling tracheostomy tube was removed and the fiberoptic laryngoscope was passed through the existing stoma and down the trachea.  Findings are noted above.  The #6 fenestrated Shiley trach tube was then placed without difficulty.  She was returned to nursing care in stable condition.

## 2016-06-23 NOTE — Progress Notes (Signed)
CRITICAL VALUE ALERT  Critical value received:  MRSA PCR positive Date of notification:  06/23/16 Time of notification: 8:16 am  Critical value read back:Yes.    Nurse who received alert:  Dionisio DavidJay Sony Schlarb, RN  MD notified (1st page):  Zannie CovePreetha Joseph Time of first page: 8:20 am  Responding MD:  Notified Dr. Zannie CovePreetha Joseph

## 2016-06-24 ENCOUNTER — Other Ambulatory Visit: Payer: Medicaid Other

## 2016-06-24 DIAGNOSIS — I10 Essential (primary) hypertension: Secondary | ICD-10-CM

## 2016-06-24 DIAGNOSIS — N183 Chronic kidney disease, stage 3 (moderate): Secondary | ICD-10-CM

## 2016-06-24 LAB — GLUCOSE, CAPILLARY
Glucose-Capillary: 91 mg/dL (ref 65–99)
Glucose-Capillary: 90 mg/dL (ref 65–99)
Glucose-Capillary: 77 mg/dL (ref 65–99)

## 2016-06-24 LAB — CBC
HCT: 33.2 % — ABNORMAL LOW (ref 36.0–46.0)
Hemoglobin: 10.7 g/dL — ABNORMAL LOW (ref 12.0–15.0)
MCH: 27.8 pg (ref 26.0–34.0)
MCHC: 32.2 g/dL (ref 30.0–36.0)
MCV: 86.2 fL (ref 78.0–100.0)
PLATELETS: 285 10*3/uL (ref 150–400)
RBC: 3.85 MIL/uL — ABNORMAL LOW (ref 3.87–5.11)
RDW: 13.6 % (ref 11.5–15.5)
WBC: 8.4 10*3/uL (ref 4.0–10.5)

## 2016-06-24 MED ORDER — DIPHENHYDRAMINE-ZINC ACETATE 2-0.1 % EX CREA
TOPICAL_CREAM | Freq: Two times a day (BID) | CUTANEOUS | Status: DC | PRN
  Filled 2016-06-24: qty 28

## 2016-06-24 MED ORDER — CLINDAMYCIN HCL 150 MG PO CAPS: 150.0000 mg | ORAL_CAPSULE | Freq: Three times a day (TID) | ORAL | Status: DC

## 2016-06-24 MED ORDER — DIPHENHYDRAMINE-ZINC ACETATE 2-0.1 % EX CREA: TOPICAL_CREAM | Freq: Two times a day (BID) | CUTANEOUS | 0 refills | Status: DC | PRN

## 2016-06-24 NOTE — Progress Notes (Signed)
Report called to Bonita QuinLinda, [facility] fundraiserN at [facility].

## 2016-06-24 NOTE — Discharge Summary (Addendum)
Physician Discharge Summary  Yesenia StageSonja Auer ZOX:[id number] DOB: 02-14-1961 DOA: 06/22/2016  PCP: Reather ConverseMASSENBURG,O'LAF, PA-C  Admit date: 06/22/2016 Discharge date: 06/24/2016  Time spent: 45 minutes  Recommendations for Outpatient Follow-up:  1. ENT Dr.Bates in 2-3weeks, he will order a [place] tube for change in office as outpatient 2. PCP in 1 week, please resume BP meds slowly as tolerated  Discharge Diagnoses:  Principal Problem:   Complication of tracheostomy (HCC)   Bleeding from tracheostomy site   Essential hypertension   Type 2 diabetes mellitus with stage 3 chronic kidney disease, without long-term current use of insulin (HCC)   Sleep apnea   CKD (chronic kidney disease) stage 3, GFR 30-59 ml/min   Bleeding   Discharge Condition: stable  Diet recommendation: DM/heart healthy  Filed Weights   06/23/16 0256 06/23/16 2050  Weight: (!) 152 kg (335 lb 1.6 oz) (!) 153.2 kg (337 lb 11.9 oz)    History of present illness:  Yesenia [name] is a 55 y.o. female with history of diabetes mellitus type 2, hypertension, sleep apnea on tracheostomy presents to the ER because of bleeding from the trach site. Patient states she noticed some lump behind the trach site one week ago and started coughing up blood last 4 days. Denies any fever chills shortness of breath. Chest x-ray does not show nothing acute. On-call ENT surgeon was consulted  Hospital Course:   Bleeding from tracheostomy site  -resolved - Dr. Jenne PaneBates on-call ENT surgeon was consulted -he did a fibreoptic tracheoscopy.  There is an area of granulation of the anterior tracheal wall at the level of the tip of her trach tube which was the source of bleeding and comes from rubbing by the trach tube.  Dr.Bates changed her trach tube to a fenestrated #6 Shiley with fenestrated inner cannula.  He will order a Montgomery tube for change in the office as an outpatient.  He also recommend a course of clindamycin to treat stomal  cellulitis.  Dr.Bates will arrange follow-up when her new trach tube is available.  History of hypertension presently blood pressure is in the low-normal -hence we held her HCTZ and norvasc -can be gradually resumed one by one at SNF as BP tolerates  Diabetes mellitus type 2 -resume GLucotrol, stable  Chronic anemia -stable at baseline  Sleep apnea on trach. -see above  Procedures: 12/13 Procedure: Fiberoptic tracheoscopy  Consultations:  ENT Dr.Bates  Discharge Exam: Vitals:   06/24/16 1050 06/24/16 1150  BP: 108/64   Pulse: 75 76  Resp: 18 18  Temp: 98.4 F (36.9 C)     General: AAOx3 Cardiovascular: S1S2/RRR Respiratory: CTAB  Discharge Instructions   Discharge Instructions    Diet - low sodium heart healthy    Complete by:  As directed    Increase activity slowly    Complete by:  As directed      Current Discharge Medication List    START taking these medications   Details  clindamycin (CLEOCIN) 150 MG capsule Take 1 capsule (150 mg total) by mouth 3 (three) times daily. For 3days    diphenhydrAMINE-zinc acetate (BENADRYL) cream Apply topically 2 (two) times daily as needed for itching. For 2-3days Qty: 28.4 g, Refills: 0      CONTINUE these medications which have NOT CHANGED   Details  atenolol (TENORMIN) 50 MG tablet Take 50 mg by mouth daily. Refills: 3    busPIRone (BUSPAR) 15 MG tablet Take 15 mg by mouth 2 (two) times daily.    cholestyramine (  QUESTRAN) 4 g packet Take 4 g by mouth daily.    clonazePAM (KLONOPIN) 1 MG tablet Take 1 mg by mouth at bedtime.    escitalopram (LEXAPRO) 10 MG tablet Take 10 mg by mouth daily.     ferrous sulfate 325 (65 FE) MG tablet Take 325 mg by mouth daily. Refills: 6    gabapentin (NEURONTIN) 300 MG capsule Take 300 mg by mouth at bedtime.    glipiZIDE (GLUCOTROL XL) 2.5 MG 24 hr tablet Take 1 tablet (2.5 mg total) by mouth daily with breakfast. Qty: 30 tablet, Refills: 0    loperamide (IMODIUM) 2  MG capsule Take 2 mg by mouth 3 (three) times daily before meals.    ondansetron (ZOFRAN) 4 MG tablet Take 4 mg by mouth daily. May also give one tablet every 8 hours prn    saccharomyces boulardii (FLORASTOR) 250 MG capsule Take 250 mg by mouth 2 (two) times daily.    traMADol (ULTRAM) 50 MG tablet Take 50 mg by mouth every 6 (six) hours as needed for moderate pain or severe pain.     traZODone (DESYREL) 150 MG tablet Take 150 mg by mouth at bedtime.    albuterol (PROVENTIL HFA;VENTOLIN HFA) 108 (90 BASE) MCG/ACT inhaler Inhale 2 puffs into the lungs every 6 (six) hours as needed for wheezing.    hyoscyamine (ANASPAZ) 0.125 MG TBDP disintergrating tablet Place 0.125 mg under the tongue 3 (three) times daily as needed. For Gas-esophageal  Reflux    nystatin (NYSTATIN) powder Apply topically daily.      STOP taking these medications     amLODipine (NORVASC) 10 MG tablet      hydrochlorothiazide (HYDRODIURIL) 25 MG tablet        Allergies  Allergen Reactions  . Buprenorphine Hcl Hives  . Morphine And Related Hives and Dermatitis   Follow-up Information    MASSENBURG,O'LAF, PA-C. Schedule an appointment as soon as possible for a visit in 1 week(s).   Specialty:  Physician Assistant Contact information: [address] [place] [address] [phone]            The results of significant diagnostics from this hospitalization (including imaging, microbiology, ancillary and laboratory) are listed below for reference.    Significant Diagnostic Studies: Dg Chest 2 View  Result Date: 06/22/2016 CLINICAL DATA:  Cough and chest pain EXAM: CHEST  2 VIEW COMPARISON:  Chest radiograph 05/11/2016 FINDINGS: Unchanged position of tracheostomy tube with tip at the level of the clavicular heads. Cardiomediastinal silhouette remains enlarged, unchanged. No focal airspace consolidation or pulmonary edema. No pneumothorax or pleural effusion. Bibasilar atelectasis. IMPRESSION:  Unchanged cardiomegaly with bibasilar atelectasis. Electronically Signed   By: Deatra RobinsonKevin  Herman M.D.   On: 06/22/2016 22:18    Microbiology: Recent Results (from the past 240 hour(s))  MRSA PCR Screening     Status: Abnormal   Collection Time: 06/23/16  3:18 AM  Result Value Ref Range Status   MRSA by PCR POSITIVE (A) NEGATIVE Final    Comment:        The GeneXpert MRSA Assay (FDA approved for NASAL specimens only), is one component of a comprehensive MRSA colonization surveillance program. It is not intended to diagnose MRSA infection nor to guide or monitor treatment for MRSA infections. RESULT CALLED TO, READ BACK BY AND VERIFIED WITH: J. NARANDAS [id number] 12.13.2017 N. MORRIS      Labs: Basic Metabolic Panel:  Recent Labs Lab 06/22/16 2018 06/23/16 0318  NA 140 141  K 3.2*  3.3*  CL 105 105  CO2 29 28  GLUCOSE 112* 91  BUN 20 14  CREATININE 1.28* 1.17*  CALCIUM 8.7* 9.0   Liver Function Tests:  Recent Labs Lab 06/22/16 2018  AST 17  ALT 20  ALKPHOS 64  BILITOT 0.5  PROT 6.9  ALBUMIN 3.5   No results for input(s): LIPASE, AMYLASE in the last 168 hours. No results for input(s): AMMONIA in the last 168 hours. CBC:  Recent Labs Lab 06/22/16 2018 06/23/16 0318 06/24/16 0353  WBC 8.9 9.0 8.4  NEUTROABS 4.5  --   --   HGB 11.3* 11.2* 10.7*  HCT 34.7* 34.4* 33.2*  MCV 86.3 85.1 86.2  PLT 310 300 285   Cardiac Enzymes: No results for input(s): CKTOTAL, CKMB, CKMBINDEX, TROPONINI in the last 168 hours. BNP: BNP (last 3 results)  Recent Labs  04/12/16 2250  BNP 69.0    ProBNP (last 3 results) No results for input(s): PROBNP in the last 8760 hours.  CBG:  Recent Labs Lab 06/23/16 1147 06/23/16 1648 06/23/16 2048 06/24/16 0733 06/24/16 1219  GLUCAP 92 89 77 91 90       Signed:  Hajra Port MD.  [facility] 06/24/2016, 2:21 PM

## 2016-06-24 NOTE — Progress Notes (Signed)
Yesenia StageSonja Granato to be D/C'd Skilled nursing facility per MD order.  Discussed prescriptions and follow up appointments with the patient. Prescriptions given to patient, medication list explained in detail. Pt verbalized understanding.  Allergies as of 06/24/2016      Reactions   Buprenorphine Hcl Hives   Morphine And Related Hives, Dermatitis      Medication List    STOP taking these medications   amLODipine 10 MG tablet Commonly known as:  NORVASC   hydrochlorothiazide 25 MG tablet Commonly known as:  HYDRODIURIL     TAKE these medications   albuterol 108 (90 Base) MCG/ACT inhaler Commonly known as:  PROVENTIL HFA;VENTOLIN HFA Inhale 2 puffs into the lungs every 6 (six) hours as needed for wheezing.   atenolol 50 MG tablet Commonly known as:  TENORMIN Take 50 mg by mouth daily.   busPIRone 15 MG tablet Commonly known as:  BUSPAR Take 15 mg by mouth 2 (two) times daily.   cholestyramine 4 g packet Commonly known as:  QUESTRAN Take 4 g by mouth daily.   clindamycin 150 MG capsule Commonly known as:  CLEOCIN Take 1 capsule (150 mg total) by mouth 3 (three) times daily. For 3days   clonazePAM 1 MG tablet Commonly known as:  KLONOPIN Take 1 mg by mouth at bedtime. What changed:  Another medication with the same name was removed. Continue taking this medication, and follow the directions you see here.   diphenhydrAMINE-zinc acetate cream Commonly known as:  BENADRYL Apply topically 2 (two) times daily as needed for itching. For 2-3days   escitalopram 10 MG tablet Commonly known as:  LEXAPRO Take 10 mg by mouth daily.   ferrous sulfate 325 (65 FE) MG tablet Take 325 mg by mouth daily.   gabapentin 300 MG capsule Commonly known as:  NEURONTIN Take 300 mg by mouth at bedtime.   glipiZIDE 2.5 MG 24 hr tablet Commonly known as:  GLUCOTROL XL Take 1 tablet (2.5 mg total) by mouth daily with breakfast.   hyoscyamine 0.125 MG Tbdp disintergrating tablet Commonly known  as:  ANASPAZ Place 0.125 mg under the tongue 3 (three) times daily as needed. For Gas-esophageal  Reflux   loperamide 2 MG capsule Commonly known as:  IMODIUM Take 2 mg by mouth 3 (three) times daily before meals.   nystatin powder Generic drug:  nystatin Apply topically daily.   ondansetron 4 MG tablet Commonly known as:  ZOFRAN Take 4 mg by mouth daily. May also give one tablet every 8 hours prn   saccharomyces boulardii 250 MG capsule Commonly known as:  FLORASTOR Take 250 mg by mouth 2 (two) times daily.   traMADol 50 MG tablet Commonly known as:  ULTRAM Take 50 mg by mouth every 6 (six) hours as needed for moderate pain or severe pain.   traZODone 150 MG tablet Commonly known as:  DESYREL Take 150 mg by mouth at bedtime.       Vitals:   06/24/16 1627 06/24/16 1840  BP:  (!) 115/49  Pulse: 84 80  Resp: 18 20  Temp:  98.5 F (36.9 C)    Skin clean, dry and intact without evidence of skin break down, no evidence of skin tears noted. IV catheter discontinued intact. Site without signs and symptoms of complications. Dressing and pressure applied. Pt denies pain at this time. No complaints noted.   Patient escorted via stretcher, and D/C to SNF  via [facility].  Britt BologneseAnisha Mabe RN, BSN

## 2016-06-24 NOTE — Clinical Social Work Note (Signed)
Clinical Social Work Assessment  Patient Details  Name: Yesenia StageSonja Binstock MRN: [id number] Date of Birth: 1960-09-22  Date of referral:  06/24/16               Reason for consult:  Facility Placement (Patient from [facility])                Permission sought to share information with:  Family Supports Permission granted to share information::  No  Name::     Freeman CaldronCatrina Howell (Patient asleep and could not be awakened earlier today. Daughter called and informed of d/c.)  Agency::     Relationship::  Daughter  Contact Information:  [phone]  Housing/Transportation Living arrangements for the past 2 months:  Skilled Nursing Facility Chiropractor([facility]) Source of Information:  Other (Comment Required) (Tammy Emelia LoronBlakely with [facility] facilities and chart) Patient Interpreter Needed:  None Criminal Activity/Legal Involvement Pertinent to Current Situation/Hospitalization:  No - Comment as needed Significant Relationships:  Adult Children Lives with:  Facility Resident ([facility]) Do you feel safe going back to the place where you live?  Yes Need for family participation in patient care:  Yes (Comment)  Care giving concerns: Daughter/patient expressed no concerns regarding care at nursing facility.   Social Worker assessment / plan:  CSW contacted patient's daughter Freeman CaldronCatrina Howell and informed her of patient's discharge [facility] on [facility].  Employment status:  Disabled (Comment on whether or not currently receiving Disability) Insurance information:  [place] Recommend[place]:  Not assessed at this time Information / Referral to community resources:  Other (Comment Required) ( Not needed as patient from facility)  Patient/Family's Response to care: No concerns expressed by patient or daughter of care during hospitalization. Daughter advised to call front desk and ask to speak with patient's nurse to get update.  Patient/Family's Understanding of and Emotional Response to Diagnosis,  Current Treatment, and Prognosis: Not discussed.  Emotional Assessment Appearance:  Appears stated age Attitude/Demeanor/Rapport:  Other (Appropriate) Affect (typically observed):  Appropriate Orientation:  Oriented to Self, Oriented to Place, Oriented to  Time, Oriented to Situation Alcohol / Substance use:  Never Used Psych involvement (Current and /or in the community):  No (Comment)  Discharge Needs  Concerns to be addressed:  Discharge Planning Concerns Readmission within the last 30 days:  No Current discharge risk:  None Barriers to Discharge:  No Barriers Identified   Cristobal GoldmannCrawford, Tanicia Wolaver Bradley, LCSW 06/24/2016, 4:23 PM

## 2016-06-24 NOTE — NC FL2 (Signed)
[place] MEDICAID FL2 LEVEL OF CARE SCREENING TOOL     IDENTIFICATION  Patient Name: Yesenia [name] Birthdate: 07-31-1960 Sex: female Admission Date (Current Location): 06/22/2016  [facility] [place]   Facility and Address:  [facility], [address], [place], [place] [address]      Provider Number: [id number]  Attending Physician Name and Address:  Zannie CovePreetha Joseph, MD  Relative Name and Phone Number:       Current Level of Care: Hospital Recommended Level of Care: Skilled Nursing Facility Prior Approval Number:    Date Approved/Denied:   PASRR Number: [id number]  Discharge Plan:      Current Diagnoses: Patient Active Problem List   Diagnosis Date Noted  . Complication of tracheostomy (HCC) 06/23/2016  . Bleeding 06/23/2016  . Iron deficiency anemia 05/26/2016  . 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 Irritable bowel syndrome 05/21/2016  . Anxiety state 05/21/2016  . Tracheostomy dependent (HCC)   . Generalized weakness 04/13/2016  . Weakness generalized 04/13/2016  . HCAP (healthcare-associated pneumonia) 03/06/2016  . Fibromyalgia 02/22/2016  . Cellulitis 02/21/2016  . Abdominal pain 02/16/2016  . Constipation 02/16/2016  . CKD (chronic kidney disease) stage 3, GFR 30-59 ml/min 02/16/2016  . Fever   . Cough   . Sleep apnea 02/13/2016  . Insomnia 08/10/2015  . Polyneuropathy (HCC) 07/10/2015  . GERD (gastroesophageal reflux disease) 07/10/2015  . Acute kidney injury superimposed on chronic kidney disease (HCC) 07/07/2015  . Gait disturbance 07/07/2015  . Morbid obesity due to excess calories (HCC) 07/07/2015  . Left hip pain 07/06/2015  . Chronic pain   . Right sided weakness   . Headache(784.0) 04/14/2013  . Encephalopathy acute 04/11/2013  . Acute pyelonephritis 04/11/2013  . Type 2 diabetes mellitus with stage 3 chronic kidney disease, without long-term current use of insulin (HCC) 04/11/2013  .  Dyslipidemia 04/11/2013  . Chest pain 02/15/2013  . Cerebral infarction (HCC) 01/14/2013  . Essential hypertension 01/14/2013  . Anemia 01/14/2013    Orientation RESPIRATION BLADDER Height & Weight     Time, Situation, Place, Self  Normal Continent Weight: (!) 337 lb 11.9 oz (153.2 kg) Height:  5\' 7"  (170.2 cm)  BEHAVIORAL SYMPTOMS/MOOD NEUROLOGICAL BOWEL NUTRITION STATUS      Continent Diet (Heart Healthy/Carb Modified, Thin Liquids)  AMBULATORY STATUS COMMUNICATION OF NEEDS Skin   Extensive Assist Verbally Normal                       Personal Care Assistance Level of Assistance  Bathing, Dressing, Feeding Bathing Assistance: Maximum assistance Feeding assistance: Independent Dressing Assistance: Maximum assistance     Functional Limitations Info  Sight, Hearing, Speech Sight Info: Adequate Hearing Info: Adequate Speech Info: Adequate    SPECIAL CARE FACTORS FREQUENCY                       Contractures Contractures Info: Not present    Additional Factors Info  Code Status, Allergies, Psychotropic, Insulin Sliding Scale, Isolation Precautions Code Status Info: Full Code Allergies Info: Buprenorphine Hcl, Morphine And Related Psychotropic Info: Lexapro, Buspar Insulin Sliding Scale Info: 3x/day Isolation Precautions Info: Contact precautions     Current Medications (06/24/2016):  This is the current hospital active medication list Current Facility-Administered Medications  Medication Dose Route Frequency Provider Last Rate Last Dose  . acetaminophen (TYLENOL) tablet 650 mg  650 mg Oral Q6H PRN Eduard ClosArshad N Kakrakandy, MD  Or  . acetaminophen (TYLENOL) suppository 650 mg  650 mg Rectal Q6H PRN Eduard ClosArshad N Kakrakandy, MD      . albuterol (PROVENTIL) (2.5 MG/3ML) 0.083% nebulizer solution 2.5 mg  2.5 mg Inhalation Q6H PRN Eduard ClosArshad N Kakrakandy, MD      . busPIRone (BUSPAR) tablet 15 mg  15 mg Oral BID Eduard ClosArshad N Kakrakandy, MD   15 mg at 06/24/16 1046  .  Chlorhexidine Gluconate Cloth 2 % PADS 6 each  6 each Topical Q0600 Zannie CovePreetha Joseph, MD      . cholestyramine Lanetta Inch(QUESTRAN) packet 4 g  4 g Oral Q24H Eduard ClosArshad N Kakrakandy, MD   4 g at 06/24/16 1044  . clonazePAM (KLONOPIN) tablet 1 mg  1 mg Oral QHS Eduard ClosArshad N Kakrakandy, MD   1 mg at 06/23/16 2353  . escitalopram (LEXAPRO) tablet 10 mg  10 mg Oral Daily Eduard ClosArshad N Kakrakandy, MD   10 mg at 06/24/16 1046  . ferrous sulfate tablet 325 mg  325 mg Oral Daily Eduard ClosArshad N Kakrakandy, MD   325 mg at 06/24/16 1045  . gabapentin (NEURONTIN) capsule 300 mg  300 mg Oral QHS Eduard ClosArshad N Kakrakandy, MD   300 mg at 06/23/16 2355  . glipiZIDE (GLUCOTROL XL) 24 hr tablet 2.5 mg  2.5 mg Oral Q breakfast Eduard ClosArshad N Kakrakandy, MD   2.5 mg at 06/24/16 0843  . hyoscyamine (ANASPAZ) disintergrating tablet 0.125 mg  0.125 mg Sublingual TID PRN Eduard ClosArshad N Kakrakandy, MD      . insulin aspart (novoLOG) injection 0-9 Units  0-9 Units Subcutaneous TID WC Eduard ClosArshad N Kakrakandy, MD      . loperamide (IMODIUM) capsule 2 mg  2 mg Oral TID [facility] Zannie CovePreetha Joseph, MD   2 mg at 06/24/16 1046  . MEDLINE mouth rinse  15 mL Mouth Rinse q12n4p Eduard ClosArshad N Kakrakandy, MD   15 mL at 06/23/16 1600  . mupirocin ointment (BACTROBAN) 2 % 1 application  1 application Nasal BID Zannie CovePreetha Joseph, MD   1 application at 06/24/16 1048  . ondansetron (ZOFRAN) tablet 4 mg  4 mg Oral Q6H PRN Eduard ClosArshad N Kakrakandy, MD       Or  . ondansetron [facility](ZOFRAN) injection 4 mg  4 mg Intravenous Q6H PRN Eduard ClosArshad N Kakrakandy, MD      . ondansetron [facility](ZOFRAN) tablet 4 mg  4 mg Oral Daily Eduard ClosArshad N Kakrakandy, MD   4 mg at 06/24/16 1046  . saccharomyces boulardii (FLORASTOR) capsule 250 mg  250 mg Oral BID Eduard ClosArshad N Kakrakandy, MD   250 mg at 06/24/16 1046  . traMADol (ULTRAM) tablet 50 mg  50 mg Oral Q6H PRN Eduard ClosArshad N Kakrakandy, MD   50 mg at 06/23/16 2355  . traZODone (DESYREL) tablet 150 mg  150 mg Oral QHS Eduard ClosArshad N Kakrakandy, MD   150 mg at 06/23/16 2354     Discharge Medications: Please see  discharge summary for a list of discharge medications.  Relevant Imaging Results:  Relevant Lab Results:   Additional Information SSN:  [id number]  Dede QuerySarah Jermika Olden, LCSW

## 2016-06-24 NOTE — Clinical Social Work Note (Signed)
Ms. Yesenia [name] will discharge back [facility] today, transported by ambulance. Reyne Dumasammy Blakely with [facility] contacted and informed and discharge clinicals transmitted to facility. Daughter Freeman CaldronCatrina Howell [phone]) also contacted and informed of discharge.  Genelle BalVanessa Shaleen Talamantez, MSW, LCSW Licensed Clinical Social Worker Clinical Social Work Department [facility] [phone]

## 2016-06-25 ENCOUNTER — Encounter: Payer: Self-pay | Admitting: Adult Health

## 2016-06-25 ENCOUNTER — Non-Acute Institutional Stay (SKILLED_NURSING_FACILITY): Payer: Medicaid Other | Admitting: Adult Health

## 2016-06-25 DIAGNOSIS — M797 Fibromyalgia: Secondary | ICD-10-CM | POA: Diagnosis not present

## 2016-06-25 DIAGNOSIS — L03221 Cellulitis of neck: Secondary | ICD-10-CM | POA: Diagnosis not present

## 2016-06-25 DIAGNOSIS — F411 Generalized anxiety disorder: Secondary | ICD-10-CM | POA: Diagnosis not present

## 2016-06-25 DIAGNOSIS — I639 Cerebral infarction, unspecified: Secondary | ICD-10-CM | POA: Diagnosis not present

## 2016-06-25 DIAGNOSIS — I10 Essential (primary) hypertension: Secondary | ICD-10-CM | POA: Diagnosis not present

## 2016-06-25 DIAGNOSIS — N183 Chronic kidney disease, stage 3 unspecified: Secondary | ICD-10-CM

## 2016-06-25 DIAGNOSIS — K580 Irritable bowel syndrome with diarrhea: Secondary | ICD-10-CM

## 2016-06-25 DIAGNOSIS — E1122 Type 2 diabetes mellitus with diabetic chronic kidney disease: Secondary | ICD-10-CM

## 2016-06-25 DIAGNOSIS — G4733 Obstructive sleep apnea (adult) (pediatric): Secondary | ICD-10-CM | POA: Diagnosis not present

## 2016-06-25 NOTE — Progress Notes (Signed)
Patient ID: Yesenia [name], female   DOB: 1960-07-13, 55 y.o.   MRN: [id number]   Location:   [facility] Room Number: 203-A Place of Service:  SNF (31)   CODE STATUS: Full Code  Allergies  Allergen Reactions  . Buprenorphine Hcl Hives  . Morphine And Related Hives and Dermatitis    Chief Complaint  Patient presents with  . Hospitalization Follow-up    Hospital Follow up    HPI:  She has been hospitalized due to bleeding from her tracheostomy. She did have fiberoptic tracheoscope found an area of granulation of the anterior tracheal wall at the level of the tip of her trach which was the source of her bleeding.  She was treated trach stoma cellulitis. She continues to have pain her trach site and tells me that the ultram is not effective in relieving her pain. 'She is here for short term rehab; her goal remains to go home as soon as she is able.   Past Medical History:  Diagnosis Date  . Anemia   . Anemia   . Chronic pain   . CKD (chronic kidney disease)   . Diabetes mellitus without complication (HCC)    diet controlled. does not check CBG's  . Fibromyalgia    hospitilized 12/16 due to inability to walk  . Hyperlipemia   . Hypertension   . Insomnia   . Neuropathy (HCC)   . Right sided weakness   . Stroke [facility](HCC) 2014?   no residual weakness   . Vitamin deficiency    Vit D    Past Surgical History:  Procedure Laterality Date  . KNEE SURGERY  1992, 1995, 1997  . LEG SURGERY Right 2007   Surgery x3 2 arthroscopies and one rod  . OOPHORECTOMY Right 1993?  . TRACHEOSTOMY TUBE PLACEMENT N/A 02/13/2016   Procedure: TRACHEOSTOMY;  Surgeon: Christia Readingwight Bates, MD;  [facility];  Service: ENT;  Laterality: N/A;    Social History   Social History  . Marital status: Single    Spouse name: N/A  . Number of children: 2  . Years of education: 12th    Occupational History  . SSI    Social History Main Topics  . Smoking status: Never Smoker  . Smokeless tobacco:  Never Used  . Alcohol use No  . Drug use: No  . Sexual activity: No   Other Topics Concern  . Not on file   Social History Narrative   Reports no caffeine use    Family History  Problem Relation Age of Onset  . Breast cancer Mother   . Leukemia Brother   . Breast cancer Maternal Grandmother       VITAL SIGNS BP (!) 111/58   Pulse 65   Temp 97.4 F (36.3 C) (Oral)   Resp 18   Ht 5\' 7"  (1.702 m)   Wt (!) 331 lb (150.1 kg)   SpO2 98%   BMI 51.84 kg/m   Patient's Medications  New Prescriptions   No medications on file  Previous Medications   ALBUTEROL (PROVENTIL HFA;VENTOLIN HFA) 108 (90 BASE) MCG/ACT INHALER    Inhale 2 puffs into the lungs every 6 (six) hours as needed for wheezing.   ATENOLOL (TENORMIN) 50 MG TABLET    Take 50 mg by mouth daily.   BUSPIRONE (BUSPAR) 15 MG TABLET    Take 15 mg by mouth 2 (two) times daily.   CHOLESTYRAMINE (QUESTRAN) 4 G PACKET    Take 4 g by  mouth daily.   CLINDAMYCIN (CLEOCIN) 150 MG CAPSULE    Take 1 capsule (150 mg total) by mouth 3 (three) times daily. For 3days   CLONAZEPAM (KLONOPIN) 1 MG TABLET    Take 1 mg by mouth at bedtime.   DIPHENHYDRAMINE-ZINC ACETATE (BENADRYL) CREAM    Apply topically 2 (two) times daily as needed for itching. For 2-3days   ESCITALOPRAM (LEXAPRO) 10 MG TABLET    Take 10 mg by mouth daily.    FERROUS SULFATE 325 (65 FE) MG TABLET    Take 325 mg by mouth daily.   GABAPENTIN (NEURONTIN) 300 MG CAPSULE    Take 300 mg by mouth at bedtime.   GLIPIZIDE (GLUCOTROL XL) 2.5 MG 24 HR TABLET    Take 1 tablet (2.5 mg total) by mouth daily with breakfast.   HYOSCYAMINE (ANASPAZ) 0.125 MG TBDP DISINTERGRATING TABLET    Place 0.125 mg under the tongue 3 (three) times daily as needed. For Gas-esophageal  Reflux   LOPERAMIDE (IMODIUM) 2 MG CAPSULE    Take 2 mg by mouth 3 (three) times daily before meals.   NYSTATIN (NYSTATIN) POWDER    Apply topically daily.   ONDANSETRON (ZOFRAN) 4 MG TABLET    Take 4 mg by mouth  daily. May also give one tablet every 8 hours prn   SACCHAROMYCES BOULARDII (FLORASTOR) 250 MG CAPSULE    Take 250 mg by mouth 2 (two) times daily.   TRAMADOL (ULTRAM) 50 MG TABLET    Take 50 mg by mouth every 6 (six) hours as needed for moderate pain or severe pain.    TRAZODONE (DESYREL) 150 MG TABLET    Take 150 mg by mouth at bedtime.  Modified Medications   No medications on file  Discontinued Medications   No medications on file     SIGNIFICANT DIAGNOSTIC EXAMS  07-06-15: ct scan of  left hip: No evidence of fracture or dislocation. No significant joint effusion. No definite findings seen to suggest septic arthritis at this time  07-17-15: left elbow x-ray: 1. No evidence of acute fracture or dislocation. 2. Several small osseous fragments overlying the medial epicondyle, likely reflecting remote injury.  03-06-16: chest x-ray: Low volumes with some increase in bibasilar atelectasis or infiltrate.  03-07-16: chest x-ray: No acute cardiopulmonary findings.  04-12-16: chest x-ray: Tracheostomy. Shallow inspiration. Mild cardiac enlargement without vascular congestion. No focal airspace disease or consolidation. No blunting of costophrenic angles. No pneumothorax. Tortuous aorta.  04-13-16: pelvic x-ray: There is no evidence of pelvic fracture or diastasis. No pelvic bone lesions are seen.  04-13-16: mri brain: 1. No acute intracranial abnormality. 2. Moderate cerebral white matter disease, unchanged and nonspecific though may reflect age advanced chronic small vessel ischemia.  04-13-16: mri of lumbar spine: 1. At L4-5 there is a mild broad-based disc bulge. Moderate left and mild right facet arthropathy. 2. At L5-S1 there is a broad-based disc bulge. Moderate bilateral facet arthropathy.  05-11-16: chest x-ray: Minimal bibasilar atelectasis noted. Lungs otherwise grossly clear. Mild cardiomegaly.   06-10-16: right knee x-ray: marked osteoarthritis:   06-22-16: chest x-ray: Unchanged  cardiomegaly with bibasilar atelectasis.      LABS REVIEWED:   07-07-15: wbc 9.2; hgb 9.8; hct 30.7; mcv 89.5; plt 260 glucose 123; bun 17; creat 1.29; k+ 3.3; na++142; vitamin B12: 552; folate 9.2; iron 71; TIBC 253 07-09-15: wbc 7.7; hgb 9.7; hct 30.6; mcv 88.7; ;plt 290; glucose 118; bun 14; creat 0.99; k+ 3.6; na++142 07-17-15: wbc 10.0; hgb 11.3; hct  33.9; mcv 88.1; plt 318; glucose 112; bun 16; creat 1.13; k+ 3.7; na++142; sed rate 60; CRP 3.1  02-05-16: hgb a1c 6.1 03-06-16: wbc 7.1;hb 11.8; hct 36.8;mcv 88.0; plt 396; glucose 110; bun 16; creat 1.26; k+ 3.1; na++ 140; urine culture: 40,000 enterococcus 03-11-16: wbc 7.4; hgb 9.4; hct 30.2; mcv 88.3; plt 294; glucose 106; bun 13; creat 1.11; k+ 3.9; na++ 141; liver normal albumin 2.9  04-12-16: wbc 7.9; hgb 10.8; hct 35.5; mcv 90.8; plt 319; glucose 105; bun 11; creat 1.18; k+ 3.7; na++ 143; liver normal albumin 3.2; BNP 69; blood culture: no growth 04-13-16: tsh 0.720; urine culture: multiple species 04-14-16: wbc 7.2; hgb 10.4; hct 33.8; mcv 90.6; plt 283; glucose 86; bun 12; creat 0.96; k+ 3.8; na++ 142  05-12-16: wbc 8.3; hgb 11.4; hct 35.9; mcv 88.2; plt 309; glucose 90; bun 17; creat 1.23; k+ 3.6; na++ 143; liver normal albumin 3.5; urine culture: no growth 06-22-16: wbc 8.9 hgb 11.3; hct 34.7 ;mcv 86.3; plt 310; glucose 112; bun 20 ;creat 1.28; k+ 3.2; na++ 140; liver normal albumin 3.5 06-24-16: wbc 8.4; hgb 10.7; hct 33.2; mcv 86.2; plt 285    Review of Systems  Constitutional: Negative for malaise/fatigue.  Respiratory: Positive for cough. Negative for shortness of breath.        Has trach  Has occasional cough   Cardiovascular: Negative for chest pain, palpitations and leg swelling.  Gastrointestinal: Negative for abdominal pain, constipation, heartburn and nausea.  Musculoskeletal: Negative for back pain, joint pain and myalgias. has trach site pain  Skin: Negative.   Neurological: Negative for dizziness.    Psychiatric/Behavioral: The patient is not nervous/anxious.       Physical Exam  Constitutional: She is oriented to person, place, and time. She appears well-developed and well-nourished. No distress.  Morbidly obese   Eyes: Conjunctivae are normal.  Neck: Neck supple. No JVD present. No thyromegaly present.  Has trach  Cardiovascular: Normal rate, regular rhythm and intact distal pulses.   Respiratory: Effort normal and breath sounds normal. No respiratory distress. She has no wheezes.  GI: Soft. Bowel sounds are normal. She exhibits no distension. There is no tenderness.  Musculoskeletal:  Able to move all extremities    Lymphadenopathy:    She has no cervical adenopathy.  Neurological: She is alert and oriented to person, place, and time.  Skin: Skin is warm and dry. She is not diaphoretic.  Psychiatric: She has a normal mood and affect.     ASSESSMENT/PLAN  1. Hypertension: will continue atenolol 50 mg daily   2. OSA: is status post trach placement. Is followed by the trach clinic. Dose have pain at her trach site will begin tylenol #3 every 6 hours as needed for pain and will monitor   3. IBS: will continue questran 4 gm daily; imodium 2 mg three times daily and florastor twice daily   4. Diabetes: hgb a1c 6.1; will continue glucotrol xl 2.5 mg daily   5. Anemia: hgb 10.7; will continue iron daily   6. CVA: is neurologically stable;  will continue ultram 50 mg every 6 hours as needed  7.  Chronic kidney disease: bun/creat: 20/1.28  8. Fibromyalgia: will continue neurontin 300 mg nightly   9. Depression with anxiety: will continue lexapro 10 mg daily  buspar 15 mg twice daily and klonopin 1 mg nightly takes trazodone 150 gm nightly   10. Cellulitis of trach stoma: will complete clindamycin will monitor    Time spent with  patient  50   minutes >50% time spent counseling; reviewing medical record; tests; labs; and developing future plan of care    MD is aware of  resident's narcotic use and is in agreement with current plan of care. We will attempt to wean resident as apropriate   Synthia Innocent NP [facility]  Contact [phone] Monday through Friday 8am- 5pm  After hours call [phone]

## 2016-06-28 ENCOUNTER — Other Ambulatory Visit: Payer: Self-pay | Admitting: *Deleted

## 2016-06-28 MED ORDER — TRAMADOL HCL 50 MG PO TABS: 50.0000 mg | ORAL_TABLET | Freq: Four times a day (QID) | ORAL | 0 refills | Status: DC | PRN

## 2016-06-28 MED ORDER — CLONAZEPAM 1 MG PO TABS: 1.0000 mg | ORAL_TABLET | Freq: Every day | ORAL | 0 refills | Status: DC

## 2016-06-28 NOTE — Telephone Encounter (Signed)
[facility] #[phone] Fax:[phone]  

## 2016-07-15 ENCOUNTER — Ambulatory Visit (HOSPITAL_COMMUNITY): Payer: Medicaid Other

## 2016-07-21 ENCOUNTER — Ambulatory Visit (HOSPITAL_COMMUNITY)
Admission: RE | Admit: 2016-07-21 | Discharge: 2016-07-21 | Disposition: A | Discharge status: Home / Self Care | Payer: Medicaid Other | Source: Ambulatory Visit | Attending: Acute Care | Admitting: Acute Care

## 2016-07-21 DIAGNOSIS — G4733 Obstructive sleep apnea (adult) (pediatric): Secondary | ICD-10-CM

## 2016-07-21 DIAGNOSIS — Z4682 Encounter for fitting and adjustment of non-vascular catheter: Secondary | ICD-10-CM | POA: Insufficient documentation

## 2016-07-21 DIAGNOSIS — Z930 Tracheostomy status: Secondary | ICD-10-CM

## 2016-07-21 NOTE — Progress Notes (Signed)
Tracheostomy Procedure Note  Brantley StageSonja Neas [id number] Sep 11, 1960  Pre Procedure Tracheostomy Information  Trach Brand: Shiley Size: 6.0 Style: Uncuffed Secured by: Velcro   Procedure: trach change    Post Procedure Tracheostomy Information  Trach Brand: Bivona Aire-Cuf Size: 6.0 Style: Uncuffed Secured by: Velcro   Post Procedure Evaluation:  ETCO2 positive color change from yellow to purple : Yes.   Vital signs: pulse 72, respirations 18 and pulse oximetry 100 % Patients current condition: stable Complications: No apparent complications Trach site exam: clean, dry Wound care done: 4 x 4 gauze Patient did tolerate procedure well.   Education: Education on new trach, bivona 6 cfs  Prescription needs: Back up trach    Additional needs: none

## 2016-07-21 NOTE — Progress Notes (Addendum)
   Subjective:  New trach clinic   Patient ID: Yesenia [name], female    DOB: [birth year], 56 y.o.   MRN: [id number]  HPI Yesenia [name] is a 56 year old female w/ h/o OSA. Had trach placed in December this year for definitive treatment of her sleep apnea. She is also followed by Yesenia [name] w/ ENT (whom placed the trach).She was referred to me by her PCP for on-going trach discomfort as well as trach care support. Yesenia [name] reports significant discomfort at the trach site. She says the flange against her neck is uncomfortable and that she feels her neck mobility and sleep is affected by this.    Review of Systems  Constitutional: Negative.   HENT:       Neck discomfort around trach flange. No discoloration or drainage.   Eyes: Negative.   Respiratory: Negative.   Cardiovascular: Negative.   Endocrine: Negative.   Genitourinary: Negative.   Allergic/Immunologic: Negative.   Neurological: Negative.   Hematological: Negative.   Psychiatric/Behavioral: Negative.     Vital signs: pulse 72, respirations 18 and pulse oximetry 100 % Objective:   Physical Exam  Constitutional: She is oriented to person, place, and time. She appears well-developed and well-nourished.  HENT:  Head: Normocephalic and atraumatic.  Mouth/Throat: Oropharynx is clear and moist.  Stoma does have keloid appearing granulation around trach opening   Eyes: Conjunctivae and EOM are normal. Pupils are equal, round, and reactive to light. No scleral icterus.  Neck: Normal range of motion. Neck supple. No tracheal deviation present.  Cardiovascular: Normal rate.  Exam reveals no friction rub.   No murmur heard. Pulmonary/Chest: Effort normal and breath sounds normal. No stridor. No respiratory distress.  Abdominal: Soft. Bowel sounds are normal. She exhibits no distension.  Musculoskeletal: Normal range of motion. She exhibits no edema.  Neurological: She is alert and oriented to person, place, and time.  Skin: Skin is warm. She is not  diaphoretic. No erythema.  Psychiatric: She has a normal mood and affect. Her behavior is normal.    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 The flange on this is smaller AND material softer. Should tolerate this better as it will not irritate the stoma site as much.  Plan  ROV 8 weeks for routine trach change  She has f/u w/ Yesenia [name]. I have encouraged her to keep this. He is considering changing her to a [place] button. I have no issues w/ this and am happy to be available PRN or if she desires to keep what she has we can go that way as well.   Yesenia [name] ACNP-BC [facility]monary[facility]Critical [facility] Pager # [phone] OR # [phone] if no answer

## 2016-07-27 ENCOUNTER — Non-Acute Institutional Stay (SKILLED_NURSING_FACILITY): Payer: Medicaid Other | Admitting: Adult Health

## 2016-07-27 DIAGNOSIS — G4733 Obstructive sleep apnea (adult) (pediatric): Secondary | ICD-10-CM | POA: Diagnosis not present

## 2016-07-27 DIAGNOSIS — E1122 Type 2 diabetes mellitus with diabetic chronic kidney disease: Secondary | ICD-10-CM | POA: Diagnosis not present

## 2016-07-27 DIAGNOSIS — N183 Chronic kidney disease, stage 3 unspecified: Secondary | ICD-10-CM

## 2016-07-27 DIAGNOSIS — Z930 Tracheostomy status: Secondary | ICD-10-CM

## 2016-08-16 ENCOUNTER — Encounter: Payer: Self-pay | Admitting: Adult Health

## 2016-08-16 NOTE — Progress Notes (Addendum)
Location:   [facility]    Place of Service:  SNF (31)    CODE STATUS: full code   Allergies  Allergen Reactions  . Buprenorphine Hcl Hives  . Morphine And Related Hives and Dermatitis    Chief Complaint  Patient presents with  . Discharge Note    HPI:  She is being discharged to home with home health for rn/rt. She will need trach supplies; she has all other necessary dme. She will need her prescriptions to be written and will need to follow up with her medical provider.    Past Medical History:  Diagnosis Date  . Anemia   . Anemia   . Chronic pain   . CKD (chronic kidney disease)   . Diabetes mellitus without complication (HCC)    diet controlled. does not check CBG's  . Fibromyalgia    hospitilized 12/16 due to inability to walk  . Hyperlipemia   . Hypertension   . Insomnia   . Neuropathy (HCC)   . Right sided weakness   . Stroke [facility]) 2014?   no residual weakness   . Vitamin deficiency    Vit D    Past Surgical History:  Procedure Laterality Date  . KNEE SURGERY  1992, 1995, 1997  . LEG SURGERY Right 2007   Surgery x3 2 arthroscopies and one rod  . OOPHORECTOMY Right 1993?  . TRACHEOSTOMY TUBE PLACEMENT N/A 02/13/2016   Procedure: TRACHEOSTOMY;  Surgeon: Christia Reading, MD;  Location: [facility] OR;  Service: ENT;  Laterality: N/A;    Social History   Social History  . Marital status: Single    Spouse name: N/A  . Number of children: 2  . Years of education: 12th    Occupational History  . [employer]    Social History Main Topics  . Smoking status: Never Smoker  . Smokeless tobacco: Never Used  . Alcohol use No  . Drug use: No  . Sexual activity: No   Other Topics Concern  . Not on file   Social History Narrative   Reports no caffeine use    Family History  Problem Relation Age of Onset  . Breast cancer Mother   . Leukemia Brother   . Breast cancer Maternal Grandmother     VITAL SIGNS BP 110/80   Pulse 64   Temp 98.7 F (37.1 C)   Resp 18    Ht 5\' 7"  (1.702 m)   Wt (!) 333 lb (151 kg)   SpO2 97%   BMI 52.16 kg/m   Patient's Medications  New Prescriptions   No medications on file  Previous Medications   ALBUTEROL (PROVENTIL HFA;VENTOLIN HFA) 108 (90 BASE) MCG/ACT INHALER    Inhale 2 puffs into the lungs every 6 (six) hours as needed for wheezing.   ATENOLOL (TENORMIN) 50 MG TABLET    Take 50 mg by mouth daily.   BUSPIRONE (BUSPAR) 15 MG TABLET    Take 15 mg by mouth 2 (two) times daily.   CHOLESTYRAMINE (QUESTRAN) 4 G PACKET    Take 4 g by mouth daily.   CLINDAMYCIN (CLEOCIN) 150 MG CAPSULE    Take 1 capsule (150 mg total) by mouth 3 (three) times daily. For 3days   CLONAZEPAM (KLONOPIN) 1 MG TABLET    Take 1 tablet (1 mg total) by mouth at bedtime.   DIPHENHYDRAMINE-ZINC ACETATE (BENADRYL) CREAM    Apply topically 2 (two) times daily as needed for itching. For 2-3days   ESCITALOPRAM (LEXAPRO) 10 MG  TABLET    Take 10 mg by mouth daily.    FERROUS SULFATE 325 (65 FE) MG TABLET    Take 325 mg by mouth daily.   GABAPENTIN (NEURONTIN) 300 MG CAPSULE    Take 300 mg by mouth at bedtime.   GLIPIZIDE (GLUCOTROL XL) 2.5 MG 24 HR TABLET    Take 1 tablet (2.5 mg total) by mouth daily with breakfast.   HYOSCYAMINE (ANASPAZ) 0.125 MG TBDP DISINTERGRATING TABLET    Place 0.125 mg under the tongue 3 (three) times daily as needed. For Gas-esophageal  Reflux   LOPERAMIDE (IMODIUM) 2 MG CAPSULE    Take 2 mg by mouth 3 (three) times daily before meals.   NYSTATIN (NYSTATIN) POWDER    Apply topically daily.   ONDANSETRON (ZOFRAN) 4 MG TABLET    Take 4 mg by mouth daily. May also give one tablet every 8 hours prn   SACCHAROMYCES BOULARDII (FLORASTOR) 250 MG CAPSULE    Take 250 mg by mouth 2 (two) times daily.   TRAMADOL (ULTRAM) 50 MG TABLET    Take 1 tablet (50 mg total) by mouth every 6 (six) hours as needed for moderate pain or severe pain.   TRAZODONE (DESYREL) 150 MG TABLET    Take 150 mg by mouth at bedtime.  Modified Medications   No  medications on file  Discontinued Medications   No medications on file     SIGNIFICANT DIAGNOSTIC EXAMS   07-06-15: ct scan of  left hip: No evidence of fracture or dislocation. No significant joint effusion. No definite findings seen to suggest septic arthritis at this time  07-17-15: left elbow x-ray: 1. No evidence of acute fracture or dislocation. 2. Several small osseous fragments overlying the medial epicondyle, likely reflecting remote injury.  03-06-16: chest x-ray: Low volumes with some increase in bibasilar atelectasis or infiltrate.  03-07-16: chest x-ray: No acute cardiopulmonary findings.  04-12-16: chest x-ray: Tracheostomy. Shallow inspiration. Mild cardiac enlargement without vascular congestion. No focal airspace disease or consolidation. No blunting of costophrenic angles. No pneumothorax. Tortuous aorta.  04-13-16: pelvic x-ray: There is no evidence of pelvic fracture or diastasis. No pelvic bone lesions are seen.  04-13-16: mri brain: 1. No acute intracranial abnormality. 2. Moderate cerebral white matter disease, unchanged and nonspecific though may reflect age advanced chronic small vessel ischemia.  04-13-16: mri of lumbar spine: 1. At L4-5 there is a mild broad-based disc bulge. Moderate left and mild right facet arthropathy. 2. At L5-S1 there is a broad-based disc bulge. Moderate bilateral facet arthropathy.  05-11-16: chest x-ray: Minimal bibasilar atelectasis noted. Lungs otherwise grossly clear. Mild cardiomegaly.   06-10-16: right knee x-ray: marked osteoarthritis:   06-22-16: chest x-ray: Unchanged cardiomegaly with bibasilar atelectasis.      LABS REVIEWED:   07-17-15: wbc 10.0; hgb 11.3; hct 33.9; mcv 88.1; plt 318; glucose 112; bun 16; creat 1.13; k+ 3.7; na++142; sed rate 60; CRP 3.1  02-05-16: hgb a1c 6.1 03-06-16: wbc 7.1;hb 11.8; hct 36.8;mcv 88.0; plt 396; glucose 110; bun 16; creat 1.26; k+ 3.1; na++ 140; urine culture: 40,000 enterococcus 03-11-16:  wbc 7.4; hgb 9.4; hct 30.2; mcv 88.3; plt 294; glucose 106; bun 13; creat 1.11; k+ 3.9; na++ 141; liver normal albumin 2.9  04-12-16: wbc 7.9; hgb 10.8; hct 35.5; mcv 90.8; plt 319; glucose 105; bun 11; creat 1.18; k+ 3.7; na++ 143; liver normal albumin 3.2; BNP 69; blood culture: no growth 04-13-16: tsh 0.720; urine culture: multiple species 04-14-16: wbc 7.2; hgb 10.4;  hct 33.8; mcv 90.6; plt 283; glucose 86; bun 12; creat 0.96; k+ 3.8; na++ 142  05-12-16: wbc 8.3; hgb 11.4; hct 35.9; mcv 88.2; plt 309; glucose 90; bun 17; creat 1.23; k+ 3.6; na++ 143; liver normal albumin 3.5; urine culture: no growth 06-22-16: wbc 8.9 hgb 11.3; hct 34.7 ;mcv 86.3; plt 310; glucose 112; bun 20 ;creat 1.28; k+ 3.2; na++ 140; liver normal albumin 3.5 06-24-16: wbc 8.4; hgb 10.7; hct 33.2; mcv 86.2; plt 285    Review of Systems  Constitutional: Negative for malaise/fatigue.  Respiratory: Positive for cough. Negative for shortness of breath.        Has trach  Has occasional cough   Cardiovascular: Negative for chest pain, palpitations and leg swelling.  Gastrointestinal: Negative for abdominal pain, constipation, heartburn and nausea.  Musculoskeletal: Negative for back pain, joint pain and myalgias. has trach site pain  Skin: Negative.   Neurological: Negative for dizziness.  Psychiatric/Behavioral: The patient is not nervous/anxious.       Physical Exam  Constitutional: She is oriented to person, place, and time. She appears well-developed and well-nourished. No distress.  Morbidly obese   Eyes: Conjunctivae are normal.  Neck: Neck supple. No JVD present. No thyromegaly present.  Has trach  Cardiovascular: Normal rate, regular rhythm and intact distal pulses.   Respiratory: Effort normal and breath sounds normal. No respiratory distress. She has no wheezes.  GI: Soft. Bowel sounds are normal. She exhibits no distension. There is no tenderness.  Musculoskeletal:  Able to move all extremities      Lymphadenopathy:    She has no cervical adenopathy.  Neurological: She is alert and oriented to person, place, and time.  Skin: Skin is warm and dry. She is not diaphoretic.  Psychiatric: She has a normal mood and affect.        ASSESSMENT/ PLAN:   Patient is being discharged with the following home health services:  Rt/rn: to evaluate and treat as indicated for medication management and trach management   Patient is being discharged with the following durable medical equipment:  #6 bivona cuffless trach tube one month supply  Patient has been advised to f/u with their PCP in 1-2 weeks to bring them up to date on their rehab stay.  Social services at facility was responsible for arranging this appointment.  Pt was provided with a 30 day supply of prescriptions for medications and refills must be obtained from their PCP.  For controlled substances, a more limited supply may be provided adequate until PCP appointment only.  #20 tylenol #3 tabs; #15; klonopin 1 mg tabs; #15 ultram 50 mg tabs    Ordered oxygen therapy for chronic kidney disease; hypertension and dyspnea on exertion    Time spent with patient 45   minutes >50% time spent counseling; reviewing medical record; tests; labs; and developing future plan of care   Synthia Innocent NP [facility]  Contact [phone] Monday through Friday 8am- 5pm  After hours call [phone]

## 2016-09-15 ENCOUNTER — Ambulatory Visit (HOSPITAL_COMMUNITY): Payer: Medicaid Other

## 2016-09-22 ENCOUNTER — Ambulatory Visit (HOSPITAL_COMMUNITY): Payer: Medicaid Other

## 2016-09-30 ENCOUNTER — Ambulatory Visit (HOSPITAL_COMMUNITY)
Admission: RE | Admit: 2016-09-30 | Discharge: 2016-09-30 | Disposition: A | Discharge status: Home / Self Care | Payer: Medicaid Other | Source: Ambulatory Visit | Attending: Adult Health | Admitting: Adult Health

## 2016-09-30 ENCOUNTER — Ambulatory Visit (HOSPITAL_COMMUNITY): Admission: RE | Admit: 2016-09-30 | Payer: Medicaid Other | Source: Ambulatory Visit | Admitting: Pulmonary Disease

## 2016-09-30 ENCOUNTER — Inpatient Hospital Stay (HOSPITAL_COMMUNITY): Payer: Medicaid Other

## 2016-09-30 ENCOUNTER — Inpatient Hospital Stay (HOSPITAL_COMMUNITY)
Admission: AD | Admit: 2016-09-30 | Discharge: 2016-10-02 | DRG: 189 | Disposition: A | Discharge status: Home / Self Care | Payer: Medicaid Other | Source: Ambulatory Visit | Attending: Internal Medicine | Admitting: Internal Medicine

## 2016-09-30 DIAGNOSIS — D509 Iron deficiency anemia, unspecified: Secondary | ICD-10-CM | POA: Diagnosis present

## 2016-09-30 DIAGNOSIS — Z930 Tracheostomy status: Secondary | ICD-10-CM | POA: Diagnosis not present

## 2016-09-30 DIAGNOSIS — G8929 Other chronic pain: Secondary | ICD-10-CM | POA: Diagnosis present

## 2016-09-30 DIAGNOSIS — E1140 Type 2 diabetes mellitus with diabetic neuropathy, unspecified: Secondary | ICD-10-CM | POA: Diagnosis present

## 2016-09-30 DIAGNOSIS — Z888 Allergy status to other drugs, medicaments and biological substances status: Secondary | ICD-10-CM

## 2016-09-30 DIAGNOSIS — J0410 Acute tracheitis without obstruction: Secondary | ICD-10-CM | POA: Diagnosis present

## 2016-09-30 DIAGNOSIS — F419 Anxiety disorder, unspecified: Secondary | ICD-10-CM | POA: Diagnosis present

## 2016-09-30 DIAGNOSIS — R6510 Systemic inflammatory response syndrome (SIRS) of non-infectious origin without acute organ dysfunction: Secondary | ICD-10-CM | POA: Diagnosis present

## 2016-09-30 DIAGNOSIS — E1159 Type 2 diabetes mellitus with other circulatory complications: Secondary | ICD-10-CM | POA: Diagnosis present

## 2016-09-30 DIAGNOSIS — J9621 Acute and chronic respiratory failure with hypoxia: Secondary | ICD-10-CM | POA: Diagnosis present

## 2016-09-30 DIAGNOSIS — K219 Gastro-esophageal reflux disease without esophagitis: Secondary | ICD-10-CM | POA: Diagnosis present

## 2016-09-30 DIAGNOSIS — J9601 Acute respiratory failure with hypoxia: Secondary | ICD-10-CM | POA: Diagnosis present

## 2016-09-30 DIAGNOSIS — L0291 Cutaneous abscess, unspecified: Secondary | ICD-10-CM

## 2016-09-30 DIAGNOSIS — N183 Chronic kidney disease, stage 3 (moderate): Secondary | ICD-10-CM | POA: Diagnosis present

## 2016-09-30 DIAGNOSIS — Z6841 Body Mass Index (BMI) 40.0 and over, adult: Secondary | ICD-10-CM | POA: Diagnosis not present

## 2016-09-30 DIAGNOSIS — Z90722 Acquired absence of ovaries, bilateral: Secondary | ICD-10-CM | POA: Diagnosis not present

## 2016-09-30 DIAGNOSIS — Z8614 Personal history of Methicillin resistant Staphylococcus aureus infection: Secondary | ICD-10-CM

## 2016-09-30 DIAGNOSIS — L910 Hypertrophic scar: Secondary | ICD-10-CM | POA: Diagnosis present

## 2016-09-30 DIAGNOSIS — E1122 Type 2 diabetes mellitus with diabetic chronic kidney disease: Secondary | ICD-10-CM | POA: Diagnosis present

## 2016-09-30 DIAGNOSIS — I10 Essential (primary) hypertension: Secondary | ICD-10-CM

## 2016-09-30 DIAGNOSIS — Z7984 Long term (current) use of oral hypoglycemic drugs: Secondary | ICD-10-CM

## 2016-09-30 DIAGNOSIS — Z79899 Other long term (current) drug therapy: Secondary | ICD-10-CM

## 2016-09-30 DIAGNOSIS — G4733 Obstructive sleep apnea (adult) (pediatric): Secondary | ICD-10-CM | POA: Diagnosis present

## 2016-09-30 DIAGNOSIS — I152 Hypertension secondary to endocrine disorders: Secondary | ICD-10-CM | POA: Diagnosis present

## 2016-09-30 DIAGNOSIS — E1142 Type 2 diabetes mellitus with diabetic polyneuropathy: Secondary | ICD-10-CM | POA: Diagnosis present

## 2016-09-30 DIAGNOSIS — E785 Hyperlipidemia, unspecified: Secondary | ICD-10-CM | POA: Diagnosis present

## 2016-09-30 DIAGNOSIS — M797 Fibromyalgia: Secondary | ICD-10-CM | POA: Diagnosis present

## 2016-09-30 DIAGNOSIS — Z885 Allergy status to narcotic agent status: Secondary | ICD-10-CM

## 2016-09-30 DIAGNOSIS — G4700 Insomnia, unspecified: Secondary | ICD-10-CM | POA: Diagnosis present

## 2016-09-30 DIAGNOSIS — Z8673 Personal history of transient ischemic attack (TIA), and cerebral infarction without residual deficits: Secondary | ICD-10-CM | POA: Diagnosis not present

## 2016-09-30 DIAGNOSIS — J9600 Acute respiratory failure, unspecified whether with hypoxia or hypercapnia: Secondary | ICD-10-CM | POA: Diagnosis not present

## 2016-09-30 DIAGNOSIS — I129 Hypertensive chronic kidney disease with stage 1 through stage 4 chronic kidney disease, or unspecified chronic kidney disease: Secondary | ICD-10-CM | POA: Diagnosis present

## 2016-09-30 LAB — COMPREHENSIVE METABOLIC PANEL
ALBUMIN: 3.5 g/dL (ref 3.5–5.0)
ALK PHOS: 81 U/L (ref 38–126)
ALT: 18 U/L (ref 14–54)
ANION GAP: 12 (ref 5–15)
AST: 22 U/L (ref 15–41)
BILIRUBIN TOTAL: 0.8 mg/dL (ref 0.3–1.2)
BUN: 13 mg/dL (ref 6–20)
CALCIUM: 9.4 mg/dL (ref 8.9–10.3)
CO2: 21 mmol/L — AB (ref 22–32)
CREATININE: 1.2 mg/dL — AB (ref 0.44–1.00)
Chloride: 107 mmol/L (ref 101–111)
GFR calc Af Amer: 58 mL/min — ABNORMAL LOW (ref >60)
GFR calc non Af Amer: 50 mL/min — ABNORMAL LOW (ref >60)
Glucose, Bld: 101 mg/dL — ABNORMAL HIGH (ref 65–99)
Potassium: 3.7 mmol/L (ref 3.5–5.1)
Sodium: 140 mmol/L (ref 135–145)
TOTAL PROTEIN: 7.5 g/dL (ref 6.5–8.1)

## 2016-09-30 LAB — TYPE AND SCREEN
ABO/RH(D): O POS
Antibody Screen: NEGATIVE

## 2016-09-30 LAB — CBC WITH DIFFERENTIAL/PLATELET
BASOS ABS: 0 10*3/uL (ref 0.0–0.1)
BASOS PCT: 0 %
Eosinophils Absolute: 0.1 10*3/uL (ref 0.0–0.7)
Eosinophils Relative: 1 %
HEMATOCRIT: 37.9 % (ref 36.0–46.0)
Hemoglobin: 12.5 g/dL (ref 12.0–15.0)
LYMPHS PCT: 31 %
Lymphs Abs: 3.3 10*3/uL (ref 0.7–4.0)
MCH: 27.8 pg (ref 26.0–34.0)
MCHC: 33 g/dL (ref 30.0–36.0)
MCV: 84.4 fL (ref 78.0–100.0)
Monocytes Absolute: 0.9 10*3/uL (ref 0.1–1.0)
Monocytes Relative: 9 %
NEUTROS ABS: 6.3 10*3/uL (ref 1.7–7.7)
NEUTROS PCT: 59 %
Platelets: 332 10*3/uL (ref 150–400)
RBC: 4.49 MIL/uL (ref 3.87–5.11)
RDW: 14.5 % (ref 11.5–15.5)
WBC: 10.6 10*3/uL — AB (ref 4.0–10.5)

## 2016-09-30 LAB — PHOSPHORUS: Phosphorus: 1.1 mg/dL — ABNORMAL LOW (ref 2.5–4.6)

## 2016-09-30 LAB — LIPASE, BLOOD: Lipase: 23 U/L (ref 11–51)

## 2016-09-30 LAB — GLUCOSE, CAPILLARY
GLUCOSE-CAPILLARY: 134 mg/dL — AB (ref 65–99)
GLUCOSE-CAPILLARY: 76 mg/dL (ref 65–99)
Glucose-Capillary: 116 mg/dL — ABNORMAL HIGH (ref 65–99)
Glucose-Capillary: 99 mg/dL (ref 65–99)

## 2016-09-30 LAB — PROTIME-INR
INR: 1.16
Prothrombin Time: 14.8 seconds (ref 11.4–15.2)

## 2016-09-30 LAB — MRSA PCR SCREENING: MRSA by PCR: POSITIVE — AB

## 2016-09-30 LAB — PROCALCITONIN: Procalcitonin: <0.1 ng/mL

## 2016-09-30 LAB — MAGNESIUM: Magnesium: 1.8 mg/dL (ref 1.7–2.4)

## 2016-09-30 LAB — AMYLASE: Amylase: 45 U/L (ref 28–100)

## 2016-09-30 LAB — LACTIC ACID, PLASMA
LACTIC ACID, VENOUS: 3.1 mmol/L — AB (ref 0.5–1.9)
LACTIC ACID, VENOUS: 2.6 mmol/L — AB (ref 0.5–1.9)

## 2016-09-30 LAB — TROPONIN I: Troponin I: <0.03 ng/mL (ref <0.03)

## 2016-09-30 MED ORDER — FERROUS SULFATE 325 (65 FE) MG PO TABS
325.0000 mg | ORAL_TABLET | Freq: Every day | ORAL | Status: DC
  Administered 2016-09-30 – 2016-10-02 (×2): 325 mg via ORAL
  Filled 2016-09-30 (×3): qty 1

## 2016-09-30 MED ORDER — ALBUTEROL SULFATE (2.5 MG/3ML) 0.083% IN NEBU
INHALATION_SOLUTION | RESPIRATORY_TRACT | Status: AC
  Administered 2016-09-30: 2.5 mg
  Filled 2016-09-30: qty 3

## 2016-09-30 MED ORDER — SODIUM CHLORIDE 0.9% FLUSH: 10.0000 mL | INTRAVENOUS | Status: DC | PRN

## 2016-09-30 MED ORDER — MIDAZOLAM HCL 2 MG/2ML IJ SOLN
1.0000 mg | INTRAMUSCULAR | Status: DC | PRN
  Filled 2016-09-30: qty 2

## 2016-09-30 MED ORDER — FENTANYL CITRATE (PF) 100 MCG/2ML IJ SOLN
25.0000 ug | INTRAMUSCULAR | Status: DC | PRN
  Administered 2016-09-30 (×2): 50 ug via INTRAVENOUS
  Administered 2016-10-01: 25 ug via INTRAVENOUS
  Administered 2016-10-01 – 2016-10-02 (×3): 50 ug via INTRAVENOUS
  Filled 2016-09-30 (×6): qty 2

## 2016-09-30 MED ORDER — VANCOMYCIN HCL 10 G IV SOLR
2500.0000 mg | Freq: Once | INTRAVENOUS | Status: AC
  Administered 2016-09-30: 2500 mg via INTRAVENOUS
  Filled 2016-09-30: qty 2500

## 2016-09-30 MED ORDER — ESCITALOPRAM OXALATE 10 MG PO TABS
10.0000 mg | ORAL_TABLET | Freq: Every day | ORAL | Status: DC
  Administered 2016-09-30 – 2016-10-02 (×3): 10 mg via ORAL
  Filled 2016-09-30 (×3): qty 1

## 2016-09-30 MED ORDER — MIDAZOLAM HCL 2 MG/2ML IJ SOLN
INTRAMUSCULAR | Status: AC
  Administered 2016-09-30: 1 mg via INTRAVENOUS
  Filled 2016-09-30: qty 2

## 2016-09-30 MED ORDER — ATENOLOL 25 MG PO TABS
50.0000 mg | ORAL_TABLET | Freq: Every day | ORAL | Status: DC
  Administered 2016-10-01 – 2016-10-02 (×2): 50 mg via ORAL
  Filled 2016-09-30: qty 1
  Filled 2016-09-30: qty 2

## 2016-09-30 MED ORDER — SODIUM CHLORIDE 0.9% FLUSH
10.0000 mL | Freq: Two times a day (BID) | INTRAVENOUS | Status: DC
  Administered 2016-09-30 – 2016-10-02 (×4): 10 mL

## 2016-09-30 MED ORDER — MIDAZOLAM HCL 2 MG/2ML IJ SOLN
1.0000 mg | Freq: Once | INTRAMUSCULAR | Status: AC
  Administered 2016-09-30: 1 mg via INTRAVENOUS

## 2016-09-30 MED ORDER — MIDAZOLAM HCL 5 MG/ML IJ SOLN: 1.0000 mg | Freq: Once | INTRAMUSCULAR | Status: DC

## 2016-09-30 MED ORDER — DEXTROSE 5 % IV SOLN
2.0000 g | Freq: Three times a day (TID) | INTRAVENOUS | Status: DC
Start: 2016-09-30 — End: 2016-10-01
  Administered 2016-09-30 – 2016-10-01 (×2): 2 g via INTRAVENOUS
  Filled 2016-09-30 (×3): qty 2

## 2016-09-30 MED ORDER — IOPAMIDOL (ISOVUE-370) INJECTION 76%
INTRAVENOUS | Status: AC
  Administered 2016-09-30: 100 mL
  Filled 2016-09-30: qty 100

## 2016-09-30 MED ORDER — GABAPENTIN 300 MG PO CAPS
300.0000 mg | ORAL_CAPSULE | Freq: Every day | ORAL | Status: DC
  Administered 2016-09-30 – 2016-10-01 (×2): 300 mg via ORAL
  Filled 2016-09-30 (×2): qty 1

## 2016-09-30 MED ORDER — CHLORHEXIDINE GLUCONATE CLOTH 2 % EX PADS
6.0000 | MEDICATED_PAD | Freq: Every day | CUTANEOUS | Status: DC
  Administered 2016-09-30: 6 via TOPICAL

## 2016-09-30 MED ORDER — TRAZODONE HCL 100 MG PO TABS
150.0000 mg | ORAL_TABLET | Freq: Every day | ORAL | Status: DC
  Administered 2016-09-30 – 2016-10-01 (×2): 150 mg via ORAL
  Filled 2016-09-30 (×2): qty 1

## 2016-09-30 MED ORDER — MUPIROCIN 2 % EX OINT
TOPICAL_OINTMENT | Freq: Two times a day (BID) | CUTANEOUS | Status: DC
  Administered 2016-09-30 – 2016-10-01 (×2): 1 via NASAL
  Administered 2016-10-01 – 2016-10-02 (×2): via NASAL
  Filled 2016-09-30 (×2): qty 22

## 2016-09-30 MED ORDER — TRAMADOL HCL 50 MG PO TABS
50.0000 mg | ORAL_TABLET | Freq: Four times a day (QID) | ORAL | Status: DC | PRN
  Administered 2016-10-02: 50 mg via ORAL
  Filled 2016-09-30: qty 1

## 2016-09-30 MED ORDER — ORAL CARE MOUTH RINSE: 15.0000 mL | Freq: Two times a day (BID) | OROMUCOSAL | Status: DC

## 2016-09-30 MED ORDER — FENTANYL CITRATE (PF) 100 MCG/2ML IJ SOLN
INTRAMUSCULAR | Status: AC
  Administered 2016-09-30: 100 ug
  Filled 2016-09-30: qty 2

## 2016-09-30 MED ORDER — HEPARIN SODIUM (PORCINE) 5000 UNIT/ML IJ SOLN
5000.0000 [IU] | Freq: Three times a day (TID) | INTRAMUSCULAR | Status: DC
  Administered 2016-09-30 – 2016-10-02 (×6): 5000 [IU] via SUBCUTANEOUS
  Filled 2016-09-30 (×6): qty 1

## 2016-09-30 MED ORDER — LORAZEPAM 2 MG/ML IJ SOLN
INTRAMUSCULAR | Status: AC
  Filled 2016-09-30: qty 1

## 2016-09-30 MED ORDER — LACTATED RINGERS IV SOLN
INTRAVENOUS | Status: DC
  Administered 2016-09-30 – 2016-10-01 (×2): via INTRAVENOUS

## 2016-09-30 MED ORDER — DEXTROSE 5 % IV SOLN
2.0000 g | Freq: Once | INTRAVENOUS | Status: AC
  Administered 2016-09-30: 2 g via INTRAVENOUS
  Filled 2016-09-30: qty 2

## 2016-09-30 MED ORDER — CLONAZEPAM 1 MG PO TABS: 1.0000 mg | ORAL_TABLET | Freq: Every day | ORAL | Status: DC

## 2016-09-30 MED ORDER — INSULIN ASPART 100 UNIT/ML ~~LOC~~ SOLN
0.0000 [IU] | SUBCUTANEOUS | Status: DC
  Administered 2016-10-01: 2 [IU] via SUBCUTANEOUS

## 2016-09-30 MED ORDER — LORAZEPAM 2 MG/ML IJ SOLN
0.5000 mg | INTRAMUSCULAR | Status: DC | PRN
  Administered 2016-09-30 – 2016-10-01 (×4): 1 mg via INTRAVENOUS
  Filled 2016-09-30 (×3): qty 1

## 2016-09-30 MED ORDER — CHOLESTYRAMINE 4 G PO PACK
4.0000 g | PACK | Freq: Every day | ORAL | Status: DC
  Administered 2016-09-30 – 2016-10-02 (×2): 4 g via ORAL
  Filled 2016-09-30 (×4): qty 1

## 2016-09-30 MED ORDER — CHLORHEXIDINE GLUCONATE 0.12 % MT SOLN
15.0000 mL | Freq: Two times a day (BID) | OROMUCOSAL | Status: DC
  Administered 2016-09-30 – 2016-10-01 (×3): 15 mL via OROMUCOSAL
  Filled 2016-09-30: qty 15

## 2016-09-30 MED ORDER — VANCOMYCIN HCL 10 G IV SOLR
1250.0000 mg | Freq: Two times a day (BID) | INTRAVENOUS | Status: DC
  Administered 2016-10-01: 1250 mg via INTRAVENOUS
  Filled 2016-09-30 (×2): qty 1250

## 2016-09-30 MED ORDER — CLONAZEPAM 0.5 MG PO TABS
1.0000 mg | ORAL_TABLET | Freq: Every day | ORAL | Status: DC
  Administered 2016-09-30 – 2016-10-01 (×2): 1 mg via ORAL
  Filled 2016-09-30: qty 2
  Filled 2016-09-30: qty 1

## 2016-09-30 MED ORDER — ALBUTEROL SULFATE (2.5 MG/3ML) 0.083% IN NEBU
2.5000 mg | INHALATION_SOLUTION | RESPIRATORY_TRACT | Status: DC
  Administered 2016-09-30 – 2016-10-01 (×4): 2.5 mg via RESPIRATORY_TRACT
  Filled 2016-09-30 (×2): qty 3

## 2016-09-30 MED ORDER — BUSPIRONE HCL 5 MG PO TABS
15.0000 mg | ORAL_TABLET | Freq: Two times a day (BID) | ORAL | Status: DC
  Administered 2016-09-30 – 2016-10-02 (×4): 15 mg via ORAL
  Filled 2016-09-30 (×4): qty 1

## 2016-09-30 NOTE — Progress Notes (Signed)
Peripherally Inserted Central Catheter/Midline Placement  The IV Nurse has discussed with the patient and/or persons authorized to consent for the patient, the purpose of this procedure and the potential benefits and risks involved with this procedure.  The benefits include less needle sticks, lab draws from the catheter, and the patient may be discharged home with the catheter. Risks include, but not limited to, infection, bleeding, blood clot (thrombus formation), and puncture of an artery; nerve damage and irregular heartbeat and possibility to perform a PICC exchange if needed/ordered by physician.  Alternatives to this procedure were also discussed.  B[employer] PICC patient education guide, fact sheet on infection prevention and patient information card has been provided to patient /or left at bedside.    PICC/Midline Placement Documentation        Yesenia [name], Yesenia [name] 09/30/2016, 3:25 PM

## 2016-09-30 NOTE — H&P (Signed)
PULMONARY / CRITICAL CARE MEDICINE   Name: Yesenia [name] MRN: [id number] DOB: 01-26-1961    ADMISSION DATE:  09/30/2016 CONSULTATION DATE:  3/22  REFERRING MD:  trach clinic   CHIEF COMPLAINT:  Acute hypoxic respiratory failure   HISTORY OF PRESENT ILLNESS:    This is a 56 year old female followed by PB at trach clinic w/ chronic trach for definitive treatment of OSA. She was referred initially to the trach clinic by Dr Jenne Pane w/ ENT for chronic trach related discomfort. Seen last in our clinic on 1/10 at which time she reported significant discomfort at trach stoma site. At that time on exam she had small amount of Keloid granulation tissue around trach stoma site. We changed her to a portex XL trach which seemed to relieve the discomfort almost immediately. She presented to the trach clinic as routine 8 week f/u on 3/22. On arrival she was found to be in acute distress. She notes that her neck discomfort got worse initially about a week after our last visit. She does not recall fever or sick exposure. She notes over the last 3-4 weeks she has had worsening shortness of breath and at times a thick fouls smelling mucous "bubble" from around her trach starting about a week ago.  She denies: HA, nasal congestion. The pain is primarily localized at the stoma site w/ a large keloid collection at about 6 o'clock. w/ what appears to be a small sinus at the bottom of the keloid changes. On examination A small amount of fouls smelling discharge was expelled from this sinus. Her saturations were in 80s initially and she became progressively anxious w/ worsening respiratory distress and associated anxiety so it was decided that she would need to be admitted to further evaluate the etiology of her distress.   PAST MEDICAL HISTORY :  She  has a past medical history of Anemia; Anemia; Chronic pain; CKD (chronic kidney disease); Diabetes mellitus without complication (HCC); Fibromyalgia; Hyperlipemia; Hypertension;  Insomnia; Neuropathy (HCC); Right sided weakness; Stroke (HCC) (2014?); and Vitamin deficiency.  PAST SURGICAL HISTORY: She  has a past surgical history that includes Leg Surgery (Right, 2007); Oophorectomy (Right, 1993?); Knee surgery (1992, 1995, 1997); and Tracheostomy tube placement (N/A, 02/13/2016).  Allergies  Allergen Reactions  . Buprenorphine Hcl Hives  . Morphine And Related Hives and Dermatitis    No current facility-administered medications on file prior to encounter.    Current Outpatient Prescriptions on File Prior to Encounter  Medication Sig  . albuterol (PROVENTIL HFA;VENTOLIN HFA) 108 (90 BASE) MCG/ACT inhaler Inhale 2 puffs into the lungs every 6 (six) hours as needed for wheezing.  Marland Kitchen atenolol (TENORMIN) 50 MG tablet Take 50 mg by mouth daily.  . busPIRone (BUSPAR) 15 MG tablet Take 15 mg by mouth 2 (two) times daily.  . cholestyramine (QUESTRAN) 4 g packet Take 4 g by mouth daily.  . clindamycin (CLEOCIN) 150 MG capsule Take 1 capsule (150 mg total) by mouth 3 (three) times daily. For 3days  . clonazePAM (KLONOPIN) 1 MG tablet Take 1 tablet (1 mg total) by mouth at bedtime.  . diphenhydrAMINE-zinc acetate (BENADRYL) cream Apply topically 2 (two) times daily as needed for itching. For 2-3days  . escitalopram (LEXAPRO) 10 MG tablet Take 10 mg by mouth daily.   . ferrous sulfate 325 (65 FE) MG tablet Take 325 mg by mouth daily.  Marland Kitchen gabapentin (NEURONTIN) 300 MG capsule Take 300 mg by mouth at bedtime.  Marland Kitchen glipiZIDE (GLUCOTROL XL) 2.5 MG  24 hr tablet Take 1 tablet (2.5 mg total) by mouth daily with breakfast.  . hyoscyamine (ANASPAZ) 0.125 MG TBDP disintergrating tablet Place 0.125 mg under the tongue 3 (three) times daily as needed. For Gas-esophageal  Reflux  . loperamide (IMODIUM) 2 MG capsule Take 2 mg by mouth 3 (three) times daily before meals.  . nystatin (NYSTATIN) powder Apply topically daily.  . ondansetron (ZOFRAN) 4 MG tablet Take 4 mg by mouth daily. May also  give one tablet every 8 hours prn  . saccharomyces boulardii (FLORASTOR) 250 MG capsule Take 250 mg by mouth 2 (two) times daily.  . traMADol (ULTRAM) 50 MG tablet Take 1 tablet (50 mg total) by mouth every 6 (six) hours as needed for moderate pain or severe pain.  . traZODone (DESYREL) 150 MG tablet Take 150 mg by mouth at bedtime.    FAMILY HISTORY:  Her indicated that her mother is deceased. She indicated that the status of her brother is unknown. She indicated that the status of her maternal grandmother is unknown.    SOCIAL HISTORY: She  reports that she has never smoked. She has never used smokeless tobacco. She reports that she does not drink alcohol or use drugs.  REVIEW OF SYSTEMS:   Review of Systems:   Bolds are positive  Constitutional: weight loss, gain, night sweats, Fevers, chills, fatigue .  HEENT: headaches, Sore throat, sneezing, nasal congestion, post nasal drip, Difficulty swallowing, Tooth/dental problems, visual complaints visual changes, ear ache neck is very sensitive. Has intermittent "bubble" from bottom of her trach.  CV:  chest pain, radiates,Orthopnea, PND, swelling in lower extremities, dizziness, palpitations, syncope.  GI  heartburn, indigestion, abdominal pain, nausea, vomiting, diarrhea, change in bowel habits, loss of appetite, bloody stools.  Resp: cough, not productive , hemoptysis, dyspnea at rest , no chest pain, pleuritic.  Skin: rash or itching or icterus GU: dysuria, change in color of urine, urgency or frequency. flank pain, hematuria  MS: joint pain or swelling. decreased range of motion  Psych: change in mood or affect. depression or anxiety.  Neuro: difficulty with speech, weakness, numbness, ataxia    SUBJECTIVE:  Very anxious VITAL SIGNS: BP 128/85 (BP Location: Right Wrist)   Pulse 69   Temp 99.4 F (37.4 C) (Oral)   Resp (!) 28   SpO2 99%   HEMODYNAMICS:    VENTILATOR SETTINGS:    INTAKE / OUTPUT: No intake/output data  recorded.  General appearance:  56 Year old  female, who is chronically trach dependent and currently in acute distress Eyes: anicteric sclerae , moist conjunctivae; PERRL, EOMI bilaterally. Mouth:  membranes and no mucosal ulcerations; normal hard and soft palate Neck: Trachea midline; neck supple, no JVD. She has a size 6 portex trach that was just replaced her in the intensive care. She has a large Keloid appearing scar tissue at between 6 and 7 o'clock and there appears to be a small sinus at about 6 o'clock of the tissue.  Lungs/chest: CTA, with normal respiratory effort and no intercostal retractions CV: RRR, no MRGs  Abdomen: Soft, non-tender; no masses or HSM Extremities: No peripheral edema or extremity lymphadenopathy Skin: Normal temperature, turgor and texture; no rash, ulcers or subcutaneous nodules Psych: Appropriate affect, alert and oriented to person, place and time  LABS:  BMET No results for input(s): NA, K, CL, CO2, BUN, CREATININE, GLUCOSE in the last 168 hours.  Electrolytes No results for input(s): CALCIUM, MG, PHOS in the last 168 hours.  CBC  No results for input(s): WBC, HGB, HCT, PLT in the last 168 hours.  Coag's No results for input(s): APTT, INR in the last 168 hours.  Sepsis Markers No results for input(s): LATICACIDVEN, PROCALCITON, O2SATVEN in the last 168 hours.  ABG No results for input(s): PHART, PCO2ART, PO2ART in the last 168 hours.  Liver Enzymes No results for input(s): AST, ALT, ALKPHOS, BILITOT, ALBUMIN in the last 168 hours.  Cardiac Enzymes No results for input(s): TROPONINI, PROBNP in the last 168 hours.  Glucose  Recent Labs Lab 09/30/16 1353  GLUCAP 116*    Imaging No results found.   STUDIES:  CT neck 3/22>>> CT chest 3/22>>>  CULTURES: Sputum 3/22>>>  ANTIBIOTICS: fortaz 3/22>>> vanc 3/22>>>  SIGNIFICANT EVENTS:   LINES/TUBES:  Impression/Plan  Respiratory failure, acute (HCC).  Etiology not clear  yet. Exacerbated acutely by anxiety. Need to r/o neck abscess, PE, or cardiac event. PNA seems unlikely as no secretions in airway.  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 R/o sepsis Plan Ck lactic acid BC Sputum if able CT imaging as above  Anxiety  h/o   Fibromyalgia Plan Cont Buspar, Klonopin, lexapro and desyrel   Essential hypertension Plan Cont atenolol  Tele  stage 3 chronic kidney disease: last Cr 1.1 about 3 mo ago Plan Stat chemistry IVF w/ LR  Type 2 diabetes mellitus  Plan ssi hold orals  GERD (gastroesophageal reflux disease) Plan PPI  Iron deficiency anemia Plan Cont supplementation   DISCUSSION:  Admitting to the ICU. She is in acute distress. Has marked neck pain and a small sinus area at the bottom of a large keloid process on her trach stoma site. I am concerned she may have abscess. I am not sure how to define her respiratory distress. This could be sepsis (meets SIRS criteria) from a abscess. Certainly think that anxiety is at least to some extent driving this. Doubt PNA. Evaluated her airway via bedside bronch. There was absolutely no secretions and airway were clear. Also on ddx: Pulmonary emboli, acute cardiac event.  Will get stat labs. CT neck, and chest. Start empiric abx. Further plan depending on workup.   Simonne Martinet ACNP-BC [facility] Pulmonary/Critical Care Pager # [phone] OR # [phone] if no answer    09/30/2016, 2:53 PM  ATTENDING NOTE / ATTESTATION NOTE :   I have discussed the case with the resident/APP  Anders Simmonds NP.   I agree with the resident/APP's  history, physical examination, assessment, and plans.    I have edited the above note and modified it according to our agreed history, physical examination, assessment and plan.   Briefly, pt is a  56 year old  female followed by PB at trach clinic w/ chronic trach for definitive treatment of OSA. She was referred initially to the trach clinic by Dr Jenne Pane w/ ENT for chronic trach related discomfort. She was seen on  07/21/16  at which time she reported significant discomfort at trach stoma site. Her trache was changed to a portex XL trach which seemed to relieve the discomfort almost immediately.   She followed up with the  trach clinic on March 22. She was noted to be in respiratory distress. There was note of foul smelling discharge around the trache site. She also presented with 3-4 weeks of worsening dyspnea. She  has had the tracheal discharge for a week now. The pain was primarily localized at the stoma site w/ a large keloid collection at about 6 o'clock. w/ what appears to be a small sinus at the bottom of the keloid changes.   On examination by [facility] at the clinic,  a small amount of fouls smelling discharge was expelled from this sinus. Her saturations were in 80s initially and she became progressively anxious w/ worsening respiratory distress and associated anxiety so it was decided that she would need to be admitted to further evaluate the etiology of her distress.   Patient was seen in the ICU, comfortable, in and out of respiratory distress. Bronchoscopy done revealed patent airways with minimal to no secretions.   Vitals:  Vitals:   09/30/16 1403 09/30/16 1500 09/30/16 1600 09/30/16 1605  BP: 128/85  119/78   Pulse: 69 71 71   Resp: (!) 28 13 (!) 37   Temp:    98.5 F (36.9 C)  TempSrc:    Oral  SpO2: 99% 100% 100%   Weight:   (!) 153.9 kg (339 lb 4.6 oz)   Height:   5\' 6"  (1.676 m)     Constitutional/General: well-nourished, well-developed, comfortable. In and out of distress related to anxiety.  Body mass index is 54.76 kg/m. Wt Readings from Last 3 Encounters:  09/30/16 (!) 153.9 kg (339 lb 4.6 oz)  07/27/16 (!) 151 kg (333 lb)  06/25/16 (!) 150.1 kg (331 lb)    HEENT: PERLA,  anicteric sclerae. (-) Oral thrush.  Neck: No masses. Midline trachea. No JVD, (-) LAD. (-) bruits appreciated. Tracheostomy in place.  Some purulent secretions seen. Some cysts around the tracheostomy.   Respiratory/Chest: Grossly normal chest. (-) deformity. (-) Accessory muscle use.  Symmetric expansion. Diminished BS on both lower lung zones. (-) wheezing, crackles,  Occasional rhonhi at the bases.  (-) egophony  Cardiovascular: Regular rate and  rhythm, heart sounds normal, no murmur or gallops,  Trace peripheral edema  Gastrointestinal:  Normal bowel sounds. Soft, non-tender. No hepatosplenomegaly.  (-) masses.   Musculoskeletal:  Normal muscle tone.   Extremities: Grossly normal. (-) clubbing, cyanosis.  (-) edema  Skin: (-) rash,lesions seen.   Neurological/Psychiatric : sedated, intubated. CN grossly intact. (-) lateralizing signs.     CBC Recent Labs     09/30/16  1321  WBC  10.6*  HGB  12.5  HCT  37.9  PLT  332    Coag's Recent Labs     09/30/16  1321  INR  1.16    BMET Recent Labs     09/30/16  1321  NA  140  K  3.7  CL  107  CO2  21*  BUN  13  CREATININE  1.20*  GLUCOSE  101*    Electrolytes Recent Labs     09/30/16  1321  CALCIUM  9.4  MG  1.8  PHOS  1.1*    Sepsis Markers No results for input(s): PROCALCITON, O2SATVEN in the last 72 hours.  Invalid input(s): LACTICACIDVEN  ABG No results for input(s): PHART, PCO2ART, PO2ART in the last 72 hours.  Liver Enzymes Recent Labs     09/30/16  1321  AST  22  ALT  18  ALKPHOS  81  BILITOT  0.8  ALBUMIN  3.5    Cardiac Enzymes Recent Labs     09/30/16  1321  TROPONINI  <0.03    Glucose Recent Labs     09/30/16  1353  09/30/16  1603  GLUCAP  116*  76    Imaging No results found.  Assessment/Plan: Acute hypoxemic respiratory failure secondary to the following: Concern for neck abscess/tracheal stoma Concern for HCAP (although bronchoscopy was reassuring). H/O  MRSA Anxiety OSA but has a tracheostomy S/P Tracheostomy - Keep O2 saturation more than 88-90 percent. Titrate oxygen accordingly. - At present, she looks comfortable on the trach collar. - We'll keep her in the ICU at least for tonight. If she goes into respiratory distress, she may end up needing the ventilator. - Continue with vancomycin and ceftazidime for now. - Panculture. - Agree with chest and neck CAT scan to rule out abscess/pneumonia. - Check lactate - Check PCT   HTN - continue home meds   Anxiety/Depression - Continue with Clonazepam, Trazodone, Ativan prn, Lexapro, Buspar   If no issues overnight and is not on the ventilator, may transfer out of ICU in am.   Family :  No family at bedside. I discussed the plan with the patient and she is in agreement.   Pollie Meyer, MD 09/30/2016, 4:53 PM [place] [facility] Pager [phone] After 3 pm or if no answer, call [phone]

## 2016-09-30 NOTE — Procedures (Signed)
Bronchoscopy Procedure Note Yesenia [name] [id number] 01/28/1961  Procedure: Bronchoscopy Indications: Diagnostic evaluation of the airways  Procedure Details Consent: Risks of procedure as well as the alternatives and risks of each were explained to the (patient/caregiver).  Consent for procedure obtained. Time Out: Verified patient identification, verified procedure, site/side was marked, verified correct patient position, special equipment/implants available, medications/allergies/relevent history reviewed, required imaging and test results available.  Performed  In preparation for procedure, patient was given 100% FiO2, bronchoscope lubricated and inhaled beta agonist administered. Sedation: Benzodiazepines  Airway entered and the following bronchi were examined: RUL, RML, RLL, LUL, LLL and Bronchi.   Procedures performed: Brushings performed Bronchoscope removed.    Evaluation Hemodynamic Status: BP stable throughout; O2 sats: stable throughout  Her airways were completely clear and there was no tracheal secretions  Patient's Current Condition: stable Specimens:  None Complications: No apparent complications Patient did tolerate procedure well.   Yesenia [name] 09/30/2016

## 2016-09-30 NOTE — Progress Notes (Signed)
eLink Physician-Brief Progress Note Patient Name: Yesenia [name] DOB: 12-27-1960 MRN: [id number]   Date of Service  09/30/2016  HPI/Events of Note  Pt requesting diet  eICU Interventions  Reg diet     Intervention Category Minor Interventions: Routine modifications to care plan (e.g. PRN medications for pain, fever)  Sandrea HughsMichael Wert 09/30/2016, 8:41 PM

## 2016-09-30 NOTE — Progress Notes (Signed)
Pharmacy Antibiotic Note  [name] is a 56 y.o. female admitted on 09/30/2016 from trach clinic due to progressive SOB, foul-smelling mucous from trach and worsening respiratory distress with anxiety. She will be started on empiric antibiotics while a workup is underway. Currently no labs are available, will enter one time antibiotic doses until labs result.   Plan: -Vancomycin 2500 mg IV x1 -Ceftazidime 2 g IV x1 -Maintenance doses to be entered based on current renal fx -F/u cultures, renal trend, drug levels as indicated     Temp (24hrs), Avg:99.4 F (37.4 C), Min:99.4 F (37.4 C), Max:99.4 F (37.4 C)  No results for input(s): WBC, CREATININE, LATICACIDVEN, VANCOTROUGH, VANCOPEAK, VANCORANDOM, GENTTROUGH, GENTPEAK, GENTRANDOM, TOBRATROUGH, TOBRAPEAK, TOBRARND, AMIKACINPEAK, AMIKACINTROU, AMIKACIN in the last 168 hours.  CrCl cannot be calculated (Patient's most recent lab result is older than the maximum 21 days allowed.).    Allergies  Allergen Reactions  . Buprenorphine Hcl Hives  . Morphine And Related Hives and Dermatitis    Antimicrobials this admission: 3/22 vancomycin > 3/22 ceftazidime >  Dose adjustments this admission: N/A  Microbiology results: 3/22 mrsa pcr:  3/22 blood cx: 3/22 resp cx:  Thank you for allowing pharmacy to be a part of this patient's care.  Baldemar FridayMasters, Kamerin Grumbine M 09/30/2016 3:08 PM

## 2016-09-30 NOTE — Progress Notes (Signed)
Pharmacy Antibiotic Note  Yesenia [name] is a 56 y.o. female admitted on 09/30/2016 from trach clinic due to progressive SOB, foul-smelling mucous from trach and worsening respiratory distress with anxiety. Patient was started on vancomycin and ceftazidime while labs were pending.   Labs are now back with SCr of 1.2 and estimated CrCl ~ 80 mL/min.   Plan: Schedule vancomycin 1250 mg IV every 12 hours per obesity protocol - next dose due 3/23 at 0400.  Schedule ceftazidime 2 g IV every 8 hours.  Maintenance doses to be entered based on current renal fx Follow-up cultures, renal trend, drug levels as indicated  Height: 5\' 6"  (167.6 cm) Weight: (!) 339 lb 4.6 oz (153.9 kg) IBW/kg (Calculated) : 59.3  Temp (24hrs), Avg:99 F (37.2 C), Min:98.5 F (36.9 C), Max:99.4 F (37.4 C)   Recent Labs Lab 09/30/16 1321  WBC 10.6*  CREATININE 1.20*  LATICACIDVEN 3.1*    Estimated Creatinine Clearance: 81.2 mL/min (A) (by C-G formula based on SCr of 1.2 mg/dL (H)).    Allergies  Allergen Reactions  . Buprenorphine Hcl Hives  . Morphine And Related Hives and Dermatitis    Antimicrobials this admission: 3/22 vancomycin > 3/22 ceftazidime >  Dose adjustments this admission: N/A  Microbiology results: 3/22 mrsa pcr: positive 3/22 blood cx: 3/22 resp cx:  Thank you for allowing pharmacy to be a part of this patient's care.  Link SnufferJessica Mikailah Morel, PharmD, BCPS Clinical Pharmacist Clinical Phone 09/30/2016 until 11 PM - [phone] After hours, please call # [phone] 09/30/2016 5:17 PM

## 2016-09-30 NOTE — Progress Notes (Signed)
eLink Physician-Brief Progress Note Patient Name: Yesenia StageSonja Hicklin DOB: [birth year] MRN: [id number]   Date of Service  09/30/2016  HPI/Events of Note  CT neck soft tissue & CT angiogram of the chest report reviewed. No abscess or pulmonary emboli. Questionable granulation tissue versus cellulitis. Central venous catheter within SVC.   eICU Interventions  Continuing current plan of care.      Intervention Category Intermediate Interventions: Diagnostic test evaluation  Lawanda CousinsJennings Nestor 09/30/2016, 11:55 PM

## 2016-10-01 ENCOUNTER — Encounter (HOSPITAL_COMMUNITY): Payer: Self-pay

## 2016-10-01 DIAGNOSIS — G4733 Obstructive sleep apnea (adult) (pediatric): Secondary | ICD-10-CM

## 2016-10-01 DIAGNOSIS — Z930 Tracheostomy status: Secondary | ICD-10-CM

## 2016-10-01 DIAGNOSIS — J9600 Acute respiratory failure, unspecified whether with hypoxia or hypercapnia: Secondary | ICD-10-CM

## 2016-10-01 LAB — PROCALCITONIN

## 2016-10-01 LAB — BASIC METABOLIC PANEL
Anion gap: 10 (ref 5–15)
BUN: 12 mg/dL (ref 6–20)
CO2: 25 mmol/L (ref 22–32)
Calcium: 8.8 mg/dL — ABNORMAL LOW (ref 8.9–10.3)
Chloride: 106 mmol/L (ref 101–111)
Creatinine, Ser: 1.05 mg/dL — ABNORMAL HIGH (ref 0.44–1.00)
GFR calc Af Amer: >60 mL/min (ref >60)
GFR, EST NON AFRICAN AMERICAN: 59 mL/min — AB (ref >60)
GLUCOSE: 104 mg/dL — AB (ref 65–99)
POTASSIUM: 3.5 mmol/L (ref 3.5–5.1)
Sodium: 141 mmol/L (ref 135–145)

## 2016-10-01 LAB — GLUCOSE, CAPILLARY
GLUCOSE-CAPILLARY: 112 mg/dL — AB (ref 65–99)
Glucose-Capillary: 119 mg/dL — ABNORMAL HIGH (ref 65–99)
Glucose-Capillary: 86 mg/dL (ref 65–99)

## 2016-10-01 LAB — CBC
HCT: 35.3 % — ABNORMAL LOW (ref 36.0–46.0)
Hemoglobin: 11.4 g/dL — ABNORMAL LOW (ref 12.0–15.0)
MCH: 27.7 pg (ref 26.0–34.0)
MCHC: 32.3 g/dL (ref 30.0–36.0)
MCV: 85.7 fL (ref 78.0–100.0)
PLATELETS: 307 10*3/uL (ref 150–400)
RBC: 4.12 MIL/uL (ref 3.87–5.11)
RDW: 15 % (ref 11.5–15.5)
WBC: 11.8 10*3/uL — ABNORMAL HIGH (ref 4.0–10.5)

## 2016-10-01 LAB — TROPONIN I: Troponin I: <0.03 ng/mL (ref <0.03)

## 2016-10-01 LAB — PHOSPHORUS: Phosphorus: 3.6 mg/dL (ref 2.5–4.6)

## 2016-10-01 LAB — MAGNESIUM: MAGNESIUM: 2 mg/dL (ref 1.7–2.4)

## 2016-10-01 MED ORDER — GLIPIZIDE ER 2.5 MG PO TB24
2.5000 mg | ORAL_TABLET | Freq: Every day | ORAL | Status: DC
  Administered 2016-10-01 – 2016-10-02 (×2): 2.5 mg via ORAL
  Filled 2016-10-01 (×2): qty 1

## 2016-10-01 MED ORDER — IBUPROFEN 800 MG PO TABS: 800.0000 mg | ORAL_TABLET | Freq: Three times a day (TID) | ORAL | 1 refills | Status: DC | PRN

## 2016-10-01 MED ORDER — AMOXICILLIN-POT CLAVULANATE 875-125 MG PO TABS: 1.0000 | ORAL_TABLET | Freq: Two times a day (BID) | ORAL | 0 refills | Status: AC

## 2016-10-01 MED ORDER — PANTOPRAZOLE SODIUM 40 MG PO TBEC
40.0000 mg | DELAYED_RELEASE_TABLET | Freq: Every day | ORAL | Status: DC
  Administered 2016-10-01 – 2016-10-02 (×2): 40 mg via ORAL
  Filled 2016-10-01 (×2): qty 1

## 2016-10-01 MED ORDER — ALBUTEROL SULFATE (2.5 MG/3ML) 0.083% IN NEBU: 2.5000 mg | INHALATION_SOLUTION | RESPIRATORY_TRACT | Status: DC | PRN

## 2016-10-01 MED ORDER — BENZONATATE 100 MG PO CAPS: 200.0000 mg | ORAL_CAPSULE | Freq: Three times a day (TID) | ORAL | 0 refills | Status: DC | PRN

## 2016-10-01 MED ORDER — AMOXICILLIN-POT CLAVULANATE 875-125 MG PO TABS
1.0000 | ORAL_TABLET | Freq: Two times a day (BID) | ORAL | Status: DC
  Administered 2016-10-01 – 2016-10-02 (×3): 1 via ORAL
  Filled 2016-10-01 (×3): qty 1

## 2016-10-01 MED ORDER — IBUPROFEN 800 MG PO TABS: 800.0000 mg | ORAL_TABLET | Freq: Three times a day (TID) | ORAL | 3 refills | Status: DC | PRN

## 2016-10-01 MED ORDER — BENZONATATE 100 MG PO CAPS: 200.0000 mg | ORAL_CAPSULE | Freq: Three times a day (TID) | ORAL | 1 refills | Status: DC | PRN

## 2016-10-01 NOTE — Progress Notes (Signed)
RT note- Patient checked prior to leaving the unit, transferred to 3w

## 2016-10-01 NOTE — Progress Notes (Signed)
PULMONARY / CRITICAL CARE MEDICINE   Name: Yesenia [name] MRN: [id number] DOB: June 09, 1961    ADMISSION DATE:  09/30/2016 CONSULTATION DATE:  3/22  REFERRING MD:  trach clinic   CHIEF COMPLAINT:  Acute hypoxic respiratory failure   HISTORY OF PRESENT ILLNESS:    This is a 56 year old female followed by PB at trach clinic w/ chronic trach for definitive treatment of OSA. She was referred initially to the trach clinic by Dr Jenne Pane w/ ENT for chronic trach related discomfort. Seen last in our clinic on 1/10 at which time she reported significant discomfort at trach stoma site. At that time on exam she had small amount of Keloid granulation tissue around trach stoma site. We changed her to a portex XL trach which seemed to relieve the discomfort almost immediately. She presented to the trach clinic as routine 8 week f/u on 3/22. On arrival she was found to be in acute distress. She notes that her neck discomfort got worse initially about a week after our last visit. She does not recall fever or sick exposure. She notes over the last 3-4 weeks she has had worsening shortness of breath and at times a thick fouls smelling mucous "bubble" from around her trach starting about a week ago.  She denies: HA, nasal congestion. The pain is primarily localized at the stoma site w/ a large keloid collection at about 6 o'clock. w/ what appears to be a small sinus at the bottom of the keloid changes. On examination A small amount of fouls smelling discharge was expelled from this sinus. Her saturations were in 80s initially and she became progressively anxious w/ worsening respiratory distress and associated anxiety so it was decided that she would need to be admitted to further evaluate the etiology of her distress.   SUBJECTIVE:  SOB much improved.  Denies any chest pain.  Does have intermittent pain at trach site and continues to describe "bubbling".  No discharge noted from trach site. CT neck and chest reviewed > neg  for PE and / or abscess.  Did show some surrounding soft tissue density that was felt to most likely reflect granulation tissue; though, superimposed cellulitis difficult to exclude.   VITAL SIGNS: BP 117/79   Pulse 65   Temp 98.2 F (36.8 C) (Oral)   Resp 17   Ht 5\' 6"  (1.676 m)   Wt (!) 155.2 kg (342 lb 2.5 oz)   SpO2 100%   BMI 55.23 kg/m   HEMODYNAMICS:    VENTILATOR SETTINGS: FiO2 (%):  [28 %] 28 %  INTAKE / OUTPUT: I/O last 3 completed shifts: In: 2550 [P.O.:240; I.V.:1410; IV Piggyback:900] Out: -   Physical Exam: General: Adult female, in NAD. Neuro: A&O x 3, no focal deficits. HEENT: Trach in place and C/D/I.  On ATC. Cardiovascular: RRR, no M/R/G. Lungs: Normal respiratory effort.  Clear bilaterally. Abdomen: Obese.  BS x 4, S/NT/ND. Musculoskeletal: No deformities or edema. Skin: Warm, dry.   LABS:  BMET  Recent Labs Lab 09/30/16 1321 10/01/16 0416  NA 140 141  K 3.7 3.5  CL 107 106  CO2 21* 25  BUN 13 12  CREATININE 1.20* 1.05*  GLUCOSE 101* 104*    Electrolytes  Recent Labs Lab 09/30/16 1321 10/01/16 0416  CALCIUM 9.4 8.8*  MG 1.8 2.0  PHOS 1.1* 3.6    CBC  Recent Labs Lab 09/30/16 1321 10/01/16 0416  WBC 10.6* 11.8*  HGB 12.5 11.4*  HCT 37.9 35.3*  PLT 332  307    Coag's  Recent Labs Lab 09/30/16 1321  INR 1.16    Sepsis Markers  Recent Labs Lab 09/30/16 1321 09/30/16 1500 09/30/16 1840 10/01/16 0416  LATICACIDVEN 3.1*  --  2.6*  --   PROCALCITON  --  <0.10  --  <0.10    ABG No results for input(s): PHART, PCO2ART, PO2ART in the last 168 hours.  Liver Enzymes  Recent Labs Lab 09/30/16 1321  AST 22  ALT 18  ALKPHOS 81  BILITOT 0.8  ALBUMIN 3.5    Cardiac Enzymes  Recent Labs Lab 09/30/16 1321 09/30/16 1840 10/01/16 0100  TROPONINI <0.03 <0.03 <0.03    Glucose  Recent Labs Lab 09/30/16 1353 09/30/16 1603 09/30/16 1931 09/30/16 2348 10/01/16 0325 10/01/16 0736  GLUCAP 116* 76  99 134* 119* 112*    Imaging Ct Soft Tissue Neck W Contrast  Result Date: 09/30/2016 CLINICAL DATA:  Initial evaluation for possible abscess about tracheostomy. EXAM: CT NECK WITH CONTRAST TECHNIQUE: Multidetector CT imaging of the neck was performed using the standard protocol following the bolus administration of intravenous contrast. CONTRAST:  100 cc of Isovue 370. COMPARISON:  Prior CT from 02/22/2016. FINDINGS: Pharynx and larynx: Oral cavity within normal limits without mass lesion or loculated fluid collection. No acute abnormality about the dentition. Palatine tonsils symmetric and normal. Parapharyngeal fat preserved. Nasopharynx within normal limits. Epiglottis normal. Vallecula of partially effaced by the lingual tonsils, but grossly clear. Retropharyngeal soft tissues within normal limits. Hypopharynx and supraglottic larynx within normal limits. True cords symmetric and normal. Tracheostomy tube in place within the subglottic trachea. Tube appears appropriately positioned. Surrounding soft tissue density about the tracheostomy, likely largely reflecting granulation tissue. No loculated collection to suggest abscess. No significant inflammatory changes about the tube itself, although a superimposed cellulitis would be difficult to exclude. Salivary glands: Salivary glands including the parotid and submandibular glands are within normal limits. Thyroid: Thyroid within normal limits. Lymph nodes: No pathologically enlarged lymph nodes identified within the neck. Vascular: Normal intravascular enhancement seen throughout the neck. Carotid arteries are medialized into the retropharyngeal space. Limited intracranial: Unremarkable. Visualized orbits: The globes and orbits within normal limits. Mastoids and visualized paranasal sinuses: Mild mucosal thickening within the inferior maxillary sinuses. Paranasal sinuses are otherwise clear. No mastoid effusion. Middle ear cavities are well pneumatized.  Skeleton: No acute osseus abnormality. Multilevel degenerative spondylolysis noted within the cervical spine. Upper chest: Visualized upper mediastinum within normal limits. Partially visualized lungs are clear. Other: No other significant finding. IMPRESSION: 1. Tracheostomy tube in place within the subglottic trachea. Surrounding soft tissue density felt to most likely reflect granulation tissue for the most part. Superimposed cellulitis would be difficult to exclude, and could be considered in the correct clinical setting. No abscess or drainable fluid collection identified. 2. No other acute abnormality within the neck. Electronically Signed   By: Rise Mu M.D.   On: 09/30/2016 19:54   Ct Angio Chest Pe W Or Wo Contrast  Result Date: 09/30/2016 CLINICAL DATA:  Worsening shortness of breath over the past 3-4 weeks with foul-smelling mucus around tracheostomy beginning 1 week ago. EXAM: CT ANGIOGRAPHY CHEST WITH CONTRAST TECHNIQUE: Multidetector CT imaging of the chest was performed using the standard protocol during bolus administration of intravenous contrast. Multiplanar CT image reconstructions and MIPs were obtained to evaluate the vascular anatomy. CONTRAST:  100 mL Isovue 370 IV. COMPARISON:  03/18/2016 and 02/22/2016 FINDINGS: Cardiovascular: Mild cardiomegaly. Thoracic aorta is within normal. Pulmonary arterial system is adequately  opacified without evidence of emboli. Mediastinum/Nodes: Tracheostomy tube appears in adequate position. Right-sided central venous catheter has tip over the SVC. No evidence of mediastinal or hilar adenopathy. Remaining mediastinal structures are within normal. Lungs/Pleura: Lungs are adequately inflated with subtle patchy hazy airspace density over the right lung vessels suggestion of bibasilar dependent atelectasis. No evidence of effusion. Airways are within normal. Upper Abdomen: Unchanged. Musculoskeletal: Minimal degenerate change of the spine. Review of  the MIP images confirms the above findings. IMPRESSION: No evidence of pulmonary embolism. Subtle hazy patchy airspace density over the right lung which may be due to mild asymmetric edema, atelectasis or early infection. Tracheostomy tube in adequate position. Right-sided venous catheter with tip over the SVC. Stable cardiomegaly. Electronically Signed   By: Elberta Fortisaniel  Boyle M.D.   On: 09/30/2016 18:28     STUDIES:  CTA chest / CT neck 3/22 > neg for PE and / or abscess.  Some surrounding soft tissue density that is felt to most likely reflect granulation tissue; though, superimposed cellulitis difficult to exclude.  CULTURES: Sputum 3/22 >   ANTIBIOTICS: fortaz 3/22 > 3/23 vanc 3/22 > 3/23 Augmentin 3/23 > (5 day course)  SIGNIFICANT EVENTS: 3/22 > admit 3/23 > transfer to SDU, ENT consulted  LINES/TUBES: Trach Aug 2017 >   Impression/Plan:  Respiratory failure, acute (HCC).  Etiology not clear yet though favor largely exacerbated acutely by anxiety and discomfort from granulation tissue surrounding trach site.  PNA seems unlikely as no secretions in airway noted during bronch and PCT / WBC / fever curve reassuring. 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 Follow sputum cultures. Consult ENT for further eval / recs on granulation tissue. D/c vanc / fortaz - switch to PO augmentin incase of underlying cellulitis (would treat empirically x 5 days). Follow PCT - so far negative and reassuring. F/u with trach clinic in 12 weeks (already scheduled).  Anxiety  h/o fibromyalgia Plan Cont Buspar, Klonopin, lexapro and desyrel.  Essential hypertension Plan Cont atenolol.   Stage 3 chronic kidney disease: last Cr 1.1 about 3 mo ago Plan Follow chemistry.  Type 2 diabetes mellitus  Plan D/c SSI. Start back preadmission glipizide.  GERD (gastroesophageal reflux  disease) Plan PPI.  Iron deficiency anemia Plan Cont supplementation.   DISCUSSION:  CT shows no abscess but can not rule out cellulitis; therefore, will place on PO augmentin. Granulation tissue noted > needs ENT consult (I have called them).  [facility] ICU [facility]  PCCM will follow up on Monday 3/26.   Rutherford Guysahul Desai, [place] - [facility] [place] [facility] Pager: [phone]  or [phone] 10/01/2016, 9:55 AM  Attending Note:  I have examined patient, reviewed labs, studies and notes. I have discussed the case with Ihor Dow Desai, and I agree with the data and plans as amended above.   Pleasant 56 year old woman with OSA/OHS status post tracheostomy by Dr Jenne PaneBates. She has some associated peristomal granulation tissue. She was changed to a Portex trach recently and there is some plan to ultimately convert to a [place] trach. She has pain at that site. 3/22 she was being evaluated in trach clinic and had associated acute dyspnea. She was monitored overnight in the ICU. On my evaluation this morning she is much improved. More comfortable. Able to interact, follow commands. Lungs are  distant but clear. Dr. Jenne Pane has evaluated her tracheostomy site. A CT scan of the neck did not show any evidence of fistula or abscess. CT scan of the chest did not show any significant infiltrates. We will plan to treat her for cellulitis at her stoma site. She will follow-up in trach clinic and with Dr. Jenne Pane as an outpatient   Levy Pupa, MD, PhD 10/01/2016, 12:17 PM [facility] [phone] or if no answer [phone]

## 2016-10-01 NOTE — Discharge Summary (Addendum)
Physician Discharge Summary       Patient ID: Manroop Jakubowicz MRN: [id number] DOB/AGE: December 25, 1960 56 y.o.  Admit date: 09/30/2016 Discharge date: 10/02/2016  Discharge Diagnoses:  Principal Problem:   Acute on chronic respiratory failure with hypoxemia [facility]) Active Problems:   Essential hypertension   Type 2 diabetes mellitus with stage 3 chronic kidney disease, without long-term current use of insulin (HCC)   Morbid (severe) obesity due to excess calories (HCC) complicated by OSA   GERD (gastroesophageal reflux disease)   OSA (obstructive sleep apnea)   Fibromyalgia   Tracheostomy dependent (HCC)   Iron deficiency anemia   Acute tracheitis   Detailed Hospital Course:  56 year old female who presented to the trach clinic on 3/22 for routine trach f/u. She has a significant h/o OSA which is why she has the trach. We recently had changed her to Portex trach for chronic trach discomfort, Post surgery noted  hypertrophic scar that developed thru the healing process. She presented  to the trach clinic with report that her neck again was uncomfortable and she was getting progressively short of breath. Her work of breathing rapidly escalated and although there appeared to be a marked psycho-somatic component we felt it necessary to rule out neck abscess and primary pulmonary or cardiac pathology. She reported occasional yellow secretions from the trach site and was very painful to touch.   On arrival to ICU we did bedside bronchoscopy. The airways were completely clear and w/out evidence of mucous or infection/inflammation. Next she had CT of chest and neck. These as well were both negative and did not explain her symptoms. We did place her on antibiotics. Her WBC ct and PCT were re-assuring.  She had been seen by Dr Karlyn Agee so we called him to see her. He felt that this was primarily hypertrophic scar tissue and recommended treating it w/ anti-inflammatory therapy and have her f/u with him in a  couple weeks w/ plans to place [place] trach.   In summary we feel that the pain is well explained as above. The dyspnea was NOT related to primary chest pathology, neck abscess OR cardiac issues & seemingly exacerbated by anxiety. She is now clear for dc and she will be discharged to home w/ the plan as outlined below.     Discharge Plan by active problems  Chronic trach w/ Acute on chronic stoma site pain due to hypertrophic scars.  Plan f/u w/ dr Jenne Pane in 2-3 weeks Will dc to home on ibuprofen  Eventually try [place]gomery trach  Will complete 5d augmentin   Anxiety/fibromyalgia  Plan Cont buspar, klonopin, lexapro and desaryl   Essential HTN Plan Atenolol   Stage III CKD  Plan f/u w/ PCP  DM Plan Resume home rex   GERD Plan Cont home rx   FESO4 def anemia Plan Cont supplementation   Significant Hospital tests/ studies  Consults: ENT   Discharge Exam: BP 134/73   Pulse 80   Temp 98.5 F (36.9 C) (Oral)   Resp 19   Ht 5\' 6"  (1.676 m)   Wt (!) 333 lb 8 oz (151.3 kg) Comment: Pt. refused a standing weight  SpO2 100%   BMI 53.83 kg/m   General: awake, alert no focal def  Neuro: awake and alert no focal def  HEENT:trach unremarkable. Small dime sized hypertrophic scar  Cardiovascular: RRR, no M/R/G. Lungs: Normal respiratory effort.  Clear bilaterally. Abdomen: Obese.  BS x 4, S/NT/ND. Musculoskeletal: No deformities or edema. Skin: Warm,  dry.   Labs at discharge Lab Results  Component Value Date   CREATININE 1.05 (H) 10/01/2016   BUN 12 10/01/2016   NA 141 10/01/2016   K 3.5 10/01/2016   CL 106 10/01/2016   CO2 25 10/01/2016   Lab Results  Component Value Date   WBC 11.8 (H) 10/01/2016   HGB 11.4 (L) 10/01/2016   HCT 35.3 (L) 10/01/2016   MCV 85.7 10/01/2016   PLT 307 10/01/2016   Lab Results  Component Value Date   ALT 18 09/30/2016   AST 22 09/30/2016   ALKPHOS 81 09/30/2016   BILITOT 0.8 09/30/2016   Lab Results  Component Value  Date   INR 1.16 09/30/2016   INR 1.26 03/07/2016   INR 1.11 09/24/2014    Current radiology studies Ct Soft Tissue Neck W Contrast  Result Date: 09/30/2016 CLINICAL DATA:  Initial evaluation for possible abscess about tracheostomy. EXAM: CT NECK WITH CONTRAST TECHNIQUE: Multidetector CT imaging of the neck was performed using the standard protocol following the bolus administration of intravenous contrast. CONTRAST:  100 cc of Isovue 370. COMPARISON:  Prior CT from 02/22/2016. FINDINGS: Pharynx and larynx: Oral cavity within normal limits without mass lesion or loculated fluid collection. No acute abnormality about the dentition. Palatine tonsils symmetric and normal. Parapharyngeal fat preserved. Nasopharynx within normal limits. Epiglottis normal. Vallecula of partially effaced by the lingual tonsils, but grossly clear. Retropharyngeal soft tissues within normal limits. Hypopharynx and supraglottic larynx within normal limits. True cords symmetric and normal. Tracheostomy tube in place within the subglottic trachea. Tube appears appropriately positioned. Surrounding soft tissue density about the tracheostomy, likely largely reflecting granulation tissue. No loculated collection to suggest abscess. No significant inflammatory changes about the tube itself, although a superimposed cellulitis would be difficult to exclude. Salivary glands: Salivary glands including the parotid and submandibular glands are within normal limits. Thyroid: Thyroid within normal limits. Lymph nodes: No pathologically enlarged lymph nodes identified within the neck. Vascular: Normal intravascular enhancement seen throughout the neck. Carotid arteries are medialized into the retropharyngeal space. Limited intracranial: Unremarkable. Visualized orbits: The globes and orbits within normal limits. Mastoids and visualized paranasal sinuses: Mild mucosal thickening within the inferior maxillary sinuses. Paranasal sinuses are otherwise  clear. No mastoid effusion. Middle ear cavities are well pneumatized. Skeleton: No acute osseus abnormality. Multilevel degenerative spondylolysis noted within the cervical spine. Upper chest: Visualized upper mediastinum within normal limits. Partially visualized lungs are clear. Other: No other significant finding. IMPRESSION: 1. Tracheostomy tube in place within the subglottic trachea. Surrounding soft tissue density felt to most likely reflect granulation tissue for the most part. Superimposed cellulitis would be difficult to exclude, and could be considered in the correct clinical setting. No abscess or drainable fluid collection identified. 2. No other acute abnormality within the neck. Electronically Signed   By: Rise MuBenjamin  McClintock M.D.   On: 09/30/2016 19:54   Ct Angio Chest Pe W Or Wo Contrast  Result Date: 09/30/2016 CLINICAL DATA:  Worsening shortness of breath over the past 3-4 weeks with foul-smelling mucus around tracheostomy beginning 1 week ago. EXAM: CT ANGIOGRAPHY CHEST WITH CONTRAST TECHNIQUE: Multidetector CT imaging of the chest was performed using the standard protocol during bolus administration of intravenous contrast. Multiplanar CT image reconstructions and MIPs were obtained to evaluate the vascular anatomy. CONTRAST:  100 mL Isovue 370 IV. COMPARISON:  03/18/2016 and 02/22/2016 FINDINGS: Cardiovascular: Mild cardiomegaly. Thoracic aorta is within normal. Pulmonary arterial system is adequately opacified without evidence of emboli. Mediastinum/Nodes: Tracheostomy  tube appears in adequate position. Right-sided central venous catheter has tip over the SVC. No evidence of mediastinal or hilar adenopathy. Remaining mediastinal structures are within normal. Lungs/Pleura: Lungs are adequately inflated with subtle patchy hazy airspace density over the right lung vessels suggestion of bibasilar dependent atelectasis. No evidence of effusion. Airways are within normal. Upper Abdomen:  Unchanged. Musculoskeletal: Minimal degenerate change of the spine. Review of the MIP images confirms the above findings. IMPRESSION: No evidence of pulmonary embolism. Subtle hazy patchy airspace density over the right lung which may be due to mild asymmetric edema, atelectasis or early infection. Tracheostomy tube in adequate position. Right-sided venous catheter with tip over the SVC. Stable cardiomegaly. Electronically Signed   By: Elberta Fortis M.D.   On: 09/30/2016 18:28    Disposition:  Home    Allergies as of 10/01/2016      Reactions   Buprenorphine Hcl Hives   Morphine And Related Hives, Dermatitis      Medication List    STOP taking these medications   clindamycin 150 MG capsule Commonly known as:  CLEOCIN   glipiZIDE 2.5 MG 24 hr tablet Commonly known as:  GLUCOTROL XL   hydrochlorothiazide 12.5 MG tablet Commonly known as:  HYDRODIURIL   HYDROcodone-acetaminophen 5-325 MG tablet Commonly known as:  NORCO/VICODIN     TAKE these medications   acetaminophen-codeine 300-30 MG tablet Commonly known as:  TYLENOL #3 Take 1 tablet by mouth every 6 (six) hours as needed for pain. for pain   albuterol 108 (90 Base) MCG/ACT inhaler Commonly known as:  PROVENTIL HFA;VENTOLIN HFA Inhale 2 puffs into the lungs every 6 (six) hours as needed for wheezing.   amLODipine 10 MG tablet Commonly known as:  NORVASC Take 10 mg by mouth daily.   amoxicillin-clavulanate 875-125 MG tablet Commonly known as:  AUGMENTIN Take 1 tablet by mouth every 12 (twelve) hours.   atenolol 50 MG tablet Commonly known as:  TENORMIN Take 50 mg by mouth daily.   benzonatate 100 MG capsule Commonly known as:  TESSALON PERLES Take 2 capsules (200 mg total) by mouth 3 (three) times daily as needed for cough.   busPIRone 15 MG tablet Commonly known as:  BUSPAR Take 15 mg by mouth 2 (two) times daily.   cholestyramine 4 g packet Commonly known as:  QUESTRAN Take 4 g by mouth daily.     clonazePAM 1 MG tablet Commonly known as:  KLONOPIN Take 1 tablet (1 mg total) by mouth at bedtime.   diphenhydrAMINE-zinc acetate cream Commonly known as:  BENADRYL Apply topically 2 (two) times daily as needed for itching. For 2-3days   escitalopram 10 MG tablet Commonly known as:  LEXAPRO Take 10 mg by mouth daily.   ferrous sulfate 325 (65 FE) MG tablet Take 325 mg by mouth daily.   gabapentin 300 MG capsule Commonly known as:  NEURONTIN Take 300 mg by mouth at bedtime.   ibuprofen 800 MG tablet Commonly known as:  ADVIL,MOTRIN Take 1 tablet (800 mg total) by mouth every 8 (eight) hours as needed for mild pain.   traMADol 50 MG tablet Commonly known as:  ULTRAM Take 1 tablet (50 mg total) by mouth every 6 (six) hours as needed for moderate pain or severe pain.   traZODone 150 MG tablet Commonly known as:  DESYREL Take 150 mg by mouth at bedtime.      Follow-up Information    BATES, DWIGHT, MD. Schedule an appointment as soon as possible for a  visit on 10/26/2016.   Specialty:  Otolaryngology Why:  320pm  Contact information: [address] [place] [address] [phone]           Discharged Condition: good  Physician Statement:   The Patient was personally examined, the discharge assessment and plan has been personally reviewed and I agree with ACNP Babcock's assessment and plan. > 30 minutes of time have been dedicated to discharge assessment, planning and discharge instructions.  Pt has humdified 02 for trach collar at home and wants to go home today, no concerns verbalized   Sandrea Hughs, MD Pulmonary and Critical Care Medicine [facility] Cell [phone] After 5:30 PM or weekends, use [facility] [phone]

## 2016-10-01 NOTE — Progress Notes (Signed)
   Subjective:    Patient ID: Yesenia [name], female    DOB: 1960-09-18, 56 y.o.   MRN: [id number]  HPI  Patient known to me with long-term tracheostomy for severe obstructive sleep apnea.  Visit requested due to granulation at trach site.  She has been complaining of pain at the trach site for several weeks.  Her tube was changed to a Portex tube by the trach clinic with some initial improvement.  However, pain has returned.  She was admitted yesterday due to acute respiratory distress and treated in the ICU overnight.  Today, she is feeling much better and is transferring to a regular room.  She was placed on antibiotics and a neck CT was performed along with a chest CT and there was no abscess in the neck.  Review of Systems     Objective:   Physical Exam Alert, NAD. #6 Porex trach tube in place.  Hypertrophic scar inferior to stoma, no significant granulation tissue.  Area is tender but not erythematous.     Assessment & Plan:  Tracheostomy status, obstructive sleep apnea, hypertrophic scar at stoma  Her stoma has hypertrophic scar inferiorly that developed during healing from the trach tube placement and continues to cause her pain.  I do not see any signs of infection.  I recommend anti-inflammatory therapy and will have her follow-up with me in a couple of weeks.  I have planned to place a Montgomery trach tube to simplify her setup but have not been able to get her to my office in a stable enough situation.  I do not believe that her stomal issues had anything to do with her respiratory distress.

## 2016-10-01 NOTE — Progress Notes (Signed)
Patient has not voided since last documented at 4am. She does not have any complaints of feeling like she needs to void. She states that she has not taken in much fluids. Bladder scan done and it showed 21 cc volume and was repeated a couple of times and this amount was consistent. I gave the patient two cups of water and encouraged her to drink. She stated that she would drink more. Dr. Robb Matarrtiz notified of this and no new orders received. Will continue to monitor I/O status and encourage fluids.

## 2016-10-02 ENCOUNTER — Other Ambulatory Visit: Payer: Self-pay | Admitting: Adult Health

## 2016-10-02 LAB — BASIC METABOLIC PANEL
Anion gap: 9 (ref 5–15)
BUN: 13 mg/dL (ref 6–20)
CO2: 26 mmol/L (ref 22–32)
CREATININE: 0.99 mg/dL (ref 0.44–1.00)
Calcium: 8.7 mg/dL — ABNORMAL LOW (ref 8.9–10.3)
Chloride: 109 mmol/L (ref 101–111)
Glucose, Bld: 107 mg/dL — ABNORMAL HIGH (ref 65–99)
POTASSIUM: 3.6 mmol/L (ref 3.5–5.1)
SODIUM: 144 mmol/L (ref 135–145)

## 2016-10-02 LAB — CBC
HCT: 33.2 % — ABNORMAL LOW (ref 36.0–46.0)
HEMOGLOBIN: 10.7 g/dL — AB (ref 12.0–15.0)
MCH: 27.9 pg (ref 26.0–34.0)
MCHC: 32.2 g/dL (ref 30.0–36.0)
MCV: 86.5 fL (ref 78.0–100.0)
PLATELETS: 286 10*3/uL (ref 150–400)
RBC: 3.84 MIL/uL — ABNORMAL LOW (ref 3.87–5.11)
RDW: 15.2 % (ref 11.5–15.5)
WBC: 8.1 10*3/uL (ref 4.0–10.5)

## 2016-10-02 LAB — GLUCOSE, CAPILLARY: Glucose-Capillary: 87 mg/dL (ref 65–99)

## 2016-10-02 LAB — PROCALCITONIN

## 2016-10-02 MED ORDER — FUROSEMIDE 10 MG/ML IJ SOLN: 40.0000 mg | Freq: Once | INTRAMUSCULAR | Status: DC

## 2016-10-02 MED ORDER — DIPHENHYDRAMINE-ZINC ACETATE 2-0.1 % EX CREA
TOPICAL_CREAM | Freq: Four times a day (QID) | CUTANEOUS | Status: DC | PRN
  Administered 2016-10-02: 09:00:00 via TOPICAL
  Filled 2016-10-02: qty 28

## 2016-10-02 MED ORDER — POTASSIUM CHLORIDE 20 MEQ/15ML (10%) PO SOLN
40.0000 meq | Freq: Once | ORAL | Status: AC
  Administered 2016-10-02: 40 meq via ORAL
  Filled 2016-10-02: qty 30

## 2016-10-02 MED ORDER — FUROSEMIDE 10 MG/ML IJ SOLN
60.0000 mg | Freq: Once | INTRAMUSCULAR | Status: AC
  Administered 2016-10-02: 60 mg via INTRAVENOUS
  Filled 2016-10-02: qty 6

## 2016-10-02 MED ORDER — DIPHENHYDRAMINE-ZINC ACETATE 2-0.1 % EX CREA: TOPICAL_CREAM | Freq: Four times a day (QID) | CUTANEOUS | 0 refills | Status: DC | PRN

## 2016-10-02 NOTE — Progress Notes (Signed)
Pt states states that the long tapered end of the PMV broke off yesterday during respiratory therapist care. Pt states she needs a new PMV because she might lose it if it cannot be secured to the tracheostomy tie when in use.  SLP called to request for a new PMV. I was told by the therapist that they need to re-fit the patient for the new PMV before pt can receive a new PMV, and there is no one here today trained to do the PMV fitting. The therapist said she will make some phone calls, and call me back.  Pt has been updated.

## 2016-10-02 NOTE — Progress Notes (Signed)
SLP Cancellation Note  Patient Details Name: Yesenia [name] Ivey MRN: [id number] DOB: 1960/09/08   Cancelled treatment:       Reason Eval/Treat Not Completed: SLP screened, no needs identified, will sign off. Pt with PMV already, requesting attachment cord for trach. SLP provided, no evaluation necessary. SLP will s/o.  Rondel BatonMary Beth Hrithik Boschee, [place] CF-SLP Speech-Language Pathologist [phone]   Arlana LindauMary E Mahrukh Seguin 10/02/2016, 1:21 PM

## 2016-10-02 NOTE — Progress Notes (Signed)
Pt now has her new PMV

## 2016-10-02 NOTE — Progress Notes (Signed)
Charge nurse discussed d/c instructions with patient and her daughter, and they verbalized understanding. Pt via d/c WC accompanied by her daughter.

## 2016-10-03 LAB — CULTURE, RESPIRATORY W GRAM STAIN

## 2016-10-03 LAB — CULTURE, RESPIRATORY

## 2016-10-04 ENCOUNTER — Telehealth: Payer: Self-pay | Admitting: Acute Care

## 2016-10-04 ENCOUNTER — Other Ambulatory Visit: Payer: Self-pay | Admitting: Acute Care

## 2016-10-04 DIAGNOSIS — J0410 Acute tracheitis without obstruction: Secondary | ICD-10-CM

## 2016-10-04 LAB — GLUCOSE, CAPILLARY
GLUCOSE-CAPILLARY: 125 mg/dL — AB (ref 65–99)
Glucose-Capillary: 78 mg/dL (ref 65–99)
Glucose-Capillary: 109 mg/dL — ABNORMAL HIGH (ref 65–99)

## 2016-10-04 MED ORDER — DOXYCYCLINE HYCLATE 100 MG PO TABS: 100.0000 mg | ORAL_TABLET | Freq: Two times a day (BID) | ORAL | Status: AC

## 2016-10-05 ENCOUNTER — Telehealth: Payer: Self-pay | Admitting: Acute Care

## 2016-10-05 LAB — CULTURE, BLOOD (ROUTINE X 2)
CULTURE: NO GROWTH
Culture: NO GROWTH

## 2016-10-05 NOTE — Progress Notes (Signed)
Received a telephone call from patient stating she was called yesterday by Anders SimmondsPete Babcock NP, whom stated her antibiotics needed to be changed.  Patient stated her pharmacy never received new prescription.  Anders SimmondsPete Babcock NP paged and notified. Stated he would call in prescriptions.  Patient called and updated.  Colman Caterarpley, Jesiah Yerby Danielle

## 2016-10-13 ENCOUNTER — Ambulatory Visit (HOSPITAL_COMMUNITY): Payer: Medicaid Other

## 2016-12-17 ENCOUNTER — Other Ambulatory Visit: Payer: Self-pay | Admitting: Adult Health

## 2016-12-22 ENCOUNTER — Inpatient Hospital Stay (HOSPITAL_COMMUNITY): Admit: 2016-12-22 | Payer: Medicaid Other

## 2016-12-23 ENCOUNTER — Other Ambulatory Visit: Payer: Self-pay | Admitting: Adult Health

## 2016-12-28 ENCOUNTER — Other Ambulatory Visit: Payer: Self-pay | Admitting: Adult Health

## 2017-02-06 ENCOUNTER — Encounter (HOSPITAL_COMMUNITY): Payer: Self-pay | Admitting: Emergency Medicine

## 2017-02-06 ENCOUNTER — Emergency Department (HOSPITAL_COMMUNITY)
Admission: EM | Admit: 2017-02-06 | Discharge: 2017-02-06 | Disposition: A | Discharge status: Home / Self Care | Payer: Medicaid Other | Attending: Emergency Medicine | Admitting: Emergency Medicine

## 2017-02-06 ENCOUNTER — Emergency Department (HOSPITAL_COMMUNITY): Payer: Medicaid Other

## 2017-02-06 DIAGNOSIS — I129 Hypertensive chronic kidney disease with stage 1 through stage 4 chronic kidney disease, or unspecified chronic kidney disease: Secondary | ICD-10-CM | POA: Diagnosis not present

## 2017-02-06 DIAGNOSIS — N183 Chronic kidney disease, stage 3 (moderate): Secondary | ICD-10-CM | POA: Diagnosis not present

## 2017-02-06 DIAGNOSIS — F419 Anxiety disorder, unspecified: Secondary | ICD-10-CM | POA: Diagnosis not present

## 2017-02-06 DIAGNOSIS — L03221 Cellulitis of neck: Secondary | ICD-10-CM | POA: Insufficient documentation

## 2017-02-06 DIAGNOSIS — Z430 Encounter for attention to tracheostomy: Secondary | ICD-10-CM

## 2017-02-06 DIAGNOSIS — J9509 Other tracheostomy complication: Secondary | ICD-10-CM | POA: Diagnosis present

## 2017-02-06 DIAGNOSIS — Z8673 Personal history of transient ischemic attack (TIA), and cerebral infarction without residual deficits: Secondary | ICD-10-CM | POA: Diagnosis not present

## 2017-02-06 DIAGNOSIS — E1122 Type 2 diabetes mellitus with diabetic chronic kidney disease: Secondary | ICD-10-CM | POA: Insufficient documentation

## 2017-02-06 LAB — CBC WITH DIFFERENTIAL/PLATELET
BASOS ABS: 0 10*3/uL (ref 0.0–0.1)
BASOS PCT: 0 %
EOS ABS: 0.2 10*3/uL (ref 0.0–0.7)
EOS PCT: 2 %
HCT: 36.7 % (ref 36.0–46.0)
Hemoglobin: 12.1 g/dL (ref 12.0–15.0)
LYMPHS ABS: 2.5 10*3/uL (ref 0.7–4.0)
Lymphocytes Relative: 27 %
MCH: 27.2 pg (ref 26.0–34.0)
MCHC: 33 g/dL (ref 30.0–36.0)
MCV: 82.5 fL (ref 78.0–100.0)
Monocytes Absolute: 0.6 10*3/uL (ref 0.1–1.0)
Monocytes Relative: 7 %
NEUTROS PCT: 64 %
Neutro Abs: 5.8 10*3/uL (ref 1.7–7.7)
PLATELETS: 286 10*3/uL (ref 150–400)
RBC: 4.45 MIL/uL (ref 3.87–5.11)
RDW: 13.7 % (ref 11.5–15.5)
WBC: 9.1 10*3/uL (ref 4.0–10.5)

## 2017-02-06 LAB — BASIC METABOLIC PANEL
Anion gap: 10 (ref 5–15)
BUN: 13 mg/dL (ref 6–20)
CHLORIDE: 106 mmol/L (ref 101–111)
CO2: 26 mmol/L (ref 22–32)
Calcium: 8.8 mg/dL — ABNORMAL LOW (ref 8.9–10.3)
Creatinine, Ser: 1.03 mg/dL — ABNORMAL HIGH (ref 0.44–1.00)
Glucose, Bld: 132 mg/dL — ABNORMAL HIGH (ref 65–99)
Potassium: 3.3 mmol/L — ABNORMAL LOW (ref 3.5–5.1)
SODIUM: 142 mmol/L (ref 135–145)

## 2017-02-06 MED ORDER — IOPAMIDOL (ISOVUE-300) INJECTION 61%
INTRAVENOUS | Status: AC
  Administered 2017-02-06: 75 mL
  Filled 2017-02-06: qty 75

## 2017-02-06 MED ORDER — FENTANYL CITRATE (PF) 100 MCG/2ML IJ SOLN
50.0000 ug | Freq: Once | INTRAMUSCULAR | Status: AC
  Administered 2017-02-06: 50 ug via INTRAVENOUS
  Filled 2017-02-06: qty 2

## 2017-02-06 MED ORDER — SODIUM CHLORIDE 0.9 % IV BOLUS (SEPSIS)
500.0000 mL | Freq: Once | INTRAVENOUS | Status: AC
  Administered 2017-02-06: 500 mL via INTRAVENOUS

## 2017-02-06 MED ORDER — CEPHALEXIN 250 MG PO CAPS
500.0000 mg | ORAL_CAPSULE | Freq: Once | ORAL | Status: AC
  Administered 2017-02-06: 500 mg via ORAL
  Filled 2017-02-06: qty 2

## 2017-02-06 MED ORDER — CEPHALEXIN 500 MG PO CAPS: 500.0000 mg | ORAL_CAPSULE | Freq: Three times a day (TID) | ORAL | 0 refills | Status: AC

## 2017-02-06 NOTE — Discharge Instructions (Signed)
You were seen in the ED today with a tracheostomy problem. We did not see any sign of infection in the neck but you do have some skin infection near the tracheostomy. We are starting antibiotics and you should follow up with Dr. Jenne PaneBates in the office this week for a tracheostomy change.   Return to the ED with any worsening pain, fever, difficulty breathing, or bleeding from the tracheostomy site.

## 2017-02-06 NOTE — ED Provider Notes (Signed)
Emergency Department Provider Note   I have reviewed the triage vital signs and the nursing notes.   HISTORY  Chief Complaint Tracheostomy Tube Change   HPI Yesenia [name] is a 56 y.o. female with PMH of CKD, DM, HLD, HTN, and OSA requiring trach placement for nighttime support presents to the ED for evaluation of 1 week of pain near the tracheostomy site with foul-smelling discharge. She reports changing the surrounding gauze 1 week ago and feels that the tube may have been dislodged slightly. She continues to use the trach at night but has increased pain with suctioning. No fever or chills. No difficulty breathing. No chest pain. She is followed by Dr. Jenne [name] and has an appointment in trach clinic this Thursday. No radiation of symptoms. No other modifying factors.    Past Medical History:  Diagnosis Date  . Anemia   . Anemia   . Chronic pain   . CKD (chronic kidney disease)   . Diabetes mellitus without complication (HCC)    diet controlled. does not check CBG's  . Fibromyalgia    hospitilized 12/16 due to inability to walk  . Hyperlipemia   . Hypertension   . Insomnia   . Neuropathy   . Right sided weakness   . Stroke [facility]) 2014?   no residual weakness   . Vitamin deficiency    Vit D    Patient Active Problem List   Diagnosis Date Noted  . Acute on chronic respiratory failure with hypoxemia (HCC) 09/30/2016  . Acute tracheitis 09/30/2016  . Abscess   . Complication of tracheostomy (HCC) 06/23/2016  . Bleeding 06/23/2016  . Iron deficiency anemia 05/26/2016  . 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 Irritable bowel syndrome 05/21/2016  . Anxiety state 05/21/2016  . Tracheostomy dependent (HCC)   . Generalized weakness 04/13/2016  . Weakness generalized 04/13/2016  . HCAP (healthcare-associated pneumonia) 03/06/2016  . Fibromyalgia 02/22/2016  . Cellulitis 02/21/2016  . Abdominal pain 02/16/2016  . Constipation 02/16/2016  . CKD (chronic kidney disease)  stage 3, GFR 30-59 ml/min 02/16/2016  . Fever   . Cough   . OSA (obstructive sleep apnea) 02/13/2016  . Insomnia 08/10/2015  . Polyneuropathy 07/10/2015  . GERD (gastroesophageal reflux disease) 07/10/2015  . Acute kidney injury superimposed on chronic kidney disease (HCC) 07/07/2015  . Gait disturbance 07/07/2015  . Morbid (severe) obesity due to excess calories (HCC) complicated by OSA 07/07/2015  . Left hip pain 07/06/2015  . Chronic pain   . Right sided weakness   . Headache(784.0) 04/14/2013  . Encephalopathy acute 04/11/2013  . Type 2 diabetes mellitus with stage 3 chronic kidney disease, without long-term current use of insulin (HCC) 04/11/2013  . Dyslipidemia 04/11/2013  . Chest pain 02/15/2013  . Cerebral infarction (HCC) 01/14/2013  . Essential hypertension 01/14/2013  . Anemia 01/14/2013    Past Surgical History:  Procedure Laterality Date  . KNEE SURGERY  1992, 1995, 1997  . LEG SURGERY Right 2007   Surgery x3 2 arthroscopies and one rod  . OOPHORECTOMY Right 1993?  . TRACHEOSTOMY TUBE PLACEMENT N/A 02/13/2016   Procedure: TRACHEOSTOMY;  Surgeon: Christia Reading, MD;  Location: [facility] OR;  Service: ENT;  Laterality: N/A;    Current Outpatient Rx  . Order #: [id number] Class: Historical Med  . Order #: [id number] Class: Historical Med  . Order #: [id number] Class: Historical Med  . Order #: [id number] Class: Historical Med  . Order #: [id number] Class: Print  . Order #: [id number] Class: Historical Med  . Order #:  [id number] Class: Print  . Order #: [id number] Class: Historical Med  . Order #: [id number] Class: Print  . Order #: [id number] Class: Print  . Order #: [id number] Class: Normal  . Order #: [id number] Class: Historical Med  . Order #: [id number] Class: Historical Med  . Order #: [id number] Class: Historical Med  . Order #: [id number] Class: Print  . Order #: [id number] Class: Print  . Order #: [id number] Class: Historical Med    Allergies Buprenorphine hcl and Morphine and  related  Family History  Problem Relation Age of Onset  . Breast cancer Mother   . Leukemia Brother   . Breast cancer Maternal Grandmother     Social History Social History  Substance Use Topics  . Smoking status: Never Smoker  . Smokeless tobacco: Never Used  . Alcohol use No    Review of Systems  Constitutional: No fever/chills Eyes: No visual changes. ENT: No sore throat. Positive pain near trach site.  Cardiovascular: Denies chest pain. Respiratory: Denies shortness of breath. Gastrointestinal: No abdominal pain.  No nausea, no vomiting.  No diarrhea.  No constipation. Genitourinary: Negative for dysuria. Musculoskeletal: Negative for back pain. Skin: Negative for rash. Neurological: Negative for headaches, focal weakness or numbness.  10-point ROS otherwise negative.  ____________________________________________   PHYSICAL EXAM:  VITAL SIGNS: ED Triage Vitals [02/06/17 1545]  Enc Vitals Group     BP (!) 142/87     Pulse Rate 100     Resp 20     Temp 98.7 F (37.1 C)     Temp Source Oral     SpO2 98 %   Constitutional: Alert and oriented. Well appearing and in no acute distress. Eyes: Conjunctivae are normal. Head: Atraumatic. Nose: No congestion/rhinnorhea. Mouth/Throat: Mucous membranes are moist.  Oropharynx non-erythematous. Neck: No stridor. Yesenia [name] is slightly red at the 3-o-clock position. No purulent drainage.  Cardiovascular: Normal rate, regular rhythm. Good peripheral circulation. Grossly normal heart sounds.   Respiratory: Normal respiratory effort.  No retractions. Lungs CTAB. Gastrointestinal: Soft and nontender. No distention.  Musculoskeletal: No lower extremity tenderness nor edema. No gross deformities of extremities. Neurologic:  Normal speech and language. No gross focal neurologic deficits are appreciated.  Skin:  Skin is warm, dry and intact. No rash noted.  ____________________________________________   LABS (all labs ordered  are listed, but only abnormal results are displayed)  Labs Reviewed  BASIC METABOLIC PANEL - Abnormal; Notable for the following:       Result Value   Potassium 3.3 (*)    Glucose, Bld 132 (*)    Creatinine, Ser 1.03 (*)    Calcium 8.8 (*)    All other components within normal limits  CBC WITH DIFFERENTIAL/PLATELET   ____________________________________________  RADIOLOGY  Ct Soft Tissue Neck W Contrast  Result Date: 02/06/2017 CLINICAL DATA:  Tracheal swelling and redness for 1 week. This area is painful. EXAM: CT NECK WITH CONTRAST TECHNIQUE: Multidetector CT imaging of the neck was performed using the standard protocol following the bolus administration of intravenous contrast. CONTRAST:  75mL ISOVUE-300 IOPAMIDOL (ISOVUE-300) INJECTION 61% COMPARISON:  02/22/2016 CT neck.  11/26/2015 CT neck. FINDINGS: Pharynx and larynx: No mucosal or submucosal lesion is visible. There is medialization of the RIGHT vocal cord suggesting paresis seen in 2017. Tracheostomy in good position, well-defined tract, present since 2017. There does appear to be some superficial inflammation around the tracheostomy, most notable inferiorly. See sagittal image 51 series 5, coronal image 36 series 6, axial image 67 series 3. No superficial or deep abscess. Salivary glands: Parotid glands  are normal. Submandibular glands are normal. Thyroid: Unremarkable. Lymph nodes: No pathologic adenopathy. Vascular: Negative. Limited intracranial: Negative. Visualized orbits: Not visualized. Mastoids and visualized paranasal sinuses: No layering fluid. Skeleton: Spondylosis. Upper chest: No pneumothorax or nodule. Mild stranding of the prevascular fat anterior to the arch, unchanged from priors. Other: None. IMPRESSION: Findings consistent with superficial cellulitis surrounding the well established tracheostomy tract. There is no superficial or deep abscess. No significant mediastinal inflammation is observed. No concerning features  of the airway. Electronically Signed   By: Elsie StainJohn T Curnes M.D.   On: 02/06/2017 18:54    ____________________________________________   PROCEDURES  Procedure(s) performed:   Procedures  None ____________________________________________   INITIAL IMPRESSION / ASSESSMENT AND PLAN / ED COURSE  Pertinent labs & imaging results that were available during my care of the patient were reviewed by me and considered in my medical decision making (see chart for details).  Patient presents to the emergency department for evaluation of pain at her tracheostomy site. She has had chronic pain at the site in the past. Symptoms have been ongoing for a week. There is some redness at the 3:00 position but no obvious abscess. Followed by Dr. Jenne PaneBates. Has a history of scar tissue in the area. Plan for CT of the neck to evaluate for deep space abscess or other anatomical change.   07:15 PM Spoke with Dr. Jenne PaneBates regarding the patient. Recommends Keflex for cellulitis. Patient is due for trach change. She has a appointment scheduled this Thursday. Plan for discharge at this time.   At this time, I do not feel there is any life-threatening condition present. I have reviewed and discussed all results (EKG, imaging, lab, urine as appropriate), exam findings with patient. I have reviewed nursing notes and appropriate previous records.  I feel the patient is safe to be discharged home without further emergent workup. Discussed usual and customary return precautions. Patient and family (if present) verbalize understanding and are comfortable with this plan.  Patient will follow-up with their primary care provider. If they do not have a primary care provider, information for follow-up has been provided to them. All questions have been answered.  ____________________________________________  FINAL CLINICAL IMPRESSION(S) / ED DIAGNOSES  Final diagnoses:  Acute tracheostomy management (HCC)  Cellulitis of neck      MEDICATIONS GIVEN DURING THIS VISIT:  Medications  sodium chloride 0.9 % bolus 500 mL (0 mLs Intravenous Stopped 02/06/17 1931)  fentaNYL (SUBLIMAZE) injection 50 mcg (50 mcg Intravenous Given 02/06/17 1656)  iopamidol (ISOVUE-300) 61 % injection (75 mLs  Contrast Given 02/06/17 1815)  cephALEXin (KEFLEX) capsule 500 mg (500 mg Oral Given 02/06/17 1947)     NEW OUTPATIENT MEDICATIONS STARTED DURING THIS VISIT:  Discharge Medication List as of 02/06/2017  7:33 PM    START taking these medications   Details  cephALEXin (KEFLEX) 500 MG capsule Take 1 capsule (500 mg total) by mouth 3 (three) times daily., Starting Sun 02/06/2017, Until Sun 02/13/2017, Print        Note:  This document was prepared using Dragon voice recognition software and may include unintentional dictation errors.  Alona BeneJoshua Long, MD Emergency Medicine    Long, Arlyss RepressJoshua G, MD 02/06/17 2329

## 2017-02-06 NOTE — ED Notes (Signed)
CALLED [facility] FOR TRANSPORT HOME--Yesenia [name]  

## 2017-02-06 NOTE — ED Triage Notes (Signed)
Per EMS, pt has had increased soreness and redness to her trach site x 1 week. Pt states it feels like her trach is displaced. EMS vitals: BP-130/90, P-100, SpO2 94-95% room air.

## 2017-02-17 ENCOUNTER — Other Ambulatory Visit: Payer: Self-pay | Admitting: *Deleted

## 2017-02-17 DIAGNOSIS — Z1231 Encounter for screening mammogram for malignant neoplasm of breast: Secondary | ICD-10-CM

## 2017-02-28 ENCOUNTER — Ambulatory Visit
Admission: RE | Admit: 2017-02-28 | Discharge: 2017-02-28 | Disposition: A | Discharge status: Home / Self Care | Payer: Medicaid Other | Source: Ambulatory Visit | Attending: *Deleted | Admitting: *Deleted

## 2017-02-28 DIAGNOSIS — Z1231 Encounter for screening mammogram for malignant neoplasm of breast: Secondary | ICD-10-CM

## 2017-03-18 ENCOUNTER — Other Ambulatory Visit: Payer: Self-pay | Admitting: Adult Health

## 2017-03-19 ENCOUNTER — Observation Stay (HOSPITAL_COMMUNITY)
Admission: EM | Admit: 2017-03-19 | Discharge: 2017-03-20 | Disposition: A | Discharge status: Home / Self Care | Payer: Medicaid Other | Attending: Otolaryngology | Admitting: Otolaryngology

## 2017-03-19 ENCOUNTER — Emergency Department (HOSPITAL_COMMUNITY): Payer: Medicaid Other

## 2017-03-19 ENCOUNTER — Encounter (HOSPITAL_COMMUNITY): Payer: Self-pay | Admitting: *Deleted

## 2017-03-19 DIAGNOSIS — N183 Chronic kidney disease, stage 3 (moderate): Secondary | ICD-10-CM | POA: Diagnosis not present

## 2017-03-19 DIAGNOSIS — Z888 Allergy status to other drugs, medicaments and biological substances status: Secondary | ICD-10-CM | POA: Insufficient documentation

## 2017-03-19 DIAGNOSIS — E785 Hyperlipidemia, unspecified: Secondary | ICD-10-CM | POA: Insufficient documentation

## 2017-03-19 DIAGNOSIS — E1122 Type 2 diabetes mellitus with diabetic chronic kidney disease: Secondary | ICD-10-CM | POA: Insufficient documentation

## 2017-03-19 DIAGNOSIS — Z430 Encounter for attention to tracheostomy: Principal | ICD-10-CM | POA: Insufficient documentation

## 2017-03-19 DIAGNOSIS — Z8673 Personal history of transient ischemic attack (TIA), and cerebral infarction without residual deficits: Secondary | ICD-10-CM | POA: Diagnosis not present

## 2017-03-19 DIAGNOSIS — N179 Acute kidney failure, unspecified: Secondary | ICD-10-CM | POA: Diagnosis not present

## 2017-03-19 DIAGNOSIS — I130 Hypertensive heart and chronic kidney disease with heart failure and stage 1 through stage 4 chronic kidney disease, or unspecified chronic kidney disease: Secondary | ICD-10-CM | POA: Diagnosis not present

## 2017-03-19 DIAGNOSIS — G4733 Obstructive sleep apnea (adult) (pediatric): Secondary | ICD-10-CM | POA: Diagnosis present

## 2017-03-19 DIAGNOSIS — Z6841 Body Mass Index (BMI) 40.0 and over, adult: Secondary | ICD-10-CM | POA: Diagnosis not present

## 2017-03-19 DIAGNOSIS — G4700 Insomnia, unspecified: Secondary | ICD-10-CM | POA: Insufficient documentation

## 2017-03-19 DIAGNOSIS — K219 Gastro-esophageal reflux disease without esophagitis: Secondary | ICD-10-CM | POA: Insufficient documentation

## 2017-03-19 DIAGNOSIS — J9621 Acute and chronic respiratory failure with hypoxia: Secondary | ICD-10-CM | POA: Diagnosis not present

## 2017-03-19 DIAGNOSIS — D509 Iron deficiency anemia, unspecified: Secondary | ICD-10-CM | POA: Diagnosis not present

## 2017-03-19 DIAGNOSIS — J0410 Acute tracheitis without obstruction: Secondary | ICD-10-CM | POA: Diagnosis not present

## 2017-03-19 DIAGNOSIS — I509 Heart failure, unspecified: Secondary | ICD-10-CM | POA: Diagnosis not present

## 2017-03-19 DIAGNOSIS — E1140 Type 2 diabetes mellitus with diabetic neuropathy, unspecified: Secondary | ICD-10-CM | POA: Insufficient documentation

## 2017-03-19 DIAGNOSIS — F411 Generalized anxiety disorder: Secondary | ICD-10-CM | POA: Diagnosis not present

## 2017-03-19 DIAGNOSIS — J9500 Unspecified tracheostomy complication: Secondary | ICD-10-CM

## 2017-03-19 DIAGNOSIS — K589 Irritable bowel syndrome without diarrhea: Secondary | ICD-10-CM | POA: Diagnosis not present

## 2017-03-19 DIAGNOSIS — M797 Fibromyalgia: Secondary | ICD-10-CM | POA: Insufficient documentation

## 2017-03-19 DIAGNOSIS — G8929 Other chronic pain: Secondary | ICD-10-CM | POA: Diagnosis not present

## 2017-03-19 DIAGNOSIS — G4730 Sleep apnea, unspecified: Secondary | ICD-10-CM | POA: Diagnosis present

## 2017-03-19 MED ORDER — ALBUTEROL SULFATE (2.5 MG/3ML) 0.083% IN NEBU
5.0000 mg | INHALATION_SOLUTION | Freq: Once | RESPIRATORY_TRACT | Status: AC
  Administered 2017-03-19: 5 mg via RESPIRATORY_TRACT
  Filled 2017-03-19: qty 6

## 2017-03-19 NOTE — ED Triage Notes (Signed)
Pt to ED from home after her trach was dislodged when coughing tonight. Pt has noticed the trach to be loose for the past 2 days. C/o pain at ostomy. EMS reported sats at 96% on room air that increased with simple mask.

## 2017-03-20 ENCOUNTER — Encounter (HOSPITAL_COMMUNITY): Payer: Self-pay | Admitting: *Deleted

## 2017-03-20 DIAGNOSIS — G4730 Sleep apnea, unspecified: Secondary | ICD-10-CM | POA: Diagnosis present

## 2017-03-20 LAB — GLUCOSE, CAPILLARY
GLUCOSE-CAPILLARY: 116 mg/dL — AB (ref 65–99)
Glucose-Capillary: 115 mg/dL — ABNORMAL HIGH (ref 65–99)
Glucose-Capillary: 119 mg/dL — ABNORMAL HIGH (ref 65–99)
Glucose-Capillary: 142 mg/dL — ABNORMAL HIGH (ref 65–99)

## 2017-03-20 LAB — MRSA PCR SCREENING: MRSA by PCR: NEGATIVE

## 2017-03-20 MED ORDER — CHOLESTYRAMINE 4 G PO PACK
4.0000 g | PACK | Freq: Every day | ORAL | Status: DC
  Administered 2017-03-20: 4 g via ORAL
  Filled 2017-03-20: qty 1

## 2017-03-20 MED ORDER — ATENOLOL 50 MG PO TABS
50.0000 mg | ORAL_TABLET | Freq: Every day | ORAL | Status: DC
  Administered 2017-03-20: 50 mg via ORAL
  Filled 2017-03-20: qty 1

## 2017-03-20 MED ORDER — GABAPENTIN 300 MG PO CAPS
300.0000 mg | ORAL_CAPSULE | Freq: Every day | ORAL | Status: DC
  Administered 2017-03-20: 300 mg via ORAL
  Filled 2017-03-20: qty 1

## 2017-03-20 MED ORDER — BUSPIRONE HCL 15 MG PO TABS: 15.0000 mg | ORAL_TABLET | Freq: Two times a day (BID) | ORAL | Status: DC

## 2017-03-20 MED ORDER — TRAZODONE HCL 50 MG PO TABS: 150.0000 mg | ORAL_TABLET | Freq: Every day | ORAL | Status: DC

## 2017-03-20 MED ORDER — FERROUS SULFATE 325 (65 FE) MG PO TABS
325.0000 mg | ORAL_TABLET | Freq: Every day | ORAL | Status: DC
  Administered 2017-03-20: 325 mg via ORAL
  Filled 2017-03-20: qty 1

## 2017-03-20 MED ORDER — ESCITALOPRAM OXALATE 10 MG PO TABS: 10.0000 mg | ORAL_TABLET | Freq: Every day | ORAL | Status: DC

## 2017-03-20 MED ORDER — DIPHENHYDRAMINE-ZINC ACETATE 2-0.1 % EX CREA: TOPICAL_CREAM | Freq: Two times a day (BID) | CUTANEOUS | Status: DC | PRN

## 2017-03-20 MED ORDER — ALBUTEROL SULFATE (2.5 MG/3ML) 0.083% IN NEBU: 3.0000 mL | INHALATION_SOLUTION | Freq: Four times a day (QID) | RESPIRATORY_TRACT | Status: DC | PRN

## 2017-03-20 MED ORDER — AMLODIPINE BESYLATE 10 MG PO TABS
10.0000 mg | ORAL_TABLET | Freq: Every day | ORAL | Status: DC
  Administered 2017-03-20: 10 mg via ORAL
  Filled 2017-03-20 (×3): qty 1

## 2017-03-20 MED ORDER — DIPHENHYDRAMINE-ZINC ACETATE 2-0.1 % EX CREA: TOPICAL_CREAM | Freq: Four times a day (QID) | CUTANEOUS | Status: DC | PRN

## 2017-03-20 MED ORDER — IBUPROFEN 400 MG PO TABS: 800.0000 mg | ORAL_TABLET | Freq: Three times a day (TID) | ORAL | Status: DC | PRN

## 2017-03-20 MED ORDER — TRAMADOL HCL 50 MG PO TABS
50.0000 mg | ORAL_TABLET | Freq: Four times a day (QID) | ORAL | Status: DC | PRN
  Administered 2017-03-20: 50 mg via ORAL
  Filled 2017-03-20: qty 1

## 2017-03-20 MED ORDER — CLONAZEPAM 1 MG PO TABS
1.0000 mg | ORAL_TABLET | Freq: Every day | ORAL | Status: DC
  Administered 2017-03-20: 1 mg via ORAL
  Filled 2017-03-20: qty 1

## 2017-03-20 MED ORDER — BENZONATATE 100 MG PO CAPS: 200.0000 mg | ORAL_CAPSULE | Freq: Three times a day (TID) | ORAL | Status: DC | PRN

## 2017-03-20 MED ORDER — ACETAMINOPHEN-CODEINE #3 300-30 MG PO TABS: 1.0000 | ORAL_TABLET | Freq: Four times a day (QID) | ORAL | Status: DC | PRN

## 2017-03-20 NOTE — H&P (Signed)
Yesenia [name] is an 56 y.o. female.   Chief Complaint: Sleep apnea, dislodged trach tube HPI: 56 year old female with severe obstructive sleep apnea for which she underwent tracheostomy placement about one year ago.  Fairly recently, she was changed to a Montgomery stoma stent.  However, she has had difficulty breathing with it the past couple of days and it became dislodged this evening.  She came to the ER.  She expresses that she wants the tube removed.  She is tired of the hassle and understands that she will have to try CPAP again.  Past Medical History:  Diagnosis Date  . Anemia   . Anemia   . Chronic pain   . CKD (chronic kidney disease)   . Diabetes mellitus without complication (HCC)    diet controlled. does not check CBG's  . Fibromyalgia    hospitilized 12/16 due to inability to walk  . Hyperlipemia   . Hypertension   . Insomnia   . Neuropathy   . Right sided weakness   . Stroke [facility](HCC) 2014?   no residual weakness   . Vitamin deficiency    Vit D    Past Surgical History:  Procedure Laterality Date  . KNEE SURGERY  1992, 1995, 1997  . LEG SURGERY Right 2007   Surgery x3 2 arthroscopies and one rod  . OOPHORECTOMY Right 1993?  . TRACHEOSTOMY TUBE PLACEMENT N/A 02/13/2016   Procedure: TRACHEOSTOMY;  Surgeon: Christia Readingwight Kathalene Sporer, MD;  [facility];  Service: ENT;  Laterality: N/A;    Family History  Problem Relation Age of Onset  . Breast cancer Mother   . Leukemia Brother   . Breast cancer Maternal Grandmother    Social History:  reports that she has never smoked. She has never used smokeless tobacco. She reports that she does not drink alcohol or use drugs.  Allergies:  Allergies  Allergen Reactions  . Buprenorphine Hcl Hives  . Morphine And Related Hives and Dermatitis     (Not in a hospital admission)  No results found for this or any previous visit (from the past 48 hour(s)). No results found.  Review of Systems  All other systems reviewed and are  negative.   Blood pressure (!) 136/98, pulse (!) 104, temperature 97.9 F (36.6 C), temperature source Oral, resp. rate (!) 28, SpO2 98 %. Physical Exam  Constitutional: She is oriented to person, place, and time. She appears well-developed and well-nourished. No distress.  HENT:  Head: Normocephalic and atraumatic.  Right Ear: External ear normal.  Left Ear: External ear normal.  Nose: Nose normal.  Mouth/Throat: Oropharynx is clear and moist.  Eyes: Pupils are equal, round, and reactive to light. Conjunctivae and EOM are normal.  Neck: Normal range of motion. Neck supple.  Trach site with #4 cuffless Shiley in place.  Cardiovascular: Normal rate.   Respiratory: Effort normal.  Musculoskeletal: Normal range of motion.  Neurological: She is alert and oriented to person, place, and time. No cranial nerve deficit.  Skin: Skin is warm and dry.  Psychiatric: She has a normal mood and affect. Her behavior is normal. Judgment and thought content normal.     Assessment/Plan Severe obstructive sleep apnea, trach status  We discussed options.  She emphatically wants the trach tube removed so the tube was removed and an occlusive dressing was applied.  She will be observed overnight.  Christia ReadingBATES, Edras Wilford, MD 03/20/2017, 1:14 AM

## 2017-03-20 NOTE — ED Provider Notes (Signed)
[facility]-EMERGENCY DEPT Provider Note   CSN: [id number] Arrival date & time: 03/19/17  2323     History   Chief Complaint Chief Complaint  Patient presents with  . Tracheostomy Tube Change    HPI Yesenia [name] is a 56 y.o. female.  HPI Patient is a 56 year old female who presents the emergency department after her tracheostomy which been present for approximate 6 weeks became dislodged from coughing.  She states this is been loose over the past 2 days.  She originally had this placed secondary to sleep apnea.  She is brought to the emergency department on trach collar.  No recent fevers or chills but does report some increasing secretions and bronchitis-like symptoms.  No chest pain.  The bleeding.  No other complaints.  Patient is a patient of Dr. Jenne Pane   Past Medical History:  Diagnosis Date  . Anemia   . Anemia   . Chronic pain   . CKD (chronic kidney disease)   . Diabetes mellitus without complication (HCC)    diet controlled. does not check CBG's  . Fibromyalgia    hospitilized 12/16 due to inability to walk  . Hyperlipemia   . Hypertension   . Insomnia   . Neuropathy   . Right sided weakness   . Stroke [facility]) 2014?   no residual weakness   . Vitamin deficiency    Vit D    Patient Active Problem List   Diagnosis Date Noted  . Sleep apnea 03/20/2017  . Acute on chronic respiratory failure with hypoxemia (HCC) 09/30/2016  . Acute tracheitis 09/30/2016  . Abscess   . Complication of tracheostomy (HCC) 06/23/2016  . Bleeding 06/23/2016  . Iron deficiency anemia 05/26/2016  . 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 Irritable bowel syndrome 05/21/2016  . Anxiety state 05/21/2016  . Tracheostomy dependent (HCC)   . Generalized weakness 04/13/2016  . Weakness generalized 04/13/2016  . HCAP (healthcare-associated pneumonia) 03/06/2016  . Fibromyalgia 02/22/2016  . Cellulitis 02/21/2016  . Abdominal pain 02/16/2016  . Constipation 02/16/2016  . CKD (chronic kidney  disease) stage 3, GFR 30-59 ml/min 02/16/2016  . Fever   . Cough   . OSA (obstructive sleep apnea) 02/13/2016  . Insomnia 08/10/2015  . Polyneuropathy 07/10/2015  . GERD (gastroesophageal reflux disease) 07/10/2015  . Acute kidney injury superimposed on chronic kidney disease (HCC) 07/07/2015  . Gait disturbance 07/07/2015  . Morbid (severe) obesity due to excess calories (HCC) complicated by OSA 07/07/2015  . Left hip pain 07/06/2015  . Chronic pain   . Right sided weakness   . Headache(784.0) 04/14/2013  . Encephalopathy acute 04/11/2013  . Type 2 diabetes mellitus with stage 3 chronic kidney disease, without long-term current use of insulin (HCC) 04/11/2013  . Dyslipidemia 04/11/2013  . Chest pain 02/15/2013  . Cerebral infarction (HCC) 01/14/2013  . Essential hypertension 01/14/2013  . Anemia 01/14/2013    Past Surgical History:  Procedure Laterality Date  . KNEE SURGERY  1992, 1995, 1997  . LEG SURGERY Right 2007   Surgery x3 2 arthroscopies and one rod  . OOPHORECTOMY Right 1993?  . TRACHEOSTOMY TUBE PLACEMENT N/A 02/13/2016   Procedure: TRACHEOSTOMY;  Surgeon: Christia Reading, MD;  Location: [facility] OR;  Service: ENT;  Laterality: N/A;    OB History    No data available       Home Medications    Prior to Admission medications   Medication Sig Start Date End Date Taking? Authorizing Provider  acetaminophen-codeine (TYLENOL #3) 300-30 MG tablet  Take 1 tablet by mouth every 6 (six) hours as needed for pain. for pain 08/16/16  Yes [provider]  albuterol (PROVENTIL HFA;VENTOLIN HFA) 108 (90 BASE) MCG/ACT inhaler Inhale 2 puffs into the lungs every 6 (six) hours as needed for wheezing.   Yes [provider]  amLODipine (NORVASC) 10 MG tablet Take 10 mg by mouth daily. 09/07/16  Yes [provider]  atenolol (TENORMIN) 50 MG tablet Take 50 mg by mouth daily. 02/16/16  Yes [provider]  cholestyramine (QUESTRAN) 4 g packet Take 4 g by mouth  daily.   Yes [provider]  clonazePAM (KLONOPIN) 1 MG tablet Take 1 tablet (1 mg total) by mouth at bedtime. 06/28/16  Yes Sharon SellerEubanks, Jessica K, NP  ferrous sulfate 325 (65 FE) MG tablet Take 325 mg by mouth daily. 09/13/14  Yes [provider]  gabapentin (NEURONTIN) 300 MG capsule Take 300 mg by mouth at bedtime.   Yes [provider]  hydrOXYzine (ATARAX/VISTARIL) 50 MG tablet Take 50 mg by mouth at bedtime. 02/09/17  Yes [provider]  ibuprofen (ADVIL,MOTRIN) 800 MG tablet Take 1 tablet (800 mg total) by mouth every 8 (eight) hours as needed for mild pain. 10/01/16  Yes Simonne MartinetBabcock, Peter E, NP  ondansetron (ZOFRAN-ODT) 8 MG disintegrating tablet Take 8 mg by mouth every 8 (eight) hours as needed for nausea/vomiting. 02/21/17  Yes [provider]  traMADol (ULTRAM) 50 MG tablet Take 1 tablet (50 mg total) by mouth every 6 (six) hours as needed for moderate pain or severe pain. 06/28/16  Yes Sharon SellerEubanks, Jessica K, NP  traZODone (DESYREL) 150 MG tablet Take 150 mg by mouth at bedtime.   Yes [provider]  Vitamin D, Ergocalciferol, (DRISDOL) 50000 units CAPS capsule Take 50,000 Units by mouth every Monday. 02/18/17  Yes [provider]    Family History Family History  Problem Relation Age of Onset  . Breast cancer Mother   . Leukemia Brother   . Breast cancer Maternal Grandmother     Social History Social History  Substance Use Topics  . Smoking status: Never Smoker  . Smokeless tobacco: Never Used  . Alcohol use No     Allergies   Buprenorphine hcl and Morphine and related   Review of Systems Review of Systems  All other systems reviewed and are negative.    Physical Exam Updated Vital Signs BP 119/63 (BP Location: Right Arm)   Pulse 91   Temp 98.6 F (37 C) (Oral)   Resp 18   Ht 5\' 6"  (1.676 m)   Wt (!) 143.3 kg (315 lb 14.7 oz)   SpO2 98%   BMI 50.99 kg/m   Physical Exam  Constitutional: She is oriented  to person, place, and time. She appears well-developed and well-nourished.  HENT:  Head: Normocephalic.  Eyes: EOM are normal.  Neck: Normal range of motion.  Patent trach stoma.  No surrounding erythema.  No bleeding.  Cardiovascular: Normal rate.   Pulmonary/Chest: Effort normal and breath sounds normal. No respiratory distress.  Abdominal: She exhibits no distension.  Musculoskeletal: Normal range of motion.  Neurological: She is alert and oriented to person, place, and time.  Psychiatric: She has a normal mood and affect.  Nursing note and vitals reviewed.    ED Treatments / Results  Labs (all labs ordered are listed, but only abnormal results are displayed) Labs Reviewed  GLUCOSE, CAPILLARY - Abnormal; Notable for the following:  Result Value   Glucose-Capillary 115 (*)    All other components within normal limits  GLUCOSE, CAPILLARY - Abnormal; Notable for the following:    Glucose-Capillary 119 (*)    All other components within normal limits  MRSA PCR SCREENING    EKG  EKG Interpretation None       Radiology Dg Chest Portable 1 View  Result Date: 03/20/2017 CLINICAL DATA:  Tracheostomy change. Cough. History of CHF, diabetes, stroke, hypertension. EXAM: PORTABLE CHEST 1 VIEW COMPARISON:  06/22/2016 FINDINGS: Tracheostomy. Shallow inspiration. Mild cardiac enlargement. No vascular congestion or edema. No airspace disease or consolidation in the lungs. No blunting of costophrenic angles. No pneumothorax. IMPRESSION: Tracheostomy tube present. Shallow inspiration. Mild cardiac enlargement. No evidence of active pulmonary disease. Electronically Signed   By: Burman Nieves M.D.   On: 03/20/2017 03:14    Procedures TRACHEOSTOMY REPLACEMENT Performed by: Azalia Bilis Authorized by: Azalia Bilis  Consent: Verbal consent obtained. Risks and benefits: risks, benefits and alternatives were discussed Consent given by: patient Patient understanding: patient  states understanding of the procedure being performed Required items: required blood products, implants, devices, and special equipment available Patient identity confirmed: verbally with patient Time out: Immediately prior to procedure a "time out" was called to verify the correct patient, procedure, equipment, support staff and site/side marked as required. Indications: became dislodged Local anesthesia used: no  Anesthesia: Local anesthesia used: no  Sedation: Patient sedated: no Tube type: single cannula Tube cuff: cuffless Tube size: 6.0 mm Nasopharyngoscope used: ETCO2. Complications comment: none Patient tolerance: Patient tolerated the procedure well with no immediate complications    (including critical care time)  Medications Ordered in ED Medications  albuterol (PROVENTIL) (2.5 MG/3ML) 0.083% nebulizer solution 3 mL (not administered)  ferrous sulfate tablet 325 mg (not administered)  traZODone (DESYREL) tablet 150 mg (not administered)  atenolol (TENORMIN) tablet 50 mg (not administered)  cholestyramine (QUESTRAN) packet 4 g (not administered)  gabapentin (NEURONTIN) capsule 300 mg (300 mg Oral Given 03/20/17 0505)  traMADol (ULTRAM) tablet 50 mg (not administered)  clonazePAM (KLONOPIN) tablet 1 mg (1 mg Oral Given 03/20/17 0504)  acetaminophen-codeine (TYLENOL #3) 300-30 MG per tablet 1 tablet (not administered)  amLODipine (NORVASC) tablet 10 mg (0 mg Oral Duplicate 03/20/17 1000)  ibuprofen (ADVIL,MOTRIN) tablet 800 mg (not administered)  albuterol (PROVENTIL) (2.5 MG/3ML) 0.083% nebulizer solution 5 mg (5 mg Nebulization Given 03/19/17 2350)     Initial Impression / Assessment and Plan / ED Course  I have reviewed the triage vital signs and the nursing notes.  Pertinent labs & imaging results that were available during my care of the patient were reviewed by me and considered in my medical decision making (see chart for details).     Trach placed and exchanged  without difficulty.  Patient did have some mild coughing afterwards which seemed to respond to bronchodilators.  I discussed the case with her ear nose and throat surgeon Dr. Jenne Pane who will evaluate the patient the bedside  Final Clinical Impressions(s) / ED Diagnoses   Final diagnoses:  None    New Prescriptions Current Discharge Medication List       Azalia Bilis, MD 03/20/17 0930

## 2017-03-20 NOTE — Discharge Summary (Signed)
Physician Discharge Summary  Patient ID: Yesenia [name] MRN: [id number] DOB/AGE: 04-02-1961 56 y.o.  Admit date: 03/19/2017 Discharge date: 03/20/2017  Admission Diagnoses: Sleep apnea, tracheostomy status  Discharge Diagnoses:  Active Problems:   Sleep apnea   Discharged Condition: good  Hospital Course: 56 year old female underwent tracheostomy placement in 8/17 for severe sleep apnea.  Recently, tube was changed to a [place] stoma stent.  She has had trouble breathing with it and it came out last night.  She was adamant about removing the trach tube in totality so this was performed in the ER.  She was observed overnight and did well.  She was stable for discharge the next day.  Consults: None  Significant Diagnostic Studies: None  Treatments: Decanulation  Discharge Exam: Blood pressure 119/63, pulse 91, temperature 98.6 F (37 C), temperature source Oral, resp. rate 18, height 5\' 6"  (1.676 m), weight (!) 315 lb 14.7 oz (143.3 kg), SpO2 98 %. General appearance: alert, cooperative and no distress Neck: Trach dressing in place, reinforced with tape.  Disposition: 01-Home or Self Care  Discharge Instructions    Diet - low sodium heart healthy    Complete by:  As directed    Discharge instructions    Complete by:  As directed    Keep dressing in place and dry.  Apply pressure to center of dressing when speaking or coughing.  Tylenol and/or Motrin for pain.   Increase activity slowly    Complete by:  As directed      Allergies as of 03/20/2017      Reactions   Buprenorphine Hcl Hives   Morphine And Related Hives, Dermatitis      Medication List    TAKE these medications   acetaminophen-codeine 300-30 MG tablet Commonly known as:  TYLENOL #3 Take 1 tablet by mouth every 6 (six) hours as needed for pain. for pain   albuterol 108 (90 Base) MCG/ACT inhaler Commonly known as:  PROVENTIL HFA;VENTOLIN HFA Inhale 2 puffs into the lungs every 6 (six) hours as needed for  wheezing.   amLODipine 10 MG tablet Commonly known as:  NORVASC Take 10 mg by mouth daily.   atenolol 50 MG tablet Commonly known as:  TENORMIN Take 50 mg by mouth daily.   cholestyramine 4 g packet Commonly known as:  QUESTRAN Take 4 g by mouth daily.   clonazePAM 1 MG tablet Commonly known as:  KLONOPIN Take 1 tablet (1 mg total) by mouth at bedtime.   ferrous sulfate 325 (65 FE) MG tablet Take 325 mg by mouth daily.   gabapentin 300 MG capsule Commonly known as:  NEURONTIN Take 300 mg by mouth at bedtime.   hydrOXYzine 50 MG tablet Commonly known as:  ATARAX/VISTARIL Take 50 mg by mouth at bedtime.   ibuprofen 800 MG tablet Commonly known as:  ADVIL,MOTRIN Take 1 tablet (800 mg total) by mouth every 8 (eight) hours as needed for mild pain.   ondansetron 8 MG disintegrating tablet Commonly known as:  ZOFRAN-ODT Take 8 mg by mouth every 8 (eight) hours as needed for nausea/vomiting.   traMADol 50 MG tablet Commonly known as:  ULTRAM Take 1 tablet (50 mg total) by mouth every 6 (six) hours as needed for moderate pain or severe pain.   traZODone 150 MG tablet Commonly known as:  DESYREL Take 150 mg by mouth at bedtime.   Vitamin D (Ergocalciferol) 50000 units Caps capsule Commonly known as:  DRISDOL Take 50,000 Units by mouth every Monday.  Discharge Care Instructions        Start     Ordered   03/20/17 0000  Increase activity slowly     03/20/17 1547   03/20/17 0000  Diet - low sodium heart healthy     03/20/17 1547   03/20/17 0000  Discharge instructions    Comments:  Keep dressing in place and dry.  Apply pressure to center of dressing when speaking or coughing.  Tylenol and/or Motrin for pain.   03/20/17 1547     Follow-up Information    Christia Reading, MD. Schedule an appointment as soon as possible for a visit on 03/25/2017.   Specialty:  Otolaryngology Contact information: [address] [place]  [id number] [phone]           Signed: Christia Reading 03/20/2017, 3:47 PM

## 2017-03-20 NOTE — Progress Notes (Signed)
Pt being discharged from hospital per orders from MD. Pt educated on discharge instructions. All questions and concerns were addressed. Pt's IV was removed prior to discharge. Pt exited hospital via wheelchair.

## 2017-04-15 ENCOUNTER — Encounter: Payer: Self-pay | Admitting: Nurse Practitioner

## 2017-04-27 ENCOUNTER — Encounter: Payer: Self-pay | Admitting: Nurse Practitioner

## 2017-04-27 ENCOUNTER — Ambulatory Visit (INDEPENDENT_AMBULATORY_CARE_PROVIDER_SITE_OTHER): Payer: Medicaid Other | Admitting: Nurse Practitioner

## 2017-04-27 ENCOUNTER — Encounter (INDEPENDENT_AMBULATORY_CARE_PROVIDER_SITE_OTHER): Payer: Self-pay

## 2017-04-27 VITALS — BP 142/74 | HR 74 | Ht 67.0 in | Wt 323.0 lb

## 2017-04-27 DIAGNOSIS — R1084 Generalized abdominal pain: Secondary | ICD-10-CM | POA: Diagnosis not present

## 2017-04-27 DIAGNOSIS — R197 Diarrhea, unspecified: Secondary | ICD-10-CM

## 2017-04-27 DIAGNOSIS — K589 Irritable bowel syndrome without diarrhea: Secondary | ICD-10-CM

## 2017-04-27 MED ORDER — DIPHENOXYLATE-ATROPINE 2.5-0.025 MG PO TABS: 1.0000 | ORAL_TABLET | Freq: Two times a day (BID) | ORAL | 0 refills | Status: DC

## 2017-04-27 NOTE — Progress Notes (Signed)
HPI: 56 yo female with multiple medical problems, multiple hospital admissions. She has HTN, CKD3, TIA, fibromyalgia, morbid obesity. She has severe OSA, unable to tolerate CPAP. She is s/p elective tracheostomy.  Transferred to outpatient rehab following a late March admission. Patient is referred by Lindaann Pascal, P.A. For abdominal / bowel changes. In rehab patient developed problems with crampy diarrhea. Rehab started daily Questran. No improvement. Tried imodium, didn't help. Loose stools mainly postprandial , no significant nocturnal stooling. Typically has 4-5 BMs after after eating despite what she eats.  Not all the BMs are liquid, usually has some solid BMs in between the diarrhea. No blood in stool. She does have pre-defecatory cramping. No recent weight loss. Weight is up from 315 to 323 pounds since 03/20/17.  She has had frequent nausea for a couple of months. She has had a tubal ligation. Zofran helps the nausea.   Patient had a screening colonoscopy with Dr. Elnoria Howard but cannot remember exactly when, thinks maybe 3 possibly 5 years ago. Sounds like she had an upper endoscopy at the time but does not know why. Patient says her memory is not good and she is unable to give many details.  Past Medical History:  Diagnosis Date  . Anemia   . Anemia   . Chronic pain   . CKD (chronic kidney disease)   . Diabetes mellitus without complication (HCC)    diet controlled. does not check CBG's  . Fibromyalgia    hospitilized 12/16 due to inability to walk  . Hyperlipemia   . Hypertension   . Insomnia   . Neuropathy   . Right sided weakness   . Stroke [facility]) 2014?   no residual weakness   . Vitamin deficiency    Vit D     Past Surgical History:  Procedure Laterality Date  . KNEE SURGERY  1992, 1995, 1997  . LEG SURGERY Right 2007   Surgery x3 2 arthroscopies and one rod  . OOPHORECTOMY Right 1993?  . TRACHEOSTOMY TUBE PLACEMENT N/A 02/13/2016   Procedure: TRACHEOSTOMY;  Surgeon:  Christia Reading, MD;  Location: [facility] OR;  Service: ENT;  Laterality: N/A;   Family History  Problem Relation Age of Onset  . Breast cancer Mother   . Leukemia Brother   . Breast cancer Maternal Grandmother    Social History  Substance Use Topics  . Smoking status: Never Smoker  . Smokeless tobacco: Never Used  . Alcohol use No   Current Outpatient Prescriptions  Medication Sig Dispense Refill  . acetaminophen-codeine (TYLENOL #3) 300-30 MG tablet Take 1 tablet by mouth every 6 (six) hours as needed for pain. for pain  0  . albuterol (PROVENTIL HFA;VENTOLIN HFA) 108 (90 BASE) MCG/ACT inhaler Inhale 2 puffs into the lungs every 6 (six) hours as needed for wheezing.    Marland Kitchen amLODipine (NORVASC) 10 MG tablet Take 10 mg by mouth daily.  3  . atenolol (TENORMIN) 50 MG tablet Take 50 mg by mouth daily.  3  . cholestyramine (QUESTRAN) 4 g packet Take 4 g by mouth daily.    . clonazePAM (KLONOPIN) 1 MG tablet Take 1 tablet (1 mg total) by mouth at bedtime. 30 tablet 0  . ferrous sulfate 325 (65 FE) MG tablet Take 325 mg by mouth daily.  6  . gabapentin (NEURONTIN) 300 MG capsule Take 300 mg by mouth at bedtime.    . hydrOXYzine (ATARAX/VISTARIL) 50 MG tablet Take 50 mg by mouth at bedtime.  0  . ibuprofen (ADVIL,MOTRIN) 800 MG tablet Take 1 tablet (800 mg total) by mouth every 8 (eight) hours as needed for mild pain. 30 tablet 1  . ondansetron (ZOFRAN-ODT) 8 MG disintegrating tablet Take 8 mg by mouth every 8 (eight) hours as needed for nausea/vomiting.  1  . traMADol (ULTRAM) 50 MG tablet Take 1 tablet (50 mg total) by mouth every 6 (six) hours as needed for moderate pain or severe pain. 120 tablet 0  . traZODone (DESYREL) 150 MG tablet Take 150 mg by mouth at bedtime.    . Vitamin D, Ergocalciferol, (DRISDOL) 50000 units CAPS capsule Take 50,000 Units by mouth every Monday.  1   No current facility-administered medications for this visit.    Allergies  Allergen Reactions  . Buprenorphine Hcl  Hives  . Morphine And Related Hives and Dermatitis     Review of Systems: Positive for anxiety, back pain, blood in urine, cough, muscle pain and cramps, hearing problems,  shortness of breath and sleeping problems All other systems reviewed and negative except where noted in HPI.    Physical Exam: BP (!) 142/74 (Cuff Size: Large)   Pulse 74   Ht 5\' 7"  (1.702 m)   Wt (!) 323 lb (146.5 kg)   BMI 50.59 kg/m  Constitutional:  Morbidly obese female in no acute distress. Psychiatric: Normal mood and affect. Behavior is normal. EENT: Pupils normal.  Conjunctivae are normal. No scleral icterus. Neck supple.  Cardiovascular: Normal rate, regular rhythm. No edema Pulmonary/chest: Effort normal and breath sounds normal. No wheezing, rales or rhonchi. Abdominal: Soft, nondistended. Nontender. Bowel sounds active throughout. There are no masses palpable. No hepatomegaly. Lymphadenopathy: No cervical adenopathy noted. Neurological: Alert and oriented to person place and time. Skin: Skin is warm and dry. No rashes noted.   ASSESSMENT AND PLAN:  1. 56 yo female with postprandial crampy loose stool, suspect IBS. She had a colonoscopy, possibly an EGD as well by Dr. Elnoria HowardHung a few years ago but I don't have the records -hyoscyamine doesn't help at all so will discontinue it.  -on daily questran for months, hasn't helped. Will discontinue - Trial of lomotil bid -Requested records from Dr. Elnoria HowardHung. -follow up with me in 2-3 weeks  Further recommendations following review of Hung's records and her resonse to Lotomil   2. Multiple medical problems not limitied to TIA, HTN, morbid obesity, and severe OSA  for which she is s/p elective trach   Willette ClusterPaula Guenther, NP  04/27/2017, 10:41 AM  Cc: Lindaann PascalLong, Scott, PA-C

## 2017-04-27 NOTE — Patient Instructions (Addendum)
If you are age 965 or older, your body mass index should be between 23-30. Your Body mass index is 50.59 kg/m. If this is out of the aforementioned range listed, please consider follow up with your Primary Care Provider.  If you are age 55 or younger, your body mass index should be between 19-25. Your Body mass index is 50.59 kg/m. If this is out of the aformentioned range listed, please consider follow up with your Primary Care Provider.   We have sent the following medications to your pharmacy for you to pick up at your convenience: Lomotil twice daily  STOP Questran STOP Hyoscyamine  We have requested records from  Dr. Elnoria HowardHung.  Follow up with me on 05/12/17 at 10:30 a.m.  Thank you for choosing me and [facility].   Willette ClusterPaula Guenther, NP

## 2017-05-01 NOTE — Progress Notes (Signed)
Agree with assessment and plan as outlined. Please let me know results of her prior workup when available. Thanks

## 2017-05-12 ENCOUNTER — Other Ambulatory Visit (INDEPENDENT_AMBULATORY_CARE_PROVIDER_SITE_OTHER): Payer: Medicaid Other

## 2017-05-12 ENCOUNTER — Ambulatory Visit (INDEPENDENT_AMBULATORY_CARE_PROVIDER_SITE_OTHER): Payer: Medicaid Other | Admitting: Nurse Practitioner

## 2017-05-12 ENCOUNTER — Encounter: Payer: Self-pay | Admitting: Nurse Practitioner

## 2017-05-12 VITALS — BP 114/72 | HR 68 | Ht 67.0 in | Wt 321.2 lb

## 2017-05-12 DIAGNOSIS — R1012 Left upper quadrant pain: Secondary | ICD-10-CM

## 2017-05-12 DIAGNOSIS — R110 Nausea: Secondary | ICD-10-CM

## 2017-05-12 DIAGNOSIS — K5900 Constipation, unspecified: Secondary | ICD-10-CM

## 2017-05-12 LAB — LIPASE: LIPASE: 27 U/L (ref 11.0–59.0)

## 2017-05-12 MED ORDER — OMEPRAZOLE 40 MG PO CPDR: 40.0000 mg | DELAYED_RELEASE_CAPSULE | Freq: Two times a day (BID) | ORAL | 2 refills | Status: DC

## 2017-05-12 NOTE — Patient Instructions (Signed)
If you are age 55 or older, your body mass index should be between 23-30. Your Body mass index is 50.31 kg/m. If this is out of the aforementioned range listed, please consider follow up with your Primary Care Provider.  If you are age 55 or younger, your body mass index should be between 19-25. Your Body mass index is 50.31 kg/m. If this is out of the aformentioned range listed, please consider follow up with your Primary Care Provider.   You have been scheduled for an endoscopy. Please follow written instructions given to you at your visit today. If you use inhalers (even only as needed), please bring them with you on the day of your procedure. Your physician has requested that you go to www.startemmi.com and enter the access code given to you at your visit today. This web site gives a general overview about your procedure. However, you should still follow specific instructions given to you by our office regarding your preparation for the procedure.  Your physician has requested that you go to the basement for the following lab work before leaving today: Lipase  We have sent the following medications to your pharmacy for you to pick up at your convenience: Omeprazole 40 mg  STOP Lomotil STOP Ibuprofen  Thank you for choosing me and [facility].   Willette ClusterPaula Guenther, NP

## 2017-05-12 NOTE — Progress Notes (Addendum)
Chief Complaint:  Abdominal pain  HPI: Patient is a 56 yo female with hx of DM, obesity, severe OSA s/p elective trach. I saw her couple of weeks ago for evaluation of chronic diarrhea associated with crampy abdominal pain. Questran wasn't working so I stopped it and started her on lomotil bid. Now having constipation passing little hard balls of stool .Everything she eats / drinks causing left mid abdominal pain radiating through to her back. If she doesn't eat then no pain but still nauseated. She is constantly nauseated. Taking ibuprofen for the abdominal pain.  Weight is down 2 pounds in last two weeks      Past Medical History:  Diagnosis Date  . Anemia   . Anemia   . Chronic pain   . CKD (chronic kidney disease)   . Diabetes mellitus without complication (HCC)    diet controlled. does not check CBG's  . Fibromyalgia    hospitilized 12/16 due to inability to walk  . Hyperlipemia   . Hypertension   . Insomnia   . Neuropathy   . Right sided weakness   . Stroke [facility](HCC) 2014?   no residual weakness   . Vitamin deficiency    Vit D    Patient's surgical history, family medical history, social history, medications and allergies were all reviewed in Epic    Physical Exam: BP 114/72   Pulse 68   Ht 5\' 7"  (1.702 m)   Wt (!) 321 lb 4 oz (145.7 kg)   BMI 50.31 kg/m   GENERAL:  Obese female in NAD PSYCH: :Pleasant, cooperative, normal affect EENT:  conjunctiva pink, mucous membranes moist, neck supple without masses CARDIAC:  RRR, no murmur heard, no peripheral edema PULM: Normal respiratory effort, lungs CTA bilaterally, no wheezing ABDOMEN:  Nondistended, soft, nontender. No obvious masses, no hepatomegaly,  normal bowel sounds SKIN:  turgor, no lesions seen Musculoskeletal:  Normal muscle tone, normal strength NEURO: Alert and oriented x 3, no focal neurologic deficits  ASSESSMENT and PLAN:  1. Pleasant 56 year old female with postprandial left mid abdominal pain   / nausea. When I saw her a few weeks ago she described more of a crampy left lower abdominal pain prior to BMs.  Stop ibuprofen Omeprazole 40 mg BID, 30 minutes before eating. CMET, lipase today Will schedule her tentavitvely for EGD in ~ 3 weeks at hospital . If improving she may opt to cancel a few days ahead of time.  -If EGD negative and sx persists, consider abdominal imaging.    2. Constipation. She was having chronic diarrhea when I saw her. Started lomotil a couple of weeks ago, no tending towards constipation. Suspect she has IBS -hold lomotil  Do not restart unless having diarrhea.   3. Obesity. BMI 50   Willette ClusterPaula Nayra Coury , NP 05/12/2017, 11:11 AM   Addendum 05/17/17 Received records from Dr. Jeani HawkingPatrick Hung. Patient was seen by him October 2016 for evaluation of diarrhea/fecal urgency after meals. He noted that patient had started a gluten-free diet and her symptoms had resolved. Prior to that visit patient had seen Dr. Elnoria HowardHung July 2013 for colon cancer screening. She was not having any GI problems at the time. Colonoscopy 02/24/12. Extent of exam to the cecum. The prep was excellent. 2 1 mm sessile cecal polyps were removed. A few scattered sigmoid diverticula were found. No other abnormalities. Cecal polyp path compatible with tubular adenomas without high-grade Dysplasia.  I will scan these into Epic and forward  this addendum to patient's primary GI, Dr. Adela Lank. Based on her history of adenomatous colon polyps in 2013 he may want to proceed with surveillance colonoscopy.

## 2017-05-13 ENCOUNTER — Telehealth: Payer: Self-pay | Admitting: Gastroenterology

## 2017-05-13 NOTE — Telephone Encounter (Signed)
Patient called states that her left sided abdominal pain is not going away. At last visit was told to stop NSAID's, she is only taking the omeprazole and zofran for the nausea. She reports having 2 bm's today, denies any blood in the stool. [facility]. She has seen Gunnar FusiPaula at last 2 office visits.

## 2017-05-13 NOTE — Telephone Encounter (Signed)
Patient advised of results. She will call back on Monday. She understands to go to ED if her symptoms worsen over the weekend.

## 2017-05-13 NOTE — Telephone Encounter (Signed)
Sounds like she is having ongoing chronic pain. Gunnar Fusiaula just started her on omeprazole 40mg  BID, this can take some time to take effect. Moving forward if her pain worsens or fails to improve I would recommend CT abdomen / pelvis. It's Friday afternoon, this would not get done electively until next week. If she worsens over the weekend with severe pain, the ER is her only option to get this done. I would recommend she await her course on high dose PPI. She can call us back the beginning of next week, if she continues to feel poorly we can coordinate a CT scan. I will discuss with Gunnar Fusiaula as I have not yet seen this patient yet. Thanks

## 2017-05-16 ENCOUNTER — Encounter: Payer: Self-pay | Admitting: Nurse Practitioner

## 2017-05-16 NOTE — Progress Notes (Signed)
Agree with assessment and plan as outlined. Yesenia [name] this patient called back for advice prior to you finishing your note.  Sounds like she is having ongoing chronic pain. She was just started her on omeprazole 40mg  BID, this can take some time to take effect. Moving forward if her pain worsens or fails to improve I would recommend CT abdomen / pelvis. I would recommend she await her course on high dose PPI. She can call us back thisweek, if she continues to feel poorly we can coordinate a CT scan. Yesenia [name] I think you ordered CMET and a lipase and only the lipase is done. If she continues to have pain she should have CMET and CBC if not yet done. Thanks

## 2017-05-20 ENCOUNTER — Telehealth: Payer: Self-pay | Admitting: Gastroenterology

## 2017-05-20 NOTE — Telephone Encounter (Signed)
Records received:  Colonoscopy 02/24/2012 - 2 small cecal adenomas removed  Patient is due for a surveillance colonoscopy at this time.  Raynelle FanningJulie, can you add a colonoscopy on to the patient's EGD she is having at the hospital on 12/24, if she is willing to proceed?  Jettie BoozePaula, FYI, I am adding colonoscopy.   Thanks

## 2017-05-23 ENCOUNTER — Other Ambulatory Visit: Payer: Self-pay

## 2017-05-23 MED ORDER — NA SULFATE-K SULFATE-MG SULF 17.5-3.13-1.6 GM/177ML PO SOLN: 1.0000 | ORAL | 0 refills | Status: DC

## 2017-05-23 NOTE — Telephone Encounter (Signed)
Great, thanks [name] 

## 2017-05-23 NOTE — Telephone Encounter (Signed)
Spoke to patient, she is willing to proceed with added on colonoscopy to 07/04/17 at [facility]. Called scheduling, added on colonoscopy with propofol. Will send patient prep instructions, have also sent Amy H. message about add on.

## 2017-05-23 NOTE — Telephone Encounter (Signed)
Left message to return my call.  

## 2017-05-23 NOTE — Telephone Encounter (Signed)
Pt is returning your call

## 2017-06-24 ENCOUNTER — Other Ambulatory Visit: Payer: Self-pay

## 2017-06-24 ENCOUNTER — Encounter (HOSPITAL_COMMUNITY): Payer: Self-pay | Admitting: Emergency Medicine

## 2017-06-28 ENCOUNTER — Telehealth: Payer: Self-pay | Admitting: Gastroenterology

## 2017-06-28 ENCOUNTER — Other Ambulatory Visit: Payer: Self-pay

## 2017-06-28 NOTE — Telephone Encounter (Signed)
Patient had to reschedule to 08/29/17 due to transportation issue. Mailed new prep instructions to her.

## 2017-06-30 ENCOUNTER — Other Ambulatory Visit: Payer: Self-pay | Admitting: Gastroenterology

## 2017-07-12 DIAGNOSIS — Z8719 Personal history of other diseases of the digestive system: Secondary | ICD-10-CM

## 2017-07-12 HISTORY — DX: Personal history of other diseases of the digestive system: Z87.19

## 2017-07-21 ENCOUNTER — Telehealth: Payer: Self-pay | Admitting: Gastroenterology

## 2017-07-21 NOTE — Telephone Encounter (Signed)
Routed to Dr. Armbruster. 

## 2017-07-22 NOTE — Telephone Encounter (Signed)
There were 2 studies last year which suggested an increased risk of gastric cancer in patients with long term use of PPIs, I'm not aware of any recent news about it in the past week, but will look into this. Like any medication, we want to use the lowest dose over time to control symptoms, and must weigh the risks / benefits of each medication and the potential side effects of it. I will be seeing her next month for EGD and colonoscopy - I can talk with her further about it at that time, based on EGD we will discuss if she needs to be on it or not. If she finds it is helping her symptoms, I would continue it. If she has not found any benefit, she can try stopping it.

## 2017-07-22 NOTE — Telephone Encounter (Signed)
Spoke to patient advised of Dr. Lanetta InchArmbruster's recommendations. Patient reassured and let her know that after the EGD next month he would discuss need for PPI further with her.

## 2017-08-10 ENCOUNTER — Telehealth: Payer: Self-pay | Admitting: Gastroenterology

## 2017-08-10 NOTE — Telephone Encounter (Signed)
Received a referral from Dr Lindaann PascalScott Long. Ms Yesenia [name] is already scheduled for an EGD @ Hospital. Dr Adela LankArmbruster reviewed notes and will see her for the scheduled EGD first. [facility].

## 2017-08-15 ENCOUNTER — Inpatient Hospital Stay (HOSPITAL_COMMUNITY)
Admission: EM | Admit: 2017-08-15 | Discharge: 2017-08-22 | DRG: 683 | Disposition: A | Discharge status: Other Acute Inpatient Hospital | Payer: Medicaid Other | Attending: Internal Medicine | Admitting: Internal Medicine

## 2017-08-15 ENCOUNTER — Emergency Department (HOSPITAL_COMMUNITY): Payer: Medicaid Other

## 2017-08-15 ENCOUNTER — Other Ambulatory Visit: Payer: Self-pay

## 2017-08-15 ENCOUNTER — Telehealth: Payer: Self-pay | Admitting: Gastroenterology

## 2017-08-15 ENCOUNTER — Encounter (HOSPITAL_COMMUNITY): Payer: Self-pay

## 2017-08-15 DIAGNOSIS — N179 Acute kidney failure, unspecified: Principal | ICD-10-CM | POA: Diagnosis present

## 2017-08-15 DIAGNOSIS — Z915 Personal history of self-harm: Secondary | ICD-10-CM | POA: Diagnosis not present

## 2017-08-15 DIAGNOSIS — I10 Essential (primary) hypertension: Secondary | ICD-10-CM

## 2017-08-15 DIAGNOSIS — Z803 Family history of malignant neoplasm of breast: Secondary | ICD-10-CM

## 2017-08-15 DIAGNOSIS — Z930 Tracheostomy status: Secondary | ICD-10-CM | POA: Diagnosis not present

## 2017-08-15 DIAGNOSIS — K2970 Gastritis, unspecified, without bleeding: Secondary | ICD-10-CM | POA: Diagnosis not present

## 2017-08-15 DIAGNOSIS — E1122 Type 2 diabetes mellitus with diabetic chronic kidney disease: Secondary | ICD-10-CM | POA: Diagnosis present

## 2017-08-15 DIAGNOSIS — K269 Duodenal ulcer, unspecified as acute or chronic, without hemorrhage or perforation: Secondary | ICD-10-CM | POA: Diagnosis not present

## 2017-08-15 DIAGNOSIS — F332 Major depressive disorder, recurrent severe without psychotic features: Secondary | ICD-10-CM | POA: Diagnosis present

## 2017-08-15 DIAGNOSIS — R45851 Suicidal ideations: Secondary | ICD-10-CM | POA: Diagnosis present

## 2017-08-15 DIAGNOSIS — M549 Dorsalgia, unspecified: Secondary | ICD-10-CM | POA: Diagnosis not present

## 2017-08-15 DIAGNOSIS — F419 Anxiety disorder, unspecified: Secondary | ICD-10-CM | POA: Diagnosis present

## 2017-08-15 DIAGNOSIS — M797 Fibromyalgia: Secondary | ICD-10-CM | POA: Diagnosis present

## 2017-08-15 DIAGNOSIS — R42 Dizziness and giddiness: Secondary | ICD-10-CM

## 2017-08-15 DIAGNOSIS — Z888 Allergy status to other drugs, medicaments and biological substances status: Secondary | ICD-10-CM

## 2017-08-15 DIAGNOSIS — E119 Type 2 diabetes mellitus without complications: Secondary | ICD-10-CM | POA: Diagnosis not present

## 2017-08-15 DIAGNOSIS — K219 Gastro-esophageal reflux disease without esophagitis: Secondary | ICD-10-CM | POA: Diagnosis present

## 2017-08-15 DIAGNOSIS — N3000 Acute cystitis without hematuria: Secondary | ICD-10-CM | POA: Diagnosis present

## 2017-08-15 DIAGNOSIS — Z6841 Body Mass Index (BMI) 40.0 and over, adult: Secondary | ICD-10-CM | POA: Diagnosis not present

## 2017-08-15 DIAGNOSIS — E785 Hyperlipidemia, unspecified: Secondary | ICD-10-CM | POA: Diagnosis present

## 2017-08-15 DIAGNOSIS — R197 Diarrhea, unspecified: Secondary | ICD-10-CM | POA: Diagnosis not present

## 2017-08-15 DIAGNOSIS — Z7984 Long term (current) use of oral hypoglycemic drugs: Secondary | ICD-10-CM

## 2017-08-15 DIAGNOSIS — Z736 Limitation of activities due to disability: Secondary | ICD-10-CM | POA: Diagnosis not present

## 2017-08-15 DIAGNOSIS — R634 Abnormal weight loss: Secondary | ICD-10-CM | POA: Diagnosis present

## 2017-08-15 DIAGNOSIS — N390 Urinary tract infection, site not specified: Secondary | ICD-10-CM | POA: Diagnosis not present

## 2017-08-15 DIAGNOSIS — D631 Anemia in chronic kidney disease: Secondary | ICD-10-CM | POA: Diagnosis present

## 2017-08-15 DIAGNOSIS — R450 Nervousness: Secondary | ICD-10-CM | POA: Diagnosis not present

## 2017-08-15 DIAGNOSIS — K253 Acute gastric ulcer without hemorrhage or perforation: Secondary | ICD-10-CM

## 2017-08-15 DIAGNOSIS — R109 Unspecified abdominal pain: Secondary | ICD-10-CM | POA: Diagnosis not present

## 2017-08-15 DIAGNOSIS — G8929 Other chronic pain: Secondary | ICD-10-CM | POA: Diagnosis present

## 2017-08-15 DIAGNOSIS — E876 Hypokalemia: Secondary | ICD-10-CM | POA: Diagnosis present

## 2017-08-15 DIAGNOSIS — K259 Gastric ulcer, unspecified as acute or chronic, without hemorrhage or perforation: Secondary | ICD-10-CM | POA: Diagnosis present

## 2017-08-15 DIAGNOSIS — R1013 Epigastric pain: Secondary | ICD-10-CM | POA: Diagnosis not present

## 2017-08-15 DIAGNOSIS — I129 Hypertensive chronic kidney disease with stage 1 through stage 4 chronic kidney disease, or unspecified chronic kidney disease: Secondary | ICD-10-CM | POA: Diagnosis present

## 2017-08-15 DIAGNOSIS — D649 Anemia, unspecified: Secondary | ICD-10-CM | POA: Diagnosis not present

## 2017-08-15 DIAGNOSIS — R112 Nausea with vomiting, unspecified: Secondary | ICD-10-CM | POA: Diagnosis not present

## 2017-08-15 DIAGNOSIS — Z23 Encounter for immunization: Secondary | ICD-10-CM | POA: Diagnosis not present

## 2017-08-15 DIAGNOSIS — G4733 Obstructive sleep apnea (adult) (pediatric): Secondary | ICD-10-CM | POA: Diagnosis present

## 2017-08-15 DIAGNOSIS — R1310 Dysphagia, unspecified: Secondary | ICD-10-CM | POA: Diagnosis present

## 2017-08-15 DIAGNOSIS — Z806 Family history of leukemia: Secondary | ICD-10-CM

## 2017-08-15 DIAGNOSIS — Z8673 Personal history of transient ischemic attack (TIA), and cerebral infarction without residual deficits: Secondary | ICD-10-CM

## 2017-08-15 DIAGNOSIS — Z885 Allergy status to narcotic agent status: Secondary | ICD-10-CM

## 2017-08-15 DIAGNOSIS — R441 Visual hallucinations: Secondary | ICD-10-CM | POA: Diagnosis present

## 2017-08-15 DIAGNOSIS — G4700 Insomnia, unspecified: Secondary | ICD-10-CM | POA: Diagnosis present

## 2017-08-15 DIAGNOSIS — E118 Type 2 diabetes mellitus with unspecified complications: Secondary | ICD-10-CM

## 2017-08-15 DIAGNOSIS — E1140 Type 2 diabetes mellitus with diabetic neuropathy, unspecified: Secondary | ICD-10-CM | POA: Diagnosis present

## 2017-08-15 DIAGNOSIS — S37009A Unspecified injury of unspecified kidney, initial encounter: Secondary | ICD-10-CM | POA: Diagnosis present

## 2017-08-15 DIAGNOSIS — N183 Chronic kidney disease, stage 3 (moderate): Secondary | ICD-10-CM | POA: Diagnosis present

## 2017-08-15 DIAGNOSIS — E860 Dehydration: Secondary | ICD-10-CM | POA: Diagnosis present

## 2017-08-15 DIAGNOSIS — E559 Vitamin D deficiency, unspecified: Secondary | ICD-10-CM | POA: Diagnosis present

## 2017-08-15 DIAGNOSIS — F1021 Alcohol dependence, in remission: Secondary | ICD-10-CM | POA: Diagnosis not present

## 2017-08-15 HISTORY — DX: Depression, unspecified: F32.A

## 2017-08-15 HISTORY — DX: Acute kidney failure, unspecified: N17.9

## 2017-08-15 HISTORY — DX: Type 2 diabetes mellitus without complications: E11.9

## 2017-08-15 HISTORY — DX: Sleep apnea, unspecified: G47.30

## 2017-08-15 HISTORY — DX: Acute gastric ulcer without hemorrhage or perforation: K25.3

## 2017-08-15 HISTORY — DX: Unspecified osteoarthritis, unspecified site: M19.90

## 2017-08-15 HISTORY — DX: Major depressive disorder, single episode, unspecified: F32.9

## 2017-08-15 LAB — COMPREHENSIVE METABOLIC PANEL
ALK PHOS: 111 U/L (ref 38–126)
ALT: 21 U/L (ref 14–54)
AST: 21 U/L (ref 15–41)
Albumin: 3.9 g/dL (ref 3.5–5.0)
Anion gap: 14 (ref 5–15)
BUN: 13 mg/dL (ref 6–20)
CHLORIDE: 107 mmol/L (ref 101–111)
CO2: 21 mmol/L — ABNORMAL LOW (ref 22–32)
CREATININE: 1.54 mg/dL — AB (ref 0.44–1.00)
Calcium: 9 mg/dL (ref 8.9–10.3)
GFR calc Af Amer: 42 mL/min — ABNORMAL LOW (ref >60)
GFR, EST NON AFRICAN AMERICAN: 37 mL/min — AB (ref >60)
Glucose, Bld: 140 mg/dL — ABNORMAL HIGH (ref 65–99)
Potassium: 3.4 mmol/L — ABNORMAL LOW (ref 3.5–5.1)
Sodium: 142 mmol/L (ref 135–145)
Total Bilirubin: 0.5 mg/dL (ref 0.3–1.2)
Total Protein: 7.6 g/dL (ref 6.5–8.1)

## 2017-08-15 LAB — CBC WITH DIFFERENTIAL/PLATELET
BASOS ABS: 0.1 10*3/uL (ref 0.0–0.1)
Basophils Relative: 1 %
EOS PCT: 4 %
Eosinophils Absolute: 0.3 10*3/uL (ref 0.0–0.7)
HCT: 37.3 % (ref 36.0–46.0)
HEMOGLOBIN: 12.2 g/dL (ref 12.0–15.0)
LYMPHS PCT: 40 %
Lymphs Abs: 3.2 10*3/uL (ref 0.7–4.0)
MCH: 26.8 pg (ref 26.0–34.0)
MCHC: 32.7 g/dL (ref 30.0–36.0)
MCV: 82 fL (ref 78.0–100.0)
Monocytes Absolute: 0.5 10*3/uL (ref 0.1–1.0)
Monocytes Relative: 7 %
NEUTROS ABS: 3.9 10*3/uL (ref 1.7–7.7)
NEUTROS PCT: 48 %
PLATELETS: 374 10*3/uL (ref 150–400)
RBC: 4.55 MIL/uL (ref 3.87–5.11)
RDW: 13.7 % (ref 11.5–15.5)
WBC: 8 10*3/uL (ref 4.0–10.5)

## 2017-08-15 LAB — LIPASE, BLOOD: LIPASE: 40 U/L (ref 11–51)

## 2017-08-15 LAB — URINALYSIS, ROUTINE W REFLEX MICROSCOPIC
BILIRUBIN URINE: NEGATIVE
Glucose, UA: NEGATIVE mg/dL
KETONES UR: NEGATIVE mg/dL
Nitrite: POSITIVE — AB
Protein, ur: 30 mg/dL — AB
Specific Gravity, Urine: 1.017 (ref 1.005–1.030)
pH: 5 (ref 5.0–8.0)

## 2017-08-15 MED ORDER — SODIUM CHLORIDE 0.9 % IV BOLUS (SEPSIS)
1000.0000 mL | Freq: Once | INTRAVENOUS | Status: AC
  Administered 2017-08-16: 1000 mL via INTRAVENOUS

## 2017-08-15 MED ORDER — HYDROMORPHONE HCL 1 MG/ML IJ SOLN
1.0000 mg | Freq: Once | INTRAMUSCULAR | Status: AC
  Administered 2017-08-15: 1 mg via INTRAVENOUS
  Filled 2017-08-15: qty 1

## 2017-08-15 MED ORDER — DEXTROSE 5 % IV SOLN
1.0000 g | Freq: Once | INTRAVENOUS | Status: AC
  Administered 2017-08-16: 1 g via INTRAVENOUS
  Filled 2017-08-15: qty 10

## 2017-08-15 MED ORDER — IOPAMIDOL (ISOVUE-300) INJECTION 61%
INTRAVENOUS | Status: AC
Start: 2017-08-15 — End: 2017-08-15
  Administered 2017-08-15: 75 mL
  Filled 2017-08-15: qty 75

## 2017-08-15 MED ORDER — BUSPIRONE HCL 15 MG PO TABS
15.0000 mg | ORAL_TABLET | Freq: Two times a day (BID) | ORAL | Status: DC
  Administered 2017-08-16 – 2017-08-22 (×14): 15 mg via ORAL
  Filled 2017-08-15: qty 1
  Filled 2017-08-15: qty 3
  Filled 2017-08-15: qty 1
  Filled 2017-08-15: qty 3
  Filled 2017-08-15: qty 1
  Filled 2017-08-15 (×3): qty 3
  Filled 2017-08-15: qty 1
  Filled 2017-08-15 (×3): qty 3
  Filled 2017-08-15: qty 1
  Filled 2017-08-15: qty 3

## 2017-08-15 MED ORDER — INSULIN ASPART 100 UNIT/ML ~~LOC~~ SOLN: 0.0000 [IU] | Freq: Every day | SUBCUTANEOUS | Status: DC

## 2017-08-15 MED ORDER — ATENOLOL 50 MG PO TABS
50.0000 mg | ORAL_TABLET | Freq: Every day | ORAL | Status: DC
  Administered 2017-08-16 – 2017-08-22 (×7): 50 mg via ORAL
  Filled 2017-08-15 (×7): qty 1

## 2017-08-15 MED ORDER — ALBUTEROL SULFATE (2.5 MG/3ML) 0.083% IN NEBU
2.5000 mg | INHALATION_SOLUTION | Freq: Four times a day (QID) | RESPIRATORY_TRACT | Status: DC | PRN
  Administered 2017-08-22: 2.5 mg via RESPIRATORY_TRACT
  Filled 2017-08-15: qty 3

## 2017-08-15 MED ORDER — ONDANSETRON HCL 4 MG/2ML IJ SOLN: 4.0000 mg | Freq: Four times a day (QID) | INTRAMUSCULAR | Status: DC | PRN

## 2017-08-15 MED ORDER — ACETAMINOPHEN 325 MG PO TABS
650.0000 mg | ORAL_TABLET | Freq: Four times a day (QID) | ORAL | Status: DC | PRN
  Administered 2017-08-16 – 2017-08-21 (×6): 650 mg via ORAL
  Filled 2017-08-15 (×6): qty 2

## 2017-08-15 MED ORDER — INSULIN ASPART 100 UNIT/ML ~~LOC~~ SOLN
0.0000 [IU] | Freq: Four times a day (QID) | SUBCUTANEOUS | Status: DC
  Administered 2017-08-16 (×2): 1 [IU] via SUBCUTANEOUS
  Administered 2017-08-17: 2 [IU] via SUBCUTANEOUS
  Administered 2017-08-18 – 2017-08-19 (×3): 1 [IU] via SUBCUTANEOUS
  Administered 2017-08-19 (×2): 2 [IU] via SUBCUTANEOUS
  Administered 2017-08-20 (×2): 1 [IU] via SUBCUTANEOUS

## 2017-08-15 MED ORDER — DEXTROSE-NACL 5-0.45 % IV SOLN
INTRAVENOUS | Status: DC
  Administered 2017-08-16: 01:00:00 via INTRAVENOUS

## 2017-08-15 MED ORDER — ONDANSETRON HCL 4 MG PO TABS
4.0000 mg | ORAL_TABLET | Freq: Four times a day (QID) | ORAL | Status: DC | PRN
  Filled 2017-08-15: qty 1

## 2017-08-15 MED ORDER — HYOSCYAMINE SULFATE 0.125 MG SL SUBL
0.1250 mg | SUBLINGUAL_TABLET | Freq: Four times a day (QID) | SUBLINGUAL | Status: DC | PRN
  Administered 2017-08-22: 0.125 mg via ORAL
  Filled 2017-08-15 (×3): qty 1

## 2017-08-15 MED ORDER — PROMETHAZINE HCL 25 MG/ML IJ SOLN
25.0000 mg | Freq: Once | INTRAMUSCULAR | Status: AC
  Administered 2017-08-15: 25 mg via INTRAVENOUS
  Filled 2017-08-15: qty 1

## 2017-08-15 MED ORDER — CLONAZEPAM 1 MG PO TABS
1.0000 mg | ORAL_TABLET | Freq: Every day | ORAL | Status: DC
  Administered 2017-08-16 – 2017-08-21 (×7): 1 mg via ORAL
  Filled 2017-08-15 (×7): qty 1

## 2017-08-15 MED ORDER — AMLODIPINE BESYLATE 10 MG PO TABS
10.0000 mg | ORAL_TABLET | Freq: Every day | ORAL | Status: DC
  Administered 2017-08-16 – 2017-08-22 (×7): 10 mg via ORAL
  Filled 2017-08-15 (×7): qty 1

## 2017-08-15 MED ORDER — HYDROXYZINE HCL 25 MG PO TABS
50.0000 mg | ORAL_TABLET | Freq: Every day | ORAL | Status: DC
  Administered 2017-08-16 – 2017-08-21 (×7): 50 mg via ORAL
  Filled 2017-08-15 (×7): qty 2

## 2017-08-15 MED ORDER — DEXTROSE 5 % IV SOLN
1.0000 g | INTRAVENOUS | Status: DC
  Administered 2017-08-16 – 2017-08-21 (×6): 1 g via INTRAVENOUS
  Filled 2017-08-15 (×6): qty 10

## 2017-08-15 MED ORDER — DOCUSATE SODIUM 100 MG PO CAPS
100.0000 mg | ORAL_CAPSULE | Freq: Two times a day (BID) | ORAL | Status: DC
  Administered 2017-08-16 – 2017-08-22 (×11): 100 mg via ORAL
  Filled 2017-08-15 (×14): qty 1

## 2017-08-15 MED ORDER — ROSUVASTATIN CALCIUM 10 MG PO TABS
10.0000 mg | ORAL_TABLET | Freq: Every day | ORAL | Status: DC
  Administered 2017-08-16 – 2017-08-22 (×7): 10 mg via ORAL
  Filled 2017-08-15 (×7): qty 1

## 2017-08-15 MED ORDER — IOPAMIDOL (ISOVUE-300) INJECTION 61%
INTRAVENOUS | Status: AC
  Filled 2017-08-15: qty 75

## 2017-08-15 MED ORDER — GABAPENTIN 300 MG PO CAPS
900.0000 mg | ORAL_CAPSULE | Freq: Every day | ORAL | Status: DC
  Administered 2017-08-16 – 2017-08-21 (×7): 900 mg via ORAL
  Filled 2017-08-15 (×8): qty 3

## 2017-08-15 MED ORDER — PANTOPRAZOLE SODIUM 40 MG PO TBEC
40.0000 mg | DELAYED_RELEASE_TABLET | Freq: Every day | ORAL | Status: DC
  Administered 2017-08-16: 40 mg via ORAL
  Filled 2017-08-15: qty 1

## 2017-08-15 MED ORDER — SODIUM CHLORIDE 0.9 % IV BOLUS (SEPSIS)
1000.0000 mL | Freq: Once | INTRAVENOUS | Status: AC
  Administered 2017-08-15: 1000 mL via INTRAVENOUS

## 2017-08-15 MED ORDER — DULOXETINE HCL 30 MG PO CPEP
30.0000 mg | ORAL_CAPSULE | Freq: Every day | ORAL | Status: DC
  Administered 2017-08-16 – 2017-08-22 (×7): 30 mg via ORAL
  Filled 2017-08-15 (×7): qty 1

## 2017-08-15 MED ORDER — ONDANSETRON 4 MG PO TBDP
4.0000 mg | ORAL_TABLET | Freq: Once | ORAL | Status: AC
  Administered 2017-08-15: 4 mg via ORAL
  Filled 2017-08-15: qty 1

## 2017-08-15 MED ORDER — ACETAMINOPHEN 650 MG RE SUPP: 650.0000 mg | Freq: Four times a day (QID) | RECTAL | Status: DC | PRN

## 2017-08-15 NOTE — ED Provider Notes (Signed)
Patient placed in Quick Look pathway, seen and evaluated   Chief Complaint: abd pain, weight loss, nausea, diarrhea  HPI:   57 year old African American female presents to the ED with generalized abdominal pain, nausea, emesis, diarrhea and weight loss.  Patient with history of same for the past year but progressively worsening over the past 2 weeks.  Reports diffuse abdominal pain that is worse with food.  Also reports nausea and emesis.  Reports associated diarrhea.  She also reports a 15 pound weight loss in the past month due to poor appetite, vomiting and diarrhea.  Patient has seen GI and has endoscopy and colonoscopy scheduled in 2 weeks however since patient symptoms are worse and they referred her to the ED today for further evaluation.  Patient is not able to keep anything down by mouth for the past 24-48 hours.  Nothing makes her symptoms better or worse.  She has been taking Zofran at home without any relief.  Has not taking medications today for her pain or nausea.  She also reports history of elevated liver enzymes.  Denies any chronic NSAID use, Tylenol use, alcohol use.  ROS: Reports associated abdominal pain, nausea, emesis, weight loss, poor p.o. intake.  Denies any associated fevers, urinary symptoms, vaginal symptoms, melena, hematochezia.  (one)  Physical Exam:   Gen: No distress  Neuro: Awake and Alert  Skin: Warm    Focused Exam: Generalized abdominal pain to palpation in all 4 quadrants.   Decreased bowel sounds.  No signs of peritonitis.  No CVA tenderness.  Abdomen appears normal.  Obese abdomen noted.  Negative Murphy sign.  Negative McBurney's point.  No distention noted.  Abdomen is soft.   Initiation of care has begun. The patient has been counseled on the process, plan, and necessity for staying for the completion/evaluation, and the remainder of the medical screening examination.  Given patient's weight loss and prolonged symptoms will order basic lab work including  lipase and liver enzymes.  We will also order CT abdomen pelvis given her abdominal tenderness.  Patient given ODT Zofran.   Rise MuLeaphart, Chritopher Coster T, PA-C 08/15/17 1603    Margarita Grizzleay, Danielle, MD 08/16/17 [phone]

## 2017-08-15 NOTE — ED Triage Notes (Signed)
PT and Family report the ABD pain has been going on for months and months. Family reports Pt can not eat and is having Hallucinations.

## 2017-08-15 NOTE — ED Provider Notes (Signed)
[facility] PROGRESSIVE CARE Provider Note   CSN: [id number] Arrival date & time: 08/15/17  1344     History   Chief Complaint Chief Complaint  Patient presents with  . Abdominal Pain    HPI Yesenia [name] is a 57 y.o. female with a h/o of DM Type II, CKD, HLD, HTN, CVA, and OSA the emergency department with multiple complaints.  The patient reports worsening dysphagia with food and pills over the last few months unassociated a 14 pound weight loss over the last 2 months.  She reports 2-3 episodes of nonbloody nonbilious emesis daily and multiple episodes of diarrhea that began about 5-10 minutes after she eats any food.  She reports constant nausea and has been treating her symptoms at home with Zofran, but has continued to have emesis.  She denies melena, hematochezia, hematemesis, fever, or chills.  She also reports lightheadedness that is worse with standing and generalized weakness that is significantly worsened over the past 2-3 weeks.  The patient's daughter reports that she was barely able to help her mom get to the car today.  The patient reports that she is required holding onto objects or cart at the grocery store to be able to walk without falling over the past few weeks.  She also endorses a left-sided headache that began over the last 2-3 weeks with associated blurred vision, dysequilibrium, and decreased hearing bilaterally.  Her daughter also reports that she has been complaining of visual hallucinations since the headache began.  She states I keep seeing shadows moving in my house.  She denies auditory hallucinations.  No tinnitus, otalgia, or dizziness.  Her daughter also reports that she is intermittently been more confused over the last few weeks.  Her daughter also reports that her mother had a h/o of two previous suicide attempts approximately 18 years ago.  She reports that the patient has a long-standing history of depression, but reports worsening suicidal ideation  over the last few days.  No  Past medical history includes a tracheostomy secondary to OSA that was removed in November 2018.  She is followed by Dr. Havery Moros with GI and has an EGD scheduled on February 18.  The history is provided by the patient and a relative. No language interpreter was used.    Past Medical History:  Diagnosis Date  . Anemia   . Anemia   . Chronic pain   . CKD (chronic kidney disease)   . Diabetes mellitus without complication (HCC)    diet controlled. does not check CBG's  . Fibromyalgia    hospitilized 12/16 due to inability to walk  . Hyperlipemia   . Hypertension   . Insomnia   . Neuropathy   . Right sided weakness   . Stroke [facility]) 2014?   no residual weakness   . Vitamin deficiency    Vit D    Patient Active Problem List   Diagnosis Date Noted  . AKI (acute kidney injury) ([place]) 08/15/2017  . Sleep apnea 03/20/2017  . Acute on chronic respiratory failure with hypoxemia ([place]) 09/30/2016  . Acute tracheitis 09/30/2016  . Abscess   . Complication of tracheostomy ([place]) 06/23/2016  . Bleeding 06/23/2016  . Iron deficiency anemia 05/26/2016  . Tracheostomy status ([place]) 05/23/2016  . Irritable bowel syndrome 05/21/2016  . Anxiety state 05/21/2016  . Tracheostomy dependent ([place])   . Generalized weakness 04/13/2016  . Weakness generalized 04/13/2016  . HCAP (healthcare-associated pneumonia) 03/06/2016  . Fibromyalgia 02/22/2016  . Cellulitis 02/21/2016  .  Abdominal pain 02/16/2016  . Constipation 02/16/2016  . CKD (chronic kidney disease) stage 3, GFR 30-59 ml/min (HCC) 02/16/2016  . Fever   . Cough   . OSA (obstructive sleep apnea) 02/13/2016  . Insomnia 08/10/2015  . Polyneuropathy 07/10/2015  . GERD (gastroesophageal reflux disease) 07/10/2015  . Acute kidney injury superimposed on chronic kidney disease ([place]) 07/07/2015  . Gait disturbance 07/07/2015  . Morbid (severe) obesity due to excess calories ([place]) complicated by OSA 41/32/4401    . Left hip pain 07/06/2015  . Chronic pain   . Right sided weakness   . Headache(784.0) 04/14/2013  . Encephalopathy acute 04/11/2013  . Type 2 diabetes mellitus with stage 3 chronic kidney disease, without long-term current use of insulin ([place]) 04/11/2013  . Dyslipidemia 04/11/2013  . Chest pain 02/15/2013  . Cerebral infarction ([place]) 01/14/2013  . Essential hypertension 01/14/2013  . Anemia 01/14/2013    Past Surgical History:  Procedure Laterality Date  . [place]  . LEG SURGERY Right 2007   Surgery x3 2 arthroscopies and one rod  . OOPHORECTOMY Right 1993?  . TRACHEOSTOMY TUBE PLACEMENT N/A 02/13/2016   Procedure: TRACHEOSTOMY;  Surgeon: Melida Quitter, MD;  Location: [place];  Service: ENT;  Laterality: N/A;    OB History    No data available       Home Medications    Prior to Admission medications   Medication Sig Start Date End Date Taking? Authorizing Provider  albuterol (PROVENTIL HFA;VENTOLIN HFA) 108 (90 BASE) MCG/ACT inhaler Inhale 2 puffs into the lungs every 6 (six) hours as needed for wheezing or shortness of breath.    Yes [provider]  amLODipine (NORVASC) 10 MG tablet Take 10 mg by mouth daily. 09/07/16  Yes [provider]  atenolol (TENORMIN) 50 MG tablet Take 50 mg by mouth daily. 02/16/16  Yes [provider]  busPIRone (BUSPAR) 15 MG tablet Take 15 mg by mouth 2 (two) times daily. 08/01/17  Yes [provider]  clonazePAM (KLONOPIN) 1 MG tablet Take 1 tablet (1 mg total) by mouth at bedtime. 06/28/16  Yes Lauree Chandler, NP  DULoxetine (CYMBALTA) 30 MG capsule Take 30 mg by mouth daily.   Yes [provider]  gabapentin (NEURONTIN) 300 MG capsule Take 900 mg by mouth at bedtime. 08/01/17  Yes [provider]  hydrOXYzine (ATARAX/VISTARIL) 50 MG tablet Take 50 mg by mouth at bedtime. 02/09/17  Yes [provider]  hyoscyamine (LEVSIN SL) 0.125 MG SL tablet TAKE 0.125 mg  TABLET BY MOUTH FOUR TIMES DAILY FOR IBS 04/15/17  Yes [provider]  ibuprofen (ADVIL,MOTRIN) 800 MG tablet Take 800 mg by mouth every 8 (eight) hours as needed for headache or moderate pain.   Yes [provider]  metFORMIN (GLUCOPHAGE) 500 MG tablet Take 500 mg by mouth daily. 07/19/17  Yes [provider]  omeprazole (PRILOSEC) 40 MG capsule Take 1 capsule (40 mg total) by mouth 2 (two) times daily before a meal. 05/12/17  Yes Armbruster, Carlota Raspberry, MD  ondansetron (ZOFRAN-ODT) 4 MG disintegrating tablet Take 4 mg by mouth every 8 (eight) hours as needed for nausea or vomiting.  02/21/17  Yes [provider]  Polyethylene Glycol 3350 (PEG 3350) POWD Take 17 g by mouth daily as needed. 11/02/16  Yes [provider]  rosuvastatin (CRESTOR) 10 MG tablet Take 10 mg by mouth daily.   Yes [provider]  Vitamin D, Ergocalciferol, (DRISDOL) 50000 units  CAPS capsule Take 50,000 Units by mouth every Monday. 02/18/17  Yes [provider]  Na Sulfate-K Sulfate-Mg Sulf 17.5-3.13-1.6 GM/177ML SOLN Take 1 kit as directed by mouth. 05/23/17   Armbruster, Carlota Raspberry, MD  traMADol (ULTRAM) 50 MG tablet Take 1 tablet (50 mg total) by mouth every 6 (six) hours as needed for moderate pain or severe pain. Patient not taking: Reported on 06/27/2017 06/28/16   Lauree Chandler, NP    Family History Family History  Problem Relation Age of Onset  . Breast cancer Mother   . Leukemia Brother   . Breast cancer Maternal Grandmother     Social History Social History   Tobacco Use  . Smoking status: Never Smoker  . Smokeless tobacco: Never Used  Substance Use Topics  . Alcohol use: No    Alcohol/week: 0.0 oz  . Drug use: No     Allergies   Buprenorphine hcl and Morphine and related   Review of Systems Review of Systems  Constitutional: Positive for appetite change and unexpected weight change. Negative for activity change, chills and fever.    HENT: Positive for trouble swallowing. Negative for congestion, facial swelling, rhinorrhea and tinnitus.   Eyes: Negative for visual disturbance.  Respiratory: Negative for shortness of breath.   Cardiovascular: Negative for chest pain.  Gastrointestinal: Positive for abdominal pain, diarrhea, nausea and vomiting.  Genitourinary: Negative for dysuria, vaginal bleeding, vaginal discharge and vaginal pain.  Musculoskeletal: Negative for back pain, neck pain and neck stiffness.  Skin: Negative for rash.  Allergic/Immunologic: Positive for immunocompromised state.  Neurological: Positive for weakness, light-headedness and headaches. Negative for dizziness.  Hematological: Does not bruise/bleed easily.  Psychiatric/Behavioral: Positive for confusion and suicidal ideas.     Physical Exam Updated Vital Signs BP 99/62 (BP Location: Right Arm)   Pulse 67   Temp 98 F (36.7 C) (Oral)   Resp 18   Ht '5\' 7"'  (1.702 m)   Wt (!) 141.5 kg (312 lb)   SpO2 100%   BMI 48.87 kg/m   Physical Exam  Constitutional: She is oriented to person, place, and time. No distress.  Obese chronically ill-appearing female.  HENT:  Head: Normocephalic.  Right Ear: External ear normal.  Left Ear: External ear normal.  Eyes: Conjunctivae are normal.  Neck: Normal range of motion. Neck supple. No tracheal deviation present. No thyromegaly present.  Well-healed wound at the base of the anterior neck consistent with previous tracheostomy site.  No surrounding erythema, edema, warmth, or ecchymosis.  Cardiovascular: Normal rate, regular rhythm and intact distal pulses. Exam reveals no gallop and no friction rub.  No murmur heard. Pulmonary/Chest: Effort normal and breath sounds normal. No stridor. No respiratory distress. She has no wheezes. She has no rales. She exhibits no tenderness.  Abdominal: Soft. She exhibits no distension and no mass. There is tenderness. There is no rebound and no guarding. No hernia.   Generalized abdominal tenderness without focal palpation.  No CVA tenderness bilaterally.  No tenderness over McBurney's point.  Negative Murphy sign.  No peritoneal signs.  Obese abdomen.  Musculoskeletal: Normal range of motion. She exhibits no edema, tenderness or deformity.  Lymphadenopathy:    She has no cervical adenopathy.  Neurological: She is alert and oriented to person, place, and time.  Cranial nerves 2-12 grossly intact. Finger-to-nose is normal. 5/5 motor strength of the bilateral upper and lower extremities. Moves all four extremities. NVI.    Skin: Skin is warm. Capillary refill takes less than 2  seconds. No rash noted.  Psychiatric: She expresses suicidal ideation.  Nursing note and vitals reviewed.  ED Treatments / Results  Labs (all labs ordered are listed, but only abnormal results are displayed) Labs Reviewed  COMPREHENSIVE METABOLIC PANEL - Abnormal; Notable for the following components:      Result Value   Potassium 3.4 (*)    CO2 21 (*)    Glucose, Bld 140 (*)    Creatinine, Ser 1.54 (*)    GFR calc non Af Amer 37 (*)    GFR calc Af Amer 42 (*)    All other components within normal limits  URINALYSIS, ROUTINE W REFLEX MICROSCOPIC - Abnormal; Notable for the following components:   APPearance CLOUDY (*)    Hgb urine dipstick SMALL (*)    Protein, ur 30 (*)    Nitrite POSITIVE (*)    Leukocytes, UA LARGE (*)    Bacteria, UA MANY (*)    Squamous Epithelial / LPF 6-30 (*)    All other components within normal limits  GLUCOSE, CAPILLARY - Abnormal; Notable for the following components:   Glucose-Capillary 126 (*)    All other components within normal limits  URINE CULTURE  CBC WITH DIFFERENTIAL/PLATELET  LIPASE, BLOOD  HIV ANTIBODY (ROUTINE TESTING)  COMPREHENSIVE METABOLIC PANEL  CBC  PROTIME-INR  APTT  TSH  VITAMIN B12  FOLATE RBC  AMMONIA    EKG  EKG Interpretation  Date/Time:  Monday August 15 2017 20:33:37 EST Ventricular Rate:  61 PR  Interval:    QRS Duration: 140 QT Interval:  547 QTC Calculation: 552 R Axis:   4 Text Interpretation:  Sinus rhythm IVCD, consider atypical RBBB LVH with secondary repolarization abnormality Prolonged QT interval When compared with ECG of 04/12/2016, QT has lengthened Confirmed by Delora Fuel ([id number]) on 08/15/2017 11:21:38 PM       Radiology Dg Chest 2 View  Result Date: 08/15/2017 CLINICAL DATA:  Subacute onset of generalized abdominal pain, chest pain and dizziness. EXAM: CHEST  2 VIEW COMPARISON:  Chest radiograph performed 03/19/2017 FINDINGS: The lungs are well-aerated. Minimal right basilar atelectasis is noted. There is no evidence of pleural effusion or pneumothorax. The heart is borderline normal in size. No acute osseous abnormalities are seen. IMPRESSION: Minimal right basilar atelectasis noted.  Lungs otherwise clear. Electronically Signed   By: Garald Balding M.D.   On: 08/15/2017 21:45   Ct Head Wo Contrast  Result Date: 08/15/2017 CLINICAL DATA:  Acute onset of severe left-sided headache, nausea and vomiting. EXAM: CT HEAD WITHOUT CONTRAST TECHNIQUE: Contiguous axial images were obtained from the base of the skull through the vertex without intravenous contrast. COMPARISON:  CT of the head performed 09/24/2014, and MRI of the brain performed 04/13/2016 FINDINGS: Brain: No evidence of acute infarction, hemorrhage, hydrocephalus, extra-axial collection or mass lesion/mass effect. Scattered periventricular and subcortical white matter change likely reflects small vessel ischemic microangiopathy, similar in appearance to 2016. The posterior fossa, including the cerebellum, brainstem and fourth ventricle, is within normal limits. The third and lateral ventricles, and basal ganglia are unremarkable in appearance. No mass effect or midline shift is seen. Vascular: No hyperdense vessel or unexpected calcification. Mildly increased attenuation within the vasculature is thought to be artifactual  in nature. Skull: There is no evidence of fracture; visualized osseous structures are unremarkable in appearance. Sinuses/Orbits: The visualized portions of the orbits are within normal limits. The paranasal sinuses and mastoid air cells are well-aerated. Other: No significant soft tissue abnormalities are seen. IMPRESSION:  1. No acute intracranial pathology seen on CT. 2. Scattered small vessel ischemic microangiopathy. Electronically Signed   By: Garald Balding M.D.   On: 08/15/2017 21:42   US Soft Tissue Head & Neck (non-thyroid)  Result Date: 08/15/2017 CLINICAL DATA:  Dysphagia, history of tracheostomy removal EXAM: ULTRASOUND OF HEAD/NECK SOFT TISSUES TECHNIQUE: Ultrasound examination of the head and neck soft tissues was performed in the area of clinical concern. COMPARISON:  CT 02/06/2017 FINDINGS: Targeted ultrasound of the neck in the region of tracheostomy scar is performed. No cyst or solid mass is visualized. IMPRESSION: No cyst/fluid collection or solid mass to correspond to palpable area near the tracheostomy scar. Electronically Signed   By: Donavan Foil M.D.   On: 08/15/2017 23:07   Ct Abdomen Pelvis W Contrast  Result Date: 08/15/2017 CLINICAL DATA:  Abdominal pain and vomiting and diarrhea. EXAM: CT ABDOMEN AND PELVIS WITH CONTRAST TECHNIQUE: Multidetector CT imaging of the abdomen and pelvis was performed using the standard protocol following bolus administration of intravenous contrast. CONTRAST:  25m ISOVUE-300 IOPAMIDOL (ISOVUE-300) INJECTION 61% COMPARISON:  CT scan dated 04/11/2013 FINDINGS: Lower chest: Normal. Hepatobiliary: Hepatic steatosis. No focal lesions. Biliary tree is normal. Pancreas: Unremarkable. No pancreatic ductal dilatation or surrounding inflammatory changes. Spleen: Normal in size without focal abnormality. Adrenals/Urinary Tract: Adrenal glands are normal. 14 mm cyst in the lower pole of the left kidney, slightly increased in size since 2014. 4 cm slightly  complex cyst in the lower pole of the right kidney also slightly increased in size, but otherwise unchanged. No hydronephrosis. Bladder is empty. Stomach/Bowel: Stomach is within normal limits. Appendix appears normal. No evidence of bowel wall thickening, distention, or inflammatory changes. Vascular/Lymphatic: No significant vascular findings are present. No enlarged abdominal or pelvic lymph nodes. Reproductive: Uterus and bilateral adnexa are unremarkable. Other: No abdominal wall hernia or abnormality. No abdominopelvic ascites. Musculoskeletal: No acute abnormality. 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 IMPRESSION: 1. No acute abnormalities. 2. Chronic appendix steatosis. No significant change in cystic lesions in both kidneys. Electronically Signed   By: JLorriane ShireM.D.   On: 08/15/2017 17:41    Procedures Procedures (including critical care time)  Medications Ordered in ED Medications  cefTRIAXone (ROCEPHIN) 1 g in dextrose 5 % 50 mL IVPB (not administered)  dextrose 5 %-0.45 % sodium chloride infusion ( Intravenous New Bag/Given 08/16/17 0054)  acetaminophen (TYLENOL) tablet 650 mg (not administered)    Or  acetaminophen (TYLENOL) suppository 650 mg (not administered)  docusate sodium (COLACE) capsule 100 mg (100 mg Oral Not Given 08/16/17 0049)  ondansetron (ZOFRAN) tablet 4 mg (not administered)    Or  ondansetron (ZOFRAN) injection 4 mg (not administered)  insulin aspart (novoLOG) injection 0-9 Units (1 Units Subcutaneous Given 08/16/17 0112)  insulin aspart (novoLOG) injection 0-5 Units (0 Units Subcutaneous Not Given 08/16/17 0109)  cefTRIAXone (ROCEPHIN) 1 g in dextrose 5 % 50 mL IVPB (not administered)  albuterol (PROVENTIL) (2.5 MG/3ML) 0.083% nebulizer solution 2.5 mg (not administered)  amLODipine (NORVASC) tablet 10 mg (not administered)  atenolol (TENORMIN) tablet 50 mg (not administered)  busPIRone (BUSPAR) tablet 15 mg (15 mg Oral Given  08/16/17 0046)  clonazePAM (KLONOPIN) tablet 1 mg (1 mg Oral Given 08/16/17 0046)  DULoxetine (CYMBALTA) DR capsule 30 mg (not administered)  gabapentin (NEURONTIN) capsule 900 mg (900 mg Oral Given 08/16/17 0045)  hydrOXYzine (ATARAX/VISTARIL) tablet 50 mg (50 mg Oral Given 08/16/17 0045)  hyoscyamine (LEVSIN SL) SL tablet 0.125 mg (  not administered)  pantoprazole (PROTONIX) EC tablet 40 mg (not administered)  rosuvastatin (CRESTOR) tablet 10 mg (not administered)  ondansetron (ZOFRAN-ODT) disintegrating tablet 4 mg (4 mg Oral Given 08/15/17 1605)  iopamidol (ISOVUE-300) 61 % injection (75 mLs  Contrast Given 08/15/17 1710)  sodium chloride 0.9 % bolus 1,000 mL (1,000 mLs Intravenous Transfusing/Transfer 08/15/17 2340)  HYDROmorphone (DILAUDID) injection 1 mg (1 mg Intravenous Given 08/15/17 2042)  promethazine (PHENERGAN) injection 25 mg (25 mg Intravenous Given 08/15/17 2038)  sodium chloride 0.9 % bolus 1,000 mL (1,000 mLs Intravenous New Bag/Given 08/16/17 0055)     Initial Impression / Assessment and Plan / ED Course  I have reviewed the triage vital signs and the nursing notes.  Pertinent labs & imaging results that were available during my care of the patient were reviewed by me and considered in my medical decision making (see chart for details).     57 year old female with a h/o of DM Type II, CKD, HLD, HTN, CVA, and OSA the emergency department with worsening generalized weakness, dysphagia, nausea, NBNB emesis, diarrhea, generalized abdominal tenderness, headache, blurred vision, decreased hearing, and suicidal ideation over the past 2-3 weeks with a 14 pound weight loss over the last 2 months.  Cr 1.54, patient's baseline appears to be ~1.0-1.1.  Urinalysis is nitrite positive consistent with UTI. Urine culture sent.  IV ceftriaxone given in the ED.  She was also given 2 fluid boluses for AKI and Phenergan for nausea.  EKG with QTC of 552, will withhold additional anti-emetics at this time. CT  abdomen pelvis is unremarkable for acute pathology.  CT head is negative for acute pathology.  Ultrasound of the soft tissues of the head and neck is unremarkable.  Chest x-ray with mild right basilar atelectasis.  Discussed the patient with Dr. Jeanell Sparrow, attending physician.  Dr. Jeanell Sparrow spoke with Tye Savoy of [facility] gastroenterology they will try to schedule the patient for an EGD tomorrow if she is admitted and will continue to follow the patient.  Spoke with Dr. Reesa Chew, hospitalist, who will admit the patient for AKI and continued workup and evaluation. The patient appears reasonably stabilized for admission considering the current resources, flow, and capabilities available in the ED at this time, and I doubt any other [facility] requiring further screening and/or treatment in the ED prior to admission.   Final Clinical Impressions(s) / ED Diagnoses   Final diagnoses:  Dysphagia  AKI (acute kidney injury) ([place])  Acute cystitis without hematuria    ED Discharge Orders    None       Joanne Gavel, PA-C 08/16/17 0136    Pattricia Boss, MD 08/17/17 1308

## 2017-08-15 NOTE — ED Notes (Signed)
Patient transported to Ultrasound 

## 2017-08-15 NOTE — Telephone Encounter (Signed)
Spoke to patient's daughter, she states her mother has not been eating for a couple weeks, can barely keep water down, some vomiting, weak and hallucinating. Please advise.

## 2017-08-15 NOTE — ED Notes (Signed)
Patient transported to CT 

## 2017-08-15 NOTE — ED Triage Notes (Signed)
Per Pt, Pt is coming from home with complaints of abdominal pain with vomiting and diarrhea that got significantly worse two weeks ago.

## 2017-08-15 NOTE — Telephone Encounter (Signed)
  Overall, if she is feeling that poorly, can't tolerate any PO she should go to the ER if she needs fluids and more urgent evaluation. If she is able to tolerate some PO and urinating okay maintaining hydration, we can do CBC and CMET to get some basic labs and give her zofran 8mg  q 8 hrs PRN #30, to see if this will help, as I don't think she is taking any antiemetics.   Her EGD is being done at the hospital in 2 weeks. I don't have any elective spots open before that at the hospital. To have a procedure sooner she would need to be admitted to the hospital. If she is feeling that poorly, as above, she should go to the ED. Can you let me know? Thanks

## 2017-08-15 NOTE — Telephone Encounter (Signed)
Spoke to patient's daughter, she does have zofran but is unable to keep it down. Daughter is very concerned and really unsure if she can even get her mom in the car. She understands to call EMS, if she feels unsafe in transporting her mom. [facility]

## 2017-08-15 NOTE — H&P (Addendum)
History and Physical    Yesenia [name] IDU:[id number] DOB: [birth year] DOA: 08/15/2017  PCP: Everardo Beals, NP Patient coming from: Home  Chief Complaint: Dysphagia  HPI: Yesenia [name] is a 57 y.o. female with medical history significant of CKD stage III, diabetes mellitus type 2, hypertension, hyperlipidemia, OSA, GERD came to the hospital with complains of dysphagia.  Patient states she has had some dysphagia to solid food for several weeks but over the course of past 2 weeks she has noticed this has progressed to both solid and liquids.  She has not been able to keep any food down and immediately has nonbloody vomitus.  During this time she is also had some chills but no fevers at home.  Denies having any previous history of dysphagia, never had an endoscopy in the past.  Due to poor p.o. intake she has had about 15 pounds of weight loss in about 2 months.  She had a colonoscopy back in 2013 which showed some adenoma and is due for colonoscopy soon.  She has been following with Dr. Havery Moros, and is scheduled for outpatient endoscopy on August 29, 2016 but due to progression of the symptoms patient was advised to come to the ER so it can be performed sooner. During past week she is also reported of some dizziness especially with change in position and at times has to use some sort of support to get around. Upon further questioning patient states she has had some suicidal ideation and visual hallucination for past several weeks.  She has been seeing people who are not present for the past year and this has somewhat progressed.  This is she was brought up to the patient's PCP and currently is in process of finding outpatient psychiatrist.  She also had a suicide attempt by taking multiple pills of gabapentin about 2 weeks ago.  Patient also states most of her confusion has been present for past year and feels like every morning when she wakes up she always feels disoriented in terms of morning or  night.  It takes her a few minutes to get properly oriented but this has been worsening over past 2 weeks.  In the ER patient had CT of the abdomen pelvis and CT of the head which was negative for any acute abnormality.  Chest x-ray showed right minimal atelectasis.  CMP showed elevated creatinine at 1.548 from baseline of 1.0.  UA suggested urinary tract infection.  Patient was given IV fluids and started on Rocephin.  It was determined to admit the patient for further workup of dysphagia, acute kidney injury and urinary tract infection.   Review of Systems: As per HPI otherwise 10 point review of systems negative.   Past Medical History:  Diagnosis Date  . Anemia   . Anemia   . Chronic pain   . CKD (chronic kidney disease)   . Diabetes mellitus without complication (HCC)    diet controlled. does not check CBG's  . Fibromyalgia    hospitilized 12/16 due to inability to walk  . Hyperlipemia   . Hypertension   . Insomnia   . Neuropathy   . Right sided weakness   . Stroke [facility]) 2014?   no residual weakness   . Vitamin deficiency    Vit D    Past Surgical History:  Procedure Laterality Date  . [place]  . LEG SURGERY Right 2007   Surgery x3 2 arthroscopies and one rod  . OOPHORECTOMY Right 1993?  Marland Kitchen  TRACHEOSTOMY TUBE PLACEMENT N/A 02/13/2016   Procedure: TRACHEOSTOMY;  Surgeon: Melida Quitter, MD;  Location: [place];  Service: ENT;  Laterality: N/A;     reports that  has never smoked. she has never used smokeless tobacco. She reports that she does not drink alcohol or use drugs.  Allergies  Allergen Reactions  . Buprenorphine Hcl Hives  . Morphine And Related Hives and Dermatitis    Family History  Problem Relation Age of Onset  . Breast cancer Mother   . Leukemia Brother   . Breast cancer Maternal Grandmother      Prior to Admission medications   Medication Sig Start Date End Date Taking? Authorizing Provider  albuterol (PROVENTIL HFA;VENTOLIN HFA)  108 (90 BASE) MCG/ACT inhaler Inhale 2 puffs into the lungs every 6 (six) hours as needed for wheezing or shortness of breath.    Yes [provider]  amLODipine (NORVASC) 10 MG tablet Take 10 mg by mouth daily. 09/07/16  Yes [provider]  atenolol (TENORMIN) 50 MG tablet Take 50 mg by mouth daily. 02/16/16  Yes [provider]  busPIRone (BUSPAR) 15 MG tablet Take 15 mg by mouth 2 (two) times daily. 08/01/17  Yes [provider]  clonazePAM (KLONOPIN) 1 MG tablet Take 1 tablet (1 mg total) by mouth at bedtime. 06/28/16  Yes Lauree Chandler, NP  DULoxetine (CYMBALTA) 30 MG capsule Take 30 mg by mouth daily.   Yes [provider]  gabapentin (NEURONTIN) 300 MG capsule Take 900 mg by mouth at bedtime. 08/01/17  Yes [provider]  hydrOXYzine (ATARAX/VISTARIL) 50 MG tablet Take 50 mg by mouth at bedtime. 02/09/17  Yes [provider]  hyoscyamine (LEVSIN SL) 0.125 MG SL tablet TAKE 0.125 mg TABLET BY MOUTH FOUR TIMES DAILY FOR IBS 04/15/17  Yes [provider]  ibuprofen (ADVIL,MOTRIN) 800 MG tablet Take 800 mg by mouth every 8 (eight) hours as needed for headache or moderate pain.   Yes [provider]  metFORMIN (GLUCOPHAGE) 500 MG tablet Take 500 mg by mouth daily. 07/19/17  Yes [provider]  omeprazole (PRILOSEC) 40 MG capsule Take 1 capsule (40 mg total) by mouth 2 (two) times daily before a meal. 05/12/17  Yes Armbruster, Carlota Raspberry, MD  ondansetron (ZOFRAN-ODT) 4 MG disintegrating tablet Take 4 mg by mouth every 8 (eight) hours as needed for nausea or vomiting.  02/21/17  Yes [provider]  Polyethylene Glycol 3350 (PEG 3350) POWD Take 17 g by mouth daily as needed. 11/02/16  Yes [provider]  rosuvastatin (CRESTOR) 10 MG tablet Take 10 mg by mouth daily.   Yes [provider]  Vitamin D, Ergocalciferol, (DRISDOL) 50000 units CAPS capsule Take 50,000 Units by mouth every Monday.  02/18/17  Yes [provider]  Na Sulfate-K Sulfate-Mg Sulf 17.5-3.13-1.6 GM/177ML SOLN Take 1 kit as directed by mouth. 05/23/17   Armbruster, Carlota Raspberry, MD  traMADol (ULTRAM) 50 MG tablet Take 1 tablet (50 mg total) by mouth every 6 (six) hours as needed for moderate pain or severe pain. Patient not taking: Reported on 06/27/2017 06/28/16   Lauree Chandler, NP    Physical Exam: Vitals:   08/15/17 2051 08/15/17 2145 08/15/17 2215 08/15/17 2230  BP: 100/85   116/60  Pulse: 90 73 71 67  Resp: (!) 22 (!) '24 20 20  ' Temp:      TempSrc:      SpO2: 98% 98% 96% 97%  Weight:  Height:          Constitutional: NAD, calm, comfortable Vitals:   08/15/17 2051 08/15/17 2145 08/15/17 2215 08/15/17 2230  BP: 100/85   116/60  Pulse: 90 73 71 67  Resp: (!) 22 (!) '24 20 20  ' Temp:      TempSrc:      SpO2: 98% 98% 96% 97%  Weight:      Height:       Eyes: PERRL, lids and conjunctivae normal ENMT: Mucous membranes are dry posterior pharynx clear of any exudate or lesions.Normal dentition.  Tracheostomy scar noted Neck: normal, supple, no masses, no thyromegaly Respiratory: clear to auscultation bilaterally, no wheezing, no crackles. Normal respiratory effort. No accessory muscle use.  Cardiovascular: Regular rate and rhythm, no murmurs / rubs / gallops. No extremity edema. 2+ pedal pulses. No carotid bruits.  Abdomen: no tenderness, no masses palpated. No hepatosplenomegaly. Bowel sounds positive.  Musculoskeletal: no clubbing / cyanosis. No joint deformity upper and lower extremities. Good ROM, no contractures. Normal muscle tone.  Skin: no rashes, lesions, ulcers. No induration Neurologic: CN 2-12 grossly intact. Sensation intact, DTR normal. Strength 5/5 in all 4.  Psychiatric: Normal judgment and insight. Alert and oriented x 3. Normal mood.     Labs on Admission: I have personally reviewed following labs and imaging studies  CBC: Recent Labs  Lab 08/15/17 1600  WBC  8.0  NEUTROABS 3.9  HGB 12.2  HCT 37.3  MCV 82.0  PLT 409   Basic Metabolic Panel: Recent Labs  Lab 08/15/17 1600  NA 142  K 3.4*  CL 107  CO2 21*  GLUCOSE 140*  BUN 13  CREATININE 1.54*  CALCIUM 9.0   GFR: Estimated Creatinine Clearance: 60.3 mL/min (A) (by C-G formula based on SCr of 1.54 mg/dL (H)). Liver Function Tests: Recent Labs  Lab 08/15/17 1600  AST 21  ALT 21  ALKPHOS 111  BILITOT 0.5  PROT 7.6  ALBUMIN 3.9   Recent Labs  Lab 08/15/17 1600  LIPASE 40   No results for input(s): AMMONIA in the last 168 hours. Coagulation Profile: No results for input(s): INR, PROTIME in the last 168 hours. Cardiac Enzymes: No results for input(s): CKTOTAL, CKMB, CKMBINDEX, TROPONINI in the last 168 hours. BNP (last 3 results) No results for input(s): PROBNP in the last 8760 hours. HbA1C: No results for input(s): HGBA1C in the last 72 hours. CBG: No results for input(s): GLUCAP in the last 168 hours. Lipid Profile: No results for input(s): CHOL, HDL, LDLCALC, TRIG, CHOLHDL, LDLDIRECT in the last 72 hours. Thyroid Function Tests: No results for input(s): TSH, T4TOTAL, FREET4, T3FREE, THYROIDAB in the last 72 hours. Anemia Panel: No results for input(s): VITAMINB12, FOLATE, FERRITIN, TIBC, IRON, RETICCTPCT in the last 72 hours. Urine analysis:    Component Value Date/Time   COLORURINE YELLOW 08/15/2017 1647   APPEARANCEUR CLOUDY (A) 08/15/2017 1647   LABSPEC 1.017 08/15/2017 1647   PHURINE 5.0 08/15/2017 1647   GLUCOSEU NEGATIVE 08/15/2017 1647   HGBUR SMALL (A) 08/15/2017 1647   BILIRUBINUR NEGATIVE 08/15/2017 1647   KETONESUR NEGATIVE 08/15/2017 1647   PROTEINUR 30 (A) 08/15/2017 1647   UROBILINOGEN 0.2 06/16/2014 0955   NITRITE POSITIVE (A) 08/15/2017 1647   LEUKOCYTESUR LARGE (A) 08/15/2017 1647   Sepsis Labs: !!!!!!!!!!!!!!!!!!!!!!!!!!!!!!!!!!!!!!!!!!!! '@LABRCNTIP' (procalcitonin:4,lacticidven:4) )No results found for this or any previous visit (from  the past 240 hour(s)).   Radiological Exams on Admission: Dg Chest 2 View  Result Date: 08/15/2017 CLINICAL DATA:  Subacute onset of  generalized abdominal pain, chest pain and dizziness. EXAM: CHEST  2 VIEW COMPARISON:  Chest radiograph performed 03/19/2017 FINDINGS: The lungs are well-aerated. Minimal right basilar atelectasis is noted. There is no evidence of pleural effusion or pneumothorax. The heart is borderline normal in size. No acute osseous abnormalities are seen. IMPRESSION: Minimal right basilar atelectasis noted.  Lungs otherwise clear. Electronically Signed   By: Garald Balding M.D.   On: 08/15/2017 21:45   Ct Head Wo Contrast  Result Date: 08/15/2017 CLINICAL DATA:  Acute onset of severe left-sided headache, nausea and vomiting. EXAM: CT HEAD WITHOUT CONTRAST TECHNIQUE: Contiguous axial images were obtained from the base of the skull through the vertex without intravenous contrast. COMPARISON:  CT of the head performed 09/24/2014, and MRI of the brain performed 04/13/2016 FINDINGS: Brain: No evidence of acute infarction, hemorrhage, hydrocephalus, extra-axial collection or mass lesion/mass effect. Scattered periventricular and subcortical white matter change likely reflects small vessel ischemic microangiopathy, similar in appearance to 2016. The posterior fossa, including the cerebellum, brainstem and fourth ventricle, is within normal limits. The third and lateral ventricles, and basal ganglia are unremarkable in appearance. No mass effect or midline shift is seen. Vascular: No hyperdense vessel or unexpected calcification. Mildly increased attenuation within the vasculature is thought to be artifactual in nature. Skull: There is no evidence of fracture; visualized osseous structures are unremarkable in appearance. Sinuses/Orbits: The visualized portions of the orbits are within normal limits. The paranasal sinuses and mastoid air cells are well-aerated. Other: No significant soft tissue  abnormalities are seen. IMPRESSION: 1. No acute intracranial pathology seen on CT. 2. Scattered small vessel ischemic microangiopathy. Electronically Signed   By: Garald Balding M.D.   On: 08/15/2017 21:42   Ct Abdomen Pelvis W Contrast  Result Date: 08/15/2017 CLINICAL DATA:  Abdominal pain and vomiting and diarrhea. EXAM: CT ABDOMEN AND PELVIS WITH CONTRAST TECHNIQUE: Multidetector CT imaging of the abdomen and pelvis was performed using the standard protocol following bolus administration of intravenous contrast. CONTRAST:  47m ISOVUE-300 IOPAMIDOL (ISOVUE-300) INJECTION 61% COMPARISON:  CT scan dated 04/11/2013 FINDINGS: Lower chest: Normal. Hepatobiliary: Hepatic steatosis. No focal lesions. Biliary tree is normal. Pancreas: Unremarkable. No pancreatic ductal dilatation or surrounding inflammatory changes. Spleen: Normal in size without focal abnormality. Adrenals/Urinary Tract: Adrenal glands are normal. 14 mm cyst in the lower pole of the left kidney, slightly increased in size since 2014. 4 cm slightly complex cyst in the lower pole of the right kidney also slightly increased in size, but otherwise unchanged. No hydronephrosis. Bladder is empty. Stomach/Bowel: Stomach is within normal limits. Appendix appears normal. No evidence of bowel wall thickening, distention, or inflammatory changes. Vascular/Lymphatic: No significant vascular findings are present. No enlarged abdominal or pelvic lymph nodes. Reproductive: Uterus and bilateral adnexa are unremarkable. Other: No abdominal wall hernia or abnormality. No abdominopelvic ascites. Musculoskeletal: No acute abnormality. 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 IMPRESSION: 1. No acute abnormalities. 2. Chronic appendix steatosis. No significant change in cystic lesions in both kidneys. Electronically Signed   By: JLorriane ShireM.D.   On: 08/15/2017 17:41    EKG: Independently reviewed.   Assessment/Plan Active  Problems:   AK[place] (acute kidney injury) ([place]   Dysphagia- Solid and liquid  Weight loss/Nausea and Vomiting  -Admit the patient to the hospital for further care and monitoring.  Functional versus anatomic obstruction -Patient is going to require an EGD tomorrow morning, we will keep him n.p.o. past midnight.  Gastroenterology has been  consulted -We will give IV fluids, Accu-Chek and sliding scale while n.p.o. -If EGD is negative he will probably need a functional study-esophagram and/or manometry -Antiemetics as needed -CT of the abdomen pelvis done which does not show any acute abdominal abnormalities  Urinary tract infection - Urine culture sent, will start the patient on IV Rocephin.  Pharmacy to dose  Altered mental status -Concern for acute metabolic encephalopathy, dehydration versus early partly early dementia from what the patient and daughter is describing -CT of the head is negative, we will treat this with IV antibiotics and fluids -We will closely monitor her mental status.  Further workup can be done outpatient -Check TSH, B12, folate and ammonia  Dizziness Mild to moderate dehydration -Suspect secondary to poor p.o. intake for past couple of weeks - IV fluids, monitor urine output.  Acute kidney injury on CKD stage II-3  - Baseline creatinine is 1.0, today is 1.54 -Prerenal in nature, will give IV fluids, avoid nephrotoxic drugs  Suicidal ideation -Does not have active thoughts but reports she had an attempt about 2 weeks ago. -We will place her on suicide precaution, one-to-one sitter -Consider inpatient psych eval and/or outpatient referral  Diabetes mellitus type 2 -Accu-Chek and sliding scale.  Metformin on hold  Hypertension -Continue home medications  Obstructive sleep apnea -Used to have tracheostomy for this but now this is out since November 2018, initially placed in Aug 2017.  -Provide supportive care  GERD -Continue PPI     DVT prophylaxis:  SCDs Code Status: Full  Family Communication: Daughter at the bedside  Disposition Plan:  TBD Consults called: GI called in ED.  Admission status: Inpatient admission.    Aizlynn Digilio Arsenio Loader MD [facility] Pager [phone]  If 7PM-7AM, please contact night-coverage [email or web address] Password [email or web address]  08/15/2017, 11:09 PM

## 2017-08-15 NOTE — Telephone Encounter (Signed)
Left message for patients daughter to return call.

## 2017-08-15 NOTE — Telephone Encounter (Signed)
Patient daughter states she is really worried about the pt due to her not eating and some other symptoms. Pt daughter requesting cb to discuss possibly getting [facility]

## 2017-08-16 ENCOUNTER — Encounter (HOSPITAL_COMMUNITY): Payer: Self-pay | Admitting: *Deleted

## 2017-08-16 ENCOUNTER — Inpatient Hospital Stay (HOSPITAL_COMMUNITY): Payer: Medicaid Other | Admitting: Anesthesiology

## 2017-08-16 ENCOUNTER — Encounter (HOSPITAL_COMMUNITY)
Admission: EM | Disposition: A | Discharge status: Other Acute Inpatient Hospital | Payer: Self-pay | Source: Home / Self Care | Attending: Internal Medicine

## 2017-08-16 DIAGNOSIS — G4700 Insomnia, unspecified: Secondary | ICD-10-CM

## 2017-08-16 DIAGNOSIS — R109 Unspecified abdominal pain: Secondary | ICD-10-CM

## 2017-08-16 DIAGNOSIS — F332 Major depressive disorder, recurrent severe without psychotic features: Secondary | ICD-10-CM

## 2017-08-16 DIAGNOSIS — R450 Nervousness: Secondary | ICD-10-CM

## 2017-08-16 DIAGNOSIS — Z736 Limitation of activities due to disability: Secondary | ICD-10-CM

## 2017-08-16 DIAGNOSIS — F419 Anxiety disorder, unspecified: Secondary | ICD-10-CM

## 2017-08-16 DIAGNOSIS — K2970 Gastritis, unspecified, without bleeding: Secondary | ICD-10-CM

## 2017-08-16 DIAGNOSIS — Z915 Personal history of self-harm: Secondary | ICD-10-CM

## 2017-08-16 DIAGNOSIS — F1021 Alcohol dependence, in remission: Secondary | ICD-10-CM

## 2017-08-16 DIAGNOSIS — D649 Anemia, unspecified: Secondary | ICD-10-CM

## 2017-08-16 DIAGNOSIS — M549 Dorsalgia, unspecified: Secondary | ICD-10-CM

## 2017-08-16 DIAGNOSIS — R45851 Suicidal ideations: Secondary | ICD-10-CM

## 2017-08-16 DIAGNOSIS — E119 Type 2 diabetes mellitus without complications: Secondary | ICD-10-CM

## 2017-08-16 DIAGNOSIS — R1013 Epigastric pain: Secondary | ICD-10-CM

## 2017-08-16 DIAGNOSIS — R1310 Dysphagia, unspecified: Secondary | ICD-10-CM

## 2017-08-16 DIAGNOSIS — R112 Nausea with vomiting, unspecified: Secondary | ICD-10-CM

## 2017-08-16 HISTORY — PX: ESOPHAGOGASTRODUODENOSCOPY (EGD) WITH PROPOFOL: SHX5813

## 2017-08-16 LAB — AMMONIA: AMMONIA: 26 umol/L (ref 9–35)

## 2017-08-16 LAB — VITAMIN B12: Vitamin B-12: 474 pg/mL (ref 180–914)

## 2017-08-16 LAB — PROTIME-INR
INR: 1.2
Prothrombin Time: 15.1 seconds (ref 11.4–15.2)

## 2017-08-16 LAB — CBC
HEMATOCRIT: 35.1 % — AB (ref 36.0–46.0)
Hemoglobin: 11.2 g/dL — ABNORMAL LOW (ref 12.0–15.0)
MCH: 26.7 pg (ref 26.0–34.0)
MCHC: 31.9 g/dL (ref 30.0–36.0)
MCV: 83.6 fL (ref 78.0–100.0)
PLATELETS: 329 10*3/uL (ref 150–400)
RBC: 4.2 MIL/uL (ref 3.87–5.11)
RDW: 14.3 % (ref 11.5–15.5)
WBC: 10.2 10*3/uL (ref 4.0–10.5)

## 2017-08-16 LAB — COMPREHENSIVE METABOLIC PANEL
ALT: 19 U/L (ref 14–54)
AST: 17 U/L (ref 15–41)
Albumin: 3.5 g/dL (ref 3.5–5.0)
Alkaline Phosphatase: 100 U/L (ref 38–126)
Anion gap: 14 (ref 5–15)
BUN: 17 mg/dL (ref 6–20)
CHLORIDE: 105 mmol/L (ref 101–111)
CO2: 23 mmol/L (ref 22–32)
Calcium: 8.7 mg/dL — ABNORMAL LOW (ref 8.9–10.3)
Creatinine, Ser: 2.03 mg/dL — ABNORMAL HIGH (ref 0.44–1.00)
GFR, EST AFRICAN AMERICAN: 30 mL/min — AB (ref >60)
GFR, EST NON AFRICAN AMERICAN: 26 mL/min — AB (ref >60)
Glucose, Bld: 131 mg/dL — ABNORMAL HIGH (ref 65–99)
POTASSIUM: 3.1 mmol/L — AB (ref 3.5–5.1)
Sodium: 142 mmol/L (ref 135–145)
Total Bilirubin: 0.6 mg/dL (ref 0.3–1.2)
Total Protein: 6.8 g/dL (ref 6.5–8.1)

## 2017-08-16 LAB — TSH: TSH: 1.193 u[IU]/mL (ref 0.350–4.500)

## 2017-08-16 LAB — GLUCOSE, CAPILLARY
GLUCOSE-CAPILLARY: 126 mg/dL — AB (ref 65–99)
GLUCOSE-CAPILLARY: 162 mg/dL — AB (ref 65–99)
Glucose-Capillary: 123 mg/dL — ABNORMAL HIGH (ref 65–99)
Glucose-Capillary: 144 mg/dL — ABNORMAL HIGH (ref 65–99)
Glucose-Capillary: 194 mg/dL — ABNORMAL HIGH (ref 65–99)
Glucose-Capillary: 108 mg/dL — ABNORMAL HIGH (ref 65–99)

## 2017-08-16 LAB — HIV ANTIBODY (ROUTINE TESTING W REFLEX): HIV SCREEN 4TH GENERATION: NONREACTIVE

## 2017-08-16 LAB — APTT: APTT: 34 s (ref 24–36)

## 2017-08-16 SURGERY — ESOPHAGOGASTRODUODENOSCOPY (EGD) WITH PROPOFOL
Anesthesia: Monitor Anesthesia Care

## 2017-08-16 SURGERY — EGD (ESOPHAGOGASTRODUODENOSCOPY)
Anesthesia: Monitor Anesthesia Care

## 2017-08-16 MED ORDER — LACTATED RINGERS IV SOLN
INTRAVENOUS | Status: DC | PRN
  Administered 2017-08-16: 11:00:00 via INTRAVENOUS

## 2017-08-16 MED ORDER — INFLUENZA VAC SPLIT QUAD 0.5 ML IM SUSY
0.5000 mL | PREFILLED_SYRINGE | INTRAMUSCULAR | Status: AC
  Administered 2017-08-17: 0.5 mL via INTRAMUSCULAR
  Filled 2017-08-16: qty 0.5

## 2017-08-16 MED ORDER — LIDOCAINE 2% (20 MG/ML) 5 ML SYRINGE
INTRAMUSCULAR | Status: DC | PRN
  Administered 2017-08-16: 40 mg via INTRAVENOUS
  Administered 2017-08-16: 60 mg via INTRAVENOUS

## 2017-08-16 MED ORDER — PROPOFOL 500 MG/50ML IV EMUL
INTRAVENOUS | Status: DC | PRN
  Administered 2017-08-16: 300 ug/kg/min via INTRAVENOUS

## 2017-08-16 MED ORDER — PANTOPRAZOLE SODIUM 40 MG IV SOLR
40.0000 mg | Freq: Two times a day (BID) | INTRAVENOUS | Status: DC
  Administered 2017-08-16 – 2017-08-18 (×6): 40 mg via INTRAVENOUS
  Filled 2017-08-16 (×6): qty 40

## 2017-08-16 MED ORDER — PHENYLEPHRINE 40 MCG/ML (10ML) SYRINGE FOR IV PUSH (FOR BLOOD PRESSURE SUPPORT)
PREFILLED_SYRINGE | INTRAVENOUS | Status: DC | PRN
  Administered 2017-08-16: 80 ug via INTRAVENOUS

## 2017-08-16 MED ORDER — PROMETHAZINE HCL 25 MG/ML IJ SOLN
12.5000 mg | Freq: Four times a day (QID) | INTRAMUSCULAR | Status: DC
  Administered 2017-08-16 – 2017-08-17 (×4): 12.5 mg via INTRAVENOUS
  Filled 2017-08-16 (×4): qty 1

## 2017-08-16 MED ORDER — SODIUM CHLORIDE 0.9 % IV SOLN
INTRAVENOUS | Status: DC
  Administered 2017-08-17 – 2017-08-20 (×6): via INTRAVENOUS

## 2017-08-16 MED ORDER — LACTATED RINGERS IV SOLN
INTRAVENOUS | Status: DC
  Administered 2017-08-16: 11:00:00 via INTRAVENOUS

## 2017-08-16 SURGICAL SUPPLY — 14 items

## 2017-08-16 NOTE — Transfer of Care (Signed)
Immediate Anesthesia Transfer of Care Note  Patient: Yesenia [name]  Procedure(s) Performed: ESOPHAGOGASTRODUODENOSCOPY (EGD) WITH PROPOFOL (N/A )  Patient Location: Endoscopy Unit  Anesthesia Type:MAC  Level of Consciousness: awake  Airway & Oxygen Therapy: Patient Spontanous Breathing and Patient connected to nasal cannula oxygen  Post-op Assessment: Report given to RN and Post -op Vital signs reviewed and stable  Post vital signs: Reviewed and stable  Last Vitals:  Vitals:   08/16/17 0636 08/16/17 1050  BP: 101/63 (!) 105/53  Pulse: 71 70  Resp: 17 13  Temp: 36.8 C 36.7 C  SpO2: 100% 95%    Last Pain:  Vitals:   08/16/17 1050  TempSrc: Oral  PainSc:       Patients Stated Pain Goal: 3 (41/96/22 [id number])  Complications: No apparent anesthesia complications

## 2017-08-16 NOTE — Anesthesia Procedure Notes (Signed)
Procedure Name: MAC Date/Time: 08/16/2017 12:17 PM Performed by: Lieutenant Diego, CRNA Pre-anesthesia Checklist: Patient identified, Emergency Drugs available, Suction available, Patient being monitored and Timeout performed Patient Re-evaluated:Patient Re-evaluated prior to induction Oxygen Delivery Method: Nasal cannula Induction Type: IV induction

## 2017-08-16 NOTE — Anesthesia Preprocedure Evaluation (Addendum)
Anesthesia Evaluation  Patient identified by MRN, date of birth, ID band Patient awake    Airway Mallampati: II  TM Distance: >3 FB     Dental   Pulmonary sleep apnea , pneumonia,    breath sounds clear to auscultation       Cardiovascular hypertension,  Rhythm:Regular Rate:Normal     Neuro/Psych    GI/Hepatic GERD  ,  Endo/Other  diabetes  Renal/GU Renal disease     Musculoskeletal   Abdominal   Peds  Hematology   Anesthesia Other Findings   Reproductive/Obstetrics                            Anesthesia Physical Anesthesia Plan  ASA: III  Anesthesia Plan: MAC   Post-op Pain Management:    Induction:   PONV Risk Score and Plan: 2 and Treatment may vary due to age or medical condition  Airway Management Planned: Mask and Nasal Cannula  Additional Equipment:   Intra-op Plan:   Post-operative Plan:   Informed Consent: I have reviewed the patients History and Physical, chart, labs and discussed the procedure including the risks, benefits and alternatives for the proposed anesthesia with the patient or authorized representative who has indicated his/her understanding and acceptance.     Plan Discussed with: CRNA and Anesthesiologist  Anesthesia Plan Comments:        Anesthesia Quick Evaluation

## 2017-08-16 NOTE — Progress Notes (Signed)
Pt was advised that all of her belongings will be place in a bag and stored away. Advised pt that we are not responsible for personal belongings and to have family pick up her wallet if possible. Pt agreed to having belongings stored away. Pt was advised that she gets to keep her cell phone but not the charger and to call when she needs her phone charged.

## 2017-08-16 NOTE — Anesthesia Postprocedure Evaluation (Signed)
Anesthesia Post Note  Patient: Yesenia [name]  Procedure(s) Performed: ESOPHAGOGASTRODUODENOSCOPY (EGD) WITH PROPOFOL (N/A )     Patient location during evaluation: PACU Anesthesia Type: MAC Level of consciousness: awake Pain management: pain level controlled Vital Signs Assessment: post-procedure vital signs reviewed and stable Respiratory status: spontaneous breathing Cardiovascular status: stable Anesthetic complications: no    Last Vitals:  Vitals:   08/16/17 1230 08/16/17 1240  BP: 110/61 108/68  Pulse:  65  Resp: 18 15  Temp: 36.5 C   SpO2: 97% 100%    Last Pain:  Vitals:   08/16/17 1230  TempSrc: Oral  PainSc:                  Yesenia [name]

## 2017-08-16 NOTE — Progress Notes (Signed)
[facility]                                                                              Patient Demographics  Yesenia [name], is a 57 y.o. female, DOB - 09-23-1960, WUJ:[id number]  Admit date - 08/15/2017   Admitting Physician Ankit Joline Maxcy, MD  Outpatient Primary MD for the patient is Marva Panda, NP  Outpatient specialists:   LOS - 1  days   Medical records reviewed and are as summarized below:    Chief Complaint  Patient presents with  . Abdominal Pain       Brief summary   Patient is a 57 year old female with CKD stage III, diabetes mellitus type 2, hypertension, hyperlipidemia, GERD presented with complaints of dysphagia.  Patient reported dysphagia to solid food for several weeks but over the course of the past 2 weeks this had progressed to both solids and liquids.  She had not been able to keep any food down.  Patient reported no prior history of dysphagia and no history of endoscopy in the past.  Patient was admitted for further workup.    Assessment & Plan    Principal Problem:   Dysphagia -GI consulted, underwent EGD, no stricture or mass, recommended possible gastroparesis -Will attempt GE study, if positive, will benefit from Reglan - CT of the abdomen and pelvis showed no acute intra-abdominal abnormalities  Active Problems:  UTI -continue IV Rocephin, follow sensitivities  Dizziness, dehydration -Continue IV fluids  Acute kidney injury on CKD stage II-III -Baseline creatinine 1.0, currently 2.0 -Continue IV fluid hydration  Diabetes mellitus -Metformin on hold, continue sliding scale insulin  Suicidal ideation -Continue sitter, placed inpatient psych evaluation  Obstructive sleep apnea -Continue supportive care, patient used to have tracheostomy for this, now out since November 2018  Code Status: Full CODE STATUS DVT Prophylaxis: SCD Family Communication: Discussed in detail with the patient, all imaging results,  lab results explained to the patient    Disposition Plan:  Time Spent in minutes 25 minutes  Procedures:  EGD  Consultants:   GI  Antimicrobials:      Medications  Scheduled Meds: . [MAR Hold] amLODipine  10 mg Oral Daily  . [MAR Hold] atenolol  50 mg Oral Daily  . [MAR Hold] busPIRone  15 mg Oral BID  . [MAR Hold] clonazePAM  1 mg Oral QHS  . [MAR Hold] docusate sodium  100 mg Oral BID  . [MAR Hold] DULoxetine  30 mg Oral Daily  . [MAR Hold] gabapentin  900 mg Oral QHS  . [MAR Hold] hydrOXYzine  50 mg Oral QHS  . [MAR Hold] insulin aspart  0-5 Units Subcutaneous QHS  . [MAR Hold] insulin aspart  0-9 Units Subcutaneous Q6H  . pantoprazole (PROTONIX) IV  40 mg Intravenous Q12H  . promethazine  12.5 mg Intravenous Q6H  . [MAR Hold] rosuvastatin  10 mg Oral Daily   Continuous Infusions: . [MAR Hold] cefTRIAXone (ROCEPHIN)  IV    . dextrose 5 % and 0.45% NaCl 75 mL/hr at 08/16/17 0054  . lactated ringers Stopped (08/16/17 1237)   PRN Meds:.[MAR Hold] acetaminophen **OR** [MAR Hold]  acetaminophen, [MAR Hold] albuterol, [MAR Hold] hyoscyamine   Antibiotics   Anti-infectives (From admission, onward)   Start     Dose/Rate Route Frequency Ordered Stop   08/16/17 2200  [MAR Hold]  cefTRIAXone (ROCEPHIN) 1 g in dextrose 5 % 50 mL IVPB     (MAR Hold since 08/16/17 1045)   1 g 100 mL/hr over 30 Minutes Intravenous Every 24 hours 08/15/17 2240     08/15/17 2230  cefTRIAXone (ROCEPHIN) 1 g in dextrose 5 % 50 mL IVPB     1 g 100 mL/hr over 30 Minutes Intravenous  Once 08/15/17 2215 08/16/17 0259        Subjective:   Brantley StageSonja Lubrano was seen and examined today.  No significant complaints this morning.  No fevers or chills.  Awaiting EGD.  Sitter at the bedside.  Patient denies dizziness, chest pain, shortness of breath, abdominal pain, N/V/D/C, new weakness, numbess, tingling.  No fevers.  Objective:   Vitals:   08/15/17 2300 08/16/17 0636 08/16/17 1050 08/16/17 1230    BP: 99/62 101/63 (!) 105/53 110/61  Pulse:  71 70   Resp: 18 17 13 18   Temp: 98 F (36.7 C) 98.2 F (36.8 C) 98.1 F (36.7 C) 97.7 F (36.5 C)  TempSrc: Oral Oral Oral Oral  SpO2: 100% 100% 95% 97%  Weight:   (!) 141.5 kg (312 lb)   Height:   5\' 7"  (1.702 m)     Intake/Output Summary (Last 24 hours) at 08/16/2017 1237 Last data filed at 08/16/2017 1216 Gross per 24 hour  Intake 582.5 ml  Output 250 ml  Net 332.5 ml     Wt Readings from Last 3 Encounters:  08/16/17 (!) 141.5 kg (312 lb)  05/12/17 (!) 145.7 kg (321 lb 4 oz)  04/27/17 (!) 146.5 kg (323 lb)     Exam  General: Alert and oriented x 3, NAD  Eyes:   HEENT:  Atraumatic, normocephalic, normal oropharynx  Cardiovascular: S1 S2 auscultated,  Regular rate and rhythm.  Respiratory: Clear to auscultation bilaterally, no wheezing, rales or rhonchi  Gastrointestinal: Soft, nontender, nondistended, + bowel sounds  Ext: no pedal edema bilaterally  Neuro: no new deficits  Musculoskeletal: No digital cyanosis, clubbing  Skin: No rashes  Psych: Normal affect and demeanor, alert and oriented x3    Data Reviewed:  I have personally reviewed following labs and imaging studies  Micro Results No results found for this or any previous visit (from the past 240 hour(s)).  Radiology Reports Dg Chest 2 View  Result Date: 08/15/2017 CLINICAL DATA:  Subacute onset of generalized abdominal pain, chest pain and dizziness. EXAM: CHEST  2 VIEW COMPARISON:  Chest radiograph performed 03/19/2017 FINDINGS: The lungs are well-aerated. Minimal right basilar atelectasis is noted. There is no evidence of pleural effusion or pneumothorax. The heart is borderline normal in size. No acute osseous abnormalities are seen. IMPRESSION: Minimal right basilar atelectasis noted.  Lungs otherwise clear. Electronically Signed   By: Roanna RaiderJeffery  Chang M.D.   On: 08/15/2017 21:45   Ct Head Wo Contrast  Result Date: 08/15/2017 CLINICAL DATA:  Acute  onset of severe left-sided headache, nausea and vomiting. EXAM: CT HEAD WITHOUT CONTRAST TECHNIQUE: Contiguous axial images were obtained from the base of the skull through the vertex without intravenous contrast. COMPARISON:  CT of the head performed 09/24/2014, and MRI of the brain performed 04/13/2016 FINDINGS: Brain: No evidence of acute infarction, hemorrhage, hydrocephalus, extra-axial collection or mass lesion/mass effect. Scattered periventricular and subcortical  white matter change likely reflects small vessel ischemic microangiopathy, similar in appearance to 2016. The posterior fossa, including the cerebellum, brainstem and fourth ventricle, is within normal limits. The third and lateral ventricles, and basal ganglia are unremarkable in appearance. No mass effect or midline shift is seen. Vascular: No hyperdense vessel or unexpected calcification. Mildly increased attenuation within the vasculature is thought to be artifactual in nature. Skull: There is no evidence of fracture; visualized osseous structures are unremarkable in appearance. Sinuses/Orbits: The visualized portions of the orbits are within normal limits. The paranasal sinuses and mastoid air cells are well-aerated. Other: No significant soft tissue abnormalities are seen. IMPRESSION: 1. No acute intracranial pathology seen on CT. 2. Scattered small vessel ischemic microangiopathy. Electronically Signed   By: Roanna Raider M.D.   On: 08/15/2017 21:42   US Soft Tissue Head & Neck (non-thyroid)  Result Date: 08/15/2017 CLINICAL DATA:  Dysphagia, history of tracheostomy removal EXAM: ULTRASOUND OF HEAD/NECK SOFT TISSUES TECHNIQUE: Ultrasound examination of the head and neck soft tissues was performed in the area of clinical concern. COMPARISON:  CT 02/06/2017 FINDINGS: Targeted ultrasound of the neck in the region of tracheostomy scar is performed. No cyst or solid mass is visualized. IMPRESSION: No cyst/fluid collection or solid mass to  correspond to palpable area near the tracheostomy scar. Electronically Signed   By: Jasmine Pang M.D.   On: 08/15/2017 23:07   Ct Abdomen Pelvis W Contrast  Result Date: 08/15/2017 CLINICAL DATA:  Abdominal pain and vomiting and diarrhea. EXAM: CT ABDOMEN AND PELVIS WITH CONTRAST TECHNIQUE: Multidetector CT imaging of the abdomen and pelvis was performed using the standard protocol following bolus administration of intravenous contrast. CONTRAST:  75mL ISOVUE-300 IOPAMIDOL (ISOVUE-300) INJECTION 61% COMPARISON:  CT scan dated 04/11/2013 FINDINGS: Lower chest: Normal. Hepatobiliary: Hepatic steatosis. No focal lesions. Biliary tree is normal. Pancreas: Unremarkable. No pancreatic ductal dilatation or surrounding inflammatory changes. Spleen: Normal in size without focal abnormality. Adrenals/Urinary Tract: Adrenal glands are normal. 14 mm cyst in the lower pole of the left kidney, slightly increased in size since 2014. 4 cm slightly complex cyst in the lower pole of the right kidney also slightly increased in size, but otherwise unchanged. No hydronephrosis. Bladder is empty. Stomach/Bowel: Stomach is within normal limits. Appendix appears normal. No evidence of bowel wall thickening, distention, or inflammatory changes. Vascular/Lymphatic: No significant vascular findings are present. No enlarged abdominal or pelvic lymph nodes. Reproductive: Uterus and bilateral adnexa are unremarkable. Other: No abdominal wall hernia or abnormality. No abdominopelvic ascites. Musculoskeletal: No acute abnormality. 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 IMPRESSION: 1. No acute abnormalities. 2. Chronic appendix steatosis. No significant change in cystic lesions in both kidneys. Electronically Signed   By: Francene Boyers M.D.   On: 08/15/2017 17:41    Lab Data:  CBC: Recent Labs  Lab 08/15/17 1600 08/16/17 0350  WBC 8.0 10.2  NEUTROABS 3.9  --   HGB 12.2 11.2*  HCT 37.3 35.1*  MCV  82.0 83.6  PLT 374 329   Basic Metabolic Panel: Recent Labs  Lab 08/15/17 1600 08/16/17 0350  NA 142 142  K 3.4* 3.1*  CL 107 105  CO2 21* 23  GLUCOSE 140* 131*  BUN 13 17  CREATININE 1.54* 2.03*  CALCIUM 9.0 8.7*   GFR: Estimated Creatinine Clearance: 45.7 mL/min (A) (by C-G formula based on SCr of 2.03 mg/dL (H)). Liver Function Tests: Recent Labs  Lab 08/15/17 1600 08/16/17 0350  AST 21 17  ALT 21 19  ALKPHOS 111 100  BILITOT 0.5 0.6  PROT 7.6 6.8  ALBUMIN 3.9 3.5   Recent Labs  Lab 08/15/17 1600  LIPASE 40   Recent Labs  Lab 08/16/17 0011  AMMONIA 26   Coagulation Profile: Recent Labs  Lab 08/16/17 0350  INR 1.20   Cardiac Enzymes: No results for input(s): CKTOTAL, CKMB, CKMBINDEX, TROPONINI in the last 168 hours. BNP (last 3 results) No results for input(s): PROBNP in the last 8760 hours. HbA1C: No results for input(s): HGBA1C in the last 72 hours. CBG: Recent Labs  Lab 08/16/17 0052 08/16/17 0510 08/16/17 0817 08/16/17 1057  GLUCAP 126* 123* 144* 162*   Lipid Profile: No results for input(s): CHOL, HDL, LDLCALC, TRIG, CHOLHDL, LDLDIRECT in the last 72 hours. Thyroid Function Tests: Recent Labs    08/16/17 0011  TSH 1.193   Anemia Panel: Recent Labs    08/16/17 0011  VITAMINB12 474   Urine analysis:    Component Value Date/Time   COLORURINE YELLOW 08/15/2017 1647   APPEARANCEUR CLOUDY (A) 08/15/2017 1647   LABSPEC 1.017 08/15/2017 1647   PHURINE 5.0 08/15/2017 1647   GLUCOSEU NEGATIVE 08/15/2017 1647   HGBUR SMALL (A) 08/15/2017 1647   BILIRUBINUR NEGATIVE 08/15/2017 1647   KETONESUR NEGATIVE 08/15/2017 1647   PROTEINUR 30 (A) 08/15/2017 1647   UROBILINOGEN 0.2 06/16/2014 0955   NITRITE POSITIVE (A) 08/15/2017 1647   LEUKOCYTESUR LARGE (A) 08/15/2017 1647     Ripudeep Rai M.D. [facility] 08/16/2017, 12:37 PM  Pager: [phone] Between 7am to 7pm - call Pager - [phone]  After 7pm go to [email or web address] -  password [email or web address]  Call night coverage person covering after 7pm

## 2017-08-16 NOTE — Consult Note (Signed)
[facility] Face-to-Face Psychiatry Consult   Reason for Consult:  SI Referring Physician:  Dr. Tana Coast Patient Identification: Yesenia [name] MRN:  [id number] Principal Diagnosis: MDD (major depressive disorder), recurrent severe, without psychosis ([place]) Diagnosis:   Patient Active Problem List   Diagnosis Date Noted  . Dysphagia [R13.10] 08/16/2017  . Nausea and vomiting [R11.2]   . Abdominal pain, epigastric [R10.13]   . AKI (acute kidney injury) ([place]) [N17.9] 08/15/2017  . Sleep apnea [G47.30] 03/20/2017  . Acute on chronic respiratory failure with hypoxemia ([place]) [J96.21] 09/30/2016  . Acute tracheitis [J04.10] 09/30/2016  . Abscess [L02.91]   . Complication of tracheostomy ([place]) [J95.00] 06/23/2016  . Bleeding [R58] 06/23/2016  . Iron deficiency anemia [D50.9] 05/26/2016  . 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 Irritable bowel syndrome [K58.9] 05/21/2016  . Anxiety state [F41.1] 05/21/2016  . 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 Generalized weakness [R53.1] 04/13/2016  . Weakness generalized [R53.1] 04/13/2016  . HCAP (healthcare-associated pneumonia) [J18.9] 03/06/2016  . Fibromyalgia [M79.7] 02/22/2016  . Cellulitis [L03.90] 02/21/2016  . Abdominal pain [R10.9] 02/16/2016  . Constipation [K59.00] 02/16/2016  . CKD (chronic kidney disease) stage 3, GFR 30-59 ml/min (HCC) [N18.3] 02/16/2016  . Fever [R50.9]   . Cough [R05]   . OSA (obstructive sleep apnea) [G47.33] 02/13/2016  . Insomnia [G47.00] 08/10/2015  . Polyneuropathy [G62.9] 07/10/2015  . GERD (gastroesophageal reflux disease) [K21.9] 07/10/2015  . Acute kidney injury superimposed on chronic kidney disease ([place]) [N17.9, N18.9] 07/07/2015  . Gait disturbance [R26.9] 07/07/2015  . Morbid (severe) obesity due to excess calories ([place]) complicated by OSA [W46.65] 07/07/2015  . Left hip pain [M25.552] 07/06/2015  . Chronic pain [G89.29]   . Right sided weakness [R53.1]   . Headache(784.0) [R51] 04/14/2013  .  Encephalopathy acute [G93.40] 04/11/2013  . Type 2 diabetes mellitus with stage 3 chronic kidney disease, without long-term current use of insulin ([place]) [L93.57, N18.3] 04/11/2013  . Dyslipidemia [E78.5] 04/11/2013  . Chest pain [R07.9] 02/15/2013  . Cerebral infarction ([place]) [I63.9] 01/14/2013  . Essential hypertension [I10] 01/14/2013  . Anemia [D64.9] 01/14/2013    Total Time spent with patient: 1 hour  Subjective:   Yesenia [name] is a 57 y.o. female patient admitted with dysphagia.  HPI:   Per chart review, patient has a history of diabetes type 2 who presented with dysphagia. She reports dysphagia to solids for several weeks that progressed to both solids and liquids over the past 2 weeks. She has not been able to keep food down. She has no prior history of dysphagia or endoscopy. She endorsed SI in the setting of recent medical condition so psychiatry was consulted. She has a history of anxiety and depression. She is prescribed Buspar 15 mg BID, Cymbalta 30 mg and Klonopin 1 mg qhs and Atarax 50 mg qhs.  On interview, Yesenia [name] reports that she has been depressed for several years. She reports the loss of her brother and mother in the late 42s. She reports difficulty falling asleep. She is unable to wear a CPAP due to feelings of claustrophobia. She previously had a tracheostomy but it was repaired in 05/2017. She reports poor appetite with a 14 pound weight loss over the past month as well as feelings of hopelessness and helplessness. She still enjoys watching television. She reports SI for the past 2 weeks which has increased in severity. She attempted suicide twice after her mother died in 70. She completed ECT which was helpful but she experienced memory loss. She was  recently started on Cymbalta 30 mg daily 2 weeks ago by her PCP and Buspar was discontinued. She denies HI or AVH.   Past Psychiatric History: Depression  Risk to Self: Is patient at risk for suicide?: No Risk to  Others:  None. Denies HI. Prior Inpatient Therapy:  She was hospitalized in 2000 after attempting suicide by drug overdose twice. Prior Outpatient Therapy:  She completed ECT with good response in 2000.   Past Medical History:  Past Medical History:  Diagnosis Date  . Anemia   . Anemia   . Chronic pain   . CKD (chronic kidney disease)   . Diabetes mellitus without complication (HCC)    diet controlled. does not check CBG's  . Fibromyalgia    hospitilized 12/16 due to inability to walk  . Hyperlipemia   . Hypertension   . Insomnia   . Neuropathy   . Right sided weakness   . Stroke [facility]) 2014?   no residual weakness   . Vitamin deficiency    Vit D    Past Surgical History:  Procedure Laterality Date  . [place]  . LEG SURGERY Right 2007   Surgery x3 2 arthroscopies and one rod  . OOPHORECTOMY Right 1993?  . TRACHEOSTOMY TUBE PLACEMENT N/A 02/13/2016   Procedure: TRACHEOSTOMY;  Surgeon: Melida Quitter, MD;  Location[facility];  Service: ENT;  Laterality: N/A;   Family History:  Family History  Problem Relation Age of Onset  . Breast cancer Mother   . Leukemia Brother   . Breast cancer Maternal Grandmother    Family Psychiatric  History: Denies   Social History:  Social History   Substance and Sexual Activity  Alcohol Use No  . Alcohol/week: 0.0 oz     Social History   Substance and Sexual Activity  Drug Use No    Social History   Socioeconomic History  . Marital status: Single    Spouse name: None  . Number of children: 2  . Years of education: 12th   . Highest education level: None  Social Needs  . Financial resource strain: None  . Food insecurity - worry: None  . Food insecurity - inability: None  . Transportation needs - medical: None  . Transportation needs - non-medical: None  Occupational History  . Occupation: [employer]  Tobacco Use  . Smoking status: Never Smoker  . Smokeless tobacco: Never Used  Substance and Sexual Activity   . Alcohol use: No    Alcohol/week: 0.0 oz  . Drug use: No  . Sexual activity: No  Other Topics Concern  . None  Social History Narrative   Reports no caffeine use    Additional Social History: She lives at home alone. She receives disability. She denies substance use. She reports a history of heavy alcohol use. She quit drinking two years ago.     Allergies:   Allergies  Allergen Reactions  . Buprenorphine Hcl Hives  . Morphine And Related Hives and Dermatitis    Labs:  Results for orders placed or performed during the hospital encounter of 08/15/17 (from the past 48 hour(s))  CBC with Differential     Status: None   Collection Time: 08/15/17  4:00 PM  Result Value Ref Range   WBC 8.0 4.0 - 10.5 K/uL   RBC 4.55 3.87 - 5.11 MIL/uL   Hemoglobin 12.2 12.0 - 15.0 g/dL   HCT 37.3 36.0 - 46.0 %   MCV 82.0 78.0 - 100.0  fL   MCH 26.8 26.0 - 34.0 pg   MCHC 32.7 30.0 - 36.0 g/dL   RDW 13.7 11.5 - 15.5 %   Platelets 374 150 - 400 K/uL   Neutrophils Relative % 48 %   Neutro Abs 3.9 1.7 - 7.7 K/uL   Lymphocytes Relative 40 %   Lymphs Abs 3.2 0.7 - 4.0 K/uL   Monocytes Relative 7 %   Monocytes Absolute 0.5 0.1 - 1.0 K/uL   Eosinophils Relative 4 %   Eosinophils Absolute 0.3 0.0 - 0.7 K/uL   Basophils Relative 1 %   Basophils Absolute 0.1 0.0 - 0.1 K/uL    Comment: Performed at [facility] [address] [place], [place] [address]  Comprehensive metabolic panel     Status: Abnormal   Collection Time: 08/15/17  4:00 PM  Result Value Ref Range   Sodium 142 135 - 145 mmol/L   Potassium 3.4 (L) 3.5 - 5.1 mmol/L   Chloride 107 101 - 111 mmol/L   CO2 21 (L) 22 - 32 mmol/L   Glucose, Bld 140 (H) 65 - 99 mg/dL   BUN 13 6 - 20 mg/dL   Creatinine, Ser 1.54 (H) 0.44 - 1.00 mg/dL   Calcium 9.0 8.9 - 10.3 mg/dL   Total Protein 7.6 6.5 - 8.1 g/dL   Albumin 3.9 3.5 - 5.0 g/dL   AST 21 15 - 41 U/L   ALT 21 14 - 54 U/L   Alkaline Phosphatase 111 38 - 126 U/L   Total Bilirubin  0.5 0.3 - 1.2 mg/dL   GFR calc non Af Amer 37 (L) >60 mL/min   GFR calc Af Amer 42 (L) >60 mL/min    Comment: (NOTE) The eGFR has been calculated using the CKD EPI equation. This calculation has not been validated in all clinical situations. eGFR's persistently <60 mL/min signify possible Chronic Kidney Disease.    Anion gap 14 5 - 15    Comment: Performed at [facility] [address], [place], [place] [address]  Lipase, blood     Status: None   Collection Time: 08/15/17  4:00 PM  Result Value Ref Range   Lipase 40 11 - 51 U/L    Comment: Performed at [facility] [address], [place], [place] [address]  Urinalysis, Routine w reflex microscopic     Status: Abnormal   Collection Time: 08/15/17  4:47 PM  Result Value Ref Range   Color, Urine YELLOW YELLOW   APPearance CLOUDY (A) CLEAR   Specific Gravity, Urine 1.017 1.005 - 1.030   pH 5.0 5.0 - 8.0   Glucose, UA NEGATIVE NEGATIVE mg/dL   Hgb urine dipstick SMALL (A) NEGATIVE   Bilirubin Urine NEGATIVE NEGATIVE   Ketones, ur NEGATIVE NEGATIVE mg/dL   Protein, ur 30 (A) NEGATIVE mg/dL   Nitrite POSITIVE (A) NEGATIVE   Leukocytes, UA LARGE (A) NEGATIVE   RBC / HPF 6-30 0 - 5 RBC/hpf   WBC, UA TOO NUMEROUS TO COUNT 0 - 5 WBC/hpf   Bacteria, UA MANY (A) NONE SEEN   Squamous Epithelial / LPF 6-30 (A) NONE SEEN   Mucus PRESENT    Hyaline Casts, UA PRESENT     Comment: Performed at [facility], [place] [address] [place], [place] [address]  TSH     Status: None   Collection Time: 08/16/17 12:11 AM  Result Value Ref Range   TSH 1.193 0.350 - 4.500 uIU/mL  Comment: Performed by a 3rd Generation assay with a functional sensitivity of <=0.01 uIU/mL. Performed at [facility], [place] [address] [place], [place] [address]   Vitamin B12     Status: None   Collection Time: 08/16/17 12:11 AM  Result Value Ref Range   Vitamin B-12 474 180 - 914 pg/mL    Comment: Performed at [facility]  [address] [place], [place] [address]  Ammonia     Status: None   Collection Time: 08/16/17 12:11 AM  Result Value Ref Range   Ammonia 26 9 - 35 umol/L    Comment: Performed at [facility], [place] [address], [place], [place] [address]  Glucose, capillary     Status: Abnormal   Collection Time: 08/16/17 12:52 AM  Result Value Ref Range   Glucose-Capillary 126 (H) 65 - 99 mg/dL  Comprehensive metabolic panel     Status: Abnormal   Collection Time: 08/16/17  3:50 AM  Result Value Ref Range   Sodium 142 135 - 145 mmol/L   Potassium 3.1 (L) 3.5 - 5.1 mmol/L   Chloride 105 101 - 111 mmol/L   CO2 23 22 - 32 mmol/L   Glucose, Bld 131 (H) 65 - 99 mg/dL   BUN 17 6 - 20 mg/dL   Creatinine, Ser 2.03 (H) 0.44 - 1.00 mg/dL   Calcium 8.7 (L) 8.9 - 10.3 mg/dL   Total Protein 6.8 6.5 - 8.1 g/dL   Albumin 3.5 3.5 - 5.0 g/dL   AST 17 15 - 41 U/L   ALT 19 14 - 54 U/L   Alkaline Phosphatase 100 38 - 126 U/L   Total Bilirubin 0.6 0.3 - 1.2 mg/dL   GFR calc non Af Amer 26 (L) >60 mL/min   GFR calc Af Amer 30 (L) >60 mL/min    Comment: (NOTE) The eGFR has been calculated using the CKD EPI equation. This calculation has not been validated in all clinical situations. eGFR's persistently <60 mL/min signify possible Chronic Kidney Disease.    Anion gap 14 5 - 15    Comment: Performed at [facility] [address], [place], [place] [address]  CBC     Status: Abnormal   Collection Time: 08/16/17  3:50 AM  Result Value Ref Range   WBC 10.2 4.0 - 10.5 K/uL   RBC 4.20 3.87 - 5.11 MIL/uL   Hemoglobin 11.2 (L) 12.0 - 15.0 g/dL   HCT 35.1 (L) 36.0 - 46.0 %   MCV 83.6 78.0 - 100.0 fL   MCH 26.7 26.0 - 34.0 pg   MCHC 31.9 30.0 - 36.0 g/dL   RDW 14.3 11.5 - 15.5 %   Platelets 329 150 - 400 K/uL    Comment: Performed at [facility], [place] [address] [place], [place] [address]  Protime-INR     Status: None   Collection Time: 08/16/17  3:50 AM  Result Value Ref Range   Prothrombin Time 15.1  11.4 - 15.2 seconds   INR 1.20     Comment: Performed at [facility] [address], [place], [place] [address]  APTT     Status: None   Collection Time: 08/16/17  3:50 AM  Result Value Ref Range   aPTT 34 24 - 36 seconds    Comment: Performed at [facility] [address], [place], [place] [address]  Glucose, capillary     Status: Abnormal   Collection Time:  08/16/17  5:10 AM  Result Value Ref Range   Glucose-Capillary 123 (H) 65 - 99 mg/dL  Glucose, capillary     Status: Abnormal   Collection Time: 08/16/17  8:17 AM  Result Value Ref Range   Glucose-Capillary 144 (H) 65 - 99 mg/dL  Glucose, capillary     Status: Abnormal   Collection Time: 08/16/17 10:57 AM  Result Value Ref Range   Glucose-Capillary 162 (H) 65 - 99 mg/dL    Current Facility-Administered Medications  Medication Dose Route Frequency Provider Last Rate Last Dose  . 0.9 %  sodium chloride infusion   Intravenous Continuous Rai, Ripudeep K, MD      . acetaminophen (TYLENOL) tablet 650 mg  650 mg Oral Q6H PRN Amin, Jeanella Flattery, MD   650 mg at 08/16/17 0314   Or  . acetaminophen (TYLENOL) suppository 650 mg  650 mg Rectal Q6H PRN Amin, Ankit Chirag, MD      . albuterol (PROVENTIL) (2.5 MG/3ML) 0.083% nebulizer solution 2.5 mg  2.5 mg Inhalation Q6H PRN Amin, Ankit Chirag, MD      . amLODipine (NORVASC) tablet 10 mg  10 mg Oral Daily Amin, Ankit Chirag, MD   10 mg at 08/16/17 1011  . atenolol (TENORMIN) tablet 50 mg  50 mg Oral Daily Amin, Ankit Chirag, MD   50 mg at 08/16/17 1011  . busPIRone (BUSPAR) tablet 15 mg  15 mg Oral BID Amin, Ankit Chirag, MD   15 mg at 08/16/17 1011  . cefTRIAXone (ROCEPHIN) 1 g in dextrose 5 % 50 mL IVPB  1 g Intravenous Q24H Amin, Ankit Chirag, MD      . clonazePAM (KLONOPIN) tablet 1 mg  1 mg Oral QHS Amin, Ankit Chirag, MD   1 mg at 08/16/17 0046  . dextrose 5 %-0.45 % sodium chloride infusion   Intravenous Continuous Amin, Ankit Chirag, MD 75 mL/hr at 08/16/17 0054    .  docusate sodium (COLACE) capsule 100 mg  100 mg Oral BID Amin, Ankit Chirag, MD   100 mg at 08/16/17 1011  . DULoxetine (CYMBALTA) DR capsule 30 mg  30 mg Oral Daily Amin, Ankit Chirag, MD   30 mg at 08/16/17 1011  . gabapentin (NEURONTIN) capsule 900 mg  900 mg Oral QHS Amin, Ankit Chirag, MD   900 mg at 08/16/17 0045  . hydrOXYzine (ATARAX/VISTARIL) tablet 50 mg  50 mg Oral QHS Amin, Ankit Chirag, MD   50 mg at 08/16/17 0045  . hyoscyamine (LEVSIN SL) SL tablet 0.125 mg  0.125 mg Oral Q6H PRN Amin, Jeanella Flattery, MD      . Derrill Memo ON 08/17/2017] Influenza vac split quadrivalent PF (FLUARIX) injection 0.5 mL  0.5 mL Intramuscular Tomorrow-1000 Rai, Ripudeep K, MD      . insulin aspart (novoLOG) injection 0-5 Units  0-5 Units Subcutaneous QHS Amin, Ankit Chirag, MD      . insulin aspart (novoLOG) injection 0-9 Units  0-9 Units Subcutaneous Q6H Amin, Jeanella Flattery, MD   1 Units at 08/16/17 0517  . pantoprazole (PROTONIX) injection 40 mg  40 mg Intravenous Q12H Armbruster, Carlota Raspberry, MD      . promethazine (PHENERGAN) injection 12.5 mg  12.5 mg Intravenous Q6H Armbruster, Carlota Raspberry, MD      . rosuvastatin (CRESTOR) tablet 10 mg  10 mg Oral Daily Amin, Ankit Chirag, MD   10 mg at 08/16/17 1011    Musculoskeletal: Strength & Muscle Tone: within normal limits Gait & Station: UTA since patient  was lying in bed. Patient leans: N/A  Psychiatric Specialty Exam: Physical Exam  Nursing note and vitals reviewed. Constitutional: She is oriented to person, place, and time. She appears well-developed and well-nourished.  HENT:  Head: Normocephalic and atraumatic.  Neck: Normal range of motion.  Respiratory: Effort normal.  Musculoskeletal: Normal range of motion.  Neurological: She is alert and oriented to person, place, and time.  Skin: No rash noted.  Psychiatric: Her speech is normal and behavior is normal. Judgment and thought content normal. Cognition and memory are normal. She exhibits a depressed mood.     Review of Systems  Gastrointestinal: Positive for abdominal pain. Negative for constipation, diarrhea, nausea and vomiting.  Musculoskeletal: Positive for back pain.  Psychiatric/Behavioral: Positive for depression and suicidal ideas. Negative for hallucinations and substance abuse. The patient is nervous/anxious and has insomnia.     Blood pressure 108/68, pulse 65, temperature 97.7 F (36.5 C), temperature source Oral, resp. rate 15, height '5\' 7"'  (1.702 m), weight (!) 141.5 kg (312 lb), SpO2 100 %.Body mass index is 48.87 kg/m.  General Appearance: Well Groomed, overweight, middle aged, African American female, wearing a hospital gown with a hair cap and lying in bed. NAD.   Eye Contact:  Good  Speech:  Clear and Coherent and Normal Rate  Volume:  Normal  Mood:  Depressed  Affect:  Congruent  Thought Process:  Goal Directed and Linear  Orientation:  Full (Time, Place, and Person)  Thought Content:  Logical  Suicidal Thoughts:  Yes.  without intent/plan  Homicidal Thoughts:  No  Memory:  Immediate;   Good Recent;   Good Remote;   Good  Judgement:  Good  Insight:  Good  Psychomotor Activity:  Normal  Concentration:  Concentration: Good and Attention Span: Good  Recall:  Good  Fund of Knowledge:  Good  Language:  Poor  Akathisia:  No  Handed:  Right  AIMS (if indicated):   N/A  Assets:  Communication Skills Desire for Improvement Financial Resources/Insurance Housing Social Support  ADL's:  Intact  Cognition:  WNL  Sleep:   Poor   Assessment:  Yesenia [name] is a 57 y.o. female who was admitted with dysphagia. She endorses SI and depression with neurovegetative symptoms in the setting of psychosocial stressors. She reports worsening SI over the past two weeks. She has a history of prior suicide attempts and is a high risk of harm to self. She warrants inpatient psychiatric hospitalization for stabilization and treatment.   Treatment Plan Summary: -Patient warrants  inpatient psychiatric hospitalization given high risk of harm to self. -Continue Engineer, materials.  -Continue home medications: Continue Cymbalta 30 mg daily for depression.  -Discontinue Buspar since patient reports that she has not be taking this medication.  -Please pursue involuntary commitment if patient refuses voluntary psychiatric hospitalization or attempts to leave the hospital.  -Will sign off on patient at this time. Please consult psychiatry again as needed.     Disposition: Recommend psychiatric Inpatient admission when medically cleared.  Faythe Dingwall, DO 08/16/2017 1:22 PM

## 2017-08-16 NOTE — Consult Note (Signed)
[facility] Consult: 9:35 AM 08/16/2017  LOS: 1 day    Referring Provider: Damita Lack, MD  Primary Care Physician:  Everardo Beals, NP Primary Gastroenterologist:  Dr. [facility]   Reason for Consultation:  Abdominal pain/N/V/D   HPI: Yesenia [name] is a 57 y.o. female w/hx of CKD stg II-3, DM2, HTN, morbid obesity, OSA s/p tracheostomy, being seen in the hospital today for complaints of abdominal pain, diarrhea, nausea and vomiting. She was admitted yday for AKI but she has been seen by Nevin Bloodgood in office for the same complaints since October 2018. She has been tried on imodium, hyoscyamine, lomotil for diarrhea without improvement. She was also started on Omeprazole in Nov 2018 for complaints of postprandial abd pain w/o relief. She was scheduled for EGD and colonoscopy to be done on 08/29/17 but since she has been having persistent sxs and is in the hospital she will receive an EGD and colonoscopy today with Dr. Havery Moros.  Today she reports that she feels slightly better but she is still having diffuse abdominal pain that is more significant in the suprapubic region. She says that her pain is dull, achy/gnawing and constant. She is also having approx 3 episodes of postprandial diarrhea which occurs 5-10 mins after eating. She has persistent nausea regardless of food intake, mild-mod relief w/zofran, however she reports that she has been having postprandial vomiting 5-10 mins after. Also reports loss of appetite secondary to her abd pain, N/V/D. Denies hematemesis, hematochezia, fever, dysphagia or other complaints.   On a previous note, it was noted that Dr. Benson Norway tx'd the pt for similar complaints in the past and he tried her on gluten free diet which resolved her sxs. When asked the pt about this  today she said that she was told by Dr. Benson Norway that gluten was not the problem and she has since discontinued her gluten free diet. She does not recall any association b/w d/c of gluten free diet and the return of her sxs.   Past Medical History:  Diagnosis Date  . Anemia   . Anemia   . Chronic pain   . CKD (chronic kidney disease)   . Diabetes mellitus without complication (HCC)    diet controlled. does not check CBG's  . Fibromyalgia    hospitilized 12/16 due to inability to walk  . Hyperlipemia   . Hypertension   . Insomnia   . Neuropathy   . Right sided weakness   . Stroke [facility]) 2014?   no residual weakness   . Vitamin deficiency    Vit D    Past Surgical History:  Procedure Laterality Date  . [place]  . LEG SURGERY Right 2007   Surgery x3 2 arthroscopies and one rod  . OOPHORECTOMY Right 1993?  . TRACHEOSTOMY TUBE PLACEMENT N/A 02/13/2016   Procedure: TRACHEOSTOMY;  Surgeon: Melida Quitter, MD;  Location: [facility];  Service: ENT;  Laterality: N/A;    Prior to Admission medications   Medication Sig Start Date End Date Taking? Authorizing Provider  albuterol (PROVENTIL HFA;VENTOLIN HFA) 108 (90 BASE) MCG/ACT inhaler Inhale 2 puffs into the lungs every 6 (six) hours as needed for wheezing or shortness of breath.    Yes [provider]  amLODipine (NORVASC) 10 MG tablet Take 10 mg by mouth daily. 09/07/16  Yes [provider]  atenolol (TENORMIN) 50 MG tablet Take 50 mg by mouth daily. 02/16/16  Yes [provider]  busPIRone (BUSPAR) 15 MG tablet Take 15 mg by mouth 2 (two) times daily. 08/01/17  Yes [provider]  clonazePAM (KLONOPIN) 1 MG tablet Take 1 tablet (1 mg total) by mouth at bedtime. 06/28/16  Yes Lauree Chandler, NP  DULoxetine (CYMBALTA) 30 MG capsule Take 30 mg by mouth daily.   Yes [provider]  gabapentin (NEURONTIN) 300 MG capsule Take 900 mg by mouth at bedtime. 08/01/17  Yes [provider]  hydrOXYzine (ATARAX/VISTARIL) 50 MG tablet Take 50 mg by mouth at bedtime. 02/09/17  Yes [provider]  hyoscyamine (LEVSIN SL) 0.125 MG SL tablet TAKE 0.125 mg TABLET BY MOUTH FOUR TIMES DAILY FOR IBS 04/15/17  Yes [provider]  ibuprofen (ADVIL,MOTRIN) 800 MG tablet Take 800 mg by mouth every 8 (eight) hours as needed for headache or moderate pain.   Yes [provider]  metFORMIN (GLUCOPHAGE) 500 MG tablet Take 500 mg by mouth daily. 07/19/17  Yes [provider]  omeprazole (PRILOSEC) 40 MG capsule Take 1 capsule (40 mg total) by mouth 2 (two) times daily before a meal. 05/12/17  Yes Armbruster, Carlota Raspberry, MD  ondansetron (ZOFRAN-ODT) 4 MG disintegrating tablet Take 4 mg by mouth every 8 (eight) hours as needed for nausea or vomiting.  02/21/17  Yes [provider]  Polyethylene Glycol 3350 (PEG 3350) POWD Take 17 g by mouth daily as needed. 11/02/16  Yes [provider]  rosuvastatin (CRESTOR) 10 MG tablet Take 10 mg by mouth daily.   Yes [provider]  Vitamin D, Ergocalciferol, (DRISDOL) 50000 units CAPS capsule Take 50,000 Units by mouth every Monday. 02/18/17  Yes [provider]  Na Sulfate-K Sulfate-Mg Sulf 17.5-3.13-1.6 GM/177ML SOLN Take 1 kit as directed by mouth. 05/23/17   Armbruster, Carlota Raspberry, MD  traMADol (ULTRAM) 50 MG tablet Take 1 tablet (50 mg total) by mouth every 6 (six) hours as needed for moderate pain or severe pain. Patient not taking: Reported on 06/27/2017 06/28/16   Lauree Chandler, NP    Scheduled Meds: . amLODipine  10 mg Oral Daily  . atenolol  50 mg Oral Daily  . busPIRone  15 mg Oral BID  . clonazePAM  1 mg Oral QHS  . docusate sodium  100 mg Oral BID  . DULoxetine  30 mg Oral Daily  . gabapentin  900 mg Oral QHS  . hydrOXYzine  50 mg Oral QHS  . insulin aspart  0-5 Units Subcutaneous QHS  . insulin aspart  0-9 Units Subcutaneous Q6H  . pantoprazole  40 mg Oral Daily    . rosuvastatin  10 mg Oral Daily   Infusions: . cefTRIAXone (ROCEPHIN)  IV    . dextrose 5 % and 0.45% NaCl 75 mL/hr at 08/16/17 0054   PRN Meds: acetaminophen **OR** acetaminophen, albuterol, hyoscyamine, ondansetron **OR** ondansetron (ZOFRAN) IV   Allergies as of 08/15/2017 - Review Complete 08/15/2017  Allergen Reaction Noted  . Buprenorphine hcl Hives 01/14/2013  . Morphine and related Hives and Dermatitis 01/14/2013    Family History  Problem Relation  Age of Onset  . Breast cancer Mother   . Leukemia Brother   . Breast cancer Maternal Grandmother     Social History   Socioeconomic History  . Marital status: Single    Spouse name: Not on file  . Number of children: 2  . Years of education: 12th   . Highest education level: Not on file  Social Needs  . Financial resource strain: Not on file  . Food insecurity - worry: Not on file  . Food insecurity - inability: Not on file  . Transportation needs - medical: Not on file  . Transportation needs - non-medical: Not on file  Occupational History  . Occupation: [employer]  Tobacco Use  . Smoking status: Never Smoker  . Smokeless tobacco: Never Used  Substance and Sexual Activity  . Alcohol use: No    Alcohol/week: 0.0 oz  . Drug use: No  . Sexual activity: No  Other Topics Concern  . Not on file  Social History Narrative   Reports no caffeine use     REVIEW OF SYSTEMS: Constitutional:  Positive for fatigue and weakness Pulm:  No SOB, dyspnea CV:  No palpitations, no LE edema.  GI: Positive for N/V/D, abdominal pain, loss of appetite. No hematemesis, hematochezia, melena. GU: Positive for suprapubic pain, dysuria. No hematuria.   PHYSICAL EXAM: Vital signs in last 24 hours: Vitals:   08/15/17 2300 08/16/17 0636  BP: 99/62 101/63  Pulse:  71  Resp: 18 17  Temp: 98 F (36.7 C) 98.2 F (36.8 C)  SpO2: 100% 100%   Wt Readings from Last 3 Encounters:  08/15/17 (!) 141.5 kg (312 lb)  05/12/17 (!) 145.7 kg  (321 lb 4 oz)  04/27/17 (!) 146.5 kg (323 lb)    General: Obese female, in mild discomfort, no acute distress Head:  Atraumatic and normocephalic Eyes:  EOM intact, PERRLA Neck:  S/p tracheostomy- well healed scar Lungs: CTAB Heart: RRR, no mgr Abdomen:  Soft abdomen. mild-mod TTP in epigastric, suprapubic, LUQ and LLQ regions. BS positive. Rectal: deferred until colonoscopy   Extremities:  No edema. Psych: AOx3  Intake/Output from previous day: 02/04 0701 - 02/05 0700 In: 382.5 [I.V.:382.5] Out: -  Intake/Output this shift: No intake/output data recorded.  LAB RESULTS: Recent Labs    08/15/17 1600 08/16/17 0350  WBC 8.0 10.2  HGB 12.2 11.2*  HCT 37.3 35.1*  PLT 374 329   BMET Lab Results  Component Value Date   NA 142 08/16/2017   NA 142 08/15/2017   NA 142 02/06/2017   K 3.1 (L) 08/16/2017   K 3.4 (L) 08/15/2017   K 3.3 (L) 02/06/2017   CL 105 08/16/2017   CL 107 08/15/2017   CL 106 02/06/2017   CO2 23 08/16/2017   CO2 21 (L) 08/15/2017   CO2 26 02/06/2017   GLUCOSE 131 (H) 08/16/2017   GLUCOSE 140 (H) 08/15/2017   GLUCOSE 132 (H) 02/06/2017   BUN 17 08/16/2017   BUN 13 08/15/2017   BUN 13 02/06/2017   CREATININE 2.03 (H) 08/16/2017   CREATININE 1.54 (H) 08/15/2017   CREATININE 1.03 (H) 02/06/2017   CALCIUM 8.7 (L) 08/16/2017   CALCIUM 9.0 08/15/2017   CALCIUM 8.8 (L) 02/06/2017   LFT Recent Labs    08/15/17 1600 08/16/17 0350  PROT 7.6 6.8  ALBUMIN 3.9 3.5  AST 21 17  ALT 21 19  ALKPHOS 111 100  BILITOT 0.5 0.6   PT/INR Lab Results  Component Value Date     INR 1.20 08/16/2017   INR 1.16 09/30/2016   INR 1.26 03/07/2016   Hepatitis Panel No results for input(s): HEPBSAG, HCVAB, HEPAIGM, HEPBIGM in the last 72 hours. C-Diff No components found for: CDIFF Lipase     Component Value Date/Time   LIPASE 40 08/15/2017 1600    Drugs of Abuse     Component Value Date/Time   LABOPIA NONE DETECTED 06/16/2014 0955   COCAINSCRNUR NONE  DETECTED 06/16/2014 0955   LABBENZ NONE DETECTED 06/16/2014 0955   AMPHETMU NONE DETECTED 06/16/2014 0955   THCU NONE DETECTED 06/16/2014 0955   LABBARB NONE DETECTED 06/16/2014 0955     RADIOLOGY STUDIES: Dg Chest 2 View  Result Date: 08/15/2017 CLINICAL DATA:  Subacute onset of generalized abdominal pain, chest pain and dizziness. EXAM: CHEST  2 VIEW COMPARISON:  Chest radiograph performed 03/19/2017 FINDINGS: The lungs are well-aerated. Minimal right basilar atelectasis is noted. There is no evidence of pleural effusion or pneumothorax. The heart is borderline normal in size. No acute osseous abnormalities are seen. IMPRESSION: Minimal right basilar atelectasis noted.  Lungs otherwise clear. Electronically Signed   By: Jeffery  Chang M.D.   On: 08/15/2017 21:45   Ct Head Wo Contrast  Result Date: 08/15/2017 CLINICAL DATA:  Acute onset of severe left-sided headache, nausea and vomiting. EXAM: CT HEAD WITHOUT CONTRAST TECHNIQUE: Contiguous axial images were obtained from the base of the skull through the vertex without intravenous contrast. COMPARISON:  CT of the head performed 09/24/2014, and MRI of the brain performed 04/13/2016 FINDINGS: Brain: No evidence of acute infarction, hemorrhage, hydrocephalus, extra-axial collection or mass lesion/mass effect. Scattered periventricular and subcortical white matter change likely reflects small vessel ischemic microangiopathy, similar in appearance to 2016. The posterior fossa, including the cerebellum, brainstem and fourth ventricle, is within normal limits. The third and lateral ventricles, and basal ganglia are unremarkable in appearance. No mass effect or midline shift is seen. Vascular: No hyperdense vessel or unexpected calcification. Mildly increased attenuation within the vasculature is thought to be artifactual in nature. Skull: There is no evidence of fracture; visualized osseous structures are unremarkable in appearance. Sinuses/Orbits: The  visualized portions of the orbits are within normal limits. The paranasal sinuses and mastoid air cells are well-aerated. Other: No significant soft tissue abnormalities are seen. IMPRESSION: 1. No acute intracranial pathology seen on CT. 2. Scattered small vessel ischemic microangiopathy. Electronically Signed   By: Jeffery  Chang M.D.   On: 08/15/2017 21:42   Us Soft Tissue Head & Neck (non-thyroid)  Result Date: 08/15/2017 CLINICAL DATA:  Dysphagia, history of tracheostomy removal EXAM: ULTRASOUND OF HEAD/NECK SOFT TISSUES TECHNIQUE: Ultrasound examination of the head and neck soft tissues was performed in the area of clinical concern. COMPARISON:  CT 02/06/2017 FINDINGS: Targeted ultrasound of the neck in the region of tracheostomy scar is performed. No cyst or solid mass is visualized. IMPRESSION: No cyst/fluid collection or solid mass to correspond to palpable area near the tracheostomy scar. Electronically Signed   By: Kim  Fujinaga M.D.   On: 08/15/2017 23:07   Ct Abdomen Pelvis W Contrast  Result Date: 08/15/2017 CLINICAL DATA:  Abdominal pain and vomiting and diarrhea. EXAM: CT ABDOMEN AND PELVIS WITH CONTRAST TECHNIQUE: Multidetector CT imaging of the abdomen and pelvis was performed using the standard protocol following bolus administration of intravenous contrast. CONTRAST:  75mL ISOVUE-300 IOPAMIDOL (ISOVUE-300) INJECTION 61% COMPARISON:  CT scan dated 04/11/2013 FINDINGS: Lower chest: Normal. Hepatobiliary: Hepatic steatosis. No focal lesions. Biliary tree is normal. Pancreas: Unremarkable. No pancreatic   ductal dilatation or surrounding inflammatory changes. Spleen: Normal in size without focal abnormality. Adrenals/Urinary Tract: Adrenal glands are normal. 14 mm cyst in the lower pole of the left kidney, slightly increased in size since 2014. 4 cm slightly complex cyst in the lower pole of the right kidney also slightly increased in size, but otherwise unchanged. No hydronephrosis. Bladder is  empty. Stomach/Bowel: Stomach is within normal limits. Appendix appears normal. No evidence of bowel wall thickening, distention, or inflammatory changes. Vascular/Lymphatic: No significant vascular findings are present. No enlarged abdominal or pelvic lymph nodes. Reproductive: Uterus and bilateral adnexa are unremarkable. Other: No abdominal wall hernia or abnormality. No abdominopelvic ascites. Musculoskeletal: No acute abnormality. 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 IMPRESSION: 1. No acute abnormalities. 2. Chronic appendix steatosis. No significant change in cystic lesions in both kidneys. Electronically Signed   By: Lorriane Shire M.D.   On: 08/15/2017 17:41    ENDOSCOPIC STUDIES: EGD and Colonoscopy to be performed on 08/16/17 at approx 11 AM.  IMPRESSION:   Active problems:   AKI/UTI- Likely d/t CKD, dehydration and UTI. Pt is on IV Rocephin, followed by pharmacy. Urine culture pending. She is on IV fluids and I&Os being monitored. Scr today is 2.03, yesterday was 1.54. GFR today is 30, yday w 16.     GERD/Abdominal Pain/N/V/D: Possible etiologies include gastroparesis, SBO, celiac dz or other underlying malabsorptive dz/mass/malignancy. She is currently on Zofran prn, colace BID, and Protonix BID. Pt to receive inpatient EGD today with Dr. Havery Moros. She complained of dysphagia yesterday but did not report any issues with it today. CT of abd/pelvis and CT head WNL.    Anemia- not critical, her Hgb and Hct yesterday was 12.2 and 37.3 (wnl), today is slightly decreased at 11.2 Hgb and 35.1 Hct. Will assess for any underlying acute blood loss during endoscopy procedures today. Could be d/t CKD with AKI or dehydration, will monitor.   DM type 2: Not on any home meds, continue sliding scale insulin while inpatient, CBGs being monitored- most recent 140.   HTN: Continue amlodipine and atenolol   HLD: Continue rosuvastatin   Hypokalemia- Potassium was  3.1 today, she is not having any sxs at the moment. Hospitalist to follow, can order potassium po after endoscopy.   Suicidal ideations- Pt has recent hx of suicidal ideations and visual hallucinations for last 2 weeks. She attempted suicide with gabapentin few weeks ago. Continue meds- clonazepam, duloxetine, hydroxyzine.    PLAN:     Continue monitoring BMP for kidney function and potassium level.   Continue monitoring CBC to assess anemia.  Continue checking CBGs  Can d/c NPO after endoscopy.   Posey Pronto  08/16/2017, 9:35 AM Pager: [phone]

## 2017-08-16 NOTE — Interval H&P Note (Signed)
History and Physical Interval Note:  08/16/2017 12:09 PM  Yesenia [name]  has presented today for surgery, with the diagnosis of nausea / vomiting  The various methods of treatment have been discussed with the patient and family. After consideration of risks, benefits and other options for treatment, the patient has consented to  Procedure(s): ESOPHAGOGASTRODUODENOSCOPY (EGD) WITH PROPOFOL (N/A) as a surgical intervention .  The patient's history has been reviewed, patient examined, no change in status, stable for surgery.  I have reviewed the patient's chart and labs.  Questions were answered to the patient's satisfaction.     Viviann SpareSteven P Lemont Sitzmann

## 2017-08-16 NOTE — H&P (View-Only) (Signed)
[facility] Consult: 9:35 AM 08/16/2017  LOS: 1 day    Referring Provider: Damita Lack, MD  Primary Care Physician:  Everardo Beals, NP Primary Gastroenterologist:  Dr. [facility]   Reason for Consultation:  Abdominal pain/N/V/D   HPI: Yesenia [name] is a 57 y.o. female w/hx of CKD stg II-3, DM2, HTN, morbid obesity, OSA s/p tracheostomy, being seen in the hospital today for complaints of abdominal pain, diarrhea, nausea and vomiting. She was admitted yday for AKI but she has been seen by Nevin Bloodgood in office for the same complaints since October 2018. She has been tried on imodium, hyoscyamine, lomotil for diarrhea without improvement. She was also started on Omeprazole in Nov 2018 for complaints of postprandial abd pain w/o relief. She was scheduled for EGD and colonoscopy to be done on 08/29/17 but since she has been having persistent sxs and is in the hospital she will receive an EGD and colonoscopy today with Dr. Havery Moros.  Today she reports that she feels slightly better but she is still having diffuse abdominal pain that is more significant in the suprapubic region. She says that her pain is dull, achy/gnawing and constant. She is also having approx 3 episodes of postprandial diarrhea which occurs 5-10 mins after eating. She has persistent nausea regardless of food intake, mild-mod relief w/zofran, however she reports that she has been having postprandial vomiting 5-10 mins after. Also reports loss of appetite secondary to her abd pain, N/V/D. Denies hematemesis, hematochezia, fever, dysphagia or other complaints.   On a previous note, it was noted that Dr. Benson Norway tx'd the pt for similar complaints in the past and he tried her on gluten free diet which resolved her sxs. When asked the pt about this  today she said that she was told by Dr. Benson Norway that gluten was not the problem and she has since discontinued her gluten free diet. She does not recall any association b/w d/c of gluten free diet and the return of her sxs.   Past Medical History:  Diagnosis Date  . Anemia   . Anemia   . Chronic pain   . CKD (chronic kidney disease)   . Diabetes mellitus without complication (HCC)    diet controlled. does not check CBG's  . Fibromyalgia    hospitilized 12/16 due to inability to walk  . Hyperlipemia   . Hypertension   . Insomnia   . Neuropathy   . Right sided weakness   . Stroke [facility]) 2014?   no residual weakness   . Vitamin deficiency    Vit D    Past Surgical History:  Procedure Laterality Date  . [place]  . LEG SURGERY Right 2007   Surgery x3 2 arthroscopies and one rod  . OOPHORECTOMY Right 1993?  . TRACHEOSTOMY TUBE PLACEMENT N/A 02/13/2016   Procedure: TRACHEOSTOMY;  Surgeon: Melida Quitter, MD;  Location: [facility];  Service: ENT;  Laterality: N/A;    Prior to Admission medications   Medication Sig Start Date End Date Taking? Authorizing Provider  albuterol (PROVENTIL HFA;VENTOLIN HFA) 108 (90 BASE) MCG/ACT inhaler Inhale 2 puffs into the lungs every 6 (six) hours as needed for wheezing or shortness of breath.    Yes [provider]  amLODipine (NORVASC) 10 MG tablet Take 10 mg by mouth daily. 09/07/16  Yes [provider]  atenolol (TENORMIN) 50 MG tablet Take 50 mg by mouth daily. 02/16/16  Yes [provider]  busPIRone (BUSPAR) 15 MG tablet Take 15 mg by mouth 2 (two) times daily. 08/01/17  Yes [provider]  clonazePAM (KLONOPIN) 1 MG tablet Take 1 tablet (1 mg total) by mouth at bedtime. 06/28/16  Yes Lauree Chandler, NP  DULoxetine (CYMBALTA) 30 MG capsule Take 30 mg by mouth daily.   Yes [provider]  gabapentin (NEURONTIN) 300 MG capsule Take 900 mg by mouth at bedtime. 08/01/17  Yes [provider]  hydrOXYzine (ATARAX/VISTARIL) 50 MG tablet Take 50 mg by mouth at bedtime. 02/09/17  Yes [provider]  hyoscyamine (LEVSIN SL) 0.125 MG SL tablet TAKE 0.125 mg TABLET BY MOUTH FOUR TIMES DAILY FOR IBS 04/15/17  Yes [provider]  ibuprofen (ADVIL,MOTRIN) 800 MG tablet Take 800 mg by mouth every 8 (eight) hours as needed for headache or moderate pain.   Yes [provider]  metFORMIN (GLUCOPHAGE) 500 MG tablet Take 500 mg by mouth daily. 07/19/17  Yes [provider]  omeprazole (PRILOSEC) 40 MG capsule Take 1 capsule (40 mg total) by mouth 2 (two) times daily before a meal. 05/12/17  Yes Armbruster, Carlota Raspberry, MD  ondansetron (ZOFRAN-ODT) 4 MG disintegrating tablet Take 4 mg by mouth every 8 (eight) hours as needed for nausea or vomiting.  02/21/17  Yes [provider]  Polyethylene Glycol 3350 (PEG 3350) POWD Take 17 g by mouth daily as needed. 11/02/16  Yes [provider]  rosuvastatin (CRESTOR) 10 MG tablet Take 10 mg by mouth daily.   Yes [provider]  Vitamin D, Ergocalciferol, (DRISDOL) 50000 units CAPS capsule Take 50,000 Units by mouth every Monday. 02/18/17  Yes [provider]  Na Sulfate-K Sulfate-Mg Sulf 17.5-3.13-1.6 GM/177ML SOLN Take 1 kit as directed by mouth. 05/23/17   Armbruster, Carlota Raspberry, MD  traMADol (ULTRAM) 50 MG tablet Take 1 tablet (50 mg total) by mouth every 6 (six) hours as needed for moderate pain or severe pain. Patient not taking: Reported on 06/27/2017 06/28/16   Lauree Chandler, NP    Scheduled Meds: . amLODipine  10 mg Oral Daily  . atenolol  50 mg Oral Daily  . busPIRone  15 mg Oral BID  . clonazePAM  1 mg Oral QHS  . docusate sodium  100 mg Oral BID  . DULoxetine  30 mg Oral Daily  . gabapentin  900 mg Oral QHS  . hydrOXYzine  50 mg Oral QHS  . insulin aspart  0-5 Units Subcutaneous QHS  . insulin aspart  0-9 Units Subcutaneous Q6H  . pantoprazole  40 mg Oral Daily    . rosuvastatin  10 mg Oral Daily   Infusions: . cefTRIAXone (ROCEPHIN)  IV    . dextrose 5 % and 0.45% NaCl 75 mL/hr at 08/16/17 0054   PRN Meds: acetaminophen **OR** acetaminophen, albuterol, hyoscyamine, ondansetron **OR** ondansetron (ZOFRAN) IV   Allergies as of 08/15/2017 - Review Complete 08/15/2017  Allergen Reaction Noted  . Buprenorphine hcl Hives 01/14/2013  . Morphine and related Hives and Dermatitis 01/14/2013    Family History  Problem Relation  Age of Onset  . Breast cancer Mother   . Leukemia Brother   . Breast cancer Maternal Grandmother     Social History   Socioeconomic History  . Marital status: Single    Spouse name: Not on file  . Number of children: 2  . Years of education: 12th   . Highest education level: Not on file  Social Needs  . Financial resource strain: Not on file  . Food insecurity - worry: Not on file  . Food insecurity - inability: Not on file  . Transportation needs - medical: Not on file  . Transportation needs - non-medical: Not on file  Occupational History  . Occupation: [employer]SI  Tobacco Use  . Smoking status: Never Smoker  . Smokeless tobacco: Never Used  Substance and Sexual Activity  . Alcohol use: No    Alcohol/week: 0.0 oz  . Drug use: No  . Sexual activity: No  Other Topics Concern  . Not on file  Social History Narrative   Reports no caffeine use     REVIEW OF SYSTEMS: Constitutional:  Positive for fatigue and weakness Pulm:  No SOB, dyspnea CV:  No palpitations, no LE edema.  GI: Positive for N/V/D, abdominal pain, loss of appetite. No hematemesis, hematochezia, melena. GU: Positive for suprapubic pain, dysuria. No hematuria.   PHYSICAL EXAM: Vital signs in last 24 hours: Vitals:   08/15/17 2300 08/16/17 0636  BP: 99/62 101/63  Pulse:  71  Resp: 18 17  Temp: 98 F (36.7 C) 98.2 F (36.8 C)  SpO2: 100% 100%   Wt Readings from Last 3 Encounters:  08/15/17 (!) 141.5 kg (312 lb)  05/12/17 (!) 145.7 kg  (321 lb 4 oz)  04/27/17 (!) 146.5 kg (323 lb)    General: Obese female, in mild discomfort, no acute distress Head:  Atraumatic and normocephalic Eyes:  EOM intact, PERRLA Neck:  S/p tracheostomy- well healed scar Lungs: CTAB Heart: RRR, no mgr Abdomen:  Soft abdomen. mild-mod TTP in epigastric, suprapubic, LUQ and LLQ regions. BS positive. Rectal: deferred until colonoscopy   Extremities:  No edema. Psych: AOx3  Intake/Output from previous day: 02/04 0701 - 02/05 0700 In: 382.5 [I.V.:382.5] Out: -  Intake/Output this shift: No intake/output data recorded.  LAB RESULTS: Recent Labs    08/15/17 1600 08/16/17 0350  WBC 8.0 10.2  HGB 12.2 11.2*  HCT 37.3 35.1*  PLT 374 329   BMET Lab Results  Component Value Date   NA 142 08/16/2017   NA 142 08/15/2017   NA 142 02/06/2017   K 3.1 (L) 08/16/2017   K 3.4 (L) 08/15/2017   K 3.3 (L) 02/06/2017   CL 105 08/16/2017   CL 107 08/15/2017   CL 106 02/06/2017   CO2 23 08/16/2017   CO2 21 (L) 08/15/2017   CO2 26 02/06/2017   GLUCOSE 131 (H) 08/16/2017   GLUCOSE 140 (H) 08/15/2017   GLUCOSE 132 (H) 02/06/2017   BUN 17 08/16/2017   BUN 13 08/15/2017   BUN 13 02/06/2017   CREATININE 2.03 (H) 08/16/2017   CREATININE 1.54 (H) 08/15/2017   CREATININE 1.03 (H) 02/06/2017   CALCIUM 8.7 (L) 08/16/2017   CALCIUM 9.0 08/15/2017   CALCIUM 8.8 (L) 02/06/2017   LFT Recent Labs    08/15/17 1600 08/16/17 0350  PROT 7.6 6.8  ALBUMIN 3.9 3.5  AST 21 17  ALT 21 19  ALKPHOS 111 100  BILITOT 0.5 0.6   PT/INR Lab Results  Component Value Date  INR 1.20 08/16/2017   INR 1.16 09/30/2016   INR 1.26 03/07/2016   Hepatitis Panel No results for input(s): HEPBSAG, HCVAB, HEPAIGM, HEPBIGM in the last 72 hours. C-Diff No components found for: CDIFF Lipase     Component Value Date/Time   LIPASE 40 08/15/2017 1600    Drugs of Abuse     Component Value Date/Time   LABOPIA NONE DETECTED 06/16/2014 0955   COCAINSCRNUR NONE  DETECTED 06/16/2014 0955   LABBENZ NONE DETECTED 06/16/2014 0955   AMPHETMU NONE DETECTED 06/16/2014 0955   THCU NONE DETECTED 06/16/2014 0955   LABBARB NONE DETECTED 06/16/2014 0955     RADIOLOGY STUDIES: Dg Chest 2 View  Result Date: 08/15/2017 CLINICAL DATA:  Subacute onset of generalized abdominal pain, chest pain and dizziness. EXAM: CHEST  2 VIEW COMPARISON:  Chest radiograph performed 03/19/2017 FINDINGS: The lungs are well-aerated. Minimal right basilar atelectasis is noted. There is no evidence of pleural effusion or pneumothorax. The heart is borderline normal in size. No acute osseous abnormalities are seen. IMPRESSION: Minimal right basilar atelectasis noted.  Lungs otherwise clear. Electronically Signed   By: Garald Balding M.D.   On: 08/15/2017 21:45   Ct Head Wo Contrast  Result Date: 08/15/2017 CLINICAL DATA:  Acute onset of severe left-sided headache, nausea and vomiting. EXAM: CT HEAD WITHOUT CONTRAST TECHNIQUE: Contiguous axial images were obtained from the base of the skull through the vertex without intravenous contrast. COMPARISON:  CT of the head performed 09/24/2014, and MRI of the brain performed 04/13/2016 FINDINGS: Brain: No evidence of acute infarction, hemorrhage, hydrocephalus, extra-axial collection or mass lesion/mass effect. Scattered periventricular and subcortical white matter change likely reflects small vessel ischemic microangiopathy, similar in appearance to 2016. The posterior fossa, including the cerebellum, brainstem and fourth ventricle, is within normal limits. The third and lateral ventricles, and basal ganglia are unremarkable in appearance. No mass effect or midline shift is seen. Vascular: No hyperdense vessel or unexpected calcification. Mildly increased attenuation within the vasculature is thought to be artifactual in nature. Skull: There is no evidence of fracture; visualized osseous structures are unremarkable in appearance. Sinuses/Orbits: The  visualized portions of the orbits are within normal limits. The paranasal sinuses and mastoid air cells are well-aerated. Other: No significant soft tissue abnormalities are seen. IMPRESSION: 1. No acute intracranial pathology seen on CT. 2. Scattered small vessel ischemic microangiopathy. Electronically Signed   By: Garald Balding M.D.   On: 08/15/2017 21:42   US Soft Tissue Head & Neck (non-thyroid)  Result Date: 08/15/2017 CLINICAL DATA:  Dysphagia, history of tracheostomy removal EXAM: ULTRASOUND OF HEAD/NECK SOFT TISSUES TECHNIQUE: Ultrasound examination of the head and neck soft tissues was performed in the area of clinical concern. COMPARISON:  CT 02/06/2017 FINDINGS: Targeted ultrasound of the neck in the region of tracheostomy scar is performed. No cyst or solid mass is visualized. IMPRESSION: No cyst/fluid collection or solid mass to correspond to palpable area near the tracheostomy scar. Electronically Signed   By: Donavan Foil M.D.   On: 08/15/2017 23:07   Ct Abdomen Pelvis W Contrast  Result Date: 08/15/2017 CLINICAL DATA:  Abdominal pain and vomiting and diarrhea. EXAM: CT ABDOMEN AND PELVIS WITH CONTRAST TECHNIQUE: Multidetector CT imaging of the abdomen and pelvis was performed using the standard protocol following bolus administration of intravenous contrast. CONTRAST:  50m ISOVUE-300 IOPAMIDOL (ISOVUE-300) INJECTION 61% COMPARISON:  CT scan dated 04/11/2013 FINDINGS: Lower chest: Normal. Hepatobiliary: Hepatic steatosis. No focal lesions. Biliary tree is normal. Pancreas: Unremarkable. No pancreatic  ductal dilatation or surrounding inflammatory changes. Spleen: Normal in size without focal abnormality. Adrenals/Urinary Tract: Adrenal glands are normal. 14 mm cyst in the lower pole of the left kidney, slightly increased in size since 2014. 4 cm slightly complex cyst in the lower pole of the right kidney also slightly increased in size, but otherwise unchanged. No hydronephrosis. Bladder is  empty. Stomach/Bowel: Stomach is within normal limits. Appendix appears normal. No evidence of bowel wall thickening, distention, or inflammatory changes. Vascular/Lymphatic: No significant vascular findings are present. No enlarged abdominal or pelvic lymph nodes. Reproductive: Uterus and bilateral adnexa are unremarkable. Other: No abdominal wall hernia or abnormality. No abdominopelvic ascites. Musculoskeletal: No acute abnormality. 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 IMPRESSION: 1. No acute abnormalities. 2. Chronic appendix steatosis. No significant change in cystic lesions in both kidneys. Electronically Signed   By: Lorriane Shire M.D.   On: 08/15/2017 17:41    ENDOSCOPIC STUDIES: EGD and Colonoscopy to be performed on 08/16/17 at approx 11 AM.  IMPRESSION:   Active problems:   AKI/UTI- Likely d/t CKD, dehydration and UTI. Pt is on IV Rocephin, followed by pharmacy. Urine culture pending. She is on IV fluids and I&Os being monitored. Scr today is 2.03, yesterday was 1.54. GFR today is 30, yday w 16.     GERD/Abdominal Pain/N/V/D: Possible etiologies include gastroparesis, SBO, celiac dz or other underlying malabsorptive dz/mass/malignancy. She is currently on Zofran prn, colace BID, and Protonix BID. Pt to receive inpatient EGD today with Dr. Havery Moros. She complained of dysphagia yesterday but did not report any issues with it today. CT of abd/pelvis and CT head WNL.    Anemia- not critical, her Hgb and Hct yesterday was 12.2 and 37.3 (wnl), today is slightly decreased at 11.2 Hgb and 35.1 Hct. Will assess for any underlying acute blood loss during endoscopy procedures today. Could be d/t CKD with AKI or dehydration, will monitor.   DM type 2: Not on any home meds, continue sliding scale insulin while inpatient, CBGs being monitored- most recent 140.   HTN: Continue amlodipine and atenolol   HLD: Continue rosuvastatin   Hypokalemia- Potassium was  3.1 today, she is not having any sxs at the moment. Hospitalist to follow, can order potassium po after endoscopy.   Suicidal ideations- Pt has recent hx of suicidal ideations and visual hallucinations for last 2 weeks. She attempted suicide with gabapentin few weeks ago. Continue meds- clonazepam, duloxetine, hydroxyzine.    PLAN:     Continue monitoring BMP for kidney function and potassium level.   Continue monitoring CBC to assess anemia.  Continue checking CBGs  Can d/c NPO after endoscopy.   Posey Pronto  08/16/2017, 9:35 AM Pager: [phone]

## 2017-08-16 NOTE — Op Note (Signed)
[facility] Patient Name: Yesenia [name] Procedure Date : 08/16/2017 MRN: [id number] Attending MD: Carlota Raspberry. Heiley Shaikh MD, MD Date of Birth: April 30, 1961 CSN: [id number] Age: 57 Admit Type: Inpatient Procedure:                Upper GI endoscopy Indications:              Epigastric abdominal pain, Nausea with vomiting,                            history of periodic NSAID use, prescribed omeprazole Providers:                Carlota Raspberry. Jahaziel Francois MD, MD, Cleda Daub, RN,                            Alan Mulder, Technician Referring MD:              Medicines:                Monitored Anesthesia Care Complications:            No immediate complications. Estimated blood loss:                            Minimal. Estimated Blood Loss:     Estimated blood loss was minimal. Procedure:                Pre-Anesthesia Assessment:                           - Prior to the procedure, a History and Physical                            was performed, and patient medications and                            allergies were reviewed. The patient's tolerance of                            previous anesthesia was also reviewed. The risks                            and benefits of the procedure and the sedation                            options and risks were discussed with the patient.                            All questions were answered, and informed consent                            was obtained. Prior Anticoagulants: The patient has                            taken no previous anticoagulant or antiplatelet  agents. ASA Grade Assessment: III - A patient with                            severe systemic disease. After reviewing the risks                            and benefits, the patient was deemed in                            satisfactory condition to undergo the procedure.                           After obtaining informed consent, the endoscope was              passed under direct vision. Throughout the                            procedure, the patient's blood pressure, pulse, and                            oxygen saturations were monitored continuously. The                            EG-[id number] ([id number]) scope was introduced through the                            mouth, and advanced to the second part of duodenum.                            The upper GI endoscopy was accomplished without                            difficulty. The patient tolerated the procedure                            well. Scope In: Scope Out: Findings:      Esophagogastric landmarks were identified: the Z-line was found at 37       cm, the gastroesophageal junction was found at 37 cm and the upper       extent of the gastric folds was found at 37 cm from the incisors.      The exam of the esophagus was otherwise normal.      One non-bleeding clean based superficial gastric ulcer with no stigmata       of bleeding was found in the gastric antrum. The lesion was 3-4 mm in       largest dimension.      Patchy moderate inflammation characterized by erythema and granularity       was found in the gastric antrum. Biopsies were taken with a cold forceps       for Helicobacter pylori testing.      The exam of the stomach was otherwise normal.      A few non-bleeding clean based superficial duodenal ulcers with no       stigmata of bleeding were found in the duodenal bulb, largest size 3-4  mm or so.      The exam of the duodenum was otherwise normal. Impression:               - Esophagogastric landmarks identified.                           - Normal esophagus.                           - Non-bleeding gastric ulcer with no stigmata of                            bleeding.                           - Gastritis. Biopsied.                           - Multiple non-bleeding duodenal ulcers with no                            stigmata of bleeding.                            Patient has PUD and suspected H pylori, biopsies                            obtained. Given her history of DM, component of                            gastroparesis is also possible. Moderate Sedation:      No moderate sedation, case performed with MAC Recommendation:           - Return patient to hospital ward for ongoing care.                           - Advance diet as tolerated.                           - Continue present medications.                           - IV protonix if patient is not tolerating PO                           - Add IV phenergan if no contraindications (zofran                            has not worked well)                           - May consider empirically adding Reglan if                            symptoms persist, versus gastric emptying study                            (  I'm concerned she won't tolerate the food for                            gastric emptying study right now)                           - Await pathology results.                           - GI service will continue to follow Procedure Code(s):        --- Professional ---                           [phone], Esophagogastroduodenoscopy, flexible,                            transoral; with biopsy, single or multiple Diagnosis Code(s):        --- Professional ---                           K25.9, Gastric ulcer, unspecified as acute or                            chronic, without hemorrhage or perforation                           K29.70, Gastritis, unspecified, without bleeding                           K26.9, Duodenal ulcer, unspecified as acute or                            chronic, without hemorrhage or perforation                           R10.13, Epigastric pain                           R11.2, Nausea with vomiting, unspecified CPT copyright 2016 [employer]. All rights reserved. The codes documented in this report are preliminary and upon coder review may  be revised to meet  current compliance requirements. Remo Lipps P. Tazaria Dlugosz MD, MD 08/16/2017 12:30:55 PM This report has been signed electronically. Number of Addenda: 0

## 2017-08-17 ENCOUNTER — Inpatient Hospital Stay (HOSPITAL_COMMUNITY): Payer: Medicaid Other

## 2017-08-17 ENCOUNTER — Other Ambulatory Visit: Payer: Self-pay

## 2017-08-17 ENCOUNTER — Encounter (HOSPITAL_COMMUNITY): Payer: Self-pay | Admitting: Gastroenterology

## 2017-08-17 DIAGNOSIS — K259 Gastric ulcer, unspecified as acute or chronic, without hemorrhage or perforation: Secondary | ICD-10-CM

## 2017-08-17 DIAGNOSIS — K269 Duodenal ulcer, unspecified as acute or chronic, without hemorrhage or perforation: Secondary | ICD-10-CM

## 2017-08-17 LAB — FOLATE RBC
FOLATE, HEMOLYSATE: 326.9 ng/mL
FOLATE, RBC: 884 ng/mL (ref >498)
HEMATOCRIT: 37 % (ref 34.0–46.6)

## 2017-08-17 LAB — BASIC METABOLIC PANEL
Anion gap: 12 (ref 5–15)
BUN: 14 mg/dL (ref 6–20)
CHLORIDE: 107 mmol/L (ref 101–111)
CO2: 22 mmol/L (ref 22–32)
Calcium: 8.3 mg/dL — ABNORMAL LOW (ref 8.9–10.3)
Creatinine, Ser: 1.52 mg/dL — ABNORMAL HIGH (ref 0.44–1.00)
GFR calc Af Amer: 43 mL/min — ABNORMAL LOW (ref >60)
GFR calc non Af Amer: 37 mL/min — ABNORMAL LOW (ref >60)
Glucose, Bld: 120 mg/dL — ABNORMAL HIGH (ref 65–99)
Potassium: 3.1 mmol/L — ABNORMAL LOW (ref 3.5–5.1)
SODIUM: 141 mmol/L (ref 135–145)

## 2017-08-17 LAB — CBC
HCT: 30.3 % — ABNORMAL LOW (ref 36.0–46.0)
HEMOGLOBIN: 9.8 g/dL — AB (ref 12.0–15.0)
MCH: 26.8 pg (ref 26.0–34.0)
MCHC: 32.3 g/dL (ref 30.0–36.0)
MCV: 83 fL (ref 78.0–100.0)
Platelets: 291 10*3/uL (ref 150–400)
RBC: 3.65 MIL/uL — ABNORMAL LOW (ref 3.87–5.11)
RDW: 14 % (ref 11.5–15.5)
WBC: 6.2 10*3/uL (ref 4.0–10.5)

## 2017-08-17 LAB — GLUCOSE, CAPILLARY
Glucose-Capillary: 115 mg/dL — ABNORMAL HIGH (ref 65–99)
Glucose-Capillary: 115 mg/dL — ABNORMAL HIGH (ref 65–99)
Glucose-Capillary: 118 mg/dL — ABNORMAL HIGH (ref 65–99)
Glucose-Capillary: 128 mg/dL — ABNORMAL HIGH (ref 65–99)
Glucose-Capillary: 156 mg/dL — ABNORMAL HIGH (ref 65–99)

## 2017-08-17 LAB — MRSA PCR SCREENING: MRSA by PCR: NEGATIVE

## 2017-08-17 MED ORDER — TECHNETIUM TC 99M TETROFOSMIN IV KIT: 10.0000 | PACK | Freq: Once | INTRAVENOUS | Status: DC | PRN

## 2017-08-17 MED ORDER — ENSURE ENLIVE PO LIQD: 237.0000 mL | Freq: Two times a day (BID) | ORAL | Status: DC

## 2017-08-17 MED ORDER — PROMETHAZINE HCL 25 MG PO TABS
12.5000 mg | ORAL_TABLET | Freq: Four times a day (QID) | ORAL | Status: DC
  Administered 2017-08-17 – 2017-08-22 (×21): 12.5 mg via ORAL
  Filled 2017-08-17 (×21): qty 1

## 2017-08-17 MED ORDER — ORAL CARE MOUTH RINSE
15.0000 mL | Freq: Two times a day (BID) | OROMUCOSAL | Status: DC
  Administered 2017-08-17 – 2017-08-21 (×6): 15 mL via OROMUCOSAL

## 2017-08-17 NOTE — Progress Notes (Signed)
Daily Rounding Note  08/17/2017, 1:08 PM  LOS: 2 days   SUBJECTIVE:   Chief complaint: Abdominal pain, N/V/D    She was started on IV protonix and phenergan yesterday- responding well to phenergan, no more nausea. Still having abdominal pain.  EGD 2/5 showed 1 non-bleeding ulcer in the gastric antrum, multiple non-bleeding ulcers in the duodenal bulb. Evidence of gastritis noted, biopsies taken to test for H.Pylori- results pending.   Anemia- Hgb 9.8 today, decreased from 11.2 yesterday. No upper GI bleed noted on EGD yday.   AKI/UTI- complains of new pain on her R flank that started today. Scr trending down from 2.03 to 1.52 today. GFR improving from 30 to 43 today.    Gastric emptying study 2/6- normal, pt tolerated well.   OBJECTIVE:         Vital signs in last 24 hours:    Temp:  [98.1 F (36.7 C)-98.4 F (36.9 C)] 98.4 F (36.9 C) (02/06 0409) Pulse Rate:  [58-78] 78 (02/06 1159) Resp:  [16-18] 17 (02/06 1159) BP: (90-101)/(57-66) 101/66 (02/06 1159) SpO2:  [93 %-98 %] 93 % (02/06 0409) Last BM Date: (PTA) Filed Weights   08/15/17 1552 08/16/17 1050  Weight: (!) 141.5 kg (312 lb) (!) 141.5 kg (312 lb)   General: appears tired, otherwise in no acute distress Heart: RRR, no mgr Chest: CTAB Abdomen: Soft, tenderness and guarding noted on palpation of LLQ, epigastric, and suprapubic region. CVAT noted on R flank.  Extremities: No significant LE edema. Pulses intact. Neuro/Psych:  AOx3  Intake/Output from previous day: 02/05 0701 - 02/06 0700 In: 200 [I.V.:200] Out: 450 [Urine:450]  Intake/Output this shift: Total I/O In: 120 [P.O.:120] Out: 300 [Urine:300]  Lab Results: Recent Labs    08/15/17 1600 08/16/17 0350 08/17/17 0453  WBC 8.0 10.2 6.2  HGB 12.2 11.2* 9.8*  HCT 37.3 35.1* 30.3*  PLT 374 329 291   BMET Recent Labs    08/15/17 1600 08/16/17 0350 08/17/17 0453  NA 142 142 141  K  3.4* 3.1* 3.1*  CL 107 105 107  CO2 21* 23 22  GLUCOSE 140* 131* 120*  BUN 13 17 14   CREATININE 1.54* 2.03* 1.52*  CALCIUM 9.0 8.7* 8.3*   LFT Recent Labs    08/15/17 1600 08/16/17 0350  PROT 7.6 6.8  ALBUMIN 3.9 3.5  AST 21 17  ALT 21 19  ALKPHOS 111 100  BILITOT 0.5 0.6   PT/INR Recent Labs    08/16/17 0350  LABPROT 15.1  INR 1.20   Hepatitis Panel No results for input(s): HEPBSAG, HCVAB, HEPAIGM, HEPBIGM in the last 72 hours.  Studies/Results: Dg Chest 2 View  Result Date: 08/15/2017 CLINICAL DATA:  Subacute onset of generalized abdominal pain, chest pain and dizziness. EXAM: CHEST  2 VIEW COMPARISON:  Chest radiograph performed 03/19/2017 FINDINGS: The lungs are well-aerated. Minimal right basilar atelectasis is noted. There is no evidence of pleural effusion or pneumothorax. The heart is borderline normal in size. No acute osseous abnormalities are seen. IMPRESSION: Minimal right basilar atelectasis noted.  Lungs otherwise clear. Electronically Signed   By: Roanna Raider M.D.   On: 08/15/2017 21:45   Ct Head Wo Contrast  Result Date: 08/15/2017 CLINICAL DATA:  Acute onset of severe left-sided headache, nausea and vomiting. EXAM: CT HEAD WITHOUT CONTRAST TECHNIQUE: Contiguous axial images were obtained from the base of the skull through the vertex without intravenous contrast. COMPARISON:  CT of the head  performed 09/24/2014, and MRI of the brain performed 04/13/2016 FINDINGS: Brain: No evidence of acute infarction, hemorrhage, hydrocephalus, extra-axial collection or mass lesion/mass effect. Scattered periventricular and subcortical white matter change likely reflects small vessel ischemic microangiopathy, similar in appearance to 2016. The posterior fossa, including the cerebellum, brainstem and fourth ventricle, is within normal limits. The third and lateral ventricles, and basal ganglia are unremarkable in appearance. No mass effect or midline shift is seen. Vascular: No  hyperdense vessel or unexpected calcification. Mildly increased attenuation within the vasculature is thought to be artifactual in nature. Skull: There is no evidence of fracture; visualized osseous structures are unremarkable in appearance. Sinuses/Orbits: The visualized portions of the orbits are within normal limits. The paranasal sinuses and mastoid air cells are well-aerated. Other: No significant soft tissue abnormalities are seen. IMPRESSION: 1. No acute intracranial pathology seen on CT. 2. Scattered small vessel ischemic microangiopathy. Electronically Signed   By: Roanna RaiderJeffery  Chang M.D.   On: 08/15/2017 21:42   Nm Gastric Emptying  Result Date: 08/17/2017 CLINICAL DATA:  Diabetes.  Dysphagia. EXAM: NUCLEAR MEDICINE GASTRIC EMPTYING SCAN TECHNIQUE: After oral ingestion of radiolabeled meal, sequential abdominal images were obtained for 3 hours. Percentage of activity emptying the stomach was calculated at 1 hour, 2 hour, and 3 hr. RADIOPHARMACEUTICALS:  2.0 mCi Tc-541m sulfur colloid in standardized meal COMPARISON:  None. FINDINGS: Expected location of the stomach in the left upper quadrant. Ingested meal empties the stomach gradually over the course of the study. 44% emptied at 1 hr ( normal >= 10%) 69% emptied at 2 hr ( normal >= 40%) 95% emptied at 3 hr ( normal >= 70%) IMPRESSION: Normal gastric emptying study. Electronically Signed   By: Charlett NoseKevin  Dover M.D.   On: 08/17/2017 12:41   [place] Soft Tissue Head & Neck (non-thyroid)  Result Date: 08/15/2017 CLINICAL DATA:  Dysphagia, history of tracheostomy removal EXAM: ULTRASOUND OF HEAD/NECK SOFT TISSUES TECHNIQUE: Ultrasound examination of the head and neck soft tissues was performed in the area of clinical concern. COMPARISON:  CT 02/06/2017 FINDINGS: Targeted ultrasound of the neck in the region of tracheostomy scar is performed. No cyst or solid mass is visualized. IMPRESSION: No cyst/fluid collection or solid mass to correspond to palpable area near the  tracheostomy scar. Electronically Signed   By: Jasmine PangKim  Fujinaga M.D.   On: 08/15/2017 23:07   Ct Abdomen Pelvis W Contrast  Result Date: 08/15/2017 CLINICAL DATA:  Abdominal pain and vomiting and diarrhea. EXAM: CT ABDOMEN AND PELVIS WITH CONTRAST TECHNIQUE: Multidetector CT imaging of the abdomen and pelvis was performed using the standard protocol following bolus administration of intravenous contrast. CONTRAST:  75mL ISOVUE-300 IOPAMIDOL (ISOVUE-300) INJECTION 61% COMPARISON:  CT scan dated 04/11/2013 FINDINGS: Lower chest: Normal. Hepatobiliary: Hepatic steatosis. No focal lesions. Biliary tree is normal. Pancreas: Unremarkable. No pancreatic ductal dilatation or surrounding inflammatory changes. Spleen: Normal in size without focal abnormality. Adrenals/Urinary Tract: Adrenal glands are normal. 14 mm cyst in the lower pole of the left kidney, slightly increased in size since 2014. 4 cm slightly complex cyst in the lower pole of the right kidney also slightly increased in size, but otherwise unchanged. No hydronephrosis. Bladder is empty. Stomach/Bowel: Stomach is within normal limits. Appendix appears normal. No evidence of bowel wall thickening, distention, or inflammatory changes. Vascular/Lymphatic: No significant vascular findings are present. No enlarged abdominal or pelvic lymph nodes. Reproductive: Uterus and bilateral adnexa are unremarkable. Other: No abdominal wall hernia or abnormality. No abdominopelvic ascites. Musculoskeletal: No acute abnormality. Degenerative  disc disease and joint disease in the lower lumbar spine. IMPRESSION: 1. No acute abnormalities. 2. Chronic appendix steatosis. No significant change in cystic lesions in both kidneys. Electronically Signed   By: Francene Boyers M.D.   On: 08/15/2017 17:41    ASSESMENT:    AKI/UTI- no dysuria or change in frequency although she still has suprapubic tenderness and a newly found R flank pain. Concerned about possible pyelonephritis.  Request hospitalist to follow. Urine culture 2/6 pending.    GERD/Abdominal pain/N/V/D- started on IV protonix, phenergan 2/5. Tolerating well, no more nausea. Normal gastric emptying study.   Anemia- Will repeat CBC tomorrow. Could be d/t dehydration, will monitor.   Hypokalemia- Stable at 3.1, will monitor   DM2, HTN, HLD- managed by hospitalist, doing well on SSI, amlodipine, atenolol, rosuvastatin.   Suicidal ideations- pt on suicide watch with sitter.   PLAN   Will order [place] of abdomen to assess for gallstones. H.pylori bx still pending.   Continue protonix, phenergan.  Repeat CBC and BMP tomorrow AM to monitor kidney fxn and anemia.  Request Hospitalist to follow up on CVAT and urine culture (pending). May be worthwhile to start pt on abx again in the meantime.   Dayyan Krist  08/17/2017, 1:08 PM Pager: [phone]

## 2017-08-17 NOTE — Progress Notes (Signed)
[facility]                                                                              Patient Demographics  Yesenia [name], is a 57 y.o. female, DOB - [birth year], ZOX:[id number]  Admit date - 08/15/2017   Admitting Physician Ankit Joline Maxcy, MD  Outpatient Primary MD for the patient is Marva Panda, NP  Outpatient specialists:   LOS - 2  days   Medical records reviewed and are as summarized below:    Chief Complaint  Patient presents with  . Abdominal Pain       Brief summary   Patient is a 57 year old female with CKD stage III, diabetes mellitus type 2, hypertension, hyperlipidemia, GERD presented with complaints of dysphagia.  Patient reported dysphagia to solid food for several weeks but over the course of the past 2 weeks this had progressed to both solids and liquids.  She had not been able to keep any food down.  Patient reported no prior history of dysphagia and no history of endoscopy in the past.  Patient was admitted for further workup.    Assessment & Plan    Principal Problem:   Dysphagia - GI consulted, underwent EGD, no stricture or mass, recommended possible gastroparesis - CT of the abdomen and pelvis showed no acute intra-abdominal abnormalities -Gastric emptying study normal, ? Globus versus functional -Continue PPI, placed on full liquid diet, advance to soft solids in a.m.   Active Problems:  UTI -continue IV Rocephin, follow sensitivities  Dizziness, dehydration -Continue IV fluids  Acute kidney injury on CKD stage II-III -Baseline creatinine 1.0 -Continue IV fluid hydration, creatinine improving  Suicidal ideation -Continue sitter - Psychiatry consulted, recommended patient to be discharged to inpatient behavioral health once she is medically ready - Discussed in detail with the patient, she is voluntarily ready to go to [facility]   Diabetes mellitus -Metformin on hold, continue sliding scale insulin  Obstructive  sleep apnea -Continue supportive care, patient used to have tracheostomy for this, now out since November 2018  Code Status: Full CODE STATUS DVT Prophylaxis: SCD Family Communication: Discussed in detail with the patient, all imaging results, lab results explained to the patient    Disposition Plan: DC to [facility] once tolerating solid diet possibly in a.m.  Time Spent in minutes 25 minutes  Procedures:  EGD Gastric emptying study  Consultants:   GI Psychiatry  Antimicrobials:      Medications  Scheduled Meds: . amLODipine  10 mg Oral Daily  . atenolol  50 mg Oral Daily  . busPIRone  15 mg Oral BID  . clonazePAM  1 mg Oral QHS  . docusate sodium  100 mg Oral BID  . DULoxetine  30 mg Oral Daily  . gabapentin  900 mg Oral QHS  . hydrOXYzine  50 mg Oral QHS  . insulin aspart  0-5 Units Subcutaneous QHS  . insulin aspart  0-9 Units Subcutaneous Q6H  . pantoprazole (PROTONIX) IV  40 mg Intravenous Q12H  . promethazine  12.5 mg Intravenous Q6H  . rosuvastatin  10 mg Oral Daily   Continuous Infusions: . sodium chloride 75  mL/hr at 08/17/17 0407  . cefTRIAXone (ROCEPHIN)  IV Stopped (08/16/17 2201)  . dextrose 5 % and 0.45% NaCl 75 mL/hr at 08/16/17 0054   PRN Meds:.acetaminophen **OR** acetaminophen, albuterol, hyoscyamine   Antibiotics   Anti-infectives (From admission, onward)   Start     Dose/Rate Route Frequency Ordered Stop   08/16/17 2200  cefTRIAXone (ROCEPHIN) 1 g in dextrose 5 % 50 mL IVPB     1 g 100 mL/hr over 30 Minutes Intravenous Every 24 hours 08/15/17 2240     08/15/17 2230  cefTRIAXone (ROCEPHIN) 1 g in dextrose 5 % 50 mL IVPB     1 g 100 mL/hr over 30 Minutes Intravenous  Once 08/15/17 2215 08/16/17 0259        Subjective:   Yesenia [name] was seen and examined today. Denies any specific complaints, feeling that that she will be able to tolerate advanced diet. Sitter at the bedside.   Patient denies dizziness, chest pain, shortness of breath,  abdominal pain, N/V/D/C, new weakness, numbess, tingling.  No fevers.  Objective:   Vitals:   08/16/17 1337 08/16/17 2120 08/17/17 0409 08/17/17 1159  BP: (!) 94/57 (!) 99/58 90/60 101/66  Pulse: (!) 58 65 75 78  Resp: 16 18 18 17   Temp: 98.1 F (36.7 C) 98.4 F (36.9 C) 98.4 F (36.9 C)   TempSrc: Oral Oral Oral   SpO2: 98% 96% 93%   Weight:      Height:        Intake/Output Summary (Last 24 hours) at 08/17/2017 1318 Last data filed at 08/17/2017 1115 Gross per 24 hour  Intake 120 ml  Output 500 ml  Net -380 ml     Wt Readings from Last 3 Encounters:  08/16/17 (!) 141.5 kg (312 lb)  05/12/17 (!) 145.7 kg (321 lb 4 oz)  04/27/17 (!) 146.5 kg (323 lb)     Exam   General: Alert and oriented x 3, NAD  Eyes:   HEENT:    Cardiovascular: S1 S2 auscultated, Regular rate and rhythm. No pedal edema b/l  Respiratory: Clear to auscultation bilaterally, no wheezing, rales or rhonchi  Gastrointestinal: Soft, nontender, nondistended, + bowel sounds  Ext: no pedal edema bilaterally  Neuro: no new deficits   Musculoskeletal: No digital cyanosis, clubbing  Skin: No rashes  Psych: Normal affect and demeanor, alert and oriented x3    Data Reviewed:  I have personally reviewed following labs and imaging studies  Micro Results Recent Results (from the past 240 hour(s))  MRSA PCR Screening     Status: None   Collection Time: 08/17/17  2:44 AM  Result Value Ref Range Status   MRSA by PCR NEGATIVE NEGATIVE Final    Comment:        The GeneXpert MRSA Assay (FDA approved for NASAL specimens only), is one component of a comprehensive MRSA colonization surveillance program. It is not intended to diagnose MRSA infection nor to guide or monitor treatment for MRSA infections. Performed at [facility], [address] [place], [place] [address]     Radiology Reports Dg Chest 2 View  Result Date: 08/15/2017 CLINICAL DATA:  Subacute onset of generalized abdominal  pain, chest pain and dizziness. EXAM: CHEST  2 VIEW COMPARISON:  Chest radiograph performed 03/19/2017 FINDINGS: The lungs are well-aerated. Minimal right basilar atelectasis is noted. There is no evidence of pleural effusion or pneumothorax. The heart is borderline normal in size. No acute osseous abnormalities are seen. IMPRESSION: Minimal right  basilar atelectasis noted.  Lungs otherwise clear. Electronically Signed   By: Roanna Raider M.D.   On: 08/15/2017 21:45   Ct Head Wo Contrast  Result Date: 08/15/2017 CLINICAL DATA:  Acute onset of severe left-sided headache, nausea and vomiting. EXAM: CT HEAD WITHOUT CONTRAST TECHNIQUE: Contiguous axial images were obtained from the base of the skull through the vertex without intravenous contrast. COMPARISON:  CT of the head performed 09/24/2014, and MRI of the brain performed 04/13/2016 FINDINGS: Brain: No evidence of acute infarction, hemorrhage, hydrocephalus, extra-axial collection or mass lesion/mass effect. Scattered periventricular and subcortical white matter change likely reflects small vessel ischemic microangiopathy, similar in appearance to 2016. The posterior fossa, including the cerebellum, brainstem and fourth ventricle, is within normal limits. The third and lateral ventricles, and basal ganglia are unremarkable in appearance. No mass effect or midline shift is seen. Vascular: No hyperdense vessel or unexpected calcification. Mildly increased attenuation within the vasculature is thought to be artifactual in nature. Skull: There is no evidence of fracture; visualized osseous structures are unremarkable in appearance. Sinuses/Orbits: The visualized portions of the orbits are within normal limits. The paranasal sinuses and mastoid air cells are well-aerated. Other: No significant soft tissue abnormalities are seen. IMPRESSION: 1. No acute intracranial pathology seen on CT. 2. Scattered small vessel ischemic microangiopathy. Electronically Signed   By:  Roanna Raider M.D.   On: 08/15/2017 21:42   Nm Gastric Emptying  Result Date: 08/17/2017 CLINICAL DATA:  Diabetes.  Dysphagia. EXAM: NUCLEAR MEDICINE GASTRIC EMPTYING SCAN TECHNIQUE: After oral ingestion of radiolabeled meal, sequential abdominal images were obtained for 3 hours. Percentage of activity emptying the stomach was calculated at 1 hour, 2 hour, and 3 hr. RADIOPHARMACEUTICALS:  2.0 mCi Tc-67m sulfur colloid in standardized meal COMPARISON:  None. FINDINGS: Expected location of the stomach in the left upper quadrant. Ingested meal empties the stomach gradually over the course of the study. 44% emptied at 1 hr ( normal >= 10%) 69% emptied at 2 hr ( normal >= 40%) 95% emptied at 3 hr ( normal >= 70%) IMPRESSION: Normal gastric emptying study. Electronically Signed   By: Charlett Nose M.D.   On: 08/17/2017 12:41   [place] Soft Tissue Head & Neck (non-thyroid)  Result Date: 08/15/2017 CLINICAL DATA:  Dysphagia, history of tracheostomy removal EXAM: ULTRASOUND OF HEAD/NECK SOFT TISSUES TECHNIQUE: Ultrasound examination of the head and neck soft tissues was performed in the area of clinical concern. COMPARISON:  CT 02/06/2017 FINDINGS: Targeted ultrasound of the neck in the region of tracheostomy scar is performed. No cyst or solid mass is visualized. IMPRESSION: No cyst/fluid collection or solid mass to correspond to palpable area near the tracheostomy scar. Electronically Signed   By: Jasmine Pang M.D.   On: 08/15/2017 23:07   Ct Abdomen Pelvis W Contrast  Result Date: 08/15/2017 CLINICAL DATA:  Abdominal pain and vomiting and diarrhea. EXAM: CT ABDOMEN AND PELVIS WITH CONTRAST TECHNIQUE: Multidetector CT imaging of the abdomen and pelvis was performed using the standard protocol following bolus administration of intravenous contrast. CONTRAST:  75mL ISOVUE-300 IOPAMIDOL (ISOVUE-300) INJECTION 61% COMPARISON:  CT scan dated 04/11/2013 FINDINGS: Lower chest: Normal. Hepatobiliary: Hepatic steatosis. No  focal lesions. Biliary tree is normal. Pancreas: Unremarkable. No pancreatic ductal dilatation or surrounding inflammatory changes. Spleen: Normal in size without focal abnormality. Adrenals/Urinary Tract: Adrenal glands are normal. 14 mm cyst in the lower pole of the left kidney, slightly increased in size since 2014. 4 cm slightly complex cyst in the lower pole  of the right kidney also slightly increased in size, but otherwise unchanged. No hydronephrosis. Bladder is empty. Stomach/Bowel: Stomach is within normal limits. Appendix appears normal. No evidence of bowel wall thickening, distention, or inflammatory changes. Vascular/Lymphatic: No significant vascular findings are present. No enlarged abdominal or pelvic lymph nodes. Reproductive: Uterus and bilateral adnexa are unremarkable. Other: No abdominal wall hernia or abnormality. No abdominopelvic ascites. Musculoskeletal: No acute abnormality. 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 IMPRESSION: 1. No acute abnormalities. 2. Chronic appendix steatosis. No significant change in cystic lesions in both kidneys. Electronically Signed   By: Francene BoyersJames  Maxwell M.D.   On: 08/15/2017 17:41    Lab Data:  CBC: Recent Labs  Lab 08/15/17 1600 08/16/17 0350 08/17/17 0453  WBC 8.0 10.2 6.2  NEUTROABS 3.9  --   --   HGB 12.2 11.2* 9.8*  HCT 37.3 35.1* 30.3*  MCV 82.0 83.6 83.0  PLT 374 329 291   Basic Metabolic Panel: Recent Labs  Lab 08/15/17 1600 08/16/17 0350 08/17/17 0453  NA 142 142 141  K 3.4* 3.1* 3.1*  CL 107 105 107  CO2 21* 23 22  GLUCOSE 140* 131* 120*  BUN 13 17 14   CREATININE 1.54* 2.03* 1.52*  CALCIUM 9.0 8.7* 8.3*   GFR: Estimated Creatinine Clearance: 61.1 mL/min (A) (by C-G formula based on SCr of 1.52 mg/dL (H)). Liver Function Tests: Recent Labs  Lab 08/15/17 1600 08/16/17 0350  AST 21 17  ALT 21 19  ALKPHOS 111 100  BILITOT 0.5 0.6  PROT 7.6 6.8  ALBUMIN 3.9 3.5   Recent Labs  Lab  08/15/17 1600  LIPASE 40   Recent Labs  Lab 08/16/17 0011  AMMONIA 26   Coagulation Profile: Recent Labs  Lab 08/16/17 0350  INR 1.20   Cardiac Enzymes: No results for input(s): CKTOTAL, CKMB, CKMBINDEX, TROPONINI in the last 168 hours. BNP (last 3 results) No results for input(s): PROBNP in the last 8760 hours. HbA1C: No results for input(s): HGBA1C in the last 72 hours. CBG: Recent Labs  Lab 08/16/17 1858 08/16/17 2133 08/17/17 0223 08/17/17 0524 08/17/17 1153  GLUCAP 194* 108* 115* 115* 156*   Lipid Profile: No results for input(s): CHOL, HDL, LDLCALC, TRIG, CHOLHDL, LDLDIRECT in the last 72 hours. Thyroid Function Tests: Recent Labs    08/16/17 0011  TSH 1.193   Anemia Panel: Recent Labs    08/16/17 0011  VITAMINB12 474   Urine analysis:    Component Value Date/Time   COLORURINE YELLOW 08/15/2017 1647   APPEARANCEUR CLOUDY (A) 08/15/2017 1647   LABSPEC 1.017 08/15/2017 1647   PHURINE 5.0 08/15/2017 1647   GLUCOSEU NEGATIVE 08/15/2017 1647   HGBUR SMALL (A) 08/15/2017 1647   BILIRUBINUR NEGATIVE 08/15/2017 1647   KETONESUR NEGATIVE 08/15/2017 1647   PROTEINUR 30 (A) 08/15/2017 1647   UROBILINOGEN 0.2 06/16/2014 0955   NITRITE POSITIVE (A) 08/15/2017 1647   LEUKOCYTESUR LARGE (A) 08/15/2017 1647     Kohen Reither M.D. [facility] 08/17/2017, 1:18 PM  Pager: [phone] Between 7am to 7pm - call Pager - [phone]  After 7pm go to [email or web address] - password [email or web address]  Call night coverage person covering after 7pm

## 2017-08-18 ENCOUNTER — Inpatient Hospital Stay (HOSPITAL_COMMUNITY): Payer: Medicaid Other

## 2017-08-18 DIAGNOSIS — N179 Acute kidney failure, unspecified: Principal | ICD-10-CM

## 2017-08-18 DIAGNOSIS — R197 Diarrhea, unspecified: Secondary | ICD-10-CM

## 2017-08-18 DIAGNOSIS — E876 Hypokalemia: Secondary | ICD-10-CM

## 2017-08-18 DIAGNOSIS — K219 Gastro-esophageal reflux disease without esophagitis: Secondary | ICD-10-CM

## 2017-08-18 LAB — URINE CULTURE

## 2017-08-18 LAB — BASIC METABOLIC PANEL
ANION GAP: 12 (ref 5–15)
BUN: 11 mg/dL (ref 6–20)
CO2: 22 mmol/L (ref 22–32)
Calcium: 8.2 mg/dL — ABNORMAL LOW (ref 8.9–10.3)
Chloride: 111 mmol/L (ref 101–111)
Creatinine, Ser: 1.23 mg/dL — ABNORMAL HIGH (ref 0.44–1.00)
GFR calc Af Amer: 56 mL/min — ABNORMAL LOW (ref >60)
GFR, EST NON AFRICAN AMERICAN: 48 mL/min — AB (ref >60)
Glucose, Bld: 113 mg/dL — ABNORMAL HIGH (ref 65–99)
POTASSIUM: 3.4 mmol/L — AB (ref 3.5–5.1)
SODIUM: 145 mmol/L (ref 135–145)

## 2017-08-18 LAB — GLUCOSE, CAPILLARY
GLUCOSE-CAPILLARY: 151 mg/dL — AB (ref 65–99)
Glucose-Capillary: 95 mg/dL (ref 65–99)
Glucose-Capillary: 119 mg/dL — ABNORMAL HIGH (ref 65–99)

## 2017-08-18 LAB — CBC
HCT: 29.7 % — ABNORMAL LOW (ref 36.0–46.0)
Hemoglobin: 9.5 g/dL — ABNORMAL LOW (ref 12.0–15.0)
MCH: 26.4 pg (ref 26.0–34.0)
MCHC: 32 g/dL (ref 30.0–36.0)
MCV: 82.5 fL (ref 78.0–100.0)
PLATELETS: 311 10*3/uL (ref 150–400)
RBC: 3.6 MIL/uL — AB (ref 3.87–5.11)
RDW: 13.8 % (ref 11.5–15.5)
WBC: 8 10*3/uL (ref 4.0–10.5)

## 2017-08-18 NOTE — Progress Notes (Signed)
Daily Rounding Note  08/18/2017, 8:47 AM  LOS: 3 days   SUBJECTIVE:   Chief complaint: Abdominal pain- improving, still reports cramping after eating but not as severe  Started on IV Rocephin again yday by Hospitalist- tolerating well, still reports R sided flank/back pain, suspect it could be musculoskeletal.  US of GB normal  Advanced to full diet yesterday- pt tolerating well, no nausea, vomiting, or diarrhea. She has had two BMs since yesterday, both soft.  OBJECTIVE:         Vital signs in last 24 hours:    Temp:  [98 F (36.7 C)-98.4 F (36.9 C)] 98.4 F (36.9 C) (02/07 0603) Pulse Rate:  [58-78] 67 (02/07 0603) Resp:  [17-18] 18 (02/07 0603) BP: (95-103)/(47-66) 95/53 (02/07 0603) SpO2:  [100 %] 100 % (02/07 0603) Weight:  [143.4 kg (316 lb 1.6 oz)] 143.4 kg (316 lb 1.6 oz) (02/07 0603) Last BM Date: 08/16/17 Filed Weights   08/15/17 1552 08/16/17 1050 08/18/17 0603  Weight: (!) 141.5 kg (312 lb) (!) 141.5 kg (312 lb) (!) 143.4 kg (316 lb 1.6 oz)   General: WD obese female in NAD   Heart: RRR, no MGR Chest: CTAB Abdomen: Soft,  Extremities: No LE edema Neuro/Psych:  AOx3  Intake/Output from previous day: 02/06 0701 - 02/07 0700 In: 2216.3 [P.O.:400; I.V.:1716.3; IV Piggyback:100] Out: 1950 [Urine:1950]  Intake/Output this shift: No intake/output data recorded.  Lab Results: Recent Labs    08/16/17 0350 08/17/17 0453 08/18/17 0337  WBC 10.2 6.2 8.0  HGB 11.2* 9.8* 9.5*  HCT 35.1* 30.3* 29.7*  PLT 329 291 311   BMET Recent Labs    08/16/17 0350 08/17/17 0453 08/18/17 0337  NA 142 141 145  K 3.1* 3.1* 3.4*  CL 105 107 111  CO2 23 22 22   GLUCOSE 131* 120* 113*  BUN 17 14 11   CREATININE 2.03* 1.52* 1.23*  CALCIUM 8.7* 8.3* 8.2*   LFT Recent Labs    08/15/17 1600 08/16/17 0350  PROT 7.6 6.8  ALBUMIN 3.9 3.5  AST 21 17  ALT 21 19  ALKPHOS 111 100  BILITOT 0.5 0.6    PT/INR Recent Labs    08/16/17 0350  LABPROT 15.1  INR 1.20   Hepatitis Panel No results for input(s): HEPBSAG, HCVAB, HEPAIGM, HEPBIGM in the last 72 hours.  Studies/Results: Nm Gastric Emptying  Result Date: 08/17/2017 CLINICAL DATA:  Diabetes.  Dysphagia. EXAM: NUCLEAR MEDICINE GASTRIC EMPTYING SCAN TECHNIQUE: After oral ingestion of radiolabeled meal, sequential abdominal images were obtained for 3 hours. Percentage of activity emptying the stomach was calculated at 1 hour, 2 hour, and 3 hr. RADIOPHARMACEUTICALS:  2.0 mCi Tc-6890m sulfur colloid in standardized meal COMPARISON:  None. FINDINGS: Expected location of the stomach in the left upper quadrant. Ingested meal empties the stomach gradually over the course of the study. 44% emptied at 1 hr ( normal >= 10%) 69% emptied at 2 hr ( normal >= 40%) 95% emptied at 3 hr ( normal >= 70%) IMPRESSION: Normal gastric emptying study. Electronically Signed   By: Charlett NoseKevin  Dover M.D.   On: 08/17/2017 12:41   [place] Abdomen Limited Ruq  Result Date: 08/17/2017 CLINICAL DATA:  Epigastric pain. EXAM: ULTRASOUND ABDOMEN LIMITED RIGHT UPPER QUADRANT COMPARISON:  None. FINDINGS: Gallbladder: No gallstones or wall thickening visualized. No sonographic Murphy sign noted by sonographer. Common bile duct: Diameter: 5 mm Liver: Increased echogenicity with no focal mass. Portal vein is patent on  color Doppler imaging with normal direction of blood flow towards the liver. IMPRESSION: 1. The gallbladder is normal in appearance with no stones. 2. Probable hepatic steatosis. 3. Right renal cyst measuring 3.2 cm. Electronically Signed   By: Gerome Sam III M.D   On: 08/17/2017 18:56    ASSESMENT:    AKI/UTI: pt restarted on IV Rocephin 2/6. Kidney function continues to improve, sCr improved from 1.52 to 1.23 today. GFR up from 43 to 56 today. BUN nml. Urine cx- multiple species present, recommend repeating.  GERD/Abdominal pain- continue IV protonix and phenergan.  Nml gastric emptying study, [place], and CT abdomen. H.pylori negative. Pt sxs improving, no more N/V/D, mild post-prandial cramping which has also improved since hospital stay.  Anemia- Has been trending down from 11.2 --> 9.8 --> 9.5 today. Likely d/t dehydration, no evidence of other sources of blood loss.  Hypokalemia- Up from 3.1 to 3.4 today.  DM2, HTN, HLD- continue current regimen  Suicidal ideations- pt will be discharged to inpatient psych once medically stable, voluntary admission   PLAN   Repeat Urine cx per hospitalist.  Continue IV protonix and phenergan. Can continue full diet.   Can be cleared by GI given pt is not acutely symptomatic at this time and all appropriate studies have been negative. Recommend outpatient work-up, perhaps try gluten-free diet again since pt has had relief of these sxs in the past with it.  Can be discharged to inpatient psych once AKI/UTI has resolved, followed by hospitalist.  Floreen Comber  08/18/2017, 8:47 AM Pager: [phone]

## 2017-08-18 NOTE — Progress Notes (Addendum)
[facility]                                                                              Patient Demographics  Yesenia [name], is a 57 y.o. female, DOB - 1960-08-02, HYQ:[id number]  Admit date - 08/15/2017   Admitting Physician Ankit Joline Maxcy, MD  Outpatient Primary MD for the patient is Marva Panda, NP  Outpatient specialists:   LOS - 3  days   Medical records reviewed and are as summarized below:    Chief Complaint  Patient presents with  . Abdominal Pain       Brief summary   Patient is a 57 year old female with CKD stage III, diabetes mellitus type 2, hypertension, hyperlipidemia, GERD presented with complaints of dysphagia.  Patient reported dysphagia to solid food for several weeks but over the course of the past 2 weeks this had progressed to both solids and liquids.  She had not been able to keep any food down.  Patient reported no prior history of dysphagia and no history of endoscopy in the past.  Patient was admitted for further workup.    Assessment & Plan    Principal Problem:   Dysphagia - GI consulted, underwent EGD, no stricture or mass, recommended possible gastroparesis - CT of the abdomen and pelvis showed no acute intra-abdominal abnormalities -Gastric emptying study normal, ? Globus versus functional -Continue PPI, placed on full liquid diet, advance to soft solids in a.m.   Active Problems:  UTI -continue IV Rocephin, UCx multiple flora - 2/5--> present  Dizziness, dehydration -Continue IV fluids  Acute kidney injury on CKD stage II-III -Baseline creatinine 1.0 -Continue IV fluid hydration, creatinine improving - REnal U/S pending  Suicidal ideation -Continue sitter - Psychiatry consulted, recommended patient to be discharged to inpatient behavioral health once she is medically ready - Discussed in detail with the patient, she is voluntarily ready to go to [facility]   Diabetes mellitus -Metformin on hold, continue  sliding scale insulin  Obstructive sleep apnea -Continue supportive care, patient used to have tracheostomy for this, now out since November 2018  Code Status: Full CODE STATUS DVT Prophylaxis: SCD Family Communication: Discussed in detail with the patient, all imaging results, lab results explained to the patient    Disposition Plan: DC to [facility] once tolerating solid diet possibly in a.m.  Time Spent in minutes 25 minutes  Procedures:  EGD - gastritis, multiple non bleeding ulcers Gastric emptying study 2/6 normal  Consultants:   GI Psychiatry  Antimicrobials:      Medications  Scheduled Meds: . amLODipine  10 mg Oral Daily  . atenolol  50 mg Oral Daily  . busPIRone  15 mg Oral BID  . clonazePAM  1 mg Oral QHS  . docusate sodium  100 mg Oral BID  . DULoxetine  30 mg Oral Daily  . feeding supplement (ENSURE ENLIVE)  237 mL Oral BID BM  . gabapentin  900 mg Oral QHS  . hydrOXYzine  50 mg Oral QHS  . insulin aspart  0-5 Units Subcutaneous QHS  . insulin aspart  0-9 Units Subcutaneous Q6H  . mouth rinse  15  mL Mouth Rinse BID  . pantoprazole (PROTONIX) IV  40 mg Intravenous Q12H  . promethazine  12.5 mg Oral Q6H  . rosuvastatin  10 mg Oral Daily   Continuous Infusions: . sodium chloride 75 mL/hr at 08/18/17 0732  . cefTRIAXone (ROCEPHIN)  IV Stopped (08/17/17 2321)  . dextrose 5 % and 0.45% NaCl Stopped (08/17/17 1900)   PRN Meds:.acetaminophen **OR** acetaminophen, albuterol, hyoscyamine   Antibiotics   Anti-infectives (From admission, onward)   Start     Dose/Rate Route Frequency Ordered Stop   08/16/17 2200  cefTRIAXone (ROCEPHIN) 1 g in dextrose 5 % 50 mL IVPB     1 g 100 mL/hr over 30 Minutes Intravenous Every 24 hours 08/15/17 2240     08/15/17 2230  cefTRIAXone (ROCEPHIN) 1 g in dextrose 5 % 50 mL IVPB     1 g 100 mL/hr over 30 Minutes Intravenous  Once 08/15/17 2215 08/16/17 0259        Subjective:   Yesenia [name] was seen and examined today.  Denies any specific complaints. Sitter at the bedside.   Patient denies chest pain, shortness of breath, abdominal pain. No fevers.  No HI Some SI  Objective:   Vitals:   08/17/17 2147 08/18/17 0603 08/18/17 0928 08/18/17 1426  BP: (!) 95/47 (!) 95/53 123/74 105/60  Pulse: (!) 58 67 80 77  Resp: 18 18  18   Temp: 98 F (36.7 C) 98.4 F (36.9 C)  98.1 F (36.7 C)  TempSrc: Oral Oral  Oral  SpO2: 100% 100% 98% 99%  Weight:  (!) 143.4 kg (316 lb 1.6 oz)    Height:        Intake/Output Summary (Last 24 hours) at 08/18/2017 1439 Last data filed at 08/18/2017 1426 Gross per 24 hour  Intake 3121.25 ml  Output 1200 ml  Net 1921.25 ml     Wt Readings from Last 3 Encounters:  08/18/17 (!) 143.4 kg (316 lb 1.6 oz)  05/12/17 (!) 145.7 kg (321 lb 4 oz)  04/27/17 (!) 146.5 kg (323 lb)     Exam   General: Alert and oriented x 3, NAD  Eyes:   HEENT:    Cardiovascular: S1 S2 auscultated, Regular rate and rhythm. No pedal edema b/l  Respiratory: Clear to auscultation bilaterally, no wheezing, rales or rhonchi  Gastrointestinal: Soft, nontender, nondistended, + bowel sounds  Ext: no pedal edema bilaterally  Neuro: no new deficits   Musculoskeletal: No digital cyanosis, clubbing  Skin: No rashes  Psych: Normal affect and demeanor, alert and oriented x3    Data Reviewed:  I have personally reviewed following labs and imaging studies  Micro Results Recent Results (from the past 240 hour(s))  MRSA PCR Screening     Status: None   Collection Time: 08/17/17  2:44 AM  Result Value Ref Range Status   MRSA by PCR NEGATIVE NEGATIVE Final    Comment:        The GeneXpert MRSA Assay (FDA approved for NASAL specimens only), is one component of a comprehensive MRSA colonization surveillance program. It is not intended to diagnose MRSA infection nor to guide or monitor treatment for MRSA infections. Performed at [facility], [address], [place], [place]  [address]   Culture, Urine     Status: Abnormal   Collection Time: 08/17/17  5:29 AM  Result Value Ref Range Status   Specimen Description URINE, CLEAN CATCH  Final   Special Requests   Final  NONE Performed at [facility], [address] [place], [place] [address]    Culture MULTIPLE SPECIES PRESENT, SUGGEST RECOLLECTION (A)  Final   Report Status 08/18/2017 FINAL  Final    Radiology Reports Dg Chest 2 View  Result Date: 08/15/2017 CLINICAL DATA:  Subacute onset of generalized abdominal pain, chest pain and dizziness. EXAM: CHEST  2 VIEW COMPARISON:  Chest radiograph performed 03/19/2017 FINDINGS: The lungs are well-aerated. Minimal right basilar atelectasis is noted. There is no evidence of pleural effusion or pneumothorax. The heart is borderline normal in size. No acute osseous abnormalities are seen. IMPRESSION: Minimal right basilar atelectasis noted.  Lungs otherwise clear. Electronically Signed   By: Roanna RaiderJeffery  Chang M.D.   On: 08/15/2017 21:45   Ct Head Wo Contrast  Result Date: 08/15/2017 CLINICAL DATA:  Acute onset of severe left-sided headache, nausea and vomiting. EXAM: CT HEAD WITHOUT CONTRAST TECHNIQUE: Contiguous axial images were obtained from the base of the skull through the vertex without intravenous contrast. COMPARISON:  CT of the head performed 09/24/2014, and MRI of the brain performed 04/13/2016 FINDINGS: Brain: No evidence of acute infarction, hemorrhage, hydrocephalus, extra-axial collection or mass lesion/mass effect. Scattered periventricular and subcortical white matter change likely reflects small vessel ischemic microangiopathy, similar in appearance to 2016. The posterior fossa, including the cerebellum, brainstem and fourth ventricle, is within normal limits. The third and lateral ventricles, and basal ganglia are unremarkable in appearance. No mass effect or midline shift is seen. Vascular: No hyperdense vessel or unexpected calcification. Mildly increased  attenuation within the vasculature is thought to be artifactual in nature. Skull: There is no evidence of fracture; visualized osseous structures are unremarkable in appearance. Sinuses/Orbits: The visualized portions of the orbits are within normal limits. The paranasal sinuses and mastoid air cells are well-aerated. Other: No significant soft tissue abnormalities are seen. IMPRESSION: 1. No acute intracranial pathology seen on CT. 2. Scattered small vessel ischemic microangiopathy. Electronically Signed   By: Roanna RaiderJeffery  Chang M.D.   On: 08/15/2017 21:42   Nm Gastric Emptying  Result Date: 08/17/2017 CLINICAL DATA:  Diabetes.  Dysphagia. EXAM: NUCLEAR MEDICINE GASTRIC EMPTYING SCAN TECHNIQUE: After oral ingestion of radiolabeled meal, sequential abdominal images were obtained for 3 hours. Percentage of activity emptying the stomach was calculated at 1 hour, 2 hour, and 3 hr. RADIOPHARMACEUTICALS:  2.0 mCi Tc-3457m sulfur colloid in standardized meal COMPARISON:  None. FINDINGS: Expected location of the stomach in the left upper quadrant. Ingested meal empties the stomach gradually over the course of the study. 44% emptied at 1 hr ( normal >= 10%) 69% emptied at 2 hr ( normal >= 40%) 95% emptied at 3 hr ( normal >= 70%) IMPRESSION: Normal gastric emptying study. Electronically Signed   By: Charlett NoseKevin  Dover M.D.   On: 08/17/2017 12:41   [place] Soft Tissue Head & Neck (non-thyroid)  Result Date: 08/15/2017 CLINICAL DATA:  Dysphagia, history of tracheostomy removal EXAM: ULTRASOUND OF HEAD/NECK SOFT TISSUES TECHNIQUE: Ultrasound examination of the head and neck soft tissues was performed in the area of clinical concern. COMPARISON:  CT 02/06/2017 FINDINGS: Targeted ultrasound of the neck in the region of tracheostomy scar is performed. No cyst or solid mass is visualized. IMPRESSION: No cyst/fluid collection or solid mass to correspond to palpable area near the tracheostomy scar. Electronically Signed   By: Jasmine PangKim  Fujinaga  M.D.   On: 08/15/2017 23:07   Ct Abdomen Pelvis W Contrast  Result Date: 08/15/2017 CLINICAL DATA:  Abdominal pain and vomiting  and diarrhea. EXAM: CT ABDOMEN AND PELVIS WITH CONTRAST TECHNIQUE: Multidetector CT imaging of the abdomen and pelvis was performed using the standard protocol following bolus administration of intravenous contrast. CONTRAST:  75mL ISOVUE-300 IOPAMIDOL (ISOVUE-300) INJECTION 61% COMPARISON:  CT scan dated 04/11/2013 FINDINGS: Lower chest: Normal. Hepatobiliary: Hepatic steatosis. No focal lesions. Biliary tree is normal. Pancreas: Unremarkable. No pancreatic ductal dilatation or surrounding inflammatory changes. Spleen: Normal in size without focal abnormality. Adrenals/Urinary Tract: Adrenal glands are normal. 14 mm cyst in the lower pole of the left kidney, slightly increased in size since 2014. 4 cm slightly complex cyst in the lower pole of the right kidney also slightly increased in size, but otherwise unchanged. No hydronephrosis. Bladder is empty. Stomach/Bowel: Stomach is within normal limits. Appendix appears normal. No evidence of bowel wall thickening, distention, or inflammatory changes. Vascular/Lymphatic: No significant vascular findings are present. No enlarged abdominal or pelvic lymph nodes. Reproductive: Uterus and bilateral adnexa are unremarkable. Other: No abdominal wall hernia or abnormality. No abdominopelvic ascites. Musculoskeletal: No acute abnormality. 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 IMPRESSION: 1. No acute abnormalities. 2. Chronic appendix steatosis. No significant change in cystic lesions in both kidneys. Electronically Signed   By: Francene Boyers M.D.   On: 08/15/2017 17:41   US Abdomen Limited Ruq  Result Date: 08/17/2017 CLINICAL DATA:  Epigastric pain. EXAM: ULTRASOUND ABDOMEN LIMITED RIGHT UPPER QUADRANT COMPARISON:  None. FINDINGS: Gallbladder: No gallstones or wall thickening visualized. No sonographic Murphy  sign noted by sonographer. Common bile duct: Diameter: 5 mm Liver: Increased echogenicity with no focal mass. Portal vein is patent on color Doppler imaging with normal direction of blood flow towards the liver. IMPRESSION: 1. The gallbladder is normal in appearance with no stones. 2. Probable hepatic steatosis. 3. Right renal cyst measuring 3.2 cm. Electronically Signed   By: Gerome Sam III M.D   On: 08/17/2017 18:56    Lab Data:  CBC: Recent Labs  Lab 08/15/17 1600 08/16/17 0011 08/16/17 0350 08/17/17 0453 08/18/17 0337  WBC 8.0  --  10.2 6.2 8.0  NEUTROABS 3.9  --   --   --   --   HGB 12.2  --  11.2* 9.8* 9.5*  HCT 37.3 37.0 35.1* 30.3* 29.7*  MCV 82.0  --  83.6 83.0 82.5  PLT 374  --  329 291 311   Basic Metabolic Panel: Recent Labs  Lab 08/15/17 1600 08/16/17 0350 08/17/17 0453 08/18/17 0337  NA 142 142 141 145  K 3.4* 3.1* 3.1* 3.4*  CL 107 105 107 111  CO2 21* 23 22 22   GLUCOSE 140* 131* 120* 113*  BUN 13 17 14 11   CREATININE 1.54* 2.03* 1.52* 1.23*  CALCIUM 9.0 8.7* 8.3* 8.2*   GFR: Estimated Creatinine Clearance: 76 mL/min (A) (by C-G formula based on SCr of 1.23 mg/dL (H)). Liver Function Tests: Recent Labs  Lab 08/15/17 1600 08/16/17 0350  AST 21 17  ALT 21 19  ALKPHOS 111 100  BILITOT 0.5 0.6  PROT 7.6 6.8  ALBUMIN 3.9 3.5   Recent Labs  Lab 08/15/17 1600  LIPASE 40   Recent Labs  Lab 08/16/17 0011  AMMONIA 26   Coagulation Profile: Recent Labs  Lab 08/16/17 0350  INR 1.20   Cardiac Enzymes: No results for input(s): CKTOTAL, CKMB, CKMBINDEX, TROPONINI in the last 168 hours. BNP (last 3 results) No results for input(s): PROBNP in the last 8760 hours. HbA1C: No results for input(s): HGBA1C in  the last 72 hours. CBG: Recent Labs  Lab 08/17/17 0524 08/17/17 1153 08/17/17 1841 08/17/17 2353 08/18/17 1152  GLUCAP 115* 156* 118* 128* 95   Lipid Profile: No results for input(s): CHOL, HDL, LDLCALC, TRIG, CHOLHDL, LDLDIRECT in  the last 72 hours. Thyroid Function Tests: Recent Labs    08/16/17 0011  TSH 1.193   Anemia Panel: Recent Labs    08/16/17 0011  VITAMINB12 474   Urine analysis:    Component Value Date/Time   COLORURINE YELLOW 08/15/2017 1647   APPEARANCEUR CLOUDY (A) 08/15/2017 1647   LABSPEC 1.017 08/15/2017 1647   PHURINE 5.0 08/15/2017 1647   GLUCOSEU NEGATIVE 08/15/2017 1647   HGBUR SMALL (A) 08/15/2017 1647   BILIRUBINUR NEGATIVE 08/15/2017 1647   KETONESUR NEGATIVE 08/15/2017 1647   PROTEINUR 30 (A) 08/15/2017 1647   UROBILINOGEN 0.2 06/16/2014 0955   NITRITE POSITIVE (A) 08/15/2017 1647   LEUKOCYTESUR LARGE (A) 08/15/2017 1647     Haydee Salter M.D. [facility] 08/18/2017, 2:39 PM  Pager: Loretha Stapler Between 7am to 7pm - call Pager - [email or web address]  After 7pm go to [email or web address] - password [email or web address]  Call night coverage person covering after 7pm

## 2017-08-18 NOTE — Progress Notes (Signed)
Initial Nutrition Assessment  DOCUMENTATION CODES:   Morbid obesity  INTERVENTION:  Snacks D/C Ensure, patient does not like  NUTRITION DIAGNOSIS:   Inadequate oral intake related to poor appetite, dysphagia as evidenced by per patient/family report, percent weight loss  GOAL:   Patient will meet greater than or equal to 90% of their needs  MONITOR:   PO intake, I & O's, Labs, Weight trends  REASON FOR ASSESSMENT:   Malnutrition Screening Tool    ASSESSMENT:   Patient is a 57 year old female with CKD stage III, diabetes mellitus type 2, hypertension, hyperlipidemia, GERD presented with complaints of dysphagia for several weeks, unable to keep any food down.  Spoke with Yesenia [name] at bedside. She reports having issues with swallowing for almost a year, but began to really struggle and lose weight over the last month. She lost 14 pounds/4.2% of her total body weight, insignificant for time frame. States she wasn't eating anything during that time, because if she did she would vomit and have diarrhea. Still having some abdominal cramping now. Ate well this morning however, had pancakes and sausage. Appetite is so-so.  Labs reviewed:  K 3.4,  Medications reviewed and include:  Colace, Insulin NS at 4375mL/hr    NUTRITION - FOCUSED PHYSICAL EXAM:    Most Recent Value  Orbital Region  No depletion  Upper Arm Region  No depletion  Thoracic and Lumbar Region  No depletion  Buccal Region  No depletion  Temple Region  No depletion  Clavicle Bone Region  No depletion  Clavicle and Acromion Bone Region  No depletion  Scapular Bone Region  No depletion  Dorsal Hand  No depletion  Patellar Region  No depletion  Anterior Thigh Region  No depletion  Posterior Calf Region  No depletion  Edema (RD Assessment)  Mild  Hair  Reviewed  Eyes  Reviewed  Mouth  Reviewed  Skin  Reviewed  Nails  Reviewed       Diet Order:  Suicide precautions Suicide precautions DIET SOFT Room  service appropriate? Yes; Fluid consistency: Thin  EDUCATION NEEDS:   No education needs have been identified at this time  Skin:  Skin Assessment: Reviewed RN Assessment  Last BM:  08/17/2017  Height:   Ht Readings from Last 1 Encounters:  08/16/17 5\' 7"  (1.702 m)    Weight:   Wt Readings from Last 1 Encounters:  08/18/17 (!) 316 lb 1.6 oz (143.4 kg)    Ideal Body Weight:  61.36 kg  BMI:  Body mass index is 49.51 kg/m.  Estimated Nutritional Needs:   Kcal:  1700-2000 calories  Protein:  100-120 grams  Fluid:  1.7-2.0L   Yesenia AnoWilliam M. Yesenia Aman, MS, RD LDN Inpatient Clinical Dietitian Pager [phone]

## 2017-08-18 NOTE — Progress Notes (Signed)
Notified MD to clarify insulin administration time and CBG monitoring because the times are off.

## 2017-08-19 LAB — BASIC METABOLIC PANEL
Anion gap: 13 (ref 5–15)
BUN: 8 mg/dL (ref 6–20)
CHLORIDE: 108 mmol/L (ref 101–111)
CO2: 23 mmol/L (ref 22–32)
CREATININE: 1.27 mg/dL — AB (ref 0.44–1.00)
Calcium: 8.5 mg/dL — ABNORMAL LOW (ref 8.9–10.3)
GFR calc Af Amer: 54 mL/min — ABNORMAL LOW (ref >60)
GFR calc non Af Amer: 46 mL/min — ABNORMAL LOW (ref >60)
GLUCOSE: 131 mg/dL — AB (ref 65–99)
POTASSIUM: 3 mmol/L — AB (ref 3.5–5.1)
SODIUM: 144 mmol/L (ref 135–145)

## 2017-08-19 LAB — GLUCOSE, CAPILLARY
GLUCOSE-CAPILLARY: 154 mg/dL — AB (ref 65–99)
Glucose-Capillary: 131 mg/dL — ABNORMAL HIGH (ref 65–99)
Glucose-Capillary: 144 mg/dL — ABNORMAL HIGH (ref 65–99)
Glucose-Capillary: 152 mg/dL — ABNORMAL HIGH (ref 65–99)
Glucose-Capillary: 188 mg/dL — ABNORMAL HIGH (ref 65–99)

## 2017-08-19 LAB — CBC
HCT: 29.8 % — ABNORMAL LOW (ref 36.0–46.0)
HEMOGLOBIN: 9.7 g/dL — AB (ref 12.0–15.0)
MCH: 27 pg (ref 26.0–34.0)
MCHC: 32.6 g/dL (ref 30.0–36.0)
MCV: 83 fL (ref 78.0–100.0)
Platelets: 315 10*3/uL (ref 150–400)
RBC: 3.59 MIL/uL — AB (ref 3.87–5.11)
RDW: 14.2 % (ref 11.5–15.5)
WBC: 6.6 10*3/uL (ref 4.0–10.5)

## 2017-08-19 MED ORDER — PANTOPRAZOLE SODIUM 40 MG PO TBEC
40.0000 mg | DELAYED_RELEASE_TABLET | Freq: Two times a day (BID) | ORAL | Status: DC
  Administered 2017-08-19 – 2017-08-22 (×7): 40 mg via ORAL
  Filled 2017-08-19 (×7): qty 1

## 2017-08-19 NOTE — Progress Notes (Signed)
[facility]                                                                              Patient Demographics  Yesenia [name], is a 57 y.o. female, DOB - Apr 21, 1961, ZOX:[id number]  Admit date - 08/15/2017   Admitting Physician Ankit Joline Maxcyhirag Amin, MD  Outpatient Primary MD for the patient is Marva PandaMillsaps, Kimberly, NP  Outpatient specialists:   LOS - 4  days   Medical records reviewed and are as summarized below:    Chief Complaint  Patient presents with  . Abdominal Pain       Brief summary   Patient is a 57 year old female with CKD stage III, diabetes mellitus type 2, hypertension, hyperlipidemia, GERD presented with complaints of dysphagia.  Patient reported dysphagia to solid food for several weeks but over the course of the past 2 weeks this had progressed to both solids and liquids.  She had not been able to keep any food down.  Patient reported no prior history of dysphagia and no history of endoscopy in the past.  Patient was admitted for further workup.    Assessment & Plan    Principal Problem:   Dysphagia - GI consulted, underwent EGD, no stricture or mass, recommended possible gastroparesis - CT of the abdomen and pelvis showed no acute intra-abdominal abnormalities -Gastric emptying study normal, Globus versus functional -08/19/17 Continue PPI, placed on full liquid diet, advance to soft solids in a.m.   Active Problems:  UTI -08/19/17 continue IV Rocephin, UCx multiple flora - 2/5--> present  Dizziness, dehydration -Continue IV fluids  Acute kidney injury on CKD stage II-III -Baseline creatinine 1.0 -08/19/17 Continue IV fluid hydration, creatinine improving - REnal U/S pending  Suicidal ideation -08/19/17 Continue sitter - Psychiatry consulted, recommended patient to be discharged to inpatient behavioral health once she is medically ready - Discussed in detail with the patient, [facility]  Diabetes  mellitus -Metformin on hold, continue sliding scale insulin  Obstructive sleep apnea -Continue supportive care, patient used to have tracheostomy for this, now out since November 2018  Code Status: Full CODE STATUS DVT Prophylaxis: SCDs Family Communication: Discussed in detail with the patient, all imaging results, lab results explained to the patient    [facility] tolerating solid diet possibly in a.m.  Time Spent in minutes 25 minutes  Procedures:  EGD - gastritis, multiple non bleeding ulcers Gastric emptying study 2/6 normal  Consultants:   GI Psychiatry  Antimicrobials:      Medications  Scheduled Meds: . amLODipine  10 mg Oral Daily  . atenolol  50 mg Oral Daily  . busPIRone  15 mg Oral BID  . clonazePAM  1 mg Oral QHS  . docusate sodium  100 mg Oral BID  . DULoxetine  30 mg Oral Daily  . gabapentin  900 mg Oral QHS  . hydrOXYzine  50 mg Oral QHS  . insulin aspart  0-5 Units Subcutaneous QHS  . insulin aspart  0-9 Units Subcutaneous Q6H  . mouth rinse  15 mL Mouth Rinse BID  . pantoprazole  40 mg  Oral BID  . promethazine  12.5 mg Oral Q6H  . rosuvastatin  10 mg Oral Daily   Continuous Infusions: . sodium chloride 75 mL/hr at 08/19/17 1418  . cefTRIAXone (ROCEPHIN)  IV Stopped (08/18/17 2316)  . dextrose 5 % and 0.45% NaCl Stopped (08/17/17 1900)   PRN Meds:.acetaminophen **OR** acetaminophen, albuterol, hyoscyamine   Antibiotics   Anti-infectives (From admission, onward)   Start     Dose/Rate Route Frequency Ordered Stop   08/16/17 2200  cefTRIAXone (ROCEPHIN) 1 g in dextrose 5 % 50 mL IVPB     1 g 100 mL/hr over 30 Minutes Intravenous Every 24 hours 08/15/17 2240     08/15/17 2230  cefTRIAXone (ROCEPHIN) 1 g in dextrose 5 % 50 mL IVPB     1 g 100 mL/hr over 30 Minutes Intravenous  Once 08/15/17 2215 08/16/17 0259        Subjective:   Yesenia [name] was seen and examined today. Denies any specific complaints. Sitter at  the bedside.   08/19/17: Patient denies chest pain, shortness of breath, abdominal pain. No fevers.   Objective:   Vitals:   08/19/17 0452 08/19/17 0500 08/19/17 1002 08/19/17 1318  BP: (!) 115/56  (!) 100/50 123/71  Pulse: 84  76 72  Resp: 18   18  Temp: 98.9 F (37.2 C)   98.6 F (37 C)  TempSrc: Oral   Oral  SpO2: 95%  98% 99%  Weight:  (!) 145.9 kg (321 lb 10.4 oz)    Height:        Intake/Output Summary (Last 24 hours) at 08/19/2017 1915 Last data filed at 08/19/2017 1900 Gross per 24 hour  Intake 5581.25 ml  Output -  Net 5581.25 ml     Wt Readings from Last 3 Encounters:  08/19/17 (!) 145.9 kg (321 lb 10.4 oz)  05/12/17 (!) 145.7 kg (321 lb 4 oz)  04/27/17 (!) 146.5 kg (323 lb)     Exam 08/19/17 patient examined no change from prior.   General: Alert and oriented x 3, NAD  Eyes:   HEENT:    Cardiovascular: S1 S2 auscultated, Regular rate and rhythm. No pedal edema b/l  Respiratory: Clear to auscultation bilaterally, no wheezing, rales or rhonchi  Gastrointestinal: Soft, nontender, nondistended, + bowel sounds  Ext: no pedal edema bilaterally  Neuro: no new deficits   Musculoskeletal: No digital cyanosis, clubbing  Skin: No rashes  Psych: Normal affect and demeanor, alert and oriented x3    Data Reviewed:  I have personally reviewed following labs and imaging studies  Micro Results Recent Results (from the past 240 hour(s))  MRSA PCR Screening     Status: None   Collection Time: 08/17/17  2:44 AM  Result Value Ref Range Status   MRSA by PCR NEGATIVE NEGATIVE Final    Comment:        The GeneXpert MRSA Assay (FDA approved for NASAL specimens only), is one component of a comprehensive MRSA colonization surveillance program. It is not intended to diagnose MRSA infection nor to guide or monitor treatment for MRSA infections. Performed at [facility], [address], [place], [place] [address]   Culture, Urine     Status: Abnormal    Collection Time: 08/17/17  5:29 AM  Result Value Ref Range Status   Specimen Description URINE, CLEAN CATCH  Final   Special Requests   Final    NONE Performed at [facility], [address] [place], [place]  [address]    Culture MULTIPLE SPECIES PRESENT, SUGGEST RECOLLECTION (A)  Final   Report Status 08/18/2017 FINAL  Final    Radiology Reports Dg Chest 2 View  Result Date: 08/15/2017 CLINICAL DATA:  Subacute onset of generalized abdominal pain, chest pain and dizziness. EXAM: CHEST  2 VIEW COMPARISON:  Chest radiograph performed 03/19/2017 FINDINGS: The lungs are well-aerated. Minimal right basilar atelectasis is noted. There is no evidence of pleural effusion or pneumothorax. The heart is borderline normal in size. No acute osseous abnormalities are seen. IMPRESSION: Minimal right basilar atelectasis noted.  Lungs otherwise clear. Electronically Signed   By: Roanna Raider M.D.   On: 08/15/2017 21:45   Ct Head Wo Contrast  Result Date: 08/15/2017 CLINICAL DATA:  Acute onset of severe left-sided headache, nausea and vomiting. EXAM: CT HEAD WITHOUT CONTRAST TECHNIQUE: Contiguous axial images were obtained from the base of the skull through the vertex without intravenous contrast. COMPARISON:  CT of the head performed 09/24/2014, and MRI of the brain performed 04/13/2016 FINDINGS: Brain: No evidence of acute infarction, hemorrhage, hydrocephalus, extra-axial collection or mass lesion/mass effect. Scattered periventricular and subcortical white matter change likely reflects small vessel ischemic microangiopathy, similar in appearance to 2016. The posterior fossa, including the cerebellum, brainstem and fourth ventricle, is within normal limits. The third and lateral ventricles, and basal ganglia are unremarkable in appearance. No mass effect or midline shift is seen. Vascular: No hyperdense vessel or unexpected calcification. Mildly increased attenuation within the vasculature is thought to be  artifactual in nature. Skull: There is no evidence of fracture; visualized osseous structures are unremarkable in appearance. Sinuses/Orbits: The visualized portions of the orbits are within normal limits. The paranasal sinuses and mastoid air cells are well-aerated. Other: No significant soft tissue abnormalities are seen. IMPRESSION: 1. No acute intracranial pathology seen on CT. 2. Scattered small vessel ischemic microangiopathy. Electronically Signed   By: Roanna Raider M.D.   On: 08/15/2017 21:42   Nm Gastric Emptying  Result Date: 08/17/2017 CLINICAL DATA:  Diabetes.  Dysphagia. EXAM: NUCLEAR MEDICINE GASTRIC EMPTYING SCAN TECHNIQUE: After oral ingestion of radiolabeled meal, sequential abdominal images were obtained for 3 hours. Percentage of activity emptying the stomach was calculated at 1 hour, 2 hour, and 3 hr. RADIOPHARMACEUTICALS:  2.0 mCi Tc-18m sulfur colloid in standardized meal COMPARISON:  None. FINDINGS: Expected location of the stomach in the left upper quadrant. Ingested meal empties the stomach gradually over the course of the study. 44% emptied at 1 hr ( normal >= 10%) 69% emptied at 2 hr ( normal >= 40%) 95% emptied at 3 hr ( normal >= 70%) IMPRESSION: Normal gastric emptying study. Electronically Signed   By: Charlett Nose M.D.   On: 08/17/2017 12:41   US Soft Tissue Head & Neck (non-thyroid)  Result Date: 08/15/2017 CLINICAL DATA:  Dysphagia, history of tracheostomy removal EXAM: ULTRASOUND OF HEAD/NECK SOFT TISSUES TECHNIQUE: Ultrasound examination of the head and neck soft tissues was performed in the area of clinical concern. COMPARISON:  CT 02/06/2017 FINDINGS: Targeted ultrasound of the neck in the region of tracheostomy scar is performed. No cyst or solid mass is visualized. IMPRESSION: No cyst/fluid collection or solid mass to correspond to palpable area near the tracheostomy scar. Electronically Signed   By: Jasmine Pang M.D.   On: 08/15/2017 23:07   Ct Abdomen Pelvis W  Contrast  Result Date: 08/15/2017 CLINICAL DATA:  Abdominal pain and vomiting and diarrhea. EXAM: CT ABDOMEN AND PELVIS WITH CONTRAST TECHNIQUE: Multidetector CT imaging  of the abdomen and pelvis was performed using the standard protocol following bolus administration of intravenous contrast. CONTRAST:  75mL ISOVUE-300 IOPAMIDOL (ISOVUE-300) INJECTION 61% COMPARISON:  CT scan dated 04/11/2013 FINDINGS: Lower chest: Normal. Hepatobiliary: Hepatic steatosis. No focal lesions. Biliary tree is normal. Pancreas: Unremarkable. No pancreatic ductal dilatation or surrounding inflammatory changes. Spleen: Normal in size without focal abnormality. Adrenals/Urinary Tract: Adrenal glands are normal. 14 mm cyst in the lower pole of the left kidney, slightly increased in size since 2014. 4 cm slightly complex cyst in the lower pole of the right kidney also slightly increased in size, but otherwise unchanged. No hydronephrosis. Bladder is empty. Stomach/Bowel: Stomach is within normal limits. Appendix appears normal. No evidence of bowel wall thickening, distention, or inflammatory changes. Vascular/Lymphatic: No significant vascular findings are present. No enlarged abdominal or pelvic lymph nodes. Reproductive: Uterus and bilateral adnexa are unremarkable. Other: No abdominal wall hernia or abnormality. No abdominopelvic ascites. Musculoskeletal: No acute abnormality. 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 IMPRESSION: 1. No acute abnormalities. 2. Chronic appendix steatosis. No significant change in cystic lesions in both kidneys. Electronically Signed   By: Francene Boyers M.D.   On: 08/15/2017 17:41   US Renal  Result Date: 08/18/2017 CLINICAL DATA:  Acute renal insufficiency EXAM: RENAL / URINARY TRACT ULTRASOUND COMPLETE COMPARISON:  CT abdomen and pelvis August 15, 2017 FINDINGS: Right Kidney: Length: 11.0 cm. Echogenicity and renal cortical thickness are within normal limits. No  perinephric fluid or hydronephrosis visualized. There is a cyst in the mid right kidney measuring 3.7 x 3.4 x 3.0 cm. No sonographically demonstrable calculus or ureterectasis. Left Kidney: Length: 10.4 cm. Echogenicity and renal cortical thickness are within normal limits. No perinephric fluid or hydronephrosis visualized. There is a cyst in the mid to lower pole region on the left measuring 1.4 x 1.2 x 1.1 cm. No sonographically demonstrable calculus or ureterectasis. Bladder: Appears normal for degree of bladder distention. IMPRESSION: Renal cortical cyst on each side. No obstructing focus in either kidney. Renal cortical thickness and renal echogenicity within normal limits bilaterally. Electronically Signed   By: Bretta Bang III M.D.   On: 08/18/2017 14:38   US Abdomen Limited Ruq  Result Date: 08/17/2017 CLINICAL DATA:  Epigastric pain. EXAM: ULTRASOUND ABDOMEN LIMITED RIGHT UPPER QUADRANT COMPARISON:  None. FINDINGS: Gallbladder: No gallstones or wall thickening visualized. No sonographic Murphy sign noted by sonographer. Common bile duct: Diameter: 5 mm Liver: Increased echogenicity with no focal mass. Portal vein is patent on color Doppler imaging with normal direction of blood flow towards the liver. IMPRESSION: 1. The gallbladder is normal in appearance with no stones. 2. Probable hepatic steatosis. 3. Right renal cyst measuring 3.2 cm. Electronically Signed   By: Gerome Sam III M.D   On: 08/17/2017 18:56    Lab Data:  CBC: Recent Labs  Lab 08/15/17 1600 08/16/17 0011 08/16/17 0350 08/17/17 0453 08/18/17 0337 08/19/17 0617  WBC 8.0  --  10.2 6.2 8.0 6.6  NEUTROABS 3.9  --   --   --   --   --   HGB 12.2  --  11.2* 9.8* 9.5* 9.7*  HCT 37.3 37.0 35.1* 30.3* 29.7* 29.8*  MCV 82.0  --  83.6 83.0 82.5 83.0  PLT 374  --  329 291 311 315   Basic Metabolic Panel: Recent Labs  Lab 08/15/17 1600 08/16/17 0350 08/17/17 0453 08/18/17 0337 08/19/17 0617  NA 142 142 141 145 144   K  3.4* 3.1* 3.1* 3.4* 3.0*  CL 107 105 107 111 108  CO2 21* 23 22 22 23   GLUCOSE 140* 131* 120* 113* 131*  BUN 13 17 14 11 8   CREATININE 1.54* 2.03* 1.52* 1.23* 1.27*  CALCIUM 9.0 8.7* 8.3* 8.2* 8.5*   GFR: Estimated Creatinine Clearance: 74.4 mL/min (A) (by C-G formula based on SCr of 1.27 mg/dL (H)). Liver Function Tests: Recent Labs  Lab 08/15/17 1600 08/16/17 0350  AST 21 17  ALT 21 19  ALKPHOS 111 100  BILITOT 0.5 0.6  PROT 7.6 6.8  ALBUMIN 3.9 3.5   Recent Labs  Lab 08/15/17 1600  LIPASE 40   Recent Labs  Lab 08/16/17 0011  AMMONIA 26   Coagulation Profile: Recent Labs  Lab 08/16/17 0350  INR 1.20   Cardiac Enzymes: No results for input(s): CKTOTAL, CKMB, CKMBINDEX, TROPONINI in the last 168 hours. BNP (last 3 results) No results for input(s): PROBNP in the last 8760 hours. HbA1C: No results for input(s): HGBA1C in the last 72 hours. CBG: Recent Labs  Lab 08/18/17 1733 08/18/17 2321 08/19/17 0353 08/19/17 1147 08/19/17 1819  GLUCAP 119* 151* 131* 188* 144*   Lipid Profile: No results for input(s): CHOL, HDL, LDLCALC, TRIG, CHOLHDL, LDLDIRECT in the last 72 hours. Thyroid Function Tests: No results for input(s): TSH, T4TOTAL, FREET4, T3FREE, THYROIDAB in the last 72 hours. Anemia Panel: No results for input(s): VITAMINB12, FOLATE, FERRITIN, TIBC, IRON, RETICCTPCT in the last 72 hours. Urine analysis:    Component Value Date/Time   COLORURINE YELLOW 08/15/2017 1647   APPEARANCEUR CLOUDY (A) 08/15/2017 1647   LABSPEC 1.017 08/15/2017 1647   PHURINE 5.0 08/15/2017 1647   GLUCOSEU NEGATIVE 08/15/2017 1647   HGBUR SMALL (A) 08/15/2017 1647   BILIRUBINUR NEGATIVE 08/15/2017 1647   KETONESUR NEGATIVE 08/15/2017 1647   PROTEINUR 30 (A) 08/15/2017 1647   UROBILINOGEN 0.2 06/16/2014 0955   NITRITE POSITIVE (A) 08/15/2017 1647   LEUKOCYTESUR LARGE (A) 08/15/2017 1647     Darlin Drop M.D. [facility] 08/19/2017, 7:15 PM  Pager:  Loretha Stapler Between 7am to 7pm - call Pager - [phone]  After 7pm go to [email or web address] - password [email or web address]  Call night coverage person covering after 7pm

## 2017-08-20 LAB — HEMOGLOBIN A1C
HEMOGLOBIN A1C: 7.3 % — AB (ref 4.8–5.6)
Mean Plasma Glucose: 162.81 mg/dL

## 2017-08-20 LAB — CBC
HCT: 30.8 % — ABNORMAL LOW (ref 36.0–46.0)
Hemoglobin: 9.9 g/dL — ABNORMAL LOW (ref 12.0–15.0)
MCH: 26.7 pg (ref 26.0–34.0)
MCHC: 32.1 g/dL (ref 30.0–36.0)
MCV: 83 fL (ref 78.0–100.0)
PLATELETS: 303 10*3/uL (ref 150–400)
RBC: 3.71 MIL/uL — AB (ref 3.87–5.11)
RDW: 14 % (ref 11.5–15.5)
WBC: 7.7 10*3/uL (ref 4.0–10.5)

## 2017-08-20 LAB — BASIC METABOLIC PANEL
Anion gap: 9 (ref 5–15)
BUN: 9 mg/dL (ref 6–20)
CALCIUM: 8.3 mg/dL — AB (ref 8.9–10.3)
CO2: 24 mmol/L (ref 22–32)
CREATININE: 0.97 mg/dL (ref 0.44–1.00)
Chloride: 109 mmol/L (ref 101–111)
GFR calc non Af Amer: >60 mL/min (ref >60)
Glucose, Bld: 162 mg/dL — ABNORMAL HIGH (ref 65–99)
Potassium: 3.3 mmol/L — ABNORMAL LOW (ref 3.5–5.1)
SODIUM: 142 mmol/L (ref 135–145)

## 2017-08-20 LAB — GLUCOSE, CAPILLARY
GLUCOSE-CAPILLARY: 157 mg/dL — AB (ref 65–99)
GLUCOSE-CAPILLARY: 150 mg/dL — AB (ref 65–99)
GLUCOSE-CAPILLARY: 167 mg/dL — AB (ref 65–99)
Glucose-Capillary: 140 mg/dL — ABNORMAL HIGH (ref 65–99)

## 2017-08-20 MED ORDER — INSULIN ASPART 100 UNIT/ML ~~LOC~~ SOLN
0.0000 [IU] | Freq: Four times a day (QID) | SUBCUTANEOUS | Status: DC
  Administered 2017-08-21: 2 [IU] via SUBCUTANEOUS
  Administered 2017-08-21: 1 [IU] via SUBCUTANEOUS
  Administered 2017-08-21 – 2017-08-22 (×2): 2 [IU] via SUBCUTANEOUS

## 2017-08-20 NOTE — Clinical Social Work Note (Signed)
Clinical Social Work Assessment  Patient Details  Name: Yesenia [name] MRN: [id number] Date of Birth: 12-01-1960  Date of referral:  08/20/17               Reason for consult:  Discharge Planning                Permission sought to share information with:  Family Supports, Case Production designer, theatre/television/filmManager, Oceanographeracility Contact Representative Permission granted to share information::  Yes, Verbal Permission Granted  Name::     Sport and exercise psychologistCatrina Hall  Agency::     Relationship::  Daughter  Contact Information:  yes  Housing/Transportation Living arrangements for the past 2 months:  Apartment Source of Information:  Patient Patient Interpreter Needed:  None Criminal Activity/Legal Involvement Pertinent to Current Situation/Hospitalization:  No - Comment as needed Significant Relationships:  Adult Children Lives with:  Self Do you feel safe going back to the place where you live?  Yes Need for family participation in patient care:  No (Coment)  Care giving concerns:  [facility]. [facility]  Pt operating independently prior to admission--no care given noted.   Social Worker assessment / plan:  [facility] is medically ready for discharge.  [facility]  Paperwork will be completed closer to discharge.  Employment status:  Disabled (Comment on whether or not currently receiving Disability)(receiving disability) Insurance information[place]  [place] Recommend[place]:  Not assessed at this time Information / Referral to community resources:  Inpatient Psychiatric Care (Comment Required)(major depressive disorder, recurrent severe, without psychosis (HCC))  Patient/Family's Response to care:  [facility]  Patient/Family's Understanding of and Emotional Response to Diagnosis, Current Treatment, and Prognosis:  [facility] and is agreeable at this time.  Patient denies any further question regarding treatment plan at this time.  Emotional Assessment Appearance:  Appears stated age Attitude/Demeanor/Rapport:  Other(Appropriate) Affect (typically observed):  Accepting Orientation:  Oriented to Self, Oriented to Place, Oriented to  Time, Oriented to Situation Alcohol / Substance use:  Not Applicable Psych involvement (Current and /or in the community):  Yes (Comment)([facility]: major depressive disorder, recurrent severe, without psychosis (HCC))  Discharge Needs  Concerns to be addressed:  Mental Health Concerns Readmission within the last 30 days:  No Current discharge risk:  Psychiatric Illness Barriers to Discharge:  No Barriers Identified   Arvin Collardara W Aldo Sondgeroth, LCSWA 08/20/2017, 1:27 PM

## 2017-08-20 NOTE — Discharge Summary (Addendum)
Discharge Summary  Yesenia [name] YQM:[id number] DOB: Nov 30, 1960  PCP: Everardo Beals, NP  Admit date: 08/15/2017 Discharge date: 08/20/2017  Time spent: 25 aminutes  Recommendations for Outpatient Follow-up:  1. Follow up with psychiatry post hospitalization 2. Continue care at behavioral health facility  Discharge Diagnoses:  Active Hospital Problems   Diagnosis Date Noted  . MDD (major depressive disorder), recurrent severe, without psychosis ([place])   . Dysphagia 08/16/2017  . Nausea and vomiting   . Abdominal pain, epigastric   . AKI (acute kidney injury) ([place]) 08/15/2017    Resolved Hospital Problems  No resolved problems to display.    Discharge Condition: stable  Diet recommendation: resume previous diet  Vitals:   08/19/17 2100 08/20/17 1100  BP: (!) 101/55 123/77  Pulse: 77 76  Resp: 15 16  Temp: 99 F (37.2 C) 98.4 F (36.9 C)  SpO2: 98% 98%    History of present illness:  Patient is a 57 year old female with CKD stage III, diabetes mellitus type 2, hypertension, hyperlipidemia, GERD presented with complaints of dysphagia. Patient reported dysphagia to solid food for several weeks but over the course of the past 2 weeks this had progressed to both solids and liquids.  She had not been able to keep any food down.  Patient reported no prior history of dysphagia and no history of endoscopy in the past.  Patient was admitted for further workup.  08/16/17: EGD done and revealed peptic ulcer disease with recommendations to continue PPI protonix 67m PO BID for 1 month, and the once daily thereafter. She should also continue phenergan. Some of her symptoms are likely related to PUD noted on EGD, although perhaps she has some symptoms related to UTI and other functional component as well.   Hospital course complicate by UTI present on admission and suicidal ideation. Psychiatry consulted and following. Patient agreeable to be committed to a behavioral  institution.  08/20/17: patient seen and examined at her bedside. No new complaints. On IV ceftriaxone for UTI which she will complete tomorrow 08/21/17. Afebrile, no leukocytosis, vital signs stable. Admits to persistent suicidal ideation. Denies homicidal ideation. Continue 1-1 sitter.     Hospital Course:  Principal Problem:   MDD (major depressive disorder), recurrent severe, without psychosis ([place] Active Problems:   AKI (acute kidney injury) ([place]   Nausea and vomiting   Abdominal pain, epigastric   Dysphagia  Dysphagia, resolved - GI consulted, underwent EGD on 08/16/17, no stricture or mass, possible gastroparesis, recommended phenergan - CT of the abdomen and pelvis showed no acute intra-abdominal abnormalities -Gastric emptying study normal, Globus versus functional -08/20/17 Continue PPI -Tolerating diet  UTI, poa -08/20/17 continue IV Rocephin, UCx multiple flora, urine cx reordered, in process - rocephin 2/5--> 08/21/17 -stop rocephin tomorrow 08/21/17  Dizziness, dehydration, resolved -Continue IV fluids  Acute kidney injury on CKD stage II-III, improving -Baseline creatinine 1.1 -08/20/17 serum cr 1.27  -08/20/17 Continue IV fluid hydration -Renal U/S 08/18/17: Renal cortical cyst on each side. No obstructing focus in either kidney. Renal cortical thickness and renal echogenicity within normal limits bilaterally.  Suicidal ideation, persistent -08/20/17 Continue sitter -Psychiatry consulted, recommended patient to be discharged to inpatient behavioral health once she is medically ready -08/20/17 patient is medically cleared -Discussed in detail with the patient, she is voluntarily ready to go to behavior health facility  Diabetes mellitus -Metformin on hold, continue sliding scale insulin -08/20/17 obtain A1C  Hx of Obstructive sleep apnea -Continue supportive care, patient used to have tracheostomy for this,  now out since November 2018 -may need to be reevaluated in  the outpatient setting   Procedures:  EGD 08/16/17  Consultations:  GI  psychiatry  Discharge Exam: BP 123/77 (BP Location: Left Arm)   Pulse 76   Temp 98.4 F (36.9 C) (Oral)   Resp 16   Ht '5\' 7"'  (1.702 m)   Wt (!) 145.6 kg (320 lb 15.8 oz)   SpO2 98%   BMI 50.27 kg/m   General: 57 yo obese female A&O x 3  Cardiovascular: RRR no rubs or gallops Respiratory: CTA no wheezes or rales  Discharge Instructions You were cared for by a hospitalist during your hospital stay. If you have any questions about your discharge medications or the care you received while you were in the hospital after you are discharged, you can call the unit and asked to speak with the hospitalist on call if the hospitalist that took care of you is not available. Once you are discharged, your primary care physician will handle any further medical issues. Please note that NO REFILLS for any discharge medications will be authorized once you are discharged, as it is imperative that you return to your primary care physician (or establish a relationship with a primary care physician if you do not have one) for your aftercare needs so that they can reassess your need for medications and monitor your lab values.   Allergies as of 08/20/2017      Reactions   Buprenorphine Hcl Hives   Morphine And Related Hives, Dermatitis      Medication List    STOP taking these medications   ibuprofen 800 MG tablet Commonly known as:  ADVIL,MOTRIN   traMADol 50 MG tablet Commonly known as:  ULTRAM     TAKE these medications   albuterol 108 (90 Base) MCG/ACT inhaler Commonly known as:  PROVENTIL HFA;VENTOLIN HFA Inhale 2 puffs into the lungs every 6 (six) hours as needed for wheezing or shortness of breath.   amLODipine 10 MG tablet Commonly known as:  NORVASC Take 10 mg by mouth daily.   atenolol 50 MG tablet Commonly known as:  TENORMIN Take 50 mg by mouth daily.   busPIRone 15 MG tablet Commonly known as:   BUSPAR Take 15 mg by mouth 2 (two) times daily.   clonazePAM 1 MG tablet Commonly known as:  KLONOPIN Take 1 tablet (1 mg total) by mouth at bedtime.   DULoxetine 30 MG capsule Commonly known as:  CYMBALTA Take 30 mg by mouth daily.   gabapentin 300 MG capsule Commonly known as:  NEURONTIN Take 900 mg by mouth at bedtime.   hydrOXYzine 50 MG tablet Commonly known as:  ATARAX/VISTARIL Take 50 mg by mouth at bedtime.   hyoscyamine 0.125 MG SL tablet Commonly known as:  LEVSIN SL TAKE 0.125 mg TABLET BY MOUTH FOUR TIMES DAILY FOR IBS   metFORMIN 500 MG tablet Commonly known as:  GLUCOPHAGE Take 500 mg by mouth daily.   Na Sulfate-K Sulfate-Mg Sulf 17.5-3.13-1.6 GM/177ML Soln Take 1 kit as directed by mouth.   omeprazole 40 MG capsule Commonly known as:  PRILOSEC Take 1 capsule (40 mg total) by mouth 2 (two) times daily before a meal.   ondansetron 4 MG disintegrating tablet Commonly known as:  ZOFRAN-ODT Take 4 mg by mouth every 8 (eight) hours as needed for nausea or vomiting.   PEG 3350 Powd Take 17 g by mouth daily as needed.   rosuvastatin 10 MG tablet Commonly known as:  CRESTOR Take 10 mg by mouth daily.   Vitamin D (Ergocalciferol) 50000 units Caps capsule Commonly known as:  DRISDOL Take 50,000 Units by mouth every Monday.      Allergies  Allergen Reactions  . Buprenorphine Hcl Hives  . Morphine And Related Hives and Dermatitis   Follow-up Information    Everardo Beals, NP Follow up.   Contact information: [facility] [place] [address] [phone]            The results of significant diagnostics from this hospitalization (including imaging, microbiology, ancillary and laboratory) are listed below for reference.    Significant Diagnostic Studies: Dg Chest 2 View  Result Date: 08/15/2017 CLINICAL DATA:  Subacute onset of generalized abdominal pain, chest pain and dizziness. EXAM: CHEST  2 VIEW  COMPARISON:  Chest radiograph performed 03/19/2017 FINDINGS: The lungs are well-aerated. Minimal right basilar atelectasis is noted. There is no evidence of pleural effusion or pneumothorax. The heart is borderline normal in size. No acute osseous abnormalities are seen. IMPRESSION: Minimal right basilar atelectasis noted.  Lungs otherwise clear. Electronically Signed   By: Garald Balding M.D.   On: 08/15/2017 21:45   Ct Head Wo Contrast  Result Date: 08/15/2017 CLINICAL DATA:  Acute onset of severe left-sided headache, nausea and vomiting. EXAM: CT HEAD WITHOUT CONTRAST TECHNIQUE: Contiguous axial images were obtained from the base of the skull through the vertex without intravenous contrast. COMPARISON:  CT of the head performed 09/24/2014, and MRI of the brain performed 04/13/2016 FINDINGS: Brain: No evidence of acute infarction, hemorrhage, hydrocephalus, extra-axial collection or mass lesion/mass effect. Scattered periventricular and subcortical white matter change likely reflects small vessel ischemic microangiopathy, similar in appearance to 2016. The posterior fossa, including the cerebellum, brainstem and fourth ventricle, is within normal limits. The third and lateral ventricles, and basal ganglia are unremarkable in appearance. No mass effect or midline shift is seen. Vascular: No hyperdense vessel or unexpected calcification. Mildly increased attenuation within the vasculature is thought to be artifactual in nature. Skull: There is no evidence of fracture; visualized osseous structures are unremarkable in appearance. Sinuses/Orbits: The visualized portions of the orbits are within normal limits. The paranasal sinuses and mastoid air cells are well-aerated. Other: No significant soft tissue abnormalities are seen. IMPRESSION: 1. No acute intracranial pathology seen on CT. 2. Scattered small vessel ischemic microangiopathy. Electronically Signed   By: Garald Balding M.D.   On: 08/15/2017 21:42   Nm  Gastric Emptying  Result Date: 08/17/2017 CLINICAL DATA:  Diabetes.  Dysphagia. EXAM: NUCLEAR MEDICINE GASTRIC EMPTYING SCAN TECHNIQUE: After oral ingestion of radiolabeled meal, sequential abdominal images were obtained for 3 hours. Percentage of activity emptying the stomach was calculated at 1 hour, 2 hour, and 3 hr. RADIOPHARMACEUTICALS:  2.0 mCi Tc-14msulfur colloid in standardized meal COMPARISON:  None. FINDINGS: Expected location of the stomach in the left upper quadrant. Ingested meal empties the stomach gradually over the course of the study. 44% emptied at 1 hr ( normal >= 10%) 69% emptied at 2 hr ( normal >= 40%) 95% emptied at 3 hr ( normal >= 70%) IMPRESSION: Normal gastric emptying study. Electronically Signed   By: KRolm BaptiseM.D.   On: 08/17/2017 12:41   UKoreaSoft Tissue Head & Neck (non-thyroid)  Result Date: 08/15/2017 CLINICAL DATA:  Dysphagia, history of tracheostomy removal EXAM: ULTRASOUND OF HEAD/NECK SOFT TISSUES TECHNIQUE: Ultrasound examination of the head and neck soft tissues was performed in the area of  clinical concern. COMPARISON:  CT 02/06/2017 FINDINGS: Targeted ultrasound of the neck in the region of tracheostomy scar is performed. No cyst or solid mass is visualized. IMPRESSION: No cyst/fluid collection or solid mass to correspond to palpable area near the tracheostomy scar. Electronically Signed   By: Donavan Foil M.D.   On: 08/15/2017 23:07   Ct Abdomen Pelvis W Contrast  Result Date: 08/15/2017 CLINICAL DATA:  Abdominal pain and vomiting and diarrhea. EXAM: CT ABDOMEN AND PELVIS WITH CONTRAST TECHNIQUE: Multidetector CT imaging of the abdomen and pelvis was performed using the standard protocol following bolus administration of intravenous contrast. CONTRAST:  9m ISOVUE-300 IOPAMIDOL (ISOVUE-300) INJECTION 61% COMPARISON:  CT scan dated 04/11/2013 FINDINGS: Lower chest: Normal. Hepatobiliary: Hepatic steatosis. No focal lesions. Biliary tree is normal. Pancreas:  Unremarkable. No pancreatic ductal dilatation or surrounding inflammatory changes. Spleen: Normal in size without focal abnormality. Adrenals/Urinary Tract: Adrenal glands are normal. 14 mm cyst in the lower pole of the left kidney, slightly increased in size since 2014. 4 cm slightly complex cyst in the lower pole of the right kidney also slightly increased in size, but otherwise unchanged. No hydronephrosis. Bladder is empty. Stomach/Bowel: Stomach is within normal limits. Appendix appears normal. No evidence of bowel wall thickening, distention, or inflammatory changes. Vascular/Lymphatic: No significant vascular findings are present. No enlarged abdominal or pelvic lymph nodes. Reproductive: Uterus and bilateral adnexa are unremarkable. Other: No abdominal wall hernia or abnormality. No abdominopelvic ascites. Musculoskeletal: No acute abnormality. 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 IMPRESSION: 1. No acute abnormalities. 2. Chronic appendix steatosis. No significant change in cystic lesions in both kidneys. Electronically Signed   By: JLorriane ShireM.D.   On: 08/15/2017 17:41   UKoreaRenal  Result Date: 08/18/2017 CLINICAL DATA:  Acute renal insufficiency EXAM: RENAL / URINARY TRACT ULTRASOUND COMPLETE COMPARISON:  CT abdomen and pelvis August 15, 2017 FINDINGS: Right Kidney: Length: 11.0 cm. Echogenicity and renal cortical thickness are within normal limits. No perinephric fluid or hydronephrosis visualized. There is a cyst in the mid right kidney measuring 3.7 x 3.4 x 3.0 cm. No sonographically demonstrable calculus or ureterectasis. Left Kidney: Length: 10.4 cm. Echogenicity and renal cortical thickness are within normal limits. No perinephric fluid or hydronephrosis visualized. There is a cyst in the mid to lower pole region on the left measuring 1.4 x 1.2 x 1.1 cm. No sonographically demonstrable calculus or ureterectasis. Bladder: Appears normal for degree of bladder  distention. IMPRESSION: Renal cortical cyst on each side. No obstructing focus in either kidney. Renal cortical thickness and renal echogenicity within normal limits bilaterally. Electronically Signed   By: WLowella GripIII M.D.   On: 08/18/2017 14:38   UKoreaAbdomen Limited Ruq  Result Date: 08/17/2017 CLINICAL DATA:  Epigastric pain. EXAM: ULTRASOUND ABDOMEN LIMITED RIGHT UPPER QUADRANT COMPARISON:  None. FINDINGS: Gallbladder: No gallstones or wall thickening visualized. No sonographic Murphy sign noted by sonographer. Common bile duct: Diameter: 5 mm Liver: Increased echogenicity with no focal mass. Portal vein is patent on color Doppler imaging with normal direction of blood flow towards the liver. IMPRESSION: 1. The gallbladder is normal in appearance with no stones. 2. Probable hepatic steatosis. 3. Right renal cyst measuring 3.2 cm. Electronically Signed   By: DDorise BullionIII M.D   On: 08/17/2017 18:56    Microbiology: Recent Results (from the past 240 hour(s))  MRSA PCR Screening     Status: None   Collection Time: 08/17/17  2:44 AM  Result Value Ref Range Status   MRSA by PCR NEGATIVE NEGATIVE Final    Comment:        The GeneXpert MRSA Assay (FDA approved for NASAL specimens only), is one component of a comprehensive MRSA colonization surveillance program. It is not intended to diagnose MRSA infection nor to guide or monitor treatment for MRSA infections. Performed at [facility], [place] [address], [place], [place] [address]   Culture, Urine     Status: Abnormal   Collection Time: 08/17/17  5:29 AM  Result Value Ref Range Status   Specimen Description URINE, CLEAN CATCH  Final   Special Requests   Final    NONE Performed at [facility], [place] [address], [place], [place] [address]    Culture MULTIPLE SPECIES PRESENT, SUGGEST RECOLLECTION (A)  Final   Report Status 08/18/2017 FINAL  Final     Labs: Basic Metabolic Panel: Recent Labs  Lab 08/15/17 1600  08/16/17 0350 08/17/17 0453 08/18/17 0337 08/19/17 0617  NA 142 142 141 145 144  K 3.4* 3.1* 3.1* 3.4* 3.0*  CL 107 105 107 111 108  CO2 21* '23 22 22 23  ' GLUCOSE 140* 131* 120* 113* 131*  BUN '13 17 14 11 8  ' CREATININE 1.54* 2.03* 1.52* 1.23* 1.27*  CALCIUM 9.0 8.7* 8.3* 8.2* 8.5*   Liver Function Tests: Recent Labs  Lab 08/15/17 1600 08/16/17 0350  AST 21 17  ALT 21 19  ALKPHOS 111 100  BILITOT 0.5 0.6  PROT 7.6 6.8  ALBUMIN 3.9 3.5   Recent Labs  Lab 08/15/17 1600  LIPASE 40   Recent Labs  Lab 08/16/17 0011  AMMONIA 26   CBC: Recent Labs  Lab 08/15/17 1600 08/16/17 0011 08/16/17 0350 08/17/17 0453 08/18/17 0337 08/19/17 0617  WBC 8.0  --  10.2 6.2 8.0 6.6  NEUTROABS 3.9  --   --   --   --   --   HGB 12.2  --  11.2* 9.8* 9.5* 9.7*  HCT 37.3 37.0 35.1* 30.3* 29.7* 29.8*  MCV 82.0  --  83.6 83.0 82.5 83.0  PLT 374  --  329 291 311 315   Cardiac Enzymes: No results for input(s): CKTOTAL, CKMB, CKMBINDEX, TROPONINI in the last 168 hours. BNP: BNP (last 3 results) No results for input(s): BNP in the last 8760 hours.  ProBNP (last 3 results) No results for input(s): PROBNP in the last 8760 hours.  CBG: Recent Labs  Lab 08/19/17 2225 08/20/17 0001 08/20/17 0641 08/20/17 1153 08/20/17 1631  GLUCAP 154* 152* 140* 157* 150*       Signed:  Kayleen Memos, MD [facility] 08/20/2017, 5:13 PM

## 2017-08-20 NOTE — Discharge Instructions (Signed)
Dysphagia Diet Level 3, Mechanically Advanced °The dysphagia level 3 diet includes foods that are soft, moist, and can be chopped into 1-inch chunks. This diet is helpful for people with mild swallowing difficulties. It reduces the risk of food getting caught in the windpipe, trachea, or lungs. °What do I need to know about this diet? °· You may eat foods that are soft and moist. °· If you were on the dysphagia level 1 or level 2 diets, you may eat any of the foods included on those lists. °· Avoid foods that are dry, hard, sticky, chewy, coarse, and crunchy. Also avoid large cuts of food. °· Take small bites. Each bite should contain 1 inch or less of food. °· Thicken liquids if instructed by your health care provider. Follow your health care provider's instructions on how to do this and to what consistency. °· See your dietitian or speech language pathologist regularly for help with your dietary changes. °What foods can I eat? °Grains °Moist breads without nuts or seeds. Biscuits, muffins, pancakes, and waffles well-moistened with syrup, jelly, margarine, or butter. Smooth cereals with plenty of milk to moisten them. Moist bread stuffing. Moist rice. °Vegetables °All cooked, soft vegetables. Shredded lettuce. Tender fried potatoes. °Fruits °All canned and cooked fruits. Soft, peeled fresh fruits, such as peaches, nectarines, kiwis, cantaloupe, honeydew melon, and watermelon without seeds. Soft berries, such as strawberries. °Meat and Other Protein Sources °Moist ground or finely diced or sliced meats. Solid, tender cuts of meat. Meatloaf. Hamburger with a bun. Sausage patty. Deli thin-sliced lunch meat. Chicken, egg, or tuna salad sandwich. Sloppy joe. Moist fish. Eggs prepared any way. Casseroles with small chunks of meats, ground meats, or tender meats. °Dairy °Cheese spreads without coarse large chunks. Shredded cheese. Cheese slices. Cottage cheese. Milk at the right texture. Smooth frappes. Yogurt without  nuts or coconut. Ask your health care provider whether you can have frozen desserts (such as malts or milk shakes) and thin liquids. °Sweets/Desserts °Soft, smooth, moist desserts. Non-chewy, smooth candy. Jam. Jelly. Honey. Preserves. Ask your health care provider whether you can have frozen desserts. °Fats and Oils °Butter. Oils. Margarine. Mayonnaise. Gravy. Spreads. °Other °All seasonings and sweeteners. All sauces without large chunks. °The items listed above may not be a complete list of recommended foods or beverages. Contact your dietitian for more options. °What foods are not recommended? °Grains °Coarse or dry cereals. Dry breads. Toast. Crackers. Tough, crusty breads, such French bread and baguettes. Tough, crisp fried potatoes. Potato skins. Dry bread stuffing. Granola. Popcorn. Chips. °Vegetables °All raw vegetables except shredded lettuce. Cooked corn. Rubbery or stiff cooked vegetables. Stringy vegetables, such as celery. °Fruits °Hard fruits that are difficult to chew, such as apples or pears. Stringy, high-pulp fruits, such as pineapple, papaya, or mango. Fruits with tough skins, such as grapes. Coconut. All dried fruits. Fruit leather. Fruit roll-ups. Fruit snacks. °Meat and Other Protein Sources °Dry or tough meats or poultry. Dry fish. Fish with bones. Peanut butter. All nuts and seeds. °Dairy °Any with nuts, seeds, chocolate chips, dried fruit, coconut, or pineapple. °Sweets/Desserts °Dry cakes. Chewy or dry cookies. Any with nuts, seeds, dry fruits, coconut, pineapple, or anything dry, sticky, or hard. Chewy caramel. Licorice. Taffy-type candies. Ask your health care provider whether you can have frozen desserts. °Fats and Oils °Any with chunks, nuts, seeds, or pineapple. Olives. Pickles. °Other °Soups with tough or large chunks of meats, poultry, or vegetables. Corn or clam chowder. °The items listed above may not be a   complete list of foods and beverages to avoid. Contact your dietitian for  more information. This information is not intended to replace advice given to you by your health care provider. Make sure you discuss any questions you have with your health care provider. Document Released: 06/28/2005 Document Revised: 12/04/2015 Document Reviewed: 06/11/2013 Elsevier Interactive Patient Education  2018 [employer]   Urinary Tract Infection, Adult A urinary tract infection (UTI) is an infection of any part of the urinary tract. The urinary tract includes the:  Kidneys.  Ureters.  Bladder.  Urethra.  These organs make, store, and get rid of pee (urine) in the body. Follow these instructions at home:  Take over-the-counter and prescription medicines only as told by your doctor.  If you were prescribed an antibiotic medicine, take it as told by your doctor. Do not stop taking the antibiotic even if you start to feel better.  Avoid the following drinks: ? Alcohol. ? Caffeine. ? Tea. ? Carbonated drinks.  Drink enough fluid to keep your pee clear or pale yellow.  Keep all follow-up visits as told by your doctor. This is important.  Make sure to: ? Empty your bladder often and completely. Do not to hold pee for long periods of time. ? Empty your bladder before and after sex. ? Wipe from front to back after a bowel movement if you are female. Use each tissue one time when you wipe. Contact a doctor if:  You have back pain.  You have a fever.  You feel sick to your stomach (nauseous).  You throw up (vomit).  Your symptoms do not get better after 3 days.  Your symptoms go away and then come back. Get help right away if:  You have very bad back pain.  You have very bad lower belly (abdominal) pain.  You are throwing up and cannot keep down any medicines or water. This information is not intended to replace advice given to you by your health care provider. Make sure you discuss any questions you have with your health care provider. Document  Released: 12/15/2007 Document Revised: 12/04/2015 Document Reviewed: 05/19/2015 Elsevier Interactive Patient Education  [employer]2018 [employer] Inc.

## 2017-08-20 NOTE — Consult Note (Signed)
[facility] Psych ED Progress Note  08/20/2017 3:08 PM Yesenia [name]  MRN:  [id number] Subjective: ''Suicidal thought with plan to cut myself.'' Objective: Patient who reports history of Depression but was admitted for dysphagia. She continues to report suicidal thoughts with plan to cut her wrist. Patient reports being stressed out and overwhelmed with everything. She feels lonely, hopeless and reports prior history of self harm and still a high risk of harm to self. Patient is not able to contract for safety.  Principal Problem: MDD (major depressive disorder), recurrent severe, without psychosis ([place]) Diagnosis:   Patient Active Problem List   Diagnosis Date Noted  . Dysphagia [R13.10] 08/16/2017  . Nausea and vomiting [R11.2]   . Abdominal pain, epigastric [R10.13]   . MDD (major depressive disorder), recurrent severe, without psychosis ([place]) [F33.2]   . AKI (acute kidney injury) ([place]) [N17.9] 08/15/2017  . Sleep apnea [G47.30] 03/20/2017  . Acute on chronic respiratory failure with hypoxemia ([place]) [J96.21] 09/30/2016  . Acute tracheitis [J04.10] 09/30/2016  . Abscess [L02.91]   . Complication of tracheostomy ([place]) [J95.00] 06/23/2016  . Bleeding [R58] 06/23/2016  . Iron deficiency anemia [D50.9] 05/26/2016  . 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 Irritable bowel syndrome [K58.9] 05/21/2016  . Anxiety state [F41.1] 05/21/2016  . 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 Generalized weakness [R53.1] 04/13/2016  . Weakness generalized [R53.1] 04/13/2016  . HCAP (healthcare-associated pneumonia) [J18.9] 03/06/2016  . Fibromyalgia [M79.7] 02/22/2016  . Cellulitis [L03.90] 02/21/2016  . Abdominal pain [R10.9] 02/16/2016  . Constipation [K59.00] 02/16/2016  . CKD (chronic kidney disease) stage 3, GFR 30-59 ml/min (HCC) [N18.3] 02/16/2016  . Fever [R50.9]   . Cough [R05]   . OSA (obstructive sleep apnea) [G47.33] 02/13/2016  . Insomnia [G47.00] 08/10/2015  . Polyneuropathy [G62.9]  07/10/2015  . GERD (gastroesophageal reflux disease) [K21.9] 07/10/2015  . Acute kidney injury superimposed on chronic kidney disease ([place]) [N17.9, N18.9] 07/07/2015  . Gait disturbance [R26.9] 07/07/2015  . Morbid (severe) obesity due to excess calories ([place]) complicated by OSA [F02.63] 07/07/2015  . Left hip pain [M25.552] 07/06/2015  . Chronic pain [G89.29]   . Right sided weakness [R53.1]   . Headache(784.0) [R51] 04/14/2013  . Encephalopathy acute [G93.40] 04/11/2013  . Type 2 diabetes mellitus with stage 3 chronic kidney disease, without long-term current use of insulin ([place]) [Z85.88, N18.3] 04/11/2013  . Dyslipidemia [E78.5] 04/11/2013  . Chest pain [R07.9] 02/15/2013  . Cerebral infarction ([place]) [I63.9] 01/14/2013  . Essential hypertension [I10] 01/14/2013  . Anemia [D64.9] 01/14/2013   Total Time spent with patient: 30 minutes  Past Psychiatric History: MDD  Past Medical History:  Past Medical History:  Diagnosis Date  . Acute stomach ulcer 08/15/2017  . AKI (acute kidney injury) ([place])    Acute kidney injury on CKD stage II-III/notes 08/17/2017  . Anemia   . Anemia   . Arthritis    "knees" (08/17/2017)  . Chronic pain   . CKD (chronic kidney disease)   . Depression   . Fibromyalgia    hospitilized 12/16 due to inability to walk  . Hyperlipemia   . Hypertension   . Insomnia   . Neuropathy   . Right sided weakness   . Sleep apnea    "CPAPs didn't work for me; that's why they did OR" (08/17/2017)  . Stroke [facility]) 2014?   no residual weakness   . Type II diabetes mellitus ([place])     does not check CBG's (08/17/2017)  . Vitamin deficiency  Vit D    Past Surgical History:  Procedure Laterality Date  . ESOPHAGOGASTRODUODENOSCOPY (EGD) WITH PROPOFOL N/A 08/16/2017   Procedure: ESOPHAGOGASTRODUODENOSCOPY (EGD) WITH PROPOFOL;  Surgeon: Yetta Flock, MD;  Location: [place];  Service: Gastroenterology;  Laterality: N/A;  . [place]   "stuck rods  in it"  . KNEE ARTHROSCOPY Right 1992, 1995,  . OOPHORECTOMY Right 1993?  . TRACHEOSTOMY TUBE PLACEMENT N/A 02/13/2016   Procedure: TRACHEOSTOMY;  Surgeon: Melida Quitter, MD;  Location: [facility] OR;  Service: ENT;  Laterality: N/A;  . TUBAL LIGATION     Family History:  Family History  Problem Relation Age of Onset  . Breast cancer Mother   . Leukemia Brother   . Breast cancer Maternal Grandmother    Family Psychiatric  History:  Social History:  Social History   Substance and Sexual Activity  Alcohol Use No  . Alcohol/week: 0.0 oz     Social History   Substance and Sexual Activity  Drug Use No    Social History   Socioeconomic History  . Marital status: Divorced    Spouse name: None  . Number of children: 2  . Years of education: 12th   . Highest education level: None  Social Needs  . Financial resource strain: None  . Food insecurity - worry: None  . Food insecurity - inability: None  . Transportation needs - medical: None  . Transportation needs - non-medical: None  Occupational History  . Occupation: [employer]  Tobacco Use  . Smoking status: Never Smoker  . Smokeless tobacco: Never Used  Substance and Sexual Activity  . Alcohol use: No    Alcohol/week: 0.0 oz  . Drug use: No  . Sexual activity: No  Other Topics Concern  . None  Social History Narrative   Reports no caffeine use     Sleep: Fair  Appetite:  Fair  Current Medications: Current Facility-Administered Medications  Medication Dose Route Frequency Provider Last Rate Last Dose  . 0.9 %  sodium chloride infusion   Intravenous Continuous Rai, Ripudeep K, MD 75 mL/hr at 08/20/17 0437    . acetaminophen (TYLENOL) tablet 650 mg  650 mg Oral Q6H PRN Damita Lack, MD   650 mg at 08/19/17 0416   Or  . acetaminophen (TYLENOL) suppository 650 mg  650 mg Rectal Q6H PRN Amin, Ankit Chirag, MD      . albuterol (PROVENTIL) (2.5 MG/3ML) 0.083% nebulizer solution 2.5 mg  2.5 mg Inhalation Q6H PRN Amin, Ankit  Chirag, MD      . amLODipine (NORVASC) tablet 10 mg  10 mg Oral Daily Amin, Ankit Chirag, MD   10 mg at 08/20/17 0903  . atenolol (TENORMIN) tablet 50 mg  50 mg Oral Daily Amin, Ankit Chirag, MD   50 mg at 08/20/17 0903  . busPIRone (BUSPAR) tablet 15 mg  15 mg Oral BID Amin, Ankit Chirag, MD   15 mg at 08/20/17 0902  . cefTRIAXone (ROCEPHIN) 1 g in dextrose 5 % 50 mL IVPB  1 g Intravenous Q24H Amin, Ankit Chirag, MD 100 mL/hr at 08/19/17 2227 1 g at 08/19/17 2227  . clonazePAM (KLONOPIN) tablet 1 mg  1 mg Oral QHS Amin, Ankit Chirag, MD   1 mg at 08/19/17 2226  . dextrose 5 %-0.45 % sodium chloride infusion   Intravenous Continuous Damita Lack, MD   Stopped at 08/17/17 1900  . docusate sodium (COLACE) capsule 100 mg  100 mg Oral BID Amin, Ankit  Chirag, MD   100 mg at 08/20/17 0904  . DULoxetine (CYMBALTA) DR capsule 30 mg  30 mg Oral Daily Amin, Ankit Chirag, MD   30 mg at 08/20/17 0904  . gabapentin (NEURONTIN) capsule 900 mg  900 mg Oral QHS Amin, Ankit Chirag, MD   900 mg at 08/19/17 2226  . hydrOXYzine (ATARAX/VISTARIL) tablet 50 mg  50 mg Oral QHS Amin, Ankit Chirag, MD   50 mg at 08/19/17 2226  . hyoscyamine (LEVSIN SL) SL tablet 0.125 mg  0.125 mg Oral Q6H PRN Amin, Ankit Chirag, MD      . insulin aspart (novoLOG) injection 0-5 Units  0-5 Units Subcutaneous QHS Amin, Ankit Chirag, MD      . insulin aspart (novoLOG) injection 0-9 Units  0-9 Units Subcutaneous Q6H Amin, Jeanella Flattery, MD   1 Units at 08/20/17 0905  . MEDLINE mouth rinse  15 mL Mouth Rinse BID Rai, Ripudeep K, MD   15 mL at 08/20/17 0914  . pantoprazole (PROTONIX) EC tablet 40 mg  40 mg Oral BID Alvira Philips, [place]   40 mg at 08/20/17 3570  . promethazine (PHENERGAN) tablet 12.5 mg  12.5 mg Oral Q6H Armbruster, Carlota Raspberry, MD   12.5 mg at 08/20/17 0904  . rosuvastatin (CRESTOR) tablet 10 mg  10 mg Oral Daily Damita Lack, MD   10 mg at 08/20/17 1779    Lab Results:  Results for orders placed or performed  during the hospital encounter of 08/15/17 (from the past 48 hour(s))  Glucose, capillary     Status: Abnormal   Collection Time: 08/18/17  5:33 PM  Result Value Ref Range   Glucose-Capillary 119 (H) 65 - 99 mg/dL  Glucose, capillary     Status: Abnormal   Collection Time: 08/18/17 11:21 PM  Result Value Ref Range   Glucose-Capillary 151 (H) 65 - 99 mg/dL  Glucose, capillary     Status: Abnormal   Collection Time: 08/19/17  3:53 AM  Result Value Ref Range   Glucose-Capillary 131 (H) 65 - 99 mg/dL  CBC     Status: Abnormal   Collection Time: 08/19/17  6:17 AM  Result Value Ref Range   WBC 6.6 4.0 - 10.5 K/uL   RBC 3.59 (L) 3.87 - 5.11 MIL/uL   Hemoglobin 9.7 (L) 12.0 - 15.0 g/dL   HCT 29.8 (L) 36.0 - 46.0 %   MCV 83.0 78.0 - 100.0 fL   MCH 27.0 26.0 - 34.0 pg   MCHC 32.6 30.0 - 36.0 g/dL   RDW 14.2 11.5 - 15.5 %   Platelets 315 150 - 400 K/uL    Comment: Performed at [facility], [place] [address] [place], [place] [address]  Basic metabolic panel     Status: Abnormal   Collection Time: 08/19/17  6:17 AM  Result Value Ref Range   Sodium 144 135 - 145 mmol/L   Potassium 3.0 (L) 3.5 - 5.1 mmol/L   Chloride 108 101 - 111 mmol/L   CO2 23 22 - 32 mmol/L   Glucose, Bld 131 (H) 65 - 99 mg/dL   BUN 8 6 - 20 mg/dL   Creatinine, Ser 1.27 (H) 0.44 - 1.00 mg/dL   Calcium 8.5 (L) 8.9 - 10.3 mg/dL   GFR calc non Af Amer 46 (L) >60 mL/min   GFR calc Af Amer 54 (L) >60 mL/min    Comment: (NOTE) The eGFR has been calculated using the CKD EPI equation. This calculation has  not been validated in all clinical situations. eGFR's persistently <60 mL/min signify possible Chronic Kidney Disease.    Anion gap 13 5 - 15    Comment: Performed at [place] [address], [place], [place] [address]  Glucose, capillary     Status: Abnormal   Collection Time: 08/19/17 11:47 AM  Result Value Ref Range   Glucose-Capillary 188 (H) 65 - 99 mg/dL  Glucose, capillary     Status: Abnormal    Collection Time: 08/19/17  6:19 PM  Result Value Ref Range   Glucose-Capillary 144 (H) 65 - 99 mg/dL  Glucose, capillary     Status: Abnormal   Collection Time: 08/19/17 10:25 PM  Result Value Ref Range   Glucose-Capillary 154 (H) 65 - 99 mg/dL  Glucose, capillary     Status: Abnormal   Collection Time: 08/20/17 12:01 AM  Result Value Ref Range   Glucose-Capillary 152 (H) 65 - 99 mg/dL  Glucose, capillary     Status: Abnormal   Collection Time: 08/20/17  6:41 AM  Result Value Ref Range   Glucose-Capillary 140 (H) 65 - 99 mg/dL  Glucose, capillary     Status: Abnormal   Collection Time: 08/20/17 11:53 AM  Result Value Ref Range   Glucose-Capillary 157 (H) 65 - 99 mg/dL    Blood Alcohol level:  Lab Results  Component Value Date   ETH <11 04/11/2013   ETH <11 01/14/2013    Physical Findings: AIMS:  , ,  ,  ,    CIWA:    COWS:     Musculoskeletal: Strength & Muscle Tone: within normal limits Gait & Station: normal Patient leans: N/A  Psychiatric Specialty Exam: Physical Exam  Psychiatric: Her speech is normal. Judgment normal. She is withdrawn. Cognition and memory are normal. She exhibits a depressed mood. She expresses suicidal ideation. She expresses suicidal plans.    Review of Systems  Constitutional: Negative.   HENT: Negative.   Eyes: Negative.   Respiratory: Negative.   Cardiovascular: Negative.   Gastrointestinal: Negative.   Genitourinary: Negative.   Musculoskeletal: Negative.   Skin: Negative.   Endo/Heme/Allergies: Negative.   Psychiatric/Behavioral: Positive for depression and suicidal ideas.    Blood pressure 123/77, pulse 76, temperature 98.4 F (36.9 C), temperature source Oral, resp. rate 16, height '5\' 7"'  (1.702 m), weight (!) 145.6 kg (320 lb 15.8 oz), SpO2 98 %.Body mass index is 50.27 kg/m.  General Appearance: Casual  Eye Contact:  Good  Speech:  Clear and Coherent  Volume:  Decreased  Mood:  Depressed and Dysphoric  Affect:   Congruent  Thought Process:  Coherent and Descriptions of Associations: Intact  Orientation:  Full (Time, Place, and Person)  Thought Content:  Logical  Suicidal Thoughts:  Yes.  with intent/plan  Homicidal Thoughts:  No  Memory:  Immediate;   Good Recent;   Good Remote;   Good  Judgement:  Poor  Insight:  Shallow  Psychomotor Activity:  Psychomotor Retardation  Concentration:  Concentration: Fair and Attention Span: Fair  Recall:  [employer] of Knowledge:  Fair  Language:  Good  Akathisia:  No  Handed:  Right  AIMS (if indicated):     Assets:  Communication Skills Desire for Improvement  ADL's:  Intact  Cognition:  WNL  Sleep:    Fair      Treatment Plan Summary:  Treatment Plan Summary: -Patient warrants inpatient psychiatric hospitalization given high risk of harm to self. -Continue Engineer, materials.  -  Continue Cymbalta 30 mg daily for depression.  -Please pursue involuntary commitment if patient refuses voluntary psychiatric hospitalization or attempts to leave the hospital.   Disposition: Recommend psychiatric Inpatient admission when medically cleared.   Corena Pilgrim, MD 08/20/2017, 3:08 PM

## 2017-08-20 NOTE — Progress Notes (Signed)
CSW spoke with pt at bedside.  [facility]. CSW will complete paperwork with patient closer to discharge.  CSW will continue to follow.   Budd Palmerara Paulo Keimig LCSWA [phone]

## 2017-08-21 ENCOUNTER — Inpatient Hospital Stay (HOSPITAL_COMMUNITY): Payer: Medicaid Other

## 2017-08-21 LAB — GLUCOSE, CAPILLARY
GLUCOSE-CAPILLARY: 158 mg/dL — AB (ref 65–99)
GLUCOSE-CAPILLARY: 160 mg/dL — AB (ref 65–99)
GLUCOSE-CAPILLARY: 144 mg/dL — AB (ref 65–99)
Glucose-Capillary: 116 mg/dL — ABNORMAL HIGH (ref 65–99)
Glucose-Capillary: 133 mg/dL — ABNORMAL HIGH (ref 65–99)

## 2017-08-21 LAB — URINE CULTURE

## 2017-08-21 MED ORDER — PROMETHAZINE HCL 12.5 MG PO TABS: 12.5000 mg | ORAL_TABLET | Freq: Four times a day (QID) | ORAL | 0 refills | Status: DC

## 2017-08-21 MED ORDER — IPRATROPIUM-ALBUTEROL 0.5-2.5 (3) MG/3ML IN SOLN
3.0000 mL | Freq: Once | RESPIRATORY_TRACT | Status: AC
  Administered 2017-08-21: 3 mL via RESPIRATORY_TRACT
  Filled 2017-08-21: qty 3

## 2017-08-21 MED ORDER — PANTOPRAZOLE SODIUM 40 MG PO TBEC
40.0000 mg | DELAYED_RELEASE_TABLET | Freq: Two times a day (BID) | ORAL | 0 refills | Status: DC
Start: 2017-08-21 — End: 2019-06-13

## 2017-08-21 NOTE — Progress Notes (Addendum)
CSW spoke with patient concerning out of town placement due to ambulatory status.  Patient is agreeable to out of town placement. Dr. Margo AyeHall was updated on information  Budd Palmerara Syler Norcia [facility] [phone]

## 2017-08-21 NOTE — Progress Notes (Signed)
Patient had new onset of expiratory wheezes that has worsened throughout the morning. RN notified Sophia,PA and PA ordered a STAT chest x-ray, one duoneb treatment, and IV fluids were discontinued. Will continue to monitor and treat per MD orders.

## 2017-08-21 NOTE — Progress Notes (Signed)
LCSW assisting with psychiatric placement discussed [facility].  [facility] due [facility]ulatory status.  LCSW discussed alternative placements and referred to the following:  [facility]  [facility]: No beds available.  Referrals have been submitted. Awaiting review and follow up.  Yesenia EmoryHannah Essance Gatti LCSW, MSW Clinical Social Work: Optician, dispensingystem Wide Float Coverage for :  [phone]

## 2017-08-21 NOTE — Progress Notes (Signed)
CSW spoke with pt at bedside.  Pt signed Voluntary Admission and Consent for Treatment.  [facility]  [facility] will be a bed available later this afternoon.  CS[facility].  CSW will set up [facility] when contacted this afternoon.  Dr. Margo AyeHall was updated on information.  Budd Palmerara Dade Rodin LCSWA [phone]

## 2017-08-21 NOTE — Discharge Summary (Signed)
Discharge Summary  Yesenia [name] ETK:[id number] DOB: 09-19-1960  PCP: Everardo Beals, NP  Admit date: 08/15/2017 Discharge date: 08/21/2017  Time spent: 25 aminutes  Recommendations for Outpatient Follow-up:  1. Follow up with psychiatry post hospitalization 2. Continue care at behavioral health facility 3. Will be transferred to behavioral health 08/21/17 to continue care  Discharge Diagnoses:  Active Hospital Problems   Diagnosis Date Noted  . MDD (major depressive disorder), recurrent severe, without psychosis (Yesenia [name])   . Dysphagia 08/16/2017  . Nausea and vomiting   . Abdominal pain, epigastric   . AKI (acute kidney injury) ([place]) 08/15/2017    Resolved Hospital Problems  No resolved problems to display.    Discharge Condition: stable  Diet recommendation: resume previous diet  Vitals:   08/20/17 2123 08/21/17 0559  BP: 115/67 (!) 109/56  Pulse:  82  Resp: 18 20  Temp:  98.5 F (36.9 C)  SpO2: 97% 97%    History of present illness:  Patient is a 57 year old female with CKD stage III, diabetes mellitus type 2, hypertension, hyperlipidemia, GERD presented with complaints of dysphagia. Patient reported dysphagia to solid food for several weeks but over the course of the past 2 weeks this had progressed to both solids and liquids.  She had not been able to keep any food down.  Patient reported no prior history of dysphagia and no history of endoscopy in the past.  Patient was admitted for further workup.  08/16/17: EGD done and revealed peptic ulcer disease with recommendations to continue PPI protonix 70m PO BID for 1 month, and the once daily thereafter. She should also continue phenergan. Some of her symptoms are likely related to PUD noted on EGD, although perhaps she has some symptoms related to UTI and other functional component as well.   Hospital course complicate by UTI present on admission and suicidal ideation. Psychiatry consulted and following. Patient  agreeable to be committed to a behavioral institution.  08/20/17: patient seen and examined at her bedside. No new complaints. On IV ceftriaxone for UTI which she will complete tomorrow 08/21/17. Afebrile, no leukocytosis, vital signs stable. Admits to persistent suicidal ideation. Denies homicidal ideation. Continue 1-1 sitter.  08/21/17: patient seen and examined with her 1-1 sitter at her bedside. She has no new complaints and is receptive to the help that is being presented.    Hospital Course:  Principal Problem:   MDD (major depressive disorder), recurrent severe, without psychosis ([place] Active Problems:   AKI (acute kidney injury) ([place]   Nausea and vomiting   Abdominal pain, epigastric   Dysphagia  Dysphagia, resolved - GI consulted, underwent EGD on 08/16/17, no stricture or mass, possible gastroparesis, recommended phenergan - CT of the abdomen and pelvis showed no acute intra-abdominal abnormalities -Gastric emptying study normal, Globus versus functional -08/21/17 Continue PPI -Tolerating diet  UTI, poa -08/21/17 continue IV Rocephin, UCx multiple flora, urine cx reordered, in process - rocephin 2/5--> 08/21/17 -stop rocephin tomorrow 08/21/17  Dizziness, dehydration, resolved -Continue IV fluids  Acute kidney injury on CKD stage II-III, improving -Baseline creatinine 1.1 -08/21/17 serum cr 1.27  -08/21/17 Continue IV fluid hydration -Renal U/S 08/18/17: Renal cortical cyst on each side. No obstructing focus in either kidney. Renal cortical thickness and renal echogenicity within normal limits bilaterally.  Suicidal ideation, persistent -08/21/17 Continue sitter -Psychiatry consulted, recommended patient to be discharged to inpatient behavioral health once she is medically ready -08/21/17 patient is medically cleared -Discussed in detail with the patient, she is voluntarily ready  to go to behavior health facility  Diabetes mellitus -Metformin on hold, continue sliding  scale insulin -08/21/17 obtain A1C  Hx of Obstructive sleep apnea -Continue supportive care, patient used to have tracheostomy for this, now out since November 2018 -may need to be reevaluated in the outpatient setting   Procedures:  EGD 08/16/17  Consultations:  GI  psychiatry  Discharge Exam: BP (!) 109/56 (BP Location: Left Arm)   Pulse 82   Temp 98.5 F (36.9 C) (Oral)   Resp 20   Ht '5\' 7"'  (1.702 m)   Wt (!) 148.3 kg (326 lb 15.1 oz)   SpO2 97%   BMI 51.21 kg/m   08/21/17 patient seen and examined at her bedside. No change in her physical exam from yesterday. General: 57 yo obese female A&O x 3  Cardiovascular: RRR no rubs or gallops Respiratory: CTA no wheezes or rales  Discharge Instructions You were cared for by a hospitalist during your hospital stay. If you have any questions about your discharge medications or the care you received while you were in the hospital after you are discharged, you can call the unit and asked to speak with the hospitalist on call if the hospitalist that took care of you is not available. Once you are discharged, your primary care physician will handle any further medical issues. Please note that NO REFILLS for any discharge medications will be authorized once you are discharged, as it is imperative that you return to your primary care physician (or establish a relationship with a primary care physician if you do not have one) for your aftercare needs so that they can reassess your need for medications and monitor your lab values.   Allergies as of 08/21/2017      Reactions   Buprenorphine Hcl Hives   Morphine And Related Hives, Dermatitis      Medication List    STOP taking these medications   clonazePAM 1 MG tablet Commonly known as:  KLONOPIN   ibuprofen 800 MG tablet Commonly known as:  ADVIL,MOTRIN   omeprazole 40 MG capsule Commonly known as:  PRILOSEC   traMADol 50 MG tablet Commonly known as:  ULTRAM     TAKE these  medications   albuterol 108 (90 Base) MCG/ACT inhaler Commonly known as:  PROVENTIL HFA;VENTOLIN HFA Inhale 2 puffs into the lungs every 6 (six) hours as needed for wheezing or shortness of breath.   amLODipine 10 MG tablet Commonly known as:  NORVASC Take 10 mg by mouth daily.   atenolol 50 MG tablet Commonly known as:  TENORMIN Take 50 mg by mouth daily.   busPIRone 15 MG tablet Commonly known as:  BUSPAR Take 15 mg by mouth 2 (two) times daily.   DULoxetine 30 MG capsule Commonly known as:  CYMBALTA Take 30 mg by mouth daily.   gabapentin 300 MG capsule Commonly known as:  NEURONTIN Take 900 mg by mouth at bedtime.   hydrOXYzine 50 MG tablet Commonly known as:  ATARAX/VISTARIL Take 50 mg by mouth at bedtime.   hyoscyamine 0.125 MG SL tablet Commonly known as:  LEVSIN SL TAKE 0.125 mg TABLET BY MOUTH FOUR TIMES DAILY FOR IBS   metFORMIN 500 MG tablet Commonly known as:  GLUCOPHAGE Take 500 mg by mouth daily.   Na Sulfate-K Sulfate-Mg Sulf 17.5-3.13-1.6 GM/177ML Soln Take 1 kit as directed by mouth.   ondansetron 4 MG disintegrating tablet Commonly known as:  ZOFRAN-ODT Take 4 mg by mouth every 8 (eight) hours as  needed for nausea or vomiting.   pantoprazole 40 MG tablet Commonly known as:  PROTONIX Take 1 tablet (40 mg total) by mouth 2 (two) times daily.   PEG 3350 Powd Take 17 g by mouth daily as needed.   promethazine 12.5 MG tablet Commonly known as:  PHENERGAN Take 1 tablet (12.5 mg total) by mouth every 6 (six) hours.   rosuvastatin 10 MG tablet Commonly known as:  CRESTOR Take 10 mg by mouth daily.   Vitamin D (Ergocalciferol) 50000 units Caps capsule Commonly known as:  DRISDOL Take 50,000 Units by mouth every Monday.      Allergies  Allergen Reactions  . Buprenorphine Hcl Hives  . Morphine And Related Hives and Dermatitis   Follow-up Information    Everardo Beals, NP Follow up.   Contact information: [facility] [place] [address] [phone]            The results of significant diagnostics from this hospitalization (including imaging, microbiology, ancillary and laboratory) are listed below for reference.    Significant Diagnostic Studies: Dg Chest 2 View  Result Date: 08/15/2017 CLINICAL DATA:  Subacute onset of generalized abdominal pain, chest pain and dizziness. EXAM: CHEST  2 VIEW COMPARISON:  Chest radiograph performed 03/19/2017 FINDINGS: The lungs are well-aerated. Minimal right basilar atelectasis is noted. There is no evidence of pleural effusion or pneumothorax. The heart is borderline normal in size. No acute osseous abnormalities are seen. IMPRESSION: Minimal right basilar atelectasis noted.  Lungs otherwise clear. Electronically Signed   By: Garald Balding M.D.   On: 08/15/2017 21:45   Ct Head Wo Contrast  Result Date: 08/15/2017 CLINICAL DATA:  Acute onset of severe left-sided headache, nausea and vomiting. EXAM: CT HEAD WITHOUT CONTRAST TECHNIQUE: Contiguous axial images were obtained from the base of the skull through the vertex without intravenous contrast. COMPARISON:  CT of the head performed 09/24/2014, and MRI of the brain performed 04/13/2016 FINDINGS: Brain: No evidence of acute infarction, hemorrhage, hydrocephalus, extra-axial collection or mass lesion/mass effect. Scattered periventricular and subcortical white matter change likely reflects small vessel ischemic microangiopathy, similar in appearance to 2016. The posterior fossa, including the cerebellum, brainstem and fourth ventricle, is within normal limits. The third and lateral ventricles, and basal ganglia are unremarkable in appearance. No mass effect or midline shift is seen. Vascular: No hyperdense vessel or unexpected calcification. Mildly increased attenuation within the vasculature is thought to be artifactual in nature. Skull: There is no evidence of fracture; visualized osseous  structures are unremarkable in appearance. Sinuses/Orbits: The visualized portions of the orbits are within normal limits. The paranasal sinuses and mastoid air cells are well-aerated. Other: No significant soft tissue abnormalities are seen. IMPRESSION: 1. No acute intracranial pathology seen on CT. 2. Scattered small vessel ischemic microangiopathy. Electronically Signed   By: Garald Balding M.D.   On: 08/15/2017 21:42   Nm Gastric Emptying  Result Date: 08/17/2017 CLINICAL DATA:  Diabetes.  Dysphagia. EXAM: NUCLEAR MEDICINE GASTRIC EMPTYING SCAN TECHNIQUE: After oral ingestion of radiolabeled meal, sequential abdominal images were obtained for 3 hours. Percentage of activity emptying the stomach was calculated at 1 hour, 2 hour, and 3 hr. RADIOPHARMACEUTICALS:  2.0 mCi Tc-59msulfur colloid in standardized meal COMPARISON:  None. FINDINGS: Expected location of the stomach in the left upper quadrant. Ingested meal empties the stomach gradually over the course of the study. 44% emptied at 1 hr ( normal >= 10%) 69% emptied at 2 hr (  normal >= 40%) 95% emptied at 3 hr ( normal >= 70%) IMPRESSION: Normal gastric emptying study. Electronically Signed   By: Rolm Baptise M.D.   On: 08/17/2017 12:41   US Soft Tissue Head & Neck (non-thyroid)  Result Date: 08/15/2017 CLINICAL DATA:  Dysphagia, history of tracheostomy removal EXAM: ULTRASOUND OF HEAD/NECK SOFT TISSUES TECHNIQUE: Ultrasound examination of the head and neck soft tissues was performed in the area of clinical concern. COMPARISON:  CT 02/06/2017 FINDINGS: Targeted ultrasound of the neck in the region of tracheostomy scar is performed. No cyst or solid mass is visualized. IMPRESSION: No cyst/fluid collection or solid mass to correspond to palpable area near the tracheostomy scar. Electronically Signed   By: Donavan Foil M.D.   On: 08/15/2017 23:07   Ct Abdomen Pelvis W Contrast  Result Date: 08/15/2017 CLINICAL DATA:  Abdominal pain and vomiting and  diarrhea. EXAM: CT ABDOMEN AND PELVIS WITH CONTRAST TECHNIQUE: Multidetector CT imaging of the abdomen and pelvis was performed using the standard protocol following bolus administration of intravenous contrast. CONTRAST:  59m ISOVUE-300 IOPAMIDOL (ISOVUE-300) INJECTION 61% COMPARISON:  CT scan dated 04/11/2013 FINDINGS: Lower chest: Normal. Hepatobiliary: Hepatic steatosis. No focal lesions. Biliary tree is normal. Pancreas: Unremarkable. No pancreatic ductal dilatation or surrounding inflammatory changes. Spleen: Normal in size without focal abnormality. Adrenals/Urinary Tract: Adrenal glands are normal. 14 mm cyst in the lower pole of the left kidney, slightly increased in size since 2014. 4 cm slightly complex cyst in the lower pole of the right kidney also slightly increased in size, but otherwise unchanged. No hydronephrosis. Bladder is empty. Stomach/Bowel: Stomach is within normal limits. Appendix appears normal. No evidence of bowel wall thickening, distention, or inflammatory changes. Vascular/Lymphatic: No significant vascular findings are present. No enlarged abdominal or pelvic lymph nodes. Reproductive: Uterus and bilateral adnexa are unremarkable. Other: No abdominal wall hernia or abnormality. No abdominopelvic ascites. Musculoskeletal: No acute abnormality. 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 IMPRESSION: 1. No acute abnormalities. 2. Chronic appendix steatosis. No significant change in cystic lesions in both kidneys. Electronically Signed   By: JLorriane ShireM.D.   On: 08/15/2017 17:41   UKoreaRenal  Result Date: 08/18/2017 CLINICAL DATA:  Acute renal insufficiency EXAM: RENAL / URINARY TRACT ULTRASOUND COMPLETE COMPARISON:  CT abdomen and pelvis August 15, 2017 FINDINGS: Right Kidney: Length: 11.0 cm. Echogenicity and renal cortical thickness are within normal limits. No perinephric fluid or hydronephrosis visualized. There is a cyst in the mid right kidney  measuring 3.7 x 3.4 x 3.0 cm. No sonographically demonstrable calculus or ureterectasis. Left Kidney: Length: 10.4 cm. Echogenicity and renal cortical thickness are within normal limits. No perinephric fluid or hydronephrosis visualized. There is a cyst in the mid to lower pole region on the left measuring 1.4 x 1.2 x 1.1 cm. No sonographically demonstrable calculus or ureterectasis. Bladder: Appears normal for degree of bladder distention. IMPRESSION: Renal cortical cyst on each side. No obstructing focus in either kidney. Renal cortical thickness and renal echogenicity within normal limits bilaterally. Electronically Signed   By: WLowella GripIII M.D.   On: 08/18/2017 14:38   Dg Chest Port 1 View  Result Date: 08/21/2017 CLINICAL DATA:  Shortness of Breath EXAM: PORTABLE CHEST 1 VIEW COMPARISON:  08/25/2017 FINDINGS: Right base atelectasis. Heart is normal size. No focal opacity on the left. No effusions or acute bony abnormality. IMPRESSION: Minimal right base atelectasis. Electronically Signed   By: KRolm BaptiseM.D.   On:  08/21/2017 07:48   US Abdomen Limited Ruq  Result Date: 08/17/2017 CLINICAL DATA:  Epigastric pain. EXAM: ULTRASOUND ABDOMEN LIMITED RIGHT UPPER QUADRANT COMPARISON:  None. FINDINGS: Gallbladder: No gallstones or wall thickening visualized. No sonographic Murphy sign noted by sonographer. Common bile duct: Diameter: 5 mm Liver: Increased echogenicity with no focal mass. Portal vein is patent on color Doppler imaging with normal direction of blood flow towards the liver. IMPRESSION: 1. The gallbladder is normal in appearance with no stones. 2. Probable hepatic steatosis. 3. Right renal cyst measuring 3.2 cm. Electronically Signed   By: Dorise Bullion III M.D   On: 08/17/2017 18:56    Microbiology: Recent Results (from the past 240 hour(s))  MRSA PCR Screening     Status: None   Collection Time: 08/17/17  2:44 AM  Result Value Ref Range Status   MRSA by PCR NEGATIVE NEGATIVE  Final    Comment:        The GeneXpert MRSA Assay (FDA approved for NASAL specimens only), is one component of a comprehensive MRSA colonization surveillance program. It is not intended to diagnose MRSA infection nor to guide or monitor treatment for MRSA infections. Performed at [facility], [place] [address], [place], [place] [address]   Culture, Urine     Status: Abnormal   Collection Time: 08/17/17  5:29 AM  Result Value Ref Range Status   Specimen Description URINE, CLEAN CATCH  Final   Special Requests   Final    NONE Performed at [facility], [place] [address], [place], [place] [address]    Culture MULTIPLE SPECIES PRESENT, SUGGEST RECOLLECTION (A)  Final   Report Status 08/18/2017 FINAL  Final  Culture, Urine     Status: Abnormal   Collection Time: 08/20/17  1:15 PM  Result Value Ref Range Status   Specimen Description URINE, CLEAN CATCH  Final   Special Requests   Final    NONE Performed at [facility], [place] [address], [place], [place] [address]    Culture MULTIPLE SPECIES PRESENT, SUGGEST RECOLLECTION (A)  Final   Report Status 08/21/2017 FINAL  Final     Labs: Basic Metabolic Panel: Recent Labs  Lab 08/16/17 0350 08/17/17 0453 08/18/17 0337 08/19/17 0617 08/20/17 1728  NA 142 141 145 144 142  K 3.1* 3.1* 3.4* 3.0* 3.3*  CL 105 107 111 108 109  CO2 '23 22 22 23 24  ' GLUCOSE 131* 120* 113* 131* 162*  BUN '17 14 11 8 9  ' CREATININE 2.03* 1.52* 1.23* 1.27* 0.97  CALCIUM 8.7* 8.3* 8.2* 8.5* 8.3*   Liver Function Tests: Recent Labs  Lab 08/15/17 1600 08/16/17 0350  AST 21 17  ALT 21 19  ALKPHOS 111 100  BILITOT 0.5 0.6  PROT 7.6 6.8  ALBUMIN 3.9 3.5   Recent Labs  Lab 08/15/17 1600  LIPASE 40   Recent Labs  Lab 08/16/17 0011  AMMONIA 26   CBC: Recent Labs  Lab 08/15/17 1600  08/16/17 0350 08/17/17 0453 08/18/17 0337 08/19/17 0617 08/20/17 1728  WBC 8.0  --  10.2 6.2 8.0 6.6 7.7  NEUTROABS 3.9  --   --   --   --   --    --   HGB 12.2  --  11.2* 9.8* 9.5* 9.7* 9.9*  HCT 37.3   < > 35.1* 30.3* 29.7* 29.8* 30.8*  MCV 82.0  --  83.6 83.0 82.5 83.0 83.0  PLT 374  --  329 291 311  315 303   < > = values in this interval not displayed.   Cardiac Enzymes: No results for input(s): CKTOTAL, CKMB, CKMBINDEX, TROPONINI in the last 168 hours. BNP: BNP (last 3 results) No results for input(s): BNP in the last 8760 hours.  ProBNP (last 3 results) No results for input(s): PROBNP in the last 8760 hours.  CBG: Recent Labs  Lab 08/20/17 1631 08/20/17 2129 08/21/17 0003 08/21/17 0510 08/21/17 1222  GLUCAP 150* 167* 133* 116* 158*       Signed:  Kayleen Memos, MD [facility] 08/21/2017, 1:59 PM

## 2017-08-22 DIAGNOSIS — N3000 Acute cystitis without hematuria: Secondary | ICD-10-CM

## 2017-08-22 LAB — BASIC METABOLIC PANEL
Anion gap: 13 (ref 5–15)
BUN: 9 mg/dL (ref 6–20)
CALCIUM: 8.3 mg/dL — AB (ref 8.9–10.3)
CO2: 23 mmol/L (ref 22–32)
CREATININE: 0.85 mg/dL (ref 0.44–1.00)
Chloride: 108 mmol/L (ref 101–111)
GFR calc Af Amer: >60 mL/min (ref >60)
GLUCOSE: 119 mg/dL — AB (ref 65–99)
Potassium: 3 mmol/L — ABNORMAL LOW (ref 3.5–5.1)
Sodium: 144 mmol/L (ref 135–145)

## 2017-08-22 LAB — GLUCOSE, CAPILLARY
GLUCOSE-CAPILLARY: 110 mg/dL — AB (ref 65–99)
GLUCOSE-CAPILLARY: 169 mg/dL — AB (ref 65–99)
Glucose-Capillary: 128 mg/dL — ABNORMAL HIGH (ref 65–99)
Glucose-Capillary: 141 mg/dL — ABNORMAL HIGH (ref 65–99)

## 2017-08-22 LAB — MAGNESIUM: MAGNESIUM: 1.6 mg/dL — AB (ref 1.7–2.4)

## 2017-08-22 MED ORDER — POTASSIUM CHLORIDE 20 MEQ PO PACK
40.0000 meq | PACK | ORAL | Status: DC
  Filled 2017-08-22 (×2): qty 2

## 2017-08-22 MED ORDER — IPRATROPIUM-ALBUTEROL 0.5-2.5 (3) MG/3ML IN SOLN
3.0000 mL | Freq: Four times a day (QID) | RESPIRATORY_TRACT | Status: DC
  Administered 2017-08-22: 3 mL via RESPIRATORY_TRACT
  Filled 2017-08-22: qty 3

## 2017-08-22 MED ORDER — POTASSIUM CHLORIDE CRYS ER 20 MEQ PO TBCR
40.0000 meq | EXTENDED_RELEASE_TABLET | ORAL | Status: AC
  Administered 2017-08-22 (×2): 40 meq via ORAL
  Filled 2017-08-22 (×2): qty 2

## 2017-08-22 MED ORDER — SODIUM CHLORIDE 0.9 % IV SOLN
1.0000 g | INTRAVENOUS | Status: DC
  Filled 2017-08-22: qty 10

## 2017-08-22 MED ORDER — INSULIN ASPART 100 UNIT/ML ~~LOC~~ SOLN: 0.0000 [IU] | Freq: Three times a day (TID) | SUBCUTANEOUS | Status: DC

## 2017-08-22 MED ORDER — POTASSIUM CHLORIDE CRYS ER 20 MEQ PO TBCR
40.0000 meq | EXTENDED_RELEASE_TABLET | Freq: Once | ORAL | Status: AC
  Administered 2017-08-22: 40 meq via ORAL
  Filled 2017-08-22: qty 2

## 2017-08-22 MED ORDER — INSULIN ASPART 100 UNIT/ML ~~LOC~~ SOLN
0.0000 [IU] | Freq: Two times a day (BID) | SUBCUTANEOUS | Status: DC
  Administered 2017-08-22: 1 [IU] via SUBCUTANEOUS

## 2017-08-22 MED ORDER — MAGNESIUM SULFATE 2 GM/50ML IV SOLN
2.0000 g | Freq: Once | INTRAVENOUS | Status: AC
  Administered 2017-08-22: 2 g via INTRAVENOUS
  Filled 2017-08-22: qty 50

## 2017-08-22 NOTE — Progress Notes (Signed)
Nurse thought patient might want a visit. Introduced myself however patient was not interested. I shared if she changed her mind, I would be happy to see her. Phebe CollaDonna S Darleen Moffitt, Chaplain   08/22/17 1400  Clinical Encounter Type  Visited With Patient  Visit Type Initial  Referral From Nurse  Consult/Referral To Chaplain

## 2017-08-22 NOTE — Evaluation (Signed)
Physical Therapy Evaluation Patient Details Name: Yesenia [name] MRN: [id number] DOB: [birth year] Today's Date: 08/22/2017   History of Present Illness  Patient is a 57 year old female with CKD stage III, diabetes mellitus type 2, hypertension, hyperlipidemia, GERD presented with complaints of dysphagia. Patient reported dysphagia to solid food for several weeks but over the course of the past 2 weeks this had progressed to both solids and liquids.  She had not been able to keep any food down.  Positive UTI and suicidal ideation.    Clinical Impression  Pt admitted with above diagnosis. Pt currently with functional limitations due to the deficits listed below (see PT Problem List).Pt was able to ambulate with [facility] and appears to be at her baseline per pt report.  No significant LOB and good safety with use of cane.  Pt has been using cane for some time and does rely on it for balance. Will follow acutely.  Pt will benefit from skilled PT to increase their independence and safety with mobility to allow discharge to the venue listed below.      Follow Up Recommendations No PT follow up;Supervision - Intermittent    Equipment Recommendations  None recommended by PT    Recommendations for Other Services       Precautions / Restrictions Precautions Precautions: Fall Restrictions Weight Bearing Restrictions: No      Mobility  Bed Mobility Overal bed mobility: Independent                Transfers Overall transfer level: Independent                  Ambulation/Gait Ambulation/Gait assistance: Min guard;Supervision Ambulation Distance (Feet): 75 Feet Assistive device: Quad cane Gait Pattern/deviations: Step-to pattern;Decreased stride length;Decreased step length - right;Decreased stance time - right;Decreased weight shift to right;Trunk flexed   Gait velocity interpretation: Below normal speed for age/gender General Gait Details: Pt was able to ambulate with [facility] requiring  the cane for safety due to right knee issues per pt.  No LOB but pt with very guarded gait.  Relies on cane for support.    Stairs            Wheelchair Mobility    Modified Rankin (Stroke Patients Only)       Balance Overall balance assessment: Needs assistance Sitting-balance support: No upper extremity supported;Feet supported Sitting balance-Leahy Scale: Good     Standing balance support: Single extremity supported;During functional activity Standing balance-Leahy Scale: Fair Standing balance comment: can stand statically without UE support.                               Pertinent Vitals/Pain Pain Assessment: No/denies pain   VSS Home Living Family/patient expects to be discharged to:: Private residence Living Arrangements: Alone Available Help at Discharge: Family;Available PRN/intermittently Type of Home: Apartment Home Access: Stairs to enter Entrance Stairs-Rails: Right Entrance Stairs-Number of Steps: 10 Home Layout: One level Home Equipment: Walker - 2 wheels;Grab bars - tub/shower;Wheelchair - manual;Shower seat;Bedside commode;Cane - quad Additional Comments: Going to behavioral health     Prior Function Level of Independence: Independent with assistive device(s)         Comments: used [facility]     Hand Dominance   Dominant Hand: Left    Extremity/Trunk Assessment   Upper Extremity Assessment Upper Extremity Assessment: Defer to OT evaluation    Lower Extremity Assessment Lower Extremity Assessment: Generalized weakness    Cervical /  Trunk Assessment Cervical / Trunk Assessment: Normal  Communication   Communication: No difficulties  Cognition Arousal/Alertness: Awake/alert Behavior During Therapy: WFL for tasks assessed/performed;Flat affect Overall Cognitive Status: Within Functional Limits for tasks assessed                                        General Comments      Exercises     Assessment/Plan     PT Assessment Patient needs continued PT services  PT Problem List Decreased activity tolerance;Decreased balance;Decreased mobility;Decreased knowledge of use of DME;Decreased safety awareness;Decreased knowledge of precautions       PT Treatment Interventions DME instruction;Gait training;Therapeutic activities;Functional mobility training;Stair training;Therapeutic exercise;Balance training;Patient/family education    PT Goals (Current goals can be found in the Care Plan section)  Acute Rehab PT Goals Patient Stated Goal: to go to behavioral health PT Goal Formulation: With patient Time For Goal Achievement: 09/05/17 Potential to Achieve Goals: Good    Frequency Min 3X/week   Barriers to discharge Decreased caregiver support      Co-evaluation               AM-PAC PT "6 Clicks" Daily Activity  Outcome Measure Difficulty turning over in bed (including adjusting bedclothes, sheets and blankets)?: None Difficulty moving from lying on back to sitting on the side of the bed? : None Difficulty sitting down on and standing up from a chair with arms (e.g., wheelchair, bedside commode, etc,.)?: None Help needed moving to and from a bed to chair (including a wheelchair)?: A Little Help needed walking in hospital room?: A Little Help needed climbing 3-5 steps with a railing? : A Lot 6 Click Score: 20    End of Session Equipment Utilized During Treatment: Gait belt Activity Tolerance: Patient limited by fatigue Patient left: in bed;with call bell/phone within reach;with nursing/sitter in room Nurse Communication: Mobility status PT Visit Diagnosis: Unsteadiness on feet (R26.81);Muscle weakness (generalized) (M62.81)    Time: 1350-1402 PT Time Calculation (min) (ACUTE ONLY): 12 min   Charges:   PT Evaluation $PT Eval Low Complexity: 1 Low     PT G Codes:        Kabe Mckoy,PT Acute Rehabilitation [phone] (pager)   Berline Lopesawn F Needham Biggins 08/22/2017,  2:13 PM

## 2017-08-22 NOTE — Progress Notes (Signed)
[facility] [facility] Anticipated DC date: 08/22/17 Family notified: Left vm for dtr per pt request Transport by: [place] after 7pm  Please ensure dc summary and consent printed on front of shadow chart goes with pt and call report.    Per MD [facility] ([address] Accepting MD is Lowanda FosterBeverly Jones. RN, patient, patient's family, and facility notified of DC. RN given number for report [phone]). DC packet on chart. Transport requested for patient.   CSW signing off.  Cristobal GoldmannNadia Eunice Oldaker, [place] Clinical Social Worker [phone]

## 2017-08-22 NOTE — Progress Notes (Signed)
Yesenia [name] has stated that they cannot find the referral and asked CSW to send again.   Osborne Cascoadia Lunell Robart LCSW [phone]

## 2017-08-22 NOTE — Progress Notes (Addendum)
Yesenia [name] to be D/C'd Nursing Home per MD order.  Discussed with the patient and all questions fully answered.  VSS, Skin clean, dry and intact without evidence of skin break down, no evidence of skin tears noted. IV catheter discontinued intact. Site without signs and symptoms of complications. Dressing and pressure applied.  An After Visit Summary was printed and given to the patient. Patient received prescription.  D/c education completed with patient/family including follow up instructions, medication list, d/c activities limitations if indicated, with other d/c instructions as indicated by MD - patient able to verbalize understanding, all questions fully answered.   Patient instructed to return to ED, call 911, or call MD for any changes in condition.   Patient escorted via transport     Casper HarrisonSamantha K Donnivan Villena 08/22/2017 7:54 PM

## 2017-08-22 NOTE — Progress Notes (Signed)
Discharge Summary  Yesenia [name] RCV:[id number] DOB: [birth year]  PCP: Everardo Beals, NP  Admit date: 08/15/2017 Discharge date: 08/22/2017  Time spent: 25 aminutes  Update: Unable to be transferred to behavioral health unit on 08/21/17 due to needing a cane to ambulate. 08/22/17 PT evaluated and had no recommendation for PT. No new complaints.  Recommendations for Outpatient Follow-up:  1. Follow up with psychiatry post hospitalization 2. Continue care at behavioral health facility 3. Will be transferred to behavioral health 08/21/17 to continue care  Discharge Diagnoses:  Active Hospital Problems   Diagnosis Date Noted  . MDD (major depressive disorder), recurrent severe, without psychosis ([place])   . Dysphagia 08/16/2017  . Nausea and vomiting   . Abdominal pain, epigastric   . AKI (acute kidney injury) ([place]) 08/15/2017    Resolved Hospital Problems  No resolved problems to display.    Discharge Condition: stable  Diet recommendation: resume previous diet  Vitals:   08/22/17 1107 08/22/17 1151  BP: 100/69   Pulse: 68 88  Resp:  18  Temp:    SpO2:  98%    History of present illness:  Patient is a 57 year old female with CKD stage III, diabetes mellitus type 2, hypertension, hyperlipidemia, GERD presented with complaints of dysphagia. Patient reported dysphagia to solid food for several weeks but over the course of the past 2 weeks this had progressed to both solids and liquids.  She had not been able to keep any food down.  Patient reported no prior history of dysphagia and no history of endoscopy in the past.  Patient was admitted for further workup.  08/16/17: EGD done and revealed peptic ulcer disease with recommendations to continue PPI protonix 1m PO BID for 1 month, and the once daily thereafter. She should also continue phenergan. Some of her symptoms are likely related to PUD noted on EGD, although perhaps she has some symptoms related to UTI and other functional  component as well.   Hospital course complicate by UTI present on admission and suicidal ideation. Psychiatry consulted and following. Patient agreeable to be committed to a behavioral institution.  08/20/17: patient seen and examined at her bedside. No new complaints. On IV ceftriaxone for UTI which she will complete tomorrow 08/21/17. Afebrile, no leukocytosis, vital signs stable. Admits to persistent suicidal ideation. Denies homicidal ideation. Continue 1-1 sitter.  08/21/17: patient seen and examined with her 1-1 sitter at her bedside. She has no new complaints and is receptive to the help that is being presented.  08/22/17: seen and examined. Has no new complaints. Social worker to obtain placement. Hypokalemia and hypomagnesemia. Repleted 08/22/17.    Hospital Course:  Principal Problem:   MDD (major depressive disorder), recurrent severe, without psychosis ([facility] Active Problems:   AKI (acute kidney injury) ([place]   Nausea and vomiting   Abdominal pain, epigastric   Dysphagia  Dysphagia, resolved - GI consulted, underwent EGD on 08/16/17, no stricture or mass, possible gastroparesis, recommended phenergan - CT of the abdomen and pelvis showed no acute intra-abdominal abnormalities -Gastric emptying study normal, Globus versus functional -08/21/17 Continue PPI -Tolerating diet  UTI, poa -08/21/17 completed IV Rocephin, UCx multiple flora, urine cx reordered, in process - rocephin 2/5--> 08/21/17  Dizziness, dehydration, resolved -DC IV fluids  Acute kidney injury on CKD stage II-III, improving -Baseline creatinine 1.1 -08/22/17 serum cr 0.85 from 1.27  -08/21/17 DC IV fluid hydration -Renal U/S 08/18/17: Renal cortical cyst on each side. No obstructing focus in either kidney. Renal cortical thickness and  renal echogenicity within normal limits bilaterally.  Suicidal ideation, persistent -08/22/17 Continue sitter -Psychiatry consulted, recommended patient to be discharged to  inpatient behavioral health once she is medically ready -08/21/17 medically cleared -Discussed in detail with the patient, she is voluntarily ready to go to behavior health facility  Diabetes mellitus -Metformin on hold, continue sliding scale insulin -A1C 7.3 (08/20/17)  Hx of Obstructive sleep apnea -Continue supportive care, patient used to have tracheostomy for this, now out since November 2018 -may need to be reevaluated in the outpatient setting   Procedures:  EGD 08/16/17  Consultations:  GI  psychiatry  Discharge Exam: BP 100/69   Pulse 88   Temp 98 F (36.7 C) (Oral)   Resp 18   Ht '5\' 7"'  (1.702 m)   Wt (!) 148.3 kg (326 lb 15.1 oz)   SpO2 98%   BMI 51.21 kg/m   08/21/17 patient seen and examined at her bedside. No change in her physical exam from yesterday. General: 57 yo obese female A&O x 3  Cardiovascular: RRR no rubs or gallops Respiratory: CTA no wheezes or rales  Discharge Instructions You were cared for by a hospitalist during your hospital stay. If you have any questions about your discharge medications or the care you received while you were in the hospital after you are discharged, you can call the unit and asked to speak with the hospitalist on call if the hospitalist that took care of you is not available. Once you are discharged, your primary care physician will handle any further medical issues. Please note that NO REFILLS for any discharge medications will be authorized once you are discharged, as it is imperative that you return to your primary care physician (or establish a relationship with a primary care physician if you do not have one) for your aftercare needs so that they can reassess your need for medications and monitor your lab values.   Allergies as of 08/22/2017      Reactions   Buprenorphine Hcl Hives   Morphine And Related Hives, Dermatitis      Medication List    STOP taking these medications   clonazePAM 1 MG tablet Commonly  known as:  KLONOPIN   ibuprofen 800 MG tablet Commonly known as:  ADVIL,MOTRIN   omeprazole 40 MG capsule Commonly known as:  PRILOSEC   traMADol 50 MG tablet Commonly known as:  ULTRAM     TAKE these medications   albuterol 108 (90 Base) MCG/ACT inhaler Commonly known as:  PROVENTIL HFA;VENTOLIN HFA Inhale 2 puffs into the lungs every 6 (six) hours as needed for wheezing or shortness of breath.   amLODipine 10 MG tablet Commonly known as:  NORVASC Take 10 mg by mouth daily.   atenolol 50 MG tablet Commonly known as:  TENORMIN Take 50 mg by mouth daily.   busPIRone 15 MG tablet Commonly known as:  BUSPAR Take 15 mg by mouth 2 (two) times daily.   DULoxetine 30 MG capsule Commonly known as:  CYMBALTA Take 30 mg by mouth daily.   gabapentin 300 MG capsule Commonly known as:  NEURONTIN Take 900 mg by mouth at bedtime.   hydrOXYzine 50 MG tablet Commonly known as:  ATARAX/VISTARIL Take 50 mg by mouth at bedtime.   hyoscyamine 0.125 MG SL tablet Commonly known as:  LEVSIN SL TAKE 0.125 mg TABLET BY MOUTH FOUR TIMES DAILY FOR IBS   metFORMIN 500 MG tablet Commonly known as:  GLUCOPHAGE Take 500 mg by mouth daily.  Na Sulfate-K Sulfate-Mg Sulf 17.5-3.13-1.6 GM/177ML Soln Take 1 kit as directed by mouth.   ondansetron 4 MG disintegrating tablet Commonly known as:  ZOFRAN-ODT Take 4 mg by mouth every 8 (eight) hours as needed for nausea or vomiting.   pantoprazole 40 MG tablet Commonly known as:  PROTONIX Take 1 tablet (40 mg total) by mouth 2 (two) times daily.   PEG 3350 Powd Take 17 g by mouth daily as needed.   promethazine 12.5 MG tablet Commonly known as:  PHENERGAN Take 1 tablet (12.5 mg total) by mouth every 6 (six) hours.   rosuvastatin 10 MG tablet Commonly known as:  CRESTOR Take 10 mg by mouth daily.   Vitamin D (Ergocalciferol) 50000 units Caps capsule Commonly known as:  DRISDOL Take 50,000 Units by mouth every Monday.      Allergies    Allergen Reactions  . Buprenorphine Hcl Hives  . Morphine And Related Hives and Dermatitis   Follow-up Information    Everardo Beals, NP Follow up.   Contact information: [facility] [place] [address] [phone]            The results of significant diagnostics from this hospitalization (including imaging, microbiology, ancillary and laboratory) are listed below for reference.    Significant Diagnostic Studies: Dg Chest 2 View  Result Date: 08/15/2017 CLINICAL DATA:  Subacute onset of generalized abdominal pain, chest pain and dizziness. EXAM: CHEST  2 VIEW COMPARISON:  Chest radiograph performed 03/19/2017 FINDINGS: The lungs are well-aerated. Minimal right basilar atelectasis is noted. There is no evidence of pleural effusion or pneumothorax. The heart is borderline normal in size. No acute osseous abnormalities are seen. IMPRESSION: Minimal right basilar atelectasis noted.  Lungs otherwise clear. Electronically Signed   By: Garald Balding M.D.   On: 08/15/2017 21:45   Ct Head Wo Contrast  Result Date: 08/15/2017 CLINICAL DATA:  Acute onset of severe left-sided headache, nausea and vomiting. EXAM: CT HEAD WITHOUT CONTRAST TECHNIQUE: Contiguous axial images were obtained from the base of the skull through the vertex without intravenous contrast. COMPARISON:  CT of the head performed 09/24/2014, and MRI of the brain performed 04/13/2016 FINDINGS: Brain: No evidence of acute infarction, hemorrhage, hydrocephalus, extra-axial collection or mass lesion/mass effect. Scattered periventricular and subcortical white matter change likely reflects small vessel ischemic microangiopathy, similar in appearance to 2016. The posterior fossa, including the cerebellum, brainstem and fourth ventricle, is within normal limits. The third and lateral ventricles, and basal ganglia are unremarkable in appearance. No mass effect or midline shift is seen. Vascular: No  hyperdense vessel or unexpected calcification. Mildly increased attenuation within the vasculature is thought to be artifactual in nature. Skull: There is no evidence of fracture; visualized osseous structures are unremarkable in appearance. Sinuses/Orbits: The visualized portions of the orbits are within normal limits. The paranasal sinuses and mastoid air cells are well-aerated. Other: No significant soft tissue abnormalities are seen. IMPRESSION: 1. No acute intracranial pathology seen on CT. 2. Scattered small vessel ischemic microangiopathy. Electronically Signed   By: Garald Balding M.D.   On: 08/15/2017 21:42   Nm Gastric Emptying  Result Date: 08/17/2017 CLINICAL DATA:  Diabetes.  Dysphagia. EXAM: NUCLEAR MEDICINE GASTRIC EMPTYING SCAN TECHNIQUE: After oral ingestion of radiolabeled meal, sequential abdominal images were obtained for 3 hours. Percentage of activity emptying the stomach was calculated at 1 hour, 2 hour, and 3 hr. RADIOPHARMACEUTICALS:  2.0 mCi Tc-59msulfur colloid in standardized meal COMPARISON:  None. FINDINGS:  Expected location of the stomach in the left upper quadrant. Ingested meal empties the stomach gradually over the course of the study. 44% emptied at 1 hr ( normal >= 10%) 69% emptied at 2 hr ( normal >= 40%) 95% emptied at 3 hr ( normal >= 70%) IMPRESSION: Normal gastric emptying study. Electronically Signed   By: Rolm Baptise M.D.   On: 08/17/2017 12:41   US Soft Tissue Head & Neck (non-thyroid)  Result Date: 08/15/2017 CLINICAL DATA:  Dysphagia, history of tracheostomy removal EXAM: ULTRASOUND OF HEAD/NECK SOFT TISSUES TECHNIQUE: Ultrasound examination of the head and neck soft tissues was performed in the area of clinical concern. COMPARISON:  CT 02/06/2017 FINDINGS: Targeted ultrasound of the neck in the region of tracheostomy scar is performed. No cyst or solid mass is visualized. IMPRESSION: No cyst/fluid collection or solid mass to correspond to palpable area near the  tracheostomy scar. Electronically Signed   By: Donavan Foil M.D.   On: 08/15/2017 23:07   Ct Abdomen Pelvis W Contrast  Result Date: 08/15/2017 CLINICAL DATA:  Abdominal pain and vomiting and diarrhea. EXAM: CT ABDOMEN AND PELVIS WITH CONTRAST TECHNIQUE: Multidetector CT imaging of the abdomen and pelvis was performed using the standard protocol following bolus administration of intravenous contrast. CONTRAST:  71m ISOVUE-300 IOPAMIDOL (ISOVUE-300) INJECTION 61% COMPARISON:  CT scan dated 04/11/2013 FINDINGS: Lower chest: Normal. Hepatobiliary: Hepatic steatosis. No focal lesions. Biliary tree is normal. Pancreas: Unremarkable. No pancreatic ductal dilatation or surrounding inflammatory changes. Spleen: Normal in size without focal abnormality. Adrenals/Urinary Tract: Adrenal glands are normal. 14 mm cyst in the lower pole of the left kidney, slightly increased in size since 2014. 4 cm slightly complex cyst in the lower pole of the right kidney also slightly increased in size, but otherwise unchanged. No hydronephrosis. Bladder is empty. Stomach/Bowel: Stomach is within normal limits. Appendix appears normal. No evidence of bowel wall thickening, distention, or inflammatory changes. Vascular/Lymphatic: No significant vascular findings are present. No enlarged abdominal or pelvic lymph nodes. Reproductive: Uterus and bilateral adnexa are unremarkable. Other: No abdominal wall hernia or abnormality. No abdominopelvic ascites. Musculoskeletal: No acute abnormality. 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 IMPRESSION: 1. No acute abnormalities. 2. Chronic appendix steatosis. No significant change in cystic lesions in both kidneys. Electronically Signed   By: JLorriane ShireM.D.   On: 08/15/2017 17:41   UKoreaRenal  Result Date: 08/18/2017 CLINICAL DATA:  Acute renal insufficiency EXAM: RENAL / URINARY TRACT ULTRASOUND COMPLETE COMPARISON:  CT abdomen and pelvis August 15, 2017  FINDINGS: Right Kidney: Length: 11.0 cm. Echogenicity and renal cortical thickness are within normal limits. No perinephric fluid or hydronephrosis visualized. There is a cyst in the mid right kidney measuring 3.7 x 3.4 x 3.0 cm. No sonographically demonstrable calculus or ureterectasis. Left Kidney: Length: 10.4 cm. Echogenicity and renal cortical thickness are within normal limits. No perinephric fluid or hydronephrosis visualized. There is a cyst in the mid to lower pole region on the left measuring 1.4 x 1.2 x 1.1 cm. No sonographically demonstrable calculus or ureterectasis. Bladder: Appears normal for degree of bladder distention. IMPRESSION: Renal cortical cyst on each side. No obstructing focus in either kidney. Renal cortical thickness and renal echogenicity within normal limits bilaterally. Electronically Signed   By: WLowella GripIII M.D.   On: 08/18/2017 14:38   Dg Chest Port 1 View  Result Date: 08/21/2017 CLINICAL DATA:  Shortness of Breath EXAM: PORTABLE CHEST 1 VIEW COMPARISON:  08/25/2017  FINDINGS: Right base atelectasis. Heart is normal size. No focal opacity on the left. No effusions or acute bony abnormality. IMPRESSION: Minimal right base atelectasis. Electronically Signed   By: Rolm Baptise M.D.   On: 08/21/2017 07:48   US Abdomen Limited Ruq  Result Date: 08/17/2017 CLINICAL DATA:  Epigastric pain. EXAM: ULTRASOUND ABDOMEN LIMITED RIGHT UPPER QUADRANT COMPARISON:  None. FINDINGS: Gallbladder: No gallstones or wall thickening visualized. No sonographic Murphy sign noted by sonographer. Common bile duct: Diameter: 5 mm Liver: Increased echogenicity with no focal mass. Portal vein is patent on color Doppler imaging with normal direction of blood flow towards the liver. IMPRESSION: 1. The gallbladder is normal in appearance with no stones. 2. Probable hepatic steatosis. 3. Right renal cyst measuring 3.2 cm. Electronically Signed   By: Dorise Bullion III M.D   On: 08/17/2017 18:56     Microbiology: Recent Results (from the past 240 hour(s))  MRSA PCR Screening     Status: None   Collection Time: 08/17/17  2:44 AM  Result Value Ref Range Status   MRSA by PCR NEGATIVE NEGATIVE Final    Comment:        The GeneXpert MRSA Assay (FDA approved for NASAL specimens only), is one component of a comprehensive MRSA colonization surveillance program. It is not intended to diagnose MRSA infection nor to guide or monitor treatment for MRSA infections. Performed at [facility], [place] [address], [place], [place] [address]   Culture, Urine     Status: Abnormal   Collection Time: 08/17/17  5:29 AM  Result Value Ref Range Status   Specimen Description URINE, CLEAN CATCH  Final   Special Requests   Final    NONE Performed at [facility], [place] [address], [place], [place] [address]    Culture MULTIPLE SPECIES PRESENT, SUGGEST RECOLLECTION (A)  Final   Report Status 08/18/2017 FINAL  Final  Culture, Urine     Status: Abnormal   Collection Time: 08/20/17  1:15 PM  Result Value Ref Range Status   Specimen Description URINE, CLEAN CATCH  Final   Special Requests   Final    NONE Performed at [facility], [place] [address], [place], [place] [address]    Culture MULTIPLE SPECIES PRESENT, SUGGEST RECOLLECTION (A)  Final   Report Status 08/21/2017 FINAL  Final     Labs: Basic Metabolic Panel: Recent Labs  Lab 08/17/17 0453 08/18/17 0337 08/19/17 0617 08/20/17 1728 08/22/17 0817  NA 141 145 144 142 144  K 3.1* 3.4* 3.0* 3.3* 3.0*  CL 107 111 108 109 108  CO2 '22 22 23 24 23  ' GLUCOSE 120* 113* 131* 162* 119*  BUN '14 11 8 9 9  ' CREATININE 1.52* 1.23* 1.27* 0.97 0.85  CALCIUM 8.3* 8.2* 8.5* 8.3* 8.3*  MG  --   --   --   --  1.6*   Liver Function Tests: Recent Labs  Lab 08/15/17 1600 08/16/17 0350  AST 21 17  ALT 21 19  ALKPHOS 111 100  BILITOT 0.5 0.6  PROT 7.6 6.8  ALBUMIN 3.9 3.5   Recent Labs  Lab 08/15/17 1600  LIPASE 40   Recent  Labs  Lab 08/16/17 0011  AMMONIA 26   CBC: Recent Labs  Lab 08/15/17 1600  08/16/17 0350 08/17/17 0453 08/18/17 0337 08/19/17 0617 08/20/17 1728  WBC 8.0  --  10.2 6.2 8.0 6.6 7.7  NEUTROABS 3.9  --   --   --   --   --   --  HGB 12.2  --  11.2* 9.8* 9.5* 9.7* 9.9*  HCT 37.3   < > 35.1* 30.3* 29.7* 29.8* 30.8*  MCV 82.0  --  83.6 83.0 82.5 83.0 83.0  PLT 374  --  329 291 311 315 303   < > = values in this interval not displayed.   Cardiac Enzymes: No results for input(s): CKTOTAL, CKMB, CKMBINDEX, TROPONINI in the last 168 hours. BNP: BNP (last 3 results) No results for input(s): BNP in the last 8760 hours.  ProBNP (last 3 results) No results for input(s): PROBNP in the last 8760 hours.  CBG: Recent Labs  Lab 08/21/17 1838 08/21/17 2107 08/22/17 0000 08/22/17 0510 08/22/17 1153  GLUCAP 160* 144* 169* 110* 128*       Signed:  Kayleen Memos, MD [facility] 08/22/2017, 2:44 PM

## 2017-08-22 NOTE — Progress Notes (Signed)
Patient has been accepted at [facility] psych and can be transported after 7pm. Voluntary consent form signed by patient and sent to facility.   Yesenia Cascoadia Ieshia Hatcher LCSW [phone]

## 2017-08-22 NOTE — Progress Notes (Signed)
Attempted to call report, RN is with a patient and will call back.

## 2017-08-23 DIAGNOSIS — E559 Vitamin D deficiency, unspecified: Secondary | ICD-10-CM | POA: Insufficient documentation

## 2017-08-23 DIAGNOSIS — E785 Hyperlipidemia, unspecified: Secondary | ICD-10-CM | POA: Insufficient documentation

## 2017-08-23 DIAGNOSIS — E1169 Type 2 diabetes mellitus with other specified complication: Secondary | ICD-10-CM | POA: Insufficient documentation

## 2017-08-24 ENCOUNTER — Telehealth: Payer: Self-pay

## 2017-08-24 DIAGNOSIS — K760 Fatty (change of) liver, not elsewhere classified: Secondary | ICD-10-CM | POA: Insufficient documentation

## 2017-08-24 NOTE — Telephone Encounter (Signed)
Called patient had to lvm with follow up appointment information. She is scheduled for first available on 10/03/17. Left her our office phone number if she needs to reschedule.

## 2017-08-24 NOTE — Telephone Encounter (Signed)
-----   Message from Benancio DeedsSteven P Armbruster, MD sent at 08/18/2017 11:36 AM EST ----- Regarding: outpatient follow up Raynelle FanningJulie this patient will need follow up in the clinic in a month or so, she is going to inpatient psych, may be there for a while, you can call her sometime next week. Thanks

## 2017-08-26 ENCOUNTER — Telehealth: Payer: Self-pay | Admitting: Gastroenterology

## 2017-08-26 NOTE — Telephone Encounter (Signed)
I had already cancelled this procedure, called endo spoke to [place] she verified that procedure is cancelled. Called patient let her know, also let her know of the follow up appointment scheduled to see Dr. Adela LankArmbruster in the office on 3/25.

## 2017-08-29 ENCOUNTER — Ambulatory Visit (HOSPITAL_COMMUNITY): Admit: 2017-08-29 | Payer: Medicaid Other | Admitting: Gastroenterology

## 2017-08-29 SURGERY — ESOPHAGOGASTRODUODENOSCOPY (EGD) WITH PROPOFOL
Anesthesia: Monitor Anesthesia Care

## 2017-08-31 ENCOUNTER — Ambulatory Visit: Payer: Self-pay | Admitting: Neurology

## 2017-09-23 ENCOUNTER — Telehealth: Payer: Self-pay | Admitting: Gastroenterology

## 2017-09-23 NOTE — Telephone Encounter (Signed)
Called patient back and let her know that she is scheduled to see Dr. Adela LankArmbruster on 3/25 and at that time will discuss rescheduling EGD.

## 2017-10-03 ENCOUNTER — Ambulatory Visit: Payer: Self-pay | Admitting: Gastroenterology

## 2017-10-04 ENCOUNTER — Ambulatory Visit: Payer: Self-pay | Admitting: Neurology

## 2017-10-19 ENCOUNTER — Emergency Department (HOSPITAL_COMMUNITY)
Admission: EM | Admit: 2017-10-19 | Discharge: 2017-10-20 | Disposition: A | Discharge status: Home / Self Care | Payer: Medicaid Other | Attending: Emergency Medicine | Admitting: Emergency Medicine

## 2017-10-19 ENCOUNTER — Encounter: Payer: Self-pay | Admitting: Family Medicine

## 2017-10-19 ENCOUNTER — Other Ambulatory Visit: Payer: Self-pay

## 2017-10-19 ENCOUNTER — Ambulatory Visit: Payer: Medicaid Other | Admitting: Family Medicine

## 2017-10-19 ENCOUNTER — Emergency Department (HOSPITAL_COMMUNITY): Payer: Medicaid Other

## 2017-10-19 ENCOUNTER — Encounter (HOSPITAL_COMMUNITY): Payer: Self-pay | Admitting: Emergency Medicine

## 2017-10-19 VITALS — BP 108/62 | HR 90 | Temp 98.5°F

## 2017-10-19 DIAGNOSIS — R51 Headache: Secondary | ICD-10-CM | POA: Insufficient documentation

## 2017-10-19 DIAGNOSIS — R251 Tremor, unspecified: Secondary | ICD-10-CM

## 2017-10-19 DIAGNOSIS — R531 Weakness: Secondary | ICD-10-CM

## 2017-10-19 DIAGNOSIS — E1122 Type 2 diabetes mellitus with diabetic chronic kidney disease: Secondary | ICD-10-CM | POA: Diagnosis not present

## 2017-10-19 DIAGNOSIS — R200 Anesthesia of skin: Secondary | ICD-10-CM | POA: Diagnosis not present

## 2017-10-19 DIAGNOSIS — N183 Chronic kidney disease, stage 3 unspecified: Secondary | ICD-10-CM

## 2017-10-19 DIAGNOSIS — M6281 Muscle weakness (generalized): Secondary | ICD-10-CM | POA: Insufficient documentation

## 2017-10-19 DIAGNOSIS — I129 Hypertensive chronic kidney disease with stage 1 through stage 4 chronic kidney disease, or unspecified chronic kidney disease: Secondary | ICD-10-CM | POA: Diagnosis not present

## 2017-10-19 DIAGNOSIS — R29810 Facial weakness: Secondary | ICD-10-CM | POA: Diagnosis not present

## 2017-10-19 DIAGNOSIS — Z79899 Other long term (current) drug therapy: Secondary | ICD-10-CM | POA: Insufficient documentation

## 2017-10-19 DIAGNOSIS — Z7984 Long term (current) use of oral hypoglycemic drugs: Secondary | ICD-10-CM | POA: Insufficient documentation

## 2017-10-19 DIAGNOSIS — R42 Dizziness and giddiness: Secondary | ICD-10-CM | POA: Diagnosis not present

## 2017-10-19 LAB — COMPREHENSIVE METABOLIC PANEL
ALT: 16 U/L (ref 14–54)
ANION GAP: 13 (ref 5–15)
AST: 15 U/L (ref 15–41)
Albumin: 3.5 g/dL (ref 3.5–5.0)
Alkaline Phosphatase: 78 U/L (ref 38–126)
BUN: 22 mg/dL — ABNORMAL HIGH (ref 6–20)
CALCIUM: 9 mg/dL (ref 8.9–10.3)
CHLORIDE: 109 mmol/L (ref 101–111)
CO2: 21 mmol/L — AB (ref 22–32)
Creatinine, Ser: 1.3 mg/dL — ABNORMAL HIGH (ref 0.44–1.00)
GFR calc non Af Amer: 45 mL/min — ABNORMAL LOW (ref >60)
GFR, EST AFRICAN AMERICAN: 52 mL/min — AB (ref >60)
Glucose, Bld: 92 mg/dL (ref 65–99)
POTASSIUM: 4.1 mmol/L (ref 3.5–5.1)
SODIUM: 143 mmol/L (ref 135–145)
Total Bilirubin: 0.4 mg/dL (ref 0.3–1.2)
Total Protein: 6.9 g/dL (ref 6.5–8.1)

## 2017-10-19 LAB — RAPID URINE DRUG SCREEN, HOSP PERFORMED
Amphetamines: NOT DETECTED
Barbiturates: NOT DETECTED
Benzodiazepines: POSITIVE — AB
Cocaine: NOT DETECTED
OPIATES: NOT DETECTED
TETRAHYDROCANNABINOL: NOT DETECTED

## 2017-10-19 LAB — CBC WITH DIFFERENTIAL/PLATELET
BASOS ABS: 0 10*3/uL (ref 0.0–0.1)
Basophils Relative: 0 %
Eosinophils Absolute: 0.2 10*3/uL (ref 0.0–0.7)
Eosinophils Relative: 2 %
HEMATOCRIT: 34.7 % — AB (ref 36.0–46.0)
HEMOGLOBIN: 11.2 g/dL — AB (ref 12.0–15.0)
LYMPHS PCT: 27 %
Lymphs Abs: 2.7 10*3/uL (ref 0.7–4.0)
MCH: 27.2 pg (ref 26.0–34.0)
MCHC: 32.3 g/dL (ref 30.0–36.0)
MCV: 84.2 fL (ref 78.0–100.0)
MONOS PCT: 5 %
Monocytes Absolute: 0.5 10*3/uL (ref 0.1–1.0)
NEUTROS ABS: 6.8 10*3/uL (ref 1.7–7.7)
NEUTROS PCT: 66 %
Platelets: 324 10*3/uL (ref 150–400)
RBC: 4.12 MIL/uL (ref 3.87–5.11)
RDW: 15.3 % (ref 11.5–15.5)
WBC: 10.3 10*3/uL (ref 4.0–10.5)

## 2017-10-19 LAB — URINALYSIS, ROUTINE W REFLEX MICROSCOPIC
BILIRUBIN URINE: NEGATIVE
Glucose, UA: NEGATIVE mg/dL
Ketones, ur: NEGATIVE mg/dL
NITRITE: NEGATIVE
PH: 5 (ref 5.0–8.0)
Protein, ur: NEGATIVE mg/dL
SPECIFIC GRAVITY, URINE: 1.016 (ref 1.005–1.030)

## 2017-10-19 LAB — I-STAT TROPONIN, ED: Troponin i, poc: 0 ng/mL (ref 0.00–0.08)

## 2017-10-19 LAB — ETHANOL: Alcohol, Ethyl (B): <10 mg/dL (ref <10)

## 2017-10-19 LAB — MAGNESIUM: MAGNESIUM: 2.1 mg/dL (ref 1.7–2.4)

## 2017-10-19 LAB — GLUCOSE, POCT (MANUAL RESULT ENTRY): POC GLUCOSE: 159 mg/dL — AB (ref 70–99)

## 2017-10-19 MED ORDER — ACETAMINOPHEN 325 MG PO TABS
650.0000 mg | ORAL_TABLET | Freq: Once | ORAL | Status: AC
  Administered 2017-10-19: 650 mg via ORAL
  Filled 2017-10-19: qty 2

## 2017-10-19 MED ORDER — SODIUM CHLORIDE 0.9 % IV BOLUS
1000.0000 mL | Freq: Once | INTRAVENOUS | Status: AC
  Administered 2017-10-19: 1000 mL via INTRAVENOUS

## 2017-10-19 MED ORDER — LORAZEPAM 2 MG/ML IJ SOLN
1.0000 mg | Freq: Once | INTRAMUSCULAR | Status: AC
  Administered 2017-10-19: 1 mg via INTRAVENOUS
  Filled 2017-10-19: qty 1

## 2017-10-19 NOTE — ED Notes (Signed)
Pt back from CT

## 2017-10-19 NOTE — ED Provider Notes (Cosign Needed)
[place] EMERGENCY DEPARTMENT Provider Note   CSN: [id number] Arrival date & time: 10/19/17  1504     History   Chief Complaint Chief Complaint  Patient presents with  . Tremors    HPI Yesenia [name] is a 57 y.o. female.  HPI   Yesenia [name] is a 57 y.o. female, with a history of peptic ulcer, anemia, CKD stage III, fibromyalgia, hyperlipidemia, HTN, TIA, presenting to the ED with tremors beginning last night.  Episodes begin with shaking in the head and bilateral upper extremities and then "it's like a film comes over my eyes." Accompanied by lightheadedness. After the shaking stops, she has a headache to the left temporal region, throbbing, 8/10, nonradiating, lasts for about 2-3 minutes.  Endorses right-sided facial droop as well as right upper and lower extremity weakness that began around the beginning of March.  Endorses numbness in the left 4th and 5th fingers as well as the entire right hand beginning around the same time.  Underwent ECT therapy for depression with 5-6 sessions in February 2019.  States she was admitted to hospital in [place] during that time.  Discharged on March 1.  Denies changes in medications. Denies alcohol or illicit drug use.   Denies fever/chills, N/V/D, chest pain, shortness of breath, vision loss, falls/trauma, head injury, abdominal pain, or any other complaints.     Past Medical History:  Diagnosis Date  . Acute stomach ulcer 08/15/2017  . AKI (acute kidney injury) ([place])    Acute kidney injury on CKD stage II-III/notes 08/17/2017  . Anemia   . Anemia   . Arthritis    "knees" (08/17/2017)  . Chronic pain   . CKD (chronic kidney disease)    Stage 3  . Depression   . Fibromyalgia    hospitilized 12/16 due to inability to walk  . Gastric ulcer   . History of gastritis 2019  . Hyperlipemia   . Hypertension   . Insomnia   . Neuropathy   . Right sided weakness   . Sleep apnea    "CPAPs didn't work for me; that's  why they did OR" (08/17/2017)  . Stroke [facility]) 2014?   no residual weakness   . Type II diabetes mellitus ([place])     does not check CBG's (08/17/2017)  . Vitamin deficiency    Vit D    Patient Active Problem List   Diagnosis Date Noted  . Dysphagia 08/16/2017  . Nausea and vomiting   . Abdominal pain, epigastric   . MDD (major depressive disorder), recurrent severe, without psychosis ([place])   . AKI (acute kidney injury) ([place]) 08/15/2017  . Sleep apnea 03/20/2017  . Acute on chronic respiratory failure with hypoxemia ([place]) 09/30/2016  . Acute tracheitis 09/30/2016  . Abscess   . Complication of tracheostomy ([place]) 06/23/2016  . Bleeding 06/23/2016  . Iron deficiency anemia 05/26/2016  . Tracheostomy status ([place]) 05/23/2016  . Irritable bowel syndrome 05/21/2016  . Anxiety state 05/21/2016  . Tracheostomy dependent ([place])   . Generalized weakness 04/13/2016  . Weakness generalized 04/13/2016  . HCAP (healthcare-associated pneumonia) 03/06/2016  . Fibromyalgia 02/22/2016  . Cellulitis 02/21/2016  . Abdominal pain 02/16/2016  . Constipation 02/16/2016  . CKD (chronic kidney disease) stage 3, GFR 30-59 ml/min (HCC) 02/16/2016  . Fever   . Cough   . OSA (obstructive sleep apnea) 02/13/2016  . Insomnia 08/10/2015  . Polyneuropathy 07/10/2015  . GERD (gastroesophageal reflux disease) 07/10/2015  . Acute kidney injury superimposed on chronic  kidney disease ([place]) 07/07/2015  . Gait disturbance 07/07/2015  . Morbid (severe) obesity due to excess calories ([place]) complicated by OSA 08/65/7846  . Left hip pain 07/06/2015  . Chronic pain   . Right sided weakness   . Headache(784.0) 04/14/2013  . Encephalopathy acute 04/11/2013  . Type 2 diabetes mellitus with stage 3 chronic kidney disease, without long-term current use of insulin ([place]) 04/11/2013  . Dyslipidemia 04/11/2013  . Chest pain 02/15/2013  . Cerebral infarction ([place]) 01/14/2013  . Essential hypertension 01/14/2013  . Anemia  01/14/2013    Past Surgical History:  Procedure Laterality Date  . ESOPHAGOGASTRODUODENOSCOPY (EGD) WITH PROPOFOL N/A 08/16/2017   Procedure: ESOPHAGOGASTRODUODENOSCOPY (EGD) WITH PROPOFOL;  Surgeon: Yetta Flock, MD;  Location: [place];  Service: Gastroenterology;  Laterality: N/A;  . [place]   "stuck rods in it"  . KNEE ARTHROSCOPY Right 1992, 1995,  . OOPHORECTOMY Right 1993?  . TRACHEOSTOMY TUBE PLACEMENT N/A 02/13/2016   Procedure: TRACHEOSTOMY;  Surgeon: Melida Quitter, MD;  Location: [place];  Service: ENT;  Laterality: N/A;  . TUBAL LIGATION       OB History   None      Home Medications    Prior to Admission medications   Medication Sig Start Date End Date Taking? Authorizing Provider  amLODipine (NORVASC) 10 MG tablet Take 10 mg by mouth daily. 09/07/16  Yes [provider]  atenolol (TENORMIN) 50 MG tablet Take 50 mg by mouth daily. 02/16/16  Yes [provider]  benzonatate (TESSALON) 100 MG capsule Take 100 mg by mouth as needed.   Yes [provider]  cyclobenzaprine (FLEXERIL) 5 MG tablet Take 5 mg by mouth as needed. 09/10/17  Yes [provider]  DULoxetine (CYMBALTA) 60 MG capsule Take 60 mg by mouth daily.    Yes [provider]  gabapentin (NEURONTIN) 300 MG capsule Take 900 mg by mouth at bedtime. 08/01/17  Yes [provider]  HYDROcodone-acetaminophen (NORCO/VICODIN) 5-325 MG tablet Take 1 tablet by mouth as needed. 09/09/17  Yes [provider]  hydrOXYzine (ATARAX/VISTARIL) 25 MG tablet Take 25 mg by mouth at bedtime. 09/23/17  Yes [provider]  hyoscyamine (LEVSIN SL) 0.125 MG SL tablet TAKE 0.125 mg TABLET BY MOUTH FOUR TIMES DAILY FOR IBS 04/15/17  Yes [provider]  LORazepam (ATIVAN) 0.5 MG tablet Take 0.5 mg by mouth at bedtime.  10/05/17  Yes [provider]  magic mouthwash w/lidocaine SOLN Take 5 mLs by mouth as needed.   Yes [provider]  montelukast (SINGULAIR) 10 MG tablet Take 1 mg by mouth daily. 10/05/17  Yes [provider]  nortriptyline (PAMELOR) 10 MG capsule Take 10 mg by mouth daily. 10/05/17  Yes [provider]  Omega-3 Fatty Acids (FISH OIL) 1000 MG CAPS Take 1,000 mg of amoxicillin by mouth daily. 09/10/17  Yes [provider]  ondansetron (ZOFRAN-ODT) 4 MG disintegrating tablet Take 4 mg by mouth every 8 (eight) hours as needed for nausea or vomiting.  02/21/17  Yes [provider]  pantoprazole (PROTONIX) 40 MG tablet Take 1 tablet (40 mg total) by mouth 2 (two) times daily. 08/21/17  Yes Kayleen Memos, DO  promethazine (PHENERGAN) 12.5 MG tablet Take 1 tablet (12.5 mg total) by mouth every 6 (six) hours. 08/21/17  Yes Hall, Carole N, DO  sitaGLIPtin (JANUVIA) 25 MG tablet Take 25 mg by mouth daily. 09/10/17  Yes [provider]  traZODone (DESYREL) 50 MG  tablet Take 50 mg by mouth at bedtime. 09/10/17  Yes [provider]  Vitamin D, Ergocalciferol, (DRISDOL) 50000 units CAPS capsule Take 50,000 Units by mouth every Monday. 02/18/17  Yes [provider]  Na Sulfate-K Sulfate-Mg Sulf 17.5-3.13-1.6 GM/177ML SOLN Take 1 kit as directed by mouth. Patient not taking: Reported on 10/19/2017 05/23/17   Armbruster, Carlota Raspberry, MD    Family History Family History  Problem Relation Age of Onset  . Breast cancer Mother   . Leukemia Brother   . Breast cancer Maternal Grandmother     Social History Social History   Tobacco Use  . Smoking status: Never Smoker  . Smokeless tobacco: Never Used  Substance Use Topics  . Alcohol use: No    Alcohol/week: 0.0 oz  . Drug use: No     Allergies   Buprenorphine hcl and Morphine and related   Review of Systems Review of Systems  Constitutional: Negative for chills, diaphoresis and fever.  Respiratory: Negative for cough and shortness of breath.   Cardiovascular: Negative for chest pain.    Gastrointestinal: Negative for abdominal pain, diarrhea, nausea and vomiting.  Neurological: Positive for tremors, facial asymmetry, weakness, light-headedness, numbness and headaches.  All other systems reviewed and are negative.    Physical Exam Updated Vital Signs BP 122/72 (BP Location: Right Arm)   Pulse 72   Temp 98.3 F (36.8 C) (Oral)   Resp 18   SpO2 98%   Physical Exam  Constitutional: She is oriented to person, place, and time. She appears well-developed and well-nourished. No distress.  HENT:  Head: Normocephalic and atraumatic.  Eyes: Pupils are equal, round, and reactive to light. Conjunctivae and EOM are normal.  Neck: Normal range of motion. Neck supple.  Cardiovascular: Normal rate, regular rhythm, normal heart sounds and intact distal pulses.  Pulmonary/Chest: Effort normal and breath sounds normal. No respiratory distress.  Abdominal: Soft. There is no tenderness. There is no guarding.  Musculoskeletal: She exhibits no edema.  Normal motor function intact in all extremities and spine. No midline spinal tenderness.   Lymphadenopathy:    She has no cervical adenopathy.  Neurological: She is alert and oriented to person, place, and time.  Right-sided facial droop. No sensory deficits.  No noted speech deficits. No aphasia. Patient handles oral secretions without difficulty. No noted swallowing defects.  Right grip weaker than left. Strength 3/5 in the right triceps/biceps Right arm drift. Strength 5/5 in the left triceps/biceps. Strength 2+/5 with flexion and extension of the left hip, knee, and ankle. Strength 5/5 with flexion and extension of the left hip, knee, and ankle. Patellar DTRs 2+ bilaterally. Gait testing deferred Cranial nerves otherwise III-XII grossly intact.   Skin: Skin is warm and dry. She is not diaphoretic.  Psychiatric: She has a normal mood and affect. Her behavior is normal.  Nursing note and vitals reviewed.    ED Treatments /  Results  Labs (all labs ordered are listed, but only abnormal results are displayed) Labs Reviewed  COMPREHENSIVE METABOLIC PANEL - Abnormal; Notable for the following components:      Result Value   CO2 21 (*)    BUN 22 (*)    Creatinine, Ser 1.30 (*)    GFR calc non Af Amer 45 (*)    GFR calc Af Amer 52 (*)    All other components within normal limits  CBC WITH DIFFERENTIAL/PLATELET - Abnormal; Notable for the following components:   Hemoglobin 11.2 (*)    HCT  34.7 (*)    All other components within normal limits  URINALYSIS, ROUTINE W REFLEX MICROSCOPIC - Abnormal; Notable for the following components:   APPearance HAZY (*)    Hgb urine dipstick SMALL (*)    Leukocytes, UA TRACE (*)    Bacteria, UA RARE (*)    Squamous Epithelial / LPF 0-5 (*)    All other components within normal limits  RAPID URINE DRUG SCREEN, HOSP PERFORMED - Abnormal; Notable for the following components:   Benzodiazepines POSITIVE (*)    All other components within normal limits  MAGNESIUM  ETHANOL  I-STAT TROPONIN, ED    EKG EKG Interpretation  Date/Time:  Wednesday October 19 2017 16:55:47 EDT Ventricular Rate:  69 PR Interval:    QRS Duration: 105 QT Interval:  416 QTC Calculation: 446 R Axis:   24 Text Interpretation:  Sinus rhythm Since last tracing QT now normal Confirmed by Daleen Bo [id number]) on 10/19/2017 10:05:08 PM   Radiology Dg Chest 2 View  Result Date: 10/19/2017 CLINICAL DATA:  Syncopal episode EXAM: CHEST - 2 VIEW COMPARISON:  08/21/2017 FINDINGS: Non inclusion of left CP angle on frontal view. Hazy opacity at left base, likely attributable to overlying soft tissue. No pleural effusion. Stable cardiomediastinal silhouette with aortic atherosclerosis. No pneumothorax. IMPRESSION: No active cardiopulmonary disease. Hazy opacity at the left base suspected to be secondary to overlying soft tissues. Electronically Signed   By: Donavan Foil M.D.   On: 10/19/2017 17:29   Ct Head Wo  Contrast  Result Date: 10/19/2017 CLINICAL DATA:  Pt had ECT therapy for depression within the week at [facility]. She started having tremors and dizziness. Was at PCP and was sent for further evaluation. EXAM: CT HEAD WITHOUT CONTRAST TECHNIQUE: Contiguous axial images were obtained from the base of the skull through the vertex without intravenous contrast. COMPARISON:  08/15/2017 FINDINGS: Brain: No evidence of acute infarction, hemorrhage, hydrocephalus, extra-axial collection or mass lesion/mass effect. There is ventricular enlargement consistent with mild central atrophy, advanced for age, but stable from prior study. Patchy areas of periventricular and subcortical white matter hypoattenuation are also noted, nonspecific, likely small vessel ischemic change, also stable from prior exam. Vascular: No hyperdense vessel or unexpected calcification. Skull: Normal. Negative for fracture or focal lesion. Sinuses/Orbits: Globes and orbits are unremarkable. Visualized sinuses and mastoid air cells are clear. Other: None. IMPRESSION: 1. No acute intracranial abnormalities. 2. Mild atrophy, advanced for age. White matter changes consistent with moderate chronic microvascular ischemic change. Stable appearance from the recent prior study. Electronically Signed   By: Lajean Manes M.D.   On: 10/19/2017 17:52    Procedures Procedures (including critical care time)  Medications Ordered in ED Medications  acetaminophen (TYLENOL) tablet 650 mg (650 mg Oral Given 10/19/17 1950)  sodium chloride 0.9 % bolus 1,000 mL (1,000 mLs Intravenous New Bag/Given 10/19/17 1929)  LORazepam (ATIVAN) injection 1 mg (1 mg Intravenous Given 10/19/17 2052)     Initial Impression / Assessment and Plan / ED Course  I have reviewed the triage vital signs and the nursing notes.  Pertinent labs & imaging results that were available during my care of the patient were reviewed by me and considered in my medical decision making (see  chart for details).  Clinical Course as of Oct 19 2212  Wed Oct 19, 2017  1852 Spoke with Dr. Cheral Marker, neurologist.  Recommends obtaining MRI. If normal, patient can go home with outpatient follow up.  Call oncoming neurologist, Dr. Lorraine Lax, with MRI  results.    [SJ]  2213 Improved from previous values.  Hemoglobin(!): 11.2 [SJ]    Clinical Course User Index [SJ] Joy, Shawn C, PA-C    Patient presents with subjective and objective right-sided weakness along with right-sided facial droop for the past several weeks. Slight decrease in CO2, increase in creatinine, and increase in BUN, suspected to be due to dehydration, addressed with IV fluids.  Findings and plan of care discussed with Daleen Bo, MD.   End of shift patient care handoff report given to Montine Circle, PA-C. Plan: MRI pending.  Call neurologist with results.  Admit for continued stroke workup versus discharge for outpatient follow-up.  Vitals:   10/19/17 1930 10/19/17 1951 10/19/17 2000 10/19/17 2030  BP: 102/62  120/75 120/76  Pulse: 77  64 66  Resp: _0 Temp:      TempSrc:      SpO2: 95%  100% 93%  Weight:  (!) 141.1 kg (311 lb)    Height:  _1  (1.676 m)       Final Clinical Impressions(s) / ED Diagnoses   Final diagnoses:  None    ED Discharge Orders    None       Lorayne Bender, PA-C 10/19/17 2214

## 2017-10-19 NOTE — ED Notes (Signed)
Pt tried to ambulate to restroom to get urine sample. Upon standing from the bed she was unsteady on her feet and complained of dizziness with the first few steps and couldn't stand up straight. She was told by this tech to get back in bed. Urine sample was not obtained.

## 2017-10-19 NOTE — ED Provider Notes (Signed)
Patient signed out to me pending MRI.  Patient has had right facial droop, right upper and lower extremity weakness and occasional tremors for the past 4-6 weeks.    Joy, PA-C, discussed case with Dr. Otelia LimesLindzen, who recommends DC pending normal MRI brain.  I discussed the MRI results and case briefly with Dr. Laurence SlateAroor, who was aware of the patient and agrees with outpatient followup.  Patient understands and agrees with the plan.    Roxy HorsemanBrowning, Nolen Lindamood, PA-C 10/19/17 [id number]    Mancel BaleWentz, Elliott, MD 10/20/17 [phone]

## 2017-10-19 NOTE — Discharge Instructions (Signed)
No clear cause of your symptoms was found in the ER today.  After discussion and review of your results with our on-call neurologists, it is recommended that you be seen and evaluated by a neurologist on an outpatient basis.  Please contact the group listed below.

## 2017-10-19 NOTE — ED Triage Notes (Signed)
A&Ox4; no signs of distress; able to walk and had no tremors noted by EMS

## 2017-10-19 NOTE — ED Triage Notes (Signed)
[facility] She started having tremors and dizziness. Was at PCP and was sent for further evaluation.

## 2017-10-19 NOTE — ED Notes (Signed)
Patient transported to MRI 

## 2017-10-19 NOTE — ED Notes (Signed)
Patient transported to CT 

## 2017-10-19 NOTE — Progress Notes (Signed)
Subjective:   Patient ID: Yesenia [name]    DOB: [birth year], 57 y.o. female   MRN: [id number]  CC: dizziness, tremor   HPI: Yesenia [name] is a 57 y.o. female who presents to clinic today for new onset dizziness and tremor.  Patient presents to establish care today, however while in the waiting room had a near syncopal episode.  She states she felt very dizzy and was unable to sit upright.  She endorses dizziness and headache x 2-3 days.  Headache is 8/10 and not localized to one particular location.  Denies photophobia or phonophobia.  Denies fever, chills, neck stiffness.  Denies chest pain, shortness of breath.  She endorses numbness and tingling of her right pinky and fourth ring finger.  Denies weakness.  She lives alone.  Has not sustained any falls or loss of consciousness.  Denies hitting her head.  She has a history of diabetes and has felt hypoglycemic at home at times.                                               Prior to this, she was hospitalized in February for 2 peptic ulcers and stomach pain at [facility].  She states she suffers from depression and was recently treated at [facility].  She was discharged on March 1 and had received 5 shock treatments (electroconvulsive therapy) for her depression.  During this hospitalization, she was started on amitriptyline.  Have been compliant with this since discharge and endorses dry mouth as a side effect.  She has been on Cymbalta for 3 months.     ROS: No fever, chills, neck stiffness, weakness.  No CP, SOB, palpitations.  Social: never smoker  Medications reviewed.  Objective:   BP 108/62   Pulse 90   Temp 98.5 F (36.9 C) (Oral)   SpO2 95%  Vitals and nursing note reviewed.  General: 57 yo female, in mild distress, marked tremor of head and R hand  HEENT: NCAT, EOMI, PERRL, MMM Neck: supple, normal ROM  CV: RRR no MRG  Lungs: CTAB, normal effort  Abdomen: soft, NTND, +bs  Skin: warm, dry, no  rash Extremities: warm and well perfused, normal tone Neuro: alert, ox3, R sided facial droop noted, tremor of bilateral upper extr and head noted, speech is normal, R grip strength 4/5 compared to L side 5/5.  Normal 5/5 strength of bilateral LE.  Normal sensation.  Psych: normal mood and affect   Assessment & Plan:   Tremor Reports tremor, dizziness and headache x 3 days duration.  On exam she appears to be in alert and oriented but in mild distress. Stable vitals.  Mild hypotension to 108/62 noted and POC CBG checked due to almost falling in waiting room. CBG 195.  Afebrile with temp 98.65F.  Satting 95% on RA and breathing is comfortable. HR 90.  Appears shaky at visit and having rhythmic tremor activity which she states are new for her as of the past few days.  Endorses having difficulty controlling her hand and face movements.  No recent new medications except amitriptyline. Recent CT head 08/15/2017 with no acute intracranial process.   Do not suspect infectious process at this time given absence of fever, chills, neck stiffness and systemic symptoms.  Unsure if this is medication-related, would need to r/o stroke with R facial droop, as  well as seizure.  -Precepted with Drs. [place] -Will send to ED for further workup --referral to Neurology pending negative workup in ED -Recommend EEG to r/o seizure.  Unknown baseline as have not met her before and was to establish care at today's visit.  -can consider MRI brain  -Discussed pt with ED charge nurse and EMS called for transport.   Orders Placed This Encounter  Procedures  . Ambulatory referral to Neurology    Referral Priority:   Routine    Referral Type:   Consultation    Referral Reason:   Specialty Services Required    Requested Specialty:   Neurology    Number of Visits Requested:   1  . POCT glucose (manual entry)   Lovenia Kim, MD [place], PGY-2 10/25/2017 5:56 PM

## 2017-10-19 NOTE — ED Notes (Signed)
Pt back from X-Ray.  

## 2017-10-19 NOTE — ED Notes (Signed)
Patient transported to X-ray 

## 2017-10-24 ENCOUNTER — Encounter: Payer: Self-pay | Admitting: Neurology

## 2017-10-25 DIAGNOSIS — R251 Tremor, unspecified: Secondary | ICD-10-CM | POA: Insufficient documentation

## 2017-10-25 HISTORY — DX: Tremor, unspecified: R25.1

## 2017-10-25 NOTE — Assessment & Plan Note (Signed)
Reports tremor, dizziness and headache x 3 days duration.  On exam she appears to be in alert and oriented but in mild distress. Stable vitals.  Mild hypotension to 108/62 noted and POC CBG checked due to almost falling in waiting room. CBG 195.  Afebrile with temp 98.33F.  Satting 95% on RA and breathing is comfortable. HR 90.  Appears shaky at visit and having rhythmic tremor activity which she states are new for her as of the past few days.  Endorses having difficulty controlling her hand and face movements.  No recent new medications except amitriptyline. Recent CT head 08/15/2017 with no acute intracranial process.   Do not suspect infectious process at this time given absence of fever, chills, neck stiffness and systemic symptoms.  Unsure if this is medication-related, would need to r/o stroke with R facial droop, as well as seizure.  -Precepted with Drs. [place] -Will send to ED for further workup --referral to Neurology pending negative workup in ED -Recommend EEG to r/o seizure.  Unknown baseline as have not met her before and was to establish care at today's visit.  -can consider MRI brain  -Discussed pt with ED charge nurse and EMS called for transport.

## 2017-11-07 ENCOUNTER — Encounter: Payer: Self-pay | Admitting: Neurology

## 2017-11-15 ENCOUNTER — Ambulatory Visit: Payer: Self-pay | Admitting: Gastroenterology

## 2017-12-01 ENCOUNTER — Encounter: Payer: Self-pay | Admitting: Family Medicine

## 2017-12-01 ENCOUNTER — Other Ambulatory Visit: Payer: Self-pay

## 2017-12-01 ENCOUNTER — Ambulatory Visit: Payer: Medicaid Other | Admitting: Family Medicine

## 2017-12-01 VITALS — BP 106/64 | HR 70 | Temp 98.2°F | Ht 67.0 in | Wt 297.4 lb

## 2017-12-01 DIAGNOSIS — E1122 Type 2 diabetes mellitus with diabetic chronic kidney disease: Secondary | ICD-10-CM

## 2017-12-01 DIAGNOSIS — R112 Nausea with vomiting, unspecified: Secondary | ICD-10-CM | POA: Diagnosis not present

## 2017-12-01 DIAGNOSIS — M7989 Other specified soft tissue disorders: Secondary | ICD-10-CM

## 2017-12-01 DIAGNOSIS — N183 Chronic kidney disease, stage 3 unspecified: Secondary | ICD-10-CM

## 2017-12-01 LAB — POCT GLYCOSYLATED HEMOGLOBIN (HGB A1C): HbA1c, POC (controlled diabetic range): 6.2 % (ref 0.0–7.0)

## 2017-12-01 NOTE — Patient Instructions (Addendum)
You were seen in clinic for nausea and right hand pain.  I do not suspect you have an infection causing your nausea and weight loss.  It is likely that your peptic ulcer and decreased appetite are causing both.  I would like you to keep your appointment with gastroenterology at the end of June for further management of this.  In the meantime, you can continue taking Zofran as needed for nausea.    For your right hand pain, I do not think this is related to a skin or joint infection,  but it is possible that you may have a small bone spur causing your discomfort. I have ordered an x-ray- you can go to the first floor of [facility] (radiology department) and do not need an appointment for this.  I will call you once I have the results of the imaging.    Please call clinic if you have any new or worsening symptoms.  Be well, Freddrick March, MD

## 2017-12-01 NOTE — Progress Notes (Signed)
Subjective:   Patient ID: Yesenia [name]    DOB: 1961/04/08, 57 y.o. female   MRN: [id number]  CC: nausea, R hand pain   HPI: Yesenia [name] is a 57 y.o. female who presents to clinic today for nausea and right hand pain.  Nausea Patient reports she has not felt like eating much which first began in January.  She reports a weight loss from 309 to 297 pounds.  She denies episodes of vomiting.  No fever, chills.  She reports occasional left-sided belly pain, 3/10 in severity, nonradiating.  She has been taking Zofran prn for this which helps.  She follows with GI and had an endoscopy performed on February 4 while she was hospitalized.  She reports two peptic ulcers were found which she suspects is causing the pain and loss of appetite.  She will be seeing gastroenterology again at the end of June.  She takes pantoprazole for reflux.    R hand pain She reports her arm pain began after hospitalization in February where she had shock treatment.  Her R hand went numb.  This has since resolved, but she reports a small bump/swelling over her R ring finger for the past week.  The bump is painful, no drainage noted.  She has not tried anything for the pain but has applied an ice pack to try to decrease the swelling which helps minimally.  She feels it has increased slightly in size.  No h/o trauma or injury to finger to suspect fracture.  No fever, chills.  No redness or surrounding warmth present.    Health Maintenance -Due for hep c screening, pap, colonoscopy, tdap, foot and eye exam    -Needs to be seen back for diabetes follow-up    ROS: See HPI for pertinent ROS.     Social: pt is a never smoker   Medications reviewed. Objective:   BP 106/64   Pulse 70   Temp 98.2 F (36.8 C) (Oral)   Ht  (1.702 m)   Wt 297 lb 6.4 oz (134.9 kg)   SpO2 98%   BMI 46.58 kg/m  Vitals and nursing note reviewed.  General: 57 yo female, chronically ill-appearing and disheveled  HEENT: NCAT, EOMI, MMM    Neck: supple, no JVD  CV: RRR no MRG  Lungs: CTAB, normal effort on RA  Abdomen: soft, NTND, +bs  Skin: warm, dry, no rash  Extremities: warm and well perfused, normal tone   Assessment & Plan:   Swelling of right ring finger Pt reports swelling and 0.5cm  "lump" of R ring finger x1 week.  She feels this is progressively getting larger and more painful.  It is tender to palpation on exam.  No known history of trauma or injury to contribute, and no obvious deformity, making fracture less likely.  Low suspicion for cellulitis given no fever/chills and no surrounding erythema.   Bump is not firm and is mobile as far as I am able to tell, however physical exam is limited 2/2 pain.  She is unable to fully grip with R hand due to discomfort.  Denies numbness, tingling.  Possible may be soft tissue mass vs bone spur.  Would advise imaging for further evaluation.  -XR R hand complete  -Aleve BID prn for pain  Nausea and vomiting Endorses ongoing nausea and decreased appetite likely 2/2 peptic ulcer. Denies vomiting. Taking zofran with no acute worsening.  Advise continuing ppi and keeping her f/u appt with GI end of June.  Discussed with pt that we will have to address health maintenance at following visit as acute issues were covered today.     Orders Placed This Encounter  Procedures  . DG Hand Complete Right    Standing Status:   Future    Standing Expiration Date:   02/01/2019    Order Specific Question:   Reason for Exam (SYMPTOM  OR DIAGNOSIS REQUIRED)    Answer:   painful ring finger    Order Specific Question:   Is patient pregnant?    Answer:   No    Order Specific Question:   Preferred imaging location?    Answer:   [facility]    Order Specific Question:   Radiology Contrast Protocol - do NOT remove file path    Answer:   \\[email or web address].[email or web address]  . POCT glycosylated hemoglobin (Hb A1C)   Freddrick March, MD [facility],  PGY-2 12/06/2017 2:49 PM

## 2017-12-06 DIAGNOSIS — M7989 Other specified soft tissue disorders: Secondary | ICD-10-CM | POA: Insufficient documentation

## 2017-12-06 NOTE — Assessment & Plan Note (Signed)
Pt reports swelling and 0.5cm  "lump" of R ring finger x1 week.  She feels this is progressively getting larger and more painful.  It is tender to palpation on exam.  No known history of trauma or injury to contribute, and no obvious deformity, making fracture less likely.  Low suspicion for cellulitis given no fever/chills and no surrounding erythema.   Bump is not firm and is mobile as far as I am able to tell, however physical exam is limited 2/2 pain.  She is unable to fully grip with R hand due to discomfort.  Denies numbness, tingling.  Possible may be soft tissue mass vs bone spur.  Would advise imaging for further evaluation.  -XR R hand complete  -Aleve BID prn for pain

## 2017-12-06 NOTE — Assessment & Plan Note (Signed)
Endorses ongoing nausea and decreased appetite likely 2/2 peptic ulcer. Denies vomiting. Taking zofran with no acute worsening.  Advise continuing ppi and keeping her f/u appt with GI end of June.

## 2017-12-08 ENCOUNTER — Emergency Department (HOSPITAL_COMMUNITY)
Admission: EM | Admit: 2017-12-08 | Discharge: 2017-12-09 | Disposition: A | Discharge status: Home / Self Care | Payer: Medicaid Other | Attending: Emergency Medicine | Admitting: Emergency Medicine

## 2017-12-08 ENCOUNTER — Emergency Department (HOSPITAL_COMMUNITY): Payer: Medicaid Other

## 2017-12-08 ENCOUNTER — Encounter (HOSPITAL_COMMUNITY): Payer: Self-pay

## 2017-12-08 ENCOUNTER — Other Ambulatory Visit: Payer: Self-pay

## 2017-12-08 DIAGNOSIS — Z79899 Other long term (current) drug therapy: Secondary | ICD-10-CM | POA: Insufficient documentation

## 2017-12-08 DIAGNOSIS — E1122 Type 2 diabetes mellitus with diabetic chronic kidney disease: Secondary | ICD-10-CM | POA: Insufficient documentation

## 2017-12-08 DIAGNOSIS — Z8673 Personal history of transient ischemic attack (TIA), and cerebral infarction without residual deficits: Secondary | ICD-10-CM | POA: Insufficient documentation

## 2017-12-08 DIAGNOSIS — F99 Mental disorder, not otherwise specified: Secondary | ICD-10-CM | POA: Diagnosis present

## 2017-12-08 DIAGNOSIS — F329 Major depressive disorder, single episode, unspecified: Secondary | ICD-10-CM | POA: Diagnosis not present

## 2017-12-08 DIAGNOSIS — R45851 Suicidal ideations: Secondary | ICD-10-CM | POA: Diagnosis not present

## 2017-12-08 DIAGNOSIS — F4323 Adjustment disorder with mixed anxiety and depressed mood: Secondary | ICD-10-CM | POA: Insufficient documentation

## 2017-12-08 DIAGNOSIS — I129 Hypertensive chronic kidney disease with stage 1 through stage 4 chronic kidney disease, or unspecified chronic kidney disease: Secondary | ICD-10-CM | POA: Diagnosis not present

## 2017-12-08 DIAGNOSIS — N183 Chronic kidney disease, stage 3 (moderate): Secondary | ICD-10-CM | POA: Diagnosis not present

## 2017-12-08 LAB — CBC
HEMATOCRIT: 36.8 % (ref 36.0–46.0)
Hemoglobin: 11.9 g/dL — ABNORMAL LOW (ref 12.0–15.0)
MCH: 25.6 pg — AB (ref 26.0–34.0)
MCHC: 32.3 g/dL (ref 30.0–36.0)
MCV: 79.3 fL (ref 78.0–100.0)
Platelets: 450 10*3/uL — ABNORMAL HIGH (ref 150–400)
RBC: 4.64 MIL/uL (ref 3.87–5.11)
RDW: 14.5 % (ref 11.5–15.5)
WBC: 9.6 10*3/uL (ref 4.0–10.5)

## 2017-12-08 LAB — ETHANOL

## 2017-12-08 LAB — SALICYLATE LEVEL: Salicylate Lvl: <7 mg/dL (ref 2.8–30.0)

## 2017-12-08 LAB — BASIC METABOLIC PANEL
Anion gap: 11 (ref 5–15)
BUN: 11 mg/dL (ref 6–20)
CO2: 22 mmol/L (ref 22–32)
Calcium: 9.6 mg/dL (ref 8.9–10.3)
Chloride: 109 mmol/L (ref 101–111)
Creatinine, Ser: 1.1 mg/dL — ABNORMAL HIGH (ref 0.44–1.00)
GFR calc Af Amer: >60 mL/min (ref >60)
GFR, EST NON AFRICAN AMERICAN: 55 mL/min — AB (ref >60)
GLUCOSE: 142 mg/dL — AB (ref 65–99)
POTASSIUM: 4.4 mmol/L (ref 3.5–5.1)
Sodium: 142 mmol/L (ref 135–145)

## 2017-12-08 LAB — ACETAMINOPHEN LEVEL: Acetaminophen (Tylenol), Serum: <10 ug/mL — ABNORMAL LOW (ref 10–30)

## 2017-12-08 LAB — I-STAT TROPONIN, ED: Troponin i, poc: 0 ng/mL (ref 0.00–0.08)

## 2017-12-08 NOTE — ED Triage Notes (Signed)
Pt arrived via [facility] from home with c/o vomiting starting at 3 PM and then a sudden onset of CP around 4 PM. Pt rpeorts left chest pain with radiation to left arm and back. Given 1 Nitro PTA dropped BP to 98/66, no more Nitro given. Pain went from 8/10 to 7/10.   Pt states that she was here in February for 2 peptic ulcers, states that she has still be having symptoms. Pt reports vomiting 4 times today with diarrhea "always.

## 2017-12-08 NOTE — ED Notes (Signed)
Requested UA again; patient states that she has not voided since yesterday. Asked patient if we could I/O cath - patient agreed. Asked Rolly Salter, tech to cath.

## 2017-12-09 LAB — RAPID URINE DRUG SCREEN, HOSP PERFORMED
AMPHETAMINES: NOT DETECTED
BARBITURATES: NOT DETECTED
BENZODIAZEPINES: NOT DETECTED
Cocaine: NOT DETECTED
Opiates: NOT DETECTED
TETRAHYDROCANNABINOL: NOT DETECTED

## 2017-12-09 LAB — I-STAT TROPONIN, ED: TROPONIN I, POC: 0.02 ng/mL (ref 0.00–0.08)

## 2017-12-09 LAB — I-STAT BETA HCG BLOOD, ED (MC, WL, AP ONLY): I-stat hCG, quantitative: 11.9 m[IU]/mL — ABNORMAL HIGH (ref <5)

## 2017-12-09 MED ORDER — GI COCKTAIL ~~LOC~~
30.0000 mL | Freq: Once | ORAL | Status: AC
  Administered 2017-12-09: 30 mL via ORAL
  Filled 2017-12-09: qty 30
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
## 2017-12-09 NOTE — ED Notes (Signed)
Pt belongings inventoried and [facility] in [facility]. No valuables to Security or medications to Pharmacy.

## 2017-12-09 NOTE — ED Notes (Addendum)
Pt denies present suicidal thoughts, but states that "it comes and goes sometimes." States the last time she had any thoughts of hurting herself was a "few days ago". States that she stopped taking her depression meds that she was put on by staff [facility], but has not sought follow up care or counseling.   Pt reminded of need for urine sample, but pt states she hasn't needed to urinate since yesterday. States she often goes more than a day without urinating for a few months now, since she's had consistent diarrhea from her IBS.

## 2017-12-09 NOTE — Discharge Instructions (Addendum)
Please follow-up with the outpatient program as directed.

## 2017-12-09 NOTE — ED Provider Notes (Signed)
[facility] EMERGENCY DEPARTMENT Provider Note   CSN: [id number] Arrival date & time: 12/08/17  1839     History   Chief Complaint Chief Complaint  Patient presents with  . Chest Pain  . Suicidal    HPI Yesenia [name] is a 57 y.o. female.  Patient presents to the emergency department with chief complaint of suicidal thoughts.  She states that she has these intermittently.  She states that she originally came because she has had chest pain.  She describes as a burning sensation in her chest and throat.  She denies any shortness of breath, arm pain, radiating pain.  She denies any other symptoms.  The history is provided by the patient. No language interpreter was used.    Past Medical History:  Diagnosis Date  . Acute stomach ulcer 08/15/2017  . AKI (acute kidney injury) ([place])    Acute kidney injury on CKD stage II-III/notes 08/17/2017  . Anemia   . Anemia   . Arthritis    "knees" (08/17/2017)  . Chronic pain   . CKD (chronic kidney disease)    Stage 3  . Depression   . Fibromyalgia    hospitilized 12/16 due to inability to walk  . Gastric ulcer   . History of gastritis 2019  . Hyperlipemia   . Hypertension   . Insomnia   . Neuropathy   . Right sided weakness   . Sleep apnea    "CPAPs didn't work for me; that's why they did OR" (08/17/2017)  . Stroke [facility]) 2014?   no residual weakness   . Type II diabetes mellitus ([place])     does not check CBG's (08/17/2017)  . Vitamin deficiency    Vit D    Patient Active Problem List   Diagnosis Date Noted  . Swelling of right ring finger 12/06/2017  . Tremor 10/25/2017  . Dysphagia 08/16/2017  . Nausea and vomiting   . Abdominal pain, epigastric   . MDD (major depressive disorder), recurrent severe, without psychosis ([place])   . AKI (acute kidney injury) ([place]) 08/15/2017  . Sleep apnea 03/20/2017  . Acute on chronic respiratory failure with hypoxemia ([place]) 09/30/2016  . Acute tracheitis 09/30/2016  . Abscess    . Complication of tracheostomy ([place]) 06/23/2016  . Bleeding 06/23/2016  . Iron deficiency anemia 05/26/2016  . Tracheostomy status ([place]) 05/23/2016  . Irritable bowel syndrome 05/21/2016  . Anxiety state 05/21/2016  . Tracheostomy dependent ([place])   . Generalized weakness 04/13/2016  . Weakness generalized 04/13/2016  . HCAP (healthcare-associated pneumonia) 03/06/2016  . Fibromyalgia 02/22/2016  . Cellulitis 02/21/2016  . Abdominal pain 02/16/2016  . Constipation 02/16/2016  . CKD (chronic kidney disease) stage 3, GFR 30-59 ml/min (HCC) 02/16/2016  . Fever   . Cough   . OSA (obstructive sleep apnea) 02/13/2016  . Insomnia 08/10/2015  . Polyneuropathy 07/10/2015  . GERD (gastroesophageal reflux disease) 07/10/2015  . Acute kidney injury superimposed on chronic kidney disease ([place]) 07/07/2015  . Gait disturbance 07/07/2015  . Morbid (severe) obesity due to excess calories ([place]) complicated by OSA 80/09/4915  . Left hip pain 07/06/2015  . Chronic pain   . Right sided weakness   . Headache(784.0) 04/14/2013  . Encephalopathy acute 04/11/2013  . Type 2 diabetes mellitus with stage 3 chronic kidney disease, without long-term current use of insulin ([place]) 04/11/2013  . Dyslipidemia 04/11/2013  . Chest pain 02/15/2013  . Cerebral infarction ([place]) 01/14/2013  . Essential hypertension 01/14/2013  . Anemia 01/14/2013  Past Surgical History:  Procedure Laterality Date  . ESOPHAGOGASTRODUODENOSCOPY (EGD) WITH PROPOFOL N/A 08/16/2017   Procedure: ESOPHAGOGASTRODUODENOSCOPY (EGD) WITH PROPOFOL;  Surgeon: Yetta Flock, MD;  Location: [facility];  Service: Gastroenterology;  Laterality: N/A;  . [place]   "stuck rods in it"  . KNEE ARTHROSCOPY Right 1992, 1995,  . OOPHORECTOMY Right 1993?  . TRACHEOSTOMY TUBE PLACEMENT N/A 02/13/2016   Procedure: TRACHEOSTOMY;  Surgeon: Melida Quitter, MD;  Location: [facility];  Service: ENT;  Laterality: N/A;  . TUBAL LIGATION        OB History   None      Home Medications    Prior to Admission medications   Medication Sig Start Date End Date Taking? Authorizing Provider  amLODipine (NORVASC) 10 MG tablet Take 10 mg by mouth daily. 09/07/16  Yes [provider]  atenolol (TENORMIN) 50 MG tablet Take 50 mg by mouth daily. 02/16/16  Yes [provider]  DULoxetine (CYMBALTA) 60 MG capsule Take 60 mg by mouth daily.    Yes [provider]  gabapentin (NEURONTIN) 300 MG capsule Take 900 mg by mouth at bedtime. 08/01/17  Yes [provider]  hydrOXYzine (ATARAX/VISTARIL) 25 MG tablet Take 25 mg by mouth at bedtime. 09/23/17  Yes [provider]  hyoscyamine (LEVSIN SL) 0.125 MG SL tablet TAKE 0.125 mg TABLET BY MOUTH FOUR TIMES DAILY FOR IBS 04/15/17  Yes [provider]  LORazepam (ATIVAN) 0.5 MG tablet Take 0.5 mg by mouth at bedtime.  10/05/17  Yes [provider]  montelukast (SINGULAIR) 10 MG tablet Take 10 mg by mouth at bedtime.  10/05/17  Yes [provider]  Omega-3 Fatty Acids (FISH OIL) 1000 MG CAPS Take 1,000 mg by mouth daily.  09/10/17  Yes [provider]  sitaGLIPtin (JANUVIA) 25 MG tablet Take 25 mg by mouth daily. 09/10/17  Yes [provider]  traZODone (DESYREL) 50 MG tablet Take 50 mg by mouth at bedtime. 09/10/17  Yes [provider]  Vitamin D, Ergocalciferol, (DRISDOL) 50000 units CAPS capsule Take 50,000 Units by mouth every 7 (seven) days. On Monday 02/18/17  Yes [provider]  Na Sulfate-K Sulfate-Mg Sulf 17.5-3.13-1.6 GM/177ML SOLN Take 1 kit as directed by mouth. Patient not taking: Reported on 10/19/2017 05/23/17   Yetta Flock, MD  pantoprazole (PROTONIX) 40 MG tablet Take 1 tablet (40 mg total) by mouth 2 (two) times daily. Patient not taking: Reported on 12/09/2017 08/21/17   Kayleen Memos, DO  promethazine (PHENERGAN) 12.5 MG tablet Take 1 tablet (12.5 mg total) by mouth every 6 (six)  hours. Patient not taking: Reported on 12/09/2017 08/21/17   Kayleen Memos, DO    Family History Family History  Problem Relation Age of Onset  . Breast cancer Mother   . Leukemia Brother   . Breast cancer Maternal Grandmother     Social History Social History   Tobacco Use  . Smoking status: Never Smoker  . Smokeless tobacco: Never Used  Substance Use Topics  . Alcohol use: No    Alcohol/week: 0.0 oz  . Drug use: No     Allergies   Buprenorphine hcl and Morphine and related   Review of Systems Review of Systems  All other systems reviewed and are negative.    Physical Exam Updated Vital Signs BP 96/73 (BP Location: Right Arm)   Pulse 81   Temp 98.6 F (37 C) (Oral)   Resp 18   Ht 5'  7" (1.702 m)   Wt 134.7 kg (297 lb)   SpO2 96%   BMI 46.52 kg/m   Physical Exam  Constitutional: She is oriented to person, place, and time. She appears well-developed and well-nourished.  HENT:  Head: Normocephalic and atraumatic.  Eyes: Pupils are equal, round, and reactive to light. Conjunctivae and EOM are normal.  Neck: Normal range of motion. Neck supple.  Cardiovascular: Normal rate and regular rhythm. Exam reveals no gallop and no friction rub.  No murmur heard. Pulmonary/Chest: Effort normal and breath sounds normal. No respiratory distress. She has no wheezes. She has no rales. She exhibits no tenderness.  Abdominal: Soft. Bowel sounds are normal. She exhibits no distension and no mass. There is no tenderness. There is no rebound and no guarding.  Musculoskeletal: Normal range of motion. She exhibits no edema or tenderness.  Neurological: She is alert and oriented to person, place, and time.  Skin: Skin is warm and dry.  Psychiatric: She has a normal mood and affect. Her behavior is normal. Judgment and thought content normal.  Nursing note and vitals reviewed.    ED Treatments / Results  Labs (all labs ordered are listed, but only abnormal results are  displayed) Labs Reviewed  BASIC METABOLIC PANEL - Abnormal; Notable for the following components:      Result Value   Glucose, Bld 142 (*)    Creatinine, Ser 1.10 (*)    GFR calc non Af Amer 55 (*)    All other components within normal limits  CBC - Abnormal; Notable for the following components:   Hemoglobin 11.9 (*)    MCH 25.6 (*)    Platelets 450 (*)    All other components within normal limits  ACETAMINOPHEN LEVEL - Abnormal; Notable for the following components:   Acetaminophen (Tylenol), Serum <10 (*)    All other components within normal limits  ETHANOL  SALICYLATE LEVEL  RAPID URINE DRUG SCREEN, HOSP PERFORMED  I-STAT TROPONIN, ED  I-STAT TROPONIN, ED  I-STAT BETA HCG BLOOD, ED ([facility], [facility], [facility] ONLY)    EKG None  Radiology Dg Chest 2 View  Result Date: 12/08/2017 CLINICAL DATA:  Shortness of breath, chest pain EXAM: CHEST - 2 VIEW COMPARISON:  10/19/2017 FINDINGS: Lungs are clear.  No pleural effusion or pneumothorax. The heart is normal in size. Visualized osseous structures are within normal limits. IMPRESSION: Normal chest radiographs. Electronically Signed   By: Julian Hy M.D.   On: 12/08/2017 21:16    Procedures Procedures (including critical care time)  Medications Ordered in ED Medications  gi cocktail (Maalox,Lidocaine,Donnatal) (30 mLs Oral Given 12/09/17 0414)     Initial Impression / Assessment and Plan / ED Course  I have reviewed the triage vital signs and the nursing notes.  Pertinent labs & imaging results that were available during my care of the patient were reviewed by me and considered in my medical decision making (see chart for details).     Patient came to the ER for chest pain.  Symptoms started yesterday afternoon.  Patient describes the symptoms as a burning sensation in her throat.  She reports the same sensation when she swallows.  She denies any radiating pain.  Doubt ACS.  Symptoms sound consistent with GERD.  Troponin and delta  troponin are negative.  Patient is medically clear.  TTS evaluation pending.  Final Clinical Impressions(s) / ED Diagnoses   Final diagnoses:  None    ED Discharge Orders    None  Montine Circle, PA-C 12/09/17 0425    Orpah Greek, MD 12/09/17 [phone]

## 2017-12-09 NOTE — ED Notes (Addendum)
All personal items given to patient at this time ; all items accounted for and inventory sheet signed ;  Meal given at this time

## 2017-12-09 NOTE — ED Notes (Signed)
TTS device at bedside, awaiting assessment.  

## 2017-12-09 NOTE — ED Notes (Signed)
Outpt psych resources given to pt

## 2017-12-09 NOTE — ED Notes (Signed)
ED Provider at bedside. 

## 2017-12-09 NOTE — ED Provider Notes (Signed)
TTS recommends intensive outpatient treatment.  Patient agreeable.   Roxy HorsemanBrowning, Zakk Borgen, PA-C 12/09/17 [phone]    Gilda CreasePollina, Christopher J, MD 12/09/17 2300

## 2017-12-10 ENCOUNTER — Emergency Department (HOSPITAL_COMMUNITY)
Admission: EM | Admit: 2017-12-10 | Discharge: 2017-12-10 | Disposition: A | Discharge status: Home / Self Care | Payer: Medicaid Other | Attending: Emergency Medicine | Admitting: Emergency Medicine

## 2017-12-10 ENCOUNTER — Other Ambulatory Visit: Payer: Self-pay

## 2017-12-10 ENCOUNTER — Encounter (HOSPITAL_COMMUNITY): Payer: Self-pay | Admitting: Emergency Medicine

## 2017-12-10 ENCOUNTER — Emergency Department (HOSPITAL_COMMUNITY): Payer: Medicaid Other

## 2017-12-10 DIAGNOSIS — N183 Chronic kidney disease, stage 3 (moderate): Secondary | ICD-10-CM | POA: Diagnosis not present

## 2017-12-10 DIAGNOSIS — I129 Hypertensive chronic kidney disease with stage 1 through stage 4 chronic kidney disease, or unspecified chronic kidney disease: Secondary | ICD-10-CM | POA: Insufficient documentation

## 2017-12-10 DIAGNOSIS — Z8673 Personal history of transient ischemic attack (TIA), and cerebral infarction without residual deficits: Secondary | ICD-10-CM | POA: Insufficient documentation

## 2017-12-10 DIAGNOSIS — E1122 Type 2 diabetes mellitus with diabetic chronic kidney disease: Secondary | ICD-10-CM | POA: Diagnosis not present

## 2017-12-10 DIAGNOSIS — M79641 Pain in right hand: Secondary | ICD-10-CM | POA: Diagnosis not present

## 2017-12-10 DIAGNOSIS — Z79899 Other long term (current) drug therapy: Secondary | ICD-10-CM | POA: Diagnosis not present

## 2017-12-10 NOTE — ED Notes (Signed)
Pt verbalized understanding discharge instructions and denies any further needs or questions at this time. VS stable, ambulatory and steady gait.   

## 2017-12-10 NOTE — ED Provider Notes (Signed)
[facility] EMERGENCY DEPARTMENT Provider Note   CSN: [id number] Arrival date & time: 12/10/17  1056     History   Chief Complaint Chief Complaint  Patient presents with  . Hand Pain    HPI Yesenia [name] is a 57 y.o. female with a hx of gastric ulcers, CKD, fibromyalgia, HTN, T2DM, and hyperlipidemia who presents to the ED at request of PCP for R hand x-ray today. Patient states she has had intermittent pain/swelling to her R hand/wrist x 1 year. States sxs come and go, no specific triggers. Once sxs are present - worse with movement. She has some associated paresthesias to the 4th/5th digit at times, not completely numb. Denies traumatic injury. Denies fever or chills. She states she was seen by PCP sent to ER for x-ray and that she did not have to stay afterwards.   HPI  Past Medical History:  Diagnosis Date  . Acute stomach ulcer 08/15/2017  . AKI (acute kidney injury) ([place])    Acute kidney injury on CKD stage II-III/notes 08/17/2017  . Anemia   . Anemia   . Arthritis    "knees" (08/17/2017)  . Chronic pain   . CKD (chronic kidney disease)    Stage 3  . Depression   . Fibromyalgia    hospitilized 12/16 due to inability to walk  . Gastric ulcer   . History of gastritis 2019  . Hyperlipemia   . Hypertension   . Insomnia   . Neuropathy   . Right sided weakness   . Sleep apnea    "CPAPs didn't work for me; that's why they did OR" (08/17/2017)  . Stroke [facility]) 2014?   no residual weakness   . Type II diabetes mellitus ([place])     does not check CBG's (08/17/2017)  . Vitamin deficiency    Vit D    Patient Active Problem List   Diagnosis Date Noted  . Swelling of right ring finger 12/06/2017  . Tremor 10/25/2017  . Dysphagia 08/16/2017  . Nausea and vomiting   . Abdominal pain, epigastric   . MDD (major depressive disorder), recurrent severe, without psychosis ([place])   . AKI (acute kidney injury) ([place]) 08/15/2017  . Sleep apnea 03/20/2017  . Acute on  chronic respiratory failure with hypoxemia ([place]) 09/30/2016  . Acute tracheitis 09/30/2016  . Abscess   . Complication of tracheostomy ([place]) 06/23/2016  . Bleeding 06/23/2016  . Iron deficiency anemia 05/26/2016  . Tracheostomy status ([place]) 05/23/2016  . Irritable bowel syndrome 05/21/2016  . Anxiety state 05/21/2016  . Tracheostomy dependent ([place])   . Generalized weakness 04/13/2016  . Weakness generalized 04/13/2016  . HCAP (healthcare-associated pneumonia) 03/06/2016  . Fibromyalgia 02/22/2016  . Cellulitis 02/21/2016  . Abdominal pain 02/16/2016  . Constipation 02/16/2016  . CKD (chronic kidney disease) stage 3, GFR 30-59 ml/min (HCC) 02/16/2016  . Fever   . Cough   . OSA (obstructive sleep apnea) 02/13/2016  . Insomnia 08/10/2015  . Polyneuropathy 07/10/2015  . GERD (gastroesophageal reflux disease) 07/10/2015  . Acute kidney injury superimposed on chronic kidney disease ([place]) 07/07/2015  . Gait disturbance 07/07/2015  . Morbid (severe) obesity due to excess calories ([place]) complicated by OSA 53/29/9242  . Left hip pain 07/06/2015  . Chronic pain   . Right sided weakness   . Headache(784.0) 04/14/2013  . Encephalopathy acute 04/11/2013  . Type 2 diabetes mellitus with stage 3 chronic kidney disease, without long-term current use of insulin ([place]) 04/11/2013  . Dyslipidemia 04/11/2013  .  Chest pain 02/15/2013  . Cerebral infarction ([place]) 01/14/2013  . Essential hypertension 01/14/2013  . Anemia 01/14/2013    Past Surgical History:  Procedure Laterality Date  . ESOPHAGOGASTRODUODENOSCOPY (EGD) WITH PROPOFOL N/A 08/16/2017   Procedure: ESOPHAGOGASTRODUODENOSCOPY (EGD) WITH PROPOFOL;  Surgeon: Yetta Flock, MD;  Location: [facility];  Service: Gastroenterology;  Laterality: N/A;  . [place]   "stuck rods in it"  . KNEE ARTHROSCOPY Right 1992, 1995,  . OOPHORECTOMY Right 1993?  . TRACHEOSTOMY TUBE PLACEMENT N/A 02/13/2016   Procedure: TRACHEOSTOMY;   Surgeon: Melida Quitter, MD;  Location: [facility];  Service: ENT;  Laterality: N/A;  . TUBAL LIGATION       OB History   None      Home Medications    Prior to Admission medications   Medication Sig Start Date End Date Taking? Authorizing Provider  amLODipine (NORVASC) 10 MG tablet Take 10 mg by mouth daily. 09/07/16   [provider]  atenolol (TENORMIN) 50 MG tablet Take 50 mg by mouth daily. 02/16/16   [provider]  DULoxetine (CYMBALTA) 60 MG capsule Take 60 mg by mouth daily.     [provider]  gabapentin (NEURONTIN) 300 MG capsule Take 900 mg by mouth at bedtime. 08/01/17   [provider]  hydrOXYzine (ATARAX/VISTARIL) 25 MG tablet Take 25 mg by mouth at bedtime. 09/23/17   [provider]  hyoscyamine (LEVSIN SL) 0.125 MG SL tablet TAKE 0.125 mg TABLET BY MOUTH FOUR TIMES DAILY FOR IBS 04/15/17   [provider]  LORazepam (ATIVAN) 0.5 MG tablet Take 0.5 mg by mouth at bedtime.  10/05/17   [provider]  montelukast (SINGULAIR) 10 MG tablet Take 10 mg by mouth at bedtime.  10/05/17   [provider]  Na Sulfate-K Sulfate-Mg Sulf 17.5-3.13-1.6 GM/177ML SOLN Take 1 kit as directed by mouth. Patient not taking: Reported on 10/19/2017 05/23/17   Yetta Flock, MD  Omega-3 Fatty Acids (FISH OIL) 1000 MG CAPS Take 1,000 mg by mouth daily.  09/10/17   [provider]  pantoprazole (PROTONIX) 40 MG tablet Take 1 tablet (40 mg total) by mouth 2 (two) times daily. Patient not taking: Reported on 12/09/2017 08/21/17   Kayleen Memos, DO  promethazine (PHENERGAN) 12.5 MG tablet Take 1 tablet (12.5 mg total) by mouth every 6 (six) hours. Patient not taking: Reported on 12/09/2017 08/21/17   Kayleen Memos, DO  sitaGLIPtin (JANUVIA) 25 MG tablet Take 25 mg by mouth daily. 09/10/17   [provider]  traZODone (DESYREL) 50 MG tablet Take 50 mg by mouth at bedtime. 09/10/17   [provider]  Vitamin D,  Ergocalciferol, (DRISDOL) 50000 units CAPS capsule Take 50,000 Units by mouth every 7 (seven) days. On Monday 02/18/17   [provider]    Family History Family History  Problem Relation Age of Onset  . Breast cancer Mother   . Leukemia Brother   . Breast cancer Maternal Grandmother     Social History Social History   Tobacco Use  . Smoking status: Never Smoker  . Smokeless tobacco: Never Used  Substance Use Topics  . Alcohol use: No    Alcohol/week: 0.0 oz  . Drug use: No     Allergies   Buprenorphine hcl and Morphine and related   Review of Systems Review of Systems  Constitutional: Negative for chills and fever.  Musculoskeletal: Positive for arthralgias (R wrist/hand).  Skin: Negative for color change, rash  and wound.  Neurological: Negative for weakness and numbness.       Positive for paresthesias     Physical Exam Updated Vital Signs BP 122/89 (BP Location: Left Arm)   Pulse 88   Temp 98.3 F (36.8 C) (Oral)   Resp 18   SpO2 97%   Physical Exam  Constitutional: She appears well-developed and well-nourished. No distress.  HENT:  Head: Normocephalic and atraumatic.  Eyes: Conjunctivae are normal. Right eye exhibits no discharge. Left eye exhibits no discharge.  Cardiovascular:  2+ symmetric radial pulses.   Musculoskeletal:  No obvious deformity, appreciable swelling, erythema, ecchymosis, or open wounds.  Normal ROM to bilateral elbows, wrists, and all digits. She has some discomfort with wrist ROM. She is tender to palpation over the dorsum of the R wrist and and R dorsum of the hand extending to 4th/5th digits. NVI distally. Negative tinels of median/ulnar nerve.   Neurological: She is alert.  Clear speech. Sensation grossly intact to bilateral upper extremities. 5/5 symmetric grip strength. Able to perform thumbs up, OK sign, and cross 2nd/3rd digits bilaterally.   Psychiatric: She has a normal mood and affect. Her behavior is normal.  Thought content normal.  Nursing note and vitals reviewed.    ED Treatments / Results  Labs (all labs ordered are listed, but only abnormal results are displayed) Labs Reviewed - No data to display  EKG None  Radiology   Procedures Procedures (including critical care time)  Medications Ordered in ED Medications - No data to display   Initial Impression / Assessment and Plan / ED Course  I have reviewed the triage vital signs and the nursing notes.  Pertinent labs & imaging results that were available during my care of the patient were reviewed by me and considered in my medical decision making (see chart for details).   Patient presents with atraumatic R wrist/hand pain/swelling x 1 year. Patient nontoxic appearing, in no apparent distress, vitals WNL. X-ray of wrist obtained per triage negative for fracture/dislocation, patient was directed to obtain hand x-ray, do not feel add on wrist necessary given seems more prominent in hand. No overlying erythema/warmth, afebrile, doubt infectious etiology. NVI distally. Will place in splint with hand follow up. I discussed results, treatment plan, need for follow-up, and return precautions with the patient. Provided opportunity for questions, patient confirmed understanding and is in agreement with plan.    Final Clinical Impressions(s) / ED Diagnoses   Final diagnoses:  Right hand pain    ED Discharge Orders    None       Amaryllis Dyke, PA-C 12/10/17 1410    Isla Pence, MD 12/10/17 1413

## 2017-12-10 NOTE — ED Triage Notes (Signed)
Patient states she was sent here by her doctor for an x-ray of her R hand - reports swelling x 1 year, pain that comes and goes. Full ROM.

## 2017-12-10 NOTE — ED Notes (Signed)
Patient transported to X-ray 

## 2017-12-10 NOTE — Discharge Instructions (Addendum)
Please read and follow all provided instructions.  You have been seen today for pain/swelling in your R hand.   Tests performed today include: An x-ray of the affected area - does NOT show any broken bones or dislocations.  Vital signs. See below for your results today.   Wear the brace as needed for comfort.   Follow-up instructions: Please follow-up with your primary care provider or the provided orthopedic physician (bone specialist) if you continue to have significant pain in 1 week. In this case you may have a more severe injury that requires further care.   Return instructions:  Please return if your toes or hand or fingers are completley numb, appear gray or blue, or you have severe pain (also elevate the hand and loosen splint or wrap if you were given one) Please return to the Emergency Department if you experience worsening symptoms.  Please return if you have any other emergent concerns. Additional Information:  Your vital signs today were: BP 122/89 (BP Location: Left Arm)    Pulse 88    Temp 98.3 F (36.8 C) (Oral)    Resp 18    SpO2 97%  If your blood pressure (BP) was elevated above 135/85 this visit, please have this repeated by your doctor within one month. ---------------

## 2017-12-20 ENCOUNTER — Encounter: Payer: Self-pay | Admitting: Neurology

## 2017-12-20 ENCOUNTER — Ambulatory Visit: Payer: Medicaid Other | Admitting: Neurology

## 2017-12-20 VITALS — BP 117/79 | HR 88 | Ht 66.0 in | Wt 292.0 lb

## 2017-12-20 DIAGNOSIS — F39 Unspecified mood [affective] disorder: Secondary | ICD-10-CM | POA: Diagnosis not present

## 2017-12-20 DIAGNOSIS — G4733 Obstructive sleep apnea (adult) (pediatric): Secondary | ICD-10-CM | POA: Diagnosis not present

## 2017-12-20 DIAGNOSIS — R51 Headache: Secondary | ICD-10-CM | POA: Diagnosis not present

## 2017-12-20 DIAGNOSIS — R351 Nocturia: Secondary | ICD-10-CM

## 2017-12-20 DIAGNOSIS — G4700 Insomnia, unspecified: Secondary | ICD-10-CM | POA: Diagnosis not present

## 2017-12-20 DIAGNOSIS — Z6841 Body Mass Index (BMI) 40.0 and over, adult: Secondary | ICD-10-CM | POA: Diagnosis not present

## 2017-12-20 DIAGNOSIS — G4730 Sleep apnea, unspecified: Secondary | ICD-10-CM

## 2017-12-20 DIAGNOSIS — Z789 Other specified health status: Secondary | ICD-10-CM

## 2017-12-20 DIAGNOSIS — Z820 Family history of epilepsy and other diseases of the nervous system: Secondary | ICD-10-CM

## 2017-12-20 DIAGNOSIS — R519 Headache, unspecified: Secondary | ICD-10-CM

## 2017-12-20 NOTE — Patient Instructions (Addendum)
Thank you for choosing [facility] for your sleep related care! It was nice to meet you today! I appreciate that you entrust me with your sleep related healthcare concerns. I hope, I was able to address at least some of your concerns today, and that I can help you feel reassured and also get better.    Here is what we discussed today and what we came up with as our plan for you:    Based on your symptoms and your exam I believe you are still at risk for obstructive sleep apnea and would benefit from reevaluation as it has been 3 years and could not have the trach any longer. You have lost weight too. Therefore, I think we should proceed with a sleep study to determine how severe your sleep apnea is. If you have more than mild OSA, I want you to consider ongoing treatment with CPAP. Please remember, the risks and ramifications of moderate to severe obstructive sleep apnea or OSA are: Cardiovascular disease, including congestive heart failure, stroke, difficult to control hypertension, arrhythmias, and even type 2 diabetes has been linked to untreated OSA. Sleep apnea causes disruption of sleep and sleep deprivation in most cases, which, in turn, can cause recurrent headaches, problems with memory, mood, concentration, focus, and vigilance. Most people with untreated sleep apnea report excessive daytime sleepiness, which can affect their ability to drive. Please do not drive if you feel sleepy.   Please bring your night time meds for the study. Try to keep a schedule for your sleep as best as possible.  I will likely see you back after your sleep study to go over the test results and where to go from there. We will call you after your sleep study to advise about the results (most likely, you will hear from GradyKristen, my nurse) and to set up an appointment at the time, as necessary.    Our sleep lab administrative assistant will call you to schedule your sleep study. If you don't hear back  from her by about 2 weeks from now, please feel free to call her at [phone]. You can leave a message with your phone number and concerns, if you get the voicemail box. She will call back as soon as possible.

## 2017-12-20 NOTE — Progress Notes (Signed)
Subjective:    Patient ID: Yesenia [name] is a 57 y.o. female.  HPI     Saima Athar, MD, PhD [facility] [address], [address] [address] [place],  [address]  Dear Scott,   I saw your patient, Yesenia [name], upon your request for re-evaluation of her obstructive sleep apnea. The patient is unaccompanied today. She is a 57-year-old right-handed woman with an underlying complex history of fibromyalgia, hypertension, COPD, chronic back pain, diabetes, vitamin D deficiency, anemia, PUD with admission in Feb 2019, recurrent headaches (for which she had seen Dr. Jaffe at [facility]), mood disorder with status post behavioral admission on 09/02/17 with status post ECT x 5, and history of morbid obesity, presents for reevaluation of her obstructive sleep apnea. I have seen over 3 years ago.  I first met her on 12/18/14, at the request of her primary care provider, at which time she reported snoring, excessive daytime somnolence and difficulty with sleep maintenance. I invited her back for sleep study. She had a baseline sleep study, followed by a CPAP titration study. I reviewed her test results with her in detail today. Her baseline sleep study from 01/08/2015 showed a poor sleep efficiency of 21.4% with a long sleep latency of 238.5 minutes and wake after sleep onset of 185 minutes with moderate sleep fragmentation noted. She had an elevated arousal index. She had an increased percentage of stage II sleep, very little deep sleep at 1.7%, and a decreased percentage of REM sleep at 5.6% with a prolonged REM latency of 166.5 minutes. She had severe PLMS with an index of 62.3 per hour, resulting in only 2.6 arousals per hour. She had no significant EKG or EEG changes. She had mild to moderate snoring. Total AHI was 52.5 per hour, rising to 73.8 per hour during REM sleep. Average oxygen saturation was 93%, nadir was 87%. Based on her test results I invited her back for a CPAP  titration study. She had this on 02/17/2015. Sleep efficiency was 61.9%, latency to sleep was 87 minutes and wake after sleep onset was 67.5 minutes with moderate, then mild sleep fragmentation noted. She had a normal arousal index. She had an increased percentage of stage II sleep, absence of slow-wave sleep and a normal percentage of REM sleep with a mildly prolonged REM latency of 137.5 minutes. She had no significant PLMS, EKG or EEG changes. She had an average oxygen saturation of 94%, nadir was 84%, CPAP was titrated from 5 cm to 9 cm. Her AHI was 4.9 per hour at the final pressure with supine REM sleep achieved. Based on the test results are prescribed CPAP therapy for home use at a pressure of 10 cm.  She was seen in the interim by Megan Millikan, nurse practitioner, on 05/14/2015, at which time she reported difficulty tolerating CPAP. She had a follow-up appointment with Megan Millikan, NP on 08/14/2015, at which time patient reported that she was unable to continue with CPAP therapy. She was referred to ENT for alternative surgical options for sleep apnea management. She was seen by Dr. Bates. She ended up having a tracheostomy in August 2017. She had subsequent reversal of the tracheostomy last year due to ongoing issues with the trach, including bleeding. She reports she has severe difficulty sleeping at night, which may date to her childhood even. She has no hx of shift work. She tries to be in bed around midnight. She watch TV in bed. Nevertheless, she has significant difficulty   sleeping at night, has rumination of thoughts, worries about multiple stressors. She dozes off and on during the day. She does not have consistent sleep pattern unfortunately. She has nocturia 3-4 times per average night. She has woken up with a headache at times. She would be willing to try CPAP again. She has lost weight in the context of peptic ulcer disease and loss of appetite. She has a history of claustrophobia and  had difficulty tolerating the fullface mask in the past.  Previously:  12/18/2014: She reports difficulty with her sleep, snoring and excessive daytime somnolence. I reviewed your office note from 11/01/2014. Of note, she presented to the emergency room on 09/24/2014 with the faculty with her speech and balance problems ongoing for 3 days. She was noted to have stuttering. A brain MRI was negative for any acute changes. Apparently, she has had prior emergency room presentations in the recent couple of years with similar presentations and reassuring brain MRI findings. She's currently on Lyrica 50 mg 3 times a day, trazodone 100 mg at night, and Belsomra 15 mg daily, as far as potentially sedating medications are concerned. She is no longer on Ambien. She reports significant nocturia up to 45 times each night. She occasionally wakes up with a headache. She has snoring and abnormal sounds in her sleep and has woken herself up with a sense of gasping for air. She has a maternal aunt with obstructive sleep apnea who uses a CPAP machine. The patient also endorses restless leg symptoms and twitching in her sleep and she has been told by her boyfriend that she kicks in her sleep. She is a nonsmoker. She does not drink alcohol. She drinks caffeine occasionally, and if she drinks caffeine she has to drink it before 9 AM, otherwise she will not sleep. She has gained weight. She has had difficulty initiating and maintaining sleep for over 15 years. She has tried multiple medications.    Her Past Medical History Is Significant For: Past Medical History:  Diagnosis Date  . Acute stomach ulcer 08/15/2017  . AKI (acute kidney injury) ([place])    Acute kidney injury on CKD stage II-III/notes 08/17/2017  . Anemia   . Anemia   . Arthritis    "knees" (08/17/2017)  . Chronic pain   . CKD (chronic kidney disease)    Stage 3  . Depression   . Fibromyalgia    hospitilized 12/16 due to inability to walk  . Gastric ulcer   .  History of gastritis 2019  . Hyperlipemia   . Hypertension   . Insomnia   . Neuropathy   . Right sided weakness   . Sleep apnea    "CPAPs didn't work for me; that's why they did OR" (08/17/2017)  . Stroke [facility]) 2014?   no residual weakness   . Type II diabetes mellitus ([place])     does not check CBG's (08/17/2017)  . Vitamin deficiency    Vit D    Her Past Surgical History Is Significant For: Past Surgical History:  Procedure Laterality Date  . ESOPHAGOGASTRODUODENOSCOPY (EGD) WITH PROPOFOL N/A 08/16/2017   Procedure: ESOPHAGOGASTRODUODENOSCOPY (EGD) WITH PROPOFOL;  Surgeon: Yetta Flock, MD;  Location: [place];  Service: Gastroenterology;  Laterality: N/A;  . [place]   "stuck rods in it"  . KNEE ARTHROSCOPY Right 1992, 1995,  . OOPHORECTOMY Right 1993?  . TRACHEOSTOMY TUBE PLACEMENT N/A 02/13/2016   Procedure: TRACHEOSTOMY;  Surgeon: Melida Quitter, MD;  Location: [place];  Service: ENT;  Laterality: N/A;  . TUBAL LIGATION      Her Family History Is Significant For: Family History  Problem Relation Age of Onset  . Breast cancer Mother   . Leukemia Brother   . Breast cancer Maternal Grandmother     Her Social History Is Significant For: Social History   Socioeconomic History  . Marital status: Divorced    Spouse name: Not on file  . Number of children: 2  . Years of education: 12th   . Highest education level: Not on file  Occupational History  . Occupation: SSI  Social Needs  . Financial resource strain: Not on file  . Food insecurity:    Worry: Not on file    Inability: Not on file  . Transportation needs:    Medical: Not on file    Non-medical: Not on file  Tobacco Use  . Smoking status: Never Smoker  . Smokeless tobacco: Never Used  Substance and Sexual Activity  . Alcohol use: No    Alcohol/week: 0.0 oz  . Drug use: No  . Sexual activity: Never  Lifestyle  . Physical activity:    Days per week: Not on file    Minutes per session:  Not on file  . Stress: Not on file  Relationships  . Social connections:    Talks on phone: Not on file    Gets together: Not on file    Attends religious service: Not on file    Active member of club or organization: Not on file    Attends meetings of clubs or organizations: Not on file    Relationship status: Not on file  Other Topics Concern  . Not on file  Social History Narrative   Reports no caffeine use     Her Allergies Are:  Allergies  Allergen Reactions  . Buprenorphine Hcl Hives  . Morphine And Related Hives and Dermatitis  :   Her Current Medications Are:  Outpatient Encounter Medications as of 12/20/2017  Medication Sig  . amLODipine (NORVASC) 10 MG tablet Take 10 mg by mouth daily.  . atenolol (TENORMIN) 50 MG tablet Take 50 mg by mouth daily.  . DULoxetine (CYMBALTA) 60 MG capsule Take 60 mg by mouth daily.   . gabapentin (NEURONTIN) 300 MG capsule Take 900 mg by mouth at bedtime.  . hydrOXYzine (ATARAX/VISTARIL) 25 MG tablet Take 25 mg by mouth at bedtime.  . hyoscyamine (LEVSIN SL) 0.125 MG SL tablet TAKE 0.125 mg TABLET BY MOUTH FOUR TIMES DAILY FOR IBS  . montelukast (SINGULAIR) 10 MG tablet Take 10 mg by mouth at bedtime.   . nortriptyline (PAMELOR) 10 MG capsule Take 10 mg by mouth daily.  . Omega-3 Fatty Acids (FISH OIL) 1000 MG CAPS Take 1,000 mg by mouth daily.   . pantoprazole (PROTONIX) 40 MG tablet Take 1 tablet (40 mg total) by mouth 2 (two) times daily.  . promethazine (PHENERGAN) 12.5 MG tablet Take 1 tablet (12.5 mg total) by mouth every 6 (six) hours.  . sitaGLIPtin (JANUVIA) 25 MG tablet Take 25 mg by mouth daily.  . traZODone (DESYREL) 50 MG tablet Take 50 mg by mouth at bedtime.  . Vitamin D, Ergocalciferol, (DRISDOL) 50000 units CAPS capsule Take 50,000 Units by mouth every 7 (seven) days. On Monday  . [DISCONTINUED] LORazepam (ATIVAN) 0.5 MG tablet Take 0.5 mg by mouth at bedtime.   . [DISCONTINUED] Na Sulfate-K Sulfate-Mg Sulf  17.5-3.13-1.6 GM/177ML SOLN Take 1 kit as directed by mouth. (  Patient not taking: Reported on 10/19/2017)   No facility-administered encounter medications on file as of 12/20/2017.   :  Review of Systems:  Out of a complete 14 point review of systems, all are reviewed and negative with the exception of these symptoms as listed below:  Review of Systems  Neurological:       Pt presents today to discuss her insomnia.  Epworth Sleepiness Scale 0= would never doze 1= slight chance of dozing 2= moderate chance of dozing 3= high chance of dozing  Sitting and reading: 0 Watching TV: 2 Sitting inactive in a public place (ex. Theater or meeting): 0 As a passenger in a car for an hour without a break: 0 Lying down to rest in the afternoon: 2 Sitting and talking to someone: 0 Sitting quietly after lunch (no alcohol): 0 In a car, while stopped in traffic: 0 Total: 4     Objective:  Neurological Exam  Physical Exam Physical Examination:   Vitals:   12/20/17 1258  BP: 117/79  Pulse: 88    General Examination: The patient is a very pleasant 57 y.o. female in no acute distress. She appears well-developed and well-nourished and well groomed.   HEENT: Normocephalic, atraumatic, pupils are equal, round and reactive to light and accommodation. Extraocular tracking is good without limitation to gaze excursion or nystagmus noted. Normal smooth pursuit is noted. Hearing is grossly intact. Trach scar. Face is symmetric with normal facial animation and normal facial sensation. Speech is clear with no dysarthria noted. There is no hypophonia. There is no lip, neck/head, jaw or voice tremor. Neck is supple with full range of passive and active motion. There are no carotid bruits on auscultation. Oropharynx exam reveals: mild mouth dryness, adequate dental hygiene and moderate airway crowding, due to narrow airway entry, thicker soft palate, large uvula and tonsils in place. Mallampati is class III.  Neck size 17 7/8 in. Tongue protrudes centrally and palate elevates symmetrically.    Chest: Clear to auscultation without wheezing, rhonchi or crackles noted.  Heart: S1+S2+0, regular and normal without murmurs, rubs or gallops noted.   Abdomen: Soft, non-tender and non-distended with normal bowel sounds appreciated on auscultation.  Extremities: There is 1+ pitting edema in the distal lower extremities bilaterally.  Skin: Warm and dry without trophic changes noted. There are no varicose veins.  Musculoskeletal: exam reveals R knee swelling.    Neurologically:  Mental status: The patient is awake, alert and oriented in all 4 spheres. Her immediate and remote memory, attention, language skills and fund of knowledge are appropriate. There is no evidence of aphasia, agnosia, apraxia or anomia. Speech is clear with normal prosody and enunciation. Thought process is linear. Mood is normal and affect is constricted.  Cranial nerves II - XII are as described above under HEENT exam. In addition: shoulder shrug is normal with equal shoulder height noted. Motor exam: Normal bulk, strength and tone is noted. There is no tremor. Reflexes are 1+ in the UE and absent in the LEs. Fine motor skills and coordination: grossly intact.  Cerebellar testing: No dysmetria or intention tremor. Heel to shin is not possible for her.  Sensory exam: intact to light touch.  Gait, station and balance: She stands with difficulty. Tandem walk is not possible, gait is slow and cautious, limp on the R.   Assessment and plan:   In summary, Yesenia [name] is a 57-year old female with an underlying medical history of fibromyalgia, hypertension, COPD, chronic back pain, diabetes,   vitamin D deficiency, anemia, recurrent headaches, PUD, mood disorder with recent s/p ECT, and morbid obesity, who presents for re-consultation of her severe sleep apnea. Sleep study testing 3 years ago showed severe sleep apnea, she had  difficulty tolerating CPAP at the time. She had interim treatment with a tracheostomy for about a year but had issues with the tracheostomy. She would be willing to get retested for sleep apnea and consider CPAP therapy. I explained in particular the risks and ramifications of untreated moderate to severe OSA, especially with respect to developing cardiovascular disease down the Road, including congestive heart failure, difficult to treat hypertension, cardiac arrhythmias, or stroke. Even type 2 diabetes has, in part, been linked to untreated OSA. Symptoms of untreated OSA include daytime sleepiness, memory problems, mood irritability and mood disorder such as depression and anxiety, lack of energy, as well as recurrent headaches, especially morning headaches. We talked about trying to maintain a healthy lifestyle in general, as well as the importance of weight control. I encouraged the patient to eat healthy, exercise daily and keep well hydrated, to keep a scheduled bedtime and wake time routine, to not skip any meals and eat healthy snacks in between meals. I advised the patient not to drive when feeling sleepy.  I recommended the following at this time: sleep study with potential positive airway pressure titration. (We will score hypopneas at 4% and split the sleep study into diagnostic and treatment portion, if the estimated. 2 hour AHI is >15/h). She has difficulty sleeping at night. She would favor a daytime sleep study which we will try to arrange. I plan to see her back after the sleep test completed I answered all her questions today and the patient was in agreement.  Thank you very much for allowing me to participate in the care of this patient. If I can be of any further assistance to you please do not hesitate to call me at [phone].  Sincerely,   Saima Athar, MD, PhD  

## 2018-01-02 ENCOUNTER — Encounter (INDEPENDENT_AMBULATORY_CARE_PROVIDER_SITE_OTHER): Payer: Self-pay | Admitting: Orthopedic Surgery

## 2018-01-02 ENCOUNTER — Ambulatory Visit (INDEPENDENT_AMBULATORY_CARE_PROVIDER_SITE_OTHER): Payer: Self-pay

## 2018-01-02 ENCOUNTER — Ambulatory Visit (INDEPENDENT_AMBULATORY_CARE_PROVIDER_SITE_OTHER): Payer: Medicaid Other | Admitting: Orthopedic Surgery

## 2018-01-02 DIAGNOSIS — M25561 Pain in right knee: Secondary | ICD-10-CM

## 2018-01-02 DIAGNOSIS — G8929 Other chronic pain: Secondary | ICD-10-CM

## 2018-01-02 DIAGNOSIS — M79641 Pain in right hand: Secondary | ICD-10-CM

## 2018-01-04 ENCOUNTER — Encounter (INDEPENDENT_AMBULATORY_CARE_PROVIDER_SITE_OTHER): Payer: Self-pay | Admitting: Orthopedic Surgery

## 2018-01-04 NOTE — Progress Notes (Signed)
Office Visit Note   Patient: Yesenia [name]           Date of Birth: 04-14-1961           MRN: [id number] Visit Date: 01/02/2018 Requested by: Freddrick MarchAmin, Yashika, MD [address] [place], [place] [id number] PCP: Freddrick MarchAmin, Yashika, MD  Subjective: Chief Complaint  Patient presents with  . Right Knee - Pain  . Right Hand - Pain    HPI: Yesenia [name] is a patient with right hand pain.  She describes a cyst on the volar aspect of her fourth finger.  She also reports numbness and tingling in that finger as well as overall hand swelling.  Denies any history of injury or trauma to the right hand.  She is right-hand dominant.  Patient also describes right knee pain.  No known history of injury to the knee.  She has had 3 surgeries on the right knee the last one was in 1999.  [place].  She reports weakness and giving way along with popping and locking.  She ambulates with pain.  Takes trazodone to help her sleep at night.              ROS: All systems reviewed are negative as they relate to the chief complaint within the history of present illness.  Patient denies  fevers or chills.   Assessment & Plan: Visit Diagnoses:  1. Chronic pain of right knee   2. Right hand pain     Plan: Pression is right hand possible volar cyst around the PIP joint.  This may be close to nerve.  I cannot really localize it or visualize it well using the ultrasound.  Need MRI of that hand to evaluate that swelling in that fourth finger around the volar cyst.  In regards to the knee she has end-stage knee arthritis but very high BMI.  Currently she is at 5447.  Weight 297 height 5 7.  1 month ago diagnosed with diabetes.  Needs to get that BMI down to 40 to consider knee replacement surgery.  I will see her back after MRI scan  Follow-Up Instructions: Return for after MRI.   Orders:  Orders Placed This Encounter  Procedures  . XR Hand Complete Right  . XR Knee 1-2 Views Right  . MR HAND RIGHT WO  CONTRAST   No orders of the defined types were placed in this encounter.     Procedures: No procedures performed   Clinical Data: No additional findings.  Objective: Vital Signs: There were no vitals taken for this visit.  Physical Exam:   Constitutional: Patient appears well-developed HEENT:  Head: Normocephalic Eyes:EOM are normal Neck: Normal range of motion Cardiovascular: Normal rate Pulmonary/chest: Effort normal Neurologic: Patient is alert Skin: Skin is warm Psychiatric: Patient has normal mood and affect    Ortho Exam: Ortho exam demonstrates tenderness at the PIP joint of the fourth finger.  There are some paresthesias in the distal tip of that finger.  Grip strength otherwise intact.  Radial pulse intact.  Wrist range of motion normal.  No snuffbox tenderness or tenderness over the first dorsal compartment.  Examination of the right knee demonstrates about a 5 degree flexion contracture with intact extensor mechanism trace effusion medial and lateral joint line tenderness and stable collateral and cruciate ligaments with no groin pain with internal/external rotation of the leg.  Range of motion pretty easily past 90 but she does have pain to palpation  around the patella and around the joint lines bilaterally  Specialty Comments:  No specialty comments available.  Imaging: No results found.   PMFS History: Patient Active Problem List   Diagnosis Date Noted  . Swelling of right ring finger 12/06/2017  . Tremor 10/25/2017  . Dysphagia 08/16/2017  . Nausea and vomiting   . Abdominal pain, epigastric   . MDD (major depressive disorder), recurrent severe, without psychosis ([facility])   . AKI (acute kidney injury) ([facility]) 08/15/2017  . Sleep apnea 03/20/2017  . Acute on chronic respiratory failure with hypoxemia ([facility]) 09/30/2016  . Acute tracheitis 09/30/2016  . Abscess   . Complication of tracheostomy ([facility]CC) 06/23/2016  . Bleeding 06/23/2016  . Iron deficiency  anemia 05/26/2016  . 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 Irritable bowel syndrome 05/21/2016  . Anxiety state 05/21/2016  . Tracheostomy dependent (HCC)   . Generalized weakness 04/13/2016  . Weakness generalized 04/13/2016  . HCAP (healthcare-associated pneumonia) 03/06/2016  . Fibromyalgia 02/22/2016  . Cellulitis 02/21/2016  . Abdominal pain 02/16/2016  . Constipation 02/16/2016  . CKD (chronic kidney disease) stage 3, GFR 30-59 ml/min ([facility]CC) 02/16/2016  . Fever   . Cough   . OSA (obstructive sleep apnea) 02/13/2016  . Insomnia 08/10/2015  . Polyneuropathy 07/10/2015  . GERD (gastroesophageal reflux disease) 07/10/2015  . Acute kidney injury superimposed on chronic kidney disease (HCC) 07/07/2015  . Gait disturbance 07/07/2015  . Morbid (severe) obesity due to excess calories (HCC) complicated by OSA 07/07/2015  . Left hip pain 07/06/2015  . Chronic pain   . Right sided weakness   . Headache(784.0) 04/14/2013  . Encephalopathy acute 04/11/2013  . Type 2 diabetes mellitus with stage 3 chronic kidney disease, without long-term current use of insulin (HCC) 04/11/2013  . Dyslipidemia 04/11/2013  . Chest pain 02/15/2013  . Cerebral infarction (HCC) 01/14/2013  . Essential hypertension 01/14/2013  . Anemia 01/14/2013   Past Medical History:  Diagnosis Date  . Acute stomach ulcer 08/15/2017  . AKI (acute kidney injury) ([facility])    Acute kidney injury on CKD stage II-III/notes 08/17/2017  . Anemia   . Anemia   . Arthritis    "knees" (08/17/2017)  . Chronic pain   . CKD (chronic kidney disease)    Stage 3  . Depression   . Fibromyalgia    hospitilized 12/16 due to inability to walk  . Gastric ulcer   . History of gastritis 2019  . Hyperlipemia   . Hypertension   . Insomnia   . Neuropathy   . Right sided weakness   . Sleep apnea    "CPAPs didn't work for me; that's why they did OR" (08/17/2017)  . Stroke [facility]) 2014?   no residual weakness   . Type II diabetes mellitus  ([facility])     does not check CBG's (08/17/2017)  . Vitamin deficiency    Vit D    Family History  Problem Relation Age of Onset  . Breast cancer Mother   . Leukemia Brother   . Breast cancer Maternal Grandmother     Past Surgical History:  Procedure Laterality Date  . ESOPHAGOGASTRODUODENOSCOPY (EGD) WITH PROPOFOL N/A 08/16/2017   Procedure: ESOPHAGOGASTRODUODENOSCOPY (EGD) WITH PROPOFOL;  Surgeon: Benancio Deeds, MD;  [facility];  Service: Gastroenterology;  Laterality: N/A;  . KNEE ARTHROPLASTY  1997   "stuck rods in it"  . KNEE ARTHROSCOPY Right 1992, 1995,  . OOPHORECTOMY Right 1993?  . TRACHEOSTOMY TUBE PLACEMENT N/A 02/13/2016   Procedure: TRACHEOSTOMY;  Surgeon: Christia Reading, MD;  Location: [facility] OR;  Service: ENT;  Laterality: N/A;  . TUBAL LIGATION     Social History   Occupational History  . Occupation: [employer]SI  Tobacco Use  . Smoking status: Never Smoker  . Smokeless tobacco: Never Used  Substance and Sexual Activity  . Alcohol use: No    Alcohol/week: 0.0 oz  . Drug use: No  . Sexual activity: Never

## 2018-01-06 ENCOUNTER — Ambulatory Visit (INDEPENDENT_AMBULATORY_CARE_PROVIDER_SITE_OTHER): Payer: Medicaid Other | Admitting: Neurology

## 2018-01-06 ENCOUNTER — Encounter: Payer: Self-pay | Admitting: Gastroenterology

## 2018-01-06 ENCOUNTER — Ambulatory Visit: Payer: Medicaid Other | Admitting: Gastroenterology

## 2018-01-06 VITALS — BP 100/60 | HR 104 | Ht 66.0 in | Wt 294.0 lb

## 2018-01-06 DIAGNOSIS — G4733 Obstructive sleep apnea (adult) (pediatric): Secondary | ICD-10-CM

## 2018-01-06 DIAGNOSIS — R0683 Snoring: Secondary | ICD-10-CM

## 2018-01-06 DIAGNOSIS — Z86010 Personal history of colonic polyps: Secondary | ICD-10-CM | POA: Diagnosis not present

## 2018-01-06 DIAGNOSIS — G4730 Sleep apnea, unspecified: Secondary | ICD-10-CM

## 2018-01-06 DIAGNOSIS — Z789 Other specified health status: Secondary | ICD-10-CM

## 2018-01-06 DIAGNOSIS — Z8711 Personal history of peptic ulcer disease: Secondary | ICD-10-CM | POA: Diagnosis not present

## 2018-01-06 DIAGNOSIS — K529 Noninfective gastroenteritis and colitis, unspecified: Secondary | ICD-10-CM | POA: Diagnosis not present

## 2018-01-06 DIAGNOSIS — F39 Unspecified mood [affective] disorder: Secondary | ICD-10-CM

## 2018-01-06 DIAGNOSIS — R6881 Early satiety: Secondary | ICD-10-CM | POA: Diagnosis not present

## 2018-01-06 DIAGNOSIS — R112 Nausea with vomiting, unspecified: Secondary | ICD-10-CM | POA: Diagnosis not present

## 2018-01-06 DIAGNOSIS — R351 Nocturia: Secondary | ICD-10-CM

## 2018-01-06 DIAGNOSIS — R519 Headache, unspecified: Secondary | ICD-10-CM

## 2018-01-06 DIAGNOSIS — R109 Unspecified abdominal pain: Secondary | ICD-10-CM | POA: Diagnosis not present

## 2018-01-06 DIAGNOSIS — R51 Headache: Secondary | ICD-10-CM

## 2018-01-06 DIAGNOSIS — Z820 Family history of epilepsy and other diseases of the nervous system: Secondary | ICD-10-CM

## 2018-01-06 DIAGNOSIS — G4700 Insomnia, unspecified: Secondary | ICD-10-CM

## 2018-01-06 DIAGNOSIS — G4720 Circadian rhythm sleep disorder, unspecified type: Secondary | ICD-10-CM

## 2018-01-06 DIAGNOSIS — Z6841 Body Mass Index (BMI) 40.0 and over, adult: Secondary | ICD-10-CM

## 2018-01-06 MED ORDER — LOPERAMIDE HCL 2 MG PO TABS: ORAL_TABLET | ORAL | 0 refills | Status: DC

## 2018-01-06 MED ORDER — METOCLOPRAMIDE HCL 5 MG PO TABS: 5.0000 mg | ORAL_TABLET | Freq: Three times a day (TID) | ORAL | 0 refills | Status: DC | PRN

## 2018-01-06 MED ORDER — SUPREP BOWEL PREP KIT 17.5-3.13-1.6 GM/177ML PO SOLN: ORAL | 0 refills | Status: DC

## 2018-01-06 MED ORDER — ONDANSETRON 8 MG PO TBDP: 8.0000 mg | ORAL_TABLET | Freq: Three times a day (TID) | ORAL | 1 refills | Status: DC | PRN

## 2018-01-06 NOTE — Progress Notes (Signed)
HPI :  57 y/o female with a history of HTN, CKD3, TIA, fibromyalgia, morbid obesity, severe OSA, here for a follow up visit for abdominal pain and bowel habit changes.   She has been having ongoing symptoms of abdominal pain, nausea, vomiting, loose stools. She was unfortunately mid to the hospital February 2019 with worsening epigastric pain, nausea/vomiting and associated acute kidney injury. She had an EGD doneat that time showing small clean-based gastric ulcer and a few nonbleeding duodenal ulcers. She has been taking Protonix 40 mg twice daily since that time. During that hospitalization she had a CT scan showing hepatic steatosis without any other focal pathology to cause her symptoms. She had an ultrasound of her right upper quadrant which showed fatty liver but no evidence of gallstones. Gastric emptying study was normal. biopsies  on her EGD were negative for H. Pylori. Her last colonoscopy was in August 2013 by Dr. Elnoria Howard, she had a few small adenomas removed and is due for a surveillance colonoscopy.  She states she continues to feel poorly. She continues to have early satiety, with chronic nausea, and vomiting. Most meals she will have some symptoms that bother her. She does have a decreased appetite. She thinks liquids are tolerated just is probably a solid foods. She has been losing some weight due to poor appetite. She states she has gone from 346-294 pounds.    She also has some abdominal pain in the left mid abdomen to left lower quadrant. She states this can be worse with eating at times, its been ongoing since February when she had her CT scan. She does feel like hyoscyamine has helped the pain. She takes this periodically. Her stool frequency can range up to 5-6 loose stools per day. Since she has not been eating as well as usual her stool frequency has decreased. She denies any blood in her stools. She does have some questionable relief of her pain with a bowel movement. She denies any  alcohol use at all. She denies any NSAIDs that she is using. She has been using Zofran as needed which helps her nausea, she is not taking Phenergan. She has had some lightheadedness which is bothering her. She is currently taking Cymbalta for chronic pain / depression.   EGD 08/16/2017 - 3-80mm clean based gastric ulcer, gastritis, a few nonbleeding duodenal ulcers, biopsies negative for H pylori Colonoscopy 2013 - Dr. Elnoria Howard, few small adenomas   Past Medical History:  Diagnosis Date  . Acute stomach ulcer 08/15/2017  . AKI (acute kidney injury) (HCC)    Acute kidney injury on CKD stage II-III/notes 08/17/2017  . Anemia   . Anemia   . Arthritis    "knees" (08/17/2017)  . Chronic pain   . CKD (chronic kidney disease)    Stage 3  . Depression   . Fibromyalgia    hospitilized 12/16 due to inability to walk  . Gastric ulcer   . History of gastritis 2019  . Hyperlipemia   . Hypertension   . Insomnia   . Neuropathy   . Right sided weakness   . Sleep apnea    "CPAPs didn't work for me; that's why they did OR" (08/17/2017)  . Stroke [facility]) 2014?   no residual weakness   . Type II diabetes mellitus (HCC)     does not check CBG's (08/17/2017)  . Vitamin deficiency    Vit D     Past Surgical History:  Procedure Laterality Date  . ESOPHAGOGASTRODUODENOSCOPY (EGD) WITH  PROPOFOL N/A 08/16/2017   Procedure: ESOPHAGOGASTRODUODENOSCOPY (EGD) WITH PROPOFOL;  Surgeon: Benancio DeedsArmbruster, Steven P, MD;  [facility];  Service: Gastroenterology;  Laterality: N/A;  . KNEE ARTHROPLASTY  1997   "stuck rods in it"  . KNEE ARTHROSCOPY Right 1992, 1995,  . OOPHORECTOMY Right 1993?  . TRACHEOSTOMY TUBE PLACEMENT N/A 02/13/2016   Procedure: TRACHEOSTOMY;  Surgeon: Christia Readingwight Bates, MD;  [facility];  Service: ENT;  Laterality: N/A;  . TUBAL LIGATION     Family History  Problem Relation Age of Onset  . Breast cancer Mother   . Leukemia Brother   . Breast cancer Maternal Grandmother    Social History    Tobacco Use  . Smoking status: Never Smoker  . Smokeless tobacco: Never Used  Substance Use Topics  . Alcohol use: No    Alcohol/week: 0.0 oz  . Drug use: No   Current Outpatient Medications  Medication Sig Dispense Refill  . amLODipine (NORVASC) 10 MG tablet Take 10 mg by mouth daily.  3  . atenolol (TENORMIN) 50 MG tablet Take 50 mg by mouth daily.  3  . DULoxetine (CYMBALTA) 60 MG capsule Take 60 mg by mouth daily.     Marland Kitchen. gabapentin (NEURONTIN) 300 MG capsule Take 900 mg by mouth at bedtime.  1  . hydrOXYzine (ATARAX/VISTARIL) 25 MG tablet Take 25 mg by mouth at bedtime.  0  . hyoscyamine (LEVSIN SL) 0.125 MG SL tablet TAKE 0.125 mg TABLET BY MOUTH FOUR TIMES DAILY FOR IBS  2  . montelukast (SINGULAIR) 10 MG tablet Take 10 mg by mouth at bedtime.   1  . Omega-3 Fatty Acids (FISH OIL) 1000 MG CAPS Take 1,000 mg by mouth daily.     . pantoprazole (PROTONIX) 40 MG tablet Take 1 tablet (40 mg total) by mouth 2 (two) times daily. 60 tablet 0  . promethazine (PHENERGAN) 12.5 MG tablet Take 1 tablet (12.5 mg total) by mouth every 6 (six) hours. 30 tablet 0  . sitaGLIPtin (JANUVIA) 25 MG tablet Take 25 mg by mouth daily.    . traZODone (DESYREL) 50 MG tablet Take 50 mg by mouth at bedtime.  0  . Vitamin D, Ergocalciferol, (DRISDOL) 50000 units CAPS capsule Take 50,000 Units by mouth every 7 (seven) days. On Monday  1   No current facility-administered medications for this visit.    Allergies  Allergen Reactions  . Buprenorphine Hcl Hives  . Morphine And Related Hives and Dermatitis     Review of Systems: All systems reviewed and negative except where noted in HPI.    Lab Results  Component Value Date   WBC 9.6 12/08/2017   HGB 11.9 (L) 12/08/2017   HCT 36.8 12/08/2017   MCV 79.3 12/08/2017   PLT 450 (H) 12/08/2017    Lab Results  Component Value Date   CREATININE 1.10 (H) 12/08/2017   BUN 11 12/08/2017   NA 142 12/08/2017   K 4.4 12/08/2017   CL 109 12/08/2017   CO2  22 12/08/2017    Lab Results  Component Value Date   ALT 16 10/19/2017   AST 15 10/19/2017   ALKPHOS 78 10/19/2017   BILITOT 0.4 10/19/2017     Physical Exam: BP 100/60 (BP Location: Left Wrist, Patient Position: Sitting, Cuff Size: Normal)   Pulse (!) 104   Ht 5\' 6"  (1.676 m)   Wt 294 lb (133.4 kg)   BMI 47.45 kg/m  repeat HR 80s Constitutional: Pleasant, female in no acute distress.  HEENT: Normocephalic and atraumatic. Conjunctivae are normal. No scleral icterus. Neck supple.  Cardiovascular: Normal rate, regular rhythm.  Pulmonary/chest: Effort normal and breath sounds normal. No wheezing, rales or rhonchi. Abdominal: Soft, nondistended, left mid abdomen TTP, There are no masses palpable. No hepatomegaly. Extremities: no edema Lymphadenopathy: No cervical adenopathy noted. Neurological: Alert and oriented to person place and time. Skin: Skin is warm and dry. No rashes noted. Psychiatric: Normal mood and affect. Behavior is normal.   ASSESSMENT AND PLAN: 57 year old female with compensated medical history as outlined above, here for reassessment of the following issues:  Nausea and vomiting / early satiety / history of peptic ulcer / abdominal pain / chronic diarrhea - she has had an extensive evaluation. Prior EGD did show gastric and duodenal ulcers and now she is on high-dose PPI but this has not appeared to help her symptoms. Her prior gastric emptying study was negative. She's had imaging which has not shown any significant pathology. She continues to have loose stools which is chronic. I discussed options with her. She is due for a colonoscopy for surveillance purposes regarding history of adenomatous polyps itself, recommend doing that now to also rule out microscopic colitis given her ongoing loose stools. CT scan argues against IBD but will ensure no evidence of that. At the same time we'll also repeat her upper endoscopy to see if her ulcers have persisted or if they  have responded to treatment with PPI given her symptoms are unchanged. I discussed EGD and colonoscopy with her and following discussion risks and benefits she wanted to proceed. If her ulcers persist will send gastrin level. Will give her Zofran to take every 8 hours scheduled in hopes of decreasing her nausea. Despite her gastric emptying study being normal I offered her a trial of Reglan to see if this will help with her early satiety and her oral intake. I discussed risks with her, will start low-dose 5 mg 3 times a day. Her abdominal pain does seem to improve with Levsin. I recommend she take this scheduled every 6-8 hours. Pending her course if endoscopic evaluation is negative and upper tract symptoms persist would consider adding Remeron or buspirone to treat dyspepsia.   She agreed with the plan, further recommendations pending the results.  Ileene Patrick, MD [facility]

## 2018-01-06 NOTE — Patient Instructions (Addendum)
If you are age 56 or younger, your body mass index should be between 19-25. Your Body mass index is 47.45 kg/m. If this is out of the aformentioned range listed, please consider follow up with your Primary Care Provider.   You have been scheduled for an endoscopy and colonoscopy. Please follow the written instructions given to you at your visit today. Please pick up your prep supplies at the pharmacy within the next 1-3 days. If you use inhalers (even only as needed), please bring them with you on the day of your procedure. Your physician has requested that you go to www.startemmi.com and enter the access code given to you at your visit today. This web site gives a general overview about your procedure. However, you should still follow specific instructions given to you by our office regarding your preparation for the procedure.  We have sent the following medications to your pharmacy for you to pick up at your convenience: Zofran 8mg  ODT: Take every 8 hours  Reglan 5mg : Take every 8 hours  Please purchase the following medications over the counter and take as directed: Imodium- take as needed  Please take your Levsin (hyoscamine) on a schedule of every 6 to 8 hours.  Thank you for entrusting me with your care and for choosing [facility], Dr. Ileene PatrickSteven Armbruster

## 2018-01-09 ENCOUNTER — Encounter: Payer: Self-pay | Admitting: Family Medicine

## 2018-01-09 ENCOUNTER — Ambulatory Visit: Payer: Medicaid Other | Admitting: Family Medicine

## 2018-01-09 ENCOUNTER — Other Ambulatory Visit: Payer: Self-pay

## 2018-01-09 VITALS — BP 104/66 | HR 89 | Temp 98.3°F | Ht 66.0 in | Wt 295.2 lb

## 2018-01-09 DIAGNOSIS — R1084 Generalized abdominal pain: Secondary | ICD-10-CM

## 2018-01-09 DIAGNOSIS — R42 Dizziness and giddiness: Secondary | ICD-10-CM | POA: Diagnosis not present

## 2018-01-09 DIAGNOSIS — N183 Chronic kidney disease, stage 3 (moderate): Secondary | ICD-10-CM | POA: Diagnosis not present

## 2018-01-09 DIAGNOSIS — E1122 Type 2 diabetes mellitus with diabetic chronic kidney disease: Secondary | ICD-10-CM

## 2018-01-09 LAB — GLUCOSE, POCT (MANUAL RESULT ENTRY): POC Glucose: 137 mg/dl — AB (ref 70–99)

## 2018-01-09 LAB — POCT GLYCOSYLATED HEMOGLOBIN (HGB A1C): HbA1c, POC (controlled diabetic range): 5.8 % (ref 0.0–7.0)

## 2018-01-09 MED ORDER — AMLODIPINE BESYLATE 5 MG PO TABS: 5.0000 mg | ORAL_TABLET | Freq: Every day | ORAL | 0 refills | Status: DC

## 2018-01-09 NOTE — Progress Notes (Signed)
Subjective:   Patient ID: Yesenia [name]    DOB: 1960/11/23, 57 y.o. female   MRN: [id number]  Yesenia [name] is a 57 y.o. female with a history of HTN, OSA, IBS, DM2, CKD3, obesity, anxiety, MDD s/p ECT here for   Dizziness/lightheadedness Patient endorses chronic dizziness and lightheadedness that comes and goes for the past couple of years, states it has recently worsened. States it used to feel like the room is spinning but now states it feels like she will pass out and gets auseated when she stands for longer periods of time and particularly notices symptoms during positional changes.  Has seen neurology in the past for OSA but has never been seen for dizziness.  Also states she feels like she is having a hard time concentrating.  She has not tried any medications for this.  Denies palpitations but does endorse some periods of double vision and sees flashers that come and go.  She denies any falls or head trauma, headache, speaking problems, ear pain or fullness but does endorse some hearing loss in her left ear.  States she has not experienced any new weakness but does have some chronic weakness in her right leg.  She does have a history of diabetes for which she takes Januvia.  Her last A1c was 6.2 on May 23.  She does not check her blood sugars at home.  She has been eating less as she has experienced abdominal pain and nausea (see below) and typically eats about twice per day.  She reports compliance to taking Januvia every day.  Brain MRI done 10/2017 with no acute intracranial abnormalities with moderate white matter disease and possible chronic small vessel ischemic disease (was seen in the ED for tremors).  The last time she ate or drank anything was 10 AM this morning (maltomeal).  She lives alone.  Abdominal Pain Patient reports persistent abdominal pain, nausea, vomiting, loose stools, and early satiety that has been off and on for years however reports worsening inconsistent pain since Friday.   Since she was hospitalized in February, she has been taking Protonix twice daily.  EGD at that time showed gastric and duodenal ulcers with a negative gastric emptying study.  She has seen GI for this pain as recently as 6/28 who recommended surveillance colonoscopy to rule out microscopic colitis and a repeat upper endoscopy.  These are scheduled for August.  They started her on Zofran and Reglan to which she reports compliance as well as continued levsin with slight relief.  They note to consider Remeron or buspirone for dyspepsia if imaging is negative.  If ulcers persist on imaging they plan to send a gastrin level.  She notes her pain worsens with eating and slightly improves with bowel movements.  She has 4-5 loose bowel movements per day.  She denies prior abdominal surgeries.  She states she vomits almost every time she eats and has had nausea persistently.  She denies blood in her vomit or blood in her stool although does often have dark stools but attributes this to iron supplementation.  She denies fevers.  She endorses some "tingling"with urination that started last week but denies burning.  She reports early satiety and weight loss the past few months.  She denies vaginal bleeding, abnormal discharge or pelvic pain.  She is postmenopausal. 24 hr recall: woke up at 10am, 1pm (cream of wheat, maltomeal, drank apple juice (made stomach pain worse), 4pm (rice with butter, water), went to bed at 3am. Typical day  for her.  Review of Systems:  Per HPI.  PMFSH, medications and smoking status reviewed.  Objective:   BP 104/66   Pulse 89   Temp 98.3 F (36.8 C) (Oral)   Ht 5\' 6"  (1.676 m)   Wt 295 lb 3.2 oz (133.9 kg)   SpO2 97%   BMI 47.65 kg/m  Vitals and nursing note reviewed.  General: Morbidly obese female in mild distress with non-toxic appearance HEENT: normocephalic, atraumatic, moist mucous membranes CV: regular rate and rhythm without murmurs, rubs, or gallops, no lower extremity  edema Lungs: clear to auscultation bilaterally with normal work of breathing Abdomen: soft, obese abdomen, no masses or organomegaly palpable though difficult due to body habitus.  Tender to palpation diffusely though most prominent in left upper quadrant and supapubically.  Skin: warm, dry, no rashes or lesions Extremities: warm and well perfused, normal tone MSK: ROM grossly intact, strength 5/5 to U/LE bilaterally with poorer effort in lower exxtremities, normal gait.  No edema.  Neuro: Alert and oriented, speech normal. Optic field normal. PERRL, Extraocular movements intact. Hearing grossly intact bilaterally.  Tongue protrudes normally with no deviation.  Shoulder shrug, smile symmetric. Finger to nose normal.  Assessment & Plan:   Dizziness Chronic in nature with recent worsening.  A1c today 5.8, lower from previous at 6.2 on 5/23.  Nonfasting CBG in clinic 137.  Reports daily compliance to Januvia although has been eating less due to abdominal pain, does not check her sugars at home.  Likely she is having lower blood sugars contributing to dizziness and nausea.  Will stop Januvia at this time.  Advised patient to eat small frequent meals as able to tolerate.  BP also low normal today 104/66 so may have some orthostatic component as well.  Did not check orthostatic vitals today as patient visibly uncomfortable during interview and exam.  Will decrease Norvasc to 5 mg and continue atenolol.  Neurologic exam reassuring and without deficits, do not feel the need for repeat imaging at this time.  Will have patient follow-up in a few weeks to assess continuation of symptoms.  Abdominal pain Chronic in nature with recent worsening.  Previously with extensive work-up with recent GI visit and plans for follow-up.  With onset of "tingling" during urination and new suprapubic pain, may have UTI in addition to her chronic abdominal pain.  Attempted to obtain a urine sample but patient was unable to  collect.  Will send collection cup with patient to obtain sample and bring to clinic as available.  Will defer empiric antibiotic treatment until able to collect urine sample given chronic abdominal pain and risk of worsening GI symptoms and delaying ability to assess etiology.  Discontinuing Januvia for likely lower blood sugars may also aid in resolving her nausea. Advised patient to call GI and inform of worsened symptoms as they may want to schedule her colonoscopy and EGD for sooner than August.    Orders Placed This Encounter  Procedures  . Glucose (CBG)  . HgB A1c  . POCT urinalysis dipstick    Standing Status:   Future    Standing Expiration Date:   02/09/2018   Meds ordered this encounter  Medications  . amLODipine (NORVASC) 5 MG tablet    Sig: Take 1 tablet (5 mg total) by mouth daily.    Dispense:  30 tablet    Refill:  0   Precepted with Dr. Jennette KettleNeal.  Ellwood DenseAlison Rumball, DO PGY-2, [facility] 01/10/2018 11:32 AM

## 2018-01-09 NOTE — Patient Instructions (Addendum)
It was great to see you!  Our plans for today:   For your abdominal pain, - We will collect a urine sample to check for infection, be sure to bring this sample or come to the clinic for collection. - Call GI, they may want to reschedule your colonoscopy and endoscopy for sooner.  For your dizziness, - We are decreasing your amlodipine dose because your blood pressure is low and may be contributing to your dizziness. - We are also stopping your Januvia as your A1c is lower and you likely have been having low blood sugars which can also cause dizziness.  I would like you to follow up soon, within a couple weeks to a month, to see how these changes are working for you. You can make this appointment with your primary doctor.  Take care and seek immediate care sooner if you develop any concerns.   Dr. Mollie Germanyumball [facility]

## 2018-01-10 ENCOUNTER — Encounter: Payer: Self-pay | Admitting: Family Medicine

## 2018-01-10 DIAGNOSIS — R42 Dizziness and giddiness: Secondary | ICD-10-CM | POA: Insufficient documentation

## 2018-01-10 NOTE — Assessment & Plan Note (Addendum)
Chronic in nature with recent worsening.  Previously with extensive work-up with recent GI visit and plans for follow-up.  With onset of "tingling" during urination and new suprapubic pain, may have UTI in addition to her chronic abdominal pain.  Attempted to obtain a urine sample but patient was unable to collect.  Will send collection cup with patient to obtain sample and bring to clinic as available.  Will defer empiric antibiotic treatment until able to collect urine sample given chronic abdominal pain and risk of worsening GI symptoms and delaying ability to assess etiology.  Discontinuing Januvia for likely lower blood sugars may also aid in resolving her nausea. Advised patient to call GI and inform of worsened symptoms as they may want to schedule her colonoscopy and EGD for sooner than August.

## 2018-01-10 NOTE — Assessment & Plan Note (Addendum)
Chronic in nature with recent worsening.  A1c today 5.8, lower from previous at 6.2 on 5/23.  Nonfasting CBG in clinic 137.  Reports daily compliance to Januvia although has been eating less due to abdominal pain, does not check her sugars at home.  Likely she is having lower blood sugars contributing to dizziness and nausea.  Will stop Januvia at this time.  Advised patient to eat small frequent meals as able to tolerate.  BP also low normal today 104/66 so may have some orthostatic component as well.  Did not check orthostatic vitals today as patient visibly uncomfortable during interview and exam.  Will decrease Norvasc to 5 mg and continue atenolol.  Neurologic exam reassuring and without deficits, do not feel the need for repeat imaging at this time.  Will have patient follow-up in a few weeks to assess continuation of symptoms.

## 2018-01-11 ENCOUNTER — Telehealth: Payer: Self-pay | Admitting: Gastroenterology

## 2018-01-11 NOTE — Telephone Encounter (Signed)
Yesenia FanningJulie she should be taking Zofran scheduled every 6-8 hours as well as trial of low dose Reglan. If she is tolerating Reglan at 5mg  dosing, she can try increasing to 10mg  three times daily. We are awaiting repeat EGD and colonoscopy, she should be on a wait list to be done sooner than August if any openings arise. Alternatively, if she wishes to do just the EGD first in light of her symptoms we can add her on to 730 AM start in the [facility] in the next 1-2 weeks.

## 2018-01-11 NOTE — Telephone Encounter (Signed)
Spoke to patient, she has been taking all her medication as directed and her stomach is not any better. Please advise.

## 2018-01-13 NOTE — Telephone Encounter (Signed)
Spoke to patient and she will increase the Reglan to 10 mg TID, continue to take the zofran. I have also split up the procedure, worked patient into a 0730 slot on 7/23 and kept the colonoscopy on 8/27 at 0300. Verbally instructed patient on prep, she is no longer on insulin. I also let her know that I would send out new instructions.

## 2018-01-17 ENCOUNTER — Other Ambulatory Visit: Payer: Self-pay

## 2018-01-24 ENCOUNTER — Telehealth: Payer: Self-pay

## 2018-01-24 ENCOUNTER — Other Ambulatory Visit: Payer: Self-pay

## 2018-01-24 NOTE — Progress Notes (Signed)
Patient referred by Lindaann PascalScott Long, seen by me on 12/20/17 for re-eval of OSA (had trach with reversal in the interim), diagnostic PSG on 01/06/18.   Please call and notify the patient that the recent sleep study did not show any significant obstructive sleep apnea with the exception of mild snoring and borderline REM related OSA. For this, CPAP therapy is not warranted. Weight loss is recommended. Please inform patient that she can FU with PCP at this point.   Thanks,  Huston FoleySaima Anastasios Melander, MD, PhD [facility]

## 2018-01-24 NOTE — Telephone Encounter (Signed)
-----   Message from Huston FoleySaima Athar, MD sent at 01/24/2018 12:20 PM EDT ----- Patient referred by Lindaann PascalScott Long, seen by me on 12/20/17 for re-eval of OSA (had trach with reversal in the interim), diagnostic PSG on 01/06/18.   Please call and notify the patient that the recent sleep study did not show any significant obstructive sleep apnea with the exception of mild snoring and borderline REM related OSA. For this, CPAP therapy is not warranted. Weight loss is recommended. Please inform patient that she can FU with PCP at this point.   Thanks,  Huston FoleySaima Athar, MD, PhD [facility]

## 2018-01-24 NOTE — Telephone Encounter (Signed)
I called pt. I advised her that the sleep study did not show any significant osa with the exception of mild snoring and borderline REM related OSA and for this, cpap therapy is not warranted. Weight loss is recommended. I advised pt that she can follow up with her PCP. I will send a copy of this study to MarriottScott Long. Pt verbalized understanding of results. Pt had no questions at this time but was encouraged to call back if questions arise.

## 2018-01-24 NOTE — Procedures (Signed)
PATIENT'S NAME:  Yesenia [name], Yesenia [name] DOB:      06-Dec-1960      MR#:    [id number]     DATE OF RECORDING: 01/06/2018 REFERRING M.D.:  Marva Panda, NP Study Performed:   Baseline Polysomnogram HISTORY: 57 year old woman with a complex history of fibromyalgia, hypertension, COPD, chronic back pain, diabetes, vitamin D deficiency, anemia, PUD, recurrent headaches, mood disorder, status post trach and subsequent reversal, who presents for reevaluation of her prior diagnosis of obstructive sleep apnea. The patient endorsed the Epworth Sleepiness Scale at 4 points. The patient's weight 292 pounds with a height of 66 (inches), resulting in a BMI of 46.8 kg/m2. The patient's neck circumference measured 18 inches.  CURRENT MEDICATIONS: Norvasc, Tenoformin, Cymbalta, Neurontin, hydroxyzine, Levsin SL, Singulair, Pamelor, Omega 3, Protonox, Phenergan, Januvia, Desyrel, Vitamin D,   PROCEDURE:  This is a multichannel digital polysomnogram utilizing the Somnostar 11.2 system.  Electrodes and sensors were applied and monitored per AASM Specifications.   EEG, EOG, Chin and Limb EMG, were sampled at 200 Hz.  ECG, Snore and Nasal Pressure, Thermal Airflow, Respiratory Effort, CPAP Flow and Pressure, Oximetry was sampled at 50 Hz. Digital video and audio were recorded.      BASELINE STUDY  Lights Out was at 22:38 and Lights On at 05:00.  Total recording time (TRT) was 382 minutes, with a total sleep time (TST) of  234 minutes.   The patient's sleep latency was 76 minutes which is delayed. REM latency was 135.5 minutes, which is delayed. The sleep efficiency was 61.3%, which is reduced.     SLEEP ARCHITECTURE: WASO (Wake after sleep onset) was 82.5 minutes with moderate sleep fragmentation noted.  There were 16.5 minutes in Stage N1, 111 minutes Stage N2, 73.5 minutes Stage N3 and 33 minutes in Stage REM.  The percentage of Stage N1 was 7.1%, Stage N2 was 47.4%, which is normal, Stage N3 was 31.4%, which is increased, and  Stage R (REM sleep) was 14.1%, which is reduced. The arousals were noted as: 19 were spontaneous, 0 were associated with PLMs, 1 were associated withrespiratory events.  RESPIRATORY ANALYSIS:  There were a total of 10 respiratory events:  0 obstructive apneas, 0 central apneas and 0 mixed apneas with a total of 0 apneas and an apnea index (AI) of 0 /hour. There were 10 hypopneas with a hypopnea index of 2.6 /hour. The patient also had 0 respiratory event related arousals (RERAs).      The total APNEA/HYPOPNEA INDEX (AHI) was 2.6/hour and the total RESPIRATORY DISTURBANCE INDEX was 0. 2.6 /hour.  3 events occurred in REM sleep and 14 events in NREM. The REM AHI was 5.5 /hour, versus a non-REM AHI of 2.1. The patient spent 143.5 minutes of total sleep time in the supine position and 91 minutes in non-supine.. The supine AHI was 1.7 versus a non-supine AHI of 4.0.  OXYGEN SATURATION & C02:  The Wake baseline 02 saturation was 92%, with the lowest being 85%. Time spent below 89% saturation equaled 9 minutes. There were some difficulties with the oxygen sensor and several faulty low readings.   PERIODIC LIMB MOVEMENTS:  The patient had a total of 0 Periodic Limb Movements.  The Periodic Limb Movement (PLM) index was 0 and the PLM Arousal index was 0/hour.  Audio and video analysis did not show any abnormal or unusual movements, behaviors, phonations or vocalizations. The patient took one bathroom breaks. Mild snoring was noted. The EKG was in keeping with normal sinus rhythm (  NSR).   Post-study, the patient indicated that sleep was the same as usual.   IMPRESSION:  1. Primary Snoring 2. Dysfunctions associated with sleep stages or arousal from sleep  RECOMMENDATIONS:  1. This study does not demonstrate any significant obstructive or central sleep disordered breathing with the exception of mild snoring and borderline REM related OSA. For this, CPAP therapy is not warranted. Weight loss is recommended.  This study does not support an intrinsic sleep disorder as a cause of the patient's symptoms. Other causes, including circadian rhythm disturbances, an underlying mood disorder, medication effect and/or an underlying medical problem cannot be ruled out. 2. This study shows sleep fragmentation and abnormal sleep stage percentages; these are nonspecific findings and per se do not signify an intrinsic sleep disorder or a cause for the patient's sleep-related symptoms. Causes include (but are not limited to) the first night effect of the sleep study, circadian rhythm disturbances, medication effect or an underlying mood disorder or medical problem.  3. The patient should be cautioned not to drive, work at heights, or operate dangerous or heavy equipment when tired or sleepy. Review and reiteration of good sleep hygiene measures should be pursued with any patient. 4. The patient will be advised to follow up with the referring provider, who will be notified of the test results.  I certify that I have reviewed the entire raw data recording prior to the issuance of this report in accordance with the Standards of Accreditation of the American Academy of Sleep Medicine (AASM)  Huston FoleySaima Rodriguez Aguinaldo, MD, PhD Diplomat, American Board of Neurology and Sleep Medicine (Neurology and Sleep Medicine)

## 2018-01-25 ENCOUNTER — Ambulatory Visit (INDEPENDENT_AMBULATORY_CARE_PROVIDER_SITE_OTHER): Payer: Medicaid Other | Admitting: Orthopedic Surgery

## 2018-01-31 ENCOUNTER — Other Ambulatory Visit: Payer: Self-pay

## 2018-01-31 ENCOUNTER — Encounter: Payer: Self-pay | Admitting: Gastroenterology

## 2018-01-31 ENCOUNTER — Ambulatory Visit (AMBULATORY_SURGERY_CENTER): Payer: Medicaid Other | Admitting: Gastroenterology

## 2018-01-31 VITALS — BP 87/56 | HR 80 | Temp 98.4°F | Resp 16 | Ht 66.0 in | Wt 295.0 lb

## 2018-01-31 DIAGNOSIS — R112 Nausea with vomiting, unspecified: Secondary | ICD-10-CM

## 2018-01-31 DIAGNOSIS — R1013 Epigastric pain: Secondary | ICD-10-CM | POA: Diagnosis not present

## 2018-01-31 DIAGNOSIS — Z8719 Personal history of other diseases of the digestive system: Secondary | ICD-10-CM | POA: Diagnosis not present

## 2018-01-31 DIAGNOSIS — Z8711 Personal history of peptic ulcer disease: Secondary | ICD-10-CM

## 2018-01-31 MED ORDER — SODIUM CHLORIDE 0.9 % IV SOLN: 500.0000 mL | Freq: Once | INTRAVENOUS | Status: DC

## 2018-01-31 NOTE — Patient Instructions (Signed)
Handouts:  Hiatal Hernia  YOU HAD AN ENDOSCOPIC PROCEDURE TODAY AT THE  ENDOSCOPY CENTER:   Refer to the procedure report that was given to you for any specific questions about what was found during the examination.  If the procedure report does not answer your questions, please call your gastroenterologist to clarify.  If you requested that your care partner not be given the details of your procedure findings, then the procedure report has been included in a sealed envelope for you to review at your convenience later.  YOU SHOULD EXPECT: Some feelings of bloating in the abdomen. Passage of more gas than usual.  Walking can help get rid of the air that was put into your GI tract during the procedure and reduce the bloating. If you had a lower endoscopy (such as a colonoscopy or flexible sigmoidoscopy) you may notice spotting of blood in your stool or on the toilet paper. If you underwent a bowel prep for your procedure, you may not have a normal bowel movement for a few days.  Please Note:  You might notice some irritation and congestion in your nose or some drainage.  This is from the oxygen used during your procedure.  There is no need for concern and it should clear up in a day or so.  SYMPTOMS TO REPORT IMMEDIATELY:    Following upper endoscopy (EGD)  Vomiting of blood or coffee ground material  New chest pain or pain under the shoulder blades  Painful or persistently difficult swallowing  New shortness of breath  Fever of 100F or higher  Black, tarry-looking stools  For urgent or emergent issues, a gastroenterologist can be reached at any hour by calling [phone].   DIET:  We do recommend a small meal at first, but then you may proceed to your regular diet.  Drink plenty of fluids but you should avoid alcoholic beverages for 24 hours.  ACTIVITY:  You should plan to take it easy for the rest of today and you should NOT DRIVE or use heavy machinery until tomorrow (because of  the sedation medicines used during the test).    FOLLOW UP: Our staff will call the number listed on your records the next business day following your procedure to check on you and address any questions or concerns that you may have regarding the information given to you following your procedure. If we do not reach you, we will leave a message.  However, if you are feeling well and you are not experiencing any problems, there is no need to return our call.  We will assume that you have returned to your regular daily activities without incident.  If any biopsies were taken you will be contacted by phone or by letter within the next 1-3 weeks.  Please call us at [phone] if you have not heard about the biopsies in 3 weeks.    SIGNATURES/CONFIDENTIALITY: You and/or your care partner have signed paperwork which will be entered into your electronic medical record.  These signatures attest to the fact that that the information above on your After Visit Summary has been reviewed and is understood.  Full responsibility of the confidentiality of this discharge information lies with you and/or your care-partner. 

## 2018-01-31 NOTE — Progress Notes (Signed)
Called to room to assist during endoscopic procedure.  Patient ID and intended procedure confirmed with present staff. Received instructions for my participation in the procedure from the performing physician.  

## 2018-01-31 NOTE — Progress Notes (Signed)
Pt's states no medical or surgical changes since previsit or office visit. 

## 2018-01-31 NOTE — Op Note (Signed)
[facility] Patient Name: Yesenia [name] Procedure Date: 01/31/2018 7:34 AM MRN: [id number] Endoscopist: Viviann Spare P. Adela Lank , MD Age: 57 Referring MD:  Date of Birth: 23-May-1961 Gender: Female Account #: [id number] Procedure:                Upper GI endoscopy Indications:              Epigastric abdominal pain, Follow-up of gastric /                            duodenal ulcers, Nausea with vomiting - on Zofran /                            Reglan with only mild improvement, negative gastric                            emptying study Medicines:                Monitored Anesthesia Care Procedure:                Pre-Anesthesia Assessment:                           - Prior to the procedure, a History and Physical                            was performed, and patient medications and                            allergies were reviewed. The patient's tolerance of                            previous anesthesia was also reviewed. The risks                            and benefits of the procedure and the sedation                            options and risks were discussed with the patient.                            All questions were answered, and informed consent                            was obtained. Prior Anticoagulants: The patient has                            taken no previous anticoagulant or antiplatelet                            agents. ASA Grade Assessment: III - A patient with                            severe systemic disease. After reviewing the risks  and benefits, the patient was deemed in                            satisfactory condition to undergo the procedure.                           After obtaining informed consent, the endoscope was                            passed under direct vision. Throughout the                            procedure, the patient's blood pressure, pulse, and                            oxygen saturations were  monitored continuously. The                            Model GIF-HQ190 [id number](SN#[id number]) scope was introduced                            through the mouth, and advanced to the second part                            of duodenum. The upper GI endoscopy was                            accomplished without difficulty. The patient                            tolerated the procedure well. Scope In: Scope Out: Findings:                 Esophagogastric landmarks were identified: the                            Z-line was found at 35 cm, the gastroesophageal                            junction was found at 35 cm and the upper extent of                            the gastric folds was found at 37 cm from the                            incisors.                           A 2 cm hiatal hernia was present.                           The exam of the esophagus was otherwise normal.                           Patchy mildly erythematous  mucosa was found in the                            gastric antrum.                           The exam of the stomach was otherwise normal.                           Biopsies were taken with a cold forceps in the                            gastric body, at the incisura and in the gastric                            antrum for Helicobacter pylori testing.                           The duodenal bulb and second portion of the                            duodenum were normal. Biopsies for histology were                            taken with a cold forceps for evaluation of celiac                            disease. Complications:            No immediate complications. Estimated blood loss:                            Minimal. Estimated Blood Loss:     Estimated blood loss was minimal. Impression:               - Esophagogastric landmarks identified.                           - 2 cm hiatal hernia.                           - Erythematous mucosa in the antrum.                           -  Normal stomach otherwise. Biopsies taken to rule                            out H pylori.                           - Normal duodenal bulb and second portion of the                            duodenum. Biopsied to rule out celiac. Recommendation:           - Patient has a contact number available for  emergencies. The signs and symptoms of potential                            delayed complications were discussed with the                            patient. Return to normal activities tomorrow.                            Written discharge instructions were provided to the                            patient.                           - Resume previous diet.                           - Continue present medications.                           - Await pathology results. If negative will look                            into other options to treat dyspepsia Willaim Rayas. Armbruster, MD 01/31/2018 7:56:31 AM This report has been signed electronically.

## 2018-01-31 NOTE — Progress Notes (Signed)
Spontaneous respirations throughout. VSS. Resting comfortably. To PACU on room air. Report to  RN. 

## 2018-02-01 ENCOUNTER — Other Ambulatory Visit: Payer: Self-pay | Admitting: Family Medicine

## 2018-02-01 ENCOUNTER — Telehealth: Payer: Self-pay

## 2018-02-01 NOTE — Telephone Encounter (Signed)
  Follow up Call-  Call back number 01/31/2018  Post procedure Call Back phone  # [phone]  Permission to leave phone message Yes  Some recent data might be hidden     Patient questions:  Do you have a fever, pain , or abdominal swelling? No. Pain Score  0 *  Have you tolerated food without any problems? Yes.    Have you been able to return to your normal activities? Yes.    Do you have any questions about your discharge instructions: Diet   No. Medications  No. Follow up visit  No.  Do you have questions or concerns about your Care? No.  Actions: * If pain score is 4 or above: No action needed, pain <4.

## 2018-02-02 ENCOUNTER — Telehealth (INDEPENDENT_AMBULATORY_CARE_PROVIDER_SITE_OTHER): Payer: Self-pay | Admitting: Orthopedic Surgery

## 2018-02-02 NOTE — Telephone Encounter (Signed)
[facility] that she needs another referral to get her MRI done.  She was having difficulty getting to her appointments.  CB#[phone].  Thank you.

## 2018-02-02 NOTE — Telephone Encounter (Signed)
IC s/w patient and advised per her chart she was scheduled for scan on 08/20

## 2018-02-04 ENCOUNTER — Inpatient Hospital Stay: Admission: RE | Admit: 2018-02-04 | Payer: Self-pay | Source: Ambulatory Visit

## 2018-02-06 ENCOUNTER — Other Ambulatory Visit: Payer: Self-pay | Admitting: Gastroenterology

## 2018-02-07 ENCOUNTER — Telehealth: Payer: Self-pay | Admitting: Gastroenterology

## 2018-02-07 NOTE — Telephone Encounter (Signed)
Patient wanting to see if results are back from endo on 7.23.19.

## 2018-02-07 NOTE — Telephone Encounter (Signed)
Let patient know that the results have not been reviewed yet, when Dr. Adela LankArmbruster has had a chance to do that, we will contact her.

## 2018-02-09 ENCOUNTER — Ambulatory Visit (INDEPENDENT_AMBULATORY_CARE_PROVIDER_SITE_OTHER): Payer: Medicaid Other | Admitting: Orthopedic Surgery

## 2018-02-10 ENCOUNTER — Other Ambulatory Visit: Payer: Self-pay

## 2018-02-10 ENCOUNTER — Ambulatory Visit: Payer: Self-pay | Admitting: Neurology

## 2018-02-10 MED ORDER — MIRTAZAPINE 15 MG PO TABS: 15.0000 mg | ORAL_TABLET | Freq: Every day | ORAL | 1 refills | Status: DC

## 2018-02-16 ENCOUNTER — Ambulatory Visit (HOSPITAL_COMMUNITY)
Admission: RE | Admit: 2018-02-16 | Discharge: 2018-02-16 | Disposition: A | Discharge status: Home / Self Care | Payer: Medicaid Other | Source: Ambulatory Visit | Attending: Family Medicine | Admitting: Family Medicine

## 2018-02-16 ENCOUNTER — Other Ambulatory Visit: Payer: Self-pay | Admitting: Family Medicine

## 2018-02-16 ENCOUNTER — Ambulatory Visit: Payer: Medicaid Other | Admitting: Family Medicine

## 2018-02-16 ENCOUNTER — Telehealth: Payer: Self-pay

## 2018-02-16 ENCOUNTER — Encounter: Payer: Self-pay | Admitting: Family Medicine

## 2018-02-16 VITALS — BP 101/70 | HR 96 | Temp 98.5°F | Wt 295.6 lb

## 2018-02-16 DIAGNOSIS — N183 Chronic kidney disease, stage 3 unspecified: Secondary | ICD-10-CM

## 2018-02-16 DIAGNOSIS — G4700 Insomnia, unspecified: Secondary | ICD-10-CM | POA: Diagnosis not present

## 2018-02-16 DIAGNOSIS — R0602 Shortness of breath: Secondary | ICD-10-CM | POA: Insufficient documentation

## 2018-02-16 DIAGNOSIS — E1122 Type 2 diabetes mellitus with diabetic chronic kidney disease: Secondary | ICD-10-CM | POA: Diagnosis not present

## 2018-02-16 DIAGNOSIS — R000 Tachycardia, unspecified: Secondary | ICD-10-CM | POA: Insufficient documentation

## 2018-02-16 DIAGNOSIS — R112 Nausea with vomiting, unspecified: Secondary | ICD-10-CM | POA: Diagnosis not present

## 2018-02-16 LAB — GLUCOSE, POCT (MANUAL RESULT ENTRY): POC Glucose: 121 mg/dl — AB (ref 70–99)

## 2018-02-16 MED ORDER — MELATONIN 1 MG PO CAPS: 1.0000 mg | ORAL_CAPSULE | Freq: Every day | ORAL | 0 refills | Status: AC

## 2018-02-16 MED ORDER — HYDROXYZINE HCL 25 MG PO TABS: 25.0000 mg | ORAL_TABLET | Freq: Every day | ORAL | 0 refills | Status: DC

## 2018-02-16 NOTE — Progress Notes (Addendum)
Subjective: Chief Complaint  Patient presents with  . Nausea  . heavy legs     HPI: Yesenia [name] is a 57 y.o. presenting to clinic today to discuss the following:  SOB with heaviness in her legs with associated palpitations and nausea 2 to 3 days of shortness of breath with associated leg "heaviness" and weakness on exertion that improves with rest. She was stopped on Januvia at her last appointment she says. Her last A1c at that time was 5.8. She has associated palpitations during these episodes. She is having associated nausea and had one episode of emesis yesterday but no abdominal pain. She has chronic diarrhea and constipation. No fevers, chills, no chest pain, and some mild burning on urination. She was recently started on Mirtazipine on 02/13/2018. She has stopped taking trazadone and duloxetine. She has not had associated SOB and leg weakness before this recent episode.  Health Maintenance: none     ROS noted in HPI.   Past Medical, Surgical, Social, and Family History Reviewed & Updated per EMR.   Pertinent Historical Findings include:   Social History   Tobacco Use  Smoking Status Never Smoker  Smokeless Tobacco Never Used    Objective: BP 101/70 (BP Location: Left Wrist, Patient Position: Sitting, Cuff Size: Normal)   Pulse 96   Temp 98.5 F (36.9 C) (Oral)   Wt 295 lb 9.6 oz (134.1 kg)   SpO2 98%   BMI 47.71 kg/m  Vitals and nursing notes reviewed  Physical Exam Gen: Alert and Oriented x 3, NAD HEENT: Normocephalic, atraumatic, PERRLA, EOMI CV: RRR, no murmurs, normal S1, S2 split, +2 pulses dorsalis pedis bilaterally Resp: CTAB, no wheezing, rales, or rhonchi, comfortable work of breathing MSK: Moves all four extremities Ext: no clubbing, cyanosis, or edema Neuro: CN II-XII intact, no focal or gross deficits Skin: warm, dry, intact, no rashes Psych: appropriate behavior,anxious appearance and mood  Results for orders placed or performed in visit  on 02/16/18 (from the past 72 hour(s))  Glucose (CBG)     Status: Abnormal   Collection Time: 02/16/18 11:31 AM  Result Value Ref Range   POC Glucose 121 (A) 70 - 99 mg/dl    Assessment/Plan:  Nausea and vomiting Chronic issue with no change. Patient has been to see GI and had normal EGD with normal biopsy results. Encouraged patient to continue to follow up with GI. She is scheduled for colonoscopy later this month.  Tachycardia EKG normal sinus rhythm with mild sinus tachycardia.  Shortness of breath Unclear etiology at this time. Differential includes developing heart failure vs Pickwickian Syndrome vs Anemia vs Thyroid dysfunction vs PE vs anxiety. Will obtain BNP, CXR, CBC, CMP today. Glucose POCT was normal so unlikely to be hypo or hyperglycemia. She was evaluated for OSA and was not found to have Obstructive sleep apnea so Pickwickian Syndrome is less likely. Heart failure unlikely in such an acute event with no history of heart failure also EKG was normal and I would expect as least some changes on EKG. Anemia would also have a more insidious onset. I considered PE but tachycardia resolved while at the clinic, she had normal O2 sats, no history of chest pain or current episodes of chest pain. Obtaining GAD-7 today as well which was 12. I suspect some of her symptoms are related to poorly controlled anxiety although patient denies feeling anxious however this should be done after excluding other physiologic causes.  Strict return precautions discussed with patient. If  her SOB gets worse, if she develops chest pain, or if she has leg pain, or syncope to go to the ED immediately.  PATIENT EDUCATION PROVIDED: See AVS    Diagnosis and plan along with any newly prescribed medication(s) were discussed in detail with this patient today. The patient verbalized understanding and agreed with the plan. Patient advised if symptoms worsen return to clinic or ER.   Health Maintainance:   Orders  Placed This Encounter  Procedures  . DG Chest 2 View    Standing Status:   Future    Standing Expiration Date:   03/19/2018    Order Specific Question:   Reason for Exam (SYMPTOM  OR DIAGNOSIS REQUIRED)    Answer:   SOB    Order Specific Question:   Is patient pregnant?    Answer:   No    Order Specific Question:   Preferred imaging location?    Answer:   [facility]    Order Specific Question:   Radiology Contrast Protocol - do NOT remove file path    Answer:   [email or web address]  . Brain natriuretic peptide  . Comprehensive metabolic panel  . CBC  . TSH  . Glucose (CBG)  . EKG 12-Lead  . EKG 12-Lead    Ordered by an unspecified provider     No orders of the defined types were placed in this encounter.   Jules Schick, DO 02/16/2018, 10:54 AM PGY-2 [facility]

## 2018-02-16 NOTE — Telephone Encounter (Signed)
Pt informed. Jamas Jaquay, CMA  

## 2018-02-16 NOTE — Assessment & Plan Note (Signed)
Chronic issue with no change. Patient has been to see GI and had normal EGD with normal biopsy results. Encouraged patient to continue to follow up with GI. She is scheduled for colonoscopy later this month.

## 2018-02-16 NOTE — Assessment & Plan Note (Signed)
EKG normal sinus rhythm with mild sinus tachycardia.

## 2018-02-16 NOTE — Telephone Encounter (Signed)
Patient left message asking for a prescription for Hydroxyzine to help her sleep. Stated melatonin out of stock at her drug store.  Ples SpecterAlisa Dawanna Grauberger, RN [facility] RN)

## 2018-02-16 NOTE — Assessment & Plan Note (Addendum)
Unclear etiology at this time. Differential includes developing heart failure vs Pickwickian Syndrome vs Anemia vs Thyroid dysfunction vs PE vs anxiety. Will obtain BNP, CXR, CBC, CMP today. Glucose POCT was normal so unlikely to be hypo or hyperglycemia. She was evaluated for OSA and was not found to have Obstructive sleep apnea so Pickwickian Syndrome is less likely. Heart failure unlikely in such an acute event with no history of heart failure also EKG was normal and I would expect as least some changes on EKG. Anemia would also have a more insidious onset. I considered PE but tachycardia resolved while at the clinic, she had normal O2 sats, no history of chest pain or current episodes of chest pain. Obtaining GAD-7 today as well which was 12. I suspect some of her symptoms are related to poorly controlled anxiety although patient denies feeling anxious however this should be done after excluding other physiologic causes.

## 2018-02-16 NOTE — Progress Notes (Signed)
Refilled hydroxyzine to help aid with sleep as originally wrote for melatonin but patient called and said pharmacy is out.

## 2018-02-16 NOTE — Patient Instructions (Signed)
It was great to meet you today! Thank you for letting me participate in your care!  Today, we discussed your shortness of breath and leg heaviness. Your initial work up for blood glucose and EKG were both normal. I will draw some other labs and will notify you if the results are abnormal. I would also like [facility] to [facility] today and get a Chest X-ray. I will notify you of those results once they are back and I will have you follow up in one week.  Be well, Jules Schickim Michae Grimley, DO PGY-2, [facility]

## 2018-02-17 LAB — COMPREHENSIVE METABOLIC PANEL
ALK PHOS: 69 IU/L (ref 39–117)
ALT: 19 IU/L (ref 0–32)
AST: 16 IU/L (ref 0–40)
Albumin/Globulin Ratio: 1.3 (ref 1.2–2.2)
Albumin: 3.5 g/dL (ref 3.5–5.5)
BILIRUBIN TOTAL: 0.2 mg/dL (ref 0.0–1.2)
BUN/Creatinine Ratio: 19 (ref 9–23)
BUN: 15 mg/dL (ref 6–24)
CHLORIDE: 107 mmol/L — AB (ref 96–106)
CO2: 19 mmol/L — AB (ref 20–29)
Calcium: 9.5 mg/dL (ref 8.7–10.2)
Creatinine, Ser: 0.81 mg/dL (ref 0.57–1.00)
GFR calc Af Amer: 94 mL/min/{1.73_m2} (ref >59)
GFR calc non Af Amer: 81 mL/min/{1.73_m2} (ref >59)
GLUCOSE: 105 mg/dL — AB (ref 65–99)
Globulin, Total: 2.7 g/dL (ref 1.5–4.5)
Potassium: 4.5 mmol/L (ref 3.5–5.2)
Sodium: 143 mmol/L (ref 134–144)
TOTAL PROTEIN: 6.2 g/dL (ref 6.0–8.5)

## 2018-02-17 LAB — BRAIN NATRIURETIC PEPTIDE: BNP: 68.4 pg/mL (ref 0.0–100.0)

## 2018-02-17 LAB — TSH: TSH: <0.006 u[IU]/mL — ABNORMAL LOW (ref 0.450–4.500)

## 2018-02-17 LAB — CBC
HEMOGLOBIN: 10.6 g/dL — AB (ref 11.1–15.9)
Hematocrit: 32.8 % — ABNORMAL LOW (ref 34.0–46.6)
MCH: 26.4 pg — ABNORMAL LOW (ref 26.6–33.0)
MCHC: 32.3 g/dL (ref 31.5–35.7)
MCV: 82 fL (ref 79–97)
Platelets: 385 10*3/uL (ref 150–450)
RBC: 4.01 x10E6/uL (ref 3.77–5.28)
RDW: 15.1 % (ref 12.3–15.4)
WBC: 6.5 10*3/uL (ref 3.4–10.8)

## 2018-02-21 ENCOUNTER — Telehealth: Payer: Self-pay

## 2018-02-21 NOTE — Telephone Encounter (Signed)
Patient left message that she is returning call. No message found. Will route to team to advise.  Call back is  [phone]   Ples SpecterAlisa Janet Decesare, RN [facility] RN)

## 2018-02-22 NOTE — Telephone Encounter (Signed)
Patient left another message that she is returning a call. No phone call found. No notes on latest lab work. Will route to ordering physician to advise.   Call back is [phone]  Ples SpecterAlisa Brake, RN [facility] RN)

## 2018-02-27 ENCOUNTER — Ambulatory Visit (INDEPENDENT_AMBULATORY_CARE_PROVIDER_SITE_OTHER): Payer: Medicaid Other | Admitting: Orthopedic Surgery

## 2018-02-27 ENCOUNTER — Encounter: Payer: Self-pay | Admitting: Family Medicine

## 2018-02-27 DIAGNOSIS — R7989 Other specified abnormal findings of blood chemistry: Secondary | ICD-10-CM

## 2018-02-27 NOTE — Progress Notes (Signed)
Sending lab work to patient. I have already called her on the phone and she will return to the clinic to have further work up for her low TSH.  I have already entered in T3/T4 labs

## 2018-02-28 ENCOUNTER — Other Ambulatory Visit: Payer: Self-pay

## 2018-03-05 IMAGING — CT CT ANGIO CHEST
4 of 12 series · 19 of 46 positions shown · IV contrast (OMNI)
Comparison: Chest radiograph performed earlier today [DATE] p.m., and
CT of the chest performed 02/22/2016

CLINICAL DATA: Acute onset of generalized chest pain and cough.
Initial encounter.

EXAM:
CT ANGIOGRAPHY CHEST WITH CONTRAST
TECHNIQUE: Multidetector CT imaging of the chest was performed using the
standard protocol during bolus administration of intravenous
contrast. Multiplanar CT image reconstructions and MIPs were
obtained to evaluate the vascular anatomy. The study was repeated,
as the initial study did not trigger automatic contrast injection.
CONTRAST:  161 mL of Isovue 370 IV contrast

[Series 4: pe 3.0 i30f 1 · axial · 0.65mm/px · z∈[+1375,+1462]mm · 2 of 89 slices shown (1 of 2)]
[im 30/89  lung]
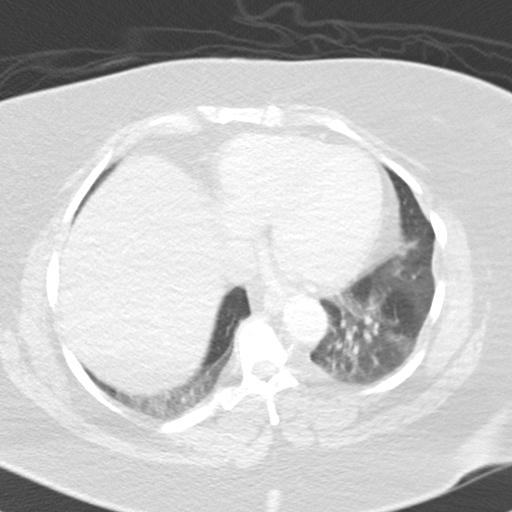
[im 59/89  lung]
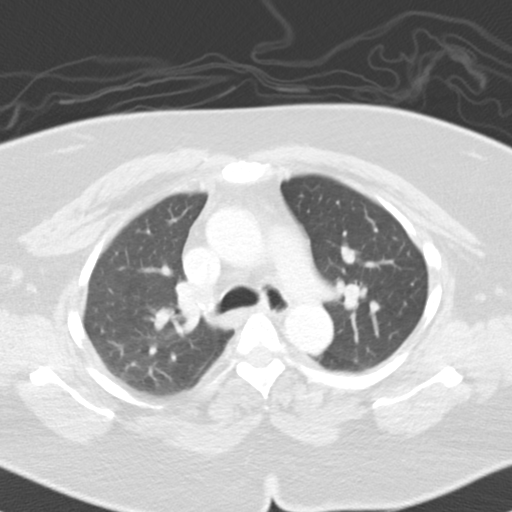

[Series 9: thins · axial · 0.58mm/px · z∈[+1230,+1450]mm · 11 of 266 slices shown]
[im 23/266  lung]
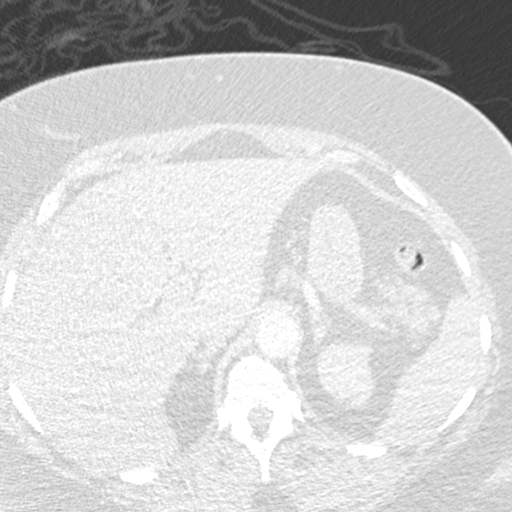
[im 45/266  soft-tissue]
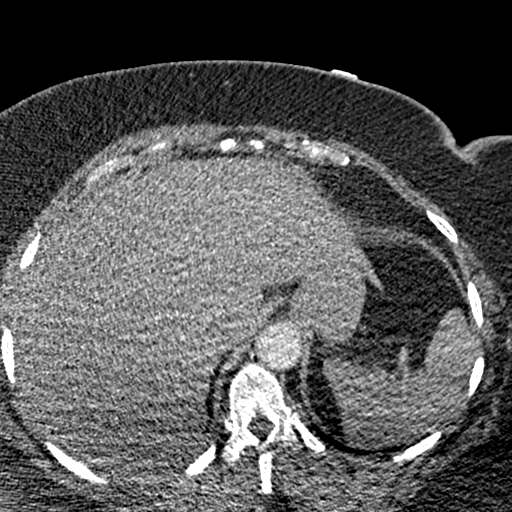
[im 67/266  lung]
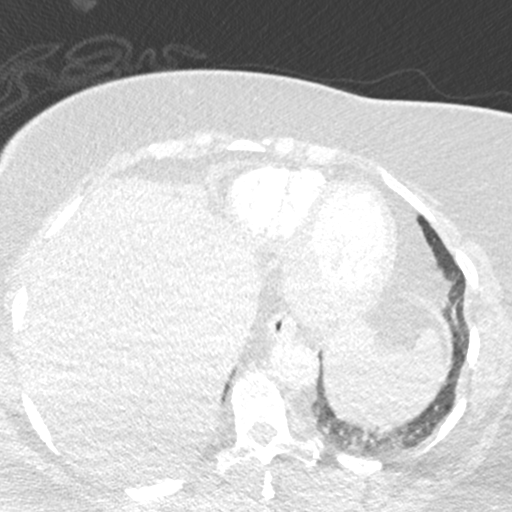
[im 89/266  soft-tissue]
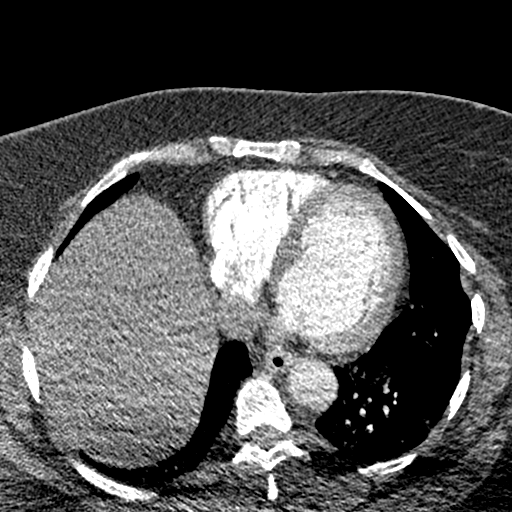
[im 111/266  lung]
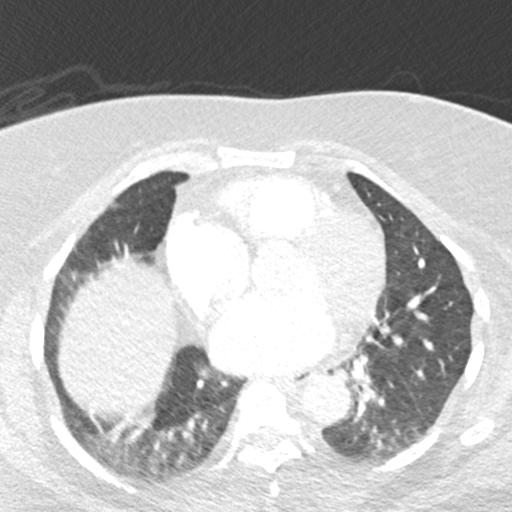
[im 133/266  soft-tissue]
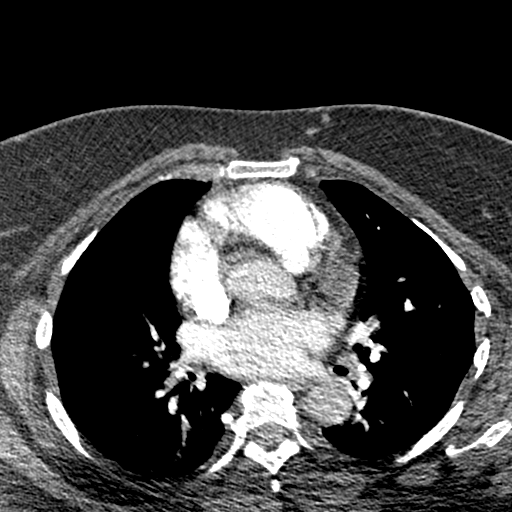
[im 155/266  lung]
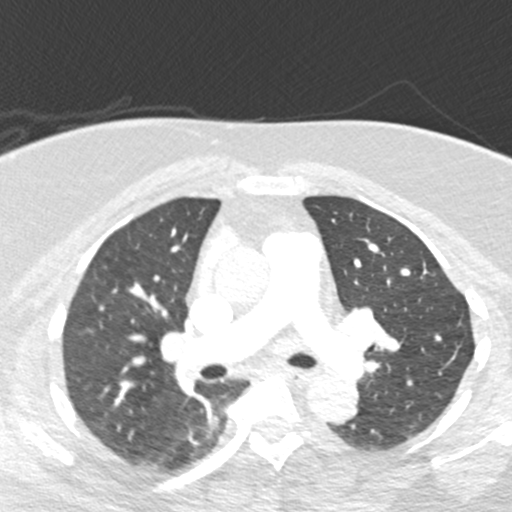
[im 177/266  soft-tissue]
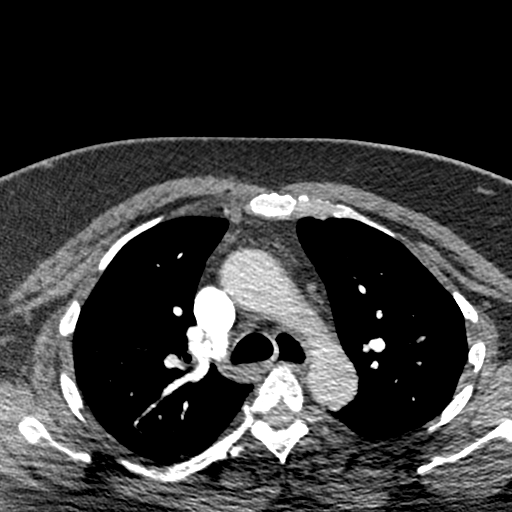
[im 199/266  lung]
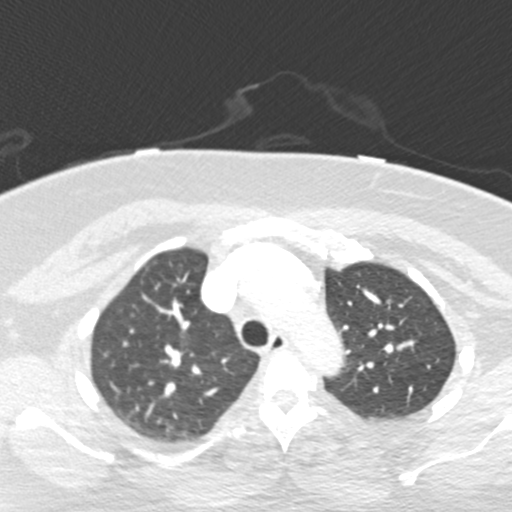
[im 221/266  soft-tissue]
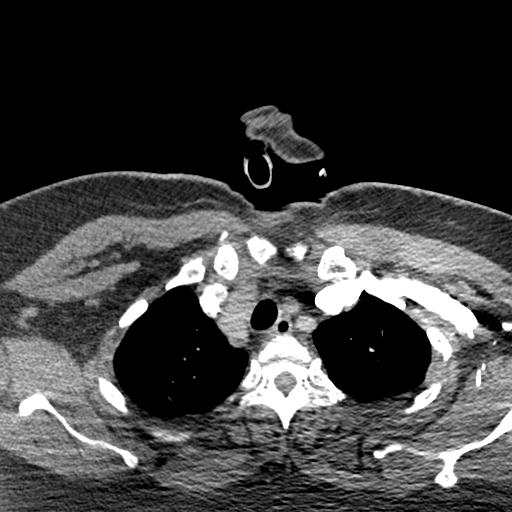
[im 243/266  lung]
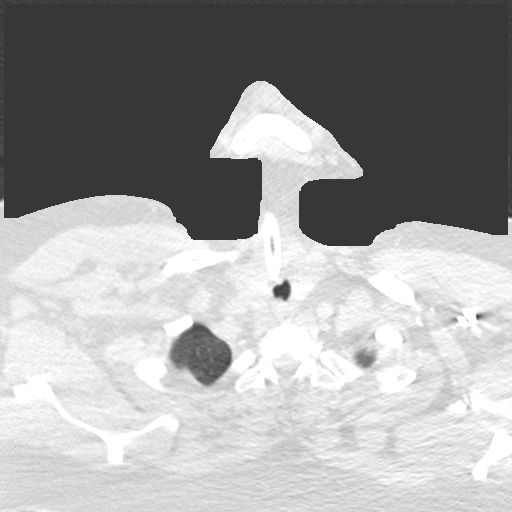

[Series 11: coronal mpr · coronal · 0.52mm/px · 3 of 119 slices shown]
[im 30/119  soft-tissue]
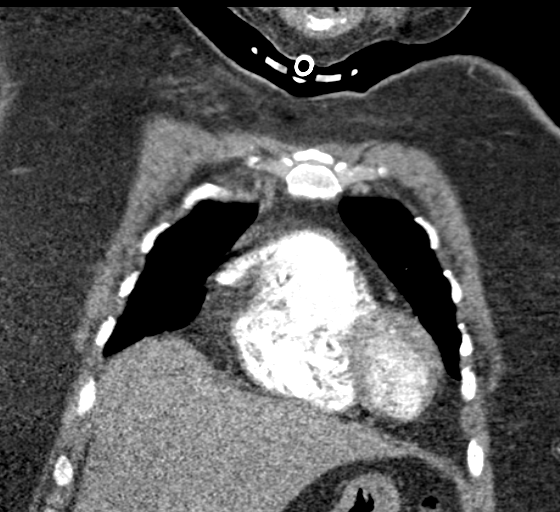
[im 60/119  soft-tissue]
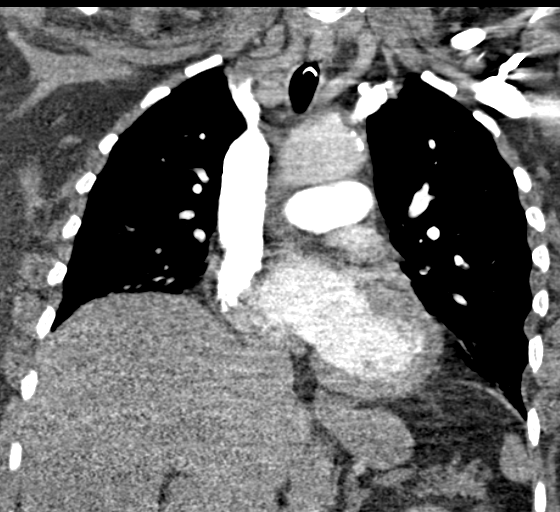
[im 89/119  soft-tissue]
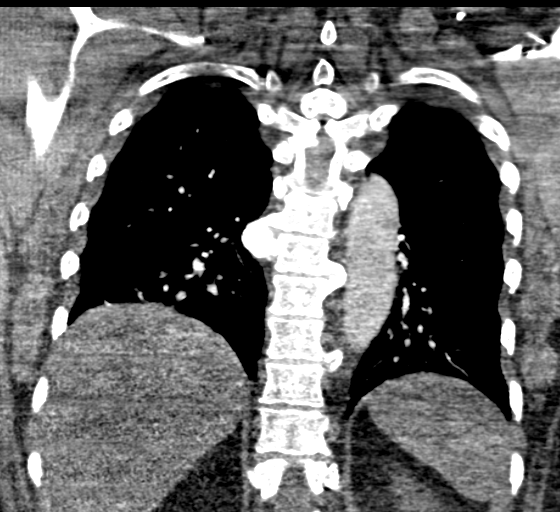

[Series 15: pe 3.0 i30f 1 · axial · 0.65mm/px · z∈[+1354,+1486]mm · 3 of 89 slices shown (2 of 2)]
[im 23/89  lung]
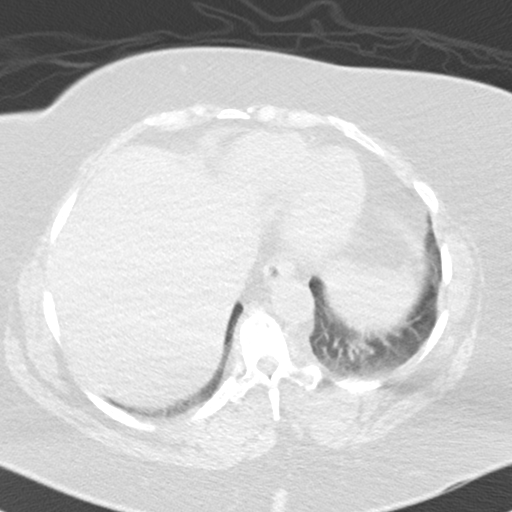
[im 45/89  lung]
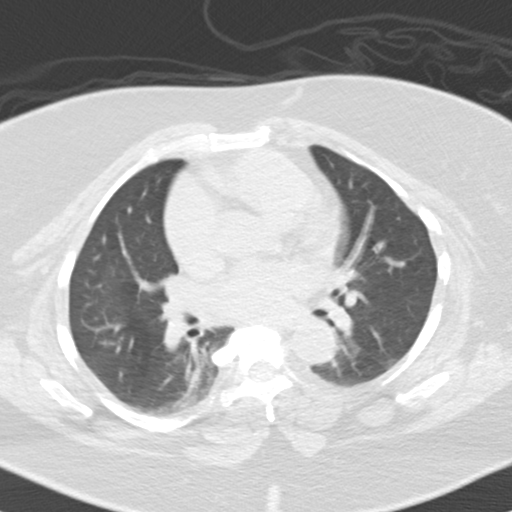
[im 67/89  lung]
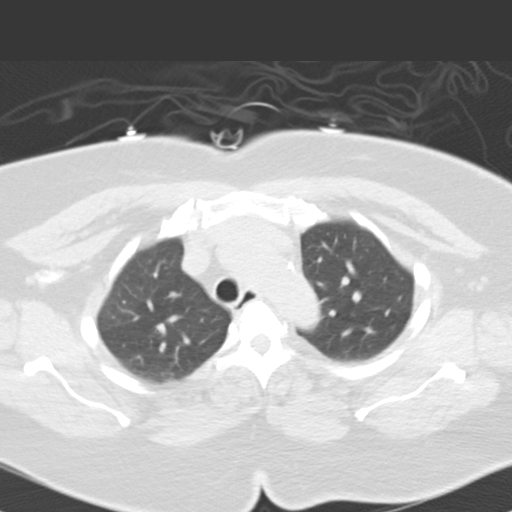

[19 of 46 positions shown; findings below may reference images not displayed]

FINDINGS: There is no evidence of pulmonary embolus.

Mild bibasilar atelectasis is noted. The lungs are otherwise clear.
There is no evidence of significant focal consolidation, pleural
effusion or pneumothorax. No masses are identified; no abnormal
focal contrast enhancement is seen.

The mediastinum is unremarkable in appearance. No mediastinal
lymphadenopathy is seen. No pericardial effusion is identified. The
great vessels are grossly unremarkable in appearance. The patient's
tracheostomy tube is again noted. No axillary lymphadenopathy is
seen. The visualized portions of the thyroid gland are unremarkable
in appearance.

The visualized portions of the liver and spleen are unremarkable.
The visualized portions of the pancreas, stomach, adrenal glands and
kidneys are within normal limits.

No acute osseous abnormalities are seen.

Review of the MIP images confirms the above findings.
IMPRESSION: 1. No evidence of pulmonary embolus.
2. Mild bibasilar atelectasis noted.  Lungs otherwise clear.

## 2018-03-06 ENCOUNTER — Ambulatory Visit
Admission: RE | Admit: 2018-03-06 | Discharge: 2018-03-06 | Disposition: A | Discharge status: Home / Self Care | Payer: Medicaid Other | Source: Ambulatory Visit | Attending: Orthopedic Surgery | Admitting: Orthopedic Surgery

## 2018-03-06 DIAGNOSIS — M79641 Pain in right hand: Secondary | ICD-10-CM

## 2018-03-07 ENCOUNTER — Encounter: Payer: Self-pay | Admitting: Gastroenterology

## 2018-03-08 ENCOUNTER — Ambulatory Visit: Payer: Self-pay | Admitting: Neurology

## 2018-03-08 ENCOUNTER — Other Ambulatory Visit: Payer: Self-pay | Admitting: Family Medicine

## 2018-03-09 ENCOUNTER — Other Ambulatory Visit: Payer: Self-pay | Admitting: Gastroenterology

## 2018-03-15 ENCOUNTER — Encounter (INDEPENDENT_AMBULATORY_CARE_PROVIDER_SITE_OTHER): Payer: Self-pay | Admitting: Orthopedic Surgery

## 2018-03-15 ENCOUNTER — Ambulatory Visit (INDEPENDENT_AMBULATORY_CARE_PROVIDER_SITE_OTHER): Payer: Medicaid Other | Admitting: Orthopedic Surgery

## 2018-03-15 DIAGNOSIS — M79641 Pain in right hand: Secondary | ICD-10-CM | POA: Diagnosis not present

## 2018-03-17 ENCOUNTER — Encounter (INDEPENDENT_AMBULATORY_CARE_PROVIDER_SITE_OTHER): Payer: Self-pay | Admitting: Orthopedic Surgery

## 2018-03-17 NOTE — Progress Notes (Signed)
Office Visit Note   Patient: Yesenia [name]           Date of Birth: 1961/01/20           MRN: [id number] Visit Date: 03/15/2018 Requested by: Freddrick March, MD [address] [place], [place] [address] PCP: Freddrick March, MD  Subjective: Chief Complaint  Patient presents with  . Right Hand - Follow-up    HPI: Patient presents with right hand pain.  Since I seen her she is had MRI scan of the right hand which shows only mild CMC arthritis.  She reports loss of dexterity as well as pain in digits 4 and 5.  She states that it is hard for her to hold the remote control.              ROS: All systems reviewed are negative as they relate to the chief complaint within the history of present illness.  Patient denies  fevers or chills.   Assessment & Plan: Visit Diagnoses:  1. Right hand pain     Plan: Impression is right hand pain which at this point with normal MRI scan looks like it may be coming from ulnar nerve compression.  We will get EMG nerve study to evaluate for ulnar nerve compression and see her back after that study.  Follow-Up Instructions: No follow-ups on file.   Orders:  Orders Placed This Encounter  Procedures  . Ambulatory referral to Physical Medicine Rehab   No orders of the defined types were placed in this encounter.     Procedures: No procedures performed   Clinical Data: No additional findings.  Objective: Vital Signs: There were no vitals taken for this visit.  Physical Exam:   Constitutional: Patient appears well-developed HEENT:  Head: Normocephalic Eyes:EOM are normal Neck: Normal range of motion Cardiovascular: Normal rate Pulmonary/chest: Effort normal Neurologic: Patient is alert Skin: Skin is warm Psychiatric: Patient has normal mood and affect    Ortho Exam: Ortho exam demonstrates full active and passive range of motion of the right hand.  She has good EPL FPL interosseous strength.  Negative Tinel's cubital tunnel in the elbow.   Pronation supination range of motion is full.  No other masses lymphadenopathy or skin changes noted in that right hand region.  Radial pulses intact.  No real tenderness at the [facility] joint or at the snuffbox.  No interosseous wasting.  No subluxation of the ulnar nerve at the elbow.  Specialty Comments:  No specialty comments available.  Imaging: No results found.   PMFS History: Patient Active Problem List   Diagnosis Date Noted  . Dizziness 01/10/2018  . Swelling of right ring finger 12/06/2017  . Tremor 10/25/2017  . Dysphagia 08/16/2017  . Nausea and vomiting   . Abdominal pain, epigastric   . MDD (major depressive disorder), recurrent severe, without psychosis (HCC)   . AKI (acute kidney injury) (HCC) 08/15/2017  . Sleep apnea 03/20/2017  . Acute on chronic respiratory failure with hypoxemia (HCC) 09/30/2016  . Acute tracheitis 09/30/2016  . Abscess   . Complication of tracheostomy (HCC) 06/23/2016  . Bleeding 06/23/2016  . Iron deficiency anemia 05/26/2016  . 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 Irritable bowel syndrome 05/21/2016  . Anxiety state 05/21/2016  . Tracheostomy dependent (HCC)   . Generalized weakness 04/13/2016  . Weakness generalized 04/13/2016  . HCAP (healthcare-associated pneumonia) 03/06/2016  . Fibromyalgia 02/22/2016  . Cellulitis 02/21/2016  . Abdominal pain 02/16/2016  . Constipation 02/16/2016  . CKD (  chronic kidney disease) stage 3, GFR 30-59 ml/min (HCC) 02/16/2016  . Fever   . Cough   . OSA (obstructive sleep apnea) 02/13/2016  . Insomnia 08/10/2015  . Polyneuropathy 07/10/2015  . GERD (gastroesophageal reflux disease) 07/10/2015  . Acute kidney injury superimposed on chronic kidney disease (HCC) 07/07/2015  . Gait disturbance 07/07/2015  . Morbid (severe) obesity due to excess calories (HCC) complicated by OSA 07/07/2015  . Left hip pain 07/06/2015  . Chronic pain   . Right sided weakness   . Headache(784.0) 04/14/2013  .  Encephalopathy acute 04/11/2013  . Type 2 diabetes mellitus with stage 3 chronic kidney disease, without long-term current use of insulin (HCC) 04/11/2013  . Dyslipidemia 04/11/2013  . Tachycardia 02/15/2013  . Chest pain 02/15/2013  . Shortness of breath 02/15/2013  . Cerebral infarction (HCC) 01/14/2013  . Essential hypertension 01/14/2013  . Anemia 01/14/2013   Past Medical History:  Diagnosis Date  . Acute stomach ulcer 08/15/2017  . AKI (acute kidney injury) (HCC)    Acute kidney injury on CKD stage II-III/notes 08/17/2017  . Anemia   . Anemia   . Arthritis    "knees" (08/17/2017)  . Chronic pain   . CKD (chronic kidney disease)    Stage 3  . Depression   . Fibromyalgia    hospitilized 12/16 due to inability to walk  . Gastric ulcer   . History of gastritis 2019  . Hyperlipemia   . Hypertension   . Insomnia   . Neuropathy   . Right sided weakness   . Sleep apnea    "CPAPs didn't work for me; that's why they did OR" (08/17/2017)  . Stroke [facility]) 2014?   no residual weakness   . Type II diabetes mellitus (HCC)     does not check CBG's (08/17/2017)  . Vitamin deficiency    Vit D    Family History  Problem Relation Age of Onset  . Breast cancer Mother   . Leukemia Brother   . Breast cancer Maternal Grandmother     Past Surgical History:  Procedure Laterality Date  . ESOPHAGOGASTRODUODENOSCOPY (EGD) WITH PROPOFOL N/A 08/16/2017   Procedure: ESOPHAGOGASTRODUODENOSCOPY (EGD) WITH PROPOFOL;  Surgeon: Benancio Deeds, MD;  Location: [facility] ENDOSCOPY;  Service: Gastroenterology;  Laterality: N/A;  . KNEE ARTHROPLASTY  1997   "stuck rods in it"  . KNEE ARTHROSCOPY Right 1992, 1995,  . OOPHORECTOMY Right 1993?  . TRACHEOSTOMY TUBE PLACEMENT N/A 02/13/2016   Procedure: TRACHEOSTOMY;  Surgeon: Christia Reading, MD;  Location: [facility] OR;  Service: ENT;  Laterality: N/A;  . TUBAL LIGATION     Social History   Occupational History  . Occupation: [employer]  Tobacco Use  . Smoking status:  Never Smoker  . Smokeless tobacco: Never Used  Substance and Sexual Activity  . Alcohol use: No    Alcohol/week: 0.0 standard drinks  . Drug use: No  . Sexual activity: Never

## 2018-03-23 ENCOUNTER — Encounter: Payer: Self-pay | Admitting: Family Medicine

## 2018-03-23 ENCOUNTER — Ambulatory Visit: Payer: Medicaid Other | Admitting: Family Medicine

## 2018-03-23 ENCOUNTER — Other Ambulatory Visit: Payer: Self-pay

## 2018-03-23 DIAGNOSIS — G4700 Insomnia, unspecified: Secondary | ICD-10-CM | POA: Diagnosis not present

## 2018-03-23 DIAGNOSIS — K589 Irritable bowel syndrome without diarrhea: Secondary | ICD-10-CM

## 2018-03-23 MED ORDER — TRAZODONE HCL 50 MG PO TABS: 50.0000 mg | ORAL_TABLET | Freq: Every day | ORAL | 0 refills | Status: DC

## 2018-03-23 NOTE — Progress Notes (Signed)
   Subjective:   Patient ID: Yesenia [name]    DOB: Oct 15, 1960, 57 y.o. female   MRN: [id number]  CC: sleep disturbance, f/u IBS  HPI: Yesenia [name] is a 57 y.o. female who presents to clinic today for the following issues.  Sleep disturbance Having difficulty sleeping, states she does not have sleep apnea anymore. Tries to go to bed around 8-9 pm, tends to wake up around 1 am and then is not able to fall back asleep.  Has most trouble initiating sleep, this cycle seems to repeat itself every night.  Watches tv prior to bedtime around 8 pm, then turns it off and attempts to go to bed.  Does not use her phone when she is in bed.  Turns phone on silent.  She drinks some decaf coffee at 5pm.  Has tried melatonin and trazodone in the past which has not helped.  Calm app also has not helped.  Prescribed hydroxyzine by Dr. Karen ChafeLockamy which only helped for about 2 nights.   IBS Feels like it's getting worse.  Often diarrhea alternating with constipation.  Has a list of foods she can eat and can't eat, she has been following this pretty accurately.   Taking metoclopramide currently and follows with GI. Has a colonoscopy scheduled on 04/05/18, she has been given the bowel prep .  Last colonoscopy was 5 years ago, was found to have a couple polyps, removed and advised to follow up in 5 years for her next scope.  Has not noticed blood in stool.  No current abdominal pain, fever, chills, nausea, vomiting.   ROS: See HPI for pertinent ROS.  Social: pt is a never smoker.  Medications reviewed. Objective:   BP 126/78   Pulse 87   Temp 98.2 F (36.8 C) (Axillary)   Ht 5\' 6"  (1.676 m)   Wt 288 lb 9.6 oz (130.9 kg)   SpO2 95%   BMI 46.58 kg/m  Vitals and nursing note reviewed.  General: 57 yo female, NAD  HEENT: NCAT, EOMI Neck: supple, non-tender, normal ROM   CV: RRR no MRG  Lungs: CTAB, normal effort  Abdomen: soft, NTND, no rebound or guarding, +bs  Skin: warm, dry, no rash Extremities: warm and well  perfused Neuro: alert, oriented x3, no focal deficits   Assessment & Plan:   Insomnia Reiterated usefulness of Calm app, she is agreeable to trying it again.  Discussed avoiding tv and electronics at bedtime.  Avoid caffeinated beverages close to bedtime.  Discussed trying all of these modalities in combination with trazodone to see if she notices an improvement in symptoms  -Refill trazodone 50 mg tabs   Irritable bowel syndrome Reports recent worsening.  Taking metoclopramide and follows IBS diet.  Exam unremarkable and no signs of acute abdomen. She follows with GI and is scheduled for colonoscopy on 9/25.   Will await results of this for further eval.   Meds ordered this encounter  Medications  . traZODone (DESYREL) 50 MG tablet    Sig: Take 1 tablet (50 mg total) by mouth at bedtime.    Dispense:  60 tablet    Refill:  0   Freddrick MarchYashika Elina Streng, MD [facility] PGY-3

## 2018-03-23 NOTE — Patient Instructions (Addendum)
It was nice seeing you again today.  You are seen in clinic for follow-up of your IBS and sleep disturbance.  We discussed, I would like you to continue taking trazodone and use the calm app at bedtime to help you relax.  It is important to avoid caffeinated beverages and avoid electronics at bedtime as both of these can cause difficulty falling asleep.  For your IBS, I would like you to keep your scheduled colonoscopy on 9/25 and we will follow-up with these results.  For your dizziness, it is possible that you may be having symptoms of vertigo again.  If dizziness continues, I would like you to make an appointment to be seen and we can consider restarting Antivert.  Please call clinic if you have any questions.  Be well, Freddrick MarchYashika Jaidon Sponsel MD

## 2018-03-24 ENCOUNTER — Telehealth: Payer: Self-pay | Admitting: *Deleted

## 2018-03-24 NOTE — Telephone Encounter (Signed)
Pt states that she spoke to the pharmacy and they told her that linzesss would likely be covered (but may need a PA).  She would like to request a script sent to her pharmacy. Aleanna Menge, Maryjo RochesterJessica Dawn, CMA

## 2018-03-29 ENCOUNTER — Other Ambulatory Visit: Payer: Self-pay | Admitting: Family Medicine

## 2018-03-29 ENCOUNTER — Telehealth: Payer: Self-pay

## 2018-03-29 MED ORDER — LINACLOTIDE 72 MCG PO CAPS: 72.0000 ug | ORAL_CAPSULE | Freq: Every day | ORAL | 3 refills | Status: DC

## 2018-03-29 NOTE — Assessment & Plan Note (Signed)
Reiterated usefulness of Calm app, she is agreeable to trying it again.  Discussed avoiding tv and electronics at bedtime.  Avoid caffeinated beverages close to bedtime.  Discussed trying all of these modalities in combination with trazodone to see if she notices an improvement in symptoms  -Refill trazodone 50 mg tabs

## 2018-03-29 NOTE — Telephone Encounter (Signed)
Called and informed patient of RX for Linzess.  Yesenia [name], Michelle R, CMA

## 2018-03-29 NOTE — Assessment & Plan Note (Addendum)
Reports recent worsening.  Taking metoclopramide and follows IBS diet.  Exam unremarkable and no signs of acute abdomen. She follows with GI and is scheduled for colonoscopy on 9/25.   Will await results of this for further eval.

## 2018-03-29 NOTE — Telephone Encounter (Signed)
Have ordered the linzess, please inform patient.

## 2018-03-30 ENCOUNTER — Other Ambulatory Visit: Payer: Self-pay | Admitting: Family Medicine

## 2018-03-31 ENCOUNTER — Encounter: Payer: Self-pay | Admitting: Gastroenterology

## 2018-03-31 ENCOUNTER — Ambulatory Visit (AMBULATORY_SURGERY_CENTER): Payer: Medicaid Other | Admitting: Gastroenterology

## 2018-03-31 VITALS — BP 112/68 | HR 102 | Temp 98.7°F | Resp 20 | Ht 66.0 in | Wt 294.0 lb

## 2018-03-31 DIAGNOSIS — Z86010 Personal history of colonic polyps: Secondary | ICD-10-CM

## 2018-03-31 DIAGNOSIS — K529 Noninfective gastroenteritis and colitis, unspecified: Secondary | ICD-10-CM | POA: Diagnosis not present

## 2018-03-31 MED ORDER — SODIUM CHLORIDE 0.9 % IV SOLN: 500.0000 mL | Freq: Once | INTRAVENOUS | Status: DC

## 2018-03-31 NOTE — Patient Instructions (Signed)
Poor prep.  Please call us to reschedule colonoscopy.  Await biopsies to see if microscopic colitis is present in your colon.   YOU HAD AN ENDOSCOPIC PROCEDURE TODAY AT THE [facility]:   Refer to the procedure report that was given to you for any specific questions about what was found during the examination.  If the procedure report does not answer your questions, please call your gastroenterologist to clarify.  If you requested that your care partner not be given the details of your procedure findings, then the procedure report has been included in a sealed envelope for you to review at your convenience later.  YOU SHOULD EXPECT: Some feelings of bloating in the abdomen. Passage of more gas than usual.  Walking can help get rid of the air that was put into your GI tract during the procedure and reduce the bloating. If you had a lower endoscopy (such as a colonoscopy or flexible sigmoidoscopy) you may notice spotting of blood in your stool or on the toilet paper. If you underwent a bowel prep for your procedure, you may not have a normal bowel movement for a few days.  Please Note:  You might notice some irritation and congestion in your nose or some drainage.  This is from the oxygen used during your procedure.  There is no need for concern and it should clear up in a day or so.  SYMPTOMS TO REPORT IMMEDIATELY:   Following lower endoscopy (colonoscopy or flexible sigmoidoscopy):  Excessive amounts of blood in the stool  Significant tenderness or worsening of abdominal pains  Swelling of the abdomen that is new, acute  Fever of 100F or higher   For urgent or emergent issues, a gastroenterologist can be reached at any hour by calling [phone].   DIET:  We do recommend a small meal at first, but then you may proceed to your regular diet.  Drink plenty of fluids but you should avoid alcoholic beverages for 24 hours.  ACTIVITY:  You should plan to take it easy for the rest  of today and you should NOT DRIVE or use heavy machinery until tomorrow (because of the sedation medicines used during the test).    FOLLOW UP: Our staff will call the number listed on your records the next business day following your procedure to check on you and address any questions or concerns that you may have regarding the information given to you following your procedure. If we do not reach you, we will leave a message.  However, if you are feeling well and you are not experiencing any problems, there is no need to return our call.  We will assume that you have returned to your regular daily activities without incident.  If any biopsies were taken you will be contacted by phone or by letter within the next 1-3 weeks.  Please call us at [phone] if you have not heard about the biopsies in 3 weeks.    SIGNATURES/CONFIDENTIALITY: You and/or your care partner have signed paperwork which will be entered into your electronic medical record.  These signatures attest to the fact that that the information above on your After Visit Summary has been reviewed and is understood.  Full responsibility of the confidentiality of this discharge information lies with you and/or your care-partner.

## 2018-03-31 NOTE — Op Note (Addendum)
[facility] Patient Name: Brantley StageSonja Ikner Procedure Date: 03/31/2018 2:48 PM MRN: [id number] Endoscopist: Viviann SpareSteven P. Adela LankArmbruster , MD Age: 57 Referring MD:  Date of Birth: 04-27-1961 Gender: Female Account #: [id number] Procedure:                Colonoscopy Indications:              High risk colon cancer surveillance: Personal                            history of colonic polyps, patient also with                            chronic diarrhea Medicines:                Monitored Anesthesia Care Procedure:                Pre-Anesthesia Assessment:                           - Prior to the procedure, a History and Physical                            was performed, and patient medications and                            allergies were reviewed. The patient's tolerance of                            previous anesthesia was also reviewed. The risks                            and benefits of the procedure and the sedation                            options and risks were discussed with the patient.                            All questions were answered, and informed consent                            was obtained. Prior Anticoagulants: The patient has                            taken no previous anticoagulant or antiplatelet                            agents. ASA Grade Assessment: III - A patient with                            severe systemic disease. After reviewing the risks                            and benefits, the patient was deemed in  satisfactory condition to undergo the procedure.                           After obtaining informed consent, the colonoscope                            was passed under direct vision. Throughout the                            procedure, the patient's blood pressure, pulse, and                            oxygen saturations were monitored continuously. The                            Colonoscope was introduced through the anus  and                            advanced to the the cecum, identified by                            appendiceal orifice and ileocecal valve. The                            colonoscopy was performed without difficulty. The                            patient tolerated the procedure well. The quality                            of the bowel preparation was poor. The ileocecal                            valve, appendiceal orifice, and rectum were                            photographed. Scope In: 2:53:50 PM Scope Out: 2:59:05 PM Scope Withdrawal Time: 0 hours 2 minutes 58 seconds  Total Procedure Duration: 0 hours 5 minutes 15 seconds  Findings:                 The perianal and digital rectal examinations were                            normal.                           A large amount of semi-liquid stool was found in                            the entire colon, making visualization difficult.                            The prep was worst in the right colon, due to  volume of stool present could not be cleared.                            Overall the bowel prep was inadequate for                            surveillance purposes.                           Of the visualized colonic mucosa, no inflammatory                            changes were noted. No mass lesions appreciated. Of                            note, retroflexed views not obtained due to                            retained stool in rectal vault                           Biopsies for histology were taken with a cold                            forceps from the right colon and left colon for                            evaluation of microscopic colitis. Complications:            No immediate complications. Estimated blood loss:                            Minimal. Estimated Blood Loss:     Estimated blood loss was minimal. Impression:               - Preparation of the colon was poor and inadequate                             for screening purposes.                           - Of what was seen of the colonic mucosa, no                            inflammatory changes were noted.                           - Biopsies were taken with a cold forceps from the                            right colon and left colon for evaluation of                            microscopic colitis. Recommendation:           - Patient has  a contact number available for                            emergencies. The signs and symptoms of potential                            delayed complications were discussed with the                            patient. Return to normal activities tomorrow.                            Written discharge instructions were provided to the                            patient.                           - Resume previous diet.                           - Continue present medications.                           - Await pathology results.                           - Repeat colonoscopy because the bowel preparation                            was suboptimal, to be scheduled at the patient's                            convenience. Viviann Spare P. Armbruster, MD 03/31/2018 3:04:35 PM This report has been signed electronically.

## 2018-03-31 NOTE — Progress Notes (Signed)
Pt needs a Friday appointment and none are available in September or October.  Recall put in and pt will receive letter to make follow up colonoscopy appointment after November 1st

## 2018-03-31 NOTE — Progress Notes (Signed)
1501 Report given to PACU, vss   1505 BP 78/53 with ephedrine IV given, remain at bedside.  1510 ephedrine IV give,  SDp in the 80s, Dr Ellin SabaArmbuster at bedside and aware.  1515 Ephedrine given again, SBP remains in the 80s.   1520 BP is 100/49, Dr Ellin SabaArmbuster aware, vss

## 2018-03-31 NOTE — Progress Notes (Signed)
Called to room to assist during endoscopic procedure.  Patient ID and intended procedure confirmed with present staff. Received instructions for my participation in the procedure from the performing physician.  

## 2018-04-03 ENCOUNTER — Telehealth: Payer: Self-pay

## 2018-04-03 ENCOUNTER — Telehealth: Payer: Self-pay | Admitting: *Deleted

## 2018-04-03 ENCOUNTER — Other Ambulatory Visit: Payer: Self-pay | Admitting: Gastroenterology

## 2018-04-03 NOTE — Telephone Encounter (Signed)
OK to refill

## 2018-04-03 NOTE — Telephone Encounter (Signed)
  Follow up Call-  Call back number 03/31/2018 01/31/2018  Post procedure Call Back phone  # [phone]  Permission to leave phone message Yes Yes  Some recent data might be hidden     Patient questions:  Do you have a fever, pain , or abdominal swelling? No. Pain Score  0 *  Have you tolerated food without any problems? Yes.    Have you been able to return to your normal activities? Yes.    Do you have any questions about your discharge instructions: Diet   No. Medications  No. Follow up visit  No.  Do you have questions or concerns about your Care? No.  Actions: * If pain score is 4 or above: No action needed, pain <4.

## 2018-04-03 NOTE — Telephone Encounter (Signed)
No answer for post procedure follow up call. Left message for patient to call back and will attempt to call back later this afternoon. Sm

## 2018-04-04 NOTE — Telephone Encounter (Signed)
Yes that's fine, thanks [name] 

## 2018-04-05 ENCOUNTER — Encounter (INDEPENDENT_AMBULATORY_CARE_PROVIDER_SITE_OTHER): Payer: Self-pay | Admitting: Physical Medicine and Rehabilitation

## 2018-04-06 ENCOUNTER — Ambulatory Visit: Payer: Self-pay | Admitting: Family Medicine

## 2018-04-11 ENCOUNTER — Other Ambulatory Visit: Payer: Self-pay

## 2018-04-11 ENCOUNTER — Emergency Department (HOSPITAL_COMMUNITY): Payer: Medicaid Other

## 2018-04-11 ENCOUNTER — Encounter (HOSPITAL_COMMUNITY): Payer: Self-pay | Admitting: Emergency Medicine

## 2018-04-11 ENCOUNTER — Emergency Department (HOSPITAL_COMMUNITY)
Admission: EM | Admit: 2018-04-11 | Discharge: 2018-04-12 | Disposition: A | Discharge status: Home / Self Care | Payer: Medicaid Other | Attending: Emergency Medicine | Admitting: Emergency Medicine

## 2018-04-11 DIAGNOSIS — N183 Chronic kidney disease, stage 3 (moderate): Secondary | ICD-10-CM | POA: Diagnosis not present

## 2018-04-11 DIAGNOSIS — E1122 Type 2 diabetes mellitus with diabetic chronic kidney disease: Secondary | ICD-10-CM | POA: Diagnosis not present

## 2018-04-11 DIAGNOSIS — Z79899 Other long term (current) drug therapy: Secondary | ICD-10-CM | POA: Insufficient documentation

## 2018-04-11 DIAGNOSIS — I129 Hypertensive chronic kidney disease with stage 1 through stage 4 chronic kidney disease, or unspecified chronic kidney disease: Secondary | ICD-10-CM | POA: Insufficient documentation

## 2018-04-11 DIAGNOSIS — M25562 Pain in left knee: Secondary | ICD-10-CM | POA: Diagnosis not present

## 2018-04-11 NOTE — ED Triage Notes (Addendum)
Per EMS, called out for Left knee pain and "some swelling" X4 days.  Pt reports sharp pn that radiates from the knee to the hip.  Pt reports she could not get off the couch, has not tried any OTC "I just thought I would come here."

## 2018-04-12 MED ORDER — DICLOFENAC SODIUM 1 % TD GEL: 2.0000 g | Freq: Four times a day (QID) | TRANSDERMAL | 0 refills | Status: DC

## 2018-04-12 MED ORDER — ACETAMINOPHEN 500 MG PO TABS
1000.0000 mg | ORAL_TABLET | Freq: Once | ORAL | Status: AC
  Administered 2018-04-12: 1000 mg via ORAL
  Filled 2018-04-12: qty 2

## 2018-04-12 MED ORDER — KETOROLAC TROMETHAMINE 15 MG/ML IJ SOLN
15.0000 mg | Freq: Once | INTRAMUSCULAR | Status: AC
  Administered 2018-04-12: 15 mg via INTRAMUSCULAR
  Filled 2018-04-12: qty 1

## 2018-04-12 NOTE — Discharge Instructions (Addendum)
It was my pleasure taking care of you today!   Please see the information and instructions below regarding your visit.  Your diagnoses today include:  1. Acute pain of left knee     Tests performed today include: See side panel of your discharge paperwork for testing performed today. Vital signs are listed at the bottom of these instructions.   Your work-up is very reassuring today.  Your knee x-ray shows that you have arthritis in the knee.  I suspect this is the cause of your symptoms given the recent walking that you did.  Medications prescribed:    Take any prescribed medications only as prescribed, and any over the counter medications only as directed on the packaging.  Tylenol, 650 mg every 6 hours as needed for pain.  Do not exceed 4000 mg in 1 day.  Please also apply the diclofenac gel I prescribed you. Use crutches as needed for comfort. Ice and elevate knee throughout the day.  Home care instructions:  Please follow any educational materials contained in this packet.   Follow-up instructions: Please follow-up with your primary care provider in  for further evaluation of your symptoms if they are not completely improved.   Call the orthopedist listed today or tomorrow to schedule follow up appointment for recheck of ongoing knee pain in one to two weeks. That appointment can be canceled with a 24-48 hour notice if complete resolution of pain.   Follow up with the orthopedist listed if symptoms do not improve in one week.   Return instructions:  Please return to the Emergency Department if you experience worsening symptoms.  Please return for any increasing swelling, increasing pain, loss of color to the lower leg, or increasing redness. Please return if you have any other emergent concerns.  Additional Information:   Your vital signs today were: BP (!) 125/53 (BP Location: Right Arm)    Pulse 91    Temp 98.6 F (37 C) (Oral)    Resp 18    Ht 5\' 7"  (1.702 m)    Wt 133.4  kg    SpO2 99%    BMI 46.05 kg/m  If your blood pressure (BP) was elevated on multiple readings during this visit above 130 for the top number or above 80 for the bottom number, please have this repeated by your primary care provider within one month. --------------

## 2018-04-12 NOTE — ED Provider Notes (Signed)
[facility] EMERGENCY DEPARTMENT Provider Note   CSN: [id number] Arrival date & time: 04/11/18  2218     History   Chief Complaint Chief Complaint  Patient presents with  . Knee Pain    HPI Yesenia [name] is a 57 y.o. female.  HPI  Patient is a 57 year old female with a history of type 2 diabetes mellitus, fibromyalgia, depression, anemia, and arthritis presenting for pain and swelling to left knee.  Patient reports that the pain has been gradually worsening over the past 4 days.  She reports that there was no particular injury when her left anterior knee began to hurt.  Patient reports that she did significant walking yesterday while running errands, and today when she tried to get up off of the couch to go answer a ring at the door, she felt unable to stand.  Patient denies any erythema to the knee, fevers, chills.  Patient denies any anticoagulation.  Patient denies any distal weakness or numbness.  Patient denies any popliteal or calf tenderness.  Past Medical History:  Diagnosis Date  . Acute stomach ulcer 08/15/2017  . AKI (acute kidney injury) (HCC)    Acute kidney injury on CKD stage II-III/notes 08/17/2017  . Anemia   . Anemia   . Arthritis    "knees" (08/17/2017)  . Blood transfusion without reported diagnosis 2012   anemia  . Chronic pain   . CKD (chronic kidney disease)    Stage 3  . Depression   . Fibromyalgia    hospitilized 12/16 due to inability to walk  . Gastric ulcer   . History of gastritis 2019  . Hyperlipemia   . Hypertension   . Insomnia   . Neuropathy   . Right sided weakness   . Sleep apnea    "CPAPs didn't work for me; that's why they did OR" (08/17/2017)  . Stroke [facility]) 2014?   no residual weakness   . Type II diabetes mellitus (HCC)     does not check CBG's (08/17/2017), no meds  . Vitamin deficiency    Vit D    Patient Active Problem List   Diagnosis Date Noted  . Dizziness 01/10/2018  . Swelling of right ring finger  12/06/2017  . Tremor 10/25/2017  . Dysphagia 08/16/2017  . Nausea and vomiting   . Abdominal pain, epigastric   . MDD (major depressive disorder), recurrent severe, without psychosis (HCC)   . AKI (acute kidney injury) (HCC) 08/15/2017  . Sleep apnea 03/20/2017  . Acute on chronic respiratory failure with hypoxemia (HCC) 09/30/2016  . Acute tracheitis 09/30/2016  . Abscess   . Complication of tracheostomy (HCC) 06/23/2016  . Bleeding 06/23/2016  . Iron deficiency anemia 05/26/2016  . 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 Irritable bowel syndrome 05/21/2016  . Anxiety state 05/21/2016  . Tracheostomy dependent (HCC)   . Generalized weakness 04/13/2016  . Weakness generalized 04/13/2016  . HCAP (healthcare-associated pneumonia) 03/06/2016  . Fibromyalgia 02/22/2016  . Cellulitis 02/21/2016  . Abdominal pain 02/16/2016  . Constipation 02/16/2016  . CKD (chronic kidney disease) stage 3, GFR 30-59 ml/min (HCC) 02/16/2016  . Fever   . Cough   . OSA (obstructive sleep apnea) 02/13/2016  . Insomnia 08/10/2015  . Polyneuropathy 07/10/2015  . GERD (gastroesophageal reflux disease) 07/10/2015  . Acute kidney injury superimposed on chronic kidney disease (HCC) 07/07/2015  . Gait disturbance 07/07/2015  . Morbid (severe) obesity due to excess calories (HCC) complicated by OSA 07/07/2015  . Left hip pain  07/06/2015  . Chronic pain   . Right sided weakness   . Headache(784.0) 04/14/2013  . Encephalopathy acute 04/11/2013  . Type 2 diabetes mellitus with stage 3 chronic kidney disease, without long-term current use of insulin (HCC) 04/11/2013  . Dyslipidemia 04/11/2013  . Tachycardia 02/15/2013  . Chest pain 02/15/2013  . Shortness of breath 02/15/2013  . Cerebral infarction (HCC) 01/14/2013  . Essential hypertension 01/14/2013  . Anemia 01/14/2013    Past Surgical History:  Procedure Laterality Date  . ESOPHAGOGASTRODUODENOSCOPY (EGD) WITH PROPOFOL N/A 08/16/2017   Procedure:  ESOPHAGOGASTRODUODENOSCOPY (EGD) WITH PROPOFOL;  Surgeon: Benancio Deeds, MD;  Location: [facility] ENDOSCOPY;  Service: Gastroenterology;  Laterality: N/A;  . KNEE ARTHROPLASTY  1997   "stuck rods in it"  . KNEE ARTHROSCOPY Right 1992, 1995,  . OOPHORECTOMY Right 1993?  . TRACHEOSTOMY TUBE PLACEMENT N/A 02/13/2016   Procedure: TRACHEOSTOMY;  Surgeon: Christia Reading, MD;  Location: [facility] OR;  Service: ENT;  Laterality: N/A;  . TUBAL LIGATION       OB History   None      Home Medications    Prior to Admission medications   Medication Sig Start Date End Date Taking? Authorizing Provider  amLODipine (NORVASC) 5 MG tablet Take 1 tablet (5 mg total) by mouth daily. 04/03/18   Freddrick March, MD  atenolol (TENORMIN) 50 MG tablet Take 50 mg by mouth daily. 02/16/16   [provider]  busPIRone (BUSPAR) 15 MG tablet Take 15 mg by mouth 2 (two) times daily. 12/28/17   [provider]  DULoxetine (CYMBALTA) 60 MG capsule Take 60 mg by mouth daily.     [provider]  gabapentin (NEURONTIN) 300 MG capsule Take 900 mg by mouth at bedtime. 08/01/17   [provider]  hydrOXYzine (ATARAX/VISTARIL) 25 MG tablet Take 1 tablet (25 mg total) by mouth at bedtime. 02/16/18   Arlyce Harman, DO  hyoscyamine (LEVSIN SL) 0.125 MG SL tablet TAKE 0.125 mg TABLET BY MOUTH FOUR TIMES DAILY FOR IBS 04/15/17   [provider]  linaclotide (LINZESS) 72 MCG capsule Take 1 capsule (72 mcg total) by mouth daily before breakfast. Patient not taking: Reported on 03/31/2018 03/29/18   Freddrick March, MD  loperamide (IMODIUM A-D) 2 MG tablet Use as needed Patient not taking: Reported on 03/31/2018 01/06/18   Armbruster, Willaim Rayas, MD  metoCLOPramide (REGLAN) 5 MG tablet Take 1 tablet (5 mg total) by mouth every 8 (eight) hours as needed for nausea. 01/06/18   Armbruster, Willaim Rayas, MD  mirtazapine (REMERON) 15 MG tablet Take 1 tablet (15 mg total) by mouth at bedtime. 02/10/18   Armbruster, Willaim Rayas,  MD  montelukast (SINGULAIR) 10 MG tablet Take 10 mg by mouth at bedtime.  10/05/17   [provider]  Omega-3 Fatty Acids (FISH OIL) 1000 MG CAPS Take 1,000 mg by mouth daily.  09/10/17   [provider]  ondansetron (ZOFRAN-ODT) 8 MG disintegrating tablet Take 1 tablet (8 mg total) by mouth every 8 (eight) hours as needed for nausea or vomiting. 04/04/18   Armbruster, Willaim Rayas, MD  pantoprazole (PROTONIX) 40 MG tablet Take 1 tablet (40 mg total) by mouth 2 (two) times daily. 08/21/17   Darlin Drop, DO  rosuvastatin (CRESTOR) 10 MG tablet Take 10 mg by mouth daily. 12/28/17   [provider]  traZODone (DESYREL) 50 MG tablet Take 1 tablet (50 mg total) by mouth at bedtime. 03/23/18   Freddrick March, MD  Vitamin D, Ergocalciferol, (  DRISDOL) 50000 units CAPS capsule Take 50,000 Units by mouth every 7 (seven) days. On Monday 02/18/17   [provider]    Family History Family History  Problem Relation Age of Onset  . Breast cancer Mother   . Leukemia Brother   . Breast cancer Maternal Grandmother   . Colon cancer Neg Hx   . Esophageal cancer Neg Hx   . Rectal cancer Neg Hx   . Stomach cancer Neg Hx     Social History Social History   Tobacco Use  . Smoking status: Never Smoker  . Smokeless tobacco: Never Used  Substance Use Topics  . Alcohol use: No    Alcohol/week: 0.0 standard drinks  . Drug use: No     Allergies   Buprenorphine hcl and Morphine and related   Review of Systems Review of Systems  Constitutional: Negative for chills and fever.  Musculoskeletal: Positive for arthralgias, gait problem and myalgias.  Skin: Negative for color change and wound.  Neurological: Negative for weakness and numbness.     Physical Exam Updated Vital Signs BP (!) 125/53 (BP Location: Right Arm)   Pulse 91   Temp 98.6 F (37 C) (Oral)   Resp 18   Ht 5\' 7"  (1.702 m)   Wt 133.4 kg   SpO2 99%   BMI 46.05 kg/m   Physical Exam  Constitutional: She  appears well-developed and well-nourished. No distress.  Sitting comfortably in bed.  HENT:  Head: Normocephalic and atraumatic.  Eyes: Conjunctivae are normal. Right eye exhibits no discharge. Left eye exhibits no discharge.  EOMs normal to gross examination.  Neck: Normal range of motion.  Cardiovascular: Normal rate and regular rhythm.  Intact, 2+ DP pulses.  Pulmonary/Chest:  Normal respiratory effort. Patient converses comfortably. No audible wheeze or stridor.  Abdominal: She exhibits no distension.  Musculoskeletal:  Left knee with tenderness to palpation of superior, medial, and lateral knee. Full passive ROM. Decreased active ROM 2/2 pain. No joint line tenderness. No joint effusion or swelling appreciated. No abnormal alignment or patellar mobility. No bruising, erythema or warmth overlaying the joint. No varus/valgus laxity. Negative drawer's, Lachman's and McMurray's.  No crepitus.  2+ DP pulses bilaterally. All compartments are soft. Sensation intact distal to injury.  Neurological: She is alert.  Cranial nerves intact to gross observation. Patient moves extremities without difficulty.  Skin: Skin is warm and dry. She is not diaphoretic.  Psychiatric: She has a normal mood and affect. Her behavior is normal. Judgment and thought content normal.  Nursing note and vitals reviewed.    ED Treatments / Results  Labs (all labs ordered are listed, but only abnormal results are displayed) Labs Reviewed - No data to display  EKG None  Radiology Dg Knee Complete 4 Views Left  Result Date: 04/11/2018 CLINICAL DATA:  Left knee pain and swelling.  No known injury. EXAM: LEFT KNEE - COMPLETE 4+ VIEW COMPARISON:  None. FINDINGS: No fracture or dislocation. Moderate tricompartmental osteoarthritis with peripheral spurring. Mild medial tibiofemoral joint space narrowing. No bony destructive change. No significant joint effusion. No focal soft tissue abnormality. IMPRESSION: Moderate  tricompartmental osteoarthritis without acute osseous abnormality. Electronically Signed   By: Narda Rutherford M.D.   On: 04/11/2018 23:04    Procedures Procedures (including critical care time)  Medications Ordered in ED Medications  ketorolac (TORADOL) 15 MG/ML injection 15 mg (15 mg Intramuscular Given 04/12/18 0131)  acetaminophen (TYLENOL) tablet 1,000 mg (1,000 mg Oral Given 04/12/18 0131)  Initial Impression / Assessment and Plan / ED Course  I have reviewed the triage vital signs and the nursing notes.  Pertinent labs & imaging results that were available during my care of the patient were reviewed by me and considered in my medical decision making (see chart for details).     Patient nontoxic-appearing, afebrile, and in no acute distress.  Patient with knee without appreciable effusion, no erythema.  Patient has had no systemic symptoms.  Patient has had gradual onset of her symptoms, therefore doubt acute hemarthrosis.  Will attempt to achieve pain control and ambulate. No evidence of septic joint.  Examination not consistent with DVT.  2:52 AM Patient ambulated with assistance.  She reports she now has range of motion with flexion extension of her knee.  She reports she usually uses a cane or walker at home, and we discussed using these modalities while recovering from this acute exacerbation.  Patient also has an orthopedist, and I recommended calling tomorrow to make appointment.  Return precautions were given for any erythema, increasing pain, or distal weakness or numbness.  Patient is in understanding and agrees with plan of care.  Final Clinical Impressions(s) / ED Diagnoses   Final diagnoses:  Acute pain of left knee    ED Discharge Orders         Ordered    diclofenac sodium (VOLTAREN) 1 % GEL  4 times daily     04/12/18 0255           Elisha Ponder, PA-C 04/12/18 0257    Nira Conn, MD 04/12/18 [phone]

## 2018-04-17 ENCOUNTER — Other Ambulatory Visit: Payer: Self-pay

## 2018-04-17 ENCOUNTER — Encounter (INDEPENDENT_AMBULATORY_CARE_PROVIDER_SITE_OTHER): Payer: Self-pay | Admitting: Orthopedic Surgery

## 2018-04-17 ENCOUNTER — Ambulatory Visit (INDEPENDENT_AMBULATORY_CARE_PROVIDER_SITE_OTHER): Payer: Medicaid Other | Admitting: Orthopedic Surgery

## 2018-04-17 ENCOUNTER — Ambulatory Visit: Payer: Medicaid Other | Admitting: Family Medicine

## 2018-04-17 ENCOUNTER — Encounter: Payer: Self-pay | Admitting: Family Medicine

## 2018-04-17 VITALS — BP 125/82 | HR 99 | Temp 98.1°F | Wt 290.0 lb

## 2018-04-17 DIAGNOSIS — R42 Dizziness and giddiness: Secondary | ICD-10-CM

## 2018-04-17 DIAGNOSIS — M1712 Unilateral primary osteoarthritis, left knee: Secondary | ICD-10-CM

## 2018-04-17 DIAGNOSIS — M1711 Unilateral primary osteoarthritis, right knee: Secondary | ICD-10-CM

## 2018-04-17 DIAGNOSIS — R7989 Other specified abnormal findings of blood chemistry: Secondary | ICD-10-CM

## 2018-04-17 DIAGNOSIS — R0602 Shortness of breath: Secondary | ICD-10-CM | POA: Diagnosis not present

## 2018-04-17 MED ORDER — LIDOCAINE HCL 1 % IJ SOLN
5.0000 mL | INTRAMUSCULAR | Status: AC | PRN
  Administered 2018-04-17: 5 mL

## 2018-04-17 MED ORDER — BUPIVACAINE HCL 0.25 % IJ SOLN
4.0000 mL | INTRAMUSCULAR | Status: AC | PRN
  Administered 2018-04-17: 4 mL via INTRA_ARTICULAR

## 2018-04-17 MED ORDER — DICLOFENAC SODIUM 1 % TD GEL: 2.0000 g | Freq: Three times a day (TID) | TRANSDERMAL | 1 refills | Status: DC | PRN

## 2018-04-17 MED ORDER — METHYLPREDNISOLONE ACETATE 40 MG/ML IJ SUSP
40.0000 mg | INTRAMUSCULAR | Status: AC | PRN
  Administered 2018-04-17: 40 mg via INTRA_ARTICULAR

## 2018-04-17 NOTE — Progress Notes (Deleted)
SUBJECTIVE:  PCP: Freddrick March, MD Patient ID: MRN [id number]  Date of birth: 08/12/1960  HPI Yesenia [name] is a 57 y.o. female who presents to clinic with chief complaint of ***. Other complaints today include ***.   ***  Review of Symptoms: See HPI  HISTORY Medications & Allergies: Reviewed with patient and updated in EMR as appropriate.   Past Medical & Surgical History:  Patient Active Problem List   Diagnosis Date Noted  . Dizziness 01/10/2018  . Swelling of right ring finger 12/06/2017  . Tremor 10/25/2017  . Dysphagia 08/16/2017  . Nausea and vomiting   . Abdominal pain, epigastric   . MDD (major depressive disorder), recurrent severe, without psychosis (HCC)   . AKI (acute kidney injury) (HCC) 08/15/2017  . Sleep apnea 03/20/2017  . Acute on chronic respiratory failure with hypoxemia (HCC) 09/30/2016  . Acute tracheitis 09/30/2016  . Abscess   . Complication of tracheostomy (HCC) 06/23/2016  . Bleeding 06/23/2016  . Iron deficiency anemia 05/26/2016  . 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 Irritable bowel syndrome 05/21/2016  . Anxiety state 05/21/2016  . Tracheostomy dependent (HCC)   . Generalized weakness 04/13/2016  . Weakness generalized 04/13/2016  . HCAP (healthcare-associated pneumonia) 03/06/2016  . Fibromyalgia 02/22/2016  . Cellulitis 02/21/2016  . Abdominal pain 02/16/2016  . Constipation 02/16/2016  . CKD (chronic kidney disease) stage 3, GFR 30-59 ml/min (HCC) 02/16/2016  . Fever   . Cough   . OSA (obstructive sleep apnea) 02/13/2016  . Insomnia 08/10/2015  . Polyneuropathy 07/10/2015  . GERD (gastroesophageal reflux disease) 07/10/2015  . Acute kidney injury superimposed on chronic kidney disease (HCC) 07/07/2015  . Gait disturbance 07/07/2015  . Morbid (severe) obesity due to excess calories (HCC) complicated by OSA 07/07/2015  . Left hip pain 07/06/2015  . Chronic pain   . Right sided weakness   . Headache(784.0) 04/14/2013  .  Encephalopathy acute 04/11/2013  . Type 2 diabetes mellitus with stage 3 chronic kidney disease, without long-term current use of insulin (HCC) 04/11/2013  . Dyslipidemia 04/11/2013  . Tachycardia 02/15/2013  . Chest pain 02/15/2013  . Shortness of breath 02/15/2013  . Cerebral infarction (HCC) 01/14/2013  . Essential hypertension 01/14/2013  . Anemia 01/14/2013   Past Surgical History:  Procedure Laterality Date  . ESOPHAGOGASTRODUODENOSCOPY (EGD) WITH PROPOFOL N/A 08/16/2017   Procedure: ESOPHAGOGASTRODUODENOSCOPY (EGD) WITH PROPOFOL;  Surgeon: Benancio Deeds, MD;  Location: [facility] ENDOSCOPY;  Service: Gastroenterology;  Laterality: N/A;  . KNEE ARTHROPLASTY  1997   "stuck rods in it"  . KNEE ARTHROSCOPY Right 1992, 1995,  . OOPHORECTOMY Right 1993?  . TRACHEOSTOMY TUBE PLACEMENT N/A 02/13/2016   Procedure: TRACHEOSTOMY;  Surgeon: Christia Reading, MD;  Location: [facility] OR;  Service: ENT;  Laterality: N/A;  . TUBAL LIGATION      Family History:  family history includes Breast cancer in her maternal grandmother and mother; Leukemia in her brother. There is no history of Colon cancer, Esophageal cancer, Rectal cancer, or Stomach cancer.  Social History:   reports that she has never smoked. She has never used smokeless tobacco. She reports that she does not drink alcohol or use drugs.  OBJECTIVE:  BP 125/82 (BP Location: Left Arm)   Pulse 99   Temp 98.1 F (36.7 C) (Oral)   Wt 290 lb (131.5 kg)   SpO2 91%   BMI 45.42 kg/m   Physical Exam:  Gen: NAD, alert, non-toxic, well-appearing, sitting comfortably, obese Skin: Warm and dry. No obvious rashes, lesions,  or trauma. HEENT: NCAT No conjunctival pallor or injection. No scleral icterus or injection.  MMM.  CV: RRR.  Normal S1-S2.  RP & DPs 2+ bilaterally. No BLEE. Resp: CTAB.  No wheezing, rales, abnormal lung sounds.  No increased WOB Abd: NTND on palpation to all 4 quadrants.  Positive bowel sounds. Psych: Cooperative with exam.  Pleasant. Makes eye contact. Speech normal. Extremities: Moves all extremities spontaneously  Neuro: CN II-XII grossly intact. No FNDs.    Pertinent Labs & Imaging:   Reviewed in chart   Ref. Range 02/16/2018 12:06  TSH Latest Ref Range: 0.450 - 4.500 uIU/mL <0.006 (L)    ASSESSMENT & PLAN:  Low serum thyroid stimulating hormone (TSH) Can be causing many of the patient's symptoms that she has been complaining of for the last month.  Including, jitteriness, anxiety, shortness of breath, difficulty sleeping, weight loss.  Follow-up free T3, free T4, repeat TSH  Patient to return on Thursday for follow-up.  Patient will be placed in access to care.  Dizziness Possibly due to orthostatic hypotension.  Less likely vertigo as patient does not experience feelings of spinning room, or positional changes causing vertigo.  Unable to perform Dix-Hallpike maneuver as patient had pain with lying down.  Could also be from thyroid hormone abnormalities.  Will monitor for resolution thyroid hormone work-up and treatment.  Shortness of breath Has been occurring for about the past month.  Unlikely to be primary respiratory disorder as patient does not have any changes in chest x-ray or significant physical exam findings.  More likely due to anxiety secondary to a thyrotoxicosis.  Must rule out heart failure, however, less likely as patient denies orthopnea, leg swelling.   T3, T4, TSH labs  If these labs are negative, and shortness of breath does not resolve patient will need echo   Health Maintenance Due  Topic Date Due  . Hepatitis C Screening  [birth year]  . FOOT EXAM  08/12/1971  . OPHTHALMOLOGY EXAM  08/12/1971  . URINE MICROALBUMIN  08/12/1971  . TETANUS/TDAP  08/11/1980  . PAP SMEAR  08/11/1982  . INFLUENZA VACCINE  02/09/2018     Genia Hotter, M.D. [facility]  PGY -1 04/17/2018, 11:12 AM

## 2018-04-17 NOTE — Assessment & Plan Note (Addendum)
Can be causing many of the patient's symptoms that she has been complaining of for the last month.  Including, jitteriness, anxiety, shortness of breath, difficulty sleeping, weight loss.  Follow-up free T3, free T4, repeat TSH  Patient to return on Thursday for follow-up.  Patient will be placed in access to care.

## 2018-04-17 NOTE — Patient Instructions (Signed)
Dear Yesenia [name],   It was nice to see you today! I am glad you came in for your concerns. This document serves as a "wrap-up" to all that we discussed today and is listed as follows:    Today, we are checking up on your thyroid hormone levels.  Previously, they were very high and could be losing all of the symptoms including your increased anxiety, weight loss, shortness of breath.   I will call you with the results tomorrow.  I would like you to follow-up within the week, so make an appointment on your way out for a Wednesday or Thursday.  Thank you for choosing [facility] for your primary care needs and stay well!   Best,   Dr. Genia Hotter Resident Physician, PGY-1 [facility] [phone]    Don't forget to sign up for MyChart for instant access to your health profile, labs, orders, upcoming appointments or to contact your provider with questions. Stop at the front desk on the way out for more information about how to sign up!   Hyperthyroidism Hyperthyroidism is when the thyroid is too active (overactive). Your thyroid is a large gland that is located in your neck. The thyroid helps to control how your body uses food (metabolism). When your thyroid is overactive, it produces too much of a hormone called thyroxine. What are the causes? Causes of hyperthyroidism may include:  Graves disease. This is when your immune system attacks the thyroid gland. This is the most common cause.  Inflammation of the thyroid gland.  Tumor in the thyroid gland or somewhere else.  Excessive use of thyroid medicines, including: ? Prescription thyroid supplement. ? Herbal supplements that mimic thyroid hormones.  Solid or fluid-filled lumps within your thyroid gland (thyroid nodules).  Excessive ingestion of iodine.  What increases the risk?  Being female.  Having a family history of thyroid conditions. What are the signs or symptoms? Signs and symptoms of  hyperthyroidism may include:  Nervousness.  Inability to tolerate heat.  Unexplained weight loss.  Diarrhea.  Change in the texture of hair or skin.  Heart skipping beats or making extra beats.  Rapid heart rate.  Loss of menstruation.  Shaky hands.  Fatigue.  Restlessness.  Increased appetite.  Sleep problems.  Enlarged thyroid gland or nodules.  How is this diagnosed? Diagnosis of hyperthyroidism may include:  Medical history and physical exam.  Blood tests.  Ultrasound tests.  How is this treated? Treatment may include:  Medicines to control your thyroid.  Surgery to remove your thyroid.  Radiation therapy.  Follow these instructions at home:  Take medicines only as directed by your health care provider.  Do not use any tobacco products, including cigarettes, chewing tobacco, or electronic cigarettes. If you need help quitting, ask your health care provider.  Do not exercise or do physical activity until your health care provider approves.  Keep all follow-up appointments as directed by your health care provider. This is important. Contact a health care provider if:  Your symptoms do not get better with treatment.  You have fever.  You are taking thyroid replacement medicine and you: ? Have depression. ? Feel mentally and physically slow. ? Have weight gain. Get help right away if:  You have decreased alertness or a change in your awareness.  You have abdominal pain.  You feel dizzy.  You have a rapid heartbeat.  You have an irregular heartbeat. This information is not intended to replace advice given to  you by your health care provider. Make sure you discuss any questions you have with your health care provider. Document Released: 06/28/2005 Document Revised: 11/27/2015 Document Reviewed: 11/13/2013 Elsevier Interactive Patient Education  2018 [employer].

## 2018-04-17 NOTE — Progress Notes (Signed)
SUBJECTIVE:  PCP: Freddrick March, MD Patient ID: MRN [id number]  Date of birth: Mar 07, 1961  HPI Yesenia [name] is a 57 y.o. female who presents to clinic with chief complaint of nervousness, shortness of breath. Other complaints today include dizziness.   Nervousness, jitteriness, dizziness, shortness of breath Started about one month ago.  Patient has noticed that she becomes short of breath when walking from bathroom to couch or other short distances as well as when she is sitting still. She also finds it harder to speak in full sentences after this time.  She reports that she has been very tremulous and feeling nervous all the time.  She also complains of a 58 pound weight loss without trying to lose weight since about March.  She also reports that she is constantly nauseous, requiring once daily Zofran doses about 3-4 times per week.  She reports vomiting about 1-2 times per week and increased frequency of bowel movements.  She reports going through many toilet paper rolls in the past several weeks which she reports is likely from her IBS.  She has had severe difficulty sleeping and uses Vistaril 25 mg, trazodone 50 mg , Remeron 15 mg all at bedtime and feels that it is still hard to fall asleep. She denies chest pain, headaches, history of asthma or COPD.  She denies wheezing, orthopnea, swelling in her legs.  Her history is significant for severe depression, acute respiratory failure with trach.    Review of Symptoms: See HPI  HISTORY Medications & Allergies: Reviewed with patient and updated in EMR as appropriate.   Past Medical & Surgical History:  Patient Active Problem List   Diagnosis Date Noted  . Dizziness 01/10/2018  . Swelling of right ring finger 12/06/2017  . Tremor 10/25/2017  . Dysphagia 08/16/2017  . Nausea and vomiting   . Abdominal pain, epigastric   . MDD (major depressive disorder), recurrent severe, without psychosis (HCC)   . AKI (acute kidney injury) (HCC) 08/15/2017   . Sleep apnea 03/20/2017  . Acute on chronic respiratory failure with hypoxemia (HCC) 09/30/2016  . Acute tracheitis 09/30/2016  . Abscess   . Complication of tracheostomy (HCC) 06/23/2016  . Bleeding 06/23/2016  . Iron deficiency anemia 05/26/2016  . 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 Irritable bowel syndrome 05/21/2016  . Anxiety state 05/21/2016  . Tracheostomy dependent (HCC)   . Generalized weakness 04/13/2016  . Weakness generalized 04/13/2016  . HCAP (healthcare-associated pneumonia) 03/06/2016  . Fibromyalgia 02/22/2016  . Cellulitis 02/21/2016  . Abdominal pain 02/16/2016  . Constipation 02/16/2016  . CKD (chronic kidney disease) stage 3, GFR 30-59 ml/min (HCC) 02/16/2016  . Fever   . Cough   . OSA (obstructive sleep apnea) 02/13/2016  . Insomnia 08/10/2015  . Polyneuropathy 07/10/2015  . GERD (gastroesophageal reflux disease) 07/10/2015  . Acute kidney injury superimposed on chronic kidney disease (HCC) 07/07/2015  . Gait disturbance 07/07/2015  . Morbid (severe) obesity due to excess calories (HCC) complicated by OSA 07/07/2015  . Left hip pain 07/06/2015  . Chronic pain   . Right sided weakness   . Headache(784.0) 04/14/2013  . Encephalopathy acute 04/11/2013  . Type 2 diabetes mellitus with stage 3 chronic kidney disease, without long-term current use of insulin (HCC) 04/11/2013  . Dyslipidemia 04/11/2013  . Tachycardia 02/15/2013  . Chest pain 02/15/2013  . Shortness of breath 02/15/2013  . Cerebral infarction (HCC) 01/14/2013  . Essential hypertension 01/14/2013  . Anemia 01/14/2013   Past Surgical History:  Procedure Laterality  Date  . ESOPHAGOGASTRODUODENOSCOPY (EGD) WITH PROPOFOL N/A 08/16/2017   Procedure: ESOPHAGOGASTRODUODENOSCOPY (EGD) WITH PROPOFOL;  Surgeon: Benancio Deeds, MD;  Location: [facility] ENDOSCOPY;  Service: Gastroenterology;  Laterality: N/A;  . KNEE ARTHROPLASTY  1997   "stuck rods in it"  . KNEE ARTHROSCOPY Right 1992, 1995,    . OOPHORECTOMY Right 1993?  . TRACHEOSTOMY TUBE PLACEMENT N/A 02/13/2016   Procedure: TRACHEOSTOMY;  Surgeon: Christia Reading, MD;  Location: [facility] OR;  Service: ENT;  Laterality: N/A;  . TUBAL LIGATION      Family History:  family history includes Breast cancer in her maternal grandmother and mother; Leukemia in her brother. There is no history of Colon cancer, Esophageal cancer, Rectal cancer, or Stomach cancer.  Her mother passed away from breast cancer and brain cancer.  Her brother passed away from leukemia.  Her father has also passed away.  She has 1 daughter who is healthy  Social History:   reports that she has never smoked. She has never used smokeless tobacco. She reports that she does not drink alcohol or use drugs.    OBJECTIVE:  BP 125/82 (BP Location: Left Arm)   Pulse 99   Temp 98.1 F (36.7 C) (Oral)   Wt 290 lb (131.5 kg)   SpO2 91%   BMI 45.42 kg/m   Physical Exam:  Gen: Alert, non-toxic, well-appearing, tremulous, fidgeting, is unable to finish sentences without taking an extra breath.  Patient has difficulty getting up on exam table.  When lying back, she experiences back pain and needs to immediately sit back up.  She becomes more tremulous and gasps for breath after this episode. Skin: Warm and dry. No obvious rashes, lesions, or trauma. HEENT: NCAT No conjunctival pallor or injection. No scleral icterus or injection.  MMM.  Neck: No nodules, tenderness.  Well-healed scar from trach. CV: RRR.  Normal S1-S2.  RP & DPs 2+ bilaterally.  Mild BLEE.  2+/6 systolic ejection murmur. Resp: CTAB.  No wheezing, rales, or rhonchi.  Increased respirations, mild supra clavicular accessory muscle use. Abd: NTND on palpation to all 4 quadrants.  Positive bowel sounds. Psych: Cooperative with exam. Pleasant. Makes eye contact. Speech normal. Extremities: Moves all extremities spontaneously  Neuro: CN II-XII grossly intact. No FNDs.    Pertinent Labs & Imaging:   Reviewed in  chart   ASSESSMENT & PLAN:  Low serum thyroid stimulating hormone (TSH) Can be causing many of the patient's symptoms that she has been complaining of for the last month.  Including, jitteriness, anxiety, shortness of breath, difficulty sleeping, weight loss.  Follow-up free T3, free T4, repeat TSH  Patient to return on Thursday for follow-up.  Patient will be placed in access to care.  Dizziness Possibly due to orthostatic hypotension.  Less likely vertigo as patient does not experience feelings of spinning room, or positional changes causing vertigo.  Unable to perform Dix-Hallpike maneuver as patient had pain with lying down.  Could also be from thyroid hormone abnormalities.  Will monitor for resolution thyroid hormone work-up and treatment.  Shortness of breath Has been occurring for about the past month.  Unlikely to be primary respiratory disorder as patient does not have any changes in chest x-ray or significant physical exam findings.  More likely due to anxiety secondary to a thyrotoxicosis.  Must rule out heart failure, however, less likely as patient denies orthopnea, leg swelling.   T3, T4, TSH labs  If these labs are negative, and shortness of breath does not resolve  patient will need echo  Health Maintenance Due  Topic Date Due  . Hepatitis C Screening  Nov 09, 1960  . FOOT EXAM  08/12/1971  . OPHTHALMOLOGY EXAM  08/12/1971  . URINE MICROALBUMIN  08/12/1971  . TETANUS/TDAP  08/11/1980  . PAP SMEAR  08/11/1982  . INFLUENZA VACCINE  02/09/2018     Genia Hotter, M.D. [facility]  PGY -1 04/17/2018, 11:27 AM

## 2018-04-17 NOTE — Progress Notes (Signed)
Office Visit Note   Patient: Yesenia [name]           Date of Birth: Mar 18, 1961           MRN: [id number] Visit Date: 04/17/2018 Requested by: Freddrick March, MD [address] [place], [place] [address] PCP: Freddrick March, MD  Subjective: Chief Complaint  Patient presents with  . Right Leg - Pain    HPI: Patient presents with bilateral knee pain.  She reports primarily lateral sided knee pain on the right-hand side for the last 2 weeks.  She actually went to the emergency room for the left knee earlier this week.  States that the pain is constant.  She has trouble walking.  She describes weakness and giving way and popping.  Radiographs on the 10 system do demonstrate relatively severe tricompartmental arthritis.  She does have a history of arthroscopy on that right knee in the 90s.  She currently is not working.              ROS: All systems reviewed are negative as they relate to the chief complaint within the history of present illness.  Patient denies  fevers or chills.   Assessment & Plan: Visit Diagnoses:  1. Unilateral primary osteoarthritis, left knee   2. Unilateral primary osteoarthritis, right knee     Plan: Impression is right knee pain left knee pain with tricompartment arthritis present.  Plan is bilateral knee injections today.  She needs to lose weight before considering knee replacement surgery.  I will see her back as needed.  We did call in a prescription for Voltaren gel if that will be covered with her insurance..  Follow-Up Instructions: Return if symptoms worsen or fail to improve.   Orders:  No orders of the defined types were placed in this encounter.  Meds ordered this encounter  Medications  . diclofenac sodium (VOLTAREN) 1 % GEL    Sig: Apply 2 g topically 3 (three) times daily as needed.    Dispense:  1 Tube    Refill:  1      Procedures: Large Joint Inj: bilateral knee on 04/17/2018 4:59 PM Indications: diagnostic evaluation, joint swelling and  pain Details: 18 G 1.5 in needle, superolateral approach  Arthrogram: No  Medications (Right): 5 mL lidocaine 1 %; 4 mL bupivacaine 0.25 %; 40 mg methylPREDNISolone acetate 40 MG/ML Medications (Left): 5 mL lidocaine 1 %; 4 mL bupivacaine 0.25 %; 40 mg methylPREDNISolone acetate 40 MG/ML Outcome: tolerated well, no immediate complications Procedure, treatment alternatives, risks and benefits explained, specific risks discussed. Consent was given by the patient. Immediately prior to procedure a time out was called to verify the correct patient, procedure, equipment, support staff and site/side marked as required. Patient was prepped and draped in the usual sterile fashion.       Clinical Data: No additional findings.  Objective: Vital Signs: There were no vitals taken for this visit.  Physical Exam:   Constitutional: Patient appears well-developed HEENT:  Head: Normocephalic Eyes:EOM are normal Neck: Normal range of motion Cardiovascular: Normal rate Pulmonary/chest: Effort normal Neurologic: Patient is alert Skin: Skin is warm Psychiatric: Patient has normal mood and affect    Ortho Exam: Ortho exam demonstrates increased body mass index with palpable pedal pulses.  Range of motion is full extension to just past 90 degrees in both knees.  Prior surgical incisions noted on the right knee.  No effusion in either knee.  Patellofemoral crepitus is present but collateral crucial ligaments  are stable.  Not much in the way of groin pain with internal/external rotation of the right or left leg today but she was  A little anxious today in general. Specialty Comments:  No specialty comments available.  Imaging: No results found.   PMFS History: Patient Active Problem List   Diagnosis Date Noted  . Low serum thyroid stimulating hormone (TSH) 04/17/2018  . Dizziness 01/10/2018  . Swelling of right ring finger 12/06/2017  . Tremor 10/25/2017  . Dysphagia 08/16/2017  . Nausea  and vomiting   . Abdominal pain, epigastric   . MDD (major depressive disorder), recurrent severe, without psychosis (HCC)   . AKI (acute kidney injury) (HCC) 08/15/2017  . Sleep apnea 03/20/2017  . Acute on chronic respiratory failure with hypoxemia (HCC) 09/30/2016  . Acute tracheitis 09/30/2016  . Abscess   . Complication of tracheostomy (HCC) 06/23/2016  . Bleeding 06/23/2016  . Iron deficiency anemia 05/26/2016  . 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 Irritable bowel syndrome 05/21/2016  . Anxiety state 05/21/2016  . Tracheostomy dependent (HCC)   . Generalized weakness 04/13/2016  . Weakness generalized 04/13/2016  . HCAP (healthcare-associated pneumonia) 03/06/2016  . Fibromyalgia 02/22/2016  . Cellulitis 02/21/2016  . Abdominal pain 02/16/2016  . Constipation 02/16/2016  . CKD (chronic kidney disease) stage 3, GFR 30-59 ml/min (HCC) 02/16/2016  . Fever   . Cough   . OSA (obstructive sleep apnea) 02/13/2016  . Insomnia 08/10/2015  . Polyneuropathy 07/10/2015  . GERD (gastroesophageal reflux disease) 07/10/2015  . Acute kidney injury superimposed on chronic kidney disease (HCC) 07/07/2015  . Gait disturbance 07/07/2015  . Morbid (severe) obesity due to excess calories (HCC) complicated by OSA 07/07/2015  . Left hip pain 07/06/2015  . Chronic pain   . Right sided weakness   . Headache(784.0) 04/14/2013  . Encephalopathy acute 04/11/2013  . Type 2 diabetes mellitus with stage 3 chronic kidney disease, without long-term current use of insulin (HCC) 04/11/2013  . Dyslipidemia 04/11/2013  . Tachycardia 02/15/2013  . Chest pain 02/15/2013  . Shortness of breath 02/15/2013  . Cerebral infarction (HCC) 01/14/2013  . Essential hypertension 01/14/2013  . Anemia 01/14/2013   Past Medical History:  Diagnosis Date  . Acute stomach ulcer 08/15/2017  . AKI (acute kidney injury) (HCC)    Acute kidney injury on CKD stage II-III/notes 08/17/2017  . Anemia   . Anemia   .  Arthritis    "knees" (08/17/2017)  . Blood transfusion without reported diagnosis 2012   anemia  . Chronic pain   . CKD (chronic kidney disease)    Stage 3  . Depression   . Fibromyalgia    hospitilized 12/16 due to inability to walk  . Gastric ulcer   . History of gastritis 2019  . Hyperlipemia   . Hypertension   . Insomnia   . Neuropathy   . Right sided weakness   . Sleep apnea    "CPAPs didn't work for me; that's why they did OR" (08/17/2017)  . Stroke [facility]) 2014?   no residual weakness   . Type II diabetes mellitus (HCC)     does not check CBG's (08/17/2017), no meds  . Vitamin deficiency    Vit D    Family History  Problem Relation Age of Onset  . Breast cancer Mother   . Leukemia Brother   . Breast cancer Maternal Grandmother   . Colon cancer Neg Hx   . Esophageal cancer Neg Hx   . Rectal cancer Neg  Hx   . Stomach cancer Neg Hx     Past Surgical History:  Procedure Laterality Date  . ESOPHAGOGASTRODUODENOSCOPY (EGD) WITH PROPOFOL N/A 08/16/2017   Procedure: ESOPHAGOGASTRODUODENOSCOPY (EGD) WITH PROPOFOL;  Surgeon: Benancio Deeds, MD;  Location: [facility] ENDOSCOPY;  Service: Gastroenterology;  Laterality: N/A;  . KNEE ARTHROPLASTY  1997   "stuck rods in it"  . KNEE ARTHROSCOPY Right 1992, 1995,  . OOPHORECTOMY Right 1993?  . TRACHEOSTOMY TUBE PLACEMENT N/A 02/13/2016   Procedure: TRACHEOSTOMY;  Surgeon: Christia Reading, MD;  Location: [facility] OR;  Service: ENT;  Laterality: N/A;  . TUBAL LIGATION     Social History   Occupational History  . Occupation: [employer]  Tobacco Use  . Smoking status: Never Smoker  . Smokeless tobacco: Never Used  Substance and Sexual Activity  . Alcohol use: No    Alcohol/week: 0.0 standard drinks  . Drug use: No  . Sexual activity: Never

## 2018-04-17 NOTE — Assessment & Plan Note (Signed)
Has been occurring for about the past month.  Unlikely to be primary respiratory disorder as patient does not have any changes in chest x-ray or significant physical exam findings.  More likely due to anxiety secondary to a thyrotoxicosis.  Must rule out heart failure, however, less likely as patient denies orthopnea, leg swelling.   T3, T4, TSH labs  If these labs are negative, and shortness of breath does not resolve patient will need echo

## 2018-04-17 NOTE — Assessment & Plan Note (Signed)
Possibly due to orthostatic hypotension.  Less likely vertigo as patient does not experience feelings of spinning room, or positional changes causing vertigo.  Unable to perform Dix-Hallpike maneuver as patient had pain with lying down.  Could also be from thyroid hormone abnormalities.  Will monitor for resolution thyroid hormone work-up and treatment.

## 2018-04-18 ENCOUNTER — Other Ambulatory Visit: Payer: Self-pay

## 2018-04-18 LAB — T3, FREE: T3 FREE: 12.7 pg/mL — AB (ref 2.0–4.4)

## 2018-04-18 LAB — TSH

## 2018-04-18 LAB — T4, FREE: FREE T4: 2.88 ng/dL — AB (ref 0.82–1.77)

## 2018-04-18 MED ORDER — TRAZODONE HCL 50 MG PO TABS: 50.0000 mg | ORAL_TABLET | Freq: Every day | ORAL | 0 refills | Status: DC

## 2018-04-18 NOTE — Telephone Encounter (Signed)
Patient states that at OV yesterday a refill for Trazadone was to be sent to [facility].  Call back is [phone]  Ples Specter, RN [facility] RN)

## 2018-04-19 NOTE — Addendum Note (Signed)
Addended by: Genia Hotter E on: 04/19/2018 03:42 PM   Modules accepted: Orders

## 2018-04-20 ENCOUNTER — Telehealth: Payer: Self-pay | Admitting: *Deleted

## 2018-04-20 ENCOUNTER — Encounter: Payer: Self-pay | Admitting: Family Medicine

## 2018-04-20 ENCOUNTER — Ambulatory Visit: Payer: Medicaid Other | Admitting: Family Medicine

## 2018-04-20 ENCOUNTER — Other Ambulatory Visit: Payer: Self-pay

## 2018-04-20 VITALS — BP 134/72 | HR 86 | Temp 98.1°F | Ht 67.0 in | Wt 280.2 lb

## 2018-04-20 DIAGNOSIS — E0590 Thyrotoxicosis, unspecified without thyrotoxic crisis or storm: Secondary | ICD-10-CM

## 2018-04-20 MED ORDER — CLONAZEPAM 0.5 MG PO TABS: 0.5000 mg | ORAL_TABLET | Freq: Three times a day (TID) | ORAL | 1 refills | Status: DC | PRN

## 2018-04-20 NOTE — Telephone Encounter (Signed)
Contacted pt to inform her of her scan it is on 10/17 and 10/18.  She is to arrive at 10:30am on 10/17 at [facility] and then she will return at 3:00pm for imaging.  And she is to return the next day 10/18 at  10:45am for the final part of the scan. Yesenia [name], [name], [place]

## 2018-04-20 NOTE — Patient Instructions (Addendum)
Dear Yesenia [name],   It was nice to see you today! I am glad you came in for your concerns. This document serves as a "wrap-up" to all that we discussed today and is listed as follows:    Hyperthryoidism  You will be scheduled for a thyroid procedure (Radioactive iodine therapy) that will help decrease the amount of thyroid hormone in your body.    I have sent over 0.5mg  clonazepam ("Klonipin") to the pharmacy to help you with your symptoms.   Take 1 tablet three times a day for symptoms of anxiety, high heart rate, trembling.  If 1 table does not work, you can try two, for a total of 1mg . You can call the office or ask your pharmacy to contact the doctor for a refill if needed.   Thank you for choosing [facility] for your primary care needs and stay well!   Best,   Dr. Genia Hotter Resident Physician, PGY-1 [facility] [phone]    Don't forget to sign up for MyChart for instant access to your health profile, labs, orders, upcoming appointments or to contact your provider with questions. Stop at the front desk on the way out for more information about how to sign up!   Radioiodine (I-131) Therapy for Hyperthyroidism Radioiodine (I-131) therapy is a procedure to treat an overactive thyroid gland (hyperthyroidism). The thyroid is a gland in the neck that uses iodine to help control how the body uses food (metabolism). In this procedure, you swallow a pill or liquid that contains I-131. I-131 is manufactured (synthetic) iodine that gives off radiation. This destroys thyroid cells and reverses hyperthyroidism. Tell a health care provider about:  Any allergies you have.  All medicines you are taking, including vitamins, herbs, eye drops, creams, and over-the-counter medicines.  Any problems you or family members have had with anesthetic medicines.  Any blood disorders you have.  Any surgeries you have had.  Any medical conditions you have.  Whether  you are pregnant, may be pregnant, or have gone through menopause, if this applies.  Whether you currently have children.  Whether you plan to have children in the next 2 years.  Any contact you have with children or pregnant women.  Your travel plans for the next 3 months.  Whether you pass through radiation detectors for work or travel. What are the risks? Generally, this is a safe procedure. However, problems may occur, including:  Damage to other structures or organs, such as the salivary glands. This could lead to dry mouth and loss of taste.  Low sperm count, if this applies. This may lead to temporary infertility.  Sore throat or neck pain. This is temporary.  Slightlyincreased risk of thyroid cancer.  Nausea or vomiting.  What happens before the procedure?  Ask your health care provider about changing or stopping your regular medicines. This is especially important if you are taking diabetes medicines, blood thinners, or thyroid medicines.  Women may be asked to take a pregnancy test.  Women who are breastfeeding should plan to stop at least 6 weeks before the procedure.  Follow instructions from your health care provider about eating or drinking restrictions.  Plan to avoid contact with others for 1 week after your treatment. It is most important to avoid contact with children and pregnant women. To do this, plan to stay home from work, arrange child care, and sleep alone, if these things apply to you.  Plan to drive yourself home after treatment. Do  not take public transportation. If you need someone to drive you home, sit as far away from the driver as possible. What happens during the procedure?  You will be given a dose of I-131 to swallow. It may be a pill or a liquid.  Your thyroid gland will absorb the I-131 over the next 3 months. The treatment process will be complete in about 6 months. What happens after the procedure?  You may need to stay in the  hospital for 24 hours after your treatment. This depends on the requirements in your state.  Follow instructions from your health care provider about: ? How to take care of yourself after the procedure. ? How to protect others from exposure to radiation as it leaves your body. This information is not intended to replace advice given to you by your health care provider. Make sure you discuss any questions you have with your health care provider. Document Released: 11/14/2008 Document Revised: 12/02/2015 Document Reviewed: 10/23/2014 Elsevier Interactive Patient Education  [employer].

## 2018-04-20 NOTE — Assessment & Plan Note (Signed)
Patient to be scheduled for her nuclear medicine RAI therapy soon as possible  Will call to RAI therapy later today to confirm requirements for therapy.  0.5 mg clonidine 3 times daily for anxiety symptoms  Can consider methimazole for anxiety symptoms if chemotherapy is extended

## 2018-04-20 NOTE — Telephone Encounter (Signed)
-----   Message from Melene Plan, MD sent at 04/19/2018  5:48 PM EDT ----- Patient will need a thyroid uptake scan.  Order has been put in as stat.   # Please make sure that patient has appointment for uptake scan.  # Patient can follow-up on 04/20/2018 with me at 9:30a as a double book as there are no other openings. Please make appointment slot for patient.   Thank you

## 2018-04-20 NOTE — Addendum Note (Signed)
Addended by: Genia Hotter E on: 04/20/2018 12:07 PM   Modules accepted: Orders

## 2018-04-20 NOTE — Progress Notes (Signed)
  SUBJECTIVE:  PCP: Freddrick March, MD Patient ID: MRN [id number]  Date of birth: 09-03-1960  HPI Yesenia [name] is a 57 y.o. female who presents as follow-up for hyperthyroidism.  Since being seen on the 7th, patient's TSH came back at 0.006, T3 12.7, T4 2.88.  She continues to have shortness of breath, tremulousness, heart palpitations, anxiety.  She reports no symptom improvement.  She does not want to feel like this anymore, as she has been feeling like this for the last several months.  She would like medication to help her symptoms.  Review of Symptoms: See HPI  HISTORY Medications & Allergies: Reviewed with patient and updated in EMR as appropriate.  Past Medical & Surgical History: Reviewed with patient and updated in EMR as appropriate.   Family History:  family history includes Breast cancer in her maternal grandmother and mother; Leukemia in her brother. There is no history of Colon cancer, Esophageal cancer, Rectal cancer, or Stomach cancer.  Social History:   reports that she has never smoked. She has never used smokeless tobacco. She reports that she does not drink alcohol or use drugs.  OBJECTIVE:  BP 134/72   Pulse 86   Temp 98.1 F (36.7 C) (Oral)   Ht 5\' 7"  (1.702 m)   Wt 280 lb 3.2 oz (127.1 kg)   SpO2 94%   BMI 43.89 kg/m   Physical Exam:  Gen: NAD, alert, non-toxic, is tremulous Skin: Warm and dry. No obvious rashes, lesions, or trauma. HEENT: NCAT No conjunctival pallor or injection. No scleral icterus or injection.  MMM.  CV: RRR.  Normal S1-S2.  RP & DPs 2+ bilaterally. No BLEE.  2/6 systolic ejection murmur Resp: CTAB.  No wheezing, rales, abnormal lung sounds.  No increased WOB Abd: NTND on palpation to all 4 quadrants.  Positive bowel sounds. Psych: Cooperative with exam. Pleasant. Makes eye contact. Speech normal. Extremities: Moves all extremities spontaneously  Neuro: CN II-XII grossly intact. No FNDs.    Pertinent Labs & Imaging:   Reviewed in  chart   ASSESSMENT & PLAN:  Hyperthyroidism Patient to be scheduled for her nuclear medicine RAI therapy soon as possible  Will call to RAI therapy later today to confirm requirements for therapy.  0.5 mg clonidine 3 times daily for anxiety symptoms  Can consider methimazole for anxiety symptoms if chemotherapy is extended   Genia Hotter, M.D. [facility]  PGY -1 04/20/2018, 8:47 AM

## 2018-04-27 ENCOUNTER — Telehealth: Payer: Self-pay

## 2018-04-27 ENCOUNTER — Ambulatory Visit (HOSPITAL_COMMUNITY)
Admission: RE | Admit: 2018-04-27 | Discharge: 2018-04-27 | Disposition: A | Discharge status: Home / Self Care | Payer: Medicaid Other | Source: Ambulatory Visit | Attending: Family Medicine | Admitting: Family Medicine

## 2018-04-27 ENCOUNTER — Encounter (HOSPITAL_COMMUNITY)
Admission: RE | Admit: 2018-04-27 | Discharge: 2018-04-27 | Disposition: A | Discharge status: Home / Self Care | Payer: Medicaid Other | Source: Ambulatory Visit | Attending: Family Medicine | Admitting: Family Medicine

## 2018-04-27 DIAGNOSIS — E0590 Thyrotoxicosis, unspecified without thyrotoxic crisis or storm: Secondary | ICD-10-CM

## 2018-04-27 MED ORDER — SODIUM IODIDE I-123 7.4 MBQ CAPS
446.0000 | ORAL_CAPSULE | Freq: Once | ORAL | Status: AC
  Administered 2018-04-27: 446 via ORAL

## 2018-04-27 MED ORDER — CLONAZEPAM 1 MG PO TABS: 1.0000 mg | ORAL_TABLET | Freq: Three times a day (TID) | ORAL | 0 refills | Status: DC | PRN

## 2018-04-27 NOTE — Telephone Encounter (Signed)
Patient left message requesting refill on Klonopin and an "increase to the next level" of dosage.  Call back is [phone]  Ples Specter, RN [facility] RN)

## 2018-04-28 ENCOUNTER — Ambulatory Visit (HOSPITAL_COMMUNITY)
Admission: RE | Admit: 2018-04-28 | Discharge: 2018-04-28 | Disposition: A | Discharge status: Home / Self Care | Payer: Medicaid Other | Source: Ambulatory Visit | Attending: Family Medicine | Admitting: Family Medicine

## 2018-05-02 ENCOUNTER — Ambulatory Visit (INDEPENDENT_AMBULATORY_CARE_PROVIDER_SITE_OTHER): Payer: Medicaid Other | Admitting: Physical Medicine and Rehabilitation

## 2018-05-02 DIAGNOSIS — R202 Paresthesia of skin: Secondary | ICD-10-CM

## 2018-05-02 NOTE — Progress Notes (Signed)
 .  Numeric Pain Rating Scale and Functional Assessment Average Pain 8   In the last MONTH (on 0-10 scale) has pain interfered with the following?  1. General activity like being  able to carry out your everyday physical activities such as walking, climbing stairs, carrying groceries, or moving a chair?  Rating(5)    

## 2018-05-03 ENCOUNTER — Telehealth: Payer: Self-pay | Admitting: Family Medicine

## 2018-05-03 NOTE — Telephone Encounter (Signed)
-----   Message from Freddrick March, MD sent at 05/02/2018  9:56 AM EDT ----- Elvina Sidle! You can inform her of the results and ask her to make a follow up appt.  I can place an endo referral for her at that time when I see her.  Thanks :)   ----- Message ----- From: Melene Plan, MD Sent: 05/02/2018   8:50 AM EDT To: Freddrick March, MD  Findings consistent with Graves disease.  Patient does not currently have follow up with endo or [facility]. I have not yet disclosed results to patient. Would you like me to follow up with this patient or would you like to continue care from here? Thank you!

## 2018-05-04 ENCOUNTER — Other Ambulatory Visit: Payer: Self-pay | Admitting: Internal Medicine

## 2018-05-04 DIAGNOSIS — Z1231 Encounter for screening mammogram for malignant neoplasm of breast: Secondary | ICD-10-CM

## 2018-05-04 NOTE — Telephone Encounter (Signed)
Pt is calling for results.  States that she is still not feeling well. Ellowyn Rieves, Maryjo Rochester, CMA

## 2018-05-05 ENCOUNTER — Other Ambulatory Visit: Payer: Self-pay | Admitting: Family Medicine

## 2018-05-05 DIAGNOSIS — E0500 Thyrotoxicosis with diffuse goiter without thyrotoxic crisis or storm: Secondary | ICD-10-CM

## 2018-05-05 DIAGNOSIS — E0590 Thyrotoxicosis, unspecified without thyrotoxic crisis or storm: Secondary | ICD-10-CM

## 2018-05-05 MED ORDER — METHIMAZOLE 10 MG PO TABS: 10.0000 mg | ORAL_TABLET | Freq: Every day | ORAL | 0 refills | Status: DC

## 2018-05-05 MED ORDER — ATENOLOL 100 MG PO TABS: 100.0000 mg | ORAL_TABLET | Freq: Every day | ORAL | 3 refills | Status: DC

## 2018-05-05 NOTE — Telephone Encounter (Signed)
Spoke to patient over the phone. See note for 05/05/2018

## 2018-05-05 NOTE — Progress Notes (Signed)
See encounter note for 05/05/2018

## 2018-05-05 NOTE — Progress Notes (Signed)
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 Antithyroid medication therapy, RAI therapy, vs Surgery. She is amenable to methimazole therapy for short term symptom control, but would prefer definitive therapy asap. We did not start methimazole sooner as patient was scheduled for uptake scan. Patient is already on atenolol daily 50mg .    * Will increase atenolol to 100mg .   * Patient's T4 is 1.5-2.0x upper limit normal, thus will start methimazole 10 mg. Can titrate up to 15 mg daily or 10 mg BID if patient still has significant symptoms (pt will call the office if she does not notice any change in symptoms).   * Patient will need to return for TSH, T4, T3 levels in one month.   Patient will need follow up with endocrinology (ordered as URGENT) to discuss options moving forward as patient prefers therapy that is out of scope of family medicine. If RAI: as patient has significant symptoms, older, with co-morbidities, it is likely that we will try to attempt to restore euthyroidism prior to radioiodine treatment.  If Surgery: patient will need to be evaluated by surgery.   Genia Hotter, M.D. 05/05/2018, 9:58 AM PGY-1, [facility]

## 2018-05-06 ENCOUNTER — Emergency Department (HOSPITAL_COMMUNITY): Payer: Medicaid Other

## 2018-05-06 ENCOUNTER — Observation Stay (HOSPITAL_COMMUNITY)
Admission: EM | Admit: 2018-05-06 | Discharge: 2018-05-09 | Disposition: A | Discharge status: Skilled Nursing Facility | Payer: Medicaid Other | Attending: Family Medicine | Admitting: Family Medicine

## 2018-05-06 ENCOUNTER — Encounter (HOSPITAL_COMMUNITY): Payer: Self-pay

## 2018-05-06 DIAGNOSIS — M5136 Other intervertebral disc degeneration, lumbar region: Secondary | ICD-10-CM | POA: Insufficient documentation

## 2018-05-06 DIAGNOSIS — E785 Hyperlipidemia, unspecified: Secondary | ICD-10-CM | POA: Insufficient documentation

## 2018-05-06 DIAGNOSIS — N183 Chronic kidney disease, stage 3 (moderate): Secondary | ICD-10-CM | POA: Insufficient documentation

## 2018-05-06 DIAGNOSIS — R52 Pain, unspecified: Secondary | ICD-10-CM | POA: Diagnosis present

## 2018-05-06 DIAGNOSIS — M25561 Pain in right knee: Secondary | ICD-10-CM | POA: Insufficient documentation

## 2018-05-06 DIAGNOSIS — Z8673 Personal history of transient ischemic attack (TIA), and cerebral infarction without residual deficits: Secondary | ICD-10-CM | POA: Insufficient documentation

## 2018-05-06 DIAGNOSIS — G8929 Other chronic pain: Secondary | ICD-10-CM | POA: Diagnosis not present

## 2018-05-06 DIAGNOSIS — M170 Bilateral primary osteoarthritis of knee: Secondary | ICD-10-CM | POA: Diagnosis not present

## 2018-05-06 DIAGNOSIS — M25552 Pain in left hip: Secondary | ICD-10-CM | POA: Diagnosis not present

## 2018-05-06 DIAGNOSIS — E1140 Type 2 diabetes mellitus with diabetic neuropathy, unspecified: Secondary | ICD-10-CM | POA: Insufficient documentation

## 2018-05-06 DIAGNOSIS — K589 Irritable bowel syndrome without diarrhea: Secondary | ICD-10-CM | POA: Insufficient documentation

## 2018-05-06 DIAGNOSIS — E0500 Thyrotoxicosis with diffuse goiter without thyrotoxic crisis or storm: Secondary | ICD-10-CM | POA: Diagnosis not present

## 2018-05-06 DIAGNOSIS — M5137 Other intervertebral disc degeneration, lumbosacral region: Secondary | ICD-10-CM | POA: Insufficient documentation

## 2018-05-06 DIAGNOSIS — Z888 Allergy status to other drugs, medicaments and biological substances status: Secondary | ICD-10-CM | POA: Diagnosis not present

## 2018-05-06 DIAGNOSIS — K219 Gastro-esophageal reflux disease without esophagitis: Secondary | ICD-10-CM | POA: Insufficient documentation

## 2018-05-06 DIAGNOSIS — M25569 Pain in unspecified knee: Secondary | ICD-10-CM

## 2018-05-06 DIAGNOSIS — F329 Major depressive disorder, single episode, unspecified: Secondary | ICD-10-CM | POA: Diagnosis not present

## 2018-05-06 DIAGNOSIS — R300 Dysuria: Secondary | ICD-10-CM | POA: Diagnosis not present

## 2018-05-06 DIAGNOSIS — M25562 Pain in left knee: Secondary | ICD-10-CM | POA: Insufficient documentation

## 2018-05-06 DIAGNOSIS — I129 Hypertensive chronic kidney disease with stage 1 through stage 4 chronic kidney disease, or unspecified chronic kidney disease: Secondary | ICD-10-CM | POA: Insufficient documentation

## 2018-05-06 DIAGNOSIS — R159 Full incontinence of feces: Secondary | ICD-10-CM | POA: Diagnosis not present

## 2018-05-06 DIAGNOSIS — Z8719 Personal history of other diseases of the digestive system: Secondary | ICD-10-CM | POA: Insufficient documentation

## 2018-05-06 DIAGNOSIS — D649 Anemia, unspecified: Secondary | ICD-10-CM | POA: Insufficient documentation

## 2018-05-06 DIAGNOSIS — Z885 Allergy status to narcotic agent status: Secondary | ICD-10-CM | POA: Insufficient documentation

## 2018-05-06 DIAGNOSIS — M797 Fibromyalgia: Secondary | ICD-10-CM | POA: Insufficient documentation

## 2018-05-06 DIAGNOSIS — Z79899 Other long term (current) drug therapy: Secondary | ICD-10-CM | POA: Insufficient documentation

## 2018-05-06 DIAGNOSIS — E1122 Type 2 diabetes mellitus with diabetic chronic kidney disease: Secondary | ICD-10-CM | POA: Diagnosis not present

## 2018-05-06 DIAGNOSIS — G4733 Obstructive sleep apnea (adult) (pediatric): Secondary | ICD-10-CM | POA: Insufficient documentation

## 2018-05-06 MED ORDER — ACETAMINOPHEN 325 MG PO TABS
650.0000 mg | ORAL_TABLET | Freq: Once | ORAL | Status: DC
  Filled 2018-05-06: qty 2

## 2018-05-06 MED ORDER — KETOROLAC TROMETHAMINE 15 MG/ML IJ SOLN
15.0000 mg | Freq: Once | INTRAMUSCULAR | Status: AC
  Administered 2018-05-06: 15 mg via INTRAMUSCULAR
  Filled 2018-05-06: qty 1

## 2018-05-06 MED ORDER — LIDOCAINE 5 % EX PTCH
1.0000 | MEDICATED_PATCH | CUTANEOUS | Status: DC
  Administered 2018-05-06 – 2018-05-08 (×3): 1 via TRANSDERMAL
  Filled 2018-05-06 (×3): qty 1

## 2018-05-06 NOTE — Progress Notes (Addendum)
Consult request has been received. CSW attempting to follow up at present time.  CSW spoke to EDP who feels pt may be unsafe to D/C due to pain that pt is having a difficult time managing the pt's pain.  CSW staffed with CSW leadership who states if pt is appropriate for placement and per EDP, pt is unsafe to return home then to proceed with an attempt for placement.  CSW spoke with pt/pt's daughter Freeman Caldron at ph: [phone] and received verbal permission to refer pt out for placement and to create an FL-2 with pt's PASSR to assist with placement.  Pt/pt's daughter verified they were only looking for short term (less than 30 days) rehab placement.  CSW asked pt/pt's daughter if they were agreeable to: 1.  Pt has to stay the entire 30 days to be accepted and pt has to agree to this.  Pt/pt's daughter stated they would have to think about this due to the pt's SI being taken by the facility for the entire month.  Pt/pt's daughter stated they aren't sure they can do that.  2.  CSW stated to pt/pt's daughter that a reasonable attempt at placement will be made but that in the ED due to time constraints if placement is not immediately found then placement would not ba able to take place in the ED. Pt/pt's daughter voiced understanding.  3. CSW stated pt/pt's daughter must voice understanding that pt may need to be placed outside of [facility] if local facilities aren't available and pt/pt's daughter stated they could not agree to that.  4. CSW stated pt may have to sign over the pt's disability/SSI and pt/pt's daughter stated they were unsure if they could do that for the entire month.  CSW called [facility] who stated they could not take the pt for short-term placement.  CSW called Marcelino Duster with [facility] at ph: and did not get a call back.  CSW called Velna Hatchet at [facility] and did not get a call back.  CSW called Kuiristin at [facility] who verified they could not  take the pt for short-term rehab with Medicaid.  CSW called [place] who stated they would look at the referral and call the CSW back shortly.  CSW will continue to follow for D/C needs.  Dorothe Pea. Jarah Pember, LCSW, LCAS, CSI Clinical Social Worker Ph: [phone]

## 2018-05-06 NOTE — ED Triage Notes (Signed)
Pt presents for evaluation of L knee pain since beginning of the month. States has had injection as well.

## 2018-05-06 NOTE — ED Notes (Signed)
ED Provider at bedside. 

## 2018-05-06 NOTE — ED Provider Notes (Addendum)
[facility] EMERGENCY DEPARTMENT Provider Note   CSN: [id number] Arrival date & time: 05/06/18  1246   History   Chief Complaint Chief Complaint  Patient presents with  . Knee Pain    HPI Yesenia [name] is a 57 y.o. female with past medical history significant for bilateral knee osteoarthritis, morbid obesity, hypertension, hyperlipidemia, chronic anemia who presents for evaluation of left knee pain.  Patient states she has had this pain for over 1 month.  Patient has been seen in the emergency department multiple times for this pain.  Patient has seen Dr. Magnus Ivan, with orthopedics for this issue.  Patient states she received a steroid injection in both of her knees which did not help.  Patient states that she is unable to have surgery on her knees due to her weight.  The family are concerned that patient has not been ambulatory at home secondary to pain.  Family and patient are requesting to talk with social work for placement of patient.  States she has had mild left hip pain.  No known trauma to hip.  Pain has been there for approximately 1 week.  Patient states that she has pain because she has not been able to move at home.  Denies fever, chills, nausea, vomiting, numbness, tingling, weakness.  Patient rates her overall pain a 10/10.  Pain does not radiate.  She states she has been unable to walk secondary to pain.  States she called her primary care provider and they told her to come to the emergency room for evaluation. Daughter states patient cannot go home due to immobility secondary to pain.  HPI  Past Medical History:  Diagnosis Date  . Abdominal pain, epigastric   . Abscess   . Acute stomach ulcer 08/15/2017  . AKI (acute kidney injury) (HCC)    Acute kidney injury on CKD stage II-III/notes 08/17/2017  . Anemia   . Anemia   . Arthritis    "knees" (08/17/2017)  . Bleeding 06/23/2016  . Blood transfusion without reported diagnosis 2012   anemia  . Cerebral  infarction (HCC) 01/14/2013  . Chest pain 02/15/2013  . Chronic pain   . CKD (chronic kidney disease)    Stage 3  . Depression   . Fibromyalgia    hospitilized 12/16 due to inability to walk  . Gastric ulcer   . History of gastritis 2019  . Hyperlipemia   . Hypertension   . Insomnia   . Iron deficiency anemia 05/26/2016  . Neuropathy   . Right sided weakness   . Sleep apnea    "CPAPs didn't work for me; that's why they did OR" (08/17/2017)  . Stroke [facility]) 2014?   no residual weakness   . 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 Tremor 10/25/2017  . Type II diabetes mellitus (HCC)     does not check CBG's (08/17/2017), no meds  . Vitamin deficiency    Vit D    Patient Active Problem List   Diagnosis Date Noted  . Hyperthyroidism 04/20/2018  . Dysphagia 08/16/2017  . Nausea and vomiting   . MDD (major depressive disorder), recurrent severe, without psychosis (HCC)   . AKI (acute kidney injury) (HCC) 08/15/2017  . Sleep apnea 03/20/2017  . Acute on chronic respiratory failure with hypoxemia (HCC) 09/30/2016  . Acute tracheitis 09/30/2016  . Irritable bowel syndrome 05/21/2016  . HCAP (healthcare-associated pneumonia) 03/06/2016  . Fibromyalgia 02/22/2016  . CKD (chronic kidney disease) stage 3, GFR 30-59 ml/min (HCC) 02/16/2016  .  OSA (obstructive sleep apnea) 02/13/2016  . GERD (gastroesophageal reflux disease) 07/10/2015  . Acute kidney injury superimposed on chronic kidney disease (HCC) 07/07/2015  . Morbid (severe) obesity due to excess calories (HCC) complicated by OSA 07/07/2015  . Type 2 diabetes mellitus with stage 3 chronic kidney disease, without long-term current use of insulin (HCC) 04/11/2013  . Dyslipidemia 04/11/2013  . Essential hypertension 01/14/2013    Past Surgical History:  Procedure Laterality Date  . ESOPHAGOGASTRODUODENOSCOPY (EGD) WITH PROPOFOL N/A 08/16/2017   Procedure: ESOPHAGOGASTRODUODENOSCOPY (EGD) WITH PROPOFOL;  Surgeon: Benancio Deeds,  MD;  Location[facility] ENDOSCOPY;  Service: Gastroenterology;  Laterality: N/A;  . KNEE ARTHROPLASTY  1997   "stuck rods in it"  . KNEE ARTHROSCOPY Right 1992, 1995,  . OOPHORECTOMY Right 1993?  . TRACHEOSTOMY TUBE PLACEMENT N/A 02/13/2016   Procedure: TRACHEOSTOMY;  Surgeon: Christia Reading, MD;  Location: [facility] OR;  Service: ENT;  Laterality: N/A;  . TUBAL LIGATION       OB History   None      Home Medications    Prior to Admission medications   Medication Sig Start Date End Date Taking? Authorizing Provider  amLODipine (NORVASC) 5 MG tablet Take 1 tablet (5 mg total) by mouth daily. 04/03/18   Freddrick March, MD  atenolol (TENORMIN) 100 MG tablet Take 1 tablet (100 mg total) by mouth daily. 05/05/18   Melene Plan, MD  busPIRone (BUSPAR) 15 MG tablet Take 15 mg by mouth 2 (two) times daily. 12/28/17   [provider]  clonazePAM (KLONOPIN) 1 MG tablet Take 1 tablet (1 mg total) by mouth 3 (three) times daily as needed for anxiety. 04/27/18   Melene Plan, MD  diclofenac sodium (VOLTAREN) 1 % GEL Apply 2 g topically 4 (four) times daily. Patient not taking: Reported on 05/02/2018 04/12/18   Aviva Kluver B, PA-C  diclofenac sodium (VOLTAREN) 1 % GEL Apply 2 g topically 3 (three) times daily as needed. Patient not taking: Reported on 05/02/2018 04/17/18   Cammy Copa, MD  DULoxetine (CYMBALTA) 60 MG capsule Take 60 mg by mouth daily.     [provider]  gabapentin (NEURONTIN) 300 MG capsule Take 900 mg by mouth at bedtime. 08/01/17   [provider]  hydrOXYzine (ATARAX/VISTARIL) 25 MG tablet Take 1 tablet (25 mg total) by mouth at bedtime. 02/16/18   Arlyce Harman, DO  hyoscyamine (LEVSIN SL) 0.125 MG SL tablet TAKE 0.125 mg TABLET BY MOUTH FOUR TIMES DAILY FOR IBS 04/15/17   [provider]  linaclotide (LINZESS) 72 MCG capsule Take 1 capsule (72 mcg total) by mouth daily before breakfast. Patient not taking: Reported on 03/31/2018 03/29/18   Freddrick March, MD  loperamide (IMODIUM A-D) 2 MG tablet Use as needed Patient not taking: Reported on 03/31/2018 01/06/18   Benancio Deeds, MD  methimazole (TAPAZOLE) 10 MG tablet Take 1 tablet (10 mg total) by mouth daily. 05/05/18   Melene Plan, MD  metoCLOPramide (REGLAN) 5 MG tablet Take 1 tablet (5 mg total) by mouth every 8 (eight) hours as needed for nausea. Patient not taking: Reported on 05/02/2018 01/06/18   Benancio Deeds, MD  mirtazapine (REMERON) 15 MG tablet Take 1 tablet (15 mg total) by mouth at bedtime. 02/10/18   Armbruster, Willaim Rayas, MD  montelukast (SINGULAIR) 10 MG tablet Take 10 mg by mouth at bedtime.  10/05/17   [provider]  Omega-3 Fatty Acids (FISH OIL) 1000 MG CAPS Take 1,000 mg  by mouth daily.  09/10/17   [provider]  ondansetron (ZOFRAN-ODT) 8 MG disintegrating tablet Take 1 tablet (8 mg total) by mouth every 8 (eight) hours as needed for nausea or vomiting. 04/04/18   Armbruster, Willaim Rayas, MD  pantoprazole (PROTONIX) 40 MG tablet Take 1 tablet (40 mg total) by mouth 2 (two) times daily. 08/21/17   Darlin Drop, DO  rosuvastatin (CRESTOR) 10 MG tablet Take 10 mg by mouth daily. 12/28/17   [provider]  traZODone (DESYREL) 50 MG tablet Take 1 tablet (50 mg total) by mouth at bedtime. Patient not taking: Reported on 05/02/2018 04/18/18   Melene Plan, MD  Vitamin D, Ergocalciferol, (DRISDOL) 50000 units CAPS capsule Take 50,000 Units by mouth every 7 (seven) days. On Monday 02/18/17   [provider]    Family History Family History  Problem Relation Age of Onset  . Breast cancer Mother   . Leukemia Brother   . Breast cancer Maternal Grandmother   . Colon cancer Neg Hx   . Esophageal cancer Neg Hx   . Rectal cancer Neg Hx   . Stomach cancer Neg Hx     Social History Social History   Tobacco Use  . Smoking status: Never Smoker  . Smokeless tobacco: Never Used  Substance Use Topics  . Alcohol use: No     Alcohol/week: 0.0 standard drinks  . Drug use: No     Allergies   Buprenorphine hcl and Morphine and related   Review of Systems Review of Systems  Eyes: Negative.   Respiratory: Negative.   Cardiovascular: Negative.   Gastrointestinal: Negative.   Genitourinary: Negative.   Musculoskeletal: Negative.        Left hip and bilateral knee pain.  Skin: Negative.   Neurological: Negative.   All other systems reviewed and are negative.    Physical Exam Updated Vital Signs BP 128/72 (BP Location: Right Wrist)   Pulse 77   Temp 98.4 F (36.9 C) (Oral)   Resp 16   SpO2 94%   Physical Exam  Constitutional: She appears well-developed and well-nourished. No distress.  HENT:  Head: Atraumatic.  Eyes: Pupils are equal, round, and reactive to light.  Neck: Normal range of motion.  Cardiovascular: Normal rate, regular rhythm, normal heart sounds and intact distal pulses. Exam reveals no gallop and no friction rub.  No murmur heard. Pulmonary/Chest: Breath sounds normal. No stridor. No respiratory distress. She has no wheezes. She has no rales. She exhibits no tenderness.  Abdominal: Soft. Bowel sounds are normal. She exhibits no distension and no mass. There is no tenderness. There is no rebound and no guarding. No hernia.  Musculoskeletal: Normal range of motion.  Patient unwilling to participate in musculoskeletal exam.  States "it hurts when I move my knees."  No tenderness to palpation to bilateral lower extremity.  No tenderness to bilateral hips or pelvis.  No appreciable swelling to bilateral lower extremity.  No erythema or warmth to bilateral lower extremity.  Neurological: She is alert.  Skin: Skin is warm and dry.  Psychiatric: She has a normal mood and affect.  Nursing note and vitals reviewed.    ED Treatments / Results  Labs (all labs ordered are listed, but only abnormal results are displayed) Labs Reviewed - No data to display  EKG None  Radiology Dg Hip  Unilat W Or Wo Pelvis 2-3 Views Left  Result Date: 05/06/2018 CLINICAL DATA:  Left hip pain more than usual. EXAM: DG  HIP (WITH OR WITHOUT PELVIS) 2-3V LEFT COMPARISON:  07/06/2015 FINDINGS: There is lumbar facet arthropathy of the included lower lumbar spine from L4 through S1. No pelvic diastasis or fracture. No significant joint space narrowing of the included hips. No acute fracture or bone destruction. IMPRESSION: 1. Degenerative facet arthropathy of the included lumbar spine. 2. No acute displaced fracture of the left hip nor suspicious/aggressive osseous lesions. 3. No pelvic diastasis or fracture. Electronically Signed   By: Tollie Eth M.D.   On: 05/06/2018 15:01    Procedures Procedures (including critical care time)  Medications Ordered in ED Medications  lidocaine (LIDODERM) 5 % 1 patch (1 patch Transdermal Patch Applied 05/06/18 1729)  ketorolac (TORADOL) 15 MG/ML injection 15 mg (15 mg Intramuscular Given 05/06/18 1420)     Initial Impression / Assessment and Plan / ED Course  I have reviewed the triage vital signs and the nursing notes.  Pertinent labs & imaging results that were available during my care of the patient were reviewed by me and considered in my medical decision making (see chart for details).  57 year old female presents for evaluation of left knee pain. Afebrile, nonseptic, non-ill appearing. Pain has been ongoing for 1 month.  Pain is been significantly worsening. Has been seen by Dr. Magnus Ivan, with orthopedics for steroid injections, however how this is not been working.  Patient presents today with daughter because she is not able to function at home secondary to pain.  Patient's daughter is requesting a social work consult on initial exam because she cannot go home and take care of herself.  She started feels patient's health has been declining due to her pain.  Family requesting placement at this time.   Patient without edema, erythema or warmth to the knee.   She refuses to participate in the majority of the exam, stressing she does not want to feel pain.  Has voiced concerns for pain in her left hip.  MSK exam limited secondary to patient lack of willingness to participate. Will obtain plain film.  Plain film negative for fracture dislocation left hip.  Low suspicion for septic knee or hip.  Patient states her knee pain is the same chronic pain that she has had.  I do not feel she needs additional imaging at this time as she states her pain has remained unchanged and has had no additional injuries or falls to this area. Low suspicion for DVT causing pain.  Given concerns that patient is unable to ambulate due to bilateral knee pain will consult with social worker for placement.  York Grice, SW has poken with patient's family multiple times.  He is actively trying to find placement for patient.  Disposition pending on placement.  I do not see any medical need to admit the patient at this time.  Patient care transferred to Harlene Salts, PA-C at shift change, who will determine ultimate disposition of patient.   Final Clinical Impressions(s) / ED Diagnoses   Final diagnoses:  Chronic pain of both knees    ED Discharge Orders    None       Davetta Olliff A, PA-C 05/06/18 1742    Onnie Alatorre A, PA-C 05/06/18 1743    Eber Hong, MD 05/08/18 1219

## 2018-05-06 NOTE — [place] FL2 (Signed)
[place] MEDICAID FL2 LEVEL OF CARE SCREENING TOOL     IDENTIFICATION  Patient Name: Yesenia [name] Birthdate: September 20, 1960 Sex: female Admission Date (Current Location): 05/06/2018  [place] and [place] Number:  [name] [id number] Q Facility and Address:         Provider Number:    Attending Physician Name and Address:  Charlynne Pander, MD  Relative Name and Phone Number:       Current Level of Care: Hospital Recommended Level of Care: Skilled Nursing Facility Prior Approval Number:    Date Approved/Denied: 05/06/18 PASRR Number: [id number]  Discharge Plan: SNF    Current Diagnoses: Patient Active Problem List   Diagnosis Date Noted  . Hyperthyroidism 04/20/2018  . Dysphagia 08/16/2017  . Nausea and vomiting   . MDD (major depressive disorder), recurrent severe, without psychosis (HCC)   . AKI (acute kidney injury) (HCC) 08/15/2017  . Sleep apnea 03/20/2017  . Acute on chronic respiratory failure with hypoxemia (HCC) 09/30/2016  . Acute tracheitis 09/30/2016  . Irritable bowel syndrome 05/21/2016  . HCAP (healthcare-associated pneumonia) 03/06/2016  . Fibromyalgia 02/22/2016  . CKD (chronic kidney disease) stage 3, GFR 30-59 ml/min (HCC) 02/16/2016  . OSA (obstructive sleep apnea) 02/13/2016  . GERD (gastroesophageal reflux disease) 07/10/2015  . Acute kidney injury superimposed on chronic kidney disease (HCC) 07/07/2015  . Morbid (severe) obesity due to excess calories (HCC) complicated by OSA 07/07/2015  . Type 2 diabetes mellitus with stage 3 chronic kidney disease, without long-term current use of insulin (HCC) 04/11/2013  . Dyslipidemia 04/11/2013  . Essential hypertension 01/14/2013    Orientation RESPIRATION BLADDER Height & Weight     Self, Time, Situation, Place  Normal Continent Weight:   Height:     BEHAVIORAL SYMPTOMS/MOOD NEUROLOGICAL BOWEL NUTRITION STATUS      Continent Diet(Heart Healthy, Carb Moodified)  AMBULATORY STATUS  COMMUNICATION OF NEEDS Skin   Limited Assist Verbally Normal                       Personal Care Assistance Level of Assistance  Dressing, Bathing Bathing Assistance: Limited assistance   Dressing Assistance: Limited assistance     Functional Limitations Info             SPECIAL CARE FACTORS FREQUENCY  PT (By licensed PT), OT (By licensed OT)     PT Frequency: 5 OT Frequency: 5            Contractures Contractures Info: Not present    Additional Factors Info  Allergies, Code Status Code Status Info: PRIOR Allergies Info: Buprenorphine Hcl, Morphine And Related           Current Medications (05/06/2018):  This is the current hospital active medication list Current Facility-Administered Medications  Medication Dose Route Frequency Provider Last Rate Last Dose  . 0.9 %  sodium chloride infusion  500 mL Intravenous Once Armbruster, Willaim Rayas, MD      . 0.9 %  sodium chloride infusion  500 mL Intravenous Once Armbruster, Willaim Rayas, MD      . lidocaine (LIDODERM) 5 % 1 patch  1 patch Transdermal Q24H Eber Hong, MD   1 patch at 05/06/18 1729   Current Outpatient Medications  Medication Sig Dispense Refill  . amLODipine (NORVASC) 5 MG tablet Take 1 tablet (5 mg total) by mouth daily. 30 tablet 0  . atenolol (TENORMIN) 100 MG tablet Take 1 tablet (100 mg total) by mouth daily. 90 tablet 3  . busPIRone (BUSPAR) 15  MG tablet Take 15 mg by mouth 2 (two) times daily.  0  . clonazePAM (KLONOPIN) 1 MG tablet Take 1 tablet (1 mg total) by mouth 3 (three) times daily as needed for anxiety. 10 tablet 0  . diclofenac sodium (VOLTAREN) 1 % GEL Apply 2 g topically 4 (four) times daily. (Patient not taking: Reported on 05/02/2018) 1 Tube 0  . diclofenac sodium (VOLTAREN) 1 % GEL Apply 2 g topically 3 (three) times daily as needed. (Patient not taking: Reported on 05/02/2018) 1 Tube 1  . DULoxetine (CYMBALTA) 60 MG capsule Take 60 mg by mouth daily.     Marland Kitchen gabapentin (NEURONTIN)  300 MG capsule Take 900 mg by mouth at bedtime.  1  . hydrOXYzine (ATARAX/VISTARIL) 25 MG tablet Take 1 tablet (25 mg total) by mouth at bedtime. 30 tablet 0  . hyoscyamine (LEVSIN SL) 0.125 MG SL tablet TAKE 0.125 mg TABLET BY MOUTH FOUR TIMES DAILY FOR IBS  2  . linaclotide (LINZESS) 72 MCG capsule Take 1 capsule (72 mcg total) by mouth daily before breakfast. (Patient not taking: Reported on 03/31/2018) 30 capsule 3  . loperamide (IMODIUM A-D) 2 MG tablet Use as needed (Patient not taking: Reported on 03/31/2018) 30 tablet 0  . methimazole (TAPAZOLE) 10 MG tablet Take 1 tablet (10 mg total) by mouth daily. 30 tablet 0  . metoCLOPramide (REGLAN) 5 MG tablet Take 1 tablet (5 mg total) by mouth every 8 (eight) hours as needed for nausea. (Patient not taking: Reported on 05/02/2018) 60 tablet 0  . mirtazapine (REMERON) 15 MG tablet Take 1 tablet (15 mg total) by mouth at bedtime. 30 tablet 1  . montelukast (SINGULAIR) 10 MG tablet Take 10 mg by mouth at bedtime.   1  . Omega-3 Fatty Acids (FISH OIL) 1000 MG CAPS Take 1,000 mg by mouth daily.     . ondansetron (ZOFRAN-ODT) 8 MG disintegrating tablet Take 1 tablet (8 mg total) by mouth every 8 (eight) hours as needed for nausea or vomiting. 90 tablet 1  . pantoprazole (PROTONIX) 40 MG tablet Take 1 tablet (40 mg total) by mouth 2 (two) times daily. 60 tablet 0  . rosuvastatin (CRESTOR) 10 MG tablet Take 10 mg by mouth daily.  0  . traZODone (DESYREL) 50 MG tablet Take 1 tablet (50 mg total) by mouth at bedtime. (Patient not taking: Reported on 05/02/2018) 60 tablet 0  . Vitamin D, Ergocalciferol, (DRISDOL) 50000 units CAPS capsule Take 50,000 Units by mouth every 7 (seven) days. On Monday  1     Discharge Medications: Please see discharge summary for a list of discharge medications.  Relevant Imaging Results:  Relevant Lab Results:   Additional Information [id number]  Dorothe Pea Cletus Paris, LCSWA

## 2018-05-06 NOTE — Progress Notes (Addendum)
CSW verified through [facility] MUST pt's PASSR is [id number].  CSW will continue to follow for D/C needs.  Per pt/pt's daughter Freeman Caldron at ph: [phone] if pt must "sign over" her disability/SSI for the month then SNF placement is not an option, but if pt can go to a SNF using her Medicaid for short-term rehab (pt/pt's daughter understands pt MUST stay for the full 30 days) without using her  SSI/disability and a facility accepts the pt in the [place] area then pt can be placed (pt/pt's daughter state they will not be willing to be placed outside of [place] and if no placement is found quickly within [place] then pt must be D/C'd without placement due to the pt/pt's daughter's unwillingness for the pt to be placed outside of [place]).  Pt/pt's daughter updated.  EPD agreeable to placing a PT consult.  CSW will continue to follow for D/C needs.  Dorothe Pea. Jeryl Umholtz, LCSW, LCAS, CSI Clinical Social Worker Ph: [phone]          '

## 2018-05-06 NOTE — ED Notes (Signed)
Patient transported to X-ray 

## 2018-05-06 NOTE — Clinical Social Work Note (Signed)
Clinical Social Work Assessment  Patient Details  Name: Yesenia [name] MRN: [id number] Date of Birth: 1961/01/29  Date of referral:  05/06/18               Reason for consult:  Facility Placement                Permission sought to share information with:  Oceanographer granted to share information::  Yes, Verbal Permission Granted  Name::        Agency::     Relationship::     Contact Information:     Housing/Transportation Living arrangements for the past 2 months:  Single Family Home Source of Information:  Patient, Adult Children Patient Interpreter Needed:  None Criminal Activity/Legal Involvement Pertinent to Current Situation/Hospitalization:    Significant Relationships:  Adult Children Lives with:  Self Do you feel safe going back to the place where you live?  No Need for family participation in patient care:  Yes (Comment)  Care giving concerns:  CSW staffed with CSW leadership who states if pt is appropriate for placement and per EDP, pt is unsafe to return home then to proceed with an attempt for placement.  CSW spoke with pt/pt's daughter Yesenia [name] at ph: [phone] and received verbal permission to refer pt out for placement and to create an FL-2 with pt's PASSR to assist with placement.  Pt/pt's daughter verified they were only looking for short term (less than 30 days) rehab placement.  CSW asked pt/pt's daughter if they were agreeable to: 1.  Pt has to stay the entire 30 days to be accepted and pt has to agree to this.  Pt/pt's daughter stated they would have to think about this due to the pt's SI being taken by the facility for the entire month.  Pt/pt's daughter stated they aren't sure they can do that.  2.  CSW stated to pt/pt's daughter that a reasonable attempt at placement will be made but that in the ED due to time constraints if placement is not immediately found then placement would not ba able to take place in the  ED. Pt/pt's daughter voiced understanding.  3. CSW stated pt/pt's daughter must voice understanding that pt may need to be placed outside of [facility] if local facilities aren't available and pt/pt's daughter stated they could not agree to that.  4. CSW stated pt may have to sign over the pt's disability/SSI and pt/pt's daughter stated they were unsure if they could do that for the entire month.  CSW called [facility] who stated they could not take the pt for short-term placement.  CSW called Marcelino Duster with [facility] at ph: and did not get a call back.  CSW called Velna Hatchet at [facility] and did not get a call back.  CSW called Kuiristin at [facility] who verified they could not take the pt for short-term rehab with Medicaid.  CSW called [place] who stated they would look at the referral and call the CSW back shortly.    Social Worker assessment / plan:  CSW spoke with pt/pt's daughter and confirmed pt's plan to be discharged to SNF for short-term rehab at discharge IF pt does not have to sign over her check.  CSW provided active listening and validated pt's concerns.   CSW will complete FL-2 and send referrals out to SNF facilities via the hub per pt's request.  Pt has been living independently prior to being admitted to [facility].  Employment status:  Insurance information:  Medicaid In [place] PT Recommendations:  Not assessed at this time Information / Referral to community resources:     Patient/Family's Response to care:  Patient alert and oriented.  Patient and pt's daughter agreeable to plan.  Pt and pt's daughter  supportive and strongly involved in pt.'s care.  Pt/pt's daughter pleasant and appreciated CSW intervention.   Patient/Family's Understanding of and Emotional Response to Diagnosis, Current Treatment, and Prognosis:  Still assessing  Emotional Assessment Appearance:    Attitude/Demeanor/Rapport:    Affect (typically observed):  Adaptable,  Accepting, Calm, Hopeful Orientation:  Oriented to Self Alcohol / Substance use:    Psych involvement (Current and /or in the community):     Discharge Needs  Concerns to be addressed:  (Pt will not accept pt outside of town and pt will NOT sign over her SSI check) Readmission within the last 30 days:  No Current discharge risk:  None Barriers to Discharge:  No Barriers Identified   Mercy Riding, LCSWA 05/06/2018, 6:34 PM

## 2018-05-06 NOTE — ED Notes (Signed)
Pt resting with no complaints.  Lights turned down call light in reach

## 2018-05-07 ENCOUNTER — Observation Stay (HOSPITAL_COMMUNITY): Payer: Medicaid Other

## 2018-05-07 ENCOUNTER — Other Ambulatory Visit: Payer: Self-pay

## 2018-05-07 DIAGNOSIS — G8929 Other chronic pain: Secondary | ICD-10-CM | POA: Diagnosis not present

## 2018-05-07 DIAGNOSIS — R52 Pain, unspecified: Secondary | ICD-10-CM | POA: Diagnosis present

## 2018-05-07 DIAGNOSIS — M25562 Pain in left knee: Secondary | ICD-10-CM

## 2018-05-07 DIAGNOSIS — M25561 Pain in right knee: Secondary | ICD-10-CM | POA: Diagnosis not present

## 2018-05-07 LAB — CBC WITH DIFFERENTIAL/PLATELET
ABS IMMATURE GRANULOCYTES: 0.02 10*3/uL (ref 0.00–0.07)
BASOS ABS: 0 10*3/uL (ref 0.0–0.1)
BASOS PCT: 0 %
Eosinophils Absolute: 0.2 10*3/uL (ref 0.0–0.5)
Eosinophils Relative: 2 %
HCT: 33.4 % — ABNORMAL LOW (ref 36.0–46.0)
Hemoglobin: 10.3 g/dL — ABNORMAL LOW (ref 12.0–15.0)
IMMATURE GRANULOCYTES: 0 %
Lymphocytes Relative: 46 %
Lymphs Abs: 4.2 10*3/uL — ABNORMAL HIGH (ref 0.7–4.0)
MCH: 24.6 pg — ABNORMAL LOW (ref 26.0–34.0)
MCHC: 30.8 g/dL (ref 30.0–36.0)
MCV: 79.7 fL — AB (ref 80.0–100.0)
MONOS PCT: 10 %
Monocytes Absolute: 1 10*3/uL (ref 0.1–1.0)
NEUTROS ABS: 3.9 10*3/uL (ref 1.7–7.7)
NEUTROS PCT: 42 %
PLATELETS: 357 10*3/uL (ref 150–400)
RBC: 4.19 MIL/uL (ref 3.87–5.11)
RDW: 14.7 % (ref 11.5–15.5)
WBC: 9.4 10*3/uL (ref 4.0–10.5)
nRBC: 0 % (ref 0.0–0.2)

## 2018-05-07 LAB — COMPREHENSIVE METABOLIC PANEL
ALBUMIN: 2.9 g/dL — AB (ref 3.5–5.0)
ALT: 19 U/L (ref 0–44)
AST: 16 U/L (ref 15–41)
Alkaline Phosphatase: 90 U/L (ref 38–126)
Anion gap: 11 (ref 5–15)
BUN: 33 mg/dL — AB (ref 6–20)
CHLORIDE: 108 mmol/L (ref 98–111)
CO2: 22 mmol/L (ref 22–32)
CREATININE: 0.92 mg/dL (ref 0.44–1.00)
Calcium: 10 mg/dL (ref 8.9–10.3)
GFR calc Af Amer: >60 mL/min (ref >60)
GFR calc non Af Amer: >60 mL/min (ref >60)
Glucose, Bld: 99 mg/dL (ref 70–99)
Potassium: 4.1 mmol/L (ref 3.5–5.1)
SODIUM: 141 mmol/L (ref 135–145)
Total Bilirubin: 0.3 mg/dL (ref 0.3–1.2)
Total Protein: 6.5 g/dL (ref 6.5–8.1)

## 2018-05-07 LAB — URINALYSIS, ROUTINE W REFLEX MICROSCOPIC
Bilirubin Urine: NEGATIVE
GLUCOSE, UA: NEGATIVE mg/dL
Ketones, ur: NEGATIVE mg/dL
Nitrite: POSITIVE — AB
PROTEIN: NEGATIVE mg/dL
Specific Gravity, Urine: 1.017 (ref 1.005–1.030)
pH: 5 (ref 5.0–8.0)

## 2018-05-07 LAB — T4, FREE: Free T4: 2.76 ng/dL — ABNORMAL HIGH (ref 0.82–1.77)

## 2018-05-07 LAB — HEMOGLOBIN A1C
Hgb A1c MFr Bld: 5.5 % (ref 4.8–5.6)
Mean Plasma Glucose: 111.15 mg/dL

## 2018-05-07 LAB — TSH: TSH: <0.01 u[IU]/mL — ABNORMAL LOW (ref 0.350–4.500)

## 2018-05-07 LAB — MRSA PCR SCREENING: MRSA BY PCR: NEGATIVE

## 2018-05-07 MED ORDER — ENOXAPARIN SODIUM 40 MG/0.4ML ~~LOC~~ SOLN
40.0000 mg | Freq: Every day | SUBCUTANEOUS | Status: DC
  Administered 2018-05-07 – 2018-05-09 (×3): 40 mg via SUBCUTANEOUS
  Filled 2018-05-07 (×3): qty 0.4

## 2018-05-07 MED ORDER — CEPHALEXIN 500 MG PO CAPS
500.0000 mg | ORAL_CAPSULE | Freq: Two times a day (BID) | ORAL | Status: DC
  Administered 2018-05-07 – 2018-05-09 (×5): 500 mg via ORAL
  Filled 2018-05-07 (×5): qty 1

## 2018-05-07 MED ORDER — ACETAMINOPHEN 325 MG PO TABS
650.0000 mg | ORAL_TABLET | Freq: Four times a day (QID) | ORAL | Status: DC | PRN
  Administered 2018-05-07 – 2018-05-08 (×5): 650 mg via ORAL
  Filled 2018-05-07 (×5): qty 2

## 2018-05-07 MED ORDER — TRAZODONE HCL 50 MG PO TABS
50.0000 mg | ORAL_TABLET | Freq: Every day | ORAL | Status: DC
  Administered 2018-05-07 – 2018-05-08 (×2): 50 mg via ORAL
  Filled 2018-05-07 (×2): qty 1

## 2018-05-07 MED ORDER — AMLODIPINE BESYLATE 5 MG PO TABS
5.0000 mg | ORAL_TABLET | Freq: Every day | ORAL | Status: DC
  Administered 2018-05-07 – 2018-05-09 (×3): 5 mg via ORAL
  Filled 2018-05-07 (×3): qty 1

## 2018-05-07 MED ORDER — ATENOLOL 50 MG PO TABS
100.0000 mg | ORAL_TABLET | Freq: Every day | ORAL | Status: DC
  Administered 2018-05-07 – 2018-05-09 (×3): 100 mg via ORAL
  Filled 2018-05-07 (×4): qty 2

## 2018-05-07 MED ORDER — ACETAMINOPHEN 650 MG RE SUPP: 650.0000 mg | Freq: Four times a day (QID) | RECTAL | Status: DC | PRN

## 2018-05-07 MED ORDER — POLYETHYLENE GLYCOL 3350 17 G PO PACK
17.0000 g | PACK | Freq: Every day | ORAL | Status: DC | PRN
  Filled 2018-05-07: qty 1

## 2018-05-07 MED ORDER — GABAPENTIN 300 MG PO CAPS
900.0000 mg | ORAL_CAPSULE | Freq: Every day | ORAL | Status: DC
  Administered 2018-05-07 – 2018-05-08 (×2): 900 mg via ORAL
  Filled 2018-05-07 (×2): qty 3

## 2018-05-07 MED ORDER — DICLOFENAC SODIUM 1 % TD GEL
4.0000 g | Freq: Four times a day (QID) | TRANSDERMAL | Status: DC
  Administered 2018-05-07 – 2018-05-09 (×6): 4 g via TOPICAL
  Filled 2018-05-07: qty 100

## 2018-05-07 MED ORDER — METHIMAZOLE 10 MG PO TABS
10.0000 mg | ORAL_TABLET | Freq: Every day | ORAL | Status: DC
  Administered 2018-05-07 – 2018-05-09 (×3): 10 mg via ORAL
  Filled 2018-05-07 (×3): qty 1

## 2018-05-07 MED ORDER — MIRTAZAPINE 15 MG PO TABS
15.0000 mg | ORAL_TABLET | Freq: Every day | ORAL | Status: DC
  Administered 2018-05-07 – 2018-05-08 (×2): 15 mg via ORAL
  Filled 2018-05-07 (×2): qty 1

## 2018-05-07 MED ORDER — PANTOPRAZOLE SODIUM 40 MG PO TBEC
40.0000 mg | DELAYED_RELEASE_TABLET | Freq: Two times a day (BID) | ORAL | Status: DC
  Administered 2018-05-07 – 2018-05-09 (×5): 40 mg via ORAL
  Filled 2018-05-07 (×5): qty 1

## 2018-05-07 NOTE — ED Provider Notes (Addendum)
Patient care handoff received from [place], PA-C at shift change.  Please see her note for details.  In short patient here for chronic bilateral osteoarthritis that has acutely worsened in the past month.  Patient not a surgical candidate due to comorbidities.  Patient is unable to ambulate due to pain and subjective weakness of lower extremities. Physical Exam  BP 103/65   Pulse 79   Temp 98.4 F (36.9 C) (Oral)   Resp 16   SpO2 99%   Physical Exam  Constitutional: She is oriented to person, place, and time. She appears well-developed and well-nourished. No distress.  Morbidly obese  HENT:  Head: Normocephalic and atraumatic.  Right Ear: External ear normal.  Left Ear: External ear normal.  Nose: Nose normal.  Eyes: Pupils are equal, round, and reactive to light. EOM are normal.  Neck: Trachea normal and normal range of motion. No tracheal deviation present.  Cardiovascular:  Pulses:      Dorsalis pedis pulses are 2+ on the right side, and 2+ on the left side.       Posterior tibial pulses are 2+ on the right side, and 2+ on the left side.  Pulmonary/Chest: Effort normal. No respiratory distress.  Abdominal: Soft. There is no tenderness. There is no rebound and no guarding.  Musculoskeletal: Normal range of motion. She exhibits tenderness.  Patient with bilateral knee pain.  No overlying erythema, no obvious swelling, no increased warmth.  Patient reluctant to move lower extremities due to pain.  Feet:  Right Foot:  Protective Sensation: 3 sites tested. 3 sites sensed.  Left Foot:  Protective Sensation: 3 sites tested. 3 sites sensed.  Neurological: She is alert and oriented to person, place, and time.  Skin: Skin is warm and dry.  Psychiatric: She has a normal mood and affect. Her behavior is normal.    ED Course/Procedures   Clinical Course as of May 07 12  Sat May 06, 2018  1729 Multiple encounters for Left Knee pain; seen by dr. Magnus Ivan. Chronic bilateral  osteoarthritis. Not surgical candidate. Presents today for admission for pain control for chronic pain. Seen Dr. Magnus Ivan last week with f/u in 4 days. Refuses to ambulate. CT of pelvis for new pain. Lidocaine patch has helped. Social work involved for placement.    [BM]  1831 Call from CSW still attempting placement.   [BM]  2302 Discussed with Dr. Silverio Lay; attempt admission for hyperthyroid and pain through [facility].   [BM]  2358 Discussed with Family Medicine who will admit patient for observation due to weakness associated with Graves disease and pain.   [BM]    Clinical Course User Index [BM] Elizabeth Palau    Procedures  MDM  57 year old female with multiple comorbidities including morbid obesity and Graves' disease presenting today for bilateral lower extremity pain greater than left compared to right.  Work-up today does not show signs of infection, no signs of septic arthritis or cellulitis.  Patient is afebrile, not tachycardic, not hypotensive, does not meet SIRS criteria.  Despite medical work-up not showing acute findings patient is unable to ambulate due to severe bilateral knee pain.  Due to patient's morbid obesity, debilitated state and inability to ambulate patient is being placed for skilled nursing facility by social work.  Social work to place patient in facility tomorrow per their note.  CMP nonacute CBC nonacute TSH, T3, T4 pending Imaging of hips without acute findings however with degenerative changes  Patient discussed with  Dr. Silverio Lay.  Due to patient's multiple comorbidities including morbid obesity, uncontrolled Graves' disease and inability to ambulate due to severe chronic pain patient to be admitted by family medicine.  Patient is known to [facility].  Case discussed with family medicine medicine MD who agrees to admit patient for pain control.  Patient has been admitted by Dr. Manson Passey for observation    Note: Portions  of this report may have been transcribed using voice recognition software. Every effort was made to ensure accuracy; however, inadvertent computerized transcription errors may still be present.     Bill Salinas, PA-C 05/07/18 0142    Bill Salinas, PA-C 05/07/18 0143    Charlynne Pander, MD 05/07/18 [phone]

## 2018-05-07 NOTE — H&P (Signed)
Family Medicine Teaching [facility] Admission History and Physical Service Pager: [phone]  Patient name: Yesenia [name] Medical record number: [id number] Date of birth: 1961-02-19 Age: 57 y.o. Gender: female  Primary Care Provider: Melene Plan, MD Consultants: none Code Status: Full  Chief Complaint: left knee pain   Assessment and Plan: Yesenia [name] is a 57 y.o. female presenting with left knee pain . PMH is significant for HTN, T2DM, HLD, morbid obesity, GERD, OSA, CKD, Fibromyalgia, IBS, MDD, hyperthyriodism   Left knee pain Acute worsening of left knee pain.  Recent visit with orthopedist on 10/7 for bilateral corticosteroid injections.  Patient reports improvement of pain with these injections however today the pain initiated again.  Patient reports no trauma to the area so unlikely a fracture.  On physical exam patient was refusing all range of motion exam due to pain.  Difficult to assess for ligament tear due to this.  We will plan to obtain x-ray of left knee to see if there is worsening of her osteoarthritis.  Knee x-ray from 04/11/2018 showing moderate tricompartmental osteoarthritis with acute osseous abnormalities.  Likely that knee pain is related to worsening osteoarthritis.  Inability to ambulate likely secondary to this pain.  Recent worsening of thyroid function seen by PCP.  This is unlikely because of difficulty with ambulation as pain is unilateral and patient reports that ambulation is difficult due to pain.  Unlikely septic joint as patient is afebrile with stable vitals and no leukocytosis seen on labs.  Hip x-rays obtained in ED showing no fractures or dislocations.  Will place in observation for pain management.  A lidocaine patches at this time while awaiting knee x-ray. -Admit for observation, attending Dr. Manson Passey -Continue lidocaine patch placed during ED -Tylenol as needed -4 view left knee x-ray -Vitals per routine -Up with assist -PT/OT consultation -Social  work consult in ED for placement, appreciate recommendations  Hyperthyroidism Recently seen by PCP, Dr. Selena Batten, for this.  Note from 05/05/2018 stating that atenolol will be increased to 100 mg, patient to start methimazole 10 mg, and follow-up thyroid function testing in 1 month.  Patient recommended to follow-up with endocrinology at that time.  Patient requesting RAI therapy.  Per chart review it appears patient is not taking methimazole.  We will plan to restart this as an inpatient to ensure control of thyroid.  Thyroid function test showing TSH low at less than 0.010 and elevated T4 of 2.76. -Continue methimazole -Ensure follow-up with endocrinology as outpatient  HTN Normotensive at 122/73.  Home meds:amlodipine 5mg , atenolol 100mg .  -continue home meds   T2DM Does not appear to be on medication at home. Last note from 06/2015 stating patient to continue glucophage 500mg  daily. Last A1C from 08/2017 of 7.3.  -recheck A1C -patient will likely benefit from medication as outpatient   IBS Home meds: linzess -continue home meds  MDD Home meds: klonopin 0.5mg  tid PRN, trazodone 50mg  qhs, mirtazapine 15mg  qhs -continue mirtazapine, trazodone -can consider adding klonopin PRN  FEN/GI: heart healthy/carb modified  Prophylaxis: lovenox   Disposition: admit for observation, attending Dr. Manson Passey   History of Present Illness:  Yesenia [name] is a 57 y.o. female presenting with left knee pain.   Patient presenting today for worsening left knee pain.  Patient states that she has bilateral knee pain that is been worsening over the last 3 weeks.  Saw her orthopedist on 10/7 and received bilateral knee injections.  Patient states this helped however this morning she woke up with  left knee pain that was worsened.  Patient was given diclofenac gel by orthopedist which does not help.  Patient states that today because of extreme knee pain she was unable to ambulate.  Because of this she called 911 and  arrived to ED via ambulance for pain control.  No history of recent falls or acute trauma.  No numbness or tingling.  No recent fevers or chills.  Does report some edema to that area.  Patient normally has to use 2 canes and a walker to get around.  Patient took all her medications at home on Saturday.  In the ED patient received lidocaine patch initially which alleviated some pain however after some time pain continued.  Received Toradol injection in ED.  Patient recommended by ED PA for admission for pain control.  Social work consulted in ED for placement.  FL to form completed in ED.  Review Of Systems: Per HPI with the following additions:   Review of Systems  Constitutional: Negative for chills and fever.  Respiratory: Negative for cough.   Cardiovascular: Negative for chest pain and palpitations.  Gastrointestinal: Negative for abdominal pain, nausea and vomiting.  Genitourinary: Negative for dysuria and urgency.  Musculoskeletal: Positive for joint pain.  Skin: Negative for rash.  Neurological: Positive for weakness.    Patient Active Problem List   Diagnosis Date Noted  . Pain 05/07/2018  . Hyperthyroidism 04/20/2018  . Dysphagia 08/16/2017  . Nausea and vomiting   . MDD (major depressive disorder), recurrent severe, without psychosis (HCC)   . AKI (acute kidney injury) (HCC) 08/15/2017  . Sleep apnea 03/20/2017  . Acute on chronic respiratory failure with hypoxemia (HCC) 09/30/2016  . Acute tracheitis 09/30/2016  . Irritable bowel syndrome 05/21/2016  . HCAP (healthcare-associated pneumonia) 03/06/2016  . Fibromyalgia 02/22/2016  . CKD (chronic kidney disease) stage 3, GFR 30-59 ml/min (HCC) 02/16/2016  . OSA (obstructive sleep apnea) 02/13/2016  . GERD (gastroesophageal reflux disease) 07/10/2015  . Acute kidney injury superimposed on chronic kidney disease (HCC) 07/07/2015  . Morbid (severe) obesity due to excess calories (HCC) complicated by OSA 07/07/2015  . Type 2  diabetes mellitus with stage 3 chronic kidney disease, without long-term current use of insulin (HCC) 04/11/2013  . Dyslipidemia 04/11/2013  . Essential hypertension 01/14/2013    Past Medical History: Past Medical History:  Diagnosis Date  . Abdominal pain, epigastric   . Abscess   . Acute stomach ulcer 08/15/2017  . AKI (acute kidney injury) (HCC)    Acute kidney injury on CKD stage II-III/notes 08/17/2017  . Anemia   . Anemia   . Arthritis    "knees" (08/17/2017)  . Bleeding 06/23/2016  . Blood transfusion without reported diagnosis 2012   anemia  . Cerebral infarction (HCC) 01/14/2013  . Chest pain 02/15/2013  . Chronic pain   . CKD (chronic kidney disease)    Stage 3  . Depression   . Fibromyalgia    hospitilized 12/16 due to inability to walk  . Gastric ulcer   . History of gastritis 2019  . Hyperlipemia   . Hypertension   . Insomnia   . Iron deficiency anemia 05/26/2016  . Neuropathy   . Right sided weakness   . Sleep apnea    "CPAPs didn't work for me; that's why they did OR" (08/17/2017)  . Stroke [facility]) 2014?   no residual weakness   . 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 Tremor 10/25/2017  . Type II diabetes mellitus (HCC)  does not check CBG's (08/17/2017), no meds  . Vitamin deficiency    Vit D    Past Surgical History: Past Surgical History:  Procedure Laterality Date  . ESOPHAGOGASTRODUODENOSCOPY (EGD) WITH PROPOFOL N/A 08/16/2017   Procedure: ESOPHAGOGASTRODUODENOSCOPY (EGD) WITH PROPOFOL;  Surgeon: Benancio Deeds, MD;  Location: [facility] ENDOSCOPY;  Service: Gastroenterology;  Laterality: N/A;  . KNEE ARTHROPLASTY  1997   "stuck rods in it"  . KNEE ARTHROSCOPY Right 1992, 1995,  . OOPHORECTOMY Right 1993?  . TRACHEOSTOMY TUBE PLACEMENT N/A 02/13/2016   Procedure: TRACHEOSTOMY;  Surgeon: Christia Reading, MD;  Location: [facility] OR;  Service: ENT;  Laterality: N/A;  . TUBAL LIGATION      Social History: Social History   Tobacco Use  . Smoking status: Never  Smoker  . Smokeless tobacco: Never Used  Substance Use Topics  . Alcohol use: No    Alcohol/week: 0.0 standard drinks  . Drug use: No   Additional social history: lives at home by herself.  No tobacco use, no alcohol or illicit drug use.  Please also refer to relevant sections of EMR.  Family History: Family History  Problem Relation Age of Onset  . Breast cancer Mother   . Leukemia Brother   . Breast cancer Maternal Grandmother   . Colon cancer Neg Hx   . Esophageal cancer Neg Hx   . Rectal cancer Neg Hx   . Stomach cancer Neg Hx     Allergies and Medications: Allergies  Allergen Reactions  . Buprenorphine Hcl Hives  . Morphine And Related Hives and Dermatitis   Current Facility-Administered Medications on File Prior to Encounter  Medication Dose Route Frequency Provider Last Rate Last Dose  . 0.9 %  sodium chloride infusion  500 mL Intravenous Once Armbruster, Willaim Rayas, MD      . 0.9 %  sodium chloride infusion  500 mL Intravenous Once Armbruster, Willaim Rayas, MD       Current Outpatient Medications on File Prior to Encounter  Medication Sig Dispense Refill  . amLODipine (NORVASC) 5 MG tablet Take 1 tablet (5 mg total) by mouth daily. 30 tablet 0  . atenolol (TENORMIN) 100 MG tablet Take 1 tablet (100 mg total) by mouth daily. 90 tablet 3  . Cholecalciferol (VITAMIN D3) 2000 units TABS Take 4,000 Units by mouth daily.    . clonazePAM (KLONOPIN) 0.5 MG tablet Take 0.5 mg by mouth 3 (three) times daily as needed for anxiety.    . diclofenac sodium (VOLTAREN) 1 % GEL Apply 2 g topically 3 (three) times daily as needed. (Patient taking differently: Apply 2 g topically 4 (four) times daily as needed (for pain- BOTH KNEES). ) 1 Tube 1  . gabapentin (NEURONTIN) 300 MG capsule Take 900 mg by mouth at bedtime.  1  . hydrOXYzine (VISTARIL) 50 MG capsule Take 50 mg by mouth at bedtime.     . hyoscyamine (LEVSIN SL) 0.125 MG SL tablet Take 0.125 mg by mouth 4 (four) times daily as needed  for cramping.   2  . metoCLOPramide (REGLAN) 5 MG tablet Take 1 tablet (5 mg total) by mouth every 8 (eight) hours as needed for nausea. 60 tablet 0  . mirtazapine (REMERON) 15 MG tablet Take 1 tablet (15 mg total) by mouth at bedtime. 30 tablet 1  . Omega-3 Fatty Acids (FISH OIL) 1000 MG CAPS Take 1,000 mg by mouth daily.     . ondansetron (ZOFRAN-ODT) 8 MG disintegrating tablet Take 1 tablet (8 mg total)  by mouth every 8 (eight) hours as needed for nausea or vomiting. 90 tablet 1  . pantoprazole (PROTONIX) 40 MG tablet Take 1 tablet (40 mg total) by mouth 2 (two) times daily. 60 tablet 0  . traMADol (ULTRAM) 50 MG tablet Take 50 mg by mouth at bedtime.    . traZODone (DESYREL) 50 MG tablet Take 1 tablet (50 mg total) by mouth at bedtime. 60 tablet 0  . Vitamin D, Ergocalciferol, (DRISDOL) 50000 units CAPS capsule Take 50,000 Units by mouth every Monday.   1  . clonazePAM (KLONOPIN) 1 MG tablet Take 1 tablet (1 mg total) by mouth 3 (three) times daily as needed for anxiety. (Patient not taking: Reported on 05/07/2018) 10 tablet 0  . hydrOXYzine (ATARAX/VISTARIL) 25 MG tablet Take 1 tablet (25 mg total) by mouth at bedtime. (Patient not taking: Reported on 05/07/2018) 30 tablet 0  . linaclotide (LINZESS) 72 MCG capsule Take 1 capsule (72 mcg total) by mouth daily before breakfast. (Patient not taking: Reported on 05/07/2018) 30 capsule 3  . loperamide (IMODIUM A-D) 2 MG tablet Use as needed (Patient not taking: Reported on 05/07/2018) 30 tablet 0  . methimazole (TAPAZOLE) 10 MG tablet Take 1 tablet (10 mg total) by mouth daily. (Patient not taking: Reported on 05/07/2018) 30 tablet 0    Objective: BP 122/73   Pulse 91   Temp 98.4 F (36.9 C) (Oral)   Resp 16   SpO2 100%  Exam: General: awake and alert, laying in bed, NAD Eyes: PERRL ENTM: moist mucous membranes  Neck: supple Cardiovascular: RRR, no MRG Respiratory: CTAB, no wheezes, rales, or rhonchi, speaking full sentences, no  increased WOB Gastrointestinal: soft, non tender, non distended, bowel sounds normal  MSK: no edema, no erythema, refusing knee ROM testing 2/2 pain. No patellar grind, actively resisting passive ROM  Derm: soft, non tender, non distended, bowel sounds normal  Neuro: no focal deficit  Psych: normal affect   Labs and Imaging: CBC BMET  Recent Labs  Lab 05/07/18 0015  WBC 9.4  HGB 10.3*  HCT 33.4*  PLT 357   Recent Labs  Lab 05/07/18 0015  NA 141  K 4.1  CL 108  CO2 22  BUN 33*  CREATININE 0.92  GLUCOSE 99  CALCIUM 10.0       Ref. Range 05/07/2018 00:15  TSH Latest Ref Range: 0.350 - 4.500 uIU/mL <0.010 (L)  T4,Free(Direct) Latest Ref Range: 0.82 - 1.77 ng/dL 4.09 (H)   Nm Thyroid Mult Uptake W/imaging  Result Date: 04/30/2018 CLINICAL DATA:  TSH equal 0.006. Symptoms of hyperthyroidism: Weight loss, diarrhea, difficulty sleeping, tremors, hair loss, mood in his, increased thirst, weakness and dry skin. EXAM: THYROID SCAN AND UPTAKE - 4 AND 24 HOURS TECHNIQUE: Following oral administration of I-123 capsule, anterior planar imaging was acquired at 24 hours. Thyroid uptake was calculated with a thyroid probe at 4-6 hours and 24 hours. RADIOPHARMACEUTICALS:  Four hundred fifty-one uCi I-123 sodium iodide p.o. COMPARISON:  None. FINDINGS: Relative uniform uptake within both lobes of the thyroid gland. The right lobe is larger than the left lobe. No dominant hot or cold nodules identified. 6 hour I-123 uptake = 33.3% (normal 5-20%) 24 hour I-123 uptake = 51.4% (normal 10-30%) IMPRESSION: 1. Elevated 4 hour and 24 hour radioactive iodine uptake within a centrally normal thyroid scan. Findings compatible with Graves disease, which may be amendable to therapy with radial labeled iodine. Please select correct template: Electronically Signed   By: Veronda Prude.D.  On: 04/30/2018 07:51   Dg Knee Complete 4 Views Left  Result Date: 04/11/2018 CLINICAL DATA:  Left knee pain and  swelling.  No known injury. EXAM: LEFT KNEE - COMPLETE 4+ VIEW COMPARISON:  None. FINDINGS: No fracture or dislocation. Moderate tricompartmental osteoarthritis with peripheral spurring. Mild medial tibiofemoral joint space narrowing. No bony destructive change. No significant joint effusion. No focal soft tissue abnormality. IMPRESSION: Moderate tricompartmental osteoarthritis without acute osseous abnormality. Electronically Signed   By: Narda Rutherford M.D.   On: 04/11/2018 23:04   Dg Hip Unilat W Or Wo Pelvis 2-3 Views Left  Result Date: 05/06/2018 CLINICAL DATA:  Left hip pain more than usual. EXAM: DG HIP (WITH OR WITHOUT PELVIS) 2-3V LEFT COMPARISON:  07/06/2015 FINDINGS: There is lumbar facet arthropathy of the included lower lumbar spine from L4 through S1. No pelvic diastasis or fracture. No significant joint space narrowing of the included hips. No acute fracture or bone destruction. IMPRESSION: 1. Degenerative facet arthropathy of the included lumbar spine. 2. No acute displaced fracture of the left hip nor suspicious/aggressive osseous lesions. 3. No pelvic diastasis or fracture. Electronically Signed   By: Tollie Eth M.D.   On: 05/06/2018 15:01     Oralia Manis, DO 05/07/2018, 2:07 AM PGY-2, [facility] FPTS Intern pager: [phone], text pages welcome

## 2018-05-07 NOTE — Progress Notes (Signed)
FPTS Interim Progress Note  S: Patient complains of dysuria and malodorous urine. Knee is very painful still. Lidocaine patch helped. Tylenol does not help. Baseline pain is 8/10. Pain prior to admission had gotten to a 10/10, but current pain today is 8/10.  O: BP 133/81 (BP Location: Right Arm)   Pulse 89   Temp 98.1 F (36.7 C) (Oral)   Resp 19   SpO2 100%   CV: RRR, no m/r/g, 2+ radial pulses b/l, trace LE edema Lungs: CTAB, no increased WOB MSK: No swelling appreciated on left knee. No erythema or edema. Very tender to palpation along superior aspect of patella. ROM limited due to pain. Limited exam. No laxity noted with ligament testing.  A/P: 1. Dysuria:  Vitals stable and she is afebrile. CBC at admission is unremarkable, no leukocytosis. UA positive for nitrites. Urine culture pending.  - Will start Keflex 500mg  BID x 5 days  2. L Knee pain 2/2 worsening Osteoarthritis: Still painful. 4-view Knee-xray negative for fracture, dislocation or joint effusion. Diffuse osteoarthritic change in all compartments. With exam, does not appear to be infectious. Ligaments appear to be intact. Most likely due to worsening osteoarthritic pain at this time, possibly meniscus tear.  - Continue Lidocaine patch and tylenol PRN - Will add Voltaren gel and k-pad  3. Social Concerns: Patient lives with 8 steps up to her apartment. Her daughter lives with stairs as well. She notes she will not be able to go up the stairs upon discharge. She does have a son in [place]. She is going to reach out to him about potential short term stay with him. 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 Daughter would help with her rent so she wouldn't lose her home in the mean time. - Consult social Work for [employer], Dara Lords, DO 05/07/2018, 9:57 AM PGY-1, [facility] Family Medicine Service pager [phone]

## 2018-05-07 NOTE — Evaluation (Signed)
Physical Therapy Evaluation Patient Details Name: Yesenia [name] MRN: [id number] DOB: April 08, 1961 Today's Date: 05/07/2018   History of Present Illness  Minah Axelrod is a 57 y.o. female with past medical history significant for but not limited to bilateral knee osteoarthritis, morbid obesity, hypertension, hyperlipidemia, chronic anemia who presented to ER for evaluation of left knee pain. x rays negative for fracture or acute injury.     Clinical Impression  Pt admitted with above diagnosis. Pt currently with functional limitations due to the deficits listed below (see PT Problem List). Pt will benefit from skilled PT to increase her independence and safety with mobility. With pts current level of mobility she would not be able to care for herself at home. She has an 8 step entry and it does not seem likely than in the short term that she would be able to safely get up and down these stairs. I discussed with pt that likely in the future it will only become more difficult to get up these stairs and that an apartment on the ground level with no steps to get in would be ideal.   Follow Up Recommendations Supervision for mobility/OOB;SNF;    Equipment Recommendations  None recommended by PT    Recommendations for Other Services       Precautions / Restrictions Precautions Precautions: Fall Restrictions Weight Bearing Restrictions: No      Mobility  Bed Mobility Overal bed mobility: Needs Assistance Bed Mobility: Supine to Sit;Sit to Supine     Supine to sit: Supervision Sit to supine: Min assist   General bed mobility comments: Needed assist to get LLE back into bed  Transfers Overall transfer level: Needs assistance Equipment used: Rolling walker (2 wheeled) Transfers: Sit to/from Stand Sit to Stand: +2 physical assistance;Mod assist(Performed 3 attempts of sit to stand. Rerquired +2 assist. )            Ambulation/Gait Ambulation/Gait assistance: (Unable. Stood 3 times  for no more than 10 seconds at a time.)              Information systems manager [employer] (Stroke Patients Only)       Balance Overall balance assessment: Needs assistance         Standing balance support: Bilateral upper extremity supported Standing balance-Leahy Scale: Zero Standing balance comment: Pt very shaky on feet.                              Pertinent Vitals/Pain Pain Assessment: 0-10 Pain Score: 9  Pain Location: left knee Pain Descriptors / Indicators: Grimacing;Guarding Pain Intervention(s): Monitored during session;Limited activity within patient's tolerance;RN gave pain meds during session(RN gave pain meds at start of session)    Home Living Family/patient expects to be discharged to:: Unsure Living Arrangements: Alone Available Help at Discharge: Family;Available PRN/intermittently Type of Home: Apartment Home Access: Stairs to enter Entrance Stairs-Rails: Right(Pt reports rail is broken and not safe to use) Entrance Stairs-Number of Steps: 9 Home Layout: One level Home Equipment: Walker - 2 wheels;Cane - quad      Prior Function Level of Independence: Independent with assistive device(s)(reported needing to ascend stairs backwards due to knee pain)         Comments: used [facility] primarily when in house     Hand Dominance   Dominant Hand: Left    Extremity/Trunk Assessment  Upper Extremity Assessment Upper Extremity Assessment: Overall WFL for tasks assessed    Lower Extremity Assessment Lower Extremity Assessment: LLE deficits/detail LLE Deficits / Details: very limited ROM, both acitve and passive LLE: Unable to fully assess due to pain    Cervical / Trunk Assessment Cervical / Trunk Assessment: Normal  Communication   Communication: No difficulties  Cognition Arousal/Alertness: Awake/alert Behavior During Therapy: Anxious Overall Cognitive Status: Within Functional Limits for tasks  assessed                                        General Comments General comments (skin integrity, edema, etc.): No family present. Pt concerned that her recent injection has negatively impacted her knee.      Exercises     Assessment/Plan    PT Assessment Patient needs continued PT services  PT Problem List Decreased strength;Decreased range of motion;Decreased activity tolerance;Decreased balance;Decreased mobility;Decreased knowledge of use of DME;Pain       PT Treatment Interventions DME instruction;Gait training;Stair training;Therapeutic exercise;Balance training;Patient/family education    PT Goals (Current goals can be found in the Care Plan section)  Acute Rehab PT Goals Patient Stated Goal: to be able to return home PT Goal Formulation: With patient Time For Goal Achievement: 05/14/18 Potential to Achieve Goals: Good    Frequency Min 3X/week   Barriers to discharge Inaccessible home environment      Co-evaluation               AM-PAC PT "6 Clicks" Daily Activity  Outcome Measure Difficulty turning over in bed (including adjusting bedclothes, sheets and blankets)?: Unable Difficulty moving from lying on back to sitting on the side of the bed? : Unable Difficulty sitting down on and standing up from a chair with arms (e.g., wheelchair, bedside commode, etc,.)?: Unable Help needed moving to and from a bed to chair (including a wheelchair)?: Total Help needed walking in hospital room?: Total Help needed climbing 3-5 steps with a railing? : Total 6 Click Score: 6    End of Session Equipment Utilized During Treatment: Gait belt Activity Tolerance: Patient limited by pain Patient left: in bed;with call bell/phone within reach Nurse Communication: Mobility status PT Visit Diagnosis: Unsteadiness on feet (R26.81);Pain Pain - Right/Left: Left Pain - part of body: Knee    Time: 1012-1044 PT Time Calculation (min) (ACUTE ONLY): 32  min   Charges:   PT Evaluation $PT Eval Moderate Complexity: 1 Mod          Lavona Mound, PT   Acute Rehabilitation Services  Pager [phone] Office [phone] 05/07/2018   Donnella Sham 05/07/2018, 1:26 PM

## 2018-05-07 NOTE — Progress Notes (Signed)
Patient complain of burning sensation when voiding, foul smell noted in urine. Md made aware gave new order

## 2018-05-08 ENCOUNTER — Ambulatory Visit (HOSPITAL_COMMUNITY): Payer: Medicaid Other

## 2018-05-08 DIAGNOSIS — M25561 Pain in right knee: Secondary | ICD-10-CM | POA: Diagnosis not present

## 2018-05-08 DIAGNOSIS — R52 Pain, unspecified: Secondary | ICD-10-CM

## 2018-05-08 DIAGNOSIS — R159 Full incontinence of feces: Secondary | ICD-10-CM

## 2018-05-08 DIAGNOSIS — M25562 Pain in left knee: Secondary | ICD-10-CM | POA: Diagnosis not present

## 2018-05-08 LAB — T3: T3 TOTAL: 348 ng/dL — AB (ref 71–180)

## 2018-05-08 MED ORDER — LORAZEPAM 2 MG/ML IJ SOLN
1.0000 mg | Freq: Once | INTRAMUSCULAR | Status: AC | PRN
  Administered 2018-05-08: 1 mg via INTRAVENOUS
  Filled 2018-05-08: qty 1

## 2018-05-08 MED ORDER — INFLUENZA VAC SPLIT QUAD 0.5 ML IM SUSY: 0.5000 mL | PREFILLED_SYRINGE | INTRAMUSCULAR | Status: DC | PRN

## 2018-05-08 MED ORDER — GADOBUTROL 1 MMOL/ML IV SOLN
10.0000 mL | Freq: Once | INTRAVENOUS | Status: AC | PRN
  Administered 2018-05-08: 10 mL via INTRAVENOUS

## 2018-05-08 NOTE — Procedures (Signed)
EMG & NCV Findings: Evaluation of the left ulnar motor nerve showed decreased conduction velocity (B Elbow-Wrist, 44 m/s).  The left ulnar sensory nerve showed prolonged distal peak latency (3.8 ms), reduced amplitude (10.9 V), and decreased conduction velocity (Wrist-5th Digit, 37 m/s).  All remaining nerves (as indicated in the following tables) were within normal limits.    All examined muscles (as indicated in the following table) showed no evidence of electrical instability.    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  While the ulnar nerve entrapment is more common at the cubital tunnel this does appear to be distal.  There is slowing both on distal sensory conduction as well as distal to the elbow on the motor conduction.  There is no significant electrodiagnostic evidence of any other focal nerve entrapment brachial plexopathy or cervical radiculopathy.  There is no evidence suggestive of polyneuropathy.  Recommendations: 1.  Follow-up with referring physician. 2.  Continue current management of symptoms.  ___________________________ Naaman Plummer FAAPMR Board Certified, American Board of Physical Medicine and Rehabilitation    Nerve Conduction Studies Anti Sensory Summary Table   Stim Site NR Peak (ms) Norm Peak (ms) P-T Amp (V) Norm P-T Amp Site1 Site2 Delta-P (ms) Dist (cm) Vel (m/s) Norm Vel (m/s)  Left Median Acr Palm Anti Sensory (2nd Digit)  32.1C  Wrist    3.4 <3.6 17.2 >10 Wrist Palm 1.5 0.0    Palm    1.9 <2.0 16.1         Left Radial Anti Sensory (Base 1st Digit)  33C  Wrist    2.0 <3.1 29.5  Wrist Base 1st Digit 2.0 0.0    Left Ulnar Anti Sensory (5th Digit)  32.6C  Wrist    *3.8 <3.7 *10.9 >15.0 Wrist 5th Digit 3.8 14.0 *37 >38   Motor Summary Table   Stim Site NR Onset (ms) Norm Onset (ms) O-P Amp (mV) Norm O-P  Amp Site1 Site2 Delta-0 (ms) Dist (cm) Vel (m/s) Norm Vel (m/s)  Left Median Motor (Abd Poll Brev)  32.6C  Wrist    3.3 <4.2 7.8 >5 Elbow Wrist 4.2 24.5 58 >50  Elbow    7.5  7.6         Left Ulnar Motor (Abd Dig Min)  32.2C  Wrist    3.0 <4.2 7.0 >3 B Elbow Wrist 4.7 20.5 *44 >53  B Elbow    7.7  5.0  A Elbow B Elbow 1.9 10.0 53 >53  A Elbow    9.6  3.8          EMG   Side Muscle Nerve Root Ins Act Fibs Psw Amp Dur Poly Recrt Int Dennie Bible Comment  Left Abd Poll Brev Median C8-T1 Nml Nml Nml Nml Nml 0 Nml Nml   Left 1stDorInt Ulnar C8-T1 Nml Nml Nml Nml Nml 0 Nml Nml   Left PronatorTeres Median C6-7 Nml Nml Nml Nml Nml 0 Nml Nml   Left Biceps Musculocut C5-6 Nml Nml Nml Nml Nml 0 Nml Nml   Left Deltoid Axillary C5-6 Nml Nml Nml Nml Nml 0 Nml Nml     Nerve Conduction Studies Anti Sensory Left/Right Comparison   Stim Site L Lat (ms) R Lat (ms) L-R Lat (ms) L Amp (V) R Amp (V) L-R Amp (%) Site1 Site2 L Vel (m/s) R Vel (m/s) L-R Vel (m/s)  Median Acr [employer]  Anti Sensory (2nd Digit)  32.1C  Wrist 3.4   17.2   Wrist Palm     Palm 1.9   16.1         Radial Anti Sensory (Base 1st Digit)  33C  Wrist 2.0   29.5   Wrist Base 1st Digit     Ulnar Anti Sensory (5th Digit)  32.6C  Wrist *3.8   *10.9   Wrist 5th Digit *37     Motor Left/Right Comparison   Stim Site L Lat (ms) R Lat (ms) L-R Lat (ms) L Amp (mV) R Amp (mV) L-R Amp (%) Site1 Site2 L Vel (m/s) R Vel (m/s) L-R Vel (m/s)  Median Motor (Abd Poll Brev)  32.6C  Wrist 3.3   7.8   Elbow Wrist 58    Elbow 7.5   7.6         Ulnar Motor (Abd Dig Min)  32.2C  Wrist 3.0   7.0   B Elbow Wrist *44    B Elbow 7.7   5.0   A Elbow B Elbow 53    A Elbow 9.6   3.8            Waveforms:

## 2018-05-08 NOTE — Social Work (Signed)
Spoke with pt at bedside, pt states she has been to [employer] before (now [place]) she did not have to turn her check over there. CSW f/u with [facility] admissions liaison Tammy. Tammy will call pt and discuss finances and further information. Will continue to follow.   Octavio Graves, MSW, [facility] Clinical Social Work [phone]

## 2018-05-08 NOTE — Progress Notes (Addendum)
Yesenia [name] - 57 y.o. female MRN [id number]  Date of birth: Jul 13, 1960  Office Visit Note: Visit Date: 05/02/2018 PCP: Melene Plan, MD Referred by: Freddrick March, MD  Subjective: Chief Complaint  Patient presents with  . Right Arm - Pain, Numbness, Tingling  . Right Hand - Tingling, Pain, Numbness   HPI: Yesenia [name] is a 57 y.o. female who comes in today For electrodiagnostic study of the right upper limb at the request of Dr. Burnard Bunting.  She reports 9 months of chronic worsening severe at times pain numbness and tingling in the right arm and right hand particularly in the fourth and fifth digits.  Symptoms are mainly in the fifth digit.  She denies any symptoms on the left.  She reports trying to hold anything or use her hand makes his symptoms worse.  Nothing seems to help with symptoms otherwise.  She is taking gabapentin as well as tramadol.  She denies any frank radicular type pain.  She has had no prior cervical surgery.  She has complicated pain history with intolerance to buprenorphine and morphine.  Her case is complicated by history of type 2 diabetes but she is not insulin-dependent.  No prior diagnosis of polyneuropathy.  Documented hemoglobin A1c around 6.  ROS Otherwise per HPI.  Assessment & Plan: Visit Diagnoses:  1. 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   There is no significant electrodiagnostic evidence of any other focal nerve entrapment brachial plexopathy or cervical radiculopathy.   Recommendations: 1.  Follow-up with referring physician. 2.  Continue current management of symptoms.   Meds & Orders: No orders of the defined types were placed in this  encounter.   Orders Placed This Encounter  Procedures  . NCV with EMG (electromyography)    Follow-up: Return for  Burnard Bunting, M.D..   Procedures: No procedures performed  EMG & NCV Findings: Evaluation of the right ulnar motor nerve showed decreased conduction velocity (B Elbow-Wrist, 44 m/s).  The right ulnar sensory nerve showed prolonged distal peak latency (3.8 ms), reduced amplitude (10.9 V), and decreased conduction velocity (Wrist-5th Digit, 37 m/s).  All remaining nerves (as indicated in the following tables) were within normal limits.    All examined muscles (as indicated in the following table) showed no evidence of electrical instability.    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   There is no significant electrodiagnostic evidence of any other focal nerve entrapment brachial plexopathy or cervical radiculopathy.   Recommendations: 1.  Follow-up with referring physician. 2.  Continue current management of symptoms.  ___________________________ Naaman Plummer FAAPMR Board Certified, American Board of Physical Medicine and Rehabilitation    Nerve Conduction Studies Anti Sensory Summary Table   Stim Site NR Peak (ms) Norm Peak (ms) P-T Amp (V) Norm P-T Amp Site1 Site2 Delta-P (ms) Dist (cm) Vel (m/s) Norm Vel (m/s)  Right Median Acr Palm Anti Sensory (2nd Digit)  32.1C  Wrist    3.4 <3.6 17.2 >10 Wrist Palm 1.5 0.0    Palm    1.9 <2.0 16.1  Right Radial Anti Sensory (Base 1st Digit)  33C  Wrist    2.0 <3.1 29.5  Wrist Base 1st Digit 2.0 0.0    Right Ulnar Anti Sensory (5th Digit)  32.6C  Wrist    *3.8 <3.7 *10.9 >15.0 Wrist 5th Digit 3.8 14.0 *37 >38   Motor Summary Table   Stim Site NR Onset (ms) Norm Onset (ms) O-P Amp (mV) Norm O-P Amp Site1 Site2 Delta-0 (ms) Dist (cm) Vel (m/s) Norm Vel (m/s)    Right Median Motor (Abd Poll Brev)  32.6C  Wrist    3.3 <4.2 7.8 >5 Elbow Wrist 4.2 24.5 58 >50  Elbow    7.5  7.6         Right Ulnar Motor (Abd Dig Min)  32.2C  Wrist    3.0 <4.2 7.0 >3 B Elbow Wrist 4.7 20.5 *44 >53  B Elbow    7.7  5.0  A Elbow B Elbow 1.9 10.0 53 >53  A Elbow    9.6  3.8          EMG   Side Muscle Nerve Root Ins Act Fibs Psw Amp Dur Poly Recrt Int Dennie Bible Comment  Right Abd Poll Brev Median C8-T1 Nml Nml Nml Nml Nml 0 Nml Nml   Right 1stDorInt Ulnar C8-T1 Nml Nml Nml Nml Nml 0 Nml Nml   Right PronatorTeres Median C6-7 Nml Nml Nml Nml Nml 0 Nml Nml   Right Biceps Musculocut C5-6 Nml Nml Nml Nml Nml 0 Nml Nml   Right Deltoid Axillary C5-6 Nml Nml Nml Nml Nml 0 Nml Nml     Nerve Conduction Studies Anti Sensory Left/Right Comparison   Stim Site L Lat (ms) R Lat (ms) L-R Lat (ms) L Amp (V) R Amp (V) L-R Amp (%) Site1 Site2 L Vel (m/s) R Vel (m/s) L-R Vel (m/s)  Median Acr Palm Anti Sensory (2nd Digit)  32.1C  Wrist  3.4   17.2  Wrist Palm     Palm  1.9   16.1        Radial Anti Sensory (Base 1st Digit)  33C  Wrist  2.0   29.5  Wrist Base 1st Digit     Ulnar Anti Sensory (5th Digit)  32.6C  Wrist  *3.8   *10.9  Wrist 5th Digit  *37    Motor Left/Right Comparison   Stim Site L Lat (ms) R Lat (ms) L-R Lat (ms) L Amp (mV) R Amp (mV) L-R Amp (%) Site1 Site2 L Vel (m/s) R Vel (m/s) L-R Vel (m/s)  Median Motor (Abd Poll Brev)  32.6C  Wrist  3.3   7.8  Elbow Wrist  58   Elbow  7.5   7.6        Ulnar Motor (Abd Dig Min)  32.2C  Wrist  3.0   7.0  B Elbow Wrist  *44   B Elbow  7.7   5.0  A Elbow B Elbow  53   A Elbow  9.6   3.8           Waveforms:         Clinical History: No specialty comments available.   She reports that she has never smoked. She has never used smokeless tobacco.  Recent Labs    12/01/17 1453 01/09/18 1706 05/07/18 0216  HGBA1C 6.2 5.8 5.5    Objective:  VS:  HT:    WT:   BMI:     BP:   HR: bpm  TEMP: ( )  RESP:   Physical Exam  Constitutional: She is oriented to person, place, and time.  Musculoskeletal:  Inspection reveals no atrophy of the bilateral APB or FDI or hand intrinsics. There is no swelling, color changes, allodynia or dystrophic changes. There is 5 out of 5 strength in the bilateral wrist extension, finger abduction and long finger flexion.  There is decreased sensation in the right ulnar nerve. There is a positive Froment's test on the right.  She has a positive Wartenberg sign.  There is a negative Hoffmann's test bilaterally.  Neurological: She is alert and oriented to person, place, and time. She exhibits normal muscle tone. Coordination normal.  Skin: Skin is warm and dry. No erythema.    Ortho Exam Imaging: No results found.  Past Medical/Family/Surgical/Social History: Medications & Allergies reviewed per EMR, new medications updated. Patient Active Problem List   Diagnosis Date Noted  . Vitamin D deficiency 05/13/2018  . E. coli UTI 05/13/2018  . Full incontinence of feces   . Pain 05/07/2018  . Chronic pain of both knees   . Hyperthyroidism 04/20/2018  . Dysphagia 08/16/2017  . Nausea and vomiting   . MDD (major depressive disorder), recurrent severe, without psychosis (HCC)   . AKI (acute kidney injury) (HCC) 08/15/2017  . Acute on chronic respiratory failure with hypoxemia (HCC) 09/30/2016  . Irritable bowel syndrome 05/21/2016  . Fibromyalgia 02/22/2016  . CKD stage 3 due to type 2 diabetes mellitus (HCC) 02/16/2016  . OSA (obstructive sleep apnea) 02/13/2016  . GERD (gastroesophageal reflux disease) 07/10/2015  . Acute kidney injury superimposed on chronic kidney disease (HCC) 07/07/2015  . Morbid (severe) obesity due to excess calories (HCC) complicated by OSA 07/07/2015  . Type 2 diabetes mellitus with stage 3 chronic kidney disease, without long-term current use of insulin (HCC) 04/11/2013  . Dyslipidemia 04/11/2013  . Essential hypertension 01/14/2013    Past Medical History:  Diagnosis Date  . Abdominal pain, epigastric   . Abscess   . Acute stomach ulcer 08/15/2017  . AKI (acute kidney injury) (HCC)    Acute kidney injury on CKD stage II-III/notes 08/17/2017  . Anemia   . Anemia   . Arthritis    "knees" (08/17/2017)  . Bleeding 06/23/2016  . Blood transfusion without reported diagnosis 2012   anemia  . Cerebral infarction (HCC) 01/14/2013  . Chest pain 02/15/2013  . Chronic pain   . CKD (chronic kidney disease)    Stage 3  . Depression   . Fibromyalgia    hospitilized 12/16 due to inability to walk  . Gastric ulcer   . History of gastritis 2019  . Hyperlipemia   . Hypertension   . Insomnia   . Iron deficiency anemia 05/26/2016  . Neuropathy   . Right sided weakness   . Sleep apnea    "CPAPs didn't work for me; that's why they did OR" (08/17/2017)  . Stroke [facility]) 2014?   no residual weakness   . 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 Tremor 10/25/2017  . Type II diabetes mellitus (HCC)     does not check CBG's (08/17/2017), no meds  . Vitamin deficiency    Vit D   Family History  Problem Relation Age of Onset  . Breast cancer Mother   . Leukemia Brother   . Breast cancer Maternal Grandmother   . Colon cancer Neg Hx   . Esophageal cancer Neg Hx   . Rectal cancer Neg Hx   . Stomach cancer  Neg Hx    Past Surgical History:  Procedure Laterality Date  . ESOPHAGOGASTRODUODENOSCOPY (EGD) WITH PROPOFOL N/A 08/16/2017   Procedure: ESOPHAGOGASTRODUODENOSCOPY (EGD) WITH PROPOFOL;  Surgeon: Benancio Deeds, MD;  [facility] ENDOSCOPY;  Service: Gastroenterology;  Laterality: N/A;  . KNEE ARTHROPLASTY  1997   "stuck rods in it"  . KNEE ARTHROSCOPY Right 1992, 1995,  . OOPHORECTOMY Right 1993?  . TRACHEOSTOMY TUBE PLACEMENT N/A 02/13/2016   Procedure: TRACHEOSTOMY;  Surgeon: Christia Reading, MD;  Location: [facility] OR;  Service: ENT;  Laterality: N/A;  . TUBAL LIGATION     Social History   Occupational History  . Occupation: [employer]   Tobacco Use  . Smoking status: Never Smoker  . Smokeless tobacco: Never Used  Substance and Sexual Activity  . Alcohol use: No    Alcohol/week: 0.0 standard drinks  . Drug use: No  . Sexual activity: Never

## 2018-05-08 NOTE — Progress Notes (Signed)
Family Medicine Teaching Service Daily Progress Note Intern Pager: [phone]  Patient name: Yesenia [name] Medical record number: [id number] Date of birth: 04-19-1961 Age: 57 y.o. Gender: female  Primary Care Provider: Melene Plan, MD Consultants: None Code Status: Full code  Pt Overview and Major Events to Date:  05/06/2018 Admitted 05/08/2018 MRI lumbar spine Hospital Day: 3   Assessment and Plan: Yesenia [name] is a 57 y.o. female who presented with worsening left knee pain. PMHx is significant for HTN, T2DM, HLD, morbid obesity, GERD, OSA, CKD, Fibromyalgia, IBS, MDD, hyperthyriodism.  Fecal Incontinence: Patient noted fecal incontinence and bilateral weakness and numbness. Rectal exam performed by attending Dr. Manson Passey and will be noted in attestation. Due to concern for Cauda equina syndrome, will order MRI of lumbar spine. Will follow up - MRI lumbar spine  Dysuria 2/2 UTI: Patient started of Keflex 500mg  BID x 5 days (10/27-). Urine culture significant for E.Coli. Patient has remained hemodynamically stable and afebrile. Will continue current treatment pending and will await sensitivities. 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 Tender to palpation limiting exam. No signs of infection or effusion. Pain managed overnight with tylenol, lidocaine patch, Voltaren gel and heating pads. Patient instructed to see orthopedics outpatient for further management. PT consulted and recommend SNF placement. Social work consulted and placement pending discharge - Continue pain management as above  - PT consulted - recommend SNF - CSW consulted, placement arranged and pending discharge   Hyperthyroidism: TSH 0.010 and T4 2.76. T3 pending. Was recently diagnosed and started on Methimazole with recommendation to follow-up with endocrinology. Will continue Methimazole. - Continue  Methimazole  HTN:  Normotensive at 119/83.  Home meds:amlodipine 5mg , atenolol 100mg .  -continue home meds   T2DM: A1C 5.5. Not currently on medications.  - Continue to monitor  IBS: - Continue home Linzess  MDD: Home meds include Klonopin 0.5mg  TID PRN, trazodone 50mg  qHS, and Mirtazapine 15mg  qHS per chart review. Some confusion if patient is taking Klonopin or Clonidine. Will advice patient to discontinue either one at discharge. - continue home mirtazapine, trazodone  Fluids: Saline lock Electrolytes: None Nutrition: Heart healthy/carb modified GI ppx: Protonix DVT ppx: Lovenox  Future labs: None Disposition: SNF pending placement   Medications: Scheduled Meds: . amLODipine  5 mg Oral Daily  . atenolol  100 mg Oral Daily  . cephALEXin  500 mg Oral Q12H  . diclofenac sodium  4 g Topical QID  . enoxaparin (LOVENOX) injection  40 mg Subcutaneous Daily  . gabapentin  900 mg Oral QHS  . lidocaine  1 patch Transdermal Q24H  . methimazole  10 mg Oral Daily  . mirtazapine  15 mg Oral QHS  . pantoprazole  40 mg Oral BID  . traZODone  50 mg Oral QHS   Continuous Infusions: PRN Meds: acetaminophen **OR** acetaminophen, Influenza vac split quadrivalent PF, LORazepam, polyethylene glycol  ================================================= ================================================= Subjective:  Patient reports doing well overnight. Her pain was well managed with lidocaine patch. Endorsed some fecal incontinence. She also notes upon further questioning that she has bilateral LE numbness. She endorses chronic back pain. Pt has had good appetite and normal voids. Has constipation but notes history of IBS with diarrhea and constipation.   Objective: Vital Signs Temp:  [97.6 F (36.4 C)-98.4 F (36.9 C)] 97.6 F (36.4 C) (10/28 1411) Pulse Rate:  [80-86] 86 (10/28 1411) Resp:  [17-20] 19 (10/28 1411)  BP: (119-127)/(64-83) 124/64 (10/28 1411) SpO2:  [91 %-100 %] 91 %  (10/28 1411)  Intake/Output 10/27 0701 - 10/28 0700 In: 463 [P.O.:463] Out: 1500 [Urine:1500]  Physical Exam:  Gen: NAD, alert, non-toxic, well-appearing, sitting comfortably  Skin: Warm and dry. No obvious rashes, lesions, or trauma. HEENT: NCAT No conjunctival pallor or injection. No scleral icterus or injection.  MMM.  CV: RRR.  <2s capillary refill bilaterally.  RP & DPs 2+ bilaterally. No BLEE. Resp: CTAB.  No wheezing, rales, abnormal lung sounds.  No increased WOB Abd: NTND on palpation to all 4 quadrants.  Positive bowel sounds. Psych: Cooperative with exam. Pleasant. Makes eye contact. Speech normal. Extremities: Difficulty moving LE's  Rectal Exam: per Dr. Theora Gianotti attestation above  Laboratory: Recent Labs  Lab 05/07/18 0015  WBC 9.4  HGB 10.3*  HCT 33.4*  PLT 357   Recent Labs  Lab 05/07/18 0015  NA 141  K 4.1  CL 108  CO2 22  BUN 33*  CREATININE 0.92  CALCIUM 10.0  PROT 6.5  BILITOT 0.3  ALKPHOS 90  ALT 19  AST 16  GLUCOSE 99   Imaging/Diagnostic Tests: Dg Knee Complete 4 Views Left  Result Date: 05/07/2018 CLINICAL DATA:  Pain for several days EXAM: LEFT KNEE - COMPLETE 4+ VIEW COMPARISON:  April 11, 2018 FINDINGS: Frontal, lateral, and bilateral oblique views were obtained. There is no fracture or dislocation. There is no appreciable joint effusion. There is generalized joint space narrowing with spurring in all compartments. No erosive changes. IMPRESSION: Diffuse osteoarthritic change in all compartments. No fracture or joint effusion evident. Electronically Signed   By: Bretta Bang III M.D.   On: 05/07/2018 02:32   Joana Reamer, DO 05/08/2018, 5:02 PM PGY-1, [facility] Family Medicine FPTS Intern pager: [phone], text pages welcome

## 2018-05-08 NOTE — Progress Notes (Signed)
Physical Therapy Treatment Patient Details Name: Yesenia [name] MRN: [id number] DOB: Jun 07, 1961 Today's Date: 05/08/2018    History of Present Illness Yesenia [name] is a 57 y.o. female with past medical history significant for but not limited to bilateral knee osteoarthritis, morbid obesity, hypertension, hyperlipidemia, chronic anemia who presented to ER for evaluation of left knee pain. x rays negative for fracture or acute injury.       PT Comments    Patient seen for mobility progression. Pt is making gradual progress toward PT goals and able to ambulate ~79ft X 2 trials and mod A +2 for safety. Pt requires max A +2 for sit to stand transfers from EOB and recliner. Continue to progress as tolerated with anticipated d/c to SNF for further skilled PT services.    Follow Up Recommendations  Supervision for mobility/OOB;SNF     Equipment Recommendations  None recommended by PT    Recommendations for Other Services       Precautions / Restrictions Precautions Precautions: Fall Restrictions Weight Bearing Restrictions: No    Mobility  Bed Mobility Overal bed mobility: Needs Assistance Bed Mobility: Supine to Sit     Supine to sit: Supervision     General bed mobility comments: supervision for safety and increased time and effort   Transfers Overall transfer level: Needs assistance Equipment used: Rolling walker (2 wheeled) Transfers: Sit to/from Stand Sit to Stand: +2 physical assistance;Max assist         General transfer comment: +2 assist to power up into standing and to gain balance upon standing; pt with jerky movements upon standing that subsided prior to ambulating; cues for posture and safe use of AD   Ambulation/Gait Ambulation/Gait assistance: Mod assist;+2 safety/equipment Gait Distance (Feet): (5ft X2 trials) Assistive device: Rolling walker (2 wheeled) Gait Pattern/deviations: Step-to pattern;Decreased step length - right;Decreased step length -  left;Decreased dorsiflexion - right;Decreased dorsiflexion - left;Trunk flexed Gait velocity: slow   General Gait Details: cues for posture, proximity to RW, and increased bilat step lengths; assist for balance and guiding RW; heavy reliance on bilat UE support    Stairs             Wheelchair Mobility    Modified Rankin (Stroke Patients Only)       Balance Overall balance assessment: Needs assistance Sitting-balance support: No upper extremity supported Sitting balance-Leahy Scale: Good     Standing balance support: Bilateral upper extremity supported Standing balance-Leahy Scale: Poor Standing balance comment: Pt very shaky on feet.                             Cognition Arousal/Alertness: Awake/alert Behavior During Therapy: Anxious Overall Cognitive Status: Within Functional Limits for tasks assessed                                 General Comments: Requiring increased time and cues throughout session      Exercises      General Comments        Pertinent Vitals/Pain Pain Assessment: 0-10 Pain Score: 7  Pain Location: left knee Pain Descriptors / Indicators: Grimacing;Guarding Pain Intervention(s): Limited activity within patient's tolerance;Monitored during session;Repositioned    Home Living Family/patient expects to be discharged to:: Private residence Living Arrangements: Alone Available Help at Discharge: Family;Available PRN/intermittently Type of Home: Apartment Home Access: Stairs to enter Entrance Stairs-Rails: Right(Pt reports rail is broken and  not safe to use) Home Layout: One level Home Equipment: Walker - 2 wheels;Cane - quad;Shower seat;Toilet riser      Prior Function Level of Independence: Independent with assistive device(s)      Comments: Using RW in last week due to L knee pain   PT Goals (current goals can now be found in the care plan section) Acute Rehab PT Goals Patient Stated Goal: to be able  to return home Progress towards PT goals: Progressing toward goals    Frequency    Min 3X/week      PT Plan Current plan remains appropriate    Co-evaluation PT/OT/SLP Co-Evaluation/Treatment: Yes Reason for Co-Treatment: Necessary to address cognition/behavior during functional activity;For patient/therapist safety;To address functional/ADL transfers PT goals addressed during session: Mobility/safety with mobility;Balance;Proper use of DME;Strengthening/ROM OT goals addressed during session: ADL's and self-care      AM-PAC PT "6 Clicks" Daily Activity  Outcome Measure  Difficulty turning over in bed (including adjusting bedclothes, sheets and blankets)?: Unable Difficulty moving from lying on back to sitting on the side of the bed? : A Lot Difficulty sitting down on and standing up from a chair with arms (e.g., wheelchair, bedside commode, etc,.)?: Unable Help needed moving to and from a bed to chair (including a wheelchair)?: A Lot Help needed walking in hospital room?: A Lot Help needed climbing 3-5 steps with a railing? : Total 6 Click Score: 9    End of Session Equipment Utilized During Treatment: Gait belt Activity Tolerance: Patient limited by fatigue Patient left: in chair;with call bell/phone within reach Nurse Communication: Mobility status PT Visit Diagnosis: Unsteadiness on feet (R26.81);Pain Pain - Right/Left: Left Pain - part of body: Knee     Time: 1610-9604 PT Time Calculation (min) (ACUTE ONLY): 28 min  Charges:  $Gait Training: 8-22 mins                     Erline Levine, PTA Acute Rehabilitation Services Pager: [phone] Office: [phone]     Carolynne Edouard 05/08/2018, 1:33 PM

## 2018-05-08 NOTE — Evaluation (Signed)
Occupational Therapy Evaluation Patient Details Name: Yesenia [name] MRN: [id number] DOB: 06/02/1961 Today's Date: 05/08/2018    History of Present Illness Yesenia [name] is a 57 y.o. female with past medical history significant for but not limited to bilateral knee osteoarthritis, morbid obesity, hypertension, hyperlipidemia, chronic anemia who presented to ER for evaluation of left knee pain. x rays negative for fracture or acute injury.      Clinical Impression   PTA, pt was living alone and was independent. Currently, pt requires Min A for UB ADLs, Max A for LB ADLs, and Max A +2 for functional mobility using RW. Pt presenting with decreased strength, balance, and activity tolerance. During transition from sitting>standing pt with jerky movements and required increased time to achieve upright posture. Pt would benefit from further acute OT to facilitate safe dc. Recommend dc to SNF for further OT to optimize safety, independence with ADLs, and return to PLOF.      Follow Up Recommendations  SNF;Supervision/Assistance - 24 hour    Equipment Recommendations  Other (comment)(Defer to next venue)    Recommendations for Other Services PT consult     Precautions / Restrictions Precautions Precautions: Fall Restrictions Weight Bearing Restrictions: No      Mobility Bed Mobility Overal bed mobility: Needs Assistance Bed Mobility: Supine to Sit;Sit to Supine     Supine to sit: Supervision Sit to supine: Min assist   General bed mobility comments: Needed assist to get LLE back into bed  Transfers Overall transfer level: Needs assistance Equipment used: Rolling walker (2 wheeled) Transfers: Sit to/from Stand Sit to Stand: +2 physical assistance;Mod assist(Performed 3 attempts of sit to stand. Rerquired +2 assist. )         General transfer comment: Max A for power up into standing. During sit<>Stand, pt presenting with jerky movements. Requiring increased assistance to achieve  standing posture     Balance Overall balance assessment: Needs assistance Sitting-balance support: No upper extremity supported;Feet supported Sitting balance-Leahy Scale: Good     Standing balance support: Bilateral upper extremity supported Standing balance-Leahy Scale: Zero Standing balance comment: Pt very shaky on feet.                            ADL either performed or assessed with clinical judgement   ADL Overall ADL's : Needs assistance/impaired Eating/Feeding: Independent;Sitting   Grooming: Set up;Supervision/safety;Sitting   Upper Body Bathing: Minimal assistance;Sitting   Lower Body Bathing: Maximal assistance;+2 for physical assistance;Sit to/from stand   Upper Body Dressing : Minimal assistance;Sitting   Lower Body Dressing: Maximal assistance;+2 for physical assistance;Sit to/from stand   Toilet Transfer: Maximal assistance;+2 for physical assistance(simulated to recliner) Toilet Transfer Details (indicate cue type and reason): Max A +2 for sit<>stand. Pt then requiring Min A for standing balance during mobility.         Functional mobility during ADLs: Maximal assistance;+2 for physical assistance General ADL Comments: Pt with decreased functional performance and reporting pain in her L knee. Pt with difficulty in sit<>Stand.     Vision         Perception     Praxis      Pertinent Vitals/Pain Pain Assessment: 0-10 Pain Score: 7  Pain Location: left knee Pain Descriptors / Indicators: Grimacing;Guarding Pain Intervention(s): Monitored during session;Limited activity within patient's tolerance;Repositioned     Hand Dominance Left   Extremity/Trunk Assessment Upper Extremity Assessment Upper Extremity Assessment: Overall WFL for tasks assessed   Lower  Extremity Assessment Lower Extremity Assessment: Defer to PT evaluation LLE Deficits / Details: very limited ROM, both acitve and passive LLE: Unable to fully assess due to pain    Cervical / Trunk Assessment Cervical / Trunk Assessment: Normal   Communication Communication Communication: No difficulties   Cognition Arousal/Alertness: Awake/alert Behavior During Therapy: Anxious Overall Cognitive Status: Within Functional Limits for tasks assessed                                 General Comments: Requiring increased time and cues throughout session   General Comments       Exercises     Shoulder Instructions      Home Living Family/patient expects to be discharged to:: Private residence Living Arrangements: Alone Available Help at Discharge: Family;Available PRN/intermittently Type of Home: Apartment Home Access: Stairs to enter Entrance Stairs-Number of Steps: 9 Entrance Stairs-Rails: Right(Pt reports rail is broken and not safe to use) Home Layout: One level     Bathroom Shower/Tub: Chief Strategy Officer: Standard(toilet riser over)     Home Equipment: Environmental consultant - 2 wheels;Cane - quad;Shower seat;Toilet riser          Prior Functioning/Environment Level of Independence: Independent with assistive device(s)        Comments: Using RW in last week due to L knee pain        OT Problem List: Decreased strength;Decreased range of motion;Decreased activity tolerance;Impaired balance (sitting and/or standing);Decreased knowledge of use of DME or AE;Decreased knowledge of precautions;Pain;Obesity      OT Treatment/Interventions: Self-care/ADL training;Therapeutic exercise;Energy conservation;DME and/or AE instruction;Therapeutic activities;Patient/family education    OT Goals(Current goals can be found in the care plan section) Acute Rehab OT Goals Patient Stated Goal: to be able to return home OT Goal Formulation: With patient Time For Goal Achievement: 05/22/18 Potential to Achieve Goals: Good  OT Frequency: Min 2X/week   Barriers to D/C:            Co-evaluation PT/OT/SLP Co-Evaluation/Treatment:  Yes Reason for Co-Treatment: Necessary to address cognition/behavior during functional activity;For patient/therapist safety;To address functional/ADL transfers PT goals addressed during session: Mobility/safety with mobility;Balance;Proper use of DME;Strengthening/ROM OT goals addressed during session: ADL's and self-care      AM-PAC PT "6 Clicks" Daily Activity     Outcome Measure Help from another person eating meals?: None Help from another person taking care of personal grooming?: None Help from another person toileting, which includes using toliet, bedpan, or urinal?: A Lot Help from another person bathing (including washing, rinsing, drying)?: A Lot Help from another person to put on and taking off regular upper body clothing?: A Little Help from another person to put on and taking off regular lower body clothing?: A Lot 6 Click Score: 17   End of Session Equipment Utilized During Treatment: Gait belt;Rolling walker Nurse Communication: Mobility status  Activity Tolerance: Patient tolerated treatment well Patient left: in chair;with call bell/phone within reach  OT Visit Diagnosis: Unsteadiness on feet (R26.81);Other abnormalities of gait and mobility (R26.89);Muscle weakness (generalized) (M62.81);Pain Pain - Right/Left: Left Pain - part of body: Leg;Knee                Time: 1610-9604 OT Time Calculation (min): 24 min Charges:  OT General Charges $OT Visit: 1 Visit OT Evaluation $OT Eval Moderate Complexity: 1 Mod  Yesenia [name] MSOT, OTR/L Acute Rehab Pager: [phone] Office: [phone]  Theodoro Grist Yesenia [name] 05/08/2018,  1:08 PM

## 2018-05-08 NOTE — Social Work (Signed)
Pt able to d/c to SNF; has selected [facility].  Per attending MD pt awaiting an MRI. Unable to d/c until that is completed. Octavio Graves, MSW, [facility] Clinical Social Work [phone]

## 2018-05-08 NOTE — Discharge Summary (Addendum)
FMTS Attending Daily Note: Yesenia Starr, MD  Pager [phone]  Office [phone] I have seen and examined this patient, reviewed their chart. I have discussed this patient with the resident. I agree with the resident's findings, assessment and care plan.  Left Leg Pain this is likely multifactorial in nature.  The patient recently did have joint injections.  Infectious arthritis was considered however the joint had no signs suggestive of infection such as erythema or warmth.  Additionally all labs are within normal limits.  On exam most of the patient's pain appeared to come from her hip or back.  Her leg pain appears to radiate all the way down from her low back through to her knee and lower extremity.  As such in complaints with her fecal incontinence an MRI of the back was obtained.  This was negative for any acute pathology.  The patient reports this is an ongoing issue whereby she has not been able to walk up her stairs in the past before due to right leg pain.  Reviewed reasons to return to care.  Reviewed that no identifiable joint effusion was appreciated and thus sampling was not obtained.  But if she were to develop signs or symptoms of infection she is to return immediately.   Family Medicine Teaching [facility] Discharge Summary  Patient name: Yesenia [name] Medical record number: [id number] Date of birth: [birth year] Age: 57 y.o. Gender: female Date of Admission: 05/06/2018  Date of Discharge: 05/09/2018 Admitting Physician: Westley Chandler, MD  Primary Care Provider: Melene Plan, MD Consultants: None  Indication for Hospitalization: left knee pain  Discharge Diagnoses/Problem List:  Patient Active Problem List   Diagnosis Date Noted  . Chronic pain of both knees   . Hyperthyroidism 04/20/2018  . MDD (major depressive disorder), recurrent severe, without psychosis (HCC)   . CKD (chronic kidney disease) stage 3, GFR 30-59 ml/min (HCC) 02/16/2016  . OSA (obstructive sleep  apnea) 02/13/2016  . GERD (gastroesophageal reflux disease) 07/10/2015  . Morbid (severe) obesity due to excess calories (HCC) complicated by OSA 07/07/2015  . Type 2 diabetes mellitus with stage 3 chronic kidney disease, without long-term current use of insulin (HCC) 04/11/2013  . Dyslipidemia 04/11/2013  . Essential hypertension 01/14/2013   Disposition: SNF  Discharge Condition: Stable  Discharge Exam:  Physical Exam:  Gen: NAD, alert, non-toxic, well-appearing, sitting comfortably in bed Skin: Warm and dry. No obvious rashes, lesions, or trauma. HEENT: NCAT  MMM.  CV: RRR. <2s capillary refill bilaterally.  RP & DPs 2+ bilaterally. No BLEE. Resp: CTAB.  No wheezing, rales, abnormal lung sounds.  No increased WOB Abd: NTND on palpation to all 4 quadrants.  Positive bowel sounds. Psych: Cooperative with exam. Pleasant. Makes eye contact. Speech normal. Extremities: Moves all extremities spontaneously. MSK: Tenderness along superior and medial aspect of knee. ROM limited.   Brief Hospital Course:  Yesenia [name] is a 57 y.o. female with past medical history significant for HTN, T2DM, HLD, morbid obesity, GERD, OSA, CKD, fibromyalgia, IBS, MDD, and newly diagnosed hyperthyroidism secondary to Grave's disease, who presented with worsening left knee pain and found to have worsening osteoarthritis of the knee. Initial work up was significant for negative x-ray of both the knee and hip for acute fracture. Left knee was noted to have diffuse osteoarthritic changes in all compartments without evidence of an effusion. No signs of septic arthritis. No erythema, effusion, or warmth. Labs were all within normal limits.  Patient's pain was managed well with a  Lidocaine patch. PT and OT were consulted which recommend SNF placement for optimization of stability due to lack of safety at home from stairs. Patient is recommended she follow-up with orthopedics outpatient for further evaluation and treatment of  worsening osteoarthritic disease of the knee.   During admission, patient was also found to have acute cystitis, culture positive for E. Coli. She was started on Keflex 500mg  BID x 5 days to continue as an outpatient. Family medicine will follow up sensitivities and adjust treatment accordingly.  Other hospital stay findings included anemia that appeared to be chronic in nature (10.3 on admission, 10-11 baseline). This was not worked up during admission but recommend further evaluation as an outpatient.   Additionally, patient's TSH was <0.010 with elevated free T4 (2.76) and T3 348. Patient was noted to have a new diagnosis of hyperthyroidism secondary to Grave's Disease and started on Methimazole just before admission. Recommended patient continue Methimazole and follow-up with endocrinology (referral per PCP) as an outpatient for definitive treatment.   Finally, prior to discharge patient endorsed some fecal incontinence and lower extremity numbness and weakness raising concern for cauda equina syndrome. MRI of lumbar spine significant for new mild degenerative disc disease at L2-L3 without impingement and unchanged mild degenerative disc disease at L4-L5, L5-S1 without impingement without signs of cauda equina.   Throughout admission, patient remained hemodynamically stable and afebrile. She was discharged to SNF for rehabilitation with recommendations for close follow-up with PCP, endocrinology, and orthopedics.  Issues for Follow Up:  1. Orthopedic follow-up for management of severe osteoarthritis of the knees 2. Follow-up with endocrinology for management of hyperthyroidism 3. Recommend evaluation of chronic stable anemia  4. Evaluate for resolution of acute cystitis  5. Although MRI was negative for cauda inguina, recommend further workup for fecal incontenence  Significant Procedures: None  Significant Labs and Imaging:  Recent Labs  Lab 05/07/18 0015  WBC 9.4  HGB 10.3*  HCT  33.4*  PLT 357   Recent Labs  Lab 05/07/18 0015  NA 141  K 4.1  CL 108  CO2 22  GLUCOSE 99  BUN 33*  CREATININE 0.92  CALCIUM 10.0  ALKPHOS 90  AST 16  ALT 19  ALBUMIN 2.9*    Mr Lumbar Spine W Wo Contrast  Result Date: 05/08/2018 CLINICAL DATA:  Fecal incontinence with bilateral leg weakness and numbness. Evaluate for cauda equina syndrome. EXAM: MRI LUMBAR SPINE WITHOUT AND WITH CONTRAST TECHNIQUE: Multiplanar and multiecho pulse sequences of the lumbar spine were obtained without and with intravenous contrast. CONTRAST:  10 mL Gadavist intravenous contrast. COMPARISON:  MRI lumbar spine dated April 13, 2016. FINDINGS: Segmentation:  Standard. Alignment:  Trace retrolisthesis at L5-S1. Vertebrae: No fracture, evidence of discitis, or bone lesion. Chronic degenerative fatty endplate marrow changes at L5-S1. Conus medullaris and cauda equina: Conus extends to the L2 level. Conus and cauda equina appear normal. No intradural enhancement. Paraspinal and other soft tissues: Bilateral renal cysts. Otherwise negative. Disc levels: T11-T12: Unchanged mild disc bulge and mild bilateral neuroforaminal stenosis. T12-L1:  Negative. L1-L2:  Negative. L2-L3: New shallow broad-based right paracentral and foraminal disc protrusion. No stenosis. L3-L4:  Negative. L4-L5: Unchanged mild diffuse disc bulge and moderate bilateral facet arthropathy. No stenosis. L5-S1: Unchanged mild diffuse disc bulge eccentric to the right and endplate spurring. Unchanged moderate bilateral facet arthropathy. Unchanged mild right greater than left neuroforaminal stenosis. No spinal canal stenosis. IMPRESSION: 1. New mild degenerative disc disease at L2-L3 without impingement. 2. Unchanged mild degenerative  disc disease and moderate facet arthropathy at L4-L5 and L5-S1 without impingement. Electronically Signed   By: Obie Dredge M.D.   On: 05/08/2018 18:02   Nm Thyroid Mult Uptake W/imaging  Result Date:  04/30/2018 CLINICAL DATA:  TSH equal 0.006. Symptoms of hyperthyroidism: Weight loss, diarrhea, difficulty sleeping, tremors, hair loss, mood in his, increased thirst, weakness and dry skin. EXAM: THYROID SCAN AND UPTAKE - 4 AND 24 HOURS TECHNIQUE: Following oral administration of I-123 capsule, anterior planar imaging was acquired at 24 hours. Thyroid uptake was calculated with a thyroid probe at 4-6 hours and 24 hours. RADIOPHARMACEUTICALS:  Four hundred fifty-one uCi I-123 sodium iodide p.o. COMPARISON:  None. FINDINGS: Relative uniform uptake within both lobes of the thyroid gland. The right lobe is larger than the left lobe. No dominant hot or cold nodules identified. 6 hour I-123 uptake = 33.3% (normal 5-20%) 24 hour I-123 uptake = 51.4% (normal 10-30%) IMPRESSION: 1. Elevated 4 hour and 24 hour radioactive iodine uptake within a centrally normal thyroid scan. Findings compatible with Graves disease, which may be amendable to therapy with radial labeled iodine. Please select correct template: Electronically Signed   By: Signa Kell M.D.   On: 04/30/2018 07:51   Dg Knee Complete 4 Views Left  Result Date: 05/07/2018 CLINICAL DATA:  Pain for several days EXAM: LEFT KNEE - COMPLETE 4+ VIEW COMPARISON:  April 11, 2018 FINDINGS: Frontal, lateral, and bilateral oblique views were obtained. There is no fracture or dislocation. There is no appreciable joint effusion. There is generalized joint space narrowing with spurring in all compartments. No erosive changes. IMPRESSION: Diffuse osteoarthritic change in all compartments. No fracture or joint effusion evident. Electronically Signed   By: Bretta Bang III M.D.   On: 05/07/2018 02:32   Dg Knee Complete 4 Views Left  Result Date: 04/11/2018 CLINICAL DATA:  Left knee pain and swelling.  No known injury. EXAM: LEFT KNEE - COMPLETE 4+ VIEW COMPARISON:  None. FINDINGS: No fracture or dislocation. Moderate tricompartmental osteoarthritis with peripheral  spurring. Mild medial tibiofemoral joint space narrowing. No bony destructive change. No significant joint effusion. No focal soft tissue abnormality. IMPRESSION: Moderate tricompartmental osteoarthritis without acute osseous abnormality. Electronically Signed   By: Narda Rutherford M.D.   On: 04/11/2018 23:04   Dg Hip Unilat W Or Wo Pelvis 2-3 Views Left  Result Date: 05/06/2018 CLINICAL DATA:  Left hip pain more than usual. EXAM: DG HIP (WITH OR WITHOUT PELVIS) 2-3V LEFT COMPARISON:  07/06/2015 FINDINGS: There is lumbar facet arthropathy of the included lower lumbar spine from L4 through S1. No pelvic diastasis or fracture. No significant joint space narrowing of the included hips. No acute fracture or bone destruction. IMPRESSION: 1. Degenerative facet arthropathy of the included lumbar spine. 2. No acute displaced fracture of the left hip nor suspicious/aggressive osseous lesions. 3. No pelvic diastasis or fracture. Electronically Signed   By: Tollie Eth M.D.   On: 05/06/2018 15:01   Results/Tests Pending at Time of Discharge: Urine Culture  Discharge Medications:  Allergies as of 05/09/2018      Reactions   Buprenorphine Hcl Hives   Morphine And Related Hives, Dermatitis      Medication List    STOP taking these medications   clonazePAM 0.5 MG tablet Commonly known as:  KLONOPIN   clonazePAM 1 MG tablet Commonly known as:  KLONOPIN   traMADol 50 MG tablet Commonly known as:  ULTRAM     TAKE these medications   amLODipine 5  MG tablet Commonly known as:  NORVASC Take 1 tablet (5 mg total) by mouth daily.   atenolol 100 MG tablet Commonly known as:  TENORMIN Take 1 tablet (100 mg total) by mouth daily.   cephALEXin 500 MG capsule Commonly known as:  KEFLEX Take 1 capsule (500 mg total) by mouth every 12 (twelve) hours.   diclofenac sodium 1 % Gel Commonly known as:  VOLTAREN Apply 2 g topically 3 (three) times daily as needed. What changed:    when to take  this  reasons to take this   Fish Oil 1000 MG Caps Take 1,000 mg by mouth daily.   gabapentin 300 MG capsule Commonly known as:  NEURONTIN Take 900 mg by mouth at bedtime.   hydrOXYzine 25 MG tablet Commonly known as:  ATARAX/VISTARIL Take 1 tablet (25 mg total) by mouth at bedtime.   hydrOXYzine 50 MG capsule Commonly known as:  VISTARIL Take 50 mg by mouth at bedtime.   hyoscyamine 0.125 MG SL tablet Commonly known as:  LEVSIN SL Take 0.125 mg by mouth 4 (four) times daily as needed for cramping.   lidocaine 5 % Commonly known as:  LIDODERM Place 1 patch onto the skin daily. Remove & Discard patch within 12 hours or as directed by MD   linaclotide 72 MCG capsule Commonly known as:  LINZESS Take 1 capsule (72 mcg total) by mouth daily before breakfast.   loperamide 2 MG tablet Commonly known as:  IMODIUM A-D Use as needed   methimazole 10 MG tablet Commonly known as:  TAPAZOLE Take 1 tablet (10 mg total) by mouth daily.   metoCLOPramide 5 MG tablet Commonly known as:  REGLAN Take 1 tablet (5 mg total) by mouth every 8 (eight) hours as needed for nausea.   mirtazapine 15 MG tablet Commonly known as:  REMERON Take 1 tablet (15 mg total) by mouth at bedtime.   ondansetron 8 MG disintegrating tablet Commonly known as:  ZOFRAN-ODT Take 1 tablet (8 mg total) by mouth every 8 (eight) hours as needed for nausea or vomiting.   pantoprazole 40 MG tablet Commonly known as:  PROTONIX Take 1 tablet (40 mg total) by mouth 2 (two) times daily.   traZODone 50 MG tablet Commonly known as:  DESYREL Take 1 tablet (50 mg total) by mouth at bedtime.   Vitamin D (Ergocalciferol) 50000 units Caps capsule Commonly known as:  DRISDOL Take 50,000 Units by mouth every Monday.   Vitamin D3 2000 units Tabs Take 4,000 Units by mouth daily.       Discharge Instructions: Please refer to Patient Instructions section of EMR for full details.  Patient was counseled important signs  and symptoms that should prompt return to medical care, changes in medications, dietary instructions, activity restrictions, and follow up appointments.   Follow-Up Appointments: Contact information for after-discharge care    Destination    [facility] .   Service:  Skilled Nursing Contact information: [address] [place] [address] [phone]              Westley Chandler, MD 05/09/2018, 2:03 PM PGY-1, [facility]

## 2018-05-09 DIAGNOSIS — N390 Urinary tract infection, site not specified: Secondary | ICD-10-CM

## 2018-05-09 DIAGNOSIS — R159 Full incontinence of feces: Secondary | ICD-10-CM | POA: Diagnosis not present

## 2018-05-09 DIAGNOSIS — M25561 Pain in right knee: Secondary | ICD-10-CM | POA: Diagnosis not present

## 2018-05-09 DIAGNOSIS — K219 Gastro-esophageal reflux disease without esophagitis: Secondary | ICD-10-CM

## 2018-05-09 LAB — URINE CULTURE

## 2018-05-09 MED ORDER — LIDOCAINE 5 % EX PTCH: 1.0000 | MEDICATED_PATCH | CUTANEOUS | 0 refills | Status: DC

## 2018-05-09 MED ORDER — CEPHALEXIN 500 MG PO CAPS: 500.0000 mg | ORAL_CAPSULE | Freq: Two times a day (BID) | ORAL | 0 refills | Status: DC

## 2018-05-09 NOTE — Progress Notes (Signed)
Pt discharged to SNF for rehab this pm after going over discharge teaching with no concerns voiced

## 2018-05-09 NOTE — Social Work (Signed)
Clinical Social Worker facilitated patient discharge including contacting patient family and facility to confirm patient discharge plans.  Clinical information faxed to facility and family agreeable with plan.  CSW arranged ambulance transport via [facility] to [facility]. RN to call [phone] with report prior to discharge.  Clinical Social Worker will sign off for now as social work intervention is no longer needed. Please consult [place] again if new need arises.  Doy Hutching, [place] Clinical Social Worker [phone]

## 2018-05-09 NOTE — Clinical Social Work Placement (Signed)
   CLINICAL SOCIAL WORK PLACEMENT  NOTE [facility]   Date:  05/09/2018  Patient Details  Name: Yesenia [name] MRN: [id number] Date of Birth: 1960-07-19  Clinical Social Work is seeking post-discharge placement for this patient at the Skilled  Nursing Facility level of care (*CSW will initial, date and re-position this form in  chart as items are completed):  Yes   Patient/family provided with [place] Clinical Social Work Department's list of facilities offering this level of care within the geographic area requested by the patient (or if unable, by the patient's family).  Yes   Patient/family informed of their freedom to choose among providers that offer the needed level of care, that participate in Medicare, Medicaid or managed care program needed by the patient, have an available bed and are willing to accept the patient.  Yes   Patient/family informed of [place]'s ownership interest in [facility] and [facility], as well as of the fact that they are under no obligation to receive care at these facilities.  PASRR submitted to EDS on       PASRR number received on 05/06/18     Existing PASRR number confirmed on       FL2 transmitted to all facilities in geographic area requested by pt/family on 05/06/18     FL2 transmitted to all facilities within larger geographic area on       Patient informed that his/her managed care company has contracts with or will negotiate with certain facilities, including the following:        Yes   Patient/family informed of bed offers received.  Patient chooses bed at Other - please specify in the comment section below:([facility])     Physician recommends and patient chooses bed at      Patient to be transferred to Other - please specify in the comment section below:([facility]) on 05/09/18.  Patient to be transferred to facility by [facility]     Patient family notified on 05/09/18 of  transfer.  Name of family member notified:  pt states she will call daughter     PHYSICIAN       Additional Comment:    _______________________________________________ Doy Hutching, LCSWA 05/09/2018, 11:11 AM

## 2018-05-10 ENCOUNTER — Ambulatory Visit (INDEPENDENT_AMBULATORY_CARE_PROVIDER_SITE_OTHER): Payer: Self-pay | Admitting: Orthopedic Surgery

## 2018-05-10 ENCOUNTER — Non-Acute Institutional Stay (SKILLED_NURSING_FACILITY): Payer: Medicaid Other | Admitting: Adult Health

## 2018-05-10 ENCOUNTER — Encounter: Payer: Self-pay | Admitting: Adult Health

## 2018-05-10 DIAGNOSIS — B9620 Unspecified Escherichia coli [E. coli] as the cause of diseases classified elsewhere: Secondary | ICD-10-CM

## 2018-05-10 DIAGNOSIS — K219 Gastro-esophageal reflux disease without esophagitis: Secondary | ICD-10-CM | POA: Diagnosis not present

## 2018-05-10 DIAGNOSIS — I10 Essential (primary) hypertension: Secondary | ICD-10-CM

## 2018-05-10 DIAGNOSIS — M797 Fibromyalgia: Secondary | ICD-10-CM

## 2018-05-10 DIAGNOSIS — N390 Urinary tract infection, site not specified: Secondary | ICD-10-CM

## 2018-05-10 DIAGNOSIS — E559 Vitamin D deficiency, unspecified: Secondary | ICD-10-CM

## 2018-05-10 DIAGNOSIS — G4733 Obstructive sleep apnea (adult) (pediatric): Secondary | ICD-10-CM | POA: Diagnosis not present

## 2018-05-10 DIAGNOSIS — E1122 Type 2 diabetes mellitus with diabetic chronic kidney disease: Secondary | ICD-10-CM

## 2018-05-10 DIAGNOSIS — M25562 Pain in left knee: Secondary | ICD-10-CM

## 2018-05-10 DIAGNOSIS — G8929 Other chronic pain: Secondary | ICD-10-CM

## 2018-05-10 DIAGNOSIS — E0590 Thyrotoxicosis, unspecified without thyrotoxic crisis or storm: Secondary | ICD-10-CM

## 2018-05-10 DIAGNOSIS — M25561 Pain in right knee: Secondary | ICD-10-CM

## 2018-05-10 DIAGNOSIS — N183 Chronic kidney disease, stage 3 (moderate): Secondary | ICD-10-CM

## 2018-05-10 DIAGNOSIS — F332 Major depressive disorder, recurrent severe without psychotic features: Secondary | ICD-10-CM

## 2018-05-10 NOTE — Progress Notes (Signed)
Location:   [facility] Room Number: 110 B Place of Service:  SNF (31)   CODE STATUS: Full Code  Allergies  Allergen Reactions  . Buprenorphine Hcl Hives  . Morphine And Related Hives and Dermatitis    Chief Complaint  Patient presents with  . Hospitalization Follow-up    Hospital follow up    HPI:  She is a 57 year old woman who has been hospitalized from 05-06-18 through 05-09-18 for bilateral lower extremity weakness and pain. She has recently had bilateral knee injections on 05-01-18. She is here for short term rehab with her goal to return back home. She recently found out that she has graves disease and will need to see endocrinology. She is having fecal incontinence and will need to be seen by her GI. She is complaining of worsening vision. She denies any uncontrolled pain; or changes in appetite. He goal is return back home.   Past Medical History:  Diagnosis Date  . Abdominal pain, epigastric   . Abscess   . Acute stomach ulcer 08/15/2017  . AKI (acute kidney injury) (HCC)    Acute kidney injury on CKD stage II-III/notes 08/17/2017  . Anemia   . Anemia   . Arthritis    "knees" (08/17/2017)  . Bleeding 06/23/2016  . Blood transfusion without reported diagnosis 2012   anemia  . Cerebral infarction (HCC) 01/14/2013  . Chest pain 02/15/2013  . Chronic pain   . CKD (chronic kidney disease)    Stage 3  . Depression   . Fibromyalgia    hospitilized 12/16 due to inability to walk  . Gastric ulcer   . History of gastritis 2019  . Hyperlipemia   . Hypertension   . Insomnia   . Iron deficiency anemia 05/26/2016  . Neuropathy   . Right sided weakness   . Sleep apnea    "CPAPs didn't work for me; that's why they did OR" (08/17/2017)  . Stroke [facility]) 2014?   no residual weakness   . 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 Tremor 10/25/2017  . Type II diabetes mellitus (HCC)     does not check CBG's (08/17/2017), no meds  . Vitamin deficiency    Vit D     Past Surgical History:  Procedure Laterality Date  . ESOPHAGOGASTRODUODENOSCOPY (EGD) WITH PROPOFOL N/A 08/16/2017   Procedure: ESOPHAGOGASTRODUODENOSCOPY (EGD) WITH PROPOFOL;  Surgeon: Benancio Deeds, MD;  Location: [facility] ENDOSCOPY;  Service: Gastroenterology;  Laterality: N/A;  . KNEE ARTHROPLASTY  1997   "stuck rods in it"  . KNEE ARTHROSCOPY Right 1992, 1995,  . OOPHORECTOMY Right 1993?  . TRACHEOSTOMY TUBE PLACEMENT N/A 02/13/2016   Procedure: TRACHEOSTOMY;  Surgeon: Christia Reading, MD;  Location: [facility] OR;  Service: ENT;  Laterality: N/A;  . TUBAL LIGATION      Social History   Socioeconomic History  . Marital status: Divorced    Spouse name: Not on file  . Number of children: 2  . Years of education: 12th   . Highest education level: Not on file  Occupational History  . Occupation: SSI  Social Needs  . Financial resource strain: Not on file  . Food insecurity:    Worry: Not on file    Inability: Not on file  . Transportation needs:    Medical: Not on file    Non-medical: Not on file  Tobacco Use  . Smoking status: Never Smoker  . Smokeless tobacco: Never Used  Substance and Sexual Activity  . Alcohol  use: No    Alcohol/week: 0.0 standard drinks  . Drug use: No  . Sexual activity: Never  Lifestyle  . Physical activity:    Days per week: Not on file    Minutes per session: Not on file  . Stress: Not on file  Relationships  . Social connections:    Talks on phone: Not on file    Gets together: Not on file    Attends religious service: Not on file    Active member of club or organization: Not on file    Attends meetings of clubs or organizations: Not on file    Relationship status: Not on file  . Intimate partner violence:    Fear of current or ex partner: Not on file    Emotionally abused: Not on file    Physically abused: Not on file    Forced sexual activity: Not on file  Other Topics Concern  . Not on file  Social History Narrative   Reports no  caffeine use    Family History  Problem Relation Age of Onset  . Breast cancer Mother   . Leukemia Brother   . Breast cancer Maternal Grandmother   . Colon cancer Neg Hx   . Esophageal cancer Neg Hx   . Rectal cancer Neg Hx   . Stomach cancer Neg Hx       VITAL SIGNS BP 130/70   Pulse 80   Temp 98.6 F (37 C)   Resp 18   Ht 5\' 6"  (1.676 m)   Wt 279 lb (126.6 kg)   BMI 45.03 kg/m   Outpatient Encounter Medications as of 05/10/2018  Medication Sig  . amLODipine (NORVASC) 5 MG tablet Take 1 tablet (5 mg total) by mouth daily.  Marland Kitchen atenolol (TENORMIN) 100 MG tablet Take 1 tablet (100 mg total) by mouth daily.  . cephALEXin (KEFLEX) 500 MG capsule Take 1 capsule (500 mg total) by mouth every 12 (twelve) hours.  . clonazePAM (KLONOPIN) 0.5 MG tablet Take 0.5 mg by mouth every 8 (eight) hours as needed for anxiety.  . diclofenac sodium (VOLTAREN) 1 % GEL Apply 2 g topically every 8 (eight) hours as needed. Apply to bilateral knees  . gabapentin (NEURONTIN) 300 MG capsule Take 900 mg by mouth at bedtime.  . hydrOXYzine (ATARAX/VISTARIL) 25 MG tablet Take 1 tablet (25 mg total) by mouth at bedtime.  . hydrOXYzine (VISTARIL) 50 MG capsule Take 50 mg by mouth at bedtime.   . hyoscyamine (LEVSIN SL) 0.125 MG SL tablet Take 0.125 mg by mouth 4 (four) times daily as needed for cramping.   . Lidocaine 4 % PTCH Apply to left knee topically daily  . linaclotide (LINZESS) 72 MCG capsule Take 1 capsule (72 mcg total) by mouth daily before breakfast.  . methimazole (TAPAZOLE) 10 MG tablet Take 1 tablet (10 mg total) by mouth daily.  . metoCLOPramide (REGLAN) 5 MG tablet Take 1 tablet (5 mg total) by mouth every 8 (eight) hours as needed for nausea.  . mirtazapine (REMERON) 15 MG tablet Take 1 tablet (15 mg total) by mouth at bedtime.  . Omega-3 Fatty Acids (FISH OIL) 1000 MG CAPS Take 1,000 mg by mouth daily.   . ondansetron (ZOFRAN-ODT) 8 MG disintegrating tablet Take 1 tablet (8 mg total) by  mouth every 8 (eight) hours as needed for nausea or vomiting.  . pantoprazole (PROTONIX) 40 MG tablet Take 1 tablet (40 mg total) by mouth 2 (two) times daily.  . traZODone (  DESYREL) 50 MG tablet Take 1 tablet (50 mg total) by mouth at bedtime.  . Vitamin D, Ergocalciferol, (DRISDOL) 50000 units CAPS capsule Take 50,000 Units by mouth every Monday.   . [DISCONTINUED] Cholecalciferol (VITAMIN D3) 2000 units TABS Take 4,000 Units by mouth daily.  . [DISCONTINUED] diclofenac sodium (VOLTAREN) 1 % GEL Apply 2 g topically 3 (three) times daily as needed. (Patient not taking: Reported on 05/10/2018)  . [DISCONTINUED] lidocaine (LIDODERM) 5 % Place 1 patch onto the skin daily. Remove & Discard patch within 12 hours or as directed by MD (Patient not taking: Reported on 05/10/2018)  . [DISCONTINUED] loperamide (IMODIUM A-D) 2 MG tablet Use as needed (Patient not taking: Reported on 05/07/2018)   No facility-administered encounter medications on file as of 05/10/2018.      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  While the ulnar nerve entrapment is more common at the cubital tunnel this does appear to be distal.  There is slowing both on distal sensory conduction as well as distal to the elbow on the motor conduction. There is no significant electrodiagnostic evidence of any other focal nerve entrapment brachial plexopathy or cervical radiculopathy.  There is no evidence suggestive of polyneuropathy.   LABS REVIEWED TODAY;   05-07-18; wbc 9.4; hgb 10.3; hct 33.4; mcv 79.7; plt 357;glucose 99; bun 33; creat 0.92; k+ 4.1; na++ 141; ca 10.0; liver normal albumin 2.9 tsh <0.010; free T3: 13.4; free T4: 2.76; hgb a1c 5.5 urine culture: e-coli   Review of Systems   Constitutional: Negative for malaise/fatigue.  Eyes:       Decreased vision  Respiratory: Negative for cough and shortness of breath.   Cardiovascular: Negative for chest pain, palpitations and leg swelling.  Gastrointestinal: Negative for abdominal pain, constipation and heartburn.       Fecal incontinence   Musculoskeletal: Positive for joint pain. Negative for back pain and myalgias.       Joint pain is managed  Has bilateral lower extremity weakness   Skin: Negative.   Neurological: Negative for dizziness.  Psychiatric/Behavioral: The patient is not nervous/anxious.     Physical Exam  Constitutional: She is oriented to person, place, and time. She appears well-developed and well-nourished. No distress.  Morbid obesity   Neck: No thyromegaly present.  Cardiovascular: Normal rate, regular rhythm, normal heart sounds and intact distal pulses.  Pulmonary/Chest: Effort normal and breath sounds normal. No respiratory distress.  Abdominal: Soft. Bowel sounds are normal. She exhibits no distension. There is no tenderness.  Musculoskeletal: She exhibits no edema.  Has limited range of motion in lower extremities   Lymphadenopathy:    She has no cervical adenopathy.  Neurological: She is alert and oriented to person, place, and time.  Skin: Skin is warm and dry. She is not diaphoretic.  Psychiatric: She has a normal mood and affect.      ASSESSMENT/ PLAN:  TODAY:   1. Essential hypertension: is stable b/p 130/70: will continue norvasc 5 mg daily and tenormin 100 mg daily   2. OSA  (obstructive sleep apnea): is stable will continue cpap  3.  GERD without esophagitis: is stable will continue protonix 40 mg twice daily and has levsin 0.125 mg four times daily as needed  4.  Type 2 diabetes mellitus with  stage 3 chronic kidney disease without long term current use of insulin: is stable hgb a1c 5.5; will monitor  5. CKD stage 3 due to type 2 diabetes mellitus: is stable bun 33  creat 0.92  6. Hyperthyroidism: is without change tsh  ,0.010 will continue tapazole 10 mg daily   7.  Chronic pain both knees: is stable will continue voltaren gel 2 gm to both knees every 8 hours as needed has lidoderm patch to left knee  8. Fibromyalgia: is stable will continue nuerontin 900 mg nightly   9.  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 Vitamin D deficiency: is stable will continue vit D 50,000 units weekly   11. E coli UTI: is stable will complete keflex 500 gm twice daily and will monitor  12. Bilateral lower extremity weakness: will continue therapy as directed to improve upon strength; gait balance.   Will need [facility] follow up for fecal incontinence Follow up with endocrinology: for graves disease Needs eye doctor for decreased vision with graves disease and diabetes           MD is aware of resident's narcotic use and is in agreement with current plan of care. We will attempt to wean resident as apropriate   Synthia Innocent NP [facility]  Contact [phone] Monday through Friday 8am- 5pm  After hours call [phone]

## 2018-05-13 ENCOUNTER — Other Ambulatory Visit: Payer: Self-pay | Admitting: Family Medicine

## 2018-05-13 DIAGNOSIS — B9620 Unspecified Escherichia coli [E. coli] as the cause of diseases classified elsewhere: Secondary | ICD-10-CM | POA: Insufficient documentation

## 2018-05-13 DIAGNOSIS — E559 Vitamin D deficiency, unspecified: Secondary | ICD-10-CM

## 2018-05-13 DIAGNOSIS — N390 Urinary tract infection, site not specified: Secondary | ICD-10-CM

## 2018-05-13 LAB — T3, FREE: T3, Free: 13.4 pg/mL — ABNORMAL HIGH (ref 2.0–4.4)

## 2018-05-15 ENCOUNTER — Telehealth: Payer: Self-pay

## 2018-05-15 NOTE — Telephone Encounter (Signed)
Pt called nurse line stating she is having a lot of joint and requested an apt with Kim ONLY. Selena Batten does not have an apt until 12/3, pt can not wait that long and refuses to see anyone else. Please call her when you get a chance. Number correct in chart.

## 2018-05-16 NOTE — Telephone Encounter (Signed)
Patient left second message. Stated her pain is not in her leg, she goes to rehab for that. Pain is her neck, body, and joints.  Call back is [phone]  Ples Specter, RN [facility] RN)

## 2018-05-17 ENCOUNTER — Encounter (INDEPENDENT_AMBULATORY_CARE_PROVIDER_SITE_OTHER): Payer: Self-pay | Admitting: Orthopedic Surgery

## 2018-05-17 ENCOUNTER — Ambulatory Visit (INDEPENDENT_AMBULATORY_CARE_PROVIDER_SITE_OTHER): Payer: Medicaid Other | Admitting: Orthopedic Surgery

## 2018-05-17 DIAGNOSIS — M79641 Pain in right hand: Secondary | ICD-10-CM | POA: Diagnosis not present

## 2018-05-17 NOTE — Progress Notes (Signed)
Office Visit Note   Patient: Yesenia [name]           Date of Birth: 1961-04-16           MRN: [id number] Visit Date: 05/17/2018 Requested by: Freddrick March, MD [address] [place], [place] [address] PCP: Melene Plan, MD  Subjective: Chief Complaint  Patient presents with  . Follow-up    HPI: Patient presents for follow-up of right hand EMG nerve study.  She is reporting primarily numbness and tingling and weakness in digits 3 4 and 5 in the right hand.  Denies any history of injury or trauma.  Nerve study does show compression at Guyon's canal.  Not much in the way of carpal tunnel compression or ulnar nerve compression at the elbow.              ROS: All systems reviewed are negative as they relate to the chief complaint within the history of present illness.  Patient denies  fevers or chills.   Assessment & Plan: Visit Diagnoses:  1. 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  This 1 is a little tricky to decompress.  May have a cyst in there.  Better to refer this to a hand surgeon for further evaluation and management.  I will send her to Dr. Mina Marble for evaluation.  I will put her in a wrist splint in the meantime.  Follow-up with me as needed  Follow-Up Instructions: Return if symptoms worsen or fail to improve.   Orders:  Orders Placed This Encounter  Procedures  . Ambulatory referral to Orthopedic Surgery   No orders of the defined types were placed in this encounter.     Procedures: No procedures performed   Clinical Data: No additional findings.  Objective: Vital Signs: There were no vitals taken for this visit.  Physical Exam:   Constitutional: Patient appears well-developed HEENT:  Head: Normocephalic Eyes:EOM are normal Neck: Normal range of motion Cardiovascular: Normal rate Pulmonary/chest: Effort normal Neurologic: Patient is alert Skin: Skin is warm Psychiatric: Patient has normal mood  and affect    Ortho Exam: Ortho exam demonstrates full active and passive range of motion of the wrist and elbow.  She does have a little interosseous weakness and does describe paresthesias on both the dorsal and palmar aspect of the hand.  Negative Tinel's right elbow in the cubital tunnel.  Wrist range of motion is full.  Hand is perfused from both the ulnar and radial artery.  I do not palpate any masses in the palmar or hyperthenar region.  Specialty Comments:  No specialty comments available.  Imaging: No results found.   PMFS History: Patient Active Problem List   Diagnosis Date Noted  . Vitamin D deficiency 05/13/2018  . E. coli UTI 05/13/2018  . Full incontinence of feces   . Pain 05/07/2018  . Chronic pain of both knees   . Hyperthyroidism 04/20/2018  . Dysphagia 08/16/2017  . Nausea and vomiting   . MDD (major depressive disorder), recurrent severe, without psychosis (HCC)   . AKI (acute kidney injury) (HCC) 08/15/2017  . Acute on chronic respiratory failure with hypoxemia (HCC) 09/30/2016  . Irritable bowel syndrome 05/21/2016  . Fibromyalgia 02/22/2016  . CKD stage 3 due to type 2 diabetes mellitus (HCC) 02/16/2016  . OSA (obstructive sleep apnea) 02/13/2016  . GERD (gastroesophageal reflux disease) 07/10/2015  . Acute kidney injury superimposed on chronic kidney disease (  HCC) 07/07/2015  . Morbid (severe) obesity due to excess calories (HCC) complicated by OSA 07/07/2015  . Type 2 diabetes mellitus with stage 3 chronic kidney disease, without long-term current use of insulin (HCC) 04/11/2013  . Dyslipidemia 04/11/2013  . Essential hypertension 01/14/2013   Past Medical History:  Diagnosis Date  . Abdominal pain, epigastric   . Abscess   . Acute stomach ulcer 08/15/2017  . AKI (acute kidney injury) (HCC)    Acute kidney injury on CKD stage II-III/notes 08/17/2017  . Anemia   . Anemia   . Arthritis    "knees" (08/17/2017)  . Bleeding 06/23/2016  . Blood  transfusion without reported diagnosis 2012   anemia  . Cerebral infarction (HCC) 01/14/2013  . Chest pain 02/15/2013  . Chronic pain   . CKD (chronic kidney disease)    Stage 3  . Depression   . Fibromyalgia    hospitilized 12/16 due to inability to walk  . Gastric ulcer   . History of gastritis 2019  . Hyperlipemia   . Hypertension   . Insomnia   . Iron deficiency anemia 05/26/2016  . Neuropathy   . Right sided weakness   . Sleep apnea    "CPAPs didn't work for me; that's why they did OR" (08/17/2017)  . Stroke [facility]) 2014?   no residual weakness   . 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 Tremor 10/25/2017  . Type II diabetes mellitus (HCC)     does not check CBG's (08/17/2017), no meds  . Vitamin deficiency    Vit D    Family History  Problem Relation Age of Onset  . Breast cancer Mother   . Leukemia Brother   . Breast cancer Maternal Grandmother   . Colon cancer Neg Hx   . Esophageal cancer Neg Hx   . Rectal cancer Neg Hx   . Stomach cancer Neg Hx     Past Surgical History:  Procedure Laterality Date  . ESOPHAGOGASTRODUODENOSCOPY (EGD) WITH PROPOFOL N/A 08/16/2017   Procedure: ESOPHAGOGASTRODUODENOSCOPY (EGD) WITH PROPOFOL;  Surgeon: Benancio Deeds, MD;  Location: [facility] ENDOSCOPY;  Service: Gastroenterology;  Laterality: N/A;  . KNEE ARTHROPLASTY  1997   "stuck rods in it"  . KNEE ARTHROSCOPY Right 1992, 1995,  . OOPHORECTOMY Right 1993?  . TRACHEOSTOMY TUBE PLACEMENT N/A 02/13/2016   Procedure: TRACHEOSTOMY;  Surgeon: Christia Reading, MD;  Location: [facility] OR;  Service: ENT;  Laterality: N/A;  . TUBAL LIGATION     Social History   Occupational History  . Occupation: SSI  Tobacco Use  . Smoking status: Never Smoker  . Smokeless tobacco: Never Used  Substance and Sexual Activity  . Alcohol use: No    Alcohol/week: 0.0 standard drinks  . Drug use: No  . Sexual activity: Never

## 2018-05-17 NOTE — Telephone Encounter (Signed)
Spoke to patient over the phone. She is currently at [facility] and is having some medical problems there that she reports are not being addressed adequately such as stiff neck and throat "going in and out". Please double book me on Thursday, Nov 14 at 2:30. Please call [place] [phone] to confirm her appointment so that she may leave the facility. Please also confirm appointment with Mrs. Willette at [phone]. Thank you very much!

## 2018-05-18 NOTE — Telephone Encounter (Signed)
I have scheduled pt ask requested, but I spoke with the unit manager and she is not sure this appt can happen for a billing reason.   [facility] bills as a PCP so pt cant be seen by 2 PCPs while she is a resident there. Unit manager is going to double check and call me back.   Dr. Selena Batten, you may want to call [facility] and see what is possible before I call pt.  Fleeger, Maryjo Rochester, CMA

## 2018-05-18 NOTE — Telephone Encounter (Signed)
Thank you for looking into this. I am currently on night float for the week so I apologize for the delayed response. I have contacted the patient via number listed and LVM to let her know about the billing issue. If she would still like to come in on Thursday, I have informed her that it will be her responsibility to work it out with her insurance company and [address]. I have asked her to follow up with [facility] about whether or not she would like to keep her appointment on Thursday. For now, we will keep her unscheduled.  I appreciate all of your help!

## 2018-05-18 NOTE — Telephone Encounter (Signed)
To white team and Jessica. I do not have clearance in Epic to doublebook.

## 2018-05-19 NOTE — Telephone Encounter (Signed)
Patient left message that she has spoken with Medicaid and they have given her permission to see Dr Selena Batten on next Thursday and she would like to keep that appt.  Call back is [phone].  Ples Specter, RN [facility] RN)

## 2018-05-22 NOTE — Telephone Encounter (Signed)
Pt called back to let Dr. Selena Batten know that per medicaid she will be able to see her on Thursday. Fleeger, Maryjo Rochester, CMA

## 2018-05-25 ENCOUNTER — Encounter: Payer: Self-pay | Admitting: Family Medicine

## 2018-05-25 ENCOUNTER — Other Ambulatory Visit: Payer: Self-pay

## 2018-05-25 ENCOUNTER — Ambulatory Visit (INDEPENDENT_AMBULATORY_CARE_PROVIDER_SITE_OTHER): Payer: Medicaid Other | Admitting: Family Medicine

## 2018-05-25 VITALS — BP 130/80 | HR 88 | Temp 98.2°F | Ht 67.0 in | Wt 277.0 lb

## 2018-05-25 DIAGNOSIS — M170 Bilateral primary osteoarthritis of knee: Secondary | ICD-10-CM | POA: Insufficient documentation

## 2018-05-25 DIAGNOSIS — R300 Dysuria: Secondary | ICD-10-CM

## 2018-05-25 DIAGNOSIS — M797 Fibromyalgia: Secondary | ICD-10-CM

## 2018-05-25 DIAGNOSIS — M25512 Pain in left shoulder: Secondary | ICD-10-CM | POA: Diagnosis not present

## 2018-05-25 DIAGNOSIS — E0590 Thyrotoxicosis, unspecified without thyrotoxic crisis or storm: Secondary | ICD-10-CM

## 2018-05-25 LAB — POCT URINALYSIS DIP (MANUAL ENTRY)
BILIRUBIN UA: NEGATIVE
BILIRUBIN UA: NEGATIVE mg/dL
Glucose, UA: NEGATIVE mg/dL
Nitrite, UA: POSITIVE — AB
Protein Ur, POC: NEGATIVE mg/dL
Spec Grav, UA: 1.015 (ref 1.010–1.025)
Urobilinogen, UA: 0.2 E.U./dL
pH, UA: 7 (ref 5.0–8.0)

## 2018-05-25 LAB — POCT UA - MICROSCOPIC ONLY

## 2018-05-25 MED ORDER — ACETAMINOPHEN 500 MG PO CAPS: 500.0000 mg | ORAL_CAPSULE | Freq: Four times a day (QID) | ORAL | 0 refills | Status: DC | PRN

## 2018-05-25 MED ORDER — SULFAMETHOXAZOLE-TRIMETHOPRIM 800-160 MG PO TABS: 1.0000 | ORAL_TABLET | Freq: Two times a day (BID) | ORAL | 0 refills | Status: DC

## 2018-05-25 MED ORDER — NITROFURANTOIN MONOHYD MACRO 100 MG PO CAPS: 100.0000 mg | ORAL_CAPSULE | Freq: Two times a day (BID) | ORAL | 0 refills | Status: AC

## 2018-05-25 MED ORDER — DULOXETINE HCL 30 MG PO CPEP: ORAL_CAPSULE | ORAL | 0 refills | Status: DC

## 2018-05-25 NOTE — Assessment & Plan Note (Signed)
Patient reports that she has not yet been reached by endocrinology for an appointment. Sent another referral today.

## 2018-05-25 NOTE — Assessment & Plan Note (Addendum)
Patient treated for ecoli UTI on 11/2 and sent Keflex BID x 3 days. Patient reports dysuria for the last several days. UA today is significant for

## 2018-05-25 NOTE — Progress Notes (Signed)
SUBJECTIVE:  PCP: Melene Plan, MD Patient ID: MRN [id number]  Date of birth: Mar 11, 1961  HPI Yesenia [name] is a 57 y.o. female who presents to clinic with chief complaint of diffuse joint pain. Other complaints today include left neck and shoulder pain, [place]  #Diffuse joint pain  Patient complains of diffuse joint pain everywhere from head to toe.  She describes it as a soreness that is dull with constant constant achiness.  She describes it as a 9 out of 10 in pain.  She reports that this has been going on for a long time however has been exacerbated since being in inpatient rehab.  Per chart review, it appears that she has been diagnosed with fibromyalgia in the past.  #Left shoulder pain Left shoulder pain has been present for about 1 to 2 weeks.  She reports that it started shortly after starting to use her wheelchair at the rehab facility as she currently cannot walk due to bilateral knee pain. She reports that she has not tried anything just yet as the rehab facility has not provided her with any Tylenol as she does not have a prescription.  She reports that she has general full range of movement but that it hurts with some movements.  She is describes it as a muscle ache, rather than in the bone.  #Dysuria Patient reports dysuria that has been occurring for the last couple of days.  She reports that it hurts so bad that she has to hold her bed railings while she pees.  She reports having told her nurses at the inpatient rehab center, however she has not yet had any medical care.  It appears that she was treated for a UTI on 05/13/2018 with Keflex twice daily x3 days.  Her culture was positive for E. coli that was resistant to Bactrim and ampicillin.  It was sensitive to Macrobid.  Review of Symptoms: See HPI  HISTORY Medications & Allergies: Reviewed with patient and updated in EMR as appropriate.   SHx  reports that she has never smoked. She has never used smokeless tobacco. She  reports that she does not drink alcohol or use drugs. Currently staying at [facility]  Lives on second floor unit and currently unable to walk up her stairs to get to her home due to pain.   OBJECTIVE:  BP 130/80 (BP Location: Left Wrist, Patient Position: Sitting, Cuff Size: Large)   Pulse 88   Temp 98.2 F (36.8 C) (Oral)   Ht 5\' 7"  (1.702 m)   Wt 277 lb (125.6 kg)   SpO2 98%   BMI 43.38 kg/m   Physical Exam:  Gen: NAD, alert, non-toxic, well-appearing, sitting comfortably in wheel chair.  She is not tremulous on exam today. Skin: Warm and dry. No obvious rashes, lesions, or trauma. HEENT: NCAT No conjunctival pallor or injection. No scleral icterus or injection.  MMM.  Psych: Cooperative with exam. Pleasant. Makes eye contact. Speech normal. Anxious about several other pain issues Extremities: Moves all extremities spontaneously.  Has pain with passive left arm flexion to 90 degrees and extension to 30 degrees from trunk.  She has full passive range of motion with abduction, abduction, internal rotation, external rotation.  She has tenderness to palpation of pectoralis minor and pectoralis major on the left side.  She is also tender to palpation of her left upper back muscles. Neuro: CN II-XII grossly intact. No FNDs.  Back: spine symmetric w/o abnormal curvature. No TTP C/T/L  spinous processes   Pertinent Labs & Imaging:  Reviewed in chart  Results for orders placed or performed in visit on 05/25/18 (from the past 24 hour(s))  POCT urinalysis dipstick     Status: Abnormal   Collection Time: 05/25/18  3:34 PM  Result Value Ref Range   Color, UA yellow yellow   Clarity, UA cloudy (A) clear   Glucose, UA negative negative mg/dL   Bilirubin, UA negative negative   Ketones, POC UA negative negative mg/dL   Spec Grav, UA [id number] - 1.025   Blood, UA trace-intact (A) negative   pH, UA 7.0 5.0 - 8.0   Protein Ur, POC negative negative mg/dL   Urobilinogen, UA 0.2  0.2 or 1.0 E.U./dL   Nitrite, UA Positive (A) Negative   Leukocytes, UA Moderate (2+) (A) Negative     ASSESSMENT & PLAN:  Dysuria Patient treated for ecoli UTI on 11/2 and sent Keflex BID x 3 days. Patient reports dysuria for the last several days. UA today is significant for   Osteoarthritis of both knees Patient is currently at [facility] due to intense leg pain causing her to be unable to walk to her second story apartment. Patient reports of continued pain. Pt would like to start duloxetine.   30mg  x 1 week followed by 60mg  daily.   Will follow up with patient in 4 weeks when she is able to come in after in patient rehab.   Fibromyalgia, primary Patient's diffuse pain and osteoarthritic pain. Diagnosed previously in 2017. Patient has had several images showing OA in both knees, however, there are no acute findings for other pains she is experiencing. Complete neurological work up has not been obtained, but I believe that duloxetine will be beneficial for patient for both diffuse fibromyalgia pain and BL OA knee pain.  Starting duloxetine 30mg  x 1 week followed by 60 daily.   Hyperthyroidism Patient reports that she has not yet been reached by endocrinology for an appointment. Sent another referral today.   Acute pain of left shoulder Patient complains of left shoulder pain and neck pain. Pain appears to be muscular in nature. She has just started using a wheel chair and is right handed. She reports that her left pec minor and lats are sore. Likely due to using new muscles.   Conservative treatment w/ tylenol and expectant management that muscle pain is normal after new motions and exercises.   Patient to follow up in 3-4 weeks or when she is able to obtain appointment after leaving inpatient rehab.   Genia Hotterachel Shakeia Krus, M.D. [facility]  PGY -1 05/25/2018, 2:59 PM

## 2018-05-25 NOTE — Patient Instructions (Addendum)
Dear Yesenia [name],   It was nice to see you today! I am glad you came in for your concerns. This document serves as a "wrap-up" to all that we discussed today and is listed as follows:   1. Hyperthyroidism - Ambulatory referral to Endocrinology  2. Body pain/ Knee osteoarthritis/ Fibromyaglia Please start 30mg  Duloxetine daily for one week. Then start taking 60mg  daily.  You may also take tylenol or "acetaminophen" 500mg  tablets. Take 1-2 tablets for pain as needed. Please do not take over 4,000mg  in one day.   3. Urinary pain  Your urinalysis was positive for UTI. I am going to start you on Macrobid. Please take this for 5 days. I am also sending out a culture to make sure that you are on the correct medication. I will let you know if this will cause any changes to your current medication.   Please follow up in 4 weeks for duloxetine follow up, or sooner for concerning or worsening symptoms.  Thank you for choosing [facility] for your primary care needs and stay well!   Best,   Dr. Genia Hotterachel Shawnay Bramel Resident Physician, PGY-1 [facility] [phone]    Don't forget to sign up for MyChart for instant access to your health profile, labs, orders, upcoming appointments or to contact your provider with questions. Stop at the front desk on the way out for more information about how to sign up!

## 2018-05-25 NOTE — Assessment & Plan Note (Signed)
Patient complains of left shoulder pain and neck pain. Pain appears to be muscular in nature. She has just started using a wheel chair and is right handed. She reports that her left pec minor and lats are sore. Likely due to using new muscles.   Conservative treatment w/ tylenol and expectant management that muscle pain is normal after new motions and exercises.

## 2018-05-25 NOTE — Assessment & Plan Note (Signed)
Patient's diffuse pain and osteoarthritic pain. Diagnosed previously in 2017. Patient has had several images showing OA in both knees, however, there are no acute findings for other pains she is experiencing. Complete neurological work up has not been obtained, but I believe that duloxetine will be beneficial for patient for both diffuse fibromyalgia pain and BL OA knee pain.  Starting duloxetine 30mg  x 1 week followed by 60 daily.

## 2018-05-25 NOTE — Assessment & Plan Note (Signed)
Patient is currently at [facility] due to intense leg pain causing her to be unable to walk to her second story apartment. Patient reports of continued pain. Pt would like to start duloxetine.   30mg  x 1 week followed by 60mg  daily.   Will follow up with patient in 4 weeks when she is able to come in after in patient rehab.

## 2018-05-26 ENCOUNTER — Encounter: Payer: Self-pay | Admitting: Internal Medicine

## 2018-05-29 LAB — URINE CULTURE

## 2018-06-07 ENCOUNTER — Encounter (INDEPENDENT_AMBULATORY_CARE_PROVIDER_SITE_OTHER): Payer: Self-pay | Admitting: Orthopedic Surgery

## 2018-06-07 ENCOUNTER — Ambulatory Visit (INDEPENDENT_AMBULATORY_CARE_PROVIDER_SITE_OTHER): Payer: Medicaid Other | Admitting: Orthopedic Surgery

## 2018-06-07 VITALS — Ht 67.0 in | Wt 277.0 lb

## 2018-06-07 DIAGNOSIS — M1711 Unilateral primary osteoarthritis, right knee: Secondary | ICD-10-CM | POA: Diagnosis not present

## 2018-06-07 DIAGNOSIS — M1712 Unilateral primary osteoarthritis, left knee: Secondary | ICD-10-CM | POA: Diagnosis not present

## 2018-06-07 DIAGNOSIS — M79641 Pain in right hand: Secondary | ICD-10-CM | POA: Diagnosis not present

## 2018-06-08 ENCOUNTER — Encounter (INDEPENDENT_AMBULATORY_CARE_PROVIDER_SITE_OTHER): Payer: Self-pay | Admitting: Orthopedic Surgery

## 2018-06-08 NOTE — Progress Notes (Signed)
Office Visit Note   Patient: Yesenia [name]           Date of Birth: [birth year]           MRN: [id number] Visit Date: 06/07/2018 Requested by: Melene Plan, MD [address] [place], [place] [address] PCP: Melene Plan, MD  Subjective: Chief Complaint  Patient presents with  . Left Knee - Pain, Follow-up  . Right Knee - Pain, Follow-up    HPI: Patient presents for evaluation for right knee pain.  She had bilateral knee injections 04/17/2018.  She did not get much relief from those injections.  She currently is in a wheelchair.  She was started on Cymbalta for multiple joint pains and fibromyalgia.  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  We would like to get her referred to a hand surgeon to have that addressed.  She has increased body mass index and is not a great candidate for knee replacement based on her weight and generalized deconditioned status.              ROS: All systems reviewed are negative as they relate to the chief complaint within the history of present illness.  Patient denies  fevers or chills.   Assessment & Plan: Visit Diagnoses:  1. Right hand pain   2. Unilateral primary osteoarthritis, left knee   3. Unilateral primary osteoarthritis, right knee     Plan: Impression is right hand Guyon's canal nerve compression which needs to be surgically evaluated by a hand surgeon.  She also has bilateral knee arthritis but is not a great candidate for total knee replacement.  We will try to get her set up to see Dr. Mack Hook for evaluation and management of this and problem.  I will see her back as needed.  Follow-Up Instructions: Return if symptoms worsen or fail to improve.   Orders:  Orders Placed This Encounter  Procedures  . Ambulatory referral to Orthopedic Surgery   No orders of the defined types were placed in this encounter.     Procedures: No procedures performed   Clinical Data: No additional  findings.  Objective: Vital Signs: Ht 5\' 7"  (1.702 m)   Wt 277 lb (125.6 kg)   BMI 43.38 kg/m   Physical Exam:   Constitutional: Patient appears well-developed HEENT:  Head: Normocephalic Eyes:EOM are normal Neck: Normal range of motion Cardiovascular: Normal rate Pulmonary/chest: Effort normal Neurologic: Patient is alert Skin: Skin is warm Psychiatric: Patient has normal mood and affect    Ortho Exam: Ortho exam demonstrates full active and passive range of motion of the ankles and hips.  Knees have pretty reasonable flexion past 90 degrees and no effusion.  Extensor mechanism is intact bilaterally but she does have some generalized muscle deconditioning affecting the legs.  She is wearing a splint on that right wrist.  Examination is unchanged.  Specialty Comments:  No specialty comments available.  Imaging: No results found.   PMFS History: Patient Active Problem List   Diagnosis Date Noted  . Dysuria 05/25/2018  . Osteoarthritis of both knees 05/25/2018  . Acute pain of left shoulder 05/25/2018  . Vitamin D deficiency 05/13/2018  . Pain 05/07/2018  . Chronic pain of both knees   . Hyperthyroidism 04/20/2018  . Dysphagia 08/16/2017  . Nausea and vomiting   . MDD (major depressive disorder), recurrent severe, without psychosis (HCC)   . Acute on chronic respiratory failure with hypoxemia (HCC)  09/30/2016  . Irritable bowel syndrome 05/21/2016  . Fibromyalgia, primary 02/22/2016  . CKD stage 3 due to type 2 diabetes mellitus (HCC) 02/16/2016  . OSA (obstructive sleep apnea) 02/13/2016  . GERD (gastroesophageal reflux disease) 07/10/2015  . Morbid (severe) obesity due to excess calories (HCC) complicated by OSA 07/07/2015  . Type 2 diabetes mellitus with stage 3 chronic kidney disease, without long-term current use of insulin (HCC) 04/11/2013  . Essential hypertension 01/14/2013   Past Medical History:  Diagnosis Date  . Abdominal pain, epigastric   .  Abscess   . Acute stomach ulcer 08/15/2017  . AKI (acute kidney injury) (HCC)    Acute kidney injury on CKD stage II-III/notes 08/17/2017  . Anemia   . Anemia   . Arthritis    "knees" (08/17/2017)  . Bleeding 06/23/2016  . Blood transfusion without reported diagnosis 2012   anemia  . Cerebral infarction (HCC) 01/14/2013  . Chest pain 02/15/2013  . Chronic pain   . CKD (chronic kidney disease)    Stage 3  . Depression   . Fibromyalgia    hospitilized 12/16 due to inability to walk  . Gastric ulcer   . History of gastritis 2019  . Hyperlipemia   . Hypertension   . Insomnia   . Iron deficiency anemia 05/26/2016  . Neuropathy   . Right sided weakness   . Sleep apnea    "CPAPs didn't work for me; that's why they did OR" (08/17/2017)  . Stroke [facility](HCC) 2014?   no residual weakness   . 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 Tremor 10/25/2017  . Type II diabetes mellitus (HCC)     does not check CBG's (08/17/2017), no meds  . Vitamin deficiency    Vit D    Family History  Problem Relation Age of Onset  . Breast cancer Mother   . Leukemia Brother   . Breast cancer Maternal Grandmother   . Colon cancer Neg Hx   . Esophageal cancer Neg Hx   . Rectal cancer Neg Hx   . Stomach cancer Neg Hx     Past Surgical History:  Procedure Laterality Date  . ESOPHAGOGASTRODUODENOSCOPY (EGD) WITH PROPOFOL N/A 08/16/2017   Procedure: ESOPHAGOGASTRODUODENOSCOPY (EGD) WITH PROPOFOL;  Surgeon: Benancio DeedsArmbruster, Steven P, MD;  [facility];  Service: Gastroenterology;  Laterality: N/A;  . KNEE ARTHROPLASTY  1997   "stuck rods in it"  . KNEE ARTHROSCOPY Right 1992, 1995,  . OOPHORECTOMY Right 1993?  . TRACHEOSTOMY TUBE PLACEMENT N/A 02/13/2016   Procedure: TRACHEOSTOMY;  Surgeon: Christia Readingwight Bates, MD;  [facility];  Service: ENT;  Laterality: N/A;  . TUBAL LIGATION     Social History   Occupational History  . Occupation: [employer]  Tobacco Use  . Smoking status: Never Smoker  . Smokeless tobacco: Never  Used  Substance and Sexual Activity  . Alcohol use: No    Alcohol/week: 0.0 standard drinks  . Drug use: No  . Sexual activity: Never

## 2018-06-13 ENCOUNTER — Telehealth (INDEPENDENT_AMBULATORY_CARE_PROVIDER_SITE_OTHER): Payer: Self-pay | Admitting: *Deleted

## 2018-06-13 ENCOUNTER — Telehealth: Payer: Self-pay | Admitting: Family Medicine

## 2018-06-13 NOTE — Telephone Encounter (Signed)
[name] [facility]rap[facility] is calling concerning this pt. He had orders to evaluate her for PT balance and endurance since being discharged from rehabilitation center. This is usually done within 48 hours of being discharged. The pt refused due to other appointments and would like to have her evaluation tomorrow 06/14/18.   Dannielle HuhDanny needs orders giving him permission to do this tomorrow because it will be longer than 48 hours after being discharged. The best call back number is [phone]. This line has a secured VM and is okay to leave a detailed message.

## 2018-06-13 NOTE — Telephone Encounter (Signed)
Called and left detailed voice mail giving permission for PT balance and endurance evaluation (>48hours discharge from rehab center). If patient needs order, he was given call back information.   Genia Hotterachel Naren Benally, M.D. 06/13/2018, 1:46 PM PGY-1, [facility]

## 2018-06-13 NOTE — Telephone Encounter (Signed)
Sent fax to [facility](meredith) @ [phone]

## 2018-06-14 ENCOUNTER — Telehealth: Payer: Self-pay | Admitting: *Deleted

## 2018-06-14 ENCOUNTER — Telehealth: Payer: Self-pay | Admitting: Family Medicine

## 2018-06-14 ENCOUNTER — Ambulatory Visit: Payer: Self-pay

## 2018-06-14 NOTE — Telephone Encounter (Signed)
Home care delivered called to check status of CMN form and physician orders for incontinent supplies.   Do not see in Dr. Reuel DerbyKims box.  Will forward to her. Kasen Adduci, Maryjo RochesterJessica Dawn, CMA

## 2018-06-14 NOTE — Telephone Encounter (Signed)
Called patient back and confirmed verbal order for PT 2x a week for 4 weeks and OT evaluation. Also spoke about other DME that patient might benefit from, but will [facility].   Genia Hotterachel Kim, M.D. 06/14/2018, 4:16 PM

## 2018-06-14 NOTE — Telephone Encounter (Signed)
[name] and is requesting verbal orders for PT for the patient 2 times a week for 4 weeks, He is also requesting a OT evaluation be done. Please call him with verbal orders if he doesn't answer he said we could leave a voicemail his phone is HIPPA compliant. [email or web address]

## 2018-06-15 ENCOUNTER — Encounter (INDEPENDENT_AMBULATORY_CARE_PROVIDER_SITE_OTHER): Payer: Self-pay

## 2018-06-15 ENCOUNTER — Ambulatory Visit: Payer: Medicaid Other | Admitting: Gastroenterology

## 2018-06-15 ENCOUNTER — Other Ambulatory Visit: Payer: Self-pay | Admitting: Orthopedic Surgery

## 2018-06-15 ENCOUNTER — Encounter (HOSPITAL_COMMUNITY): Payer: Self-pay

## 2018-06-15 ENCOUNTER — Encounter: Payer: Self-pay | Admitting: Gastroenterology

## 2018-06-15 VITALS — BP 120/62 | HR 70 | Ht 66.0 in | Wt 279.5 lb

## 2018-06-15 DIAGNOSIS — K529 Noninfective gastroenteritis and colitis, unspecified: Secondary | ICD-10-CM

## 2018-06-15 DIAGNOSIS — Z86010 Personal history of colonic polyps: Secondary | ICD-10-CM

## 2018-06-15 DIAGNOSIS — R110 Nausea: Secondary | ICD-10-CM

## 2018-06-15 DIAGNOSIS — R1013 Epigastric pain: Secondary | ICD-10-CM

## 2018-06-15 DIAGNOSIS — R159 Full incontinence of feces: Secondary | ICD-10-CM | POA: Diagnosis not present

## 2018-06-15 MED ORDER — ONDANSETRON 8 MG PO TBDP: 8.0000 mg | ORAL_TABLET | Freq: Three times a day (TID) | ORAL | 1 refills | Status: DC | PRN

## 2018-06-15 MED ORDER — ELUXADOLINE 75 MG PO TABS: 75.0000 mg | ORAL_TABLET | Freq: Two times a day (BID) | ORAL | 0 refills | Status: DC

## 2018-06-15 NOTE — H&P (Signed)
Yesenia [name] is an 57 y.o. female.   CC / Reason for Visit: Right upper extremity problem HPI: This patient is a 57 year old RHD female on disability presents for evaluation of her right upper extremity.  She reports that over the past year she has developed diminished sensibility on the ulnar aspect of the hand, as well as pain in the same region accompanied by weakness.  She is been evaluated by Dr. August Saucerean, and referred for NCS/EMG.  This was performed on 05-02-18 by Dr. 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  Conduction velocity across the elbow was noted to be 53, but across the wrist drop to 44.  EMGs were normal.  Sensory latency across the wrist was 3.8.  She notes that she has sleep apnea, and a couple years ago actually underwent tracheostomy because she could not tolerate CPAP mask.  This has been reversed as it did not seem to help.  She does not use CPAP.  Past Medical History:  Diagnosis Date  . Abdominal pain, epigastric   . Abscess   . Acute stomach ulcer 08/15/2017  . AKI (acute kidney injury) (HCC)    Acute kidney injury on CKD [name] II-III/notes 08/17/2017  . Anemia   . Anemia   . Arthritis    "knees" (08/17/2017)  . Bleeding 06/23/2016  . Blood transfusion without reported diagnosis 2012   anemia  . Cerebral infarction (HCC) 01/14/2013  . Chest pain 02/15/2013  . Chronic pain   . CKD (chronic kidney disease)    [name] 3  . Depression   . Fibromyalgia    hospitilized 12/16 due to inability to walk  . Gastric ulcer   . Graves disease   . History of gastritis 2019  . Hyperlipemia   . Hypertension   . Insomnia   . Iron deficiency anemia 05/26/2016  . Neuropathy   . Right sided weakness   . Sleep apnea    "CPAPs didn't work for me; that's why they did OR" (08/17/2017)  . Stroke [facility](HCC) 2014?   no residual weakness   . 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 Tremor 10/25/2017  . Type II diabetes mellitus (HCC)     does not  check CBG's (08/17/2017), no meds  . Vitamin deficiency    Vit D    Past Surgical History:  Procedure Laterality Date  . ESOPHAGOGASTRODUODENOSCOPY (EGD) WITH PROPOFOL N/A 08/16/2017   Procedure: ESOPHAGOGASTRODUODENOSCOPY (EGD) WITH PROPOFOL;  Surgeon: Benancio DeedsArmbruster, Steven P, MD;  [facility];  Service: Gastroenterology;  Laterality: N/A;  . KNEE ARTHROPLASTY  1997   "stuck rods in it"  . KNEE ARTHROSCOPY Right 1992, 1995,  . OOPHORECTOMY Right 1993?  . TRACHEOSTOMY TUBE PLACEMENT N/A 02/13/2016   Procedure: TRACHEOSTOMY;  Surgeon: Christia Readingwight Bates, MD;  [facility];  Service: ENT;  Laterality: N/A;  . TUBAL LIGATION      Family History  Problem Relation Age of Onset  . Breast cancer Mother   . Leukemia Brother   . Breast cancer Maternal Grandmother   . Colon cancer Neg Hx   . Esophageal cancer Neg Hx   . Rectal cancer Neg Hx   . Stomach cancer Neg Hx    Social History:  reports that she has never smoked. She has never used smokeless tobacco. She reports that she does not drink alcohol or use drugs.  Allergies:  Allergies  Allergen Reactions  . Buprenorphine Hcl Hives  . Morphine And Related Hives and  Dermatitis    No medications prior to admission.    No results found for this or any previous visit (from the past 48 hour(s)). No results found.  Review of Systems  All other systems reviewed and are negative.   There were no vitals taken for this visit. Physical Exam  Constitutional:  WD, WN, NAD HEENT:  NCAT, EOMI Neuro/Psych:  Alert & oriented to person, place, and time; appropriate mood & affect Lymphatic: No generalized UE edema or lymphadenopathy Extremities / MSK:  Both UE are normal with respect to appearance, ranges of motion, joint stability, muscle strength/tone, sensation, & perfusion except as otherwise noted:  The right hand has slightly diminished musculature.  There is a markedly positive [place]rem[place] sign and a positive Wartenberg sign.  However in  addition to interosseous weakness, FDP to the ring and small fingers are also weak.  There is a Tinel sign over the median nerve at the elbow as well as Guyon's canal and monofilament testing measures 2.83 in the median distribution, but she struggles to detect 6.65 on the pulp of the ring and small finger.  In addition, there is altered sensibility on the dorsal ulnar aspect of the hand, extending into the dorsum of the small finger and ulnar half of the ring finger.  Labs / Xrays:  No radiographic studies obtained today.  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  I indicated to her that surgical decompression does not guarantee recovery, but not proceeding with surgical decompression would very likely contribute to further decline.  The degree of decline at present is already impressive.  We will plan to proceed promptly, likely within the next one week with an open ulnar nerve decompression at the elbow, likely a decompression in situ unless the nerve proves to be unstable, at which time we would proceed with a subcutaneous anterior transposition.  However, in addition to addressing it at the elbow, decompression across the wrist and through Guyon's canal to include the deep motor branch would be wise as well.  The details of the operative procedure were discussed with the patient.  Questions were invited and answered.  In addition to the goal of the procedure, the risks of the procedure to include but not limited to bleeding; infection; damage to the nerves or blood vessels that could result in bleeding, numbness, weakness, chronic pain, and the need for additional procedures; stiffness; the need for revision surgery; and anesthetic risks were reviewed.  No specific outcome was guaranteed or implied.  Informed consent was obtained.  Jodi Marble, MD 06/15/2018, 8:09 PM

## 2018-06-15 NOTE — Patient Instructions (Addendum)
If you are age 56 or older, your body mass index should be between 23-30. Your Body mass index is 45.11 kg/m. If this is out of the aforementioned range listed, please consider follow up with your Primary Care Provider.  If you are age 56 or younger, your body mass index should be between 19-25. Your Body mass index is 45.11 kg/m. If this is out of the aformentioned range listed, please consider follow up with your Primary Care Provider.   Discontinue Remeron.  Take Zofran 8mg : Twice a day  We are giving you samples of Viberzi 75mg .  Take once a day for 2 days then take twice a day thereafter.  We are referring you to Physical Therapy for Pelvic Floor Therapy.  They will contact you to schedule an appointment.  Let us know if you have not heard from then within 2 weeks.   Thank you for entrusting me with your care and for choosing [facility], Dr. Ileene PatrickSteven Armbruster

## 2018-06-15 NOTE — Progress Notes (Signed)
HPI :  57 y/o female with multiple medical problems here for a follow up visit.  Since her last visit she had a colonoscopy with me in September. Unfortunately due to her chronic nausea she was not able to do the prep very well and had significant stool burden in the colon, the prep was inadequate for screening purposes. Random biopsies were taken for microscopic colitis given her chronic diarrhea and that was negative. Of note she had some significant hypotension associated with the procedure and required prolonged period of time to recover. She's continued to have loose stools that bother her. She has an average 4-5 bowel movements per day with some urgency. She's been having some episodes of incontinence and feels like she cannot hold stool in if there is urgency. She is additionally had some urinary incontinence which is also been bothering her. She denies any blood in her stools but has some perianal irritation from the stool frequency. She historically had been on Linzess for constipation but has stopped it.  She has a history of gastric ulcers in the past. Last noted on endoscopy in 2019. She's been treated with PPI and repeat EGD in July showed resolution of ulcers. She's had a prior CT scan of the abdomen as well as an ultrasound which have not shown any significant pathology other than hepatic steatosis. She's had a negative gastric emptying study. She continues to have chronic nausea with occasional episode of vomiting. She's been taking low-dose Reglan which does help at times. She finds Zofran helps significantly with the nausea, she takes roughly one tablet a day. She's been taking her Protonix and is compliant with that. I gave her a trial of Remeron for possible dyspepsia as buspirone has not helped in the past. She thinks Remeron did help with her appetite but has not helped somewhat with this dyspepsia or her nausea. Overall she doesn't feel like it is worth continuing given it hasn't  helped significantly.  Endoscopic history: Colonoscopy 03/31/2018 - inadequate prep, biopsies for microscopic colitis taken and negative. Hypotension associated with the procedure.  EGD 01/31/2018 - 2cm HH, mild antral erythema, otherwise normal -  EGD 08/16/2017 - 3-71mm clean based gastric ulcer, gastritis, a few nonbleeding duodenal ulcers, biopsies negative for H pylori Colonoscopy 2013 - Dr. Elnoria Howard, few small adenomas Past Medical History:  Diagnosis Date  . Abdominal pain, epigastric   . Abscess   . Acute stomach ulcer 08/15/2017  . AKI (acute kidney injury) (HCC)    Acute kidney injury on CKD stage II-III/notes 08/17/2017  . Anemia   . Anemia   . Arthritis    "knees" (08/17/2017)  . Bleeding 06/23/2016  . Blood transfusion without reported diagnosis 2012   anemia  . Cerebral infarction (HCC) 01/14/2013  . Chest pain 02/15/2013  . Chronic pain   . CKD (chronic kidney disease)    Stage 3  . Depression   . Fibromyalgia    hospitilized 12/16 due to inability to walk  . Gastric ulcer   . History of gastritis 2019  . Hyperlipemia   . Hypertension   . Insomnia   . Iron deficiency anemia 05/26/2016  . Neuropathy   . Right sided weakness   . Sleep apnea    "CPAPs didn't work for me; that's why they did OR" (08/17/2017)  . Stroke [facility]) 2014?   no residual weakness   . 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 Tremor 10/25/2017  . Type II diabetes mellitus (HCC)  does not check CBG's (08/17/2017), no meds  . Vitamin deficiency    Vit D     Past Surgical History:  Procedure Laterality Date  . ESOPHAGOGASTRODUODENOSCOPY (EGD) WITH PROPOFOL N/A 08/16/2017   Procedure: ESOPHAGOGASTRODUODENOSCOPY (EGD) WITH PROPOFOL;  Surgeon: Benancio DeedsArmbruster, Vitaly Wanat P, MD;  [facility];  Service: Gastroenterology;  Laterality: N/A;  . KNEE ARTHROPLASTY  1997   "stuck rods in it"  . KNEE ARTHROSCOPY Right 1992, 1995,  . OOPHORECTOMY Right 1993?  . TRACHEOSTOMY TUBE PLACEMENT N/A 02/13/2016    Procedure: TRACHEOSTOMY;  Surgeon: Christia Readingwight Bates, MD;  [facility];  Service: ENT;  Laterality: N/A;  . TUBAL LIGATION     Family History  Problem Relation Age of Onset  . Breast cancer Mother   . Leukemia Brother   . Breast cancer Maternal Grandmother   . Colon cancer Neg Hx   . Esophageal cancer Neg Hx   . Rectal cancer Neg Hx   . Stomach cancer Neg Hx    Social History   Tobacco Use  . Smoking status: Never Smoker  . Smokeless tobacco: Never Used  Substance Use Topics  . Alcohol use: No    Alcohol/week: 0.0 standard drinks  . Drug use: No   Current Outpatient Medications  Medication Sig Dispense Refill  . amLODipine (NORVASC) 5 MG tablet Take 1 tablet (5 mg total) by mouth daily. 30 tablet 3  . atenolol (TENORMIN) 100 MG tablet Take 1 tablet (100 mg total) by mouth daily. 90 tablet 3  . cephALEXin (KEFLEX) 500 MG capsule Take 1 capsule (500 mg total) by mouth every 12 (twelve) hours. 6 capsule 0  . diclofenac sodium (VOLTAREN) 1 % GEL Apply 2 g topically every 8 (eight) hours as needed. Apply to bilateral knees    . DULoxetine (CYMBALTA) 30 MG capsule Take 1 capsule (30 mg total) by mouth daily for 7 days, THEN 2 capsules (60 mg total) daily for 21 days. 49 capsule 0  . gabapentin (NEURONTIN) 300 MG capsule Take 900 mg by mouth at bedtime.  1  . hydrOXYzine (ATARAX/VISTARIL) 25 MG tablet Take 1 tablet (25 mg total) by mouth at bedtime. 30 tablet 0  . hydrOXYzine (VISTARIL) 50 MG capsule Take 50 mg by mouth at bedtime.     . hyoscyamine (LEVSIN SL) 0.125 MG SL tablet Take 0.125 mg by mouth 4 (four) times daily as needed for cramping.   2  . Lidocaine 4 % PTCH Apply to left knee topically daily    . methimazole (TAPAZOLE) 10 MG tablet Take 1 tablet (10 mg total) by mouth daily. 30 tablet 0  . metoCLOPramide (REGLAN) 5 MG tablet Take 1 tablet (5 mg total) by mouth every 8 (eight) hours as needed for nausea. 60 tablet 0  . mirtazapine (REMERON) 15 MG tablet Take 1 tablet (15 mg  total) by mouth at bedtime. 30 tablet 1  . Omega-3 Fatty Acids (FISH OIL) 1000 MG CAPS Take 1,000 mg by mouth daily.     . ondansetron (ZOFRAN-ODT) 8 MG disintegrating tablet Take 1 tablet (8 mg total) by mouth every 8 (eight) hours as needed for nausea or vomiting. 90 tablet 1  . pantoprazole (PROTONIX) 40 MG tablet Take 1 tablet (40 mg total) by mouth 2 (two) times daily. 60 tablet 0  . traZODone (DESYREL) 50 MG tablet Take 1 tablet (50 mg total) by mouth at bedtime. 60 tablet 0  . Vitamin D, Ergocalciferol, (DRISDOL) 50000 units CAPS capsule Take 50,000 Units by  mouth every Monday.   1   No current facility-administered medications for this visit.    Allergies  Allergen Reactions  . Buprenorphine Hcl Hives  . Morphine And Related Hives and Dermatitis     Review of Systems: All systems reviewed and negative except where noted in HPI.   Lab Results  Component Value Date   WBC 9.4 05/07/2018   HGB 10.3 (L) 05/07/2018   HCT 33.4 (L) 05/07/2018   MCV 79.7 (L) 05/07/2018   PLT 357 05/07/2018    Lab Results  Component Value Date   CREATININE 0.92 05/07/2018   BUN 33 (H) 05/07/2018   NA 141 05/07/2018   K 4.1 05/07/2018   CL 108 05/07/2018   CO2 22 05/07/2018    Lab Results  Component Value Date   ALT 19 05/07/2018   AST 16 05/07/2018   ALKPHOS 90 05/07/2018   BILITOT 0.3 05/07/2018     Physical Exam: BP 120/62   Pulse 70   Ht 5\' 6"  (1.676 m)   Wt 279 lb 8 oz (126.8 kg)   BMI 45.11 kg/m  Constitutional: Pleasant,well-developed, female in no acute distress. HEENT: Normocephalic and atraumatic. Conjunctivae are normal. No scleral icterus. Neck supple.  Cardiovascular: Normal rate, regular rhythm.  Pulmonary/chest: Effort normal and breath sounds normal. No wheezing, rales or rhonchi. Abdominal: Soft, protuberant, nontender. . There are no masses palpable. No hepatomegaly. DRE - standby [facility] - slightly low tone, low squeeze pressure, no mass  lesion Extremities: no edema Lymphadenopathy: No cervical adenopathy noted. Neurological: Alert and oriented to person place and time. Skin: Skin is warm and dry. No rashes noted. Psychiatric: Normal mood and affect. Behavior is normal.   ASSESSMENT AND PLAN: 57 year old female here for reassessment of the following issues:  Chronic diarrhea / fecal incontinence / urinary incontinence - I think her incontinence is due to a combination of loose stool in the setting of low tone of the pelvic floor muscles. Her DRE is remarkable for what appeared to be low anal tone and squeeze pressure. I'm recommending a referral to pelvic floor physical therapy to strengthen the pelvic floor and will hopefully help with her incontinence. I otherwise discussed options for her diarrhea. She has no history of pancreatitis and gallbladder is in place, we'll try low-dose Viberzi 75mg  q day to BID initially to see if that helps. If it does will give her a prescription. She agreed with the plan.   Dyspepsia / chronic nausea - I suspect functional symptoms. Remeron has not provided significant benefit, we'll stop it. Zofran seems to help her quite a bit, will ask her to take this twice daily as a standing dose to minimize her nausea, hopefully that helps. We'll await her course.  History of colon polyps - last colonoscopy for surveillance with associated with a poor preparation. She also had hypotension I suspect due to dehydration. She is agreeable to try another colonoscopy at some point time using a Gatorade MiraLAX prep, she may require 2 preps to get her ready for this. I think this attempt at colonoscopy should be done at the hospital given hypotension on her last exam. She was agreeable this, we'll plan on doing this tentatively in February.  Ileene Patrick, MD [facility]

## 2018-06-15 NOTE — Telephone Encounter (Signed)
Completed form on 12/4 and [facility]   Genia Hotterachel Patsye Sullivant, M.D. 06/15/2018, 12:29 PM PGY-1, [facility]

## 2018-06-16 ENCOUNTER — Encounter (HOSPITAL_COMMUNITY)
Admission: RE | Disposition: A | Discharge status: Home / Self Care | Payer: Self-pay | Source: Ambulatory Visit | Attending: Orthopedic Surgery

## 2018-06-16 ENCOUNTER — Ambulatory Visit (HOSPITAL_COMMUNITY): Payer: Medicaid Other | Admitting: Anesthesiology

## 2018-06-16 ENCOUNTER — Other Ambulatory Visit: Payer: Self-pay

## 2018-06-16 ENCOUNTER — Ambulatory Visit (HOSPITAL_COMMUNITY)
Admission: RE | Admit: 2018-06-16 | Discharge: 2018-06-16 | Disposition: A | Discharge status: Home / Self Care | Payer: Medicaid Other | Source: Ambulatory Visit | Attending: Orthopedic Surgery | Admitting: Orthopedic Surgery

## 2018-06-16 ENCOUNTER — Encounter (HOSPITAL_COMMUNITY): Payer: Self-pay | Admitting: *Deleted

## 2018-06-16 DIAGNOSIS — D631 Anemia in chronic kidney disease: Secondary | ICD-10-CM | POA: Insufficient documentation

## 2018-06-16 DIAGNOSIS — E1122 Type 2 diabetes mellitus with diabetic chronic kidney disease: Secondary | ICD-10-CM | POA: Diagnosis not present

## 2018-06-16 DIAGNOSIS — Z8719 Personal history of other diseases of the digestive system: Secondary | ICD-10-CM | POA: Diagnosis not present

## 2018-06-16 DIAGNOSIS — Z806 Family history of leukemia: Secondary | ICD-10-CM | POA: Diagnosis not present

## 2018-06-16 DIAGNOSIS — E0500 Thyrotoxicosis with diffuse goiter without thyrotoxic crisis or storm: Secondary | ICD-10-CM | POA: Diagnosis not present

## 2018-06-16 DIAGNOSIS — Z803 Family history of malignant neoplasm of breast: Secondary | ICD-10-CM | POA: Diagnosis not present

## 2018-06-16 DIAGNOSIS — E785 Hyperlipidemia, unspecified: Secondary | ICD-10-CM | POA: Insufficient documentation

## 2018-06-16 DIAGNOSIS — F329 Major depressive disorder, single episode, unspecified: Secondary | ICD-10-CM | POA: Insufficient documentation

## 2018-06-16 DIAGNOSIS — E1140 Type 2 diabetes mellitus with diabetic neuropathy, unspecified: Secondary | ICD-10-CM | POA: Insufficient documentation

## 2018-06-16 DIAGNOSIS — G4730 Sleep apnea, unspecified: Secondary | ICD-10-CM | POA: Diagnosis not present

## 2018-06-16 DIAGNOSIS — G5621 Lesion of ulnar nerve, right upper limb: Secondary | ICD-10-CM | POA: Diagnosis not present

## 2018-06-16 DIAGNOSIS — Z888 Allergy status to other drugs, medicaments and biological substances status: Secondary | ICD-10-CM | POA: Insufficient documentation

## 2018-06-16 DIAGNOSIS — Z885 Allergy status to narcotic agent status: Secondary | ICD-10-CM | POA: Insufficient documentation

## 2018-06-16 DIAGNOSIS — M1990 Unspecified osteoarthritis, unspecified site: Secondary | ICD-10-CM | POA: Insufficient documentation

## 2018-06-16 DIAGNOSIS — Z8673 Personal history of transient ischemic attack (TIA), and cerebral infarction without residual deficits: Secondary | ICD-10-CM | POA: Insufficient documentation

## 2018-06-16 DIAGNOSIS — I129 Hypertensive chronic kidney disease with stage 1 through stage 4 chronic kidney disease, or unspecified chronic kidney disease: Secondary | ICD-10-CM | POA: Diagnosis not present

## 2018-06-16 DIAGNOSIS — G6289 Other specified polyneuropathies: Secondary | ICD-10-CM | POA: Diagnosis present

## 2018-06-16 DIAGNOSIS — N183 Chronic kidney disease, stage 3 (moderate): Secondary | ICD-10-CM | POA: Insufficient documentation

## 2018-06-16 DIAGNOSIS — M797 Fibromyalgia: Secondary | ICD-10-CM | POA: Diagnosis not present

## 2018-06-16 HISTORY — PX: ANTERIOR INTEROSSEOUS NERVE DECOMPRESSION: SHX5735

## 2018-06-16 HISTORY — DX: Thyrotoxicosis with diffuse goiter without thyrotoxic crisis or storm: E05.00

## 2018-06-16 LAB — BASIC METABOLIC PANEL
Anion gap: 13 (ref 5–15)
BUN: 22 mg/dL — ABNORMAL HIGH (ref 6–20)
CO2: 14 mmol/L — ABNORMAL LOW (ref 22–32)
Calcium: 9.2 mg/dL (ref 8.9–10.3)
Chloride: 116 mmol/L — ABNORMAL HIGH (ref 98–111)
Creatinine, Ser: 1.29 mg/dL — ABNORMAL HIGH (ref 0.44–1.00)
GFR calc Af Amer: 54 mL/min — ABNORMAL LOW (ref >60)
GFR calc non Af Amer: 46 mL/min — ABNORMAL LOW (ref >60)
Glucose, Bld: 95 mg/dL (ref 70–99)
Potassium: 5.2 mmol/L — ABNORMAL HIGH (ref 3.5–5.1)
Sodium: 143 mmol/L (ref 135–145)

## 2018-06-16 LAB — GLUCOSE, CAPILLARY
Glucose-Capillary: 104 mg/dL — ABNORMAL HIGH (ref 70–99)
Glucose-Capillary: 92 mg/dL (ref 70–99)

## 2018-06-16 LAB — CBC
HCT: 32.8 % — ABNORMAL LOW (ref 36.0–46.0)
HEMOGLOBIN: 9.9 g/dL — AB (ref 12.0–15.0)
MCH: 24.6 pg — ABNORMAL LOW (ref 26.0–34.0)
MCHC: 30.2 g/dL (ref 30.0–36.0)
MCV: 81.4 fL (ref 80.0–100.0)
Platelets: UNDETERMINED 10*3/uL (ref 150–400)
RBC: 4.03 MIL/uL (ref 3.87–5.11)
RDW: 16.8 % — ABNORMAL HIGH (ref 11.5–15.5)
WBC: 6.9 10*3/uL (ref 4.0–10.5)
nRBC: 0 % (ref 0.0–0.2)

## 2018-06-16 SURGERY — ANTERIOR INTEROSSEOUS NERVE DECOMPRESSION
Anesthesia: General | Site: Arm Lower | Laterality: Right

## 2018-06-16 MED ORDER — HYDROCODONE-ACETAMINOPHEN 7.5-325 MG PO TABS
ORAL_TABLET | ORAL | Status: AC
  Filled 2018-06-16: qty 1

## 2018-06-16 MED ORDER — 0.9 % SODIUM CHLORIDE (POUR BTL) OPTIME
TOPICAL | Status: DC | PRN
  Administered 2018-06-16: 1000 mL

## 2018-06-16 MED ORDER — PROMETHAZINE HCL 25 MG/ML IJ SOLN: 6.2500 mg | INTRAMUSCULAR | Status: DC | PRN

## 2018-06-16 MED ORDER — FENTANYL CITRATE (PF) 250 MCG/5ML IJ SOLN
INTRAMUSCULAR | Status: AC
  Filled 2018-06-16: qty 5

## 2018-06-16 MED ORDER — PROPOFOL 10 MG/ML IV BOLUS
INTRAVENOUS | Status: AC
  Filled 2018-06-16: qty 20

## 2018-06-16 MED ORDER — ACETAMINOPHEN 325 MG PO TABS: 650.0000 mg | ORAL_TABLET | Freq: Four times a day (QID) | ORAL | Status: DC

## 2018-06-16 MED ORDER — HYDROMORPHONE HCL 1 MG/ML IJ SOLN
0.2500 mg | INTRAMUSCULAR | Status: DC | PRN
  Administered 2018-06-16: 0.5 mg via INTRAVENOUS

## 2018-06-16 MED ORDER — LIDOCAINE HCL (PF) 1 % IJ SOLN
INTRAMUSCULAR | Status: AC
  Filled 2018-06-16: qty 30

## 2018-06-16 MED ORDER — BUPIVACAINE-EPINEPHRINE (PF) 0.25% -1:200000 IJ SOLN
INTRAMUSCULAR | Status: AC
  Filled 2018-06-16: qty 30

## 2018-06-16 MED ORDER — BUPIVACAINE HCL (PF) 0.5 % IJ SOLN
INTRAMUSCULAR | Status: AC
  Filled 2018-06-16: qty 30

## 2018-06-16 MED ORDER — OXYCODONE HCL 5 MG PO TABS: 5.0000 mg | ORAL_TABLET | Freq: Four times a day (QID) | ORAL | 0 refills | Status: DC | PRN

## 2018-06-16 MED ORDER — MIDAZOLAM HCL 2 MG/2ML IJ SOLN
INTRAMUSCULAR | Status: DC | PRN
  Administered 2018-06-16: 1 mg via INTRAVENOUS

## 2018-06-16 MED ORDER — PROPOFOL 10 MG/ML IV BOLUS
INTRAVENOUS | Status: DC | PRN
  Administered 2018-06-16: 200 mg via INTRAVENOUS

## 2018-06-16 MED ORDER — DEXAMETHASONE SODIUM PHOSPHATE 10 MG/ML IJ SOLN
INTRAMUSCULAR | Status: DC | PRN
  Administered 2018-06-16: 10 mg via INTRAVENOUS

## 2018-06-16 MED ORDER — HYDROCODONE-ACETAMINOPHEN 7.5-325 MG PO TABS
1.0000 | ORAL_TABLET | Freq: Once | ORAL | Status: AC | PRN
  Administered 2018-06-16: 1 via ORAL

## 2018-06-16 MED ORDER — DEXAMETHASONE SODIUM PHOSPHATE 10 MG/ML IJ SOLN
INTRAMUSCULAR | Status: AC
  Filled 2018-06-16: qty 1

## 2018-06-16 MED ORDER — MIDAZOLAM HCL 2 MG/2ML IJ SOLN
INTRAMUSCULAR | Status: AC
  Filled 2018-06-16: qty 2

## 2018-06-16 MED ORDER — CEFAZOLIN SODIUM 1 G IJ SOLR
INTRAMUSCULAR | Status: AC
  Filled 2018-06-16: qty 20

## 2018-06-16 MED ORDER — FENTANYL CITRATE (PF) 250 MCG/5ML IJ SOLN
INTRAMUSCULAR | Status: DC | PRN
  Administered 2018-06-16 (×2): 75 ug via INTRAVENOUS

## 2018-06-16 MED ORDER — ONDANSETRON HCL 4 MG/2ML IJ SOLN
INTRAMUSCULAR | Status: DC | PRN
  Administered 2018-06-16: 4 mg via INTRAVENOUS

## 2018-06-16 MED ORDER — HYDROMORPHONE HCL 1 MG/ML IJ SOLN
INTRAMUSCULAR | Status: AC
  Filled 2018-06-16: qty 1

## 2018-06-16 MED ORDER — BUPIVACAINE-EPINEPHRINE (PF) 0.5% -1:200000 IJ SOLN
INTRAMUSCULAR | Status: AC
  Filled 2018-06-16: qty 60

## 2018-06-16 MED ORDER — LIDOCAINE 2% (20 MG/ML) 5 ML SYRINGE
INTRAMUSCULAR | Status: DC | PRN
Start: 2018-06-16 — End: 2018-06-16
  Administered 2018-06-16: 100 mg via INTRAVENOUS

## 2018-06-16 MED ORDER — MEPERIDINE HCL 50 MG/ML IJ SOLN: 6.2500 mg | INTRAMUSCULAR | Status: DC | PRN

## 2018-06-16 MED ORDER — CEFAZOLIN SODIUM-DEXTROSE 2-4 GM/100ML-% IV SOLN
2.0000 g | INTRAVENOUS | Status: AC
  Administered 2018-06-16: 2 g via INTRAVENOUS

## 2018-06-16 MED ORDER — LACTATED RINGERS IV SOLN
INTRAVENOUS | Status: DC
  Administered 2018-06-16: 13:00:00 via INTRAVENOUS

## 2018-06-16 MED ORDER — BUPIVACAINE-EPINEPHRINE (PF) 0.5% -1:200000 IJ SOLN
INTRAMUSCULAR | Status: DC | PRN
  Administered 2018-06-16: 20 mL

## 2018-06-16 MED ORDER — EPHEDRINE SULFATE-NACL 50-0.9 MG/10ML-% IV SOSY
PREFILLED_SYRINGE | INTRAVENOUS | Status: DC | PRN
  Administered 2018-06-16 (×2): 10 mg via INTRAVENOUS

## 2018-06-16 MED ORDER — ACETAMINOPHEN 10 MG/ML IV SOLN: 1000.0000 mg | Freq: Once | INTRAVENOUS | Status: DC | PRN

## 2018-06-16 MED ORDER — ONDANSETRON HCL 4 MG/2ML IJ SOLN
INTRAMUSCULAR | Status: AC
  Filled 2018-06-16: qty 2

## 2018-06-16 SURGICAL SUPPLY — 45 items
BENZOIN TINCTURE PRP APPL 2/3 (GAUZE/BANDAGES/DRESSINGS) ×2 IMPLANT
BNDG COHESIVE 4X5 TAN STRL (GAUZE/BANDAGES/DRESSINGS) ×2 IMPLANT
BNDG ESMARK 4X9 LF (GAUZE/BANDAGES/DRESSINGS) IMPLANT
BNDG GAUZE ELAST 4 BULKY (GAUZE/BANDAGES/DRESSINGS) ×4 IMPLANT
CHLORAPREP W/TINT 26ML (MISCELLANEOUS) ×2 IMPLANT
CORDS BIPOLAR (ELECTRODE) ×2 IMPLANT
COVER MAYO STAND STRL (DRAPES) ×2 IMPLANT
COVER WAND RF STERILE (DRAPES) ×2 IMPLANT
CUFF TOURNIQUET SINGLE 18IN (TOURNIQUET CUFF) IMPLANT
DRAIN PENROSE 1/2X12 LTX STRL (WOUND CARE) IMPLANT
DRAIN PENROSE 1/4X12 LTX STRL (WOUND CARE) IMPLANT
DRAPE SURG 17X23 STRL (DRAPES) ×2 IMPLANT
DRAPE U-SHAPE 47X51 STRL (DRAPES) IMPLANT
DRSG ADAPTIC 3X8 NADH LF (GAUZE/BANDAGES/DRESSINGS) ×2 IMPLANT
DRSG EMULSION OIL 3X3 NADH (GAUZE/BANDAGES/DRESSINGS) ×2 IMPLANT
GAUZE SPONGE 4X4 12PLY STRL (GAUZE/BANDAGES/DRESSINGS) ×2 IMPLANT
GLOVE BIO SURGEON STRL SZ7.5 (GLOVE) ×2 IMPLANT
GLOVE BIOGEL PI IND STRL 8 (GLOVE) ×1 IMPLANT
GLOVE BIOGEL PI INDICATOR 8 (GLOVE) ×1
GOWN STRL REUS W/ TWL LRG LVL3 (GOWN DISPOSABLE) ×1 IMPLANT
GOWN STRL REUS W/TWL LRG LVL3 (GOWN DISPOSABLE) ×1
GOWN STRL REUS W/TWL XL LVL3 (GOWN DISPOSABLE) ×2 IMPLANT
KIT BASIN OR (CUSTOM PROCEDURE TRAY) ×2 IMPLANT
LOOP VESSEL MAXI BLUE (MISCELLANEOUS) IMPLANT
NEEDLE HYPO 25X1 1.5 SAFETY (NEEDLE) IMPLANT
NS IRRIG 1000ML POUR BTL (IV SOLUTION) ×2 IMPLANT
PACK ORTHO EXTREMITY (CUSTOM PROCEDURE TRAY) ×2 IMPLANT
PAD CAST 4YDX4 CTTN HI CHSV (CAST SUPPLIES) ×1 IMPLANT
PADDING CAST ABS 4INX4YD NS (CAST SUPPLIES) ×1
PADDING CAST ABS COTTON 4X4 ST (CAST SUPPLIES) ×1 IMPLANT
PADDING CAST COTTON 4X4 STRL (CAST SUPPLIES) ×1
RUBBERBAND STERILE (MISCELLANEOUS) IMPLANT
SLING ARM FOAM STRAP MED (SOFTGOODS) IMPLANT
SLING ARM XL FOAM STRAP (SOFTGOODS) ×2 IMPLANT
STRIP CLOSURE SKIN 1/2X4 (GAUZE/BANDAGES/DRESSINGS) ×2 IMPLANT
SUT ETHILON 8 0 BV130 4 (SUTURE) IMPLANT
SUT VIC AB 0 SH 27 (SUTURE) IMPLANT
SUT VIC AB 2-0 SH 27 (SUTURE)
SUT VIC AB 2-0 SH 27XBRD (SUTURE) IMPLANT
SUT VIC AB 3-0 SH 27 (SUTURE) ×1
SUT VIC AB 3-0 SH 27X BRD (SUTURE) ×1 IMPLANT
SUT VICRYL RAPIDE 4/0 PS 2 (SUTURE) IMPLANT
SYR 10ML LL (SYRINGE) IMPLANT
TOWEL OR 17X24 6PK STRL BLUE (TOWEL DISPOSABLE) ×2 IMPLANT
UNDERPAD 30X30 (UNDERPADS AND DIAPERS) ×2 IMPLANT

## 2018-06-16 NOTE — Discharge Instructions (Signed)
Discharge Instructions   You have a light dressing on your arm.  You may begin gentle motion of your fingers and hand immediately, but you should not do any heavy lifting or gripping.  Elevate your hand to reduce pain & swelling of the digits.  Ice over the operative site may be helpful to reduce pain & swelling.  DO NOT USE HEAT. Pain medicine has been prescribed for you.  Take Tylenol OTC 650 mg every 6 hours. Take the Oxycodone additionally as a rescue medicine for sever post op pain. Leave the dressing in place until the third day after your surgery and then remove it, leaving it open to air.  After the bandage has been removed you may shower, regularly washing the incision and letting the water run over it, but not submerging it (no swimming, soaking it in dishwater, etc.) You may drive a car when you are off of prescription pain medications and can safely control your vehicle with both hands. We will address whether therapy will be required or not when you return to the office. You may have already made your follow-up appointment when we completed your preop visit.  If not, please call our office today or the next business day to make your return appointment for 10-15 days after surgery.   Please call [phone] during normal business hours or [phone] after hours for any problems. Including the following:  - excessive redness of the incisions - drainage for more than 4 days - fever of more than 101.5 F  *Please note that pain medications will not be refilled after hours or on weekends.

## 2018-06-16 NOTE — Anesthesia Postprocedure Evaluation (Signed)
Anesthesia Post Note  Patient: Yesenia [name]  Procedure(s) Performed: RIGHT ULNAR NEUROPLATY AT THE ELBOW AND WRIST (Right Arm Lower)     Patient location during evaluation: PACU Anesthesia Type: General Level of consciousness: awake and alert Pain management: pain level controlled Vital Signs Assessment: post-procedure vital signs reviewed and stable Respiratory status: spontaneous breathing, nonlabored ventilation, respiratory function stable and patient connected to nasal cannula oxygen Cardiovascular status: blood pressure returned to baseline and stable Postop Assessment: no apparent nausea or vomiting Anesthetic complications: no    Last Vitals:  Vitals:   06/16/18 1645 06/16/18 1700  BP: 107/62 117/65  Pulse: 68 72  Resp: 18 19  Temp:  36.6 C  SpO2: 97% 98%    Last Pain:  Vitals:   06/16/18 1700  TempSrc:   PainSc: 3                  Trevor IhaStephen A Hazelyn Kallen

## 2018-06-16 NOTE — Anesthesia Preprocedure Evaluation (Addendum)
Anesthesia Evaluation  Patient identified by MRN, date of birth, ID band Patient awake    Reviewed: Allergy & Precautions, H&P , NPO status , Patient's Chart, lab work & pertinent test results  Airway Mallampati: III  TM Distance: >3 FB Neck ROM: Full    Dental no notable dental hx. (+) Teeth Intact   Pulmonary sleep apnea and Continuous Positive Airway Pressure Ventilation ,    Pulmonary exam normal breath sounds clear to auscultation       Cardiovascular Exercise Tolerance: Good hypertension, Pt. on medications Normal cardiovascular exam Rhythm:Regular Rate:Normal     Neuro/Psych Depression CVA    GI/Hepatic Neg liver ROS, GERD  Medicated,  Endo/Other  diabetes, Type 2  Renal/GU Renal InsufficiencyRenal disease     Musculoskeletal  (+) Arthritis , Fibromyalgia -  Abdominal   Peds  Hematology  (+) Blood dyscrasia, anemia ,   Anesthesia Other Findings   Reproductive/Obstetrics                            Anesthesia Physical Anesthesia Plan  ASA: IV  Anesthesia Plan: General   Post-op Pain Management:    Induction: Intravenous  PONV Risk Score and Plan: Treatment may vary due to age or medical condition, Ondansetron and Dexamethasone  Airway Management Planned: LMA  Additional Equipment:   Intra-op Plan:   Post-operative Plan:   Informed Consent: I have reviewed the patients History and Physical, chart, labs and discussed the procedure including the risks, benefits and alternatives for the proposed anesthesia with the patient or authorized representative who has indicated his/her understanding and acceptance.   Dental advisory given  Plan Discussed with:   Anesthesia Plan Comments:         Anesthesia Quick Evaluation

## 2018-06-16 NOTE — Anesthesia Procedure Notes (Signed)
Procedure Name: LMA Insertion Date/Time: 06/16/2018 3:22 PM Performed by: Burt Ekurner, Rolfe Hartsell Ashley, CRNA Pre-anesthesia Checklist: Patient identified, Emergency Drugs available, Suction available and Patient being monitored Patient Re-evaluated:Patient Re-evaluated prior to induction Oxygen Delivery Method: Circle system utilized Preoxygenation: Pre-oxygenation with 100% oxygen Induction Type: IV induction Ventilation: Mask ventilation without difficulty LMA: LMA with gastric port inserted LMA Size: 4.0 Number of attempts: 1 Placement Confirmation: positive ETCO2 and breath sounds checked- equal and bilateral Tube secured with: Tape Dental Injury: Teeth and Oropharynx as per pre-operative assessment

## 2018-06-16 NOTE — Interval H&P Note (Signed)
History and Physical Interval Note:  06/16/2018 2:56 PM  Yesenia [name]  has presented today for surgery, with the diagnosis of RIGHT ULNAR NEUROPATHY  The various methods of treatment have been discussed with the patient and family. After consideration of risks, benefits and other options for treatment, the patient has consented to  Procedure(s): RIGHT ULNAR NEUROPLATY AT THE ELBOW AND WRIST (Right) as a surgical intervention .  The patient's history has been reviewed, patient examined, no change in status, stable for surgery.  I have reviewed the patient's chart and labs.  Questions were answered to the patient's satisfaction.     Jodi Marbleavid A Kasheena Sambrano

## 2018-06-16 NOTE — Transfer of Care (Signed)
Immediate Anesthesia Transfer of Care Note  Patient: Yesenia [name]  Procedure(s) Performed: RIGHT ULNAR NEUROPLATY AT THE ELBOW AND WRIST (Right Arm Lower)  Patient Location: PACU  Anesthesia Type:General  Level of Consciousness: awake, alert , oriented and patient cooperative  Airway & Oxygen Therapy: Patient Spontanous Breathing  Post-op Assessment: Report given to RN, Post -op Vital signs reviewed and stable and Patient moving all extremities X 4  Post vital signs: Reviewed and stable  Last Vitals:  Vitals Value Taken Time  BP 132/63 06/16/2018  4:17 PM  Temp    Pulse 75 06/16/2018  4:19 PM  Resp 40 06/16/2018  4:19 PM  SpO2 98 % 06/16/2018  4:19 PM  Vitals shown include unvalidated device data.  Last Pain:  Vitals:   06/16/18 1318  TempSrc:   PainSc: 0-No pain      Patients Stated Pain Goal: 3 (06/16/18 1318)  Complications: No apparent anesthesia complications

## 2018-06-16 NOTE — Op Note (Signed)
06/16/2018  2:57 PM  PATIENT:  Yesenia [name]  57 y.o. 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 Janee Morn, MD  PHYSICIAN ASSISTANT: Danielle Rankin, OPA-C  ANESTHESIA:  general  SPECIMENS:  None  DRAINS:   None  EBL:  less than 50 mL  PREOPERATIVE INDICATIONS:  Yesenia [name] is a  57 y.o. female with right ulnar neuropathy.  The risks benefits and alternatives were discussed with the patient preoperatively including but not limited to the risks of infection, bleeding, nerve injury, cardiopulmonary complications, the need for revision surgery, among others, and the patient verbalized understanding and consented to proceed.  OPERATIVE IMPLANTS: none  OPERATIVE PROCEDURE:  After receiving prophylactic antibiotics, the patient was escorted to the operative theatre and placed in a supine position.  A surgical "time-out" was performed during which the planned procedure, proposed operative site, and the correct patient identity were compared to the operative consent and agreement confirmed by the circulating nurse according to current facility policy.  Following application of a tourniquet to the operative extremity, the exposed skin was prepped with Chloraprep and draped in the usual sterile fashion.  The limb was exsanguinated with an Esmarch bandage and the tourniquet inflated to approximately higher than systolic BP.  A curvilinear incision was marked and made coursing along the ulnar nerve at the level of the elbow, centered between the medial epicondyle and the olecranon prominence.  There was extensive subcutaneous tissue, with which was dissected through combination of blunt and spreading dissection.  The deep fascia then over the ulnar nerve was identified and the Osborne's ligament released.  The forearm fascia was then  released proximal and distal under direct loupe assisted magnification dissection.  The FCU fascia of both superficial and deep was split, and the deep fascia over the nerve was divided for about 10 cm proximal to the medial epicondyle.  At the level of the medial epicondyle, there was also some muscular fibers crossing it, consistent with a small anconeus epitrochlearis.  These muscular fibers were excised.  The hand was little twitchy as the nerve was manipulated through the region where the compression seen maximal, which was at Osborne's ligament.  Once fully decompressed, the elbow was cycled from flexion to extension and the nerve remained stable in the groove.  A sponge was placed, and a towel clip used to close the wound provisionally while attention was shifted to the wrist.  At the level of the wrist, a 3 limb zigzag incision was fashioned over the course of the ulnar nerve, coursing obliquely in the middle limb between the pisiform and hook of the hamate.  The skin was incised sharply with scalpel and subcutaneous tissues dissected with blunt and spreading dissection.  Guyon's canal was opened, and the transverse carpal ligament was also split, thus affecting a carpal tunnel release as well.  The ulnar nerve was completely decompressed all the way from the junction of the distal and middle one third of the forearm to all of the terminal branches, including separate neuroplasty of the deep motor branch.  Site of compression was not visibly obvious.  The ulnar artery was protected in the course of this dissection.  The tourniquet was then released, the wounds irrigated and half percent Marcaine with epinephrine instilled into the wounds to help with postoperative pain control.  The skin distally  was closed with a combination of interrupted and running 4-0 Vicryl Rapide sutures, and proximally the same way.  A long-arm bulky dressing was applied followed by a sling and she was awakened and taken to the  recovery room in stable condition, breathing spontaneously.  DISPOSITION: She will be discharged home today with typical instructions, return in 10 to 15 days.

## 2018-06-17 ENCOUNTER — Encounter (HOSPITAL_COMMUNITY): Payer: Self-pay | Admitting: Orthopedic Surgery

## 2018-06-22 ENCOUNTER — Telehealth: Payer: Self-pay | Admitting: Family Medicine

## 2018-06-22 ENCOUNTER — Ambulatory Visit: Payer: Medicaid Other | Admitting: Physical Therapy

## 2018-06-22 NOTE — Telephone Encounter (Signed)
Dannielle HuhDanny is calling for new verbal orders for the patient. This week one time. Then 2 times a week three times,1 time a week one time. You can call Dannielle HuhDanny [phone]. jw

## 2018-06-22 NOTE — Telephone Encounter (Signed)
Called [name] at [phone] at 1643. Pt had a doctor's appointment today and cancelled PT today. Dannielle HuhDanny would like to move around PT schedule to best fit patient needs and goals.  This week 1x For the next three weeks, 2x a week  After that, 1x in the last week.   Verbally ordered.   Genia Hotterachel Beldon Nowling, M.D. 06/22/2018, 4:46 PM PGY-1, [facility]

## 2018-06-23 NOTE — Telephone Encounter (Signed)
[name] calling to change the verbal orders for this patient again. Pt keeps cancelling her appts. They are wanting orders to put the PT on hold until pt calls back stating she is ready for the PT.  Dannys callback is [phone] and you can leave a voicemail.

## 2018-06-26 NOTE — Telephone Encounter (Signed)
   Yesenia [name] called and states that the pt continues to cancel appointments and wants to put all physical therapy on hold until she is ready to resume.  States that she will call when ready.  Dr. Selena BattenKim can you please send a verbal call to cancel all orders.  Yesenia [name] can be reached at [phone] if there any questions.  [name].[name] R, CMA

## 2018-06-28 ENCOUNTER — Ambulatory Visit: Payer: Medicaid Other | Admitting: Physical Therapy

## 2018-06-29 NOTE — Telephone Encounter (Signed)
Called [name] on the phone. Ms. Yesenia [name] does not believe she is ready for therapy at this time. Will reassess during our next appointment and will write orders for PT if appropriate at that time.   Genia Hotterachel Hendry Speas, M.D. 06/29/2018, 10:53 AM PGY-1, [facility]

## 2018-07-18 ENCOUNTER — Telehealth: Payer: Self-pay

## 2018-07-18 NOTE — Telephone Encounter (Signed)
Called and spoke to pt.  She is available to have colonoscopy at [facility] on 08-15-2018.  Scheduled her previsit for Friday, 07-28-2018 at 1:00pm.  Pt is not on BT.  She is hypotensive.

## 2018-07-18 NOTE — Telephone Encounter (Signed)
-----   Message from Cooper Render, CMA sent at 06/15/2018  2:44 PM EST ----- Regarding: colon at [facility] in February Pt needs a colonoscopy at [facility] in February.  (chronic diarrhea, fecal incontinence, hx of colon polyps)

## 2018-07-19 ENCOUNTER — Ambulatory Visit: Payer: Medicaid Other | Admitting: Family Medicine

## 2018-07-19 ENCOUNTER — Ambulatory Visit: Payer: Self-pay

## 2018-07-19 ENCOUNTER — Encounter: Payer: Self-pay | Admitting: Family Medicine

## 2018-07-19 VITALS — BP 120/60 | HR 95 | Temp 98.7°F | Wt 281.6 lb

## 2018-07-19 DIAGNOSIS — E0590 Thyrotoxicosis, unspecified without thyrotoxic crisis or storm: Secondary | ICD-10-CM

## 2018-07-19 DIAGNOSIS — M170 Bilateral primary osteoarthritis of knee: Secondary | ICD-10-CM | POA: Diagnosis not present

## 2018-07-19 DIAGNOSIS — N179 Acute kidney failure, unspecified: Secondary | ICD-10-CM | POA: Diagnosis not present

## 2018-07-19 DIAGNOSIS — D649 Anemia, unspecified: Secondary | ICD-10-CM | POA: Diagnosis not present

## 2018-07-19 NOTE — Progress Notes (Signed)
SUBJECTIVE:  PCP: Wilber Oliphant, MD Patient ID: [id number]  Date of birth: Feb 09, 1961  HPO: Yesenia [name] is a 58 y.o. female who presents to clinic with chief complaint of SOB and shakiness concerning for hyperthyroidism.   #Shakiness & SOB Shaky, SOB, sick when she stands as well as the feeling of nausea or feeling that she is going to pass out. This all started two weeks ago. She reports that she started shaking one day while sitting down with her daughter and didn't notice until her daughter said something about the shaking.  She reports that she has continued to take the methimazole.  Though, she was only prescribed 1 month, she reports that she has continued to take the methimazole while in rehab center and then was given a prescription by her doctor from that center.  She also continues to take her atenolol for symptoms.  She reports that she also has shortness of breath going up the stairs which is also similar to her symptoms that she presented with in October.  She reported that this shortness of breath improved after initial thyroid treatment.  She was referred to an endocrinologist with whom she has an appointment with in June. (I personally called the endocrinologist office to see if she could get a sooner appointment, however, staff member informed me that this doctor only takes 1 Medicare patient a month and that Yesenia [name] will be the Medicare patient in June).    #Knee Pain Patient is also asking for a referral to [facility] as she is currently unhappy with her orthopedic doctor, Dr. Marlou Sa.  She complains that she still has pain in her knees.  Additionally she reports that she is unable to stand up due to some pain in her left upper knee.  She reports that there is some swelling above the kneecap and that it feels as if this area is getting pulled when she stands up making it difficult and painful.  Review of Symptoms: See HPI  HISTORY Medications & Allergies:  Reviewed with patient and updated in EMR as appropriate.   PMHx:  Patient Active Problem List   Diagnosis Date Noted  . Osteoarthritis of both knees 05/25/2018  . Acute pain of left shoulder 05/25/2018  . Vitamin D deficiency 05/13/2018  . Pain 05/07/2018  . Chronic pain of both knees   . Hyperthyroidism 04/20/2018  . Dysphagia 08/16/2017  . Nausea and vomiting   . MDD (major depressive disorder), recurrent severe, without psychosis ([place])   . Acute kidney injury ([place]) 08/15/2017  . Acute on chronic respiratory failure with hypoxemia ([place]) 09/30/2016  . Irritable bowel syndrome 05/21/2016  . Fibromyalgia, primary 02/22/2016  . CKD stage 3 due to type 2 diabetes mellitus ([place]) 02/16/2016  . OSA (obstructive sleep apnea) 02/13/2016  . GERD (gastroesophageal reflux disease) 07/10/2015  . Morbid (severe) obesity due to excess calories ([place]) complicated by OSA 85/27/7824  . Type 2 diabetes mellitus with stage 3 chronic kidney disease, without long-term current use of insulin ([place]) 04/11/2013  . Essential hypertension 01/14/2013  . Anemia 01/14/2013   Past Surgical History:  Procedure Laterality Date  . ANTERIOR INTEROSSEOUS NERVE DECOMPRESSION Right 06/16/2018   Procedure: RIGHT ULNAR NEUROPLATY AT THE ELBOW AND WRIST;  Surgeon: Milly Jakob, MD;  Location: [place];  Service: Orthopedics;  Laterality: Right;  . ESOPHAGOGASTRODUODENOSCOPY (EGD) WITH PROPOFOL N/A 08/16/2017   Procedure: ESOPHAGOGASTRODUODENOSCOPY (EGD) WITH PROPOFOL;  Surgeon: Yetta Flock, MD;  Location: [facility];  Service: Gastroenterology;  Laterality: N/A;  . [place]   "stuck rods in it"  . KNEE ARTHROSCOPY Right 1992, 1995,  . OOPHORECTOMY Right 1993?  . TRACHEOSTOMY TUBE PLACEMENT N/A 02/13/2016   Procedure: TRACHEOSTOMY;  Surgeon: Melida Quitter, MD;  Location: [facility];  Service: ENT;  Laterality: N/A;  . TUBAL LIGATION      FHx:  family history includes Breast cancer in her maternal  grandmother and mother; Leukemia in her brother. There is no history of Colon cancer, Esophageal cancer, Rectal cancer, or Stomach cancer.  SHx  reports that she has never smoked. She has never used smokeless tobacco. She reports that she does not drink alcohol or use drugs.  OBJECTIVE:  BP 120/60   Pulse 95   Temp 98.7 F (37.1 C) (Oral)   Wt 281 lb 9.6 oz (127.7 kg)   SpO2 99%   BMI 45.45 kg/m   Physical Exam:  Gen: Generally tremulous, with tremulous voice Skin: Warm and dry. Scars on lower neck and on right chest from prior surgeries. Scar on right wrist.  HEENT: NCAT No conjunctival pallor or injection. No scleral icterus or injection.  MMM. Mild exophtalmos. CV: RRR.  Normal S1-S2.   <2s capillary refill bilaterally.  RP 2+ bilaterally. No BLEE. Resp: CTAB.  No wheezing, rales, abnormal lung sounds.  No increased WOB Abd: NTND on palpation to all 4 quadrants.  Positive bowel sounds. Psych: Cooperative with exam. Pleasant. Makes eye contact. Speech normal. Extremities: Moves all extremities spontaneously. Tremulousness in extremities while sitting down. Neuro: CN II-XII grossly intact. No FNDs.   Pertinent Labs & Imaging:  Reviewed in chart, remarkable for Cr 1.2 on 12/6. Thyroid results reviewed previously.   ASSESSMENT & PLAN:  Anemia Patient with anemia over the last year.  Last normal hemoglobin was in June 2019.  There is no history of iron panel.  This is a possible cause of tachycardia and weakness.  Will assess for cause of anemia and treat for iron deficiency if labs come back positive.  Acute kidney injury [facility]) Patient noted to have creatinine of 1.2 which is above her normal on 12/6 labs.  These labs were likely taken prior right arm surgery.  It does not appear that there was any follow-up for this creatinine value.  Hyperthyroidism Patient's presentation with tremulousness and shortness of breath are very similar to presentation in October when I first met  patient for hyperthyroidism.  After further chart review, patient has not seen endocrine but has follow-up appointment with endocrine in June 2020.  After calling the endocrinologist office, I was told that since patient has Medicare, she is not going to be seen until June as provider only sees 1 Medicare patient each month.  Most likely causes for the symptoms are repeat and hyperthyroidism.  Patient is adamant that she has continued to take her 10 mg of methimazole and atenolol since prescribed in October of last year.  Also checking CBC for hemoglobin to rule out anemia as cause of tachycardia and weakness.  Patient is agreeable with waiting on increase in medications until labs come back.  She reports that she does not have much to do over the next few days and so she will be able to take care of herself at home appropriately. -Not making any changes in methimazole therapy until lab results come back. -If labs significant for hyperthyroidism, will increase methimazole from 10 mg to 15 mg daily.  Plans for medication management until patient  can be seen by endocrine in June where she may be a candidate for ablation therapy.  Osteoarthritis of both knees Patient comes in today asking for new referral for new orthopedist at [place].  She reports that she does not want to follow with her current orthopedist, Dr. Marlou Sa, any longer.  External referral ordered.  Due to patient's symptoms today, she is not interested in any health maintenance.  Health Maintenance Due  Topic Date Due  . Hepatitis C Screening  [birth year]  . FOOT EXAM  08/12/1971  . OPHTHALMOLOGY EXAM  08/12/1971  . URINE MICROALBUMIN  08/12/1971  . TETANUS/TDAP  08/11/1980  . PAP SMEAR-Modifier  08/11/1982  . INFLUENZA VACCINE  02/09/2018     [name], M.D. [place]  PGY -1 07/20/2018, 6:02 PM

## 2018-07-19 NOTE — Patient Instructions (Addendum)
Dear Yesenia [name],   It was nice to see you today! I am glad you came in for your concerns. This document serves as a "wrap-up" to all that we discussed today and is listed as follows:   1. Hyperthyroidism - TSH - T3, Free - T4, Free  2. Anemia, unspecified type - CBC - Iron, TIBC and Ferritin Panel  3. Acute kidney injury (HCC) - Basic Metabolic Panel    We are obtaining labs before we change any current therapy.   I will call you with your results and we will decide what medication changes to make and when you should follow up pending those results   Thank you for choosing [facility] for your primary care needs and stay well!   Best,   Dr. Genia Hotter Resident Physician, PGY-1 [facility] [phone]    Don't forget to sign up for MyChart for instant access to your health profile, labs, orders, upcoming appointments or to contact your provider with questions. Stop at the front desk on the way out for more information about how to sign up!

## 2018-07-20 LAB — BASIC METABOLIC PANEL WITH GFR
BUN/Creatinine Ratio: 14 (ref 9–23)
BUN: 11 mg/dL (ref 6–24)
CO2: 23 mmol/L (ref 20–29)
Calcium: 9.5 mg/dL (ref 8.7–10.2)
Chloride: 105 mmol/L (ref 96–106)
Creatinine, Ser: 0.79 mg/dL (ref 0.57–1.00)
GFR calc Af Amer: 97 mL/min/{1.73_m2}
GFR calc non Af Amer: 84 mL/min/{1.73_m2}
Glucose: 98 mg/dL (ref 65–99)
Potassium: 3.8 mmol/L (ref 3.5–5.2)
Sodium: 143 mmol/L (ref 134–144)

## 2018-07-20 LAB — CBC
Hematocrit: 40.3 % (ref 34.0–46.6)
Hemoglobin: 12.6 g/dL (ref 11.1–15.9)
MCH: 25.3 pg — ABNORMAL LOW (ref 26.6–33.0)
MCHC: 31.3 g/dL — ABNORMAL LOW (ref 31.5–35.7)
MCV: 81 fL (ref 79–97)
Platelets: 265 10*3/uL (ref 150–450)
RBC: 4.99 x10E6/uL (ref 3.77–5.28)
RDW: 15.7 % — ABNORMAL HIGH (ref 11.7–15.4)
WBC: 10.3 10*3/uL (ref 3.4–10.8)

## 2018-07-20 LAB — IRON,TIBC AND FERRITIN PANEL
Ferritin: 236 ng/mL — ABNORMAL HIGH (ref 15–150)
Iron Saturation: 26 % (ref 15–55)
Iron: 72 ug/dL (ref 27–159)
Total Iron Binding Capacity: 282 ug/dL (ref 250–450)
UIBC: 210 ug/dL (ref 131–425)

## 2018-07-20 LAB — TSH: TSH: <0.006 u[IU]/mL — ABNORMAL LOW (ref 0.450–4.500)

## 2018-07-20 LAB — T3, FREE: T3 FREE: 5.3 pg/mL — AB (ref 2.0–4.4)

## 2018-07-20 LAB — T4, FREE: Free T4: 1.45 ng/dL (ref 0.82–1.77)

## 2018-07-20 NOTE — Assessment & Plan Note (Signed)
Patient noted to have creatinine of 1.2 which is above her normal on 12/6 labs.  These labs were likely taken prior right arm surgery.  It does not appear that there was any follow-up for this creatinine value.

## 2018-07-20 NOTE — Assessment & Plan Note (Signed)
Patient with anemia over the last year.  Last normal hemoglobin was in June 2019.  There is no history of iron panel.  This is a possible cause of tachycardia and weakness.  Will assess for cause of anemia and treat for iron deficiency if labs come back positive.

## 2018-07-20 NOTE — Assessment & Plan Note (Signed)
Patient's presentation with tremulousness and shortness of breath are very similar to presentation in October when I first met patient for hyperthyroidism.  After further chart review, patient has not seen endocrine but has follow-up appointment with endocrine in June 2020.  After calling the endocrinologist office, I was told that since patient has Medicare, she is not going to be seen until June as provider only sees 1 Medicare patient each month.  Most likely causes for the symptoms are repeat and hyperthyroidism.  Patient is adamant that she has continued to take her 10 mg of methimazole and atenolol since prescribed in October of last year.  Also checking CBC for hemoglobin to rule out anemia as cause of tachycardia and weakness.  Patient is agreeable with waiting on increase in medications until labs come back.  She reports that she does not have much to do over the next few days and so she will be able to take care of herself at home appropriately. -Not making any changes in methimazole therapy until lab results come back. -If labs significant for hyperthyroidism, will increase methimazole from 10 mg to 15 mg daily.  Plans for medication management until patient can be seen by endocrine in June where she may be a candidate for ablation therapy.

## 2018-07-20 NOTE — Assessment & Plan Note (Signed)
Patient comes in today asking for new referral for new [facility].  She reports that she does not want to follow with her current orthopedist, Dr. August Saucerean, any longer.  External referral ordered.

## 2018-07-21 ENCOUNTER — Telehealth (INDEPENDENT_AMBULATORY_CARE_PROVIDER_SITE_OTHER): Payer: Self-pay | Admitting: Orthopedic Surgery

## 2018-07-21 NOTE — Telephone Encounter (Signed)
Emailed patient records release form per her request to [email or web address]

## 2018-07-22 ENCOUNTER — Encounter: Payer: Self-pay | Admitting: Family Medicine

## 2018-07-22 ENCOUNTER — Other Ambulatory Visit: Payer: Self-pay | Admitting: Family Medicine

## 2018-07-22 DIAGNOSIS — E0590 Thyrotoxicosis, unspecified without thyrotoxic crisis or storm: Secondary | ICD-10-CM

## 2018-07-22 MED ORDER — METHIMAZOLE 10 MG PO TABS: 20.0000 mg | ORAL_TABLET | Freq: Every day | ORAL | 2 refills | Status: DC

## 2018-07-22 MED ORDER — TRAZODONE HCL 50 MG PO TABS: 50.0000 mg | ORAL_TABLET | Freq: Every day | ORAL | 0 refills | Status: DC

## 2018-07-22 NOTE — Progress Notes (Signed)
Hgb normal, no anemia. BMP normal, normal kidney function.  Treatment plan: Primary treatment with methimazole (12-18 months) Patient ultimately wants ablation therapy. She f/u with endocrinology in June 2020.   TSH remains low as expected. T3 improving and T4 normal.  Plan:    Will increase methimazole to 20 mg daily.   For current symptoms, can titrate atenolol up to 100mg  BID. Continue 100mg  in AM with 50mg  PM if continued  At last visit, BP 120/60 and HR 95.   Patient will also need referral to ophthalmology.

## 2018-07-27 ENCOUNTER — Telehealth: Payer: Self-pay | Admitting: Gastroenterology

## 2018-07-27 NOTE — Telephone Encounter (Signed)
Previsit and colon cancelled due to pt not having transportation. The next hospital schedule for Dr. Adela Lank is not out. Note sent to Lucius Conn CMA to hold until next date comes out for her to be rescheduled.

## 2018-07-27 NOTE — Telephone Encounter (Signed)
[name] this patient had to cancel first week of February, can you please place the patient who needs a hospital case in her spot and put this patient on our waiting list? Thanks much

## 2018-07-27 NOTE — Telephone Encounter (Signed)
Pt needs to r/s proc at [facility]. Pls call her.

## 2018-08-08 ENCOUNTER — Emergency Department (HOSPITAL_COMMUNITY): Payer: Medicaid Other

## 2018-08-08 ENCOUNTER — Emergency Department (HOSPITAL_COMMUNITY)
Admission: EM | Admit: 2018-08-08 | Discharge: 2018-08-08 | Disposition: A | Discharge status: Home / Self Care | Payer: Medicaid Other | Attending: Emergency Medicine | Admitting: Emergency Medicine

## 2018-08-08 DIAGNOSIS — Z79899 Other long term (current) drug therapy: Secondary | ICD-10-CM | POA: Insufficient documentation

## 2018-08-08 DIAGNOSIS — E1122 Type 2 diabetes mellitus with diabetic chronic kidney disease: Secondary | ICD-10-CM | POA: Diagnosis not present

## 2018-08-08 DIAGNOSIS — I129 Hypertensive chronic kidney disease with stage 1 through stage 4 chronic kidney disease, or unspecified chronic kidney disease: Secondary | ICD-10-CM | POA: Insufficient documentation

## 2018-08-08 DIAGNOSIS — E0590 Thyrotoxicosis, unspecified without thyrotoxic crisis or storm: Secondary | ICD-10-CM

## 2018-08-08 DIAGNOSIS — R4182 Altered mental status, unspecified: Secondary | ICD-10-CM | POA: Diagnosis not present

## 2018-08-08 DIAGNOSIS — R479 Unspecified speech disturbances: Secondary | ICD-10-CM | POA: Diagnosis not present

## 2018-08-08 DIAGNOSIS — Z8673 Personal history of transient ischemic attack (TIA), and cerebral infarction without residual deficits: Secondary | ICD-10-CM | POA: Insufficient documentation

## 2018-08-08 DIAGNOSIS — N183 Chronic kidney disease, stage 3 (moderate): Secondary | ICD-10-CM | POA: Insufficient documentation

## 2018-08-08 DIAGNOSIS — R0602 Shortness of breath: Secondary | ICD-10-CM | POA: Diagnosis present

## 2018-08-08 DIAGNOSIS — N3000 Acute cystitis without hematuria: Secondary | ICD-10-CM

## 2018-08-08 LAB — URINALYSIS, ROUTINE W REFLEX MICROSCOPIC
Bilirubin Urine: NEGATIVE
Glucose, UA: NEGATIVE mg/dL
KETONES UR: NEGATIVE mg/dL
Nitrite: POSITIVE — AB
PH: 5 (ref 5.0–8.0)
Protein, ur: NEGATIVE mg/dL
Specific Gravity, Urine: 1.031 — ABNORMAL HIGH (ref 1.005–1.030)
WBC, UA: >50 WBC/hpf — ABNORMAL HIGH (ref 0–5)

## 2018-08-08 LAB — BASIC METABOLIC PANEL
Anion gap: 7 (ref 5–15)
BUN: 14 mg/dL (ref 6–20)
CO2: 24 mmol/L (ref 22–32)
Calcium: 8.5 mg/dL — ABNORMAL LOW (ref 8.9–10.3)
Chloride: 112 mmol/L — ABNORMAL HIGH (ref 98–111)
Creatinine, Ser: 0.79 mg/dL (ref 0.44–1.00)
GFR calc Af Amer: >60 mL/min (ref >60)
GFR calc non Af Amer: >60 mL/min (ref >60)
Glucose, Bld: 176 mg/dL — ABNORMAL HIGH (ref 70–99)
Potassium: 3.9 mmol/L (ref 3.5–5.1)
Sodium: 143 mmol/L (ref 135–145)

## 2018-08-08 LAB — BRAIN NATRIURETIC PEPTIDE: B Natriuretic Peptide: 39.6 pg/mL (ref 0.0–100.0)

## 2018-08-08 LAB — TROPONIN I: Troponin I: <0.03 ng/mL (ref <0.03)

## 2018-08-08 LAB — TSH: TSH: <0.01 u[IU]/mL — ABNORMAL LOW (ref 0.350–4.500)

## 2018-08-08 MED ORDER — LORAZEPAM 2 MG/ML IJ SOLN
1.0000 mg | Freq: Once | INTRAMUSCULAR | Status: AC
  Administered 2018-08-08: 1 mg via INTRAVENOUS
  Filled 2018-08-08: qty 1

## 2018-08-08 MED ORDER — GADOBUTROL 1 MMOL/ML IV SOLN
10.0000 mL | Freq: Once | INTRAVENOUS | Status: AC | PRN
  Administered 2018-08-08: 10 mL via INTRAVENOUS

## 2018-08-08 MED ORDER — ACETAMINOPHEN 500 MG PO TABS
500.0000 mg | ORAL_TABLET | Freq: Once | ORAL | Status: AC
  Administered 2018-08-08: 500 mg via ORAL
  Filled 2018-08-08: qty 1

## 2018-08-08 MED ORDER — CEPHALEXIN 250 MG PO CAPS
500.0000 mg | ORAL_CAPSULE | Freq: Once | ORAL | Status: AC
  Administered 2018-08-08: 500 mg via ORAL
  Filled 2018-08-08: qty 2

## 2018-08-08 MED ORDER — PROCHLORPERAZINE EDISYLATE 10 MG/2ML IJ SOLN
10.0000 mg | Freq: Once | INTRAMUSCULAR | Status: AC
  Administered 2018-08-08: 10 mg via INTRAVENOUS
  Filled 2018-08-08: qty 2

## 2018-08-08 MED ORDER — LORAZEPAM 2 MG/ML IJ SOLN
0.5000 mg | Freq: Once | INTRAMUSCULAR | Status: AC
  Administered 2018-08-08: 0.5 mg via INTRAVENOUS
  Filled 2018-08-08: qty 1

## 2018-08-08 MED ORDER — CEPHALEXIN 500 MG PO CAPS: 500.0000 mg | ORAL_CAPSULE | Freq: Four times a day (QID) | ORAL | 0 refills | Status: DC

## 2018-08-08 MED ORDER — DIPHENHYDRAMINE HCL 50 MG/ML IJ SOLN
25.0000 mg | Freq: Once | INTRAMUSCULAR | Status: AC
  Administered 2018-08-08: 25 mg via INTRAVENOUS
  Filled 2018-08-08: qty 1

## 2018-08-08 NOTE — ED Notes (Signed)
ED Provider at bedside. 

## 2018-08-08 NOTE — ED Notes (Signed)
Patient asleep and resting comfortably.  

## 2018-08-08 NOTE — ED Notes (Signed)
Pt returned from MRI, Per MRI tech: pt does not fit in the scanner. MD made aware.

## 2018-08-08 NOTE — ED Provider Notes (Signed)
Patient received in signout. She is awaiting MRI, and attempt at performing this study here at this facility was unsuccessful due to the patient's girth.  Patient will require transfer to our affiliated facility for additional MRI, evaluation. Patient is aware of need for transfer, agreeable to this.  Patient does have some ongoing discomfort, will receive Tylenol.   Gerhard Munch, MD 08/08/18 [phone]

## 2018-08-08 NOTE — ED Notes (Signed)
Patient transported to MRI 

## 2018-08-08 NOTE — ED Provider Notes (Signed)
10:08 PM Assumed care from Drs. Bero and Haviland, please see their note for full history, physical and decision making until this point. In brief this is a 58 y.o. year old female who presented to the ED tonight with Shortness of Breath     Multiple chronic issues but most pertinent is that she had slurred speech last night was seen at [facility] try to get an MRI however she was too big for the scanner so sent here for further evaluation.  Patient symptoms are resolved prior to my evaluation however will continue with the plan.  MRI negative.  Urine ultimately infected.  Culture added on.  Start on Keflex.  Patient appears well.  Patient will be discharged follow-up with PCP.  Discharge instructions, including strict return precautions for new or worsening symptoms, given. Patient and/or family verbalized understanding and agreement with the plan as described.   Labs, studies and imaging reviewed by myself and considered in medical decision making if ordered. Imaging interpreted by radiology.  Labs Reviewed  BASIC METABOLIC PANEL - Abnormal; Notable for the following components:      Result Value   Chloride 112 (*)    Glucose, Bld 176 (*)    Calcium 8.5 (*)    All other components within normal limits  TSH - Abnormal; Notable for the following components:   TSH <0.010 (*)    All other components within normal limits  URINALYSIS, ROUTINE W REFLEX MICROSCOPIC - Abnormal; Notable for the following components:   APPearance HAZY (*)    Specific Gravity, Urine 1.031 (*)    Hgb urine dipstick SMALL (*)    Nitrite POSITIVE (*)    Leukocytes, UA MODERATE (*)    WBC, UA >50 (*)    Bacteria, UA MANY (*)    All other components within normal limits  URINE CULTURE  BRAIN NATRIURETIC PEPTIDE  TROPONIN I  URINALYSIS, ROUTINE W REFLEX MICROSCOPIC    MR BRAIN W WO CONTRAST  Final Result    DG Chest 2 View  Final Result    CT Head Wo Contrast  Final Result      No follow-ups on  file.    Marily Memos, MD 08/08/18 2209

## 2018-08-08 NOTE — ED Triage Notes (Signed)
Pt BIB [facility] from home. Pt called 911 for SOB. Once EMS arrived she would only answer yes or no questions. Pt has had a previous stroke and has deficits to right side. Unknown of baseline. Pt was by herself and unable to elaborate on condition. Pt tachypneic with clear breath sounds. Sats 100 on room air upon arrival.

## 2018-08-08 NOTE — ED Notes (Signed)
Pt will stay here and wait for our MRI staff to come in

## 2018-08-08 NOTE — ED Provider Notes (Signed)
[facility] Emergency Department Provider Note MRN:  [id number]  Arrival date & time: 08/08/18     Chief Complaint   Slurred speech History of Present Illness   Yesenia [name] is a 58 y.o. year-old female with a history of CKD, stroke presenting to the ED with chief complaint of slurred speech.  Report of new slurred speech and/or trouble speaking that began last night at about 7 PM.  Symptoms have been constant.  Denies numbness weakness to the arms or legs, no recent fever.  Endorsing shortness of breath that is been present for months, left lower abdominal pain that is also been present for months.  Both of which are unchanged.  No exacerbating or relieving factors.  Transferred here from [facility] for MRI.  Review of Systems  A complete 10 system review of systems was obtained and all systems are negative except as noted in the HPI and PMH.   Patient's Health History    Past Medical History:  Diagnosis Date  . Abdominal pain, epigastric   . Abscess   . Acute stomach ulcer 08/15/2017  . AKI (acute kidney injury) (HCC)    Acute kidney injury on CKD stage II-III/notes 08/17/2017  . Anemia   . Anemia   . Arthritis    "knees" (08/17/2017)  . Bleeding 06/23/2016  . Blood transfusion without reported diagnosis 2012   anemia  . Cerebral infarction (HCC) 01/14/2013  . Chest pain 02/15/2013  . Chronic pain   . CKD (chronic kidney disease)    Stage 3  . Depression   . Fibromyalgia    hospitilized 12/16 due to inability to walk  . Gastric ulcer   . Graves disease   . History of gastritis 2019  . Hyperlipemia   . Hypertension   . Insomnia   . Iron deficiency anemia 05/26/2016  . Neuropathy   . Right sided weakness   . Sleep apnea    "CPAPs didn't work for me; that's why they did OR" (08/17/2017)  . Stroke [facility](HCC) 2014?   no residual weakness   . 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 Tremor 10/25/2017  . Type II diabetes mellitus (HCC)     does not check CBG's  (08/17/2017), no meds  . Vitamin deficiency    Vit D    Past Surgical History:  Procedure Laterality Date  . ANTERIOR INTEROSSEOUS NERVE DECOMPRESSION Right 06/16/2018   Procedure: RIGHT ULNAR NEUROPLATY AT THE ELBOW AND WRIST;  Surgeon: Mack Hookhompson, David, MD;  [facility];  Service: Orthopedics;  Laterality: Right;  . ESOPHAGOGASTRODUODENOSCOPY (EGD) WITH PROPOFOL N/A 08/16/2017   Procedure: ESOPHAGOGASTRODUODENOSCOPY (EGD) WITH PROPOFOL;  Surgeon: Benancio DeedsArmbruster, Steven P, MD;  [facility];  Service: Gastroenterology;  Laterality: N/A;  . KNEE ARTHROPLASTY  1997   "stuck rods in it"  . KNEE ARTHROSCOPY Right 1992, 1995,  . OOPHORECTOMY Right 1993?  . TRACHEOSTOMY TUBE PLACEMENT N/A 02/13/2016   Procedure: TRACHEOSTOMY;  Surgeon: Christia Readingwight Bates, MD;  [facility];  Service: ENT;  Laterality: N/A;  . TUBAL LIGATION      Family History  Problem Relation Age of Onset  . Breast cancer Mother   . Leukemia Brother   . Breast cancer Maternal Grandmother   . Colon cancer Neg Hx   . Esophageal cancer Neg Hx   . Rectal cancer Neg Hx   . Stomach cancer Neg Hx     Social History   Socioeconomic History  . Marital status: Divorced  Spouse name: Not on file  . Number of children: 2  . Years of education: 12th   . Highest education level: Not on file  Occupational History  . Occupation: SSI  Social Needs  . Financial resource strain: Not on file  . Food insecurity:    Worry: Not on file    Inability: Not on file  . Transportation needs:    Medical: Not on file    Non-medical: Not on file  Tobacco Use  . Smoking status: Never Smoker  . Smokeless tobacco: Never Used  Substance and Sexual Activity  . Alcohol use: No    Alcohol/week: 0.0 standard drinks  . Drug use: No  . Sexual activity: Never  Lifestyle  . Physical activity:    Days per week: Not on file    Minutes per session: Not on file  . Stress: Not on file  Relationships  . Social connections:    Talks on  phone: Not on file    Gets together: Not on file    Attends religious service: Not on file    Active member of club or organization: Not on file    Attends meetings of clubs or organizations: Not on file    Relationship status: Not on file  . Intimate partner violence:    Fear of current or ex partner: Not on file    Emotionally abused: Not on file    Physically abused: Not on file    Forced sexual activity: Not on file  Other Topics Concern  . Not on file  Social History Narrative   Reports no caffeine use      Physical Exam  Vital Signs and Nursing Notes reviewed Vitals:   08/08/18 0940 08/08/18 1316  BP: 134/77 121/66  Pulse: 87 79  Resp: (!) 22 18  Temp:    SpO2: 96% 99%    CONSTITUTIONAL: Well-appearing, NAD NEURO: Stuttered speech, alert and oriented x3, no other focal deficits EYES:  eyes equal and reactive ENT/NECK:  no LAD, no JVD CARDIO: Regular rate, well-perfused, normal S1 and S2 PULM:  CTAB no wheezing or rhonchi GI/GU:  normal bowel sounds, non-distended, non-tender MSK/SPINE:  No gross deformities, no edema SKIN:  no rash, atraumatic PSYCH:  Appropriate speech and behavior  Diagnostic and Interventional Summary    EKG Interpretation  Date/Time:  Tuesday August 08 2018 01:13:54 EST Ventricular Rate:  85 PR Interval:    QRS Duration: 96 QT Interval:  420 QTC Calculation: 500 R Axis:   11 Text Interpretation:  Sinus rhythm Left ventricular hypertrophy Borderline prolonged QT interval No significant change since last tracing Confirmed by Jacalyn LefevreHaviland, [name] [phone]([id number]) on 08/08/2018 1:30:20 AM Also confirmed by Jacalyn LefevreHaviland, [name] [phone]), editor Barbette Hairassel, Kerry [phone])  on 08/08/2018 7:08:58 AM      Labs Reviewed  BASIC METABOLIC PANEL - Abnormal; Notable for the following components:      Result Value   Chloride 112 (*)    Glucose, Bld 176 (*)    Calcium 8.5 (*)    All other components within normal limits  TSH - Abnormal; Notable for the following  components:   TSH <0.010 (*)    All other components within normal limits  BRAIN NATRIURETIC PEPTIDE  TROPONIN I  URINALYSIS, ROUTINE W REFLEX MICROSCOPIC    DG Chest 2 View  Final Result    CT Head Wo Contrast  Final Result    MR Brain W and Wo Contrast    (Results Pending)  Medications  LORazepam (ATIVAN) injection 1 mg (has no administration in time range)  LORazepam (ATIVAN) injection 0.5 mg (0.5 mg Intravenous Given 08/08/18 0132)  LORazepam (ATIVAN) injection 1 mg (1 mg Intravenous Given 08/08/18 0240)  LORazepam (ATIVAN) injection 1 mg (1 mg Intravenous Given 08/08/18 0720)  acetaminophen (TYLENOL) tablet 500 mg (500 mg Oral Given 08/08/18 1448)     Procedures Critical Care  ED Course and Medical Decision Making  I have reviewed the triage vital signs and the nursing notes.  Pertinent labs & imaging results that were available during my care of the patient were reviewed by me and considered in my medical decision making (see below for details).  Awaiting MRI to exclude completed stroke, otherwise low concern for other chronic complaints.  Signed out to Dr. Clayborne Dana at shift change.  Elmer Sow. Pilar Plate, MD [facility] Emergency Medicine [facility] [email or web address]  Final Clinical Impressions(s) / ED Diagnoses     ICD-10-CM   1. Hyperthyroidism E05.90   2. Speech disturbance, unspecified type R47.9     ED Discharge Orders    None         Sabas Sous, MD 08/09/18 [phone]

## 2018-08-08 NOTE — ED Notes (Signed)
Bed: OY77 Expected date:  Expected time:  Means of arrival:  Comments: 58 yr old short of breath

## 2018-08-08 NOTE — ED Notes (Signed)
Patient verbalizes understanding of discharge instructions. Opportunity for questioning and answers were provided. 

## 2018-08-08 NOTE — ED Notes (Signed)
Pt is speaking more fluently and is able to speak in full sentences once she came back from CT

## 2018-08-08 NOTE — Progress Notes (Signed)
Pt given ativan and attempted at [place] for MRI of the brain. Pt too large for closed bore scanner. Unable to manually push on table to get table to advance. Prev exams have been done on [facility] with wide bore scanner.

## 2018-08-08 NOTE — ED Provider Notes (Addendum)
[facility]-EMERGENCY DEPT Provider Note   CSN: [id number] Arrival date & time: 08/08/18  0100     History   Chief Complaint Chief Complaint  Patient presents with  . Shortness of Breath    HPI Yesenia [name] is a 58 y.o. female.  Pt presents to the ED today with SOB.  The pt is a poor historian, but sx have been going on for several weeks, but became worse tonight when she called EMS.  Pt also c/o headache.  She has seen her pcp for this and her methimazole was increased as her pcp thought sx were due to hyperthyroidism.  The pt denies n/v or f/c.     Past Medical History:  Diagnosis Date  . Abdominal pain, epigastric   . Abscess   . Acute stomach ulcer 08/15/2017  . AKI (acute kidney injury) (HCC)    Acute kidney injury on CKD stage II-III/notes 08/17/2017  . Anemia   . Anemia   . Arthritis    "knees" (08/17/2017)  . Bleeding 06/23/2016  . Blood transfusion without reported diagnosis 2012   anemia  . Cerebral infarction (HCC) 01/14/2013  . Chest pain 02/15/2013  . Chronic pain   . CKD (chronic kidney disease)    Stage 3  . Depression   . Fibromyalgia    hospitilized 12/16 due to inability to walk  . Gastric ulcer   . Graves disease   . History of gastritis 2019  . Hyperlipemia   . Hypertension   . Insomnia   . Iron deficiency anemia 05/26/2016  . Neuropathy   . Right sided weakness   . Sleep apnea    "CPAPs didn't work for me; that's why they did OR" (08/17/2017)  . Stroke [facility]) 2014?   no residual weakness   . 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 Tremor 10/25/2017  . Type II diabetes mellitus (HCC)     does not check CBG's (08/17/2017), no meds  . Vitamin deficiency    Vit D    Patient Active Problem List   Diagnosis Date Noted  . Osteoarthritis of both knees 05/25/2018  . Acute pain of left shoulder 05/25/2018  . Vitamin D deficiency 05/13/2018  . Pain 05/07/2018  . Chronic pain of both knees   . Hyperthyroidism 04/20/2018  .  Dysphagia 08/16/2017  . Nausea and vomiting   . MDD (major depressive disorder), recurrent severe, without psychosis (HCC)   . Acute kidney injury (HCC) 08/15/2017  . Acute on chronic respiratory failure with hypoxemia (HCC) 09/30/2016  . Irritable bowel syndrome 05/21/2016  . Fibromyalgia, primary 02/22/2016  . CKD stage 3 due to type 2 diabetes mellitus (HCC) 02/16/2016  . OSA (obstructive sleep apnea) 02/13/2016  . GERD (gastroesophageal reflux disease) 07/10/2015  . Morbid (severe) obesity due to excess calories (HCC) complicated by OSA 07/07/2015  . Type 2 diabetes mellitus with stage 3 chronic kidney disease, without long-term current use of insulin (HCC) 04/11/2013  . Essential hypertension 01/14/2013  . Anemia 01/14/2013    Past Surgical History:  Procedure Laterality Date  . ANTERIOR INTEROSSEOUS NERVE DECOMPRESSION Right 06/16/2018   Procedure: RIGHT ULNAR NEUROPLATY AT THE ELBOW AND WRIST;  Surgeon: Mack Hook, MD;  Location: [facility] OR;  Service: Orthopedics;  Laterality: Right;  . ESOPHAGOGASTRODUODENOSCOPY (EGD) WITH PROPOFOL N/A 08/16/2017   Procedure: ESOPHAGOGASTRODUODENOSCOPY (EGD) WITH PROPOFOL;  Surgeon: Benancio Deeds, MD;  Location[facility] ENDOSCOPY;  Service: Gastroenterology;  Laterality: N/A;  . KNEE ARTHROPLASTY  1997   "  stuck rods in it"  . KNEE ARTHROSCOPY Right 1992, 1995,  . OOPHORECTOMY Right 1993?  . TRACHEOSTOMY TUBE PLACEMENT N/A 02/13/2016   Procedure: TRACHEOSTOMY;  Surgeon: Christia Readingwight Bates, MD;  [facility];  Service: ENT;  Laterality: N/A;  . TUBAL LIGATION       OB History   No obstetric history on file.      Home Medications    Prior to Admission medications   Medication Sig Start Date End Date Taking? Authorizing Provider  acetaminophen (TYLENOL) 325 MG tablet Take 2 tablets (650 mg total) by mouth every 6 (six) hours. 06/16/18   Mack Hookhompson, David, MD  amLODipine (NORVASC) 5 MG tablet Take 1 tablet (5 mg total) by mouth daily. 05/16/18    Melene PlanKim, Rachel E, MD  atenolol (TENORMIN) 100 MG tablet Take 1 tablet (100 mg total) by mouth daily. 05/05/18   Melene PlanKim, Rachel E, MD  diclofenac sodium (VOLTAREN) 1 % GEL Apply 2 g topically every 8 (eight) hours as needed (knee pain). Apply to bilateral knees     [provider]  DULoxetine (CYMBALTA) 30 MG capsule Take 1 capsule (30 mg total) by mouth daily for 7 days, THEN 2 capsules (60 mg total) daily for 21 days. Patient taking differently: Take 2 capsules (60 mg total) daily 05/25/18 06/22/18  Melene PlanKim, Rachel E, MD  Eluxadoline (VIBERZI) 75 MG TABS Take 75 mg by mouth 2 (two) times daily. Take once a day for  2 days, then increase to twice a day thereafter 06/15/18   Benancio DeedsArmbruster, Steven P, MD  gabapentin (NEURONTIN) 300 MG capsule Take 900 mg by mouth at bedtime. 08/01/17   [provider]  hydrOXYzine (ATARAX/VISTARIL) 50 MG tablet Take 50 mg by mouth at bedtime. 05/31/18   [provider]  Lidocaine 4 % PTCH Place 1 patch onto the skin every 12 (twelve) hours. Apply to left knee topically daily  05/10/18   [provider]  methimazole (TAPAZOLE) 10 MG tablet Take 2 tablets (20 mg total) by mouth daily. 07/22/18   Melene PlanKim, Rachel E, MD  Omega-3 Fatty Acids (FISH OIL) 1000 MG CAPS Take 1,000 mg by mouth daily.  09/10/17   [provider]  ondansetron (ZOFRAN-ODT) 8 MG disintegrating tablet Take 1 tablet (8 mg total) by mouth every 8 (eight) hours as needed for nausea or vomiting. Patient taking differently: Take 8 mg by mouth 2 (two) times daily.  06/15/18   Armbruster, Willaim RayasSteven P, MD  pantoprazole (PROTONIX) 40 MG tablet Take 1 tablet (40 mg total) by mouth 2 (two) times daily. 08/21/17   Darlin DropHall, Carole N, DO  traZODone (DESYREL) 50 MG tablet Take 1 tablet (50 mg total) by mouth at bedtime. 07/22/18   Melene PlanKim, Rachel E, MD  Vitamin D, Ergocalciferol, (DRISDOL) 50000 units CAPS capsule Take 50,000 Units by mouth every Monday.  02/18/17   [provider]    Family  History Family History  Problem Relation Age of Onset  . Breast cancer Mother   . Leukemia Brother   . Breast cancer Maternal Grandmother   . Colon cancer Neg Hx   . Esophageal cancer Neg Hx   . Rectal cancer Neg Hx   . Stomach cancer Neg Hx     Social History Social History   Tobacco Use  . Smoking status: Never Smoker  . Smokeless tobacco: Never Used  Substance Use Topics  . Alcohol use: No    Alcohol/week: 0.0 standard drinks  . Drug use: No  Allergies   Buprenorphine hcl and Morphine and related   Review of Systems Review of Systems  Respiratory: Positive for shortness of breath.   Neurological: Positive for tremors, weakness and headaches.  All other systems reviewed and are negative.    Physical Exam Updated Vital Signs BP 111/78   Pulse 81   Temp 98.7 F (37.1 C) (Oral)   Resp (!) 26   SpO2 100%   Physical Exam Vitals signs and nursing note reviewed.  Constitutional:      Appearance: She is obese.  HENT:     Head: Normocephalic and atraumatic.     Mouth/Throat:     Mouth: Mucous membranes are moist.  Eyes:     Pupils: Pupils are equal, round, and reactive to light.  Neck:     Musculoskeletal: Normal range of motion and neck supple.  Cardiovascular:     Rate and Rhythm: Normal rate and regular rhythm.  Pulmonary:     Effort: Pulmonary effort is normal.     Breath sounds: Normal breath sounds.  Abdominal:     General: Bowel sounds are normal.     Palpations: Abdomen is soft.  Musculoskeletal: Normal range of motion.  Skin:    General: Skin is warm.     Capillary Refill: Capillary refill takes less than 2 seconds.  Neurological:     Mental Status: She is alert.     Comments: RUE weakness (chronic) Difficulty speaking      ED Treatments / Results  Labs (all labs ordered are listed, but only abnormal results are displayed) Labs Reviewed  BASIC METABOLIC PANEL - Abnormal; Notable for the following components:      Result Value    Chloride 112 (*)    Glucose, Bld 176 (*)    Calcium 8.5 (*)    All other components within normal limits  TSH - Abnormal; Notable for the following components:   TSH <0.010 (*)    All other components within normal limits  BRAIN NATRIURETIC PEPTIDE  TROPONIN I  URINALYSIS, ROUTINE W REFLEX MICROSCOPIC    EKG EKG Interpretation  Date/Time:  Tuesday August 08 2018 01:13:54 EST Ventricular Rate:  85 PR Interval:    QRS Duration: 96 QT Interval:  420 QTC Calculation: 500 R Axis:   11 Text Interpretation:  Sinus rhythm Left ventricular hypertrophy Borderline prolonged QT interval No significant change since last tracing Confirmed by Jacalyn LefevreHaviland, Haden Suder [phone]([id number]) on 08/08/2018 1:30:20 AM   Radiology Dg Chest 2 View  Result Date: 08/08/2018 CLINICAL DATA:  Shortness of breath. EXAM: CHEST - 2 VIEW COMPARISON:  02/16/2018 FINDINGS: Patient is rotated to the left. Low lung volumes. Prominent heart size likely accentuated by hypoaeration. Mild streaky bibasilar opacities. No evidence of pulmonary edema, pleural effusion or pneumothorax. No acute osseous abnormalities. IMPRESSION: 1. Low lung volumes with bibasilar atelectasis. 2. Prominent heart size, in part accentuated by low lung volumes. Electronically Signed   By: Narda RutherfordMelanie  Sanford M.D.   On: 08/08/2018 02:12   Ct Head Wo Contrast  Result Date: 08/08/2018 CLINICAL DATA:  Altered mental status EXAM: CT HEAD WITHOUT CONTRAST TECHNIQUE: Contiguous axial images were obtained from the base of the skull through the vertex without intravenous contrast. COMPARISON:  10/19/2017 FINDINGS: Study is degraded by motion. Brain: There is no mass, hemorrhage or extra-axial collection. There is generalized atrophy without lobar predilection. Hypodensity of the white matter is most commonly associated with chronic microvascular disease. Vascular: No abnormal hyperdensity of the major intracranial arteries or dural  venous sinuses. No intracranial atherosclerosis.  Skull: The visualized skull base, calvarium and extracranial soft tissues are normal. Sinuses/Orbits: No fluid levels or advanced mucosal thickening of the visualized paranasal sinuses. No mastoid or middle ear effusion. The orbits are normal. IMPRESSION: 1. Motion degraded examination. 2. No acute intracranial abnormality. 3. Generalized atrophy and findings of chronic microvascular ischemia. Electronically Signed   By: Deatra Robinson M.D.   On: 08/08/2018 02:07    Procedures Procedures (including critical care time)  Medications Ordered in ED Medications  LORazepam (ATIVAN) injection 0.5 mg (0.5 mg Intravenous Given 08/08/18 0132)  LORazepam (ATIVAN) injection 1 mg (1 mg Intravenous Given 08/08/18 0240)     Initial Impression / Assessment and Plan / ED Course  I have reviewed the triage vital signs and the nursing notes.  Pertinent labs & imaging results that were available during my care of the patient were reviewed by me and considered in my medical decision making (see chart for details).   Pt's slurred speech is new.  It may have started around 2000 yesterday, although that is unclear.  It is improving and she is able to give me more information, but she is not back to normal.  She is hyperthyroid, but not in thyroid storm.  She is not sob here, and has no signs of pna or lung problems.  Pt d/w Dr. Manus Gunning at [facility] who accepted pt for transfer for a MRI.  If that is normal, I think pt can go home.  If it is not, she will need to see neurology.  Unfortunately, there were no ambulances who could transfer pt to [facility] ED.  We will attempt to get an MRI here when staff arrives.  Hopefully, she will fit.  If she does not, she will need to go to [facility].   Final Clinical Impressions(s) / ED Diagnoses   Final diagnoses:  Hyperthyroidism  Speech disturbance, unspecified type    ED Discharge Orders    None       Jacalyn Lefevre, MD 08/08/18 [id number]    Jacalyn Lefevre, MD 08/08/18 [id number]     Jacalyn Lefevre, MD 08/08/18 [phone]

## 2018-08-08 NOTE — ED Notes (Signed)
[facility]. Pt is aware.

## 2018-08-08 NOTE — ED Notes (Signed)
Patient returned from MRI to hallway 25 yellow zone in ED

## 2018-08-08 NOTE — ED Notes (Signed)
Pt given [place] sandwich and sprite per Dr. Clayborne Dana order.

## 2018-08-08 NOTE — ED Notes (Signed)
Pt called her sister in [place]. Sister said that she spoke with the pt around 2030 tonight and she was completely oriented and was able to hold a conversation like normal. Pt is cognitively intact and CAOx4 normally. She had no previous deficits from a previous CVA. Right arm pain was from a previous injury and surgery. Information was relayed to Particia Nearing, MD.

## 2018-08-08 NOTE — ED Notes (Signed)
[facility] notified need for transport 

## 2018-08-10 ENCOUNTER — Ambulatory Visit: Payer: Self-pay

## 2018-08-11 LAB — URINE CULTURE: Culture: >=100000 — AB

## 2018-08-12 ENCOUNTER — Telehealth: Payer: Self-pay | Admitting: Emergency Medicine

## 2018-08-12 NOTE — Progress Notes (Signed)
ED Antimicrobial Stewardship Positive Culture Follow Up   Yesenia [name] is an 58 y.o. female who presented to [facility] on 08/08/2018 with a chief complaint of  Chief Complaint  Patient presents with  . Shortness of Breath    Recent Results (from the past 720 hour(s))  Urine Culture     Status: Abnormal   Collection Time: 08/08/18  6:35 PM  Result Value Ref Range Status   Specimen Description URINE, RANDOM  Final   Special Requests   Final    NONE Performed at [facility], [address] [place], [place] [address]    Culture (A)  Final    >=100,000 COLONIES/mL ESCHERICHIA COLI Confirmed Extended Spectrum Beta-Lactamase Producer (ESBL).  In bloodstream infections from ESBL organisms, carbapenems are preferred over piperacillin/tazobactam. They are shown to have a lower risk of mortality.    Report Status 08/11/2018 FINAL  Final   Organism ID, Bacteria ESCHERICHIA COLI (A)  Final      Susceptibility   Escherichia coli - MIC*    AMPICILLIN >=32 RESISTANT Resistant     CEFAZOLIN >=64 RESISTANT Resistant     CEFTRIAXONE >=64 RESISTANT Resistant     CIPROFLOXACIN 0.5 SENSITIVE Sensitive     GENTAMICIN >=16 RESISTANT Resistant     IMIPENEM <=0.25 SENSITIVE Sensitive     NITROFURANTOIN <=16 SENSITIVE Sensitive     TRIMETH/SULFA >=320 RESISTANT Resistant     AMPICILLIN/SULBACTAM 16 INTERMEDIATE Intermediate     PIP/TAZO <=4 SENSITIVE Sensitive     Extended ESBL POSITIVE Resistant     * >=100,000 COLONIES/mL ESCHERICHIA COLI   Dc keflex Add levoflox 250 mg ad x 3 d  Call for symptom check if AMS continues, return to ED for re-eval  ED Provider: Aviva Kluver, PA-C  Bertram Millard 08/12/2018, 8:42 AM Clinical Pharmacist Monday - Friday phone -  [phone] Saturday - Sunday phone - [phone]

## 2018-08-12 NOTE — Telephone Encounter (Signed)
Post ED Visit - Positive Culture Follow-up: Unsuccessful Patient Follow-up  Culture assessed and recommendations reviewed by:  []  Enzo Bi, Pharm.D. []  Celedonio Miyamoto, Pharm.D., BCPS AQ-ID [x]  Garvin Fila, Pharm.D., BCPS []  Georgina Pillion, [address].D., BCPS []  [place], [address].D., BCPS, AAHIVP []  Estella Husk, Pharm.D., BCPS, AAHIVP []  Sherlynn Carbon, PharmD []  Pollyann Samples, PharmD, BCPS  Positive Urine culture  []  Patient discharged without antimicrobial prescription and treatment is now indicated [x]  Organism is resistant to prescribed ED discharge antimicrobial []  Patient with positive blood cultures   Unable to contact patient at phone number on file, letter will be sent to address on file  Norm Parcel 08/12/2018, 3:23 PM

## 2018-08-15 ENCOUNTER — Encounter (HOSPITAL_COMMUNITY): Payer: Self-pay

## 2018-08-15 ENCOUNTER — Ambulatory Visit (HOSPITAL_COMMUNITY): Admit: 2018-08-15 | Payer: Medicaid Other | Admitting: Gastroenterology

## 2018-08-15 SURGERY — COLONOSCOPY WITH PROPOFOL
Anesthesia: Monitor Anesthesia Care

## 2018-08-16 ENCOUNTER — Other Ambulatory Visit: Payer: Self-pay

## 2018-08-16 ENCOUNTER — Telehealth: Payer: Self-pay | Admitting: Gastroenterology

## 2018-08-16 DIAGNOSIS — K529 Noninfective gastroenteritis and colitis, unspecified: Secondary | ICD-10-CM

## 2018-08-16 DIAGNOSIS — R159 Full incontinence of feces: Secondary | ICD-10-CM

## 2018-08-16 DIAGNOSIS — Z86010 Personal history of colonic polyps: Secondary | ICD-10-CM

## 2018-08-16 NOTE — Telephone Encounter (Signed)
Yesenia [name] is working with Dr. Adela LankArmbruster this week.

## 2018-08-16 NOTE — Telephone Encounter (Signed)
PT states she is returning [facility].

## 2018-08-16 NOTE — Telephone Encounter (Signed)
Called and spoke to pt. Scheduled her for colon with Armbruster on Monday, 09-18-18 at 10:15am. Pt understands she will need to arrive at [facility] at 8:45am and will need transportation. Previsit scheduled for 2-20.

## 2018-08-25 NOTE — Telephone Encounter (Signed)
Yesenia [name] tells me she wants to postpone her procedure until after  09-24-2018 because her daughter will be in town and that will be her transportation. I have cancelled her previsit for 2-220 because she has no transportation for that either.

## 2018-08-28 NOTE — Telephone Encounter (Signed)
Called and cancelled pt's colonoscopy for March 9th.  Will add her to the April hospital list.

## 2018-09-01 ENCOUNTER — Ambulatory Visit: Payer: Self-pay

## 2018-09-07 ENCOUNTER — Telehealth: Payer: Self-pay

## 2018-09-07 NOTE — Telephone Encounter (Signed)
Pt retuned call from Letter sent to home on 08/13/2018 from ED visit on 08/08/2018.  Recommendation for change in abx was made by Aviva Kluver PAC. Pt states she is back to normal and denies need for additional abx.

## 2018-09-08 ENCOUNTER — Other Ambulatory Visit: Payer: Self-pay | Admitting: Ophthalmology

## 2018-09-08 DIAGNOSIS — E0500 Thyrotoxicosis with diffuse goiter without thyrotoxic crisis or storm: Secondary | ICD-10-CM

## 2018-09-08 DIAGNOSIS — H532 Diplopia: Secondary | ICD-10-CM

## 2018-09-18 ENCOUNTER — Telehealth: Payer: Self-pay

## 2018-09-18 ENCOUNTER — Encounter (HOSPITAL_COMMUNITY): Payer: Self-pay

## 2018-09-18 ENCOUNTER — Ambulatory Visit (HOSPITAL_COMMUNITY): Admit: 2018-09-18 | Payer: Medicaid Other | Admitting: Gastroenterology

## 2018-09-18 SURGERY — COLONOSCOPY WITH PROPOFOL
Anesthesia: Monitor Anesthesia Care

## 2018-09-18 NOTE — Telephone Encounter (Signed)
Called and LM for pt to call back regarding scheduling her colon at [facility] on 4-7 in the am.

## 2018-09-20 NOTE — Telephone Encounter (Signed)
Left 2 more messages for pt to call back to discuss scheduling her colon at [facility] on 4-7.  Indicated today that since I have not heard back from her that I am removing her from that spot to make it available for other patients that are waiting.  I requested that she call back if she would like to be added to the May 2020 list and stressed the importance of follow up on colon ca screenings.

## 2018-09-27 ENCOUNTER — Other Ambulatory Visit: Payer: Self-pay

## 2018-11-27 ENCOUNTER — Other Ambulatory Visit: Payer: Self-pay | Admitting: Gastroenterology

## 2018-12-05 ENCOUNTER — Ambulatory Visit: Payer: Medicaid Other | Admitting: Family Medicine

## 2018-12-06 DIAGNOSIS — R300 Dysuria: Secondary | ICD-10-CM

## 2018-12-06 DIAGNOSIS — E785 Hyperlipidemia, unspecified: Secondary | ICD-10-CM | POA: Insufficient documentation

## 2018-12-06 DIAGNOSIS — R1030 Lower abdominal pain, unspecified: Secondary | ICD-10-CM | POA: Insufficient documentation

## 2018-12-06 DIAGNOSIS — M79606 Pain in leg, unspecified: Secondary | ICD-10-CM | POA: Insufficient documentation

## 2018-12-06 DIAGNOSIS — G8929 Other chronic pain: Secondary | ICD-10-CM | POA: Insufficient documentation

## 2018-12-06 DIAGNOSIS — M549 Dorsalgia, unspecified: Secondary | ICD-10-CM | POA: Insufficient documentation

## 2018-12-06 DIAGNOSIS — J449 Chronic obstructive pulmonary disease, unspecified: Secondary | ICD-10-CM | POA: Insufficient documentation

## 2018-12-06 DIAGNOSIS — G629 Polyneuropathy, unspecified: Secondary | ICD-10-CM | POA: Insufficient documentation

## 2018-12-06 HISTORY — DX: Dysuria: R30.0

## 2018-12-07 ENCOUNTER — Other Ambulatory Visit: Payer: Self-pay

## 2018-12-07 ENCOUNTER — Ambulatory Visit (INDEPENDENT_AMBULATORY_CARE_PROVIDER_SITE_OTHER): Payer: Medicaid Other | Admitting: Family Medicine

## 2018-12-07 ENCOUNTER — Encounter: Payer: Self-pay | Admitting: Family Medicine

## 2018-12-07 VITALS — BP 108/72 | HR 119

## 2018-12-07 DIAGNOSIS — Z23 Encounter for immunization: Secondary | ICD-10-CM

## 2018-12-07 DIAGNOSIS — R200 Anesthesia of skin: Secondary | ICD-10-CM

## 2018-12-07 DIAGNOSIS — Z0000 Encounter for general adult medical examination without abnormal findings: Secondary | ICD-10-CM

## 2018-12-07 DIAGNOSIS — F419 Anxiety disorder, unspecified: Secondary | ICD-10-CM | POA: Diagnosis not present

## 2018-12-07 DIAGNOSIS — E1169 Type 2 diabetes mellitus with other specified complication: Secondary | ICD-10-CM

## 2018-12-07 DIAGNOSIS — E1142 Type 2 diabetes mellitus with diabetic polyneuropathy: Secondary | ICD-10-CM | POA: Diagnosis not present

## 2018-12-07 DIAGNOSIS — E785 Hyperlipidemia, unspecified: Secondary | ICD-10-CM

## 2018-12-07 DIAGNOSIS — R000 Tachycardia, unspecified: Secondary | ICD-10-CM

## 2018-12-07 DIAGNOSIS — R062 Wheezing: Secondary | ICD-10-CM | POA: Diagnosis not present

## 2018-12-07 LAB — POCT GLYCOSYLATED HEMOGLOBIN (HGB A1C): HbA1c, POC (controlled diabetic range): 6.5 % (ref 0.0–7.0)

## 2018-12-07 MED ORDER — ALBUTEROL SULFATE HFA 108 (90 BASE) MCG/ACT IN AERS: 2.0000 | INHALATION_SPRAY | Freq: Four times a day (QID) | RESPIRATORY_TRACT | 0 refills | Status: DC | PRN

## 2018-12-07 NOTE — Progress Notes (Signed)
21 

## 2018-12-07 NOTE — Patient Instructions (Signed)
Dear [name],   It was very nice to see you! Thank you for taking your time to come in to be seen. Today, we discussed the following:   Leg Numbness   You do not have any red flag symptoms at this time.  Thus, we can be conservative with management.  This could be structural and from your lower back, possibly a disc protrusion.  Please continue to monitor your symptoms to see if you notice any changes in temperature of your legs, difficulty with movement, etc.    Please call if you experience any lower extremity weakness bladder or bowel incontinence, loss of feeling in your pelvis, worsened gait difficulties.  Options for treatment include physical therapy, weight loss, and obtaining imaging of your back.   Please follow up in 1 week to discuss any new symptoms and severity to determine further work up. We can make this a virtual visit so that you do not have to come into the office.  Wheezing   We have scheduled you for pulmonary function testing with Dr. Raymondo Band for your wheezing.  I have also sent albuterol to your pharmacy.  Please call if you have any worsening shortness of breath.  Diabetes   Your A1C is at goal   Be well,   Dr. Genia Hotter [facility] [phone]   Sign up for MyChart for instant access to your health profile, labs, orders, upcoming appointments or to contact your provider with questions.

## 2018-12-07 NOTE — Progress Notes (Addendum)
Established Patient - Clinic Visit Subjective  Subjective  Patient ID: MRN [id number], Date of birth: [birth year]  PCP: Melene PlanKim, Jasleen Riepe E, MD  CC: Numbness  HPI:  Yesenia [name] is a 58 y.o. female who presents to clinic for the following:  # Bilateral numbness in legs Started 1.5 weeks ago. No matter how she lies. Comes and goes through the night. Episodes last for about 15 minutes and goes away by itself. Patient reports two episodes per night. During episodes, patient feels. When she lies on her back, she feels the numbness more in her feet. Sleep on right side, still feels it in both legs. Happened for the first time during the day yesterday while she was sitting upright in a chair. No back pain. History of pinched nerve on left and right side.  No recent trauma or accidents. denies saddle anasthesia, significant weakness in lower extremities when not in episode, bowel or bladder incontinence, loss of feeling in the pelvic or perianal area, gait difficulties, or sexual dysfunction.   Wheezing  Started last Thursday. Never had it before.  Patient denies any history of asthma or COPD, though COPD is in her problem list.  She reports that she had to have a trach inserted during a hospitalization as she could not tolerate the CPAP mask due to claustrophobia.  She denies any new exposures or new animals.  Patient denies recurrent episodes of shortness of breath, though she does report having several episodes where she has to catch her breath.  ROS: See HPI HISTORY Meds  Allergies  PMHx: Reviewed as appropriate  Chronic obstructive lung disease, acute on chronic respiratory failure with hypoxemia, resolved OSA Social Hx: Celine MansSonja reports that she has never smoked. She has never used smokeless tobacco. She reports that she does not drink alcohol or use drugs.    Objective   Objective  Physical Exam:  BP 108/72   Pulse (!) 119   SpO2 97%  General: NAD, non-toxic, well-appearing, sitting in  chair. Difficulty standing up and getting to exam table. Heavy breathing  Integumentary: No obvious rashes, lesions, trauma on general exam. HEENT: [place]/AT. PERRLA. EOMI.  Cardiovascular: RRR, normal S1, S2. B/L 2+ RP & DP. mild BLEE Respiratory: End expiratory wheezing bilaterally. No IWOB.  Abdomen: + BS. NT, ND, soft to palpation.  Extremities: Warm and well perfused. Moving spontaneously. + right leg raise.  Back: TTP at lower thoracic -lumbar spine Neuro: A & O x4. CN grossly intact. No FND   Pertinent Labs & Imaging:  Reviewed in chart as appropriate    Assessment  Assessment & Plan  Wheezing Unclear etiology. Pt with history of acute on chronic(?) lung disease and obstructive lung dz, though no PFT in chart. Will set pt up for appt for PFT with Dr. Raymondo BandKoval and assess from there. Can also consider manifestation of hyperthyroidism. Other causes of SOB include OHS and a/c HF. No recent echo- most recent with G1DD. No crackles on exam   F/u PFT results   PRN albuterol   Bilateral leg numbness Symptoms consistent with positional exacerbation of numbness.  Patient reports that is only occurring once or twice per night about 15 minutes intervals.  Patient does have history of degenerative disc disease per MRI in October 2019 as well as facet arthropathy.  However, vague description makes this a difficult diagnosis.  Patient continue to monitor symptoms and follow-up in 2 to 3 weeks and to obtain more information from her episode.  Patient reports that this  is not consistent with her existing neuropathy and it is not painful.  Will consider physical therapy as this is the first-line treatment for back pain.  Patient does not have any red flag symptoms at this time so immediate imaging is not necessary.   Follow up in two weeks  Healthcare maintenance TDAP & Hep C. Hep C negative.   Anxiety Refill hydroxyzine for anxiety   Hyperlipidemia associated with type 2 diabetes mellitus (HCC) A1C  <7 at goal. Today, 6.5  Tachycardia HR normalized on exam compared to initial vitals. Suspect due to walking from waiting room and vital signs obtained quickly afterwards. Will continue to monitor closely.  Follow-up:  Future Appointments  Date Time Provider Department Center  12/12/2018  2:00 PM Shamleffer, Konrad Dolores, MD [facility] None  12/14/2018  4:30 PM Kathrin Ruddy [facility]     Genia Hotter, M.D. [facility]  PGY -1 12/10/2018, 5:39 PM

## 2018-12-08 ENCOUNTER — Encounter: Payer: Self-pay | Admitting: Family Medicine

## 2018-12-08 ENCOUNTER — Ambulatory Visit
Admission: RE | Admit: 2018-12-08 | Discharge: 2018-12-08 | Disposition: A | Discharge status: Home / Self Care | Payer: Medicaid Other | Source: Ambulatory Visit | Attending: Ophthalmology | Admitting: Ophthalmology

## 2018-12-08 DIAGNOSIS — R200 Anesthesia of skin: Secondary | ICD-10-CM | POA: Insufficient documentation

## 2018-12-08 DIAGNOSIS — Z0000 Encounter for general adult medical examination without abnormal findings: Secondary | ICD-10-CM | POA: Insufficient documentation

## 2018-12-08 DIAGNOSIS — H532 Diplopia: Secondary | ICD-10-CM

## 2018-12-08 DIAGNOSIS — R062 Wheezing: Secondary | ICD-10-CM | POA: Insufficient documentation

## 2018-12-08 DIAGNOSIS — E0500 Thyrotoxicosis with diffuse goiter without thyrotoxic crisis or storm: Secondary | ICD-10-CM

## 2018-12-08 DIAGNOSIS — F419 Anxiety disorder, unspecified: Secondary | ICD-10-CM | POA: Insufficient documentation

## 2018-12-08 LAB — HEPATITIS C ANTIBODY: Hep C Virus Ab: 0.1 s/co ratio (ref 0.0–0.9)

## 2018-12-08 NOTE — Assessment & Plan Note (Signed)
A1C <7 at goal. Today, 6.5

## 2018-12-08 NOTE — Assessment & Plan Note (Signed)
Symptoms consistent with positional exacerbation of numbness.  Patient reports that is only occurring once or twice per night about 15 minutes intervals.  Patient does have history of degenerative disc disease per MRI in October 2019 as well as facet arthropathy.  However, vague description makes this a difficult diagnosis.  Patient continue to monitor symptoms and follow-up in 2 to 3 weeks and to obtain more information from her episode.  Patient reports that this is not consistent with her existing neuropathy and it is not painful.  Will consider physical therapy as this is the first-line treatment for back pain.  Patient does not have any red flag symptoms at this time so immediate imaging is not necessary.   Follow up in two weeks

## 2018-12-08 NOTE — Assessment & Plan Note (Addendum)
Unclear etiology. Pt with history of acute on chronic(?) lung disease and obstructive lung dz, though no PFT in chart. Will set pt up for appt for PFT with Dr. Raymondo Band and assess from there. Can also consider manifestation of hyperthyroidism. Other causes of SOB include OHS and a/c HF. No recent echo- most recent with G1DD. No crackles on exam   F/u PFT results   PRN albuterol

## 2018-12-08 NOTE — Assessment & Plan Note (Signed)
Refill hydroxyzine for anxiety

## 2018-12-08 NOTE — Assessment & Plan Note (Addendum)
TDAP & Hep C. Hep C negative.

## 2018-12-10 NOTE — Assessment & Plan Note (Signed)
HR normalized on exam compared to initial vitals. Suspect due to walking from waiting room and vital signs obtained quickly afterwards. Will continue to monitor closely.

## 2018-12-12 ENCOUNTER — Encounter: Payer: Self-pay | Admitting: Internal Medicine

## 2018-12-12 ENCOUNTER — Ambulatory Visit (INDEPENDENT_AMBULATORY_CARE_PROVIDER_SITE_OTHER): Payer: Medicaid Other | Admitting: Internal Medicine

## 2018-12-12 ENCOUNTER — Other Ambulatory Visit: Payer: Self-pay

## 2018-12-12 VITALS — BP 128/88 | HR 74 | Temp 98.8°F | Ht 66.0 in | Wt 325.0 lb

## 2018-12-12 DIAGNOSIS — E0590 Thyrotoxicosis, unspecified without thyrotoxic crisis or storm: Secondary | ICD-10-CM

## 2018-12-12 DIAGNOSIS — E0500 Thyrotoxicosis with diffuse goiter without thyrotoxic crisis or storm: Secondary | ICD-10-CM | POA: Diagnosis not present

## 2018-12-12 DIAGNOSIS — R748 Abnormal levels of other serum enzymes: Secondary | ICD-10-CM

## 2018-12-12 LAB — HEPATIC FUNCTION PANEL
ALT: 12 U/L (ref 0–35)
AST: 11 U/L (ref 0–37)
Albumin: 3.8 g/dL (ref 3.5–5.2)
Alkaline Phosphatase: 135 U/L — ABNORMAL HIGH (ref 39–117)
Bilirubin, Direct: 0.1 mg/dL (ref 0.0–0.3)
Total Bilirubin: 0.3 mg/dL (ref 0.2–1.2)
Total Protein: 7.2 g/dL (ref 6.0–8.3)

## 2018-12-12 LAB — T4, FREE: Free T4: 0.64 ng/dL (ref 0.60–1.60)

## 2018-12-12 LAB — TSH: TSH: 1.37 u[IU]/mL (ref 0.35–4.50)

## 2018-12-12 NOTE — Progress Notes (Addendum)
Name: Yesenia [name]  MRN/ DOB: [id number], Jun 08, 1961    Age/ Sex: 58 y.o., female    PCP: Melene Plan, MD   Reason for Endocrinology Evaluation: Hyperthyroidism     Date of Initial Endocrinology Evaluation: 12/13/2018     HPI: Ms. Yesenia [name] is a 58 y.o. female with a past medical history of T2DM, HTN . The patient presented for initial endocrinology clinic visit on 12/13/2018 for consultative assistance with her hyperthyroidism.   Pt presented to [facility]  in 02/2018  with c/o palpitations, nausea associated with sob. Her TSH was suppressed at 0.006 uIU/mL. Repeat labs confirmed diagnosis of hyperthyroidism. Pt underwent thyroid uptake and scan in 04/2018 with a 24 hour I-123 uptake = 51.4% .  Pt was started on Methimazole in 04/2018.   Today she is compliance with methimazole intake, she denies any side effects.  She continues with palpitations and nervousness, she was under the impression she has lost weight but actually she has gained ~ 34 lbs in the past 5 months.  She continues with fatigue.  Pt states she has chronic abdominal pain and diarrhea, she sees GI    In March she has noted tearing of the eyes, denies proptosis. She is under the care of an ophthalmologist.  She also notes neck enlargement but not dysphagia, pain or hoarseness.   She is not on biotin  No prior expisure to radiation   No FH of thyroid disease.    HISTORY:  Past Medical History:  Past Medical History:  Diagnosis Date  . Abdominal pain, epigastric   . Abscess   . Acute constipation 02/16/2016  . Acute stomach ulcer 08/15/2017  . AKI (acute kidney injury) (HCC)    Acute kidney injury on CKD stage II-III/notes 08/17/2017  . Anemia   . Anemia   . Arthritis    "knees" (08/17/2017)  . Bleeding 06/23/2016  . Blood transfusion without reported diagnosis 2012   anemia  . Cerebral infarction (HCC) 01/14/2013  . Chest pain 02/15/2013  . Chronic pain   . CKD (chronic kidney  disease)    Stage 3  . Depression   . Dysuria 12/06/2018  . Fibromyalgia    hospitilized 12/16 due to inability to walk  . Gastric ulcer   . Graves disease   . History of gastritis 2019  . Hyperlipemia   . Hypertension   . Insomnia   . Iron deficiency anemia 05/26/2016  . Neuropathy   . Obstructive sleep apnea of adult 10/03/2015  . Right sided weakness   . Sleep apnea    "CPAPs didn't work for me; that's why they did OR" (08/17/2017)  . Stroke [facility]) 2014?   no residual weakness   . 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 Tremor 10/25/2017  . Type II diabetes mellitus (HCC)     does not check CBG's (08/17/2017), no meds  . Vitamin deficiency    Vit D   Past Surgical History:  Past Surgical History:  Procedure Laterality Date  . ANTERIOR INTEROSSEOUS NERVE DECOMPRESSION Right 06/16/2018   Procedure: RIGHT ULNAR NEUROPLATY AT THE ELBOW AND WRIST;  Surgeon: Mack Hook, MD;  Location[facility] OR;  Service: Orthopedics;  Laterality: Right;  . ESOPHAGOGASTRODUODENOSCOPY (EGD) WITH PROPOFOL N/A 08/16/2017   Procedure: ESOPHAGOGASTRODUODENOSCOPY (EGD) WITH PROPOFOL;  Surgeon: Benancio Deeds, MD;  Location: [facility] ENDOSCOPY;  Service: Gastroenterology;  Laterality: N/A;  . KNEE ARTHROPLASTY  1997   "stuck rods in it"  .  KNEE ARTHROSCOPY Right 1992, 1995,  . OOPHORECTOMY Right 1993?  . TRACHEOSTOMY TUBE PLACEMENT N/A 02/13/2016   Procedure: TRACHEOSTOMY;  Surgeon: Christia Reading, MD;  Location[facility] OR;  Service: ENT;  Laterality: N/A;  . TUBAL LIGATION        Social History:  reports that she has never smoked. She has never used smokeless tobacco. She reports that she does not drink alcohol or use drugs.  Family History: family history includes Breast cancer in her maternal grandmother and mother; Leukemia in her brother.   HOME MEDICATIONS: Allergies as of 12/12/2018      Reactions   Buprenorphine Hcl Hives   Morphine And Related Hives, Dermatitis      Medication List        Accurate as of December 12, 2018 11:59 PM. If you have any questions, ask your nurse or doctor.        albuterol 108 (90 Base) MCG/ACT inhaler Commonly known as:  VENTOLIN HFA Inhale 2 puffs into the lungs every 6 (six) hours as needed for wheezing or shortness of breath.   amLODipine 5 MG tablet Commonly known as:  NORVASC Take 1 tablet (5 mg total) by mouth daily.   atenolol 100 MG tablet Commonly known as:  TENORMIN Take 1 tablet (100 mg total) by mouth daily.   DULoxetine 30 MG capsule Commonly known as:  Cymbalta Take 1 capsule (30 mg total) by mouth daily for 7 days, THEN 2 capsules (60 mg total) daily for 21 days. Start taking on:  May 25, 2018 What changed:  See the new instructions.   gabapentin 300 MG capsule Commonly known as:  NEURONTIN Take 900 mg by mouth at bedtime.   hydrOXYzine 50 MG tablet Commonly known as:  ATARAX/VISTARIL Take 50 mg by mouth at bedtime.   hydrOXYzine 25 MG tablet Commonly known as:  ATARAX/VISTARIL TAKE ONE TABLET BY MOUTH AT BEDTIME FOR ANXIETY WITH  OF HYDROXYZINE TO MAKE    methimazole 10 MG tablet Commonly known as:  TAPAZOLE Take 2 tablets (20 mg total) by mouth daily.   nortriptyline 25 MG capsule Commonly known as:  PAMELOR take 1-3 capsules AT BEDTIME   ondansetron 8 MG disintegrating tablet Commonly known as:  ZOFRAN-ODT Take 1 tablet (8 mg total) by mouth every 8 (eight) hours as needed for nausea or vomiting.   pantoprazole 40 MG tablet Commonly known as:  PROTONIX Take 1 tablet (40 mg total) by mouth 2 (two) times daily.   traZODone 50 MG tablet Commonly known as:  DESYREL Take 1 tablet (50 mg total) by mouth at bedtime.   Vitamin D (Ergocalciferol) 1.25 MG (50000 UT) Caps capsule Commonly known as:  DRISDOL Take 50,000 Units by mouth every Monday.         REVIEW OF SYSTEMS: A comprehensive ROS was conducted with the patient and is negative except as per HPI and below:  Review of Systems   Constitutional: Positive for malaise/fatigue. Negative for weight loss.  HENT: Negative for congestion and sore throat.   Eyes: Positive for discharge. Negative for pain.  Respiratory: Negative for cough and shortness of breath.   Cardiovascular: Positive for palpitations. Negative for chest pain.  Gastrointestinal: Positive for abdominal pain and diarrhea.  Genitourinary: Negative for frequency.  Musculoskeletal: Positive for joint pain.  Skin: Negative.   Neurological: Positive for tingling. Negative for tremors.  Endo/Heme/Allergies: Negative for polydipsia.  Psychiatric/Behavioral: Negative for depression. The patient is nervous/anxious.        OBJECTIVE:  VS: BP 128/88 (BP Location: Left Arm, Patient Position: Sitting, Cuff Size: Large)   Pulse 74   Temp 98.8 F (37.1 C)   Ht  (1.676 m)   Wt (!) 325 lb (147.4 kg)   SpO2 99%   BMI 52.46 kg/m    Wt Readings from Last 3 Encounters:  12/12/18 (!) 325 lb (147.4 kg)  07/19/18 281 lb 9.6 oz (127.7 kg)  06/16/18 272 lb (123.4 kg)     EXAM: General: Pt appears uncomfortable but is in NAD  Hydration: Well-hydrated with moist mucous membranes and good skin turgor  Eyes: External eye exam normal without stare, lid lag or exophthalmos.  EOM intact.    Ears, Nose, Throat: Hearing: Grossly intact bilaterally Dental: Good dentition  Throat: Clear without mass, erythema or exudate  Neck: General: Supple without adenopathy. Thyroid: Thyroid size normal.  No goiter or nodules appreciated. No thyroid bruit.  Lungs: Clear with good BS bilat with no rales, rhonchi, or wheezes  Heart: Auscultation: RRR.  Abdomen: Normoactive bowel sounds, soft, nontender, without masses or organomegaly palpable  Extremities:  BL LE: No pretibial edema normal ROM and strength.  Skin: Hair: Texture and amount normal with gender appropriate distribution Skin Inspection: No rashes Skin Palpation: Skin temperature, texture, and thickness normal to  palpation  Neuro: Cranial nerves: II - XII grossly intact  Motor: Normal strength throughout DTRs: 2+ and symmetric in UE without delay in relaxation phase  Mental Status: Judgment, insight: Intact Orientation: Oriented to time, place, and person Mood and affect: No depression, anxiety, or agitation     DATA REVIEWED: Results for KEYANAH, KOZICKI (MRN [id number]) as of 12/13/2018 10:15  Ref. Range 12/12/2018 14:38  Alkaline Phosphatase Latest Ref Range: 39 - 117 U/L 135 (H)  Albumin Latest Ref Range: 3.5 - 5.2 g/dL 3.8  AST Latest Ref Range: 0 - 37 U/L 11  ALT Latest Ref Range: 0 - 35 U/L 12  Total Protein Latest Ref Range: 6.0 - 8.3 g/dL 7.2  Bilirubin, Direct Latest Ref Range: 0.0 - 0.3 mg/dL 0.1  Total Bilirubin Latest Ref Range: 0.2 - 1.2 mg/dL 0.3  WBC Latest Ref Range: 3.4 - 10.8 x10E3/uL 9.6  RBC Latest Ref Range: 3.77 - 5.28 x10E6/uL 4.34  Hemoglobin Latest Ref Range: 11.1 - 15.9 g/dL 40.9  HCT Latest Ref Range: 34.0 - 46.6 % 38.3  MCV Latest Ref Range: 79 - 97 fL 88  MCH Latest Ref Range: 26.6 - 33.0 pg 28.1  MCHC Latest Ref Range: 31.5 - 35.7 g/dL 81.1  RDW Latest Ref Range: 11.7 - 15.4 % 13.9  Neutrophils Latest Ref Range: Not Estab. % 52  Immature Granulocytes Latest Ref Range: Not Estab. % 0  NEUT# Latest Ref Range: 1.4 - 7.0 x10E3/uL 5.0  Lymphocyte # Latest Ref Range: 0.7 - 3.1 x10E3/uL 3.6 (H)  Monocytes Absolute Latest Ref Range: 0.1 - 0.9 x10E3/uL 0.7  Basophils Absolute Latest Ref Range: 0.0 - 0.2 x10E3/uL 0.1  Immature Grans (Abs) Latest Ref Range: 0.0 - 0.1 x10E3/uL 0.0  Lymphs Latest Ref Range: Not Estab. % 37  Monocytes Latest Ref Range: Not Estab. % 7  Basos Latest Ref Range: Not Estab. % 1  Eos Latest Ref Range: Not Estab. % 3  EOS (ABSOLUTE) Latest Ref Range: 0.0 - 0.4 x10E3/uL 0.3  TSH Latest Ref Range: 0.35 - 4.50 uIU/mL 1.37  T4,Free(Direct) Latest Ref Range: 0.60 - 1.60 ng/dL 9.14       Thyroid uptake and scan 04/28/2018  Elevated 4 hour and 24  hour radioactive iodine uptake within a centrally normal thyroid scan. Findings compatible with Graves disease, which may be amendable to therapy with radial labeled iodine.   ASSESSMENT/PLAN/RECOMMENDATIONS:   1. Hyperthyroidism secondary to Graves' Disease:  - Clinically she is hypothyroid  -We discussed that Graves' Disease is a result of an autoimmune condition involving the thyroid.   - We discussed with pt the benefits of methimazole in the Tx of hyperthyroidism, as well as the possible side effects/complications of anti-thyroid drug Tx (specifically detailing the rare, but serious side effect of agranulocytosis). She was informed of need for regular thyroid function monitoring while on methimazole to ensure appropriate dosage without over-treatment. As well, we discussed the possible side effects of methimazole including the chance of rash, the small chance of liver irritation/juandice and the <=1 in 300-400 chance of sudden onset agranulocytosis.  We discussed importance of going to ED promptly (and stopping methimazole) if shewere to develop significant fever with severe sore throat of other evidence of acute infection.     - We extensively discussed the various treatment options for hyperthyroidism and Graves disease including ablation therapy with radioactive iodine versus antithyroid drug treatment versus surgical therapy.  We recommended to the patient that we felt, at this time, that I-131 ablation therapy would be most optimal.  We discussed the various possible benefits versus side effects of the various therapies.   - I carefully explained to the patient that one of the consequences of I-131 ablation treatment would likely be permanent hypothyroidism which would require long-term replacement therapy with LT4.   Medications : Decrease Methimazole to 15 mg daily (1.5 tabs)  2. Graves' Disease:   - No extra-thyroidal manifestations of graves' disease   3. Morbid Obesity :    - We briefly discussed surgical options for weight loss surgery, this will be helpful in the setting of chronic joint pains and aches. Pt was not aware this is an option for her.  - I will defer a referral to a bariatric surgery center to her PCP    4. Elevated Alkaline Phosphatase:  - This could be due to increased bone resorption due to hyperthyroidism in the past or could be a side effects from methimazole, its not high enough to make any changes at this time.  - Will continue to monitor.   F/u in 8 weeks Labs in 4 weeks   Signed electronically by: Lyndle HerrlichAbby Jaralla Parv Manthey, MD  [facility]  [facility] [address] [place], [place] [address] Phone: [phone] FAX: [phone]   CC: Melene PlanKim, Rachel E, MD [address] [place] [address] Phone: [phone] Fax: [phone]   Return to Endocrinology clinic as below: Future Appointments  Date Time Provider Department Center  12/14/2018  4:30 PM Kathrin RuddyKoval, Peter G, [facility]

## 2018-12-12 NOTE — Patient Instructions (Signed)
We recommend that you follow these hyperthyroidism instructions at home:  1) Take Methimazole 20 mg daily  If you develop severe sore throat with high fevers OR develop unexplained yellowing of your skin, eyes, under your tongue, severe abdominal pain with nausea or vomiting --> then please get evaluated immediately.  2) Get repeat thyroid labs in 4 weeks .   It is ESSENTIAL to get follow-up labs to help avoid over or undertreatment of your hyperthyroidism - both of which can be dangerous to your health.

## 2018-12-13 ENCOUNTER — Other Ambulatory Visit: Payer: Self-pay | Admitting: Internal Medicine

## 2018-12-13 DIAGNOSIS — E0500 Thyrotoxicosis with diffuse goiter without thyrotoxic crisis or storm: Secondary | ICD-10-CM | POA: Insufficient documentation

## 2018-12-13 DIAGNOSIS — R748 Abnormal levels of other serum enzymes: Secondary | ICD-10-CM | POA: Insufficient documentation

## 2018-12-13 LAB — CBC WITH DIFFERENTIAL
Basophils Absolute: 0.1 10*3/uL (ref 0.0–0.2)
Basos: 1 %
EOS (ABSOLUTE): 0.3 10*3/uL (ref 0.0–0.4)
Eos: 3 %
Hematocrit: 38.3 % (ref 34.0–46.6)
Hemoglobin: 12.2 g/dL (ref 11.1–15.9)
Immature Grans (Abs): 0 10*3/uL (ref 0.0–0.1)
Immature Granulocytes: 0 %
Lymphocytes Absolute: 3.6 10*3/uL — ABNORMAL HIGH (ref 0.7–3.1)
Lymphs: 37 %
MCH: 28.1 pg (ref 26.6–33.0)
MCHC: 31.9 g/dL (ref 31.5–35.7)
MCV: 88 fL (ref 79–97)
Monocytes Absolute: 0.7 10*3/uL (ref 0.1–0.9)
Monocytes: 7 %
Neutrophils Absolute: 5 10*3/uL (ref 1.4–7.0)
Neutrophils: 52 %
RBC: 4.34 x10E6/uL (ref 3.77–5.28)
RDW: 13.9 % (ref 11.7–15.4)
WBC: 9.6 10*3/uL (ref 3.4–10.8)

## 2018-12-13 MED ORDER — METHIMAZOLE 10 MG PO TABS: 15.0000 mg | ORAL_TABLET | Freq: Every day | ORAL | 2 refills | Status: DC

## 2018-12-13 NOTE — Addendum Note (Signed)
Addended by: Scarlette Shorts on: 12/13/2018 10:21 AM   Modules accepted: Orders

## 2018-12-14 ENCOUNTER — Ambulatory Visit (INDEPENDENT_AMBULATORY_CARE_PROVIDER_SITE_OTHER): Payer: Medicaid Other | Admitting: Pharmacist

## 2018-12-14 ENCOUNTER — Other Ambulatory Visit: Payer: Self-pay

## 2018-12-14 ENCOUNTER — Encounter: Payer: Self-pay | Admitting: Pharmacist

## 2018-12-14 VITALS — BP 126/78 | HR 70 | Ht 67.0 in | Wt 319.0 lb

## 2018-12-14 DIAGNOSIS — G4733 Obstructive sleep apnea (adult) (pediatric): Secondary | ICD-10-CM

## 2018-12-14 DIAGNOSIS — G6289 Other specified polyneuropathies: Secondary | ICD-10-CM

## 2018-12-14 DIAGNOSIS — J984 Other disorders of lung: Secondary | ICD-10-CM | POA: Diagnosis not present

## 2018-12-14 DIAGNOSIS — J449 Chronic obstructive pulmonary disease, unspecified: Secondary | ICD-10-CM

## 2018-12-14 LAB — TRAB (TSH RECEPTOR BINDING ANTIBODY): TRAB: 2.48 IU/L — ABNORMAL HIGH (ref <=2.00)

## 2018-12-14 NOTE — Assessment & Plan Note (Signed)
Patient has been experiencing worsening SOB for ~2 months and taking albuterol HFA around the clock for the past few days. Poor effort/trials were collected with spirometry, but patient had good initial expiratory speed. Did not display obstructive or restrictive disease. Precepted with Dr. Manson Passey  - Referral placed for pulmonology evaluation - In the interim, encouraged patient to continue to use albuterol HFA PRN

## 2018-12-14 NOTE — Progress Notes (Signed)
Patient ID: Yesenia [name], female   DOB: 1960/12/25, 58 y.o.   MRN: [id number] I agree with Dr. Macky Lower documentation and management.

## 2018-12-14 NOTE — Progress Notes (Signed)
   S:    Patient arrives in moderate spirits, significant out of breath from walking to the waiting room to the office. It took >10 minutes for breathing to improve enough to attempt PFT. Presents for lung function evaluation.  Patient was referred by Dr. Selena Batten (referred on 12/07/2018) after most recent office visit.   Patient has a history of severe sleep apnea, seen by neurology and otolaryngology as far back as 2017. She was unable to tolerate CPAP due to clausterphobia, so underwent a tracheostomy in 02/2016. She had multiple complications after this including pneumonia, and was in a nursing home for period of time. She did not seem to tolerate the tracheostomy and it was removed in 03/2017. She also has a history of severe depression, with ECT in 2019. She re-established care with neurology in 12/2017 to be evaluated for CPAP again, however, sleep study at that time did not show significant OSA, so per neurology, CPAP was not recommended. In 04/2018, she established care with current PCP and presented with nervousness, jitteriness, dizziness, and SOB that was determined to be related to hyperthyroidism. She established with endocrinology earlier this week (12/12/2018). Also a history of hand surgery in 06/2018.   Today, she notes that her breathing has been worse for a few years. She endorses significant SOB when talking on the phone, as well as in other activities of daily living. She denies a history of asthma or COPD; denies a history of seasonal or topical allergies. Albuterol HFA was initiated last week with Dr. Selena Batten - patient notes that she has been taking it every 4 hours regularly over the past few days, and feels like it improves her breathing somewhat. She denies a history of using albuterol or any other inhaler, but chart review indicates a history of Proair use.  At visit with Dr. Selena Batten last week, she also complained of increased bilateral leg numbness/weakness. Today, she notes that this has remained  consistent. She notes that she is able to feel her jeans on her skin. Has never seen a back specialist. Notes that she occasionally has urinary incontinence with exertion.   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 O: Physical Exam Constitutional:      Appearance: Normal appearance.    Review of Systems  Respiratory: Positive for cough and shortness of breath.     Vitals:   12/14/18 1356  BP: 126/78  Pulse: 70  SpO2: 97%     See "scanned report" or Documentation Flowsheet (discrete results - PFTs) for  Spirometry results. Patient provided moderate effort while attempting spirometry.    A/P: Patient has been experiencing worsening SOB for ~2 months and taking albuterol HFA around the clock for the past few days. Poor effort/trials were collected with spirometry, but patient had good initial expiratory speed. Did not display obstructive or restrictive disease. Precepted with Dr. Manson Passey  - Referral placed for pulmonology evaluation - In the interim, encouraged patient to continue to use albuterol HFA PRN.   Chronic neuropathy - reevaluated by Dr. Manson Passey - Labs ordered.  Return to See Dr. Genia Hotter in 1-2 weeks.   Patient verbalized understanding of results and education.  Written pt instructions provided.   F/U PCP 2-3 weeks.   Total time in face to face counseling 60 minutes.  Patient seen with Crissie Figures PharmD Candidate and Catie Feliz Beam, PharmD, PGY2 Pharmacy Resident .

## 2018-12-14 NOTE — Patient Instructions (Signed)
It was great to see you today!  Your lung function tests did not show lung disease, but we weren't able to get good quality testing.    We have placed a referral for a lung specialist at [facility].    Schedule follow up with Dr. Selena Batten in 2-3 weeks.

## 2018-12-14 NOTE — Progress Notes (Signed)
Chart reviewed, will also follow-up with Dr. Selena Batten.  The patient has a history of tracheostomy placement for reported severe obstructive sleep apnea.  This was removed at the patient's request in the emergency room 2018.  The patient reports a year and a half or more of dyspnea and wheezing.  She reports she uses albuterol 4 times a day.  This somewhat relieves her symptoms of dyspnea.  She denies chest pain or change in her breathing today.  No lower extremity edema.   Reviewed her prior sleep study from 2019 which does not actually demonstrate any significant obstructive or/central sleep apnea.  The patient additionally reports a multiyear history of bilateral lower extremity numbness and weakness at times.  No recent falls in the last 6 months.  She reported bowel bladder incontinence in the fall and had a lumbar spine MRI which was nonrevealing for cause.  She had a normal B12 level at that time as well.  RPR testing not available.  HIV was previously negative x2.  She has chronic back pain in addition to this.  No history of spinal surgery.  No history of trauma to the back.  Given the patient's disordered breathing pattern and her lower extremity symptoms I am concerned for possible neuromuscular disorder less likely multiple sclerosis although this would fit her symptoms that she continues to have.  Will obtain labs and discuss further with Dr. Selena Batten.  Referral to pulmonary placed.  She will likely require thoracic spine imaging as well as referral to neurology for the above complaints.

## 2018-12-15 ENCOUNTER — Encounter: Payer: Self-pay | Admitting: Internal Medicine

## 2018-12-15 LAB — RPR: RPR Ser Ql: NONREACTIVE

## 2018-12-15 LAB — VITAMIN B12: Vitamin B-12: 375 pg/mL (ref 232–1245)

## 2018-12-22 ENCOUNTER — Institutional Professional Consult (permissible substitution): Payer: Medicaid Other | Admitting: Emergency Medicine

## 2019-01-04 ENCOUNTER — Institutional Professional Consult (permissible substitution): Payer: Medicaid Other | Admitting: Emergency Medicine

## 2019-01-08 ENCOUNTER — Other Ambulatory Visit: Payer: Self-pay | Admitting: Internal Medicine

## 2019-01-08 DIAGNOSIS — E0590 Thyrotoxicosis, unspecified without thyrotoxic crisis or storm: Secondary | ICD-10-CM

## 2019-01-09 ENCOUNTER — Other Ambulatory Visit: Payer: Medicaid Other

## 2019-01-22 ENCOUNTER — Encounter: Payer: Self-pay | Admitting: Emergency Medicine

## 2019-01-22 ENCOUNTER — Other Ambulatory Visit: Payer: Self-pay

## 2019-01-22 ENCOUNTER — Ambulatory Visit: Payer: Medicaid Other | Admitting: Emergency Medicine

## 2019-01-22 ENCOUNTER — Ambulatory Visit (INDEPENDENT_AMBULATORY_CARE_PROVIDER_SITE_OTHER): Payer: Medicaid Other

## 2019-01-22 VITALS — BP 124/86 | HR 72 | Ht 67.0 in | Wt 329.0 lb

## 2019-01-22 DIAGNOSIS — R0600 Dyspnea, unspecified: Secondary | ICD-10-CM

## 2019-01-22 NOTE — Assessment & Plan Note (Signed)
Suspect multifactorial, principally due to restrictive lung disease.  We will perform a chest x-ray and pulmonary function testing and depending on results consider evaluation for possible respiratory muscle weakness.  She is at risk for obstructive lung disease possibly even fixed upper airway obstruction given her tracheostomy.  Her most recent PSG did not show sleep disordered breathing but she may have an obesity hypoventilation syndrome even in absence of OSA.  She may benefit from a resting room air ABG going forward to establish hypercapnia.  Unfortunately she is unable to tolerate BiPAP due to claustrophobia so unclear whether this would be beneficial.  Would need to again broach the subject of tracheostomy which was fraught with complications the first time.

## 2019-01-22 NOTE — Progress Notes (Signed)
Subjective:    Patient ID: KORIN SETZLER, female    DOB: [birth year], 58 y.o.   MRN: [id number]  HPI Ms. Niehoff is a 58 year old never smoker with a history of hypertension, chronic renal insufficiency, CVA, peptic ulcer disease, diabetes, obstructive sleep apnea with a history of prior tracheostomy 01/2016, was decannulated in 2018 (Dr Redmond Baseman); couldn't tolerate CPAP.  She follows with family practice, had experienced some lower extremity neuropathy, some bowel/bladder incontinence that is under neurological evaluation.  She is referred today for progressive dyspnea. Can happen with exertion and at rest, with a lot of talking. She hears some wheeze - can wake her from sleep. She has used albuterol and may have had some weight. She has lost about 20 lbs since last year. She has chest heaviness, seems to be worst on her back in supine position.   Underwent spirometry 12/19/2018 which I reviewed, shows principally restriction.  She had a split-night sleep study 01/06/2018 that showed an AHI of 2.6/h, CPAP was not initiated.  CXR 07/2018 > atx without clear infiltrates. Some CM    Review of Systems  Constitutional: Positive for unexpected weight change. Negative for fever.  HENT: Positive for trouble swallowing. Negative for congestion, dental problem, ear pain, nosebleeds, postnasal drip, rhinorrhea, sinus pressure, sneezing and sore throat.   Eyes: Negative for redness and itching.  Respiratory: Positive for shortness of breath. Negative for cough, chest tightness and wheezing.   Cardiovascular: Positive for chest pain. Negative for palpitations and leg swelling.  Gastrointestinal: Positive for abdominal pain. Negative for nausea and vomiting.  Genitourinary: Negative for dysuria.  Musculoskeletal: Negative for joint swelling.  Skin: Negative for rash.  Neurological: Negative for headaches.  Hematological: Does not bruise/bleed easily.  Psychiatric/Behavioral: Negative for dysphoric mood. The  patient is not nervous/anxious.    Past Medical History:  Diagnosis Date  . Abdominal pain, epigastric   . Abscess   . Acute constipation 02/16/2016  . Acute stomach ulcer 08/15/2017  . AKI (acute kidney injury) ([place])    Acute kidney injury on CKD stage II-III/notes 08/17/2017  . Anemia   . Anemia   . Arthritis    "knees" (08/17/2017)  . Bleeding 06/23/2016  . Blood transfusion without reported diagnosis 2012   anemia  . Cerebral infarction ([place]) 01/14/2013  . Chest pain 02/15/2013  . Chronic pain   . CKD (chronic kidney disease)    Stage 3  . Depression   . Dysuria 12/06/2018  . Fibromyalgia    hospitilized 12/16 due to inability to walk  . Gastric ulcer   . Graves disease   . History of gastritis 2019  . Hyperlipemia   . Hypertension   . Insomnia   . Iron deficiency anemia 05/26/2016  . Neuropathy   . Obstructive sleep apnea of adult 10/03/2015  . Right sided weakness   . Sleep apnea    "CPAPs didn't work for me; that's why they did OR" (08/17/2017)  . Stroke [facility]) 2014?   no residual weakness   . Tracheostomy status ([place]) 05/23/2016  . Tremor 10/25/2017  . Type II diabetes mellitus ([facility])     does not check CBG's (08/17/2017), no meds  . Vitamin deficiency    Vit D     Family History  Problem Relation Age of Onset  . Breast cancer Mother   . Leukemia Brother   . Breast cancer Maternal Grandmother   . Colon cancer Neg Hx   . Esophageal cancer Neg Hx   .  Rectal cancer Neg Hx   . Stomach cancer Neg Hx      Social History   Socioeconomic History  . Marital status: Divorced    Spouse name: Not on file  . Number of children: 2  . Years of education: 12th   . Highest education level: Not on file  Occupational History  . Occupation: SSI  Social Needs  . Financial resource strain: Not on file  . Food insecurity    Worry: Not on file    Inability: Not on file  . Transportation needs    Medical: Not on file    Non-medical: Not on file  Tobacco Use  . Smoking  status: Never Smoker  . Smokeless tobacco: Never Used  Substance and Sexual Activity  . Alcohol use: No    Alcohol/week: 0.0 standard drinks  . Drug use: No  . Sexual activity: Never  Lifestyle  . Physical activity    Days per week: Not on file    Minutes per session: Not on file  . Stress: Not on file  Relationships  . Social Musicianconnections    Talks on phone: Not on file    Gets together: Not on file    Attends religious service: Not on file    Active member of club or organization: Not on file    Attends meetings of clubs or organizations: Not on file    Relationship status: Not on file  . Intimate partner violence    Fear of current or ex partner: Not on file    Emotionally abused: Not on file    Physically abused: Not on file    Forced sexual activity: Not on file  Other Topics Concern  . Not on file  Social History Narrative   Reports no caffeine use      Allergies  Allergen Reactions  . Buprenorphine Hcl Hives  . Morphine And Related Hives and Dermatitis     Outpatient Medications Prior to Visit  Medication Sig Dispense Refill  . albuterol (VENTOLIN HFA) 108 (90 Base) MCG/ACT inhaler Inhale 2 puffs into the lungs every 6 (six) hours as needed for wheezing or shortness of breath. 1 Inhaler 0  . amLODipine (NORVASC) 5 MG tablet Take 1 tablet (5 mg total) by mouth daily. 30 tablet 3  . atenolol (TENORMIN) 100 MG tablet Take 1 tablet (100 mg total) by mouth daily. 90 tablet 3  . gabapentin (NEURONTIN) 300 MG capsule Take 900 mg by mouth at bedtime.  1  . hydrOXYzine (ATARAX/VISTARIL) 25 MG tablet TAKE ONE TABLET BY MOUTH AT BEDTIME FOR ANXIETY WITH 50MG  OF HYDROXYZINE TO MAKE 75MG     . hydrOXYzine (ATARAX/VISTARIL) 50 MG tablet Take 50 mg by mouth at bedtime.  1  . methimazole (TAPAZOLE) 10 MG tablet Take 1.5 tablets (15 mg total) by mouth daily. 45 tablet 2  . nortriptyline (PAMELOR) 25 MG capsule take 1-3 capsules AT BEDTIME    . ondansetron (ZOFRAN-ODT) 8 MG  disintegrating tablet Take 1 tablet (8 mg total) by mouth every 8 (eight) hours as needed for nausea or vomiting. 90 tablet 1  . pantoprazole (PROTONIX) 40 MG tablet Take 1 tablet (40 mg total) by mouth 2 (two) times daily. 60 tablet 0  . traZODone (DESYREL) 50 MG tablet Take 1 tablet (50 mg total) by mouth at bedtime. 60 tablet 0  . Vitamin D, Ergocalciferol, (DRISDOL) 50000 units CAPS capsule Take 50,000 Units by mouth every Monday.   1  . DULoxetine (CYMBALTA) 30  MG capsule Take 1 capsule (30 mg total) by mouth daily for 7 days, THEN 2 capsules (60 mg total) daily for 21 days. (Patient taking differently: Take 2 capsules (60 mg total) daily) 49 capsule 0   No facility-administered medications prior to visit.         Objective:   Physical Exam Vitals:   01/22/19 1508  BP: 124/86  Pulse: 72  SpO2: 98%  Weight: (!) 329 lb (149.2 kg)  Height: 5\' 7"  (1.702 m)   Gen: Pleasant, morbidly obese, in no distress,  normal affect  ENT: No lesions,  mouth clear,  oropharynx clear, no postnasal drip  Neck: No JVD, no stridor, healed stoma site  Lungs: No use of accessory muscles, decreased basilar BS, no crackles or wheezing on normal respiration, no wheeze on forced expiration  Cardiovascular: distant, RRR, heart sounds normal, no murmur or gallops, no peripheral edema  Musculoskeletal: No deformities, no cyanosis or clubbing  Neuro: alert, awake, non focal  Skin: Warm, no lesions or rash, old burn R supraclavicular region.       Assessment & Plan:  Dyspnea Suspect multifactorial, principally due to restrictive lung disease.  We will perform a chest x-ray and pulmonary function testing and depending on results consider evaluation for possible respiratory muscle weakness.  She is at risk for obstructive lung disease possibly even fixed upper airway obstruction given her tracheostomy.  Her most recent PSG did not show sleep disordered breathing but she may have an obesity hypoventilation  syndrome even in absence of OSA.  She may benefit from a resting room air ABG going forward to establish hypercapnia.  Unfortunately she is unable to tolerate BiPAP due to claustrophobia so unclear whether this would be beneficial.  Would need to again broach the subject of tracheostomy which was fraught with complications the first time.  Levy Pupaobert Lujain Kraszewski, MD, PhD 01/22/2019, 3:46 PM [facility] [phone] or if no answer [phone]

## 2019-01-22 NOTE — Patient Instructions (Signed)
We will perform pulmonary function testing We will repeat your chest x-ray today We will perform a walking oximetry today on room air Follow-up with Dr. Lamonte Sakai next available to review these test results.  Depending on results we will decide whether to initiate any new treatments.

## 2019-01-23 ENCOUNTER — Other Ambulatory Visit: Payer: Self-pay | Admitting: Gastroenterology

## 2019-02-09 ENCOUNTER — Other Ambulatory Visit: Payer: Medicaid Other

## 2019-02-13 ENCOUNTER — Ambulatory Visit: Payer: Medicaid Other | Admitting: Internal Medicine

## 2019-02-13 ENCOUNTER — Telehealth: Payer: Self-pay

## 2019-02-13 NOTE — Telephone Encounter (Signed)
Have been trying to reach patient via phone (no My Chart) to schedule a Colonoscopy at the hospital. Left messages with no call back.

## 2019-02-15 ENCOUNTER — Other Ambulatory Visit: Payer: Self-pay | Admitting: Family Medicine

## 2019-02-15 MED ORDER — AMLODIPINE BESYLATE 5 MG PO TABS: 5.0000 mg | ORAL_TABLET | Freq: Every day | ORAL | 3 refills | Status: DC

## 2019-02-15 NOTE — Telephone Encounter (Signed)
Pt needs refill on her gabapentin and amlodipine sent to [facility]. Pt has been trying to request these refills for a week, but has been requesting them through her pharmacy and her pharmacy says they have sent [place] a few request. There is no documentation that we have gotten anything from her pharmacy. Please let pt know when this has been done.

## 2019-02-15 NOTE — Telephone Encounter (Signed)
Called to have her pharmacy about these medications.  I filled the amlodipine in the past and I am happy to do so again.  I do not see any recent refills on her gabapentin, last prescribed in her epic chart in 2019.  Asking per pharmacy, they report a Jolyn Nap has recently prescribed this for her.  I will not be filling this medication for this patient as I have never prescribed this for her before.  She will have asked that provider to refill that medication for her.  The pharmacy is going to let her know.  Wilber Oliphant, M.D.  PGY-2  Family Medicine  [phone] 02/15/2019 2:01 PM

## 2019-02-20 ENCOUNTER — Encounter: Payer: Self-pay | Admitting: Emergency Medicine

## 2019-02-20 ENCOUNTER — Other Ambulatory Visit: Payer: Self-pay

## 2019-02-20 ENCOUNTER — Ambulatory Visit (INDEPENDENT_AMBULATORY_CARE_PROVIDER_SITE_OTHER): Payer: Medicaid Other | Admitting: Emergency Medicine

## 2019-02-20 DIAGNOSIS — R0602 Shortness of breath: Secondary | ICD-10-CM

## 2019-02-20 MED ORDER — ALBUTEROL SULFATE HFA 108 (90 BASE) MCG/ACT IN AERS: 2.0000 | INHALATION_SPRAY | Freq: Four times a day (QID) | RESPIRATORY_TRACT | 5 refills | Status: DC | PRN

## 2019-02-20 NOTE — Progress Notes (Signed)
Virtual Visit via Telephone Note  I connected with SHALEKA BRINES on 02/20/19 at  4:15 PM EDT by telephone and verified that I am speaking with the correct person using two identifiers.  Location: Patient: home  Provider: office   I discussed the limitations, risks, security and privacy concerns of performing an evaluation and management service by telephone and the availability of in person appointments. I also discussed with the patient that there may be a patient responsible charge related to this service. The patient expressed understanding and agreed to proceed.   History of Present Illness: 58 year old never smoker, with hypertension, chronic renal insufficiency, prior stroke, GERD, diabetes and a prior tracheostomy (01/2016-2018) for obstructive sleep apnea.  She has been unable to tolerate CPAP.  She has been dealing with progressive dyspnea both at rest and with exertion, upper airway noise and wheeze.  She had restriction on spirometry 12/19/2018, most recent split-night PSG 01/06/2018 showed an AHI of 2.6/h and a retry of CPAP was deferred. Chest x-ray from 01/22/2019 reviewed by me, showed no infiltrates, no significant atelectatic change.  A walking oximetry last time did not show any desaturation after 1 lap.   Observations/Objective: She notes that she is having wheezing, happens intermittently at rest or with activity. She can sometimes localize it to her her throat. She has stable exertional dyspnea with activity walking through the house, climbing stairs. She has some cough, minimal. Occasional yellow sputum.   Assessment and Plan: Chronic dyspnea with a normal certain contribution of restrictive disease but also consider obstruction.  This may reflect fixed upper airway obstruction and/or bronchospasm/asthma.  I certainly suspect the former based on her clinical description and exam, history of tracheostomy in the past.  She needs pulmonary function testing so that I can evaluate her  flow volume loop.  If she does have obstructive lung disease she might benefit from bronchodilators.  Further she might need bronchoscopy to visualize her proximal trachea for obstruction.  No indication thus far that she needs to be on CPAP.  I will arrange for PFT and follow-up with her on the same day if at all possible.  Follow Up Instructions: PFT and OV on same day, next available.    I discussed the assessment and treatment plan with the patient. The patient was provided an opportunity to ask questions and all were answered. The patient agreed with the plan and demonstrated an understanding of the instructions.   The patient was advised to call back or seek an in-person evaluation if the symptoms worsen or if the condition fails to improve as anticipated.  I provided 23 minutes of non-face-to-face time during this encounter.   Collene Gobble, MD

## 2019-03-05 ENCOUNTER — Ambulatory Visit (INDEPENDENT_AMBULATORY_CARE_PROVIDER_SITE_OTHER): Payer: Medicaid Other | Admitting: Family Medicine

## 2019-03-05 ENCOUNTER — Other Ambulatory Visit: Payer: Self-pay

## 2019-03-05 ENCOUNTER — Encounter: Payer: Self-pay | Admitting: Family Medicine

## 2019-03-05 VITALS — BP 104/72 | HR 139 | Wt 331.2 lb

## 2019-03-05 DIAGNOSIS — R1011 Right upper quadrant pain: Secondary | ICD-10-CM

## 2019-03-05 MED ORDER — GABAPENTIN 300 MG PO CAPS: 900.0000 mg | ORAL_CAPSULE | Freq: Every day | ORAL | 2 refills | Status: DC

## 2019-03-05 NOTE — Progress Notes (Signed)
Established Patient - Acute Visit Patient ID: MRN [id number], 02-Mar-1961  PCP: Wilber Oliphant, MD  Subjective  CC: Lump under Right Breast  GBE:EFEOF Yesenia [name] is a 58 y.o. female who presents today with the following problems: Patient has a bump under her right breast that has been there for a few weeks. She reports that it has been getting bigger with time. She reports that there is pain whenever there is pressure on the area (e.g. when her breast is resting on the area). She points at an area on RUQ. She denies any pain with eating. The pain is constant and sharp-like. No nausea, vomiting, diarrhea, constipation. No fevers, chills, generalized tenderness.    ROS: Pertinent ROS included in HPI.  History: Medications, allergies, medical history, family history and social history were reviewed and edited as necessary.  Social Hx: Yesenia [name] reports that she has never smoked. She has never used smokeless tobacco. She reports that she does not drink alcohol or use drugs.   Objective   Physical Exam:  BP 104/72   Pulse (!) 139   Wt (!) 331 lb 3.2 oz (150.2 kg)   SpO2 98%   BMI 51.87 kg/m  Filed Weights   03/05/19 1610  Weight: (!) 331 lb 3.2 oz (150.2 kg)   General: generally appears to stable. She is breathing loudly, but this is not a new finding and has been present at all of her visits.    Abdomen: + BS. No erythema or rash on observation of area. There is localized TTP to RUQ at the midclavicular line that spans about 5 cm across. The area soft to palpation. No fluctuance, no purulence.  Extremities: Warm and well perfused. Moving spontaneously. I do not feel any nodule to palpable lump. She has TTP with soft palpation initially, but then pain with only deep palpation later on in the exam. POCUS performed and there was no acute finding subcutaneously, no obvious signs of inflammation in adipose tissue.  Negative Murphy sign  Pertinent Labs & Imaging:  None    Assessment  RUQ pain Lab  closed. Patient to return in the morning for LFT and lipase to rule out hepatobiliary issue. Patient has also been scheduled for RUQ U/S later this week to rule out any deeper signs of cellulitis or RUQ pathology. I am unable to feel the bump that patient reports and when I ask her again about the bump, she isn't able to tell me where the bump was originally. She is mildly distressed due to pain and breathing loudly (which is typical for her). She is also tachycardic, which is also baseline for her 2/2 to her hyperthyroidism. Can also consider subcutaneous adipose infection, trauma, rib fracture, though patient denies any falls or trauma. Can also consider pain from bra strap. Less likely to be pyelonephritis given anterior pain, and denies dysuria, frequency, urgency. Studies will help [place] to rule out more emergent causes of her RUQ pain.  I asked patient if she was in enough pain to warrant going to the ED at this time or if she would be able to wait for outpatient lab studies and outpatient ultrasound.  She reports that she would much rather go to outpatient follow-up and then go to the ED.  - provided patient with return precautions   - 03/09/19 RUQ U/S with no acute findings . Labs are also wnl with no transaminitis.  Alk phos is mildly elevated at 137, previously 135 in June 2020.  Prior to that,  all alk phos values were normal.  This could indicate a cholestatic process; however nothing was noted on ultrasound. -Attempted to call patient and let her know about her results and also ask about how the pain is.  Will try to call patient with results at a later time.  Orders Placed This Encounter  Procedures  . US Abdomen Limited RUQ  . Hepatic Function Panel  . Basic Metabolic Panel  . CBC  . Lipase    Health Maintenance Due  Topic Date Due  . PAP SMEAR-Modifier  08/11/1982  . MAMMOGRAM  03/01/2019  . INFLUENZA VACCINE  02/10/2019   Health Maintenance discussed with patient and patient agrees  to address when able.   Follow-up:  Future Appointments  Date Time Provider [place]  03/16/2019  2:00 PM [facility] None  03/20/2019  2:00 PM Shamleffer, Melanie Crazier, MD [facility] None      Wilber Oliphant, M.D.  PGY-2  Family Medicine  [phone] 03/09/2019 3:45 PM

## 2019-03-05 NOTE — Progress Notes (Deleted)
Breast lump  Noticed one month ago and reports it has gotten bigger.

## 2019-03-05 NOTE — Patient Instructions (Addendum)
Dear Yesenia [name],   It was good to see you! Thank you for taking your time to come in to be seen. Today, we discussed the following:   Right Upper Quadrant Pain    Come back and get labs tomorrow at 9:30AM  [facility] on Friday, 8/28  at 10:30AM  Take zofran as needed for nausea  If you develop fever, vomiting or severe abdominal pain, please go straight to the ER.   Sleeping    Follow up in 1-2 weeks   Fibromyalgia & Neuropathy   Refilled gabapentin   Be well,   [name], M.D   [facility] [phone]  *Sign up for MyChart for instant access to your health profile, labs, orders, upcoming appointments or to contact your provider with questions*  =================================================================================== Some important items for Yesenia [name]'s health:    Your Blood Pressure.  Should be LESS THAN 140/90 or 150/90 if you are over 65 BP Readings from Last 3 Encounters:  03/05/19 104/72  01/22/19 124/86  12/14/18 126/78    Your Weight History Wt Readings from Last 3 Encounters:  03/05/19 (!) 331 lb 3.2 oz (150.2 kg)  01/22/19 (!) 329 lb (149.2 kg)  12/14/18 (!) 319 lb (144.7 kg)    Body mass index is 51.87 kg/m.  BMI Classes Classification BMI Category (kg/m2)  Underweight < 18.5  Normal Weight 18.5-24.9  Overweight  25.0-29.9  Obese Class I 30.0-34.9  Obese Class II 35.0-39.9  Obese Class III  > or  = 40.0      Your last A1C     Every 3-6 months if you have diabetes Lab Results  Component Value Date   HGBA1C 6.5 12/07/2018    Your last Cholesterol   Every 1-5 years    Component Value Date/Time   CHOL 224 (H) 01/14/2013 0715   HDL 56 01/14/2013 0715   LDLCALC 141 (H) 01/14/2013 0715    Your last Blood Tests -  Once a year if you take medications    Component Value Date/Time   K 3.9 08/08/2018 0127   CREATININE 0.79 08/08/2018 0127   GLUCOSE 176 (H) 08/08/2018 0127    To Keep  You Healthy Your are due for the following Health Maintenance Items:  Health Maintenance Due  Topic Date Due  . PAP SMEAR-Modifier  08/11/1982  . MAMMOGRAM  03/01/2019  . INFLUENZA VACCINE  02/10/2019    Please schedule an appointment with your healthcare provider for any questions or concerns regarding your health or any of the items above.   Insomnia Insomnia is a sleep disorder that makes it difficult to fall asleep or stay asleep. Insomnia can cause fatigue, low energy, difficulty concentrating, mood swings, and poor performance at work or school. There are three different ways to classify insomnia:  Difficulty falling asleep.  Difficulty staying asleep.  Waking up too early in the morning. Any type of insomnia can be long-term (chronic) or short-term (acute). Both are common. Short-term insomnia usually lasts for three months or less. Chronic insomnia occurs at least three times a week for longer than three months. What are the causes? Insomnia may be caused by another condition, situation, or substance, such as:  Anxiety.  Certain medicines.  Gastroesophageal reflux disease (GERD) or other gastrointestinal conditions.  Asthma or other breathing conditions.  Restless legs syndrome, sleep apnea, or other sleep disorders.  Chronic pain.  Menopause.  Stroke.  Abuse of alcohol, tobacco, or illegal drugs.  Mental health  conditions, such as depression.  Caffeine.  Neurological disorders, such as Alzheimer's disease.  An overactive thyroid (hyperthyroidism). Sometimes, the cause of insomnia may not be known. What increases the risk? Risk factors for insomnia include:  Gender. Women are affected more often than men.  Age. Insomnia is more common as you get older.  Stress.  Lack of exercise.  Irregular work schedule or working night shifts.  Traveling between different time zones.  Certain medical and mental health conditions. What are the signs or  symptoms? If you have insomnia, the main symptom is having trouble falling asleep or having trouble staying asleep. This may lead to other symptoms, such as:  Feeling fatigued or having low energy.  Feeling nervous about going to sleep.  Not feeling rested in the morning.  Having trouble concentrating.  Feeling irritable, anxious, or depressed. How is this diagnosed? This condition may be diagnosed based on:  Your symptoms and medical history. Your health care provider may ask about: ? Your sleep habits. ? Any medical conditions you have. ? Your mental health.  A physical exam. How is this treated? Treatment for insomnia depends on the cause. Treatment may focus on treating an underlying condition that is causing insomnia. Treatment may also include:  Medicines to help you sleep.  Counseling or therapy.  Lifestyle adjustments to help you sleep better. Follow these instructions at home: Eating and drinking   Limit or avoid alcohol, caffeinated beverages, and cigarettes, especially close to bedtime. These can disrupt your sleep.  Do not eat a large meal or eat spicy foods right before bedtime. This can lead to digestive discomfort that can make it hard for you to sleep. Sleep habits   Keep a sleep diary to help you and your health care provider figure out what could be causing your insomnia. Write down: ? When you sleep. ? When you wake up during the night. ? How well you sleep. ? How rested you feel the next day. ? Any side effects of medicines you are taking. ? What you eat and drink.  Make your bedroom a dark, comfortable place where it is easy to fall asleep. ? Put up shades or blackout curtains to block light from outside. ? Use a white noise machine to block noise. ? Keep the temperature cool.  Limit screen use before bedtime. This includes: ? Watching TV. ? Using your smartphone, tablet, or computer.  Stick to a routine that includes going to bed and  waking up at the same times every day and night. This can help you fall asleep faster. Consider making a quiet activity, such as reading, part of your nighttime routine.  Try to avoid taking naps during the day so that you sleep better at night.  Get out of bed if you are still awake after 15 minutes of trying to sleep. Keep the lights down, but try reading or doing a quiet activity. When you feel sleepy, go back to bed. General instructions  Take over-the-counter and prescription medicines only as told by your health care provider.  Exercise regularly, as told by your health care provider. Avoid exercise starting several hours before bedtime.  Use relaxation techniques to manage stress. Ask your health care provider to suggest some techniques that may work well for you. These may include: ? Breathing exercises. ? Routines to release muscle tension. ? Visualizing peaceful scenes.  Make sure that you drive carefully. Avoid driving if you feel very sleepy.  Keep all follow-up visits as told  by your health care provider. This is important. Contact a health care provider if:  You are tired throughout the day.  You have trouble in your daily routine due to sleepiness.  You continue to have sleep problems, or your sleep problems get worse. Get help right away if:  You have serious thoughts about hurting yourself or someone else. If you ever feel like you may hurt yourself or others, or have thoughts about taking your own life, get help right away. You can go to your nearest emergency department or call:  Your local emergency services (911 in the [place]  A suicide crisis helpline, such as the [facility] at 702-067-9873. This is open 24 hours a day. Summary  Insomnia is a sleep disorder that makes it difficult to fall asleep or stay asleep.  Insomnia can be long-term (chronic) or short-term (acute).  Treatment for insomnia depends on the cause. Treatment  may focus on treating an underlying condition that is causing insomnia.  Keep a sleep diary to help you and your health care provider figure out what could be causing your insomnia. This information is not intended to replace advice given to you by your health care provider. Make sure you discuss any questions you have with your health care provider. Document Released: 06/25/2000 Document Revised: 06/10/2017 Document Reviewed: 04/07/2017 Elsevier Patient Education  2020 [employer].

## 2019-03-06 ENCOUNTER — Telehealth: Payer: Self-pay | Admitting: *Deleted

## 2019-03-06 ENCOUNTER — Other Ambulatory Visit: Payer: Self-pay

## 2019-03-06 ENCOUNTER — Other Ambulatory Visit: Payer: Medicaid Other

## 2019-03-06 ENCOUNTER — Other Ambulatory Visit: Payer: Self-pay | Admitting: Internal Medicine

## 2019-03-06 ENCOUNTER — Other Ambulatory Visit: Payer: Self-pay | Admitting: Family Medicine

## 2019-03-06 DIAGNOSIS — R0600 Dyspnea, unspecified: Secondary | ICD-10-CM

## 2019-03-06 DIAGNOSIS — E0590 Thyrotoxicosis, unspecified without thyrotoxic crisis or storm: Secondary | ICD-10-CM

## 2019-03-06 NOTE — Telephone Encounter (Signed)
Pt states that she was supposed to get something for pain called in yesterday due to the lump under her breast.  To PCP. Christen Bame, CMA

## 2019-03-07 LAB — BASIC METABOLIC PANEL
BUN/Creatinine Ratio: 20 (ref 9–23)
BUN: 21 mg/dL (ref 6–24)
CO2: 19 mmol/L — ABNORMAL LOW (ref 20–29)
Calcium: 9.2 mg/dL (ref 8.7–10.2)
Chloride: 107 mmol/L — ABNORMAL HIGH (ref 96–106)
Creatinine, Ser: 1.04 mg/dL — ABNORMAL HIGH (ref 0.57–1.00)
GFR calc Af Amer: 69 mL/min/{1.73_m2} (ref >59)
GFR calc non Af Amer: 60 mL/min/{1.73_m2} (ref >59)
Glucose: 151 mg/dL — ABNORMAL HIGH (ref 65–99)
Potassium: 4 mmol/L (ref 3.5–5.2)
Sodium: 144 mmol/L (ref 134–144)

## 2019-03-07 LAB — CBC
Hematocrit: 39.3 % (ref 34.0–46.6)
Hemoglobin: 12.1 g/dL (ref 11.1–15.9)
MCH: 26.7 pg (ref 26.6–33.0)
MCHC: 30.8 g/dL — ABNORMAL LOW (ref 31.5–35.7)
MCV: 87 fL (ref 79–97)
Platelets: 408 10*3/uL (ref 150–450)
RBC: 4.54 x10E6/uL (ref 3.77–5.28)
RDW: 14.6 % (ref 11.7–15.4)
WBC: 12.9 10*3/uL — ABNORMAL HIGH (ref 3.4–10.8)

## 2019-03-07 LAB — HEPATIC FUNCTION PANEL
ALT: 12 IU/L (ref 0–32)
AST: 14 IU/L (ref 0–40)
Albumin: 4.3 g/dL (ref 3.8–4.9)
Alkaline Phosphatase: 137 IU/L — ABNORMAL HIGH (ref 39–117)
Bilirubin Total: <0.2 mg/dL (ref 0.0–1.2)
Bilirubin, Direct: 0.07 mg/dL (ref 0.00–0.40)
Total Protein: 6.9 g/dL (ref 6.0–8.5)

## 2019-03-07 LAB — LIPASE: Lipase: 46 U/L (ref 14–72)

## 2019-03-09 ENCOUNTER — Encounter: Payer: Self-pay | Admitting: Family Medicine

## 2019-03-09 ENCOUNTER — Ambulatory Visit (HOSPITAL_COMMUNITY)
Admission: RE | Admit: 2019-03-09 | Discharge: 2019-03-09 | Disposition: A | Discharge status: Home / Self Care | Payer: Medicaid Other | Source: Ambulatory Visit | Attending: Family Medicine | Admitting: Family Medicine

## 2019-03-09 ENCOUNTER — Other Ambulatory Visit: Payer: Self-pay

## 2019-03-09 DIAGNOSIS — R1011 Right upper quadrant pain: Secondary | ICD-10-CM | POA: Insufficient documentation

## 2019-03-09 NOTE — Progress Notes (Signed)
Attempted to call patient twice about ultrasound findings and about labs. Please call patient to give her these results and ask for continuing pain. Assessment and Plan as below:  There was no abnormal hepatobiliary findings on right upper quadrant ultrasound though there was some steatosis.  No common bile duct dilation, no gallbladder wall thickening, no peri-cholecystic inflammation, no cholelithiasis.  There is no transaminitis and lipase is within normal limits.  Patient's white count is elevated to 12.9, however patient denies any recent fevers or signs of illness.  There is no abnormal inflammation subcutaneously consistent with infection.  Unfortunately, unable to determine whether there is a fracture of the rib, however, would not treat fractured rib if that is in fact the diagnosis. Can also consider pain from bra strap and acute musculoskeletal pain that will likely resolve on its own.  If the patient calls back, please let her know that her right upper quadrant ultrasound was negative and there was no findings on labs that would suggest any other causes of this right upper quadrant pain except for a mildly elevated white blood cell count which may indicate infection.  If patient continues to have pain, please have her schedule an appointment to come back in and be seen for further work-up.  If patient has any fevers or any other signs of illness, worsening abdominal pain, changes in stool color or caliber or frequency, please have her make an appointment to follow-up.  If she has a having severe abdominal pain, please direct the patient to go to the emergency room for further follow-up.  If she is not in pain or the pain is decreasing, patient does not have to be seen for follow-up.

## 2019-03-09 NOTE — Assessment & Plan Note (Signed)
Lab closed. Patient to return in the morning for LFT and lipase to rule out hepatobiliary issue. Patient has also been scheduled for RUQ U/S later this week to rule out any deeper signs of cellulitis or RUQ pathology. I am unable to feel the bump that patient reports and when I ask her again about the bump, she isn't able to tell me where the bump was originally. She is mildly distressed due to pain and breathing loudly (which is typical for her). She is also tachycardic, which is also baseline for her 2/2 to her hyperthyroidism. Can also consider subcutaneous adipose infection, trauma, rib fracture, though patient denies any falls or trauma. Can also consider pain from bra strap. Less likely to be pyelonephritis given anterior pain, and denies dysuria, frequency, urgency. Studies will help [place] to rule out more emergent causes of her RUQ pain.  I asked patient if she was in enough pain to warrant going to the ED at this time or if she would be able to wait for outpatient lab studies and outpatient ultrasound.  She reports that she would much rather go to outpatient follow-up and then go to the ED.  - provided patient with return precautions   - 03/09/19 RUQ U/S with no acute findings . Labs are also wnl with no transaminitis.  Alk phos is mildly elevated at 137, previously 135 in June 2020.  Prior to that, all alk phos values were normal.  This could indicate a cholestatic process; however nothing was noted on ultrasound. -Attempted to call patient and let her know about her results and also ask about how the pain is.  Will try to call patient with results at a later time.

## 2019-03-09 NOTE — Telephone Encounter (Signed)
Attempted to call patient twice about her results and to ask about pain medications.  There was no answer with 2 phone calls.  Did leave voicemail and directed patient to call the family medicine Center should she have any more questions or continue to have pain.  Please look at the result note if there are any questions of assessment and plan.

## 2019-03-12 ENCOUNTER — Telehealth: Payer: Self-pay

## 2019-03-12 NOTE — Telephone Encounter (Signed)
Patient calls nurse line to get results of [place] and lab work. Per Maudie Mercury note, informed her negative [place] and lab work. Patient was ok with this and had no further questions.

## 2019-03-16 ENCOUNTER — Telehealth (INDEPENDENT_AMBULATORY_CARE_PROVIDER_SITE_OTHER): Payer: Medicaid Other | Admitting: Family Medicine

## 2019-03-16 ENCOUNTER — Other Ambulatory Visit: Payer: Self-pay

## 2019-03-16 ENCOUNTER — Other Ambulatory Visit: Payer: Medicaid Other

## 2019-03-16 ENCOUNTER — Encounter: Payer: Self-pay | Admitting: Family Medicine

## 2019-03-16 DIAGNOSIS — R1011 Right upper quadrant pain: Secondary | ICD-10-CM

## 2019-03-20 ENCOUNTER — Ambulatory Visit: Payer: Medicaid Other | Admitting: Internal Medicine

## 2019-03-22 NOTE — Assessment & Plan Note (Signed)
It is reassuring that patient's pain has improved without any further intervention.  Her pain is likely due to obesity and increased pressure on this abdominal area patient is agreeable to putting an ice pack on it to help.  She is to follow-up if she has severe worsening of pain or if she starts to have pain with meals.

## 2019-03-22 NOTE — Progress Notes (Signed)
[place] Tele[facility] Visit  Patient consented to have virtual visit. Method of visit: Telephone  Encounter participants: Patient: Yesenia [name] - located at Home Provider: Wilber Oliphant - located at [facility] Others (if applicable): none  Chief Complaint: f/u on pain   HPI: Patient calls to follow up about pain management from her last visit and to discuss results of ultrasound.  Patient is notified that ultrasound results came back negative and that there were no acute findings except for some steatosis.  Patient reports that she continues to have pain under her breasts and in the right upper quadrant area.  She is wondering what she should do about the pain.  She reports that her already prescribed pain medications do not help with this pain.  She reports that she is still able to function throughout the day.  She does report that the pain has improved but not resolved.  Patient denies any fever, chills, other abdominal pains, diarrhea, constipation.  She denies any pain with eating or meals.  ROS: per HPI  Pertinent PMHx: Anxiety, obesity   Exam:  Respiratory: wnl. No increased WOB appreciated   Assessment/Plan:  Right upper quadrant pain It is reassuring that patient's pain has improved without any further intervention.  Her pain is likely due to obesity and increased pressure on this abdominal area patient is agreeable to putting an ice pack on it to help.  She is to follow-up if she has severe worsening of pain or if she starts to have pain with meals.    Time spent during visit with patient: 10 minutes  Wilber Oliphant, M.D.  PGY-2  Family Medicine  [phone] 03/22/2019 2:23 PM

## 2019-04-11 ENCOUNTER — Telehealth: Payer: Self-pay | Admitting: Emergency Medicine

## 2019-04-11 NOTE — Telephone Encounter (Signed)
Called and spoke to patient to attempt to schedule pre-procedure covid testing for PFT scheduled for 04/17/2019.  Patient stated that she will need to cancel both PFT and OV due to lack of transportation. Patient stated that she relies on her daughter for transportation and her car is having problems currently and can't get the car fixed till middle of October. Patient stated she had tried to contact others for rides and was unsuccessful. Patient stated she will call back to reschedule both the PFT and OV.   Routing to Dr. Lamonte Sakai as Juluis Rainier.

## 2019-04-17 ENCOUNTER — Ambulatory Visit: Payer: Medicaid Other | Admitting: Emergency Medicine

## 2019-04-23 ENCOUNTER — Other Ambulatory Visit: Payer: Medicaid Other

## 2019-04-26 ENCOUNTER — Ambulatory Visit: Payer: Medicaid Other | Admitting: Internal Medicine

## 2019-05-28 ENCOUNTER — Other Ambulatory Visit: Payer: Self-pay | Admitting: *Deleted

## 2019-05-28 MED ORDER — HYDROXYZINE HCL 25 MG PO TABS: ORAL_TABLET | ORAL | 5 refills | Status: DC

## 2019-06-12 ENCOUNTER — Other Ambulatory Visit: Payer: Self-pay | Admitting: *Deleted

## 2019-06-12 ENCOUNTER — Other Ambulatory Visit: Payer: Self-pay | Admitting: Family Medicine

## 2019-06-12 NOTE — Telephone Encounter (Signed)
Remeron was stopped by GI due to lack of efficacy in setting of nausea. Patient follows with endocrinology and should get refills from Dr. Kelton Pillar.

## 2019-06-13 ENCOUNTER — Other Ambulatory Visit: Payer: Self-pay | Admitting: *Deleted

## 2019-06-13 NOTE — Telephone Encounter (Signed)
Pt calling to check status. Yesenia [name], CMA  

## 2019-06-14 MED ORDER — GABAPENTIN 300 MG PO CAPS: 900.0000 mg | ORAL_CAPSULE | Freq: Every day | ORAL | 2 refills | Status: DC

## 2019-06-14 MED ORDER — TRAZODONE HCL 50 MG PO TABS: 50.0000 mg | ORAL_TABLET | Freq: Every day | ORAL | 0 refills | Status: DC

## 2019-06-14 MED ORDER — PANTOPRAZOLE SODIUM 40 MG PO TBEC: 40.0000 mg | DELAYED_RELEASE_TABLET | Freq: Two times a day (BID) | ORAL | 0 refills | Status: DC

## 2019-06-29 ENCOUNTER — Other Ambulatory Visit: Payer: Self-pay

## 2019-06-30 ENCOUNTER — Other Ambulatory Visit: Payer: Self-pay | Admitting: Family Medicine

## 2019-07-04 ENCOUNTER — Telehealth: Payer: Self-pay | Admitting: *Deleted

## 2019-07-04 NOTE — Telephone Encounter (Signed)
Pt previously received Remeron from rehab for her knee.  She no longer goes there and wonders if Dr. Maudie Mercury would be willing to prescribe it now. Christen Bame, CMA

## 2019-07-17 ENCOUNTER — Other Ambulatory Visit: Payer: Self-pay | Admitting: Family Medicine

## 2019-07-19 ENCOUNTER — Other Ambulatory Visit: Payer: Self-pay

## 2019-07-20 NOTE — Telephone Encounter (Signed)
I do not prescribe this medication for the patient. I have received multiple Rx requests from her. Please ask her make a virtual appointment if she has issues with going to any of her other providers.

## 2019-07-20 NOTE — Telephone Encounter (Signed)
Pt has an appt with Dr. Selena Batten on 07/27/2019. Sunday Spillers, CMA

## 2019-07-24 ENCOUNTER — Telehealth: Payer: Self-pay | Admitting: Emergency Medicine

## 2019-07-24 DIAGNOSIS — R0602 Shortness of breath: Secondary | ICD-10-CM

## 2019-07-24 NOTE — Telephone Encounter (Signed)
Spoke with the pt  She is calling to schedule rov and PFT with [facility]  Appts scheduled for 08/02/2019  Will forward to [facility] marked urgent for covid test to be scheduled accordingly  Thanks!

## 2019-07-24 NOTE — Telephone Encounter (Signed)
Spoke to pt & scheduled covid test.  Nothing further needed. 

## 2019-07-27 ENCOUNTER — Ambulatory Visit (INDEPENDENT_AMBULATORY_CARE_PROVIDER_SITE_OTHER): Payer: Medicaid Other | Admitting: Family Medicine

## 2019-07-27 ENCOUNTER — Encounter: Payer: Self-pay | Admitting: Family Medicine

## 2019-07-27 ENCOUNTER — Other Ambulatory Visit: Payer: Self-pay

## 2019-07-27 VITALS — BP 106/76 | HR 64 | Wt 327.6 lb

## 2019-07-27 DIAGNOSIS — E1142 Type 2 diabetes mellitus with diabetic polyneuropathy: Secondary | ICD-10-CM

## 2019-07-27 DIAGNOSIS — L304 Erythema intertrigo: Secondary | ICD-10-CM | POA: Diagnosis not present

## 2019-07-27 DIAGNOSIS — G4720 Circadian rhythm sleep disorder, unspecified type: Secondary | ICD-10-CM | POA: Diagnosis not present

## 2019-07-27 DIAGNOSIS — M797 Fibromyalgia: Secondary | ICD-10-CM

## 2019-07-27 DIAGNOSIS — E0590 Thyrotoxicosis, unspecified without thyrotoxic crisis or storm: Secondary | ICD-10-CM

## 2019-07-27 LAB — POCT GLYCOSYLATED HEMOGLOBIN (HGB A1C): HbA1c, POC (controlled diabetic range): 6.5 % (ref 0.0–7.0)

## 2019-07-27 MED ORDER — RAMELTEON 8 MG PO TABS: 8.0000 mg | ORAL_TABLET | Freq: Every evening | ORAL | 1 refills | Status: DC | PRN

## 2019-07-27 MED ORDER — METHIMAZOLE 10 MG PO TABS: 15.0000 mg | ORAL_TABLET | Freq: Every day | ORAL | 0 refills | Status: DC

## 2019-07-27 MED ORDER — DESITIN MULTI-PURPOSE HEALING EX OINT: 1.0000 "application " | TOPICAL_OINTMENT | Freq: Two times a day (BID) | CUTANEOUS | 0 refills | Status: AC

## 2019-07-27 NOTE — Progress Notes (Signed)
Subjective  CC: Insomnia  KGU:RKYHC R Levier is a 58 y.o. female who presents today with the following problems:  Insomnia  To try to sleep: going up and down the stairs. Has been having trouble sleeping for a long time. Up all night, but goes to sleep around 1PM and wakes up at 5-6PM.  Took daughter's klonopin. Sometimes take trazadone and atarax together, but reports that this doesn't work after a few days in a row.   Rash  Started on Monday evening with itching. Has never had before. Located under her breast. Denies any rash in other intertriginous locations.   Hyperthyroid: Patient requesting methimazole refill as she is almost out and has not seen her endocrinologist since August. Looks like she has canceled more recent appointments. I will send methimazole to pharmacy, but patient will need to call endocrinologist for future   DM  Patient is also wanting to check her A1C today.   Current Outpatient Medications:  .  albuterol (VENTOLIN HFA) 108 (90 Base) MCG/ACT inhaler, Inhale 2 puffs into the lungs every 6 (six) hours as needed for wheezing or shortness of breath., Disp: 18 g, Rfl: 5 .  amLODipine (NORVASC) 5 MG tablet, Take 1 tablet (5 mg total) by mouth daily., Disp: 90 tablet, Rfl: 3 .  atenolol (TENORMIN) 100 MG tablet, TAKE ONE TABLET BY MOUTH EVERY DAY FOR HIGH BLOOD PRESSURE, Disp: 90 tablet, Rfl: 3 .  gabapentin (NEURONTIN) 300 MG capsule, Take 3 capsules (900 mg total) by mouth at bedtime., Disp: 90 capsule, Rfl: 2 .  hydrOXYzine (ATARAX/VISTARIL) 50 MG tablet, TAKE ONE TABLET BY MOUTH AT BEDTIME FOR ANXIETY WITH 25MG  HYDROXYZINE TO MAKE 75 MG, Disp: 90 tablet, Rfl: 2 .  methimazole (TAPAZOLE) 10 MG tablet, Take 1.5 tablets (15 mg total) by mouth daily., Disp: 45 tablet, Rfl: 0 .  ondansetron (ZOFRAN-ODT) 8 MG disintegrating tablet, Take 1 tablet (8 mg total) by mouth every 8 (eight) hours as needed for nausea or vomiting., Disp: 90 tablet, Rfl: 1 .  pantoprazole  (PROTONIX) 40 MG tablet, Take 1 tablet (40 mg total) by mouth 2 (two) times daily., Disp: 60 tablet, Rfl: 0 .  traZODone (DESYREL) 50 MG tablet, Take 1 tablet (50 mg total) by mouth at bedtime., Disp: 60 tablet, Rfl: 0 .  Diaper Rash Products (DESITIN MULTI-PURPOSE HEALING) OINT, Apply 1 application topically 2 (two) times daily., Disp: 397 g, Rfl: 0 .  ramelteon (ROZEREM) 8 MG tablet, Take 1 tablet (8 mg total) by mouth at bedtime as needed for sleep., Disp: 30 tablet, Rfl: 1  Objective  Physical Exam:  BP 106/76   Pulse 64   Wt (!) 327 lb 9.6 oz (148.6 kg)   SpO2 98%   BMI 51.31 kg/m  Well appearing female. No trembling or tachycardia. Erythematous diffuse rash under breasts. No maceration. No abnormal smell. Excoriations present.   Assessment & Plan    Problem List Items Addressed This Visit      Active Problems   Type 2 diabetes mellitus with peripheral neuropathy ([place]) - Primary    Currently not taking metformin. Her Al1c is 6.5 today, which makes her diabetic but well controlled with diet and without medicaiton. Recheck A1C in 6 months. Patient not currently taking statin.       Relevant Medications   ramelteon (ROZEREM) 8 MG tablet   Other Relevant Orders   HgB A1c (Completed)   Circadian dysregulation    Patient seen for sleep study and sleep apnea was ruled  out, per patient.  She is currently not taking any medications that would make her stay up.  She does report watching TV to fall asleep.  Poor overall sleep hygiene.  Discussed other medications that may be habit-forming and may not fix the issue.  She is able to sleep, though it does during the day, which makes me think more of a possible circadian disorder and/or may be behavioral component.  Has already tried melatonin without relief.  Patient is not working at this time and so her sleeping issues are not abnormal for her as they do not impair typical daily life. -Stop taking over-the-counter medications as these can be  habit-forming -Start ramelteon, follow-up in 1 month with sleep journal and improved compliance with sleep hygiene - Can consider light therapy at that time.      Primary fibromyalgia syndrome    Is no longer taking cymbalta or nortiptyline       Hyperthyroidism    Follows with Dr. Lonzo Cloud. Patient reports she is almost out of her medication. Will refill for one month, but patient is aware that she will need to get all future refills with Dr. Lonzo Cloud. I recommend she make a f/u appt with her as she has not been seen. Did order TSH today as it does not appear that she has been getting serial labs as previously ordered. TSH wnl.       Relevant Medications   methimazole (TAPAZOLE) 10 MG tablet   Other Relevant Orders   TSH (Completed)   Intertriginous dermatitis associated with moisture    Rashes well demarcated with small satellite lesions, most consistent with intertriginous dermatitis.  No signs of superinfection. -Keep areas clean and dry - Use Desitin barrier cream to help with friction.        Address HM at next follow up visit.  Health Maintenance discussed with patient and patient agrees to address when able.  Health Maintenance Due  Topic Date Due  . PAP SMEAR-Modifier  08/11/1982  . MAMMOGRAM  03/01/2019   Melene Plan, M.D.  4:47 PM 07/30/2019

## 2019-07-27 NOTE — Patient Instructions (Addendum)
Dear Yesenia [name],   It was good to see you! Thank you for taking your time to come in to be seen. Today, we discussed the following:   Insomnia  Start Ramelteon   Start a sleep journal   Hyperthyroid   Call you doctor for future refills of methimazole   I will send some today   Rash  Please use Desitin twice daily    Be well,   Genia Hotter, M.D   [facility] [phone]  *Sign up for MyChart for instant access to your health profile, labs, orders, upcoming appointments or to contact your provider with questions*  ===================================================================================

## 2019-07-28 LAB — TSH: TSH: 2.36 u[IU]/mL (ref 0.450–4.500)

## 2019-07-30 ENCOUNTER — Encounter: Payer: Self-pay | Admitting: Family Medicine

## 2019-07-30 ENCOUNTER — Other Ambulatory Visit (HOSPITAL_COMMUNITY)
Admission: RE | Admit: 2019-07-30 | Discharge: 2019-07-30 | Disposition: A | Discharge status: Home / Self Care | Payer: Medicaid Other | Source: Ambulatory Visit | Attending: Emergency Medicine | Admitting: Emergency Medicine

## 2019-07-30 DIAGNOSIS — Z20822 Contact with and (suspected) exposure to covid-19: Secondary | ICD-10-CM | POA: Diagnosis not present

## 2019-07-30 DIAGNOSIS — Z01812 Encounter for preprocedural laboratory examination: Secondary | ICD-10-CM | POA: Diagnosis present

## 2019-07-30 DIAGNOSIS — L304 Erythema intertrigo: Secondary | ICD-10-CM | POA: Insufficient documentation

## 2019-07-30 NOTE — Assessment & Plan Note (Addendum)
Patient seen for sleep study and sleep apnea was ruled out, per patient.  She is currently not taking any medications that would make her stay up.  She does report watching TV to fall asleep.  Poor overall sleep hygiene.  Discussed other medications that may be habit-forming and may not fix the issue.  She is able to sleep, though it does during the day, which makes me think more of a possible circadian disorder and/or may be behavioral component.  Has already tried melatonin without relief.  Patient is not working at this time and so her sleeping issues are not abnormal for her as they do not impair typical daily life. -Stop taking over-the-counter medications as these can be habit-forming -Start ramelteon, follow-up in 1 month with sleep journal and improved compliance with sleep hygiene - Can consider light therapy at that time.

## 2019-07-30 NOTE — Assessment & Plan Note (Signed)
Follows with Dr. Lonzo Cloud. Patient reports she is almost out of her medication. Will refill for one month, but patient is aware that she will need to get all future refills with Dr. Lonzo Cloud. I recommend she make a f/u appt with her as she has not been seen. Did order TSH today as it does not appear that she has been getting serial labs as previously ordered. TSH wnl.

## 2019-07-30 NOTE — Assessment & Plan Note (Signed)
Currently not taking metformin. Her Al1c is 6.5 today, which makes her diabetic but well controlled with diet and without medicaiton. Recheck A1C in 6 months. Patient not currently taking statin.

## 2019-07-30 NOTE — Assessment & Plan Note (Signed)
Rashes well demarcated with small satellite lesions, most consistent with intertriginous dermatitis.  No signs of superinfection. -Keep areas clean and dry - Use Desitin barrier cream to help with friction.

## 2019-07-30 NOTE — Assessment & Plan Note (Signed)
Is no longer taking cymbalta or nortiptyline

## 2019-07-31 LAB — NOVEL CORONAVIRUS, NAA (HOSP ORDER, SEND-OUT TO REF LAB; TAT 18-24 HRS): SARS-CoV-2, NAA: NOT DETECTED

## 2019-08-02 ENCOUNTER — Other Ambulatory Visit: Payer: Self-pay

## 2019-08-02 ENCOUNTER — Ambulatory Visit: Payer: Medicaid Other | Admitting: Adult Health

## 2019-08-02 ENCOUNTER — Encounter: Payer: Self-pay | Admitting: Adult Health

## 2019-08-02 ENCOUNTER — Ambulatory Visit (INDEPENDENT_AMBULATORY_CARE_PROVIDER_SITE_OTHER): Payer: Medicaid Other | Admitting: Emergency Medicine

## 2019-08-02 ENCOUNTER — Ambulatory Visit: Payer: Medicaid Other | Admitting: Emergency Medicine

## 2019-08-02 DIAGNOSIS — R0602 Shortness of breath: Secondary | ICD-10-CM

## 2019-08-02 MED ORDER — BREO ELLIPTA 100-25 MCG/INH IN AEPB: 1.0000 | INHALATION_SPRAY | Freq: Every day | RESPIRATORY_TRACT | 0 refills | Status: DC

## 2019-08-02 NOTE — Patient Instructions (Addendum)
Breo 1 puff daily , brush/rinse and gargle after use.  Albuterol inhaler As needed  Wheezing /shortness of breath - this is your rescue inhaler .  Activity as tolerated.  Claritin 10mg  daily As needed  Drainage.  Work on healthy weight loss.  Follow up with Dr.  In 6 weeks and As needed   Please contact office for sooner follow up if symptoms do not improve or worsen or seek emergency care

## 2019-08-02 NOTE — Progress Notes (Signed)
@Patient  ID: , female    DOB: 1961-02-19, 59 y.o.   MRN: [id number]    Referring provider: [id number], MD  HPI: 59 yo never smoker with multiple medical issues including Hypertension, Chronic kidney disease, PUD and OSA. She had prior tracheostomy 2017-2018 (followed by Dr. 08-10-2003) . Repeat Sleep study did not show OSA . Seen initially in 01/2019 for progressive dyspnea, worse with     TEST/EVENTS :  Underwent spirometry 12/19/2018  shows principally restriction.  split-night sleep study 01/06/2018 that showed an AHI of 2.6/h, CPAP was not initiated.  CXR 01/2019 Clear lungs.   08/02/19 Follow up : Dyspnea , PFT results  Patient presents for a follow up visit . Has been experiencing progressive dyspnea on /off over last year. Can hear herself wheeze at times, worse at night.  Has nasal congestion with clear drainage. Says she is not active, sedentary . Has chronic joint issues. Had PFTs today shows moderate restriction , FEV1 78%, ratio 95, FVC 65%, no BD response . DLCO 59%. Some mild mid flow reversibiltiy . Flow volume loop are normal . Denies  dysphagia, cough , n/v/d. Weight slowly trending up .    Allergies  Allergen Reactions  . Buprenorphine Hcl Hives  . Morphine And Related Hives and Dermatitis    Immunization History  Administered Date(s) Administered  . Influenza,inj,Quad PF,6+ Mos 04/12/2013, 08/17/2017  . PPD Test 02/28/2016, 08/29/2017  . Pneumococcal Polysaccharide-23 04/12/2013  . Tdap 12/07/2018    Past Medical History:  Diagnosis Date  . Abdominal pain, epigastric   . Abscess   . Acute constipation 02/16/2016  . Acute stomach ulcer 08/15/2017  . AKI (acute kidney injury) (HCC)    Acute kidney injury on CKD stage II-III/notes 08/17/2017  . Anemia   . Anemia   . Arthritis    "knees" (08/17/2017)  . Bleeding 06/23/2016  . Blood transfusion without reported diagnosis 2012   anemia  . Cerebral infarction (HCC) 01/14/2013  . Chest pain 02/15/2013   . Chronic pain   . CKD (chronic kidney disease)    Stage 3  . Depression   . Dysuria 12/06/2018  . Fibromyalgia    hospitilized 12/16 due to inability to walk  . Gastric ulcer   . Graves disease   . History of gastritis 2019  . History of tracheostomy 05/20/2016  . Hyperlipemia   . Hypertension   . Insomnia   . Iron deficiency anemia 05/26/2016  . Irritable bowel syndrome 05/21/2016  . Neuropathy   . Obstructive sleep apnea of adult 10/03/2015  . Right sided weakness   . Sleep apnea    "CPAPs didn't work for me; that's why they did OR" (08/17/2017)  . Stroke [facility]) 2014?   no residual weakness   . 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 Tremor 10/25/2017  . Type II diabetes mellitus (HCC)     does not check CBG's (08/17/2017), no meds  . Vitamin deficiency    Vit D    Tobacco History: Social History   Tobacco Use  Smoking Status Never Smoker  Smokeless Tobacco Never Used   Counseling given: Not Answered   Outpatient Medications Prior to Visit  Medication Sig Dispense Refill  . albuterol (VENTOLIN HFA) 108 (90 Base) MCG/ACT inhaler Inhale 2 puffs into the lungs every 6 (six) hours as needed for wheezing or shortness of breath. 18 g 5  . amLODipine (NORVASC) 5 MG tablet Take 1 tablet (5 mg total) by mouth daily. 90  tablet 3  . atenolol (TENORMIN) 100 MG tablet TAKE ONE TABLET BY MOUTH EVERY DAY FOR HIGH BLOOD PRESSURE 90 tablet 3  . Diaper Rash Products (DESITIN MULTI-PURPOSE HEALING) OINT Apply 1 application topically 2 (two) times daily. 397 g 0  . gabapentin (NEURONTIN) 300 MG capsule Take 3 capsules (900 mg total) by mouth at bedtime. 90 capsule 2  . hydrOXYzine (ATARAX/VISTARIL) 50 MG tablet TAKE ONE TABLET BY MOUTH AT BEDTIME FOR ANXIETY WITH 25MG  HYDROXYZINE TO MAKE 75 MG 90 tablet 2  . methimazole (TAPAZOLE) 10 MG tablet Take 1.5 tablets (15 mg total) by mouth daily. 45 tablet 0  . ondansetron (ZOFRAN-ODT) 8 MG disintegrating tablet Take 1 tablet (8 mg total) by mouth  every 8 (eight) hours as needed for nausea or vomiting. 90 tablet 1  . pantoprazole (PROTONIX) 40 MG tablet Take 1 tablet (40 mg total) by mouth 2 (two) times daily. 60 tablet 0  . ramelteon (ROZEREM) 8 MG tablet Take 1 tablet (8 mg total) by mouth at bedtime as needed for sleep. 30 tablet 1  . traZODone (DESYREL) 50 MG tablet Take 1 tablet (50 mg total) by mouth at bedtime. 60 tablet 0   No facility-administered medications prior to visit.     Review of Systems:   Constitutional:   No  weight loss, night sweats,  Fevers, chills,  +fatigue, or  lassitude.  HEENT:   No headaches,  Difficulty swallowing,  Tooth/dental problems, or  Sore throat,                No sneezing, itching, ear ache, nasal congestion, post nasal drip,   CV:  No chest pain,  Orthopnea, PND, swelling in lower extremities, anasarca, dizziness, palpitations, syncope.   GI  No heartburn, indigestion, abdominal pain, nausea, vomiting, diarrhea, change in bowel habits, loss of appetite, bloody stools.   Resp: .  No chest wall deformity  Skin: no rash or lesions.  GU: no dysuria, change in color of urine, no urgency or frequency.  No flank pain, no hematuria   MS:  No joint pain or swelling.  No decreased range of motion.  No back pain.    Physical Exam  BP 124/66 (BP Location: Left Wrist, Cuff Size: Normal)   Pulse 66   Temp 98.7 F (37.1 C) (Temporal)   Ht 5\' 7"  (1.702 m)   Wt (!) 330 lb (149.7 kg)   SpO2 98% Comment: RA  BMI 51.69 kg/m   GEN: A/Ox3; pleasant , NAD, BMI 51    HEENT:  /AT,   NOSE-clear, THROAT-clear, no lesions, no postnasal drip or exudate noted.   NECK:  Supple w/ fair ROM; no JVD; normal carotid impulses w/o bruits; no thyromegaly or nodules palpated; no lymphadenopathy.  Previous trach stoma, healed.  No stridor noted.   RESP  Clear  P & A; w/o, wheezes/ rales/ or rhonchi. no accessory muscle use, no dullness to percussion  CARD:  RRR, no m/r/g, tr  peripheral edema, pulses  intact, no cyanosis or clubbing.  GI:   Soft & nt; nml bowel sounds; no organomegaly or masses detected.   Musco: Warm bil, no deformities or joint swelling noted.   Neuro: alert, no focal deficits noted.    Skin: Warm, no lesions or rashes    Lab Results:  CBC  BNP   ProBNP No results found for: PROBNP  Imaging: No results found.    PFT Results Latest Ref Rng & Units 08/02/2019  FVC-Pre L 2.26  FVC-Predicted Pre % 73  FVC-Post L 2.01  FVC-Predicted Post % 65  Pre FEV1/FVC % % 82  Post FEV1/FCV % % 95  FEV1-Pre L 1.85  FEV1-Predicted Pre % 75  FEV1-Post L 1.91  DLCO UNC% % 59  DLCO COR %Predicted % 99  TLC L 2.80  TLC % Predicted % 50  RV % Predicted % 27    No results found for: NITRICOXIDE      Assessment & Plan:   No problem-specific Assessment & Plan notes found for this encounter.     Rubye Oaks, NP 08/02/2019

## 2019-08-02 NOTE — Progress Notes (Signed)
PFT done today. 

## 2019-08-05 NOTE — Assessment & Plan Note (Signed)
Healthy weight loss 

## 2019-08-05 NOTE — Assessment & Plan Note (Signed)
Dyspnea suspect is multifactoral with deconditioning , restrictive lung disease -morbid obesity. PFT shows no airflow obstruction, mainly restriction.  Has some mid flow reversibility - may have component reactive airways. No evidence on PFT of upper airway obstruction, flow volume loops normal .  Would recommend healthy weight loss, activity as tolerated.  Trial of BREO for possible RAD/intermittent asthma   Plan  Patient Instructions  Breo 1 puff daily , brush/rinse and gargle after use.  Albuterol inhaler As needed  Wheezing /shortness of breath - this is your rescue inhaler .  Activity as tolerated.  Claritin 10mg  daily As needed  Drainage.  Work on healthy weight loss.  Follow up with Dr.  In 6 weeks and As needed   Please contact office for sooner follow up if symptoms do not improve or worsen or seek emergency care

## 2019-08-08 ENCOUNTER — Telehealth: Payer: Self-pay | Admitting: Adult Health

## 2019-08-08 MED ORDER — BREO ELLIPTA 100-25 MCG/INH IN AEPB: 1.0000 | INHALATION_SPRAY | Freq: Every day | RESPIRATORY_TRACT | 5 refills | Status: AC

## 2019-08-08 NOTE — Telephone Encounter (Signed)
Spoke with pt. At her last OV, TP gave her samples of Breo. Pt would like a prescription to be sent to her pharmacy. This has been taken care of. Nothing further was needed.

## 2019-08-10 ENCOUNTER — Telehealth: Payer: Self-pay | Admitting: Adult Health

## 2019-08-10 ENCOUNTER — Telehealth: Payer: Self-pay

## 2019-08-10 NOTE — Telephone Encounter (Signed)
PA request received from [facility]  Drug requested: Breo 100 CMM Key: [id number] Tried/failed: Qvar 80, Flovent Covered alternatives:  PA request has been sent to plan, and a determination is expected within 1-3 days.   Routing to [place] for follow-up.  PA #Charlann Noss [id number] Reference #: [id number]  Was asked to please call back on Monday for an update.

## 2019-08-10 NOTE — Telephone Encounter (Signed)
Patient had visit with TP 1.21.21:  Dyspnea - Parrett, Virgel Bouquet, NP at 08/05/2019 11:14 PM Status: Written  Related Problem: Dyspnea    Dyspnea suspect is multifactoral with deconditioning , restrictive lung disease -morbid obesity. PFT shows no airflow obstruction, mainly restriction.  Has some mid flow reversibility - may have component reactive airways. No evidence on PFT of upper airway obstruction, flow volume loops normal .  Would recommend healthy weight loss, activity as tolerated.  Trial of BREO for possible RAD/intermittent asthma    Plan  Patient Instructions  Breo 1 puff daily , brush/rinse and gargle after use.  Albuterol inhaler As needed  Wheezing /shortness of breath - this is your rescue inhaler .  Activity as tolerated.  Claritin 10mg  daily As needed  Drainage.  Work on healthy weight loss.  Follow up with Dr.  In 6 weeks and As needed   Please contact office for sooner follow up if symptoms do not improve or worsen or seek emergency care        Received fax from [facility] pharmacy stating that PA is required on the [facility].  Tammy, would you like to proceed with PA or change to covered alternative:

## 2019-08-10 NOTE — Telephone Encounter (Signed)
That is fine to do PA . I can not read the alternatives page

## 2019-08-13 ENCOUNTER — Other Ambulatory Visit: Payer: Self-pay | Admitting: Family Medicine

## 2019-08-13 NOTE — Telephone Encounter (Signed)
See her 1.29.21 phone note

## 2019-08-13 NOTE — Telephone Encounter (Signed)
Called [employer] to initiate PA for Breo.  PA is being reviewed, and an expected turnaround time = 24 hours.  We will need to call back tomorrow to check status.   [employer] #: [phone] PA: [phone]  Will call back 08/14/19 to check status of PA.

## 2019-08-14 ENCOUNTER — Other Ambulatory Visit: Payer: Self-pay | Admitting: Family Medicine

## 2019-08-17 NOTE — Telephone Encounter (Signed)
Medication name and strength: Breo Ellipta 100 PA approved/denied: Approved Approval dates: 08/13/2019-08/07/2020  Pharmacy called and notified that PA was approved

## 2019-08-21 ENCOUNTER — Other Ambulatory Visit: Payer: Self-pay

## 2019-08-21 ENCOUNTER — Encounter (HOSPITAL_COMMUNITY): Payer: Self-pay | Admitting: Emergency Medicine

## 2019-08-21 ENCOUNTER — Emergency Department (HOSPITAL_COMMUNITY)
Admission: EM | Admit: 2019-08-21 | Discharge: 2019-08-21 | Disposition: A | Discharge status: Home / Self Care | Payer: Medicaid Other | Attending: Emergency Medicine | Admitting: Emergency Medicine

## 2019-08-21 ENCOUNTER — Emergency Department (HOSPITAL_COMMUNITY): Payer: Medicaid Other

## 2019-08-21 DIAGNOSIS — R202 Paresthesia of skin: Secondary | ICD-10-CM | POA: Diagnosis present

## 2019-08-21 DIAGNOSIS — N390 Urinary tract infection, site not specified: Secondary | ICD-10-CM

## 2019-08-21 DIAGNOSIS — E1122 Type 2 diabetes mellitus with diabetic chronic kidney disease: Secondary | ICD-10-CM | POA: Diagnosis not present

## 2019-08-21 DIAGNOSIS — I129 Hypertensive chronic kidney disease with stage 1 through stage 4 chronic kidney disease, or unspecified chronic kidney disease: Secondary | ICD-10-CM | POA: Insufficient documentation

## 2019-08-21 DIAGNOSIS — N183 Chronic kidney disease, stage 3 unspecified: Secondary | ICD-10-CM | POA: Diagnosis not present

## 2019-08-21 DIAGNOSIS — Z79899 Other long term (current) drug therapy: Secondary | ICD-10-CM | POA: Diagnosis not present

## 2019-08-21 LAB — BASIC METABOLIC PANEL
Anion gap: 11 (ref 5–15)
BUN: 18 mg/dL (ref 6–20)
CO2: 26 mmol/L (ref 22–32)
Calcium: 8.7 mg/dL — ABNORMAL LOW (ref 8.9–10.3)
Chloride: 107 mmol/L (ref 98–111)
Creatinine, Ser: 1.19 mg/dL — ABNORMAL HIGH (ref 0.44–1.00)
GFR calc Af Amer: 58 mL/min — ABNORMAL LOW (ref >60)
GFR calc non Af Amer: 50 mL/min — ABNORMAL LOW (ref >60)
Glucose, Bld: 151 mg/dL — ABNORMAL HIGH (ref 70–99)
Potassium: 4.3 mmol/L (ref 3.5–5.1)
Sodium: 144 mmol/L (ref 135–145)

## 2019-08-21 LAB — URINALYSIS, ROUTINE W REFLEX MICROSCOPIC
Bilirubin Urine: NEGATIVE
Glucose, UA: NEGATIVE mg/dL
Ketones, ur: NEGATIVE mg/dL
Nitrite: POSITIVE — AB
Protein, ur: 100 mg/dL — AB
Specific Gravity, Urine: 1.024 (ref 1.005–1.030)
WBC, UA: >50 WBC/hpf — ABNORMAL HIGH (ref 0–5)
pH: 5 (ref 5.0–8.0)

## 2019-08-21 LAB — CBC
HCT: 37.1 % (ref 36.0–46.0)
Hemoglobin: 11.3 g/dL — ABNORMAL LOW (ref 12.0–15.0)
MCH: 27.6 pg (ref 26.0–34.0)
MCHC: 30.5 g/dL (ref 30.0–36.0)
MCV: 90.7 fL (ref 80.0–100.0)
Platelets: 360 10*3/uL (ref 150–400)
RBC: 4.09 MIL/uL (ref 3.87–5.11)
RDW: 15.1 % (ref 11.5–15.5)
WBC: 8.4 10*3/uL (ref 4.0–10.5)
nRBC: 0 % (ref 0.0–0.2)

## 2019-08-21 MED ORDER — CEPHALEXIN 500 MG PO CAPS: 1000.0000 mg | ORAL_CAPSULE | Freq: Two times a day (BID) | ORAL | 0 refills | Status: DC

## 2019-08-21 MED ORDER — GABAPENTIN 300 MG PO CAPS
300.0000 mg | ORAL_CAPSULE | Freq: Once | ORAL | Status: AC
  Administered 2019-08-21: 23:00:00 300 mg via ORAL
  Filled 2019-08-21: qty 1

## 2019-08-21 MED ORDER — LORAZEPAM 2 MG/ML IJ SOLN
1.0000 mg | Freq: Once | INTRAMUSCULAR | Status: AC
  Administered 2019-08-21: 1 mg via INTRAVENOUS
  Filled 2019-08-21: qty 1

## 2019-08-21 MED ORDER — SODIUM CHLORIDE 0.9% FLUSH
3.0000 mL | Freq: Once | INTRAVENOUS | Status: AC
  Administered 2019-08-21: 23:00:00 3 mL via INTRAVENOUS

## 2019-08-21 MED ORDER — SODIUM CHLORIDE 0.9 % IV SOLN
1.0000 g | Freq: Once | INTRAVENOUS | Status: AC
  Administered 2019-08-21: 23:00:00 1 g via INTRAVENOUS
  Filled 2019-08-21: qty 10

## 2019-08-21 NOTE — ED Notes (Signed)
Patient verbalizes understanding of discharge instructions. Opportunity for questioning and answers were provided. Armband removed by staff, pt discharged from ED to home via POV  

## 2019-08-21 NOTE — ED Provider Notes (Signed)
[facility] EMERGENCY DEPARTMENT Provider Note   CSN: [id number] Arrival date & time: 08/21/19  1711     History Chief Complaint  Patient presents with  . Numbness  . Nausea    Yesenia [name] is a 59 y.o. female.  Patient indicates today, while having nails done, acute onset left leg numbness/tingling. Symptoms acute onset, moderate, persistent, without specific exacerbating or alleviating factor. No weakness, has been able to walk. Denies neck or back pain, no radicular pain. No leg swelling. Indicates last pm transiently had slurred speech, lasted several minutes. Today speech normal. No change in vision. No associated numbness/weakness, or loss of normal functional ability. Patient also c/o nausea for past 3-4 days. No vomiting. Did have loose bm today x 1. No abd distension. Also notes urinary urgency, and crampy sensation in suprapubic area. No flank pain. No fever or chills.   The history is provided by the patient.       Past Medical History:  Diagnosis Date  . Abdominal pain, epigastric   . Abscess   . Acute constipation 02/16/2016  . Acute stomach ulcer 08/15/2017  . AKI (acute kidney injury) (HCC)    Acute kidney injury on CKD stage II-III/notes 08/17/2017  . Anemia   . Anemia   . Arthritis    "knees" (08/17/2017)  . Bleeding 06/23/2016  . Blood transfusion without reported diagnosis 2012   anemia  . Cerebral infarction (HCC) 01/14/2013  . Chest pain 02/15/2013  . Chronic pain   . CKD (chronic kidney disease)    Stage 3  . Depression   . Dysuria 12/06/2018  . Fibromyalgia    hospitilized 12/16 due to inability to walk  . Gastric ulcer   . Graves disease   . History of gastritis 2019  . History of tracheostomy 05/20/2016  . Hyperlipemia   . Hypertension   . Insomnia   . Iron deficiency anemia 05/26/2016  . Irritable bowel syndrome 05/21/2016  . Neuropathy   . Obstructive sleep apnea of adult 10/03/2015  . Right sided weakness   . Sleep apnea      "CPAPs didn't work for me; that's why they did OR" (08/17/2017)  . Stroke [facility]) 2014?   no residual weakness   . 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 Tremor 10/25/2017  . Type II diabetes mellitus (HCC)     does not check CBG's (08/17/2017), no meds  . Vitamin deficiency    Vit D    Patient Active Problem List   Diagnosis Date Noted  . Intertriginous dermatitis associated with moisture 07/30/2019  . Right upper quadrant pain 03/09/2019  . Graves disease 12/13/2018  . Elevated alkaline phosphatase level 12/13/2018  . Morbid obesity (HCC) 12/13/2018  . Bilateral leg numbness 12/08/2018  . Healthcare maintenance 12/08/2018  . Anxiety 12/08/2018  . Chronic obstructive lung disease (HCC) 12/06/2018  . Hyperlipidemia 12/06/2018  . Neuropathy 12/06/2018  . Backache 12/06/2018  . Lower abdominal pain 12/06/2018  . Pain in lower limb 12/06/2018  . Osteoarthritis of both knees 05/25/2018  . Pain 05/07/2018  . Chronic pain of both knees   . Hyperthyroidism 04/20/2018  . Hepatic steatosis 08/24/2017  . Vitamin D deficiency 08/23/2017  . Hyperlipidemia associated with type 2 diabetes mellitus (HCC) 08/23/2017  . Epigastric pain   . MDD (major depressive disorder), recurrent severe, without psychosis (HCC)   . Primary fibromyalgia syndrome 02/22/2016  . CKD (chronic kidney disease), stage III (HCC) 02/16/2016  . Morbid obesity with BMI  of 50.0-59.9, adult (HCC) 01/19/2016  . Chronic pain 10/15/2015  . Circadian dysregulation 08/10/2015  . Polyneuropathy 07/10/2015  . GERD (gastroesophageal reflux disease) 07/10/2015  . Abnormal gait 07/07/2015  . Morbid (severe) obesity due to excess calories (HCC) complicated by OSA 07/07/2015  . Injury of kidney 07/07/2015  . Acute pyelonephritis 04/11/2013  . Type 2 diabetes mellitus with peripheral neuropathy (HCC) 04/11/2013  . Tachycardia 02/15/2013  . Dyspnea 02/15/2013  . Cerebrovascular accident (HCC) 01/14/2013  . Hypertension  associated with diabetes (HCC) 01/14/2013  . Anemia 01/14/2013    Past Surgical History:  Procedure Laterality Date  . ANTERIOR INTEROSSEOUS NERVE DECOMPRESSION Right 06/16/2018   Procedure: RIGHT ULNAR NEUROPLATY AT THE ELBOW AND WRIST;  Surgeon: Mack Hook, MD;  Location: [facility] OR;  Service: Orthopedics;  Laterality: Right;  . ESOPHAGOGASTRODUODENOSCOPY (EGD) WITH PROPOFOL N/A 08/16/2017   Procedure: ESOPHAGOGASTRODUODENOSCOPY (EGD) WITH PROPOFOL;  Surgeon: Benancio Deeds, MD;  Location: [facility] ENDOSCOPY;  Service: Gastroenterology;  Laterality: N/A;  . KNEE ARTHROPLASTY  1997   "stuck rods in it"  . KNEE ARTHROSCOPY Right 1992, 1995,  . OOPHORECTOMY Right 1993?  . TRACHEOSTOMY TUBE PLACEMENT N/A 02/13/2016   Procedure: TRACHEOSTOMY;  Surgeon: Christia Reading, MD;  Location: [facility] OR;  Service: ENT;  Laterality: N/A;  . TUBAL LIGATION       OB History   No obstetric history on file.     Family History  Problem Relation Age of Onset  . Breast cancer Mother   . Leukemia Brother   . Breast cancer Maternal Grandmother   . Colon cancer Neg Hx   . Esophageal cancer Neg Hx   . Rectal cancer Neg Hx   . Stomach cancer Neg Hx     Social History   Tobacco Use  . Smoking status: Never Smoker  . Smokeless tobacco: Never Used  Substance Use Topics  . Alcohol use: No    Alcohol/week: 0.0 standard drinks  . Drug use: No    Home Medications Prior to Admission medications   Medication Sig Start Date End Date Taking? Authorizing Provider  albuterol (VENTOLIN HFA) 108 (90 Base) MCG/ACT inhaler Inhale 2 puffs into the lungs every 6 (six) hours as needed for wheezing or shortness of breath. 06/12/19   Melene Plan, MD  amLODipine (NORVASC) 5 MG tablet Take 1 tablet (5 mg total) by mouth daily. 02/15/19   Melene Plan, MD  atenolol (TENORMIN) 100 MG tablet TAKE ONE TABLET BY MOUTH EVERY DAY FOR HIGH BLOOD PRESSURE 06/12/19   Melene Plan, MD  Diaper Rash Products (DESITIN MULTI-PURPOSE  HEALING) OINT Apply 1 application topically 2 (two) times daily. 07/27/19   Melene Plan, MD  fluticasone furoate-vilanterol (BREO ELLIPTA) 100-25 MCG/INH AEPB Inhale 1 puff into the lungs daily. 08/08/19   Parrett, Virgel Bouquet, NP  gabapentin (NEURONTIN) 300 MG capsule Take 3 capsules (900 mg total) by mouth at bedtime. 08/13/19   Melene Plan, MD  hydrOXYzine (ATARAX/VISTARIL) 50 MG tablet TAKE ONE TABLET BY MOUTH AT BEDTIME FOR ANXIETY WITH 25MG  HYDROXYZINE TO MAKE 75 MG 06/12/19   14/1/20, MD  methimazole (TAPAZOLE) 10 MG tablet Take 1.5 tablets (15 mg total) by mouth daily. 07/27/19   07/29/19, MD  ondansetron (ZOFRAN-ODT) 8 MG disintegrating tablet Take 1 tablet (8 mg total) by mouth every 8 (eight) hours as needed for nausea or vomiting. 11/27/18   Armbruster, 11/29/18, MD  pantoprazole (PROTONIX) 40 MG tablet Take 1 tablet (40  mg total) by mouth 2 (two) times daily. 06/14/19   Wilber Oliphant, MD  ramelteon (ROZEREM) 8 MG tablet Take 1 tablet (8 mg total) by mouth at bedtime as needed for sleep. 07/27/19 07/26/20  Wilber Oliphant, MD  traZODone (DESYREL) 50 MG tablet Take 1 tablet (50 mg total) by mouth at bedtime. 08/17/19   Wilber Oliphant, MD    Allergies    Buprenorphine hcl and Morphine and related  Review of Systems   Review of Systems  Constitutional: Negative for fever.  HENT: Negative for sore throat.   Eyes: Negative for visual disturbance.  Respiratory: Negative for cough and shortness of breath.   Cardiovascular: Negative for chest pain.  Gastrointestinal: Positive for nausea. Negative for abdominal pain and vomiting.  Genitourinary: Positive for urgency. Negative for flank pain.  Musculoskeletal: Negative for back pain and neck pain.  Skin: Negative for rash.  Neurological: Positive for numbness. Negative for weakness and headaches.  Hematological: Does not bruise/bleed easily.  Psychiatric/Behavioral: Negative for confusion.    Physical Exam Updated Vital Signs BP 99/69    Pulse 61   Temp 98 F (36.7 C) (Oral)   Resp 16   SpO2 98%   Physical Exam Vitals and nursing note reviewed.  Constitutional:      Appearance: Normal appearance. She is well-developed.  HENT:     Head: Atraumatic.     Nose: Nose normal.     Mouth/Throat:     Mouth: Mucous membranes are moist.  Eyes:     General: No scleral icterus.    Conjunctiva/sclera: Conjunctivae normal.     Pupils: Pupils are equal, round, and reactive to light.  Neck:     Vascular: No carotid bruit.     Trachea: No tracheal deviation.  Cardiovascular:     Rate and Rhythm: Normal rate and regular rhythm.     Pulses: Normal pulses.     Heart sounds: Normal heart sounds. No murmur. No friction rub. No gallop.   Pulmonary:     Effort: Pulmonary effort is normal. No respiratory distress.     Breath sounds: Normal breath sounds.  Abdominal:     General: Bowel sounds are normal. There is no distension.     Palpations: Abdomen is soft.     Tenderness: There is no abdominal tenderness. There is no guarding.  Genitourinary:    Comments: No cva tenderness.  Musculoskeletal:        General: No swelling or tenderness.     Cervical back: Normal range of motion and neck supple. No rigidity. No muscular tenderness.  Skin:    General: Skin is warm and dry.     Findings: No rash.  Neurological:     Mental Status: She is alert.     Comments: Alert, speech normal/fluent. Motor intact bil, stre 5/5. No pronator drift. sens grossly intact bil.   Psychiatric:     Comments: Mildly anxious.      ED Results / Procedures / Treatments   Labs (all labs ordered are listed, but only abnormal results are displayed) Results for orders placed or performed during the hospital encounter of 75/10/25  Basic metabolic panel  Result Value Ref Range   Sodium 144 135 - 145 mmol/L   Potassium 4.3 3.5 - 5.1 mmol/L   Chloride 107 98 - 111 mmol/L   CO2 26 22 - 32 mmol/L   Glucose, Bld 151 (H) 70 - 99 mg/dL   BUN 18 6 - 20 mg/dL  Creatinine, Ser 1.19 (H) 0.44 - 1.00 mg/dL   Calcium 8.7 (L) 8.9 - 10.3 mg/dL   GFR calc non Af Amer 50 (L) >60 mL/min   GFR calc Af Amer 58 (L) >60 mL/min   Anion gap 11 5 - 15  CBC  Result Value Ref Range   WBC 8.4 4.0 - 10.5 K/uL   RBC 4.09 3.87 - 5.11 MIL/uL   Hemoglobin 11.3 (L) 12.0 - 15.0 g/dL   HCT 56.3 87.5 - 64.3 %   MCV 90.7 80.0 - 100.0 fL   MCH 27.6 26.0 - 34.0 pg   MCHC 30.5 30.0 - 36.0 g/dL   RDW 32.9 51.8 - 84.1 %   Platelets 360 150 - 400 K/uL   nRBC 0.0 0.0 - 0.2 %  Urinalysis, Routine w reflex microscopic  Result Value Ref Range   Color, Urine AMBER (A) YELLOW   APPearance CLOUDY (A) CLEAR   Specific Gravity, Urine 1.024 1.005 - 1.030   pH 5.0 5.0 - 8.0   Glucose, UA NEGATIVE NEGATIVE mg/dL   Hgb urine dipstick SMALL (A) NEGATIVE   Bilirubin Urine NEGATIVE NEGATIVE   Ketones, ur NEGATIVE NEGATIVE mg/dL   Protein, ur 660 (A) NEGATIVE mg/dL   Nitrite POSITIVE (A) NEGATIVE   Leukocytes,Ua MODERATE (A) NEGATIVE   RBC / HPF 11-20 0 - 5 RBC/hpf   WBC, UA >50 (H) 0 - 5 WBC/hpf   Bacteria, UA MANY (A) NONE SEEN   Squamous Epithelial / LPF 6-10 0 - 5   WBC Clumps PRESENT    Mucus PRESENT     EKG EKG Interpretation  Date/Time:  Tuesday August 21 2019 17:19:51 EST Ventricular Rate:  66 PR Interval:  158 QRS Duration: 90 QT Interval:  466 QTC Calculation: 488 R Axis:   18 Text Interpretation: Normal sinus rhythm Nonspecific T wave abnormality Confirmed by Cathren Laine ([id number]) on 08/21/2019 9:36:47 PM   Radiology CT HEAD WO CONTRAST  Result Date: 08/21/2019 CLINICAL DATA:  Left leg numbness that began earlier today EXAM: CT HEAD WITHOUT CONTRAST TECHNIQUE: Contiguous axial images were obtained from the base of the skull through the vertex without intravenous contrast. COMPARISON:  09/08/2018, 08/08/2018 FINDINGS: Brain: Hypodensities throughout the periventricular and subcortical white matter are unchanged, consistent with chronic small vessel ischemic  changes. No acute infarct or hemorrhage. Lateral ventricles and midline structures are stable. No acute extra-axial fluid collections. No mass effect. Vascular: No hyperdense vessel or unexpected calcification. Skull: Normal. Negative for fracture or focal lesion. Sinuses/Orbits: No acute finding. Other: None IMPRESSION: 1. Chronic ischemic changes, no acute intracranial process. Electronically Signed   By: Sharlet Salina M.D.   On: 08/21/2019 22:36    Procedures Procedures (including critical care time)  Medications Ordered in ED Medications  sodium chloride flush (NS) 0.9 % injection 3 mL (has no administration in time range)  LORazepam (ATIVAN) injection 1 mg (has no administration in time range)    ED Course  I have reviewed the triage vital signs and the nursing notes.  Pertinent labs & imaging results that were available during my care of the patient were reviewed by me and considered in my medical decision making (see chart for details).    MDM Rules/Calculators/A&P                      Labs and imaging ordered.   Reviewed nursing notes and prior charts for additional history.   Patient request medication for nerves/anxiety. Ativan 1 mg  iv.   Labs reviewed/interpreted by me -  Chem normal, hct normal, wbc normal. ua c/w uti - will cx and tx.  Rocephin iv.   CT reviewed/interpreted by me - no acute hem, no large/acute cva.   Recheck pt, no current numbness or weakness. Feels improved. Vitals normal. Talking on phone, no distress.  Patient currently appears stable for d/c. rx for home.   rec pcp and neurology f/u.  Return precautions provided.       Final Clinical Impression(s) / ED Diagnoses Final diagnoses:  None    Rx / DC Orders ED Discharge Orders    None       Cathren Laine, MD 08/21/19 2340

## 2019-08-21 NOTE — Discharge Instructions (Addendum)
It was our pleasure to provide your ER care today - we hope that you feel better.  Rest. Drink plenty of fluids. Take antibiotic as prescribed for urine infection.   For recent neurologic symptoms, follow up with neurologist in the coming week - call office in AM tomorrow to arrange appointment.   Return to ER if worse, new symptoms, fevers, new or severe pain, persistent vomiting, weak/fainting, one-sided numbness or weakness, change in speech or vision, or other concern.

## 2019-08-21 NOTE — ED Notes (Addendum)
Pt states that she is very claustrophobic and will need medication for CT

## 2019-08-21 NOTE — ED Triage Notes (Signed)
Pt arrives to ED from home with complaints of sudden left leg numbness while getting her nails done today. Patient stated that this started at 450pm today but the numbness has been improving since. Patient states that she has been nauseous since Sunday and yesterday her friends noticed her slurring her words. Mo neuro deficients at this time.

## 2019-08-22 ENCOUNTER — Telehealth: Payer: Self-pay

## 2019-08-22 NOTE — Telephone Encounter (Signed)
Patient calls nurse line requesting rx for blood pressure machine. Patient states that she was seen in ED yesterday and BP was lower than normal (99/69). Wants to see if rx can be sent in for medicaid to cover cost.   To PCP  Veronda Prude, RN

## 2019-08-23 MED ORDER — BLOOD PRESSURE MON/AUTO/WRIST DEVI: 1.0000 [IU] | Freq: Every day | 0 refills | Status: DC

## 2019-08-23 NOTE — Telephone Encounter (Signed)
Order has been placed. Thank you 

## 2019-08-24 ENCOUNTER — Ambulatory Visit: Payer: Medicaid Other | Admitting: Internal Medicine

## 2019-08-24 ENCOUNTER — Other Ambulatory Visit: Payer: Self-pay | Admitting: Family Medicine

## 2019-08-24 LAB — URINE CULTURE: Culture: >=100000 — AB

## 2019-08-25 MED ORDER — NITROFURANTOIN MONOHYD MACRO 100 MG PO CAPS: 100.0000 mg | ORAL_CAPSULE | Freq: Two times a day (BID) | ORAL | 0 refills | Status: DC

## 2019-08-25 NOTE — Addendum Note (Signed)
Addended by: Genia Hotter E on: 08/25/2019 01:44 PM   Modules accepted: Orders

## 2019-08-25 NOTE — Progress Notes (Signed)
Patient treated for UTI with ctx and PO keflex at hospital ED visit on 08/21/19. I am unsure how this test was linked to this encounter; nonetheless, patient's UTI resistant to keflex, thus will send in macrobid for treatment. Please call patient and let her know to pick up this medication and to stop taking the keflex for UTI.

## 2019-08-27 ENCOUNTER — Telehealth: Payer: Self-pay

## 2019-08-27 MED ORDER — NITROFURANTOIN MONOHYD MACRO 100 MG PO CAPS: 100.0000 mg | ORAL_CAPSULE | Freq: Two times a day (BID) | ORAL | 0 refills | Status: AC

## 2019-08-27 NOTE — Telephone Encounter (Signed)
Rn team received a notification that patients recent Macrobid prescription failed transmission to pharmacy. I called patient to see if she was aware of change, due to original medication not sufficient for bacteria. Patient informed and Macrobid resent to pharmacy.

## 2019-09-10 ENCOUNTER — Telehealth: Payer: Self-pay

## 2019-09-10 DIAGNOSIS — E0590 Thyrotoxicosis, unspecified without thyrotoxic crisis or storm: Secondary | ICD-10-CM

## 2019-09-10 NOTE — Telephone Encounter (Signed)
Referral placed to [facility]

## 2019-09-10 NOTE — Telephone Encounter (Signed)
Patient calls nurse line requesting referral for endocrinologist. Patient states that she cancelled too many times at previous endocrinologist and is no longer able to be seen at office. Patient was being seen at [facility]  To PCP  Please advise  Veronda Prude, RN

## 2019-09-11 ENCOUNTER — Encounter: Payer: Self-pay | Admitting: Family Medicine

## 2019-09-11 ENCOUNTER — Ambulatory Visit (INDEPENDENT_AMBULATORY_CARE_PROVIDER_SITE_OTHER): Payer: Medicaid Other | Admitting: Family Medicine

## 2019-09-11 ENCOUNTER — Other Ambulatory Visit: Payer: Self-pay

## 2019-09-11 VITALS — BP 128/72 | HR 81 | Wt 335.0 lb

## 2019-09-11 DIAGNOSIS — N183 Chronic kidney disease, stage 3 unspecified: Secondary | ICD-10-CM

## 2019-09-11 DIAGNOSIS — K219 Gastro-esophageal reflux disease without esophagitis: Secondary | ICD-10-CM | POA: Diagnosis present

## 2019-09-11 DIAGNOSIS — E1142 Type 2 diabetes mellitus with diabetic polyneuropathy: Secondary | ICD-10-CM | POA: Diagnosis not present

## 2019-09-11 DIAGNOSIS — R42 Dizziness and giddiness: Secondary | ICD-10-CM | POA: Diagnosis not present

## 2019-09-11 DIAGNOSIS — M545 Low back pain, unspecified: Secondary | ICD-10-CM

## 2019-09-11 DIAGNOSIS — G479 Sleep disorder, unspecified: Secondary | ICD-10-CM

## 2019-09-11 DIAGNOSIS — T148XXA Other injury of unspecified body region, initial encounter: Secondary | ICD-10-CM | POA: Diagnosis not present

## 2019-09-11 NOTE — Patient Instructions (Signed)
Dear [name],   It was good to see you! Thank you for taking your time to come in to be seen. Today, we discussed the following:   Back Pain   Your pain sounds like it is musculoskeletal in nature.  Unfortunately, given the health of your kidneys, we cannot use any anti-inflammatories like ibuprofen or Advil.  Usually these things go away with time.  I would like you to keep an eye on your back pain and note for any worsening.  I had like to follow-up with you in 1 to 2 weeks to see how you are doing.  I have refilled your medications as requested.  Please follow-up in 1 to 2 weeks as well for the dizziness that you are having.  If you have any falls, please call us immediately or go to the ED if it is an emergency.   Be well,   Genia Hotter, M.D   [facility] [phone]  *Sign up for MyChart for instant access to your health profile, labs, orders, upcoming appointments or to contact your provider with questions*  ===================================================================================

## 2019-09-11 NOTE — Progress Notes (Signed)
SUBJECTIVE:   CHIEF COMPLAINT / HPI:   Insomnia Doing well with current regimen. Patient is requesting that her two hydroxyzine medications be changed so that she can get 75 mg tabs. Would like to continue her current regimen.   Back pain Low back pain located bilaterally above posterior hips that shoots forward anteriorly at times. She reports that she has had this pain for over a year but has just recently worsened. She reports that she can only tolerate standing for 5 minutes at a time while walking or while working in the kitchen. She has to sit down until the pain goes away. She reports previously diagnosed with pinched nerve on left side. Pain is worse on left than right. Patient has tried tylenol with minor relief. Patient denies lower extremity weakness. Did have numbness of unilateral leg for which she was seen in the ED, but has not happened again. Patient denies any falls. Uses walker or cane at home if needed. She is not using any assistive device to appt today. Patient also reports chronic incontinence that was present prior to back pain. She denies saddle anaesthesia.   Dizziness/Walking instability During exam, patient reported that she was getting dizzy when standing up. She denies any falls at home. Other pertinent details as above. She reports that this has been going on for 1-2 months.   PERTINENT  PMH / PSH: Anxiety, hyperthyroidism, BMI 52  OBJECTIVE:   BP 128/72   Pulse 81   Wt (!) 335 lb (152 kg)   SpO2 97%   BMI 52.47 kg/m   Gen: Well appearing female, NAD Back: TTP just above iliac crest bilaterally. No TTP of spinous processes. Full ROM in B/L LE. 5/5 strength in all major LE muscle groups bilaterally. No sensory deficits.  Neuro: Gait normal. Negative trendelenburg.   ASSESSMENT/PLAN:   Gastro-esophageal reflux disease without esophagitis Takes PTX PRN  Type 2 diabetes mellitus with peripheral neuropathy (HCC) Planned to address today, but did not have  time. Received message from pharm team that patient would be good candidate for SGLT2. Follow up at next visit.   Low back pain at multiple sites Back pain symmetric and muscular in nature. Physical exam is WNL with no concerning findings. She has localized TTP around b/l iliac crest. No concern for radiculopathy or neurologic conditions.  Is most MSK low back pain is self-limited and resolves with time, will follow up with patient if patient continues to have severe acute symptoms.  Sleeping difficulty Patient requesting refill of ramelteon.  She reports that the combination of 75 mg hydroxyzine, ramelteon, melatonin, and trazodone is working well at this time.  We will continue this regimen.  Dizziness Multiple possible etiologies of dizziness.  Patient reports that it happens when she gets up, which could be due to orthostasis.  She is a poor historian and is unable to describe this sensation she feels and is unsure if she is spinning or the room is spinning.  She is also on many medications to help her sleep at night, as she denies taking any of these during the day and that her dizziness is specific to anytime of the day.  Patient had no issues walking into the office into exam room.  She intermittently feels dizziness on parts of physical exam and when standing up on physical exam, but not reproduced when she leaves the room.  Patient to follow-up for further evaluation and management at her earliest convenience.   Melene Plan, MD  [place]

## 2019-09-12 ENCOUNTER — Encounter: Payer: Self-pay | Admitting: Family Medicine

## 2019-09-12 MED ORDER — RAMELTEON 8 MG PO TABS: 8.0000 mg | ORAL_TABLET | Freq: Every evening | ORAL | 5 refills | Status: DC | PRN

## 2019-09-12 MED ORDER — HYDROXYZINE HCL 50 MG PO TABS: 75.0000 mg | ORAL_TABLET | Freq: Every day | ORAL | 2 refills | Status: DC

## 2019-09-12 NOTE — Assessment & Plan Note (Signed)
Takes PTX PRN

## 2019-09-12 NOTE — Assessment & Plan Note (Addendum)
Back pain symmetric and muscular in nature. Physical exam is WNL with no concerning findings. She has localized TTP around b/l iliac crest. No concern for radiculopathy or neurologic conditions.  Is most MSK low back pain is self-limited and resolves with time, will follow up with patient if patient continues to have severe acute symptoms.

## 2019-09-12 NOTE — Assessment & Plan Note (Signed)
Planned to address today, but did not have time. Received message from pharm team that patient would be good candidate for SGLT2. Follow up at next visit.

## 2019-09-13 DIAGNOSIS — G479 Sleep disorder, unspecified: Secondary | ICD-10-CM | POA: Insufficient documentation

## 2019-09-13 NOTE — Assessment & Plan Note (Signed)
Multiple possible etiologies of dizziness.  Patient reports that it happens when she gets up, which could be due to orthostasis.  She is a poor historian and is unable to describe this sensation she feels and is unsure if she is spinning or the room is spinning.  She is also on many medications to help her sleep at night, as she denies taking any of these during the day and that her dizziness is specific to anytime of the day.  Patient had no issues walking into the office into exam room.  She intermittently feels dizziness on parts of physical exam and when standing up on physical exam, but not reproduced when she leaves the room.  Patient to follow-up for further evaluation and management at her earliest convenience.

## 2019-09-13 NOTE — Assessment & Plan Note (Signed)
Patient requesting refill of ramelteon.  She reports that the combination of 75 mg hydroxyzine, ramelteon, melatonin, and trazodone is working well at this time.  We will continue this regimen.

## 2019-09-18 ENCOUNTER — Other Ambulatory Visit: Payer: Self-pay

## 2019-09-18 MED ORDER — METHIMAZOLE 10 MG PO TABS: 15.0000 mg | ORAL_TABLET | Freq: Every day | ORAL | 3 refills | Status: DC

## 2019-09-18 NOTE — Telephone Encounter (Signed)
Patient calls nurse line stating she has not heard from anyone in regards to endo referral. Looks like we have sent referral over to Dr. Jiles Crocker office, information given to patient to reach out and schedule. Patient states she will run out of Methimazole in 4 days, and is hoping to get another refill until she has her apt with Dr. Sharl Ma. Please advise.

## 2019-10-04 ENCOUNTER — Other Ambulatory Visit: Payer: Self-pay | Admitting: Family Medicine

## 2019-10-08 NOTE — Progress Notes (Signed)
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 She presents today for further evaluation regarding chronic diarrhea which has recently worsened over the past few weeks. She reports having watery nonbloody diarrhea 5 to 6 times daily. She has infrequent bright red blood on the toilet tissue if she wipes aggressively. No blood on the stool. She has LLQ discomfort which occurs right before she has diarrhea and this pain lessens after defecation. She has used hyoscyamine without improvement. She has lactose intolerance. She last took an antibiotic for 5 days 3 weeks ago for a UTI. She underwent a colonoscopy 03/31/2018 which was incomplete due to a poor prep. She vomited a portion of the bowel prep. She developed hypotension post procedure which resolved without hospital admission. History of gastric ulcers  08/2017. No current GERD symptoms. She remains on Pantoprazole 40mg  bid. Repeat  EGD 01/2018 showed a 2cm hiatal hernia otherwise was normal.   Labs 08/21/2019: WBC 8.4. Hg 11.3. HCT 37.1. Na 144. K 4.3. BUN 18. Cr. 1.19.   Colonoscopy 03/31/2018: - Preparation of the colon was poor and inadequate for screening purposes. - Of what was seen of the colonic mucosa, no inflammatory changes were noted. - Biopsies were taken with a cold forceps from the right colon and left colon for   evaluation of   microscopic colitis were negative.   -Note she had some significant hypotension associated with the procedure and   required prolonged period of time to recover.  EGD 01/31/2018: - Esophagogastric landmarks identified. - 2 cm hiatal hernia. - Erythematous mucosa in the  antrum. - Normal stomach otherwise. Biopsies taken to rule out H pylori. - Normal duodenal bulb and second portion of the duodenum. Biopsies negative     for Celiac disease.   EGD 08/16/2017: 3-74mm clean based gastric ulcer, gastritis, a few nonbleeding duodenal ulcers, biopsies negative for H pylori  Colonoscopy 2013:  Dr. 2014, few small adenomas  Current Outpatient Medications on File Prior to Visit  Medication Sig Dispense Refill  . albuterol (VENTOLIN HFA) 108 (90 Base) MCG/ACT inhaler Inhale 2 puffs into the lungs every 6 (six) hours as needed for wheezing or shortness of breath. 18 g 5  . amLODipine (NORVASC) 5 MG tablet Take 1 tablet (5 mg total) by mouth daily. 90 tablet 3  . atenolol (TENORMIN) 100 MG tablet TAKE ONE TABLET BY MOUTH EVERY DAY FOR HIGH BLOOD PRESSURE (Patient taking differently: Take 100 mg by mouth daily. ) 90 tablet 3  . Blood Pressure Monitoring (BLOOD PRESSURE MON/AUTO/WRIST) DEVI 1 Units by Does not apply route daily. 1 Device 0  . Diaper Rash Products (DESITIN MULTI-PURPOSE HEALING) OINT Apply 1 application topically 2 (two) times daily. 397 g 0  . fluticasone furoate-vilanterol (BREO ELLIPTA) 100-25 MCG/INH AEPB Inhale 1 puff into the lungs daily. 60 each 5  . gabapentin (NEURONTIN) 300 MG capsule Take 3 capsules (900 mg total) by mouth at bedtime. 90 capsule 2  . hydrOXYzine (ATARAX/VISTARIL) 25 MG tablet Take 25 mg by mouth at bedtime.    . hydrOXYzine (ATARAX/VISTARIL) 50 MG tablet Take 1.5 tablets (75 mg total) by mouth at bedtime. 90  tablet 2  . methimazole (TAPAZOLE) 10 MG tablet Take 1.5 tablets (15 mg total) by mouth daily. 45 tablet 3  . ondansetron (ZOFRAN-ODT) 8 MG disintegrating tablet Take 1 tablet (8 mg total) by mouth every 8 (eight) hours as needed for nausea or vomiting. 90 tablet 1  . pantoprazole (PROTONIX) 40 MG tablet Take 1 tablet (40 mg total) by mouth 2 (two) times daily. 60 tablet 0  . ramelteon (ROZEREM) 8 MG tablet Take 1 tablet (8 mg  total) by mouth at bedtime as needed for sleep. 30 tablet 5  . traZODone (DESYREL) 50 MG tablet Take 1 tablet (50 mg total) by mouth at bedtime. 60 tablet 0  . Vitamin D, Ergocalciferol, (DRISDOL) 1.25 MG (50000 UNIT) CAPS capsule Take 50,000 Units by mouth every 7 (seven) days.    . [DISCONTINUED] traZODone (DESYREL) 50 MG tablet Take 1 tablet (50 mg total) by mouth at bedtime. 60 tablet 0   No current facility-administered medications on file prior to visit.   Allergies  Allergen Reactions  . Buprenorphine Hcl Hives  . Morphine And Related Hives and Dermatitis     Current Medications, Allergies, Past Medical History, Past Surgical History, Family History and Social History were reviewed in [employer] record.   Physical Exam: BP 104/80 (BP Location: Left Wrist, Patient Position: Sitting, Cuff Size: Normal)   Pulse 60   Temp 99.1 F (37.3 C)   Ht 5\' 6"  (1.676 m) Comment: height measured without shoes  Wt (!) 338 lb (153.3 kg)   BMI 54.55 kg/m   General: Obese 59 year old female in no acute distress. Head: Normocephalic and atraumatic. Neck: Past trache scar intact.  Eyes:  No scleral icterus. Conjunctiva pink . Ears: Normal auditory acuity. Lungs: Clear throughout to auscultation. Heart: Regular rate and rhythm, no murmur. Abdomen: Soft, nontender and nondistended. No masses or hepatomegaly. Normal bowel sounds x 4 quadrants.  Rectal: Deferred.  Musculoskeletal: Symmetrical with no gross deformities. Extremities: No edema. Neurological: Alert oriented x 4. No focal deficits.  Psychological:  Alert and cooperative. Normal mood and affect  Assessment and Recommendations:  31. 59 year old female with acute on chronic diarrhea with LLQ abdominal cramping which resolves after defecation.  -GI pathogen, CBC, CMP and CRP -Dicyclomine 10mg  1 tab po bid prior to breakfast and dinner. -Cholestyramine 4gm packet once daily -Colonoscopy at [facility]. Colonoscopy  benefits and risks discussed including risk with sedation, risk of bleeding, perforation and infection   2. History of colon adenomatous polyps  3. History of gastric ulcers 08/2017. Remains on PPI bid.   4. History of obstructive sleep apnea intolerant to CPAP, required a tracheostomy until 2016.   5. CVA x 2  6. HTN  7. CKD stage III

## 2019-10-09 ENCOUNTER — Ambulatory Visit: Payer: Medicaid Other | Admitting: Nurse Practitioner

## 2019-10-09 ENCOUNTER — Encounter: Payer: Self-pay | Admitting: Nurse Practitioner

## 2019-10-09 ENCOUNTER — Other Ambulatory Visit (INDEPENDENT_AMBULATORY_CARE_PROVIDER_SITE_OTHER): Payer: Medicaid Other

## 2019-10-09 VITALS — BP 104/80 | HR 60 | Temp 99.1°F | Ht 66.0 in | Wt 338.0 lb

## 2019-10-09 DIAGNOSIS — K2100 Gastro-esophageal reflux disease with esophagitis, without bleeding: Secondary | ICD-10-CM

## 2019-10-09 DIAGNOSIS — Z6841 Body Mass Index (BMI) 40.0 and over, adult: Secondary | ICD-10-CM

## 2019-10-09 DIAGNOSIS — R197 Diarrhea, unspecified: Secondary | ICD-10-CM

## 2019-10-09 DIAGNOSIS — R1032 Left lower quadrant pain: Secondary | ICD-10-CM | POA: Diagnosis not present

## 2019-10-09 LAB — COMPREHENSIVE METABOLIC PANEL
ALT: 10 U/L (ref 0–35)
AST: 11 U/L (ref 0–37)
Albumin: 4 g/dL (ref 3.5–5.2)
Alkaline Phosphatase: 103 U/L (ref 39–117)
BUN: 19 mg/dL (ref 6–23)
CO2: 26 mEq/L (ref 19–32)
Calcium: 9 mg/dL (ref 8.4–10.5)
Chloride: 107 mEq/L (ref 96–112)
Creatinine, Ser: 1.2 mg/dL (ref 0.40–1.20)
GFR: 55.8 mL/min — ABNORMAL LOW (ref >60.00)
Glucose, Bld: 159 mg/dL — ABNORMAL HIGH (ref 70–99)
Potassium: 4.1 mEq/L (ref 3.5–5.1)
Sodium: 140 mEq/L (ref 135–145)
Total Bilirubin: 0.4 mg/dL (ref 0.2–1.2)
Total Protein: 7.3 g/dL (ref 6.0–8.3)

## 2019-10-09 LAB — CBC WITH DIFFERENTIAL/PLATELET
Basophils Absolute: 0.1 K/uL (ref 0.0–0.1)
Basophils Relative: 1.1 % (ref 0.0–3.0)
Eosinophils Absolute: 0.3 K/uL (ref 0.0–0.7)
Eosinophils Relative: 3.1 % (ref 0.0–5.0)
HCT: 37.7 % (ref 36.0–46.0)
Hemoglobin: 12.2 g/dL (ref 12.0–15.0)
Lymphocytes Relative: 36.4 % (ref 12.0–46.0)
Lymphs Abs: 3.4 K/uL (ref 0.7–4.0)
MCHC: 32.3 g/dL (ref 30.0–36.0)
MCV: 85.5 fl (ref 78.0–100.0)
Monocytes Absolute: 0.7 K/uL (ref 0.1–1.0)
Monocytes Relative: 7.4 % (ref 3.0–12.0)
Neutro Abs: 4.8 K/uL (ref 1.4–7.7)
Neutrophils Relative %: 52 % (ref 43.0–77.0)
Platelets: 384 K/uL (ref 150.0–400.0)
RBC: 4.41 Mil/uL (ref 3.87–5.11)
RDW: 14.2 % (ref 11.5–15.5)
WBC: 9.3 K/uL (ref 4.0–10.5)

## 2019-10-09 LAB — C-REACTIVE PROTEIN: CRP: 1.2 mg/dL (ref 0.5–20.0)

## 2019-10-09 MED ORDER — NA SULFATE-K SULFATE-MG SULF 17.5-3.13-1.6 GM/177ML PO SOLN: 1.0000 | Freq: Once | ORAL | 0 refills | Status: AC

## 2019-10-09 MED ORDER — CHOLESTYRAMINE 4 G PO PACK: PACK | ORAL | 1 refills | Status: DC

## 2019-10-09 MED ORDER — DICYCLOMINE HCL 10 MG PO CAPS: 10.0000 mg | ORAL_CAPSULE | Freq: Two times a day (BID) | ORAL | 0 refills | Status: DC

## 2019-10-09 NOTE — Patient Instructions (Addendum)
If you are age 58 or older, your body mass index should be between 23-30. Your Body mass index is 54.55 kg/m. If this is out of the aforementioned range listed, please consider follow up with your Primary Care Provider.  If you are age 19 or younger, your body mass index should be between 19-25. Your Body mass index is 54.55 kg/m. If this is out of the aformentioned range listed, please consider follow up with your Primary Care Provider.   Your provider has requested that you go to the basement level for lab work before leaving today. Press "B" on the elevator. The lab is located at the first door on the left as you exit the elevator.  We have sent the following medications to your pharmacy for you to pick up at your convenience:  Dicyclomine cholestyromine  Due to recent changes in healthcare laws, you may see the results of your imaging and laboratory studies on MyChart before your provider has had a chance to review them.  We understand that in some cases there may be results that are confusing or concerning to you. Not all laboratory results come back in the same time frame and the provider may be waiting for multiple results in order to interpret others.  Please give [place] 48 hours in order for your provider to thoroughly review all the results before contacting the office for clarification of your results.

## 2019-10-10 ENCOUNTER — Telehealth: Payer: Self-pay | Admitting: General Surgery

## 2019-10-10 DIAGNOSIS — R1032 Left lower quadrant pain: Secondary | ICD-10-CM

## 2019-10-10 DIAGNOSIS — R197 Diarrhea, unspecified: Secondary | ICD-10-CM

## 2019-10-10 NOTE — Telephone Encounter (Signed)
Patient scheduled at [facility] for colonoscopy with Dr Adela Lank on 12/04/2019 at 9:30. Patient had a bad prep for her last colonoscopy. She will need a 2 day prep with Suprep.

## 2019-10-10 NOTE — Progress Notes (Signed)
Agree with assessment and plan as outlined.  

## 2019-10-11 ENCOUNTER — Ambulatory Visit (INDEPENDENT_AMBULATORY_CARE_PROVIDER_SITE_OTHER): Payer: Medicaid Other | Admitting: Family Medicine

## 2019-10-11 ENCOUNTER — Other Ambulatory Visit: Payer: Self-pay

## 2019-10-11 VITALS — BP 110/70 | HR 89 | Ht 66.0 in | Wt 336.0 lb

## 2019-10-11 DIAGNOSIS — E1142 Type 2 diabetes mellitus with diabetic polyneuropathy: Secondary | ICD-10-CM

## 2019-10-11 DIAGNOSIS — R1030 Lower abdominal pain, unspecified: Secondary | ICD-10-CM

## 2019-10-11 DIAGNOSIS — R300 Dysuria: Secondary | ICD-10-CM | POA: Diagnosis not present

## 2019-10-11 LAB — POCT UA - MICROSCOPIC ONLY

## 2019-10-11 LAB — POCT GLYCOSYLATED HEMOGLOBIN (HGB A1C): HbA1c, POC (controlled diabetic range): 6.6 % (ref 0.0–7.0)

## 2019-10-11 LAB — GLUCOSE, POCT (MANUAL RESULT ENTRY): POC Glucose: 152 mg/dl — AB (ref 70–99)

## 2019-10-11 LAB — POCT URINALYSIS DIP (MANUAL ENTRY)
Bilirubin, UA: NEGATIVE
Glucose, UA: NEGATIVE mg/dL
Ketones, POC UA: NEGATIVE mg/dL
Leukocytes, UA: NEGATIVE
Nitrite, UA: NEGATIVE
Protein Ur, POC: NEGATIVE mg/dL
Spec Grav, UA: 1.02 (ref 1.010–1.025)
Urobilinogen, UA: 0.2 E.U./dL
pH, UA: 7 (ref 5.0–8.0)

## 2019-10-11 MED ORDER — PROMETHAZINE HCL 12.5 MG PO TABS: 12.5000 mg | ORAL_TABLET | Freq: Every evening | ORAL | 0 refills | Status: DC | PRN

## 2019-10-11 MED ORDER — DICYCLOMINE HCL 10 MG PO CAPS: 10.0000 mg | ORAL_CAPSULE | Freq: Two times a day (BID) | ORAL | 0 refills | Status: DC

## 2019-10-11 NOTE — Progress Notes (Signed)
    SUBJECTIVE:   CHIEF COMPLAINT / HPI:   Nausea, dizziness, dysuria Nausea, dizziness, and LUQ cramping abdominal pain for the past 2-3 days.  No known trigger.  These symptoms occurred last month as well, and she was diagnosed with a UTI at that time.  She also complains of R sided flank pain, dysuria, and urinary frequency since Monday.  Denies chest pain.  Denies fever but endorses chills.  Reports that she has chronic IBS that includes both diarrhea and constipation, and she has not had any recent changes to her bowel movements.  She says that she has chronic shortness of breath as well, and she is not having an acute exacerbation.  She denies blood in her stool, loss of appetite.  She says that she seems to be in pain "all the time."  Per chart review, it appears that patient was found to have a UTI on 2/9 at her visit to the emergency department.  The culture was significant for ESBL, and she was treated with Macrobid.  PERTINENT  PMH / PSH: Type 2 diabetes, well controlled, CVA, hypertension, IBS, anxiety, chronic pain  OBJECTIVE:   BP 110/70   Pulse 89   Ht 5\' 6"  (1.676 m)   Wt (!) 336 lb (152.4 kg)   SpO2 96%   BMI 54.23 kg/m   General: Appears uncomfortable, morbidly obese Cardiac: RRR, no MRG Respiratory: CTAB, no rhonchi, rales, or wheezing, normal work of breathing Abdomen: Normal bowel sounds, soft, mild tenderness in the LUQ, mild right-sided flank tenderness to palpation Psych: appropriate mood and affect    ASSESSMENT/PLAN:   Lower abdominal pain Appears to be acute on chronic.  Associated with nausea today.  UA was within normal limits, reassurance given.  Patient could have diverticulitis given her age and the location of her pain, but since this appears to be more indolent and she is afebrile, I am less concerned about this currently.  We will treat symptomatically and conservatively with Bentyl and Phenergan since Zofran has been ineffective in the past.   Counseled her that Phenergan is sedating, so she should only take it before bedtime.  Type 2 diabetes mellitus with peripheral neuropathy (HCC) A1c is stable at 6.6 today.  Since she had a recent UTI, would not start Jardiance today.  She was counseled that she may benefit from Metformin, but she declined today.     , MD [facility]

## 2019-10-11 NOTE — Assessment & Plan Note (Signed)
A1c is stable at 6.6 today.  Since she had a recent UTI, would not start Jardiance today.  She was counseled that she may benefit from Metformin, but she declined today.

## 2019-10-11 NOTE — Assessment & Plan Note (Signed)
Appears to be acute on chronic.  Associated with nausea today.  UA was within normal limits, reassurance given.  Patient could have diverticulitis given her age and the location of her pain, but since this appears to be more indolent and she is afebrile, I am less concerned about this currently.  We will treat symptomatically and conservatively with Bentyl and Phenergan since Zofran has been ineffective in the past.  Counseled her that Phenergan is sedating, so she should only take it before bedtime.

## 2019-10-11 NOTE — Patient Instructions (Signed)
It was nice meeting you today Yesenia [name]!  Your A1c, blood sugar, and urine looked good today, so we will work on treating your abdominal pain and nausea today.  Since your bowel movements have not changed and your pain appears more chronic, I do not think that you have an infection in your gut currently.  However, if you develop fever and increased pain, please go to urgent care or the emergency department.  You can also call her after-hours line if you would like to discuss this with the physician.  We will try Bentyl, an antispasmodic, for your stomach pain and Phenergan for your nausea since Zofran does not work for you.  Phenergan can make you sleepy, so please only take this before bedtime.    If you have any questions or concerns, please feel free to call the clinic.   Be well,  Dr. Frances Furbish

## 2019-10-29 ENCOUNTER — Other Ambulatory Visit: Payer: Self-pay | Admitting: Family Medicine

## 2019-11-02 ENCOUNTER — Ambulatory Visit: Payer: Medicaid Other | Admitting: Family Medicine

## 2019-11-13 ENCOUNTER — Ambulatory Visit: Payer: Medicaid Other | Admitting: Family Medicine

## 2019-11-13 ENCOUNTER — Other Ambulatory Visit: Payer: Self-pay | Admitting: Family Medicine

## 2019-11-14 NOTE — Telephone Encounter (Signed)
Patient calls nurse line to check on status of rx refilll.   Veronda Prude, RN

## 2019-11-27 ENCOUNTER — Telehealth: Payer: Self-pay | Admitting: Gastroenterology

## 2019-11-27 NOTE — Telephone Encounter (Signed)
Patient's procedure cancelled.

## 2019-11-30 ENCOUNTER — Other Ambulatory Visit (HOSPITAL_COMMUNITY): Payer: Medicaid Other

## 2019-12-04 ENCOUNTER — Encounter (HOSPITAL_COMMUNITY): Admission: RE | Payer: Self-pay | Source: Home / Self Care

## 2019-12-04 ENCOUNTER — Ambulatory Visit (HOSPITAL_COMMUNITY): Admission: RE | Admit: 2019-12-04 | Payer: Medicaid Other | Source: Home / Self Care | Admitting: Gastroenterology

## 2019-12-04 SURGERY — COLONOSCOPY WITH PROPOFOL
Anesthesia: Monitor Anesthesia Care

## 2019-12-05 ENCOUNTER — Telehealth: Payer: Self-pay

## 2019-12-05 NOTE — Telephone Encounter (Signed)
Called and left message for pt to call back regarding rescheduling her procedure with Dr. Adela Lank.   Spoke to pt. She indicated that she is out of town and will call us when she is back when she is ready to reschedule her procedure.

## 2019-12-07 ENCOUNTER — Other Ambulatory Visit: Payer: Self-pay | Admitting: Family Medicine

## 2019-12-11 ENCOUNTER — Other Ambulatory Visit: Payer: Self-pay | Admitting: Family Medicine

## 2019-12-13 ENCOUNTER — Other Ambulatory Visit: Payer: Self-pay

## 2019-12-13 MED ORDER — CHOLESTYRAMINE 4 G PO PACK: PACK | ORAL | 1 refills | Status: DC

## 2019-12-14 ENCOUNTER — Telehealth: Payer: Self-pay | Admitting: Gastroenterology

## 2019-12-17 ENCOUNTER — Other Ambulatory Visit: Payer: Self-pay

## 2019-12-17 DIAGNOSIS — R1032 Left lower quadrant pain: Secondary | ICD-10-CM

## 2019-12-17 DIAGNOSIS — R197 Diarrhea, unspecified: Secondary | ICD-10-CM

## 2019-12-17 NOTE — Telephone Encounter (Signed)
Patient has been scheduled for colon, pre-visit, and Covid screen all appt are in Epic.  Patient aware and verbalized understanding.

## 2019-12-22 ENCOUNTER — Other Ambulatory Visit: Payer: Self-pay | Admitting: Gastroenterology

## 2019-12-27 ENCOUNTER — Ambulatory Visit: Payer: Medicaid Other | Admitting: Family Medicine

## 2020-01-17 ENCOUNTER — Ambulatory Visit: Payer: Medicaid Other | Admitting: Family Medicine

## 2020-01-18 ENCOUNTER — Other Ambulatory Visit: Payer: Self-pay | Admitting: Family Medicine

## 2020-01-23 ENCOUNTER — Ambulatory Visit: Payer: Medicaid Other | Admitting: Family Medicine

## 2020-01-31 ENCOUNTER — Other Ambulatory Visit: Payer: Self-pay | Admitting: Family Medicine

## 2020-02-04 ENCOUNTER — Telehealth: Payer: Self-pay | Admitting: *Deleted

## 2020-02-04 ENCOUNTER — Other Ambulatory Visit: Payer: Self-pay | Admitting: Family Medicine

## 2020-02-04 NOTE — Telephone Encounter (Signed)
noted 

## 2020-02-04 NOTE — Telephone Encounter (Signed)
Hi Kristen, I think that is fine. I would also counsel her on gatorade / Miralax prep and if she is not cleared out well enough despite full Sutab prep, she can drink some of the Miralax / gatorade solution until clear. Thanks

## 2020-02-04 NOTE — Telephone Encounter (Signed)
Dr. Adela Lank,  This pt is coming in for a PV on 02-13-20; her hospital colonoscopy is 02-25-20.  According to office note, her last colonoscopy prep was poor d/t her vomiting up large amount.   I was going to try Sutab with her- would it be ok to give her a Zofran rx prior to her taking the prep?  Please advise.  Thanks, [employer]

## 2020-02-08 ENCOUNTER — Telehealth: Payer: Self-pay | Admitting: Gastroenterology

## 2020-02-08 MED ORDER — CHOLESTYRAMINE 4 G PO PACK: PACK | ORAL | 1 refills | Status: DC

## 2020-02-08 NOTE — Telephone Encounter (Signed)
Patient is advised that no openings at this point for hospital cases.  November schedule is not out. She has to travel and attend court due to her father's estate.  She will call back when she is ready to reschedule.

## 2020-02-08 NOTE — Telephone Encounter (Signed)
Pt needs to rsx her procedure scheduled at the hospital with Dr Adela Lank for 8/16 as well as the previsit. Pt needs to go to [facility].

## 2020-02-12 ENCOUNTER — Other Ambulatory Visit: Payer: Self-pay

## 2020-02-12 ENCOUNTER — Encounter: Payer: Self-pay | Admitting: Family Medicine

## 2020-02-12 ENCOUNTER — Ambulatory Visit (INDEPENDENT_AMBULATORY_CARE_PROVIDER_SITE_OTHER): Payer: Medicaid Other | Admitting: Family Medicine

## 2020-02-12 VITALS — BP 108/64 | HR 66 | Ht 66.0 in | Wt 339.0 lb

## 2020-02-12 DIAGNOSIS — M545 Low back pain: Secondary | ICD-10-CM

## 2020-02-12 DIAGNOSIS — H9190 Unspecified hearing loss, unspecified ear: Secondary | ICD-10-CM | POA: Diagnosis not present

## 2020-02-12 DIAGNOSIS — G8929 Other chronic pain: Secondary | ICD-10-CM | POA: Diagnosis not present

## 2020-02-12 DIAGNOSIS — E1142 Type 2 diabetes mellitus with diabetic polyneuropathy: Secondary | ICD-10-CM

## 2020-02-12 DIAGNOSIS — R200 Anesthesia of skin: Secondary | ICD-10-CM | POA: Diagnosis present

## 2020-02-12 LAB — POCT GLYCOSYLATED HEMOGLOBIN (HGB A1C): HbA1c, POC (controlled diabetic range): 6.5 % (ref 0.0–7.0)

## 2020-02-12 NOTE — Progress Notes (Signed)
    SUBJECTIVE:  CHIEF COMPLAINT / HPI:   Hearing Issues TV has to be on very loud volume in order for her to hear.  Had a hearing test done previously and reports that one was not that great, but can't remember which ear.  No itching, pain, ringing. No recent traumas.   Back pain  Lower right side x one year. Patient reports it has been worsening. Previously worse on left than on right. Now worse on right. Stabbing shooting pain. Occurs after standing 3-4 minutes. Goes away on its own after about thirty minutes.   Right sided leg numbness Happening x 1.5 weeks. Started with ankle and then went up to knee. Patient reports that it feels like a "rubberband was around my ankle" and then had the seen feeling up on her knee a few days later. No other areas of parasitica. No gait abnormalities.   PERTINENT  PMH / PSH: hypothyroidism, DM II w/ neuropathy    OBJECTIVE:  BP 108/64   Pulse 66   Ht 5\' 6"  (1.676 m)   Wt (!) 339 lb (153.8 kg)   SpO2 96%   BMI 54.72 kg/m   General: well appearing  MSK: TTP of lower right back and right gluteal area. Landmarks difficult to palpate given body habitus. Decreased sensation or right lower extremity distal to knee. No TTP of lumbar spinous processes.   ASSESSMENT/PLAN:  Right leg numbness Decreased patient with history of peripheral neuropathy.  This pattern of numbness is not consistent with that.  Would like patient to get EMGs and refer to neurology.    Decreased hearing No abnormalities on physical exam today.  Patient has been told that she has a Pap smear in the past but is unsure which.  Will refer to audiology.   Chronic right-sided low back pain without sciatica Patient's low back pain is consistent with previous physical exam from March 2021.  She does not have any red flags or weakness, bladder or bowel incontinence.  Patient's BMI is 55. Will refer to physical therapy for stretching, massage and strengthening exercises.       April 2021, MD [facility]

## 2020-02-13 ENCOUNTER — Encounter: Payer: Self-pay | Admitting: Family Medicine

## 2020-02-13 DIAGNOSIS — H9190 Unspecified hearing loss, unspecified ear: Secondary | ICD-10-CM | POA: Insufficient documentation

## 2020-02-13 DIAGNOSIS — R200 Anesthesia of skin: Secondary | ICD-10-CM | POA: Insufficient documentation

## 2020-02-13 NOTE — Assessment & Plan Note (Signed)
Decreased patient with history of peripheral neuropathy.  This pattern of numbness is not consistent with that.  Would like patient to get EMGs and refer to neurology.

## 2020-02-13 NOTE — Assessment & Plan Note (Signed)
No abnormalities on physical exam today.  Patient has been told that she has a Pap smear in the past but is unsure which.  Will refer to audiology.

## 2020-02-13 NOTE — Assessment & Plan Note (Signed)
Patient's low back pain is consistent with previous physical exam from March 2021.  She does not have any red flags or weakness, bladder or bowel incontinence.  Patient's BMI is 55. Will refer to physical therapy for stretching, massage and strengthening exercises.

## 2020-02-21 ENCOUNTER — Other Ambulatory Visit (HOSPITAL_COMMUNITY): Payer: Medicaid Other

## 2020-02-22 ENCOUNTER — Telehealth: Payer: Self-pay

## 2020-02-22 MED ORDER — AMLODIPINE BESYLATE 5 MG PO TABS: 5.0000 mg | ORAL_TABLET | Freq: Every day | ORAL | 3 refills | Status: DC

## 2020-02-22 NOTE — Telephone Encounter (Signed)
Received phone call from patient regarding amlodipine rx. Patient states that she never received from pharmacy. Called and spoke with pharmacist, they have no record of rx refill. Gave verbal order for medication per Dr. Elmyra Ricks instructions on e-script.   FYI to PCP  Veronda Prude, RN

## 2020-02-25 ENCOUNTER — Encounter (HOSPITAL_COMMUNITY): Payer: Self-pay

## 2020-02-25 ENCOUNTER — Ambulatory Visit (HOSPITAL_COMMUNITY): Admit: 2020-02-25 | Payer: Medicaid Other | Admitting: Gastroenterology

## 2020-02-25 SURGERY — COLONOSCOPY WITH PROPOFOL
Anesthesia: Monitor Anesthesia Care

## 2020-02-27 ENCOUNTER — Ambulatory Visit: Payer: Medicaid Other | Admitting: Audiologist

## 2020-03-12 ENCOUNTER — Ambulatory Visit: Payer: Medicaid Other | Admitting: Audiologist

## 2020-03-13 ENCOUNTER — Ambulatory Visit: Payer: Medicaid Other | Admitting: Family Medicine

## 2020-03-18 ENCOUNTER — Other Ambulatory Visit: Payer: Self-pay | Admitting: Otolaryngology

## 2020-03-18 DIAGNOSIS — R1310 Dysphagia, unspecified: Secondary | ICD-10-CM

## 2020-03-24 ENCOUNTER — Institutional Professional Consult (permissible substitution): Payer: Medicaid Other | Admitting: Neurology

## 2020-03-27 ENCOUNTER — Ambulatory Visit (HOSPITAL_COMMUNITY)
Admission: EM | Admit: 2020-03-27 | Discharge: 2020-03-27 | Disposition: A | Discharge status: Home / Self Care | Payer: Medicaid Other | Attending: Internal Medicine | Admitting: Internal Medicine

## 2020-03-27 ENCOUNTER — Encounter (HOSPITAL_COMMUNITY): Payer: Self-pay | Admitting: Emergency Medicine

## 2020-03-27 ENCOUNTER — Other Ambulatory Visit: Payer: Self-pay

## 2020-03-27 ENCOUNTER — Other Ambulatory Visit: Payer: Self-pay | Admitting: Family Medicine

## 2020-03-27 DIAGNOSIS — E1142 Type 2 diabetes mellitus with diabetic polyneuropathy: Secondary | ICD-10-CM | POA: Diagnosis not present

## 2020-03-27 DIAGNOSIS — R112 Nausea with vomiting, unspecified: Secondary | ICD-10-CM | POA: Insufficient documentation

## 2020-03-27 DIAGNOSIS — F419 Anxiety disorder, unspecified: Secondary | ICD-10-CM | POA: Insufficient documentation

## 2020-03-27 DIAGNOSIS — K760 Fatty (change of) liver, not elsewhere classified: Secondary | ICD-10-CM | POA: Diagnosis not present

## 2020-03-27 DIAGNOSIS — Z20822 Contact with and (suspected) exposure to covid-19: Secondary | ICD-10-CM | POA: Insufficient documentation

## 2020-03-27 DIAGNOSIS — Z90721 Acquired absence of ovaries, unilateral: Secondary | ICD-10-CM | POA: Insufficient documentation

## 2020-03-27 DIAGNOSIS — R197 Diarrhea, unspecified: Secondary | ICD-10-CM | POA: Diagnosis not present

## 2020-03-27 DIAGNOSIS — Z7951 Long term (current) use of inhaled steroids: Secondary | ICD-10-CM | POA: Insufficient documentation

## 2020-03-27 DIAGNOSIS — M545 Low back pain: Secondary | ICD-10-CM | POA: Insufficient documentation

## 2020-03-27 DIAGNOSIS — F332 Major depressive disorder, recurrent severe without psychotic features: Secondary | ICD-10-CM | POA: Insufficient documentation

## 2020-03-27 DIAGNOSIS — Z8719 Personal history of other diseases of the digestive system: Secondary | ICD-10-CM | POA: Insufficient documentation

## 2020-03-27 DIAGNOSIS — Z79899 Other long term (current) drug therapy: Secondary | ICD-10-CM | POA: Diagnosis not present

## 2020-03-27 DIAGNOSIS — J449 Chronic obstructive pulmonary disease, unspecified: Secondary | ICD-10-CM | POA: Diagnosis not present

## 2020-03-27 DIAGNOSIS — M797 Fibromyalgia: Secondary | ICD-10-CM | POA: Insufficient documentation

## 2020-03-27 DIAGNOSIS — K219 Gastro-esophageal reflux disease without esophagitis: Secondary | ICD-10-CM | POA: Diagnosis not present

## 2020-03-27 DIAGNOSIS — G4733 Obstructive sleep apnea (adult) (pediatric): Secondary | ICD-10-CM | POA: Insufficient documentation

## 2020-03-27 DIAGNOSIS — I129 Hypertensive chronic kidney disease with stage 1 through stage 4 chronic kidney disease, or unspecified chronic kidney disease: Secondary | ICD-10-CM | POA: Insufficient documentation

## 2020-03-27 DIAGNOSIS — N183 Chronic kidney disease, stage 3 unspecified: Secondary | ICD-10-CM | POA: Diagnosis not present

## 2020-03-27 DIAGNOSIS — Z885 Allergy status to narcotic agent status: Secondary | ICD-10-CM | POA: Insufficient documentation

## 2020-03-27 DIAGNOSIS — R079 Chest pain, unspecified: Secondary | ICD-10-CM | POA: Insufficient documentation

## 2020-03-27 DIAGNOSIS — E785 Hyperlipidemia, unspecified: Secondary | ICD-10-CM | POA: Insufficient documentation

## 2020-03-27 DIAGNOSIS — Z930 Tracheostomy status: Secondary | ICD-10-CM | POA: Insufficient documentation

## 2020-03-27 DIAGNOSIS — G8929 Other chronic pain: Secondary | ICD-10-CM | POA: Insufficient documentation

## 2020-03-27 DIAGNOSIS — E0500 Thyrotoxicosis with diffuse goiter without thyrotoxic crisis or storm: Secondary | ICD-10-CM | POA: Diagnosis not present

## 2020-03-27 DIAGNOSIS — E1122 Type 2 diabetes mellitus with diabetic chronic kidney disease: Secondary | ICD-10-CM | POA: Insufficient documentation

## 2020-03-27 DIAGNOSIS — Z8673 Personal history of transient ischemic attack (TIA), and cerebral infarction without residual deficits: Secondary | ICD-10-CM | POA: Diagnosis not present

## 2020-03-27 DIAGNOSIS — E559 Vitamin D deficiency, unspecified: Secondary | ICD-10-CM | POA: Insufficient documentation

## 2020-03-27 DIAGNOSIS — K589 Irritable bowel syndrome without diarrhea: Secondary | ICD-10-CM | POA: Diagnosis not present

## 2020-03-27 LAB — BASIC METABOLIC PANEL
Anion gap: 9 (ref 5–15)
BUN: 9 mg/dL (ref 6–20)
CO2: 23 mmol/L (ref 22–32)
Calcium: 9 mg/dL (ref 8.9–10.3)
Chloride: 110 mmol/L (ref 98–111)
Creatinine, Ser: 1.68 mg/dL — ABNORMAL HIGH (ref 0.44–1.00)
GFR calc Af Amer: 38 mL/min — ABNORMAL LOW (ref >60)
GFR calc non Af Amer: 33 mL/min — ABNORMAL LOW (ref >60)
Glucose, Bld: 189 mg/dL — ABNORMAL HIGH (ref 70–99)
Potassium: 4.7 mmol/L (ref 3.5–5.1)
Sodium: 142 mmol/L (ref 135–145)

## 2020-03-27 LAB — CBC WITH DIFFERENTIAL/PLATELET
Abs Immature Granulocytes: 0.07 10*3/uL (ref 0.00–0.07)
Basophils Absolute: 0.1 10*3/uL (ref 0.0–0.1)
Basophils Relative: 1 %
Eosinophils Absolute: 0.3 10*3/uL (ref 0.0–0.5)
Eosinophils Relative: 3 %
HCT: 41.2 % (ref 36.0–46.0)
Hemoglobin: 12.8 g/dL (ref 12.0–15.0)
Immature Granulocytes: 1 %
Lymphocytes Relative: 26 %
Lymphs Abs: 2.5 10*3/uL (ref 0.7–4.0)
MCH: 27.2 pg (ref 26.0–34.0)
MCHC: 31.1 g/dL (ref 30.0–36.0)
MCV: 87.5 fL (ref 80.0–100.0)
Monocytes Absolute: 0.7 10*3/uL (ref 0.1–1.0)
Monocytes Relative: 7 %
Neutro Abs: 6.1 10*3/uL (ref 1.7–7.7)
Neutrophils Relative %: 62 %
Platelets: 386 10*3/uL (ref 150–400)
RBC: 4.71 MIL/uL (ref 3.87–5.11)
RDW: 14.3 % (ref 11.5–15.5)
WBC: 9.7 10*3/uL (ref 4.0–10.5)
nRBC: 0 % (ref 0.0–0.2)

## 2020-03-27 LAB — SARS CORONAVIRUS 2 (TAT 6-24 HRS): SARS Coronavirus 2: NEGATIVE

## 2020-03-27 MED ORDER — ACETAMINOPHEN 325 MG PO TABS
ORAL_TABLET | ORAL | Status: AC
  Filled 2020-03-27: qty 2

## 2020-03-27 MED ORDER — ONDANSETRON 4 MG PO TBDP
4.0000 mg | ORAL_TABLET | Freq: Once | ORAL | Status: AC
  Administered 2020-03-27: 4 mg via ORAL

## 2020-03-27 MED ORDER — ONDANSETRON 4 MG PO TBDP
ORAL_TABLET | ORAL | Status: AC
  Filled 2020-03-27: qty 1

## 2020-03-27 MED ORDER — ACETAMINOPHEN 325 MG RE SUPP
RECTAL | Status: AC
  Filled 2020-03-27: qty 2

## 2020-03-27 MED ORDER — ACETAMINOPHEN 325 MG PO TABS
650.0000 mg | ORAL_TABLET | Freq: Four times a day (QID) | ORAL | Status: DC | PRN
  Administered 2020-03-27: 650 mg via ORAL

## 2020-03-27 NOTE — ED Provider Notes (Addendum)
[facility]    CSN: [id number] Arrival date & time: 03/27/20  1204      History   Chief Complaint Chief Complaint  Patient presents with  . Diarrhea  . Vomiting    HPI Yesenia [name] is a 59 y.o. female.   Pt complains of nausea, vomiting and diarrhea.  Pt reports she has not had covid injections.  Pt denies any covid exposures.  Pt reports she vomitted up pink mucus   The history is provided by the patient. No language interpreter was used.    Past Medical History:  Diagnosis Date  . Abdominal pain, epigastric   . Abscess   . Acute constipation 02/16/2016  . Acute stomach ulcer 08/15/2017  . AKI (acute kidney injury) (HCC)    Acute kidney injury on CKD stage II-III/notes 08/17/2017  . Anemia   . Anemia   . Arthritis    "knees" (08/17/2017)  . Bleeding 06/23/2016  . Blood transfusion without reported diagnosis 2012   anemia  . Cerebral infarction (HCC) 01/14/2013  . Chest pain 02/15/2013  . Chronic pain   . CKD (chronic kidney disease)    Stage 3  . Depression   . Dysuria 12/06/2018  . Fibromyalgia    hospitilized 12/16 due to inability to walk  . Gastric ulcer   . Graves disease   . History of gastritis 2019  . History of tracheostomy 05/20/2016  . Hyperlipemia   . Hypertension   . Insomnia   . Iron deficiency anemia 05/26/2016  . Irritable bowel syndrome 05/21/2016  . Neuropathy   . Obstructive sleep apnea of adult 10/03/2015  . Right sided weakness   . Sleep apnea    "CPAPs didn't work for me; that's why they did OR" (08/17/2017)  . Stroke [facility]) 2014?   no residual weakness   . 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 Tremor 10/25/2017  . Type II diabetes mellitus (HCC)     does not check CBG's (08/17/2017), no meds  . Vitamin deficiency    Vit D    Patient Active Problem List   Diagnosis Date Noted  . Right leg numbness 02/13/2020  . Decreased hearing 02/13/2020  . Sleeping difficulty 09/13/2019  . Graves disease 12/13/2018  . Elevated  alkaline phosphatase level 12/13/2018  . Bilateral leg numbness 12/08/2018  . Healthcare maintenance 12/08/2018  . Anxiety 12/08/2018  . Chronic obstructive lung disease (HCC) 12/06/2018  . Hyperlipidemia 12/06/2018  . Neuropathy 12/06/2018  . Chronic right-sided low back pain without sciatica 12/06/2018  . Osteoarthritis of both knees 05/25/2018  . Hyperthyroidism 04/20/2018  . Dizziness 01/10/2018  . Hepatic steatosis 08/24/2017  . Vitamin D deficiency 08/23/2017  . Hyperlipidemia associated with type 2 diabetes mellitus (HCC) 08/23/2017  . Epigastric pain   . MDD (major depressive disorder), recurrent severe, without psychosis (HCC)   . Primary fibromyalgia syndrome 02/22/2016  . CKD (chronic kidney disease), stage III (HCC) 02/16/2016  . Morbid obesity with BMI of 50.0-59.9, adult (HCC) 01/19/2016  . Chronic pain 10/15/2015  . Circadian dysregulation 08/10/2015  . Polyneuropathy 07/10/2015  . Gastro-esophageal reflux disease without esophagitis 07/10/2015  . Abnormal gait 07/07/2015  . Injury of kidney 07/07/2015  . Type 2 diabetes mellitus with peripheral neuropathy (HCC) 04/11/2013  . Dyspnea 02/15/2013  . Cerebrovascular accident (HCC) 01/14/2013  . Hypertension associated with diabetes (HCC) 01/14/2013  . Anemia 01/14/2013    Past Surgical History:  Procedure Laterality Date  . ANTERIOR INTEROSSEOUS NERVE DECOMPRESSION Right 06/16/2018  Procedure: RIGHT ULNAR NEUROPLATY AT THE ELBOW AND WRIST;  Surgeon: Mack Hookhompson, David, MD;  [facility];  Service: Orthopedics;  Laterality: Right;  . ESOPHAGOGASTRODUODENOSCOPY (EGD) WITH PROPOFOL N/A 08/16/2017   Procedure: ESOPHAGOGASTRODUODENOSCOPY (EGD) WITH PROPOFOL;  Surgeon: Benancio DeedsArmbruster, Steven P, MD;  [facility];  Service: Gastroenterology;  Laterality: N/A;  . KNEE ARTHROPLASTY  1997   "stuck rods in it"  . KNEE ARTHROSCOPY Right 1992, 1995,  . OOPHORECTOMY Right 1993?  . TRACHEOSTOMY TUBE PLACEMENT N/A 02/13/2016     Procedure: TRACHEOSTOMY;  Surgeon: Christia Readingwight Bates, MD;  [facility];  Service: ENT;  Laterality: N/A;  . TUBAL LIGATION      OB History   No obstetric history on file.      Home Medications    Prior to Admission medications   Medication Sig Start Date End Date Taking? Authorizing Provider  amLODipine (NORVASC) 5 MG tablet Take 1 tablet (5 mg total) by mouth daily. 02/22/20  Yes Melene PlanKim, Rachel E, MD  atenolol (TENORMIN) 100 MG tablet TAKE ONE TABLET BY MOUTH EVERY DAY FOR HIGH BLOOD PRESSURE Patient taking differently: Take 100 mg by mouth daily.  06/12/19  Yes Melene PlanKim, Rachel E, MD  fluticasone furoate-vilanterol (BREO ELLIPTA) 100-25 MCG/INH AEPB Inhale 1 puff into the lungs daily. 08/08/19  Yes Parrett, Tammy S, NP  gabapentin (NEURONTIN) 300 MG capsule Take 3 capsules (900 mg total) by mouth at bedtime. 02/01/20  Yes Melene PlanKim, Rachel E, MD  hydrOXYzine (ATARAX/VISTARIL) 50 MG tablet Take 1.5 tablets (75 mg total) by mouth at bedtime. Patient taking differently: Take 50 mg by mouth at bedtime. Takes with 25 mg to get 75 mg 09/12/19  Yes Melene PlanKim, Rachel E, MD  traZODone (DESYREL) 50 MG tablet Take 1 tablet (50 mg total) by mouth at bedtime. 01/18/20  Yes Melene PlanKim, Rachel E, MD  albuterol (VENTOLIN HFA) 108 (90 Base) MCG/ACT inhaler Inhale 2 puffs into the lungs every 6 (six) hours as needed for wheezing or shortness of breath. 01/18/20   Melene PlanKim, Rachel E, MD  Blood Pressure Monitoring (BLOOD PRESSURE MON/AUTO/WRIST) DEVI 1 Units by Does not apply route daily. 08/23/19   Melene PlanKim, Rachel E, MD  cholestyramine Lanetta Inch(QUESTRAN) 4 g packet Use one 4 gm packed mixed into 4-6 oz of clear liquid daily. Do not take within 4 hours of any other medication 02/08/20   Arnaldo NatalKennedy-Smith, Colleen M, NP  Diaper Rash Products (DESITIN MULTI-PURPOSE HEALING) OINT Apply 1 application topically 2 (two) times daily. 07/27/19   Melene PlanKim, Rachel E, MD  dicyclomine (BENTYL) 10 MG capsule Take 1 capsule (10 mg total) by mouth 2 (two) times daily before a meal.  01/18/20   Melene PlanKim, Rachel E, MD  hydrOXYzine (ATARAX/VISTARIL) 25 MG tablet TAKE ONE TABLET BY MOUTH AT BEDTIME FOR ANXIETY WITH 50MG  OF HYDROXYZINE TO MAKE 75MG  Patient taking differently: Take 25 mg by mouth at bedtime. Takes with 50 mg to get 75 mg 11/14/19   Melene PlanKim, Rachel E, MD  methimazole (TAPAZOLE) 10 MG tablet Take 1.5 tablets (15 mg total) by mouth daily. 12/12/19   Melene PlanKim, Rachel E, MD  ondansetron (ZOFRAN-ODT) 8 MG disintegrating tablet Take 1 tablet (8 mg total) by mouth every 8 (eight) hours as needed for nausea or vomiting. 11/27/18   Armbruster, Willaim RayasSteven P, MD  pantoprazole (PROTONIX) 40 MG tablet Take 1 tablet (40 mg total) by mouth 2 (two) times daily. Patient taking differently: Take 40 mg by mouth daily as needed (acid reflux).  06/14/19   Melene PlanKim, Rachel E, MD  promethazine (PHENERGAN) 12.5 MG tablet Take 1 tablet (12.5 mg total) by mouth at bedtime as needed for nausea or vomiting. 10/11/19   Lennox Solders, MD  ramelteon (ROZEREM) 8 MG tablet Take 1 tablet (8 mg total) by mouth at bedtime as needed for sleep. 02/04/20 02/03/21  Melene Plan, MD  traZODone (DESYREL) 50 MG tablet Take 1 tablet (50 mg total) by mouth at bedtime. 08/17/19   Melene Plan, MD    Family History Family History  Problem Relation Age of Onset  . Breast cancer Mother   . Leukemia Brother   . Breast cancer Maternal Grandmother   . Colon cancer Neg Hx   . Esophageal cancer Neg Hx   . Rectal cancer Neg Hx   . Stomach cancer Neg Hx     Social History Social History   Tobacco Use  . Smoking status: Never Smoker  . Smokeless tobacco: Never Used  Vaping Use  . Vaping Use: Never used  Substance Use Topics  . Alcohol use: No    Alcohol/week: 0.0 standard drinks  . Drug use: No     Allergies   Buprenorphine hcl and Morphine and related   Review of Systems Review of Systems  Gastrointestinal: Positive for diarrhea, nausea and vomiting.  Skin: Negative for color change.  All other systems reviewed and are  negative.    Physical Exam Triage Vital Signs ED Triage Vitals  Enc Vitals Group     BP 03/27/20 1345 100/84     Pulse Rate 03/27/20 1209 88     Resp 03/27/20 1345 (!) 22     Temp 03/27/20 1345 98 F (36.7 C)     Temp Source 03/27/20 1345 Oral     SpO2 03/27/20 1209 94 %     Weight --      Height --      Head Circumference --      Peak Flow --      Pain Score 03/27/20 1340 9     Pain Loc --      Pain Edu? --      Excl. in GC? --    No data found.  Updated Vital Signs BP 100/84 (BP Location: Left Arm)   Pulse 73   Temp 98 F (36.7 C) (Oral)   Resp (!) 22   SpO2 97%   Visual Acuity Right Eye Distance:   Left Eye Distance:   Bilateral Distance:    Right Eye Near:   Left Eye Near:    Bilateral Near:     Physical Exam Vitals and nursing note reviewed.  Constitutional:      Appearance: She is well-developed.  HENT:     Head: Normocephalic.  Cardiovascular:     Rate and Rhythm: Normal rate and regular rhythm.  Pulmonary:     Effort: Pulmonary effort is normal.  Abdominal:     General: There is no distension.  Musculoskeletal:        General: Normal range of motion.     Cervical back: Normal range of motion.  Skin:    General: Skin is warm.  Neurological:     General: No focal deficit present.     Mental Status: She is alert and oriented to person, place, and time.  Psychiatric:        Mood and Affect: Mood normal.      UC Treatments / Results  Labs (all labs ordered are listed, but only abnormal results are displayed) Labs Reviewed  BASIC METABOLIC PANEL - Abnormal; Notable for the following components:      Result Value   Glucose, Bld 189 (*)    Creatinine, Ser 1.68 (*)    GFR calc non Af Amer 33 (*)    GFR calc Af Amer 38 (*)    All other components within normal limits  SARS CORONAVIRUS 2 (TAT 6-24 HRS)  CBC WITH DIFFERENTIAL/PLATELET    EKG   Radiology No results found.  Procedures Procedures (including critical care  time)  Medications Ordered in UC Medications  acetaminophen (TYLENOL) tablet 650 mg (650 mg Oral Given 03/27/20 1620)  ondansetron (ZOFRAN-ODT) disintegrating tablet 4 mg (4 mg Oral Given 03/27/20 1620)    Initial Impression / Assessment and Plan / UC Course  I have reviewed the triage vital signs and the nursing notes.  Pertinent labs & imaging results that were available during my care of the patient were reviewed by me and considered in my medical decision making (see chart for details).     MDM:  Labs reviewed,  Pt counseled on possible covid  If positive pt may benefit from MAB treatment  Final Clinical Impressions(s) / UC Diagnoses   Final diagnoses:  Nausea vomiting and diarrhea     Discharge Instructions     Follow up with your Physician for recheck at family practice.  Your covid test is pending. It should return in 6-24 hours    ED Prescriptions    None     PDMP not reviewed this encounter.  An After Visit Summary was printed and given to the patient.    Elson Areas, PA-C 03/27/20 1720    Elson Areas, PA-C 03/27/20 1831

## 2020-03-27 NOTE — ED Triage Notes (Signed)
Diarrhea and vomiting since Monday.  3 diarrhea stools today.  2 episodes of vomiting today.  Patient feels light headed.  Chest feels heavy with respirations and reports coughing up blood.

## 2020-03-27 NOTE — Discharge Instructions (Addendum)
Follow up with your Physician for recheck at family practice.  Your covid test is pending. It should return in 6-24 hours

## 2020-03-27 NOTE — ED Notes (Signed)
Patient is poor historian.  Patient keeps falling asleep during in-take.  Respirations regular and unlabored.

## 2020-03-31 ENCOUNTER — Other Ambulatory Visit: Payer: Medicaid Other

## 2020-04-01 ENCOUNTER — Other Ambulatory Visit: Payer: Self-pay | Admitting: Family Medicine

## 2020-04-03 ENCOUNTER — Ambulatory Visit: Payer: Medicaid Other | Admitting: Audiologist

## 2020-04-08 LAB — PULMONARY FUNCTION TEST
DL/VA % pred: 99 %
DL/VA: 4.14 ml/min/mmHg/L
DLCO unc % pred: 59 %
DLCO unc: 13.35 ml/min/mmHg
FEF 25-75 Post: 2.24 L/sec
FEF 25-75 Pre: 1.98 L/sec
FEF2575-%Change-Post: 13 %
FEF2575-%Pred-Post: 93 %
FEF2575-%Pred-Pre: 82 %
FEV1-%Change-Post: 3 %
FEV1-%Pred-Post: 78 %
FEV1-%Pred-Pre: 75 %
FEV1-Post: 1.91 L
FEV1-Pre: 1.85 L
FEV1FVC-%Change-Post: 16 %
FEV1FVC-%Pred-Pre: 102 %
FEV6-%Change-Post: -11 %
FEV6-%Pred-Post: 67 %
FEV6-%Pred-Pre: 75 %
FEV6-Post: 2.01 L
FEV6-Pre: 2.26 L
FEV6FVC-%Pred-Post: 103 %
FEV6FVC-%Pred-Pre: 103 %
FVC-%Change-Post: -11 %
FVC-%Pred-Post: 65 %
FVC-%Pred-Pre: 73 %
FVC-Post: 2.01 L
FVC-Pre: 2.26 L
Post FEV1/FVC ratio: 95 %
Post FEV6/FVC ratio: 100 %
Pre FEV1/FVC ratio: 82 %
Pre FEV6/FVC Ratio: 100 %
RV % pred: 27 %
RV: 0.58 L
TLC % pred: 50 %
TLC: 2.8 L

## 2020-04-09 ENCOUNTER — Ambulatory Visit: Payer: Medicaid Other | Admitting: Family Medicine

## 2020-04-09 ENCOUNTER — Telehealth: Payer: Self-pay | Admitting: General Surgery

## 2020-04-09 MED ORDER — CHOLESTYRAMINE 4 G PO PACK: PACK | ORAL | 2 refills | Status: DC

## 2020-04-09 NOTE — Telephone Encounter (Signed)
Yesenia [name] pharmacy sent a fax request for Cholestyramine 4gm packets. Is this something you would like the patient to continue. Her last rx was written 10/09/2019 #60 with 1 refill.

## 2020-04-09 NOTE — Telephone Encounter (Signed)
Refilled Cholestyramine 4gm packet #60 2 refil

## 2020-04-09 NOTE — Telephone Encounter (Signed)
Yesenia [name], ok to refill Cholestyramine as previously ordered with 2 additional refills. Thx

## 2020-04-21 ENCOUNTER — Other Ambulatory Visit: Payer: Self-pay | Admitting: Family Medicine

## 2020-04-23 ENCOUNTER — Other Ambulatory Visit: Payer: Self-pay | Admitting: Family Medicine

## 2020-04-30 ENCOUNTER — Other Ambulatory Visit: Payer: Self-pay

## 2020-04-30 ENCOUNTER — Encounter (HOSPITAL_COMMUNITY): Payer: Self-pay | Admitting: Emergency Medicine

## 2020-04-30 ENCOUNTER — Emergency Department (HOSPITAL_COMMUNITY): Payer: Medicaid Other

## 2020-04-30 ENCOUNTER — Telehealth: Payer: Self-pay

## 2020-04-30 ENCOUNTER — Inpatient Hospital Stay (HOSPITAL_COMMUNITY)
Admission: EM | Admit: 2020-04-30 | Discharge: 2020-05-02 | DRG: 206 | Disposition: A | Discharge status: Home / Self Care | Payer: Medicaid Other | Attending: Family Medicine | Admitting: Family Medicine

## 2020-04-30 DIAGNOSIS — Z20822 Contact with and (suspected) exposure to covid-19: Secondary | ICD-10-CM | POA: Diagnosis present

## 2020-04-30 DIAGNOSIS — Z8711 Personal history of peptic ulcer disease: Secondary | ICD-10-CM

## 2020-04-30 DIAGNOSIS — M545 Low back pain, unspecified: Secondary | ICD-10-CM | POA: Diagnosis present

## 2020-04-30 DIAGNOSIS — E0590 Thyrotoxicosis, unspecified without thyrotoxic crisis or storm: Secondary | ICD-10-CM | POA: Diagnosis present

## 2020-04-30 DIAGNOSIS — Z8673 Personal history of transient ischemic attack (TIA), and cerebral infarction without residual deficits: Secondary | ICD-10-CM

## 2020-04-30 DIAGNOSIS — E1169 Type 2 diabetes mellitus with other specified complication: Secondary | ICD-10-CM | POA: Diagnosis present

## 2020-04-30 DIAGNOSIS — E1122 Type 2 diabetes mellitus with diabetic chronic kidney disease: Secondary | ICD-10-CM | POA: Diagnosis present

## 2020-04-30 DIAGNOSIS — R079 Chest pain, unspecified: Secondary | ICD-10-CM | POA: Diagnosis present

## 2020-04-30 DIAGNOSIS — Z803 Family history of malignant neoplasm of breast: Secondary | ICD-10-CM

## 2020-04-30 DIAGNOSIS — Z6841 Body Mass Index (BMI) 40.0 and over, adult: Secondary | ICD-10-CM

## 2020-04-30 DIAGNOSIS — I129 Hypertensive chronic kidney disease with stage 1 through stage 4 chronic kidney disease, or unspecified chronic kidney disease: Secondary | ICD-10-CM | POA: Diagnosis present

## 2020-04-30 DIAGNOSIS — D649 Anemia, unspecified: Secondary | ICD-10-CM | POA: Diagnosis present

## 2020-04-30 DIAGNOSIS — E0500 Thyrotoxicosis with diffuse goiter without thyrotoxic crisis or storm: Secondary | ICD-10-CM | POA: Diagnosis present

## 2020-04-30 DIAGNOSIS — E1159 Type 2 diabetes mellitus with other circulatory complications: Secondary | ICD-10-CM | POA: Diagnosis present

## 2020-04-30 DIAGNOSIS — F332 Major depressive disorder, recurrent severe without psychotic features: Secondary | ICD-10-CM | POA: Diagnosis present

## 2020-04-30 DIAGNOSIS — E785 Hyperlipidemia, unspecified: Secondary | ICD-10-CM | POA: Diagnosis present

## 2020-04-30 DIAGNOSIS — M940 Chondrocostal junction syndrome [Tietze]: Principal | ICD-10-CM | POA: Diagnosis present

## 2020-04-30 DIAGNOSIS — J449 Chronic obstructive pulmonary disease, unspecified: Secondary | ICD-10-CM | POA: Diagnosis present

## 2020-04-30 DIAGNOSIS — Z8249 Family history of ischemic heart disease and other diseases of the circulatory system: Secondary | ICD-10-CM

## 2020-04-30 DIAGNOSIS — N182 Chronic kidney disease, stage 2 (mild): Secondary | ICD-10-CM | POA: Diagnosis present

## 2020-04-30 DIAGNOSIS — R072 Precordial pain: Secondary | ICD-10-CM | POA: Diagnosis present

## 2020-04-30 DIAGNOSIS — Z806 Family history of leukemia: Secondary | ICD-10-CM

## 2020-04-30 DIAGNOSIS — I152 Hypertension secondary to endocrine disorders: Secondary | ICD-10-CM | POA: Diagnosis present

## 2020-04-30 DIAGNOSIS — G8929 Other chronic pain: Secondary | ICD-10-CM | POA: Diagnosis present

## 2020-04-30 DIAGNOSIS — I639 Cerebral infarction, unspecified: Secondary | ICD-10-CM | POA: Diagnosis present

## 2020-04-30 DIAGNOSIS — R0602 Shortness of breath: Secondary | ICD-10-CM

## 2020-04-30 DIAGNOSIS — K219 Gastro-esophageal reflux disease without esophagitis: Secondary | ICD-10-CM | POA: Diagnosis present

## 2020-04-30 DIAGNOSIS — N183 Chronic kidney disease, stage 3 unspecified: Secondary | ICD-10-CM | POA: Diagnosis present

## 2020-04-30 DIAGNOSIS — E1142 Type 2 diabetes mellitus with diabetic polyneuropathy: Secondary | ICD-10-CM | POA: Diagnosis present

## 2020-04-30 DIAGNOSIS — K589 Irritable bowel syndrome without diarrhea: Secondary | ICD-10-CM | POA: Diagnosis present

## 2020-04-30 LAB — COMPREHENSIVE METABOLIC PANEL
ALT: 24 U/L (ref 0–44)
AST: 25 U/L (ref 15–41)
Albumin: 3.4 g/dL — ABNORMAL LOW (ref 3.5–5.0)
Alkaline Phosphatase: 81 U/L (ref 38–126)
Anion gap: 12 (ref 5–15)
BUN: 12 mg/dL (ref 6–20)
CO2: 22 mmol/L (ref 22–32)
Calcium: 8.6 mg/dL — ABNORMAL LOW (ref 8.9–10.3)
Chloride: 105 mmol/L (ref 98–111)
Creatinine, Ser: 1.04 mg/dL — ABNORMAL HIGH (ref 0.44–1.00)
GFR, Estimated: 59 mL/min — ABNORMAL LOW (ref >60)
Glucose, Bld: 153 mg/dL — ABNORMAL HIGH (ref 70–99)
Potassium: 4 mmol/L (ref 3.5–5.1)
Sodium: 139 mmol/L (ref 135–145)
Total Bilirubin: 0.4 mg/dL (ref 0.3–1.2)
Total Protein: 6.7 g/dL (ref 6.5–8.1)

## 2020-04-30 LAB — CBC WITH DIFFERENTIAL/PLATELET
Abs Immature Granulocytes: 0.04 10*3/uL (ref 0.00–0.07)
Basophils Absolute: 0.1 10*3/uL (ref 0.0–0.1)
Basophils Relative: 1 %
Eosinophils Absolute: 0.2 10*3/uL (ref 0.0–0.5)
Eosinophils Relative: 3 %
HCT: 37.8 % (ref 36.0–46.0)
Hemoglobin: 11.6 g/dL — ABNORMAL LOW (ref 12.0–15.0)
Immature Granulocytes: 1 %
Lymphocytes Relative: 34 %
Lymphs Abs: 2.7 10*3/uL (ref 0.7–4.0)
MCH: 27.4 pg (ref 26.0–34.0)
MCHC: 30.7 g/dL (ref 30.0–36.0)
MCV: 89.4 fL (ref 80.0–100.0)
Monocytes Absolute: 0.6 10*3/uL (ref 0.1–1.0)
Monocytes Relative: 8 %
Neutro Abs: 4.4 10*3/uL (ref 1.7–7.7)
Neutrophils Relative %: 53 %
Platelets: 337 10*3/uL (ref 150–400)
RBC: 4.23 MIL/uL (ref 3.87–5.11)
RDW: 14.8 % (ref 11.5–15.5)
WBC: 8.1 10*3/uL (ref 4.0–10.5)
nRBC: 0 % (ref 0.0–0.2)

## 2020-04-30 LAB — RESPIRATORY PANEL BY RT PCR (FLU A&B, COVID)
Influenza A by PCR: NEGATIVE
Influenza B by PCR: NEGATIVE
SARS Coronavirus 2 by RT PCR: NEGATIVE

## 2020-04-30 LAB — TSH: TSH: 1.287 u[IU]/mL (ref 0.350–4.500)

## 2020-04-30 LAB — HIV ANTIBODY (ROUTINE TESTING W REFLEX): HIV Screen 4th Generation wRfx: NONREACTIVE

## 2020-04-30 LAB — TROPONIN I (HIGH SENSITIVITY)
Troponin I (High Sensitivity): 3 ng/L (ref <18)
Troponin I (High Sensitivity): 4 ng/L (ref <18)
Troponin I (High Sensitivity): 3 ng/L (ref <18)

## 2020-04-30 LAB — GLUCOSE, CAPILLARY: Glucose-Capillary: 194 mg/dL — ABNORMAL HIGH (ref 70–99)

## 2020-04-30 MED ORDER — GABAPENTIN 300 MG PO CAPS
900.0000 mg | ORAL_CAPSULE | Freq: Every day | ORAL | Status: DC
  Administered 2020-04-30 – 2020-05-01 (×2): 900 mg via ORAL
  Filled 2020-04-30 (×2): qty 3

## 2020-04-30 MED ORDER — ALBUTEROL SULFATE HFA 108 (90 BASE) MCG/ACT IN AERS
2.0000 | INHALATION_SPRAY | Freq: Once | RESPIRATORY_TRACT | Status: AC
  Administered 2020-04-30: 2 via RESPIRATORY_TRACT
  Filled 2020-04-30: qty 6.7

## 2020-04-30 MED ORDER — HYDROXYZINE HCL 25 MG PO TABS
75.0000 mg | ORAL_TABLET | Freq: Every evening | ORAL | Status: DC | PRN
  Administered 2020-04-30: 75 mg via ORAL
  Filled 2020-04-30: qty 3

## 2020-04-30 MED ORDER — METHIMAZOLE 5 MG PO TABS
15.0000 mg | ORAL_TABLET | Freq: Every day | ORAL | Status: DC
  Administered 2020-05-01 – 2020-05-02 (×2): 15 mg via ORAL
  Filled 2020-04-30 (×2): qty 1

## 2020-04-30 MED ORDER — FLUTICASONE FUROATE-VILANTEROL 100-25 MCG/INH IN AEPB
1.0000 | INHALATION_SPRAY | Freq: Every day | RESPIRATORY_TRACT | Status: DC
  Administered 2020-05-01 – 2020-05-02 (×2): 1 via RESPIRATORY_TRACT
  Filled 2020-04-30: qty 28

## 2020-04-30 MED ORDER — CHOLESTYRAMINE 4 G PO PACK
4.0000 g | PACK | Freq: Every day | ORAL | Status: DC
  Administered 2020-05-01 – 2020-05-02 (×2): 4 g via ORAL
  Filled 2020-04-30 (×2): qty 1

## 2020-04-30 MED ORDER — HYDROXYZINE HCL 25 MG PO TABS: 75.0000 mg | ORAL_TABLET | Freq: Every day | ORAL | Status: DC

## 2020-04-30 MED ORDER — ALBUTEROL SULFATE HFA 108 (90 BASE) MCG/ACT IN AERS
2.0000 | INHALATION_SPRAY | Freq: Four times a day (QID) | RESPIRATORY_TRACT | Status: DC | PRN
  Filled 2020-04-30: qty 6.7

## 2020-04-30 MED ORDER — INSULIN ASPART 100 UNIT/ML ~~LOC~~ SOLN: 0.0000 [IU] | Freq: Every day | SUBCUTANEOUS | Status: DC

## 2020-04-30 MED ORDER — ACETAMINOPHEN 325 MG PO TABS
650.0000 mg | ORAL_TABLET | ORAL | Status: DC | PRN
  Administered 2020-05-01 – 2020-05-02 (×2): 650 mg via ORAL
  Filled 2020-04-30 (×2): qty 2

## 2020-04-30 MED ORDER — DICYCLOMINE HCL 10 MG PO CAPS
10.0000 mg | ORAL_CAPSULE | Freq: Two times a day (BID) | ORAL | Status: DC
  Administered 2020-05-01 – 2020-05-02 (×3): 10 mg via ORAL
  Filled 2020-04-30 (×4): qty 1

## 2020-04-30 MED ORDER — DICLOFENAC SODIUM 1 % EX GEL
2.0000 g | Freq: Four times a day (QID) | CUTANEOUS | Status: DC
  Administered 2020-04-30 – 2020-05-02 (×7): 2 g via TOPICAL
  Filled 2020-04-30: qty 100

## 2020-04-30 MED ORDER — ASPIRIN 81 MG PO CHEW
324.0000 mg | CHEWABLE_TABLET | Freq: Once | ORAL | Status: AC
  Administered 2020-04-30: 324 mg via ORAL
  Filled 2020-04-30: qty 4

## 2020-04-30 MED ORDER — ONDANSETRON 4 MG PO TBDP: 8.0000 mg | ORAL_TABLET | Freq: Three times a day (TID) | ORAL | Status: DC | PRN

## 2020-04-30 MED ORDER — ENOXAPARIN SODIUM 80 MG/0.8ML ~~LOC~~ SOLN
75.0000 mg | SUBCUTANEOUS | Status: DC
  Administered 2020-04-30 – 2020-05-01 (×2): 75 mg via SUBCUTANEOUS
  Filled 2020-04-30: qty 0.75
  Filled 2020-04-30 (×2): qty 0.8

## 2020-04-30 MED ORDER — AMLODIPINE BESYLATE 5 MG PO TABS
5.0000 mg | ORAL_TABLET | Freq: Every day | ORAL | Status: DC
  Administered 2020-05-01 – 2020-05-02 (×2): 5 mg via ORAL
  Filled 2020-04-30 (×2): qty 1

## 2020-04-30 MED ORDER — DESITIN MULTI-PURPOSE HEALING EX OINT: 1.0000 "application " | TOPICAL_OINTMENT | Freq: Two times a day (BID) | CUTANEOUS | Status: DC

## 2020-04-30 MED ORDER — ATENOLOL 25 MG PO TABS
100.0000 mg | ORAL_TABLET | Freq: Every day | ORAL | Status: DC
  Administered 2020-05-01 – 2020-05-02 (×2): 100 mg via ORAL
  Filled 2020-04-30 (×2): qty 4

## 2020-04-30 MED ORDER — INSULIN ASPART 100 UNIT/ML ~~LOC~~ SOLN
0.0000 [IU] | Freq: Three times a day (TID) | SUBCUTANEOUS | Status: DC
  Administered 2020-05-01: 3 [IU] via SUBCUTANEOUS
  Administered 2020-05-01 (×2): 1 [IU] via SUBCUTANEOUS

## 2020-04-30 MED ORDER — NITROGLYCERIN 0.4 MG SL SUBL
0.4000 mg | SUBLINGUAL_TABLET | SUBLINGUAL | Status: DC | PRN
  Administered 2020-04-30: 0.4 mg via SUBLINGUAL
  Filled 2020-04-30: qty 1

## 2020-04-30 MED ORDER — TRAZODONE HCL 50 MG PO TABS
50.0000 mg | ORAL_TABLET | Freq: Every day | ORAL | Status: DC
  Administered 2020-04-30 – 2020-05-01 (×2): 50 mg via ORAL
  Filled 2020-04-30 (×2): qty 1

## 2020-04-30 MED ORDER — IOHEXOL 350 MG/ML SOLN
100.0000 mL | Freq: Once | INTRAVENOUS | Status: AC | PRN
  Administered 2020-04-30: 100 mL via INTRAVENOUS

## 2020-04-30 MED ORDER — PANTOPRAZOLE SODIUM 40 MG PO TBEC
40.0000 mg | DELAYED_RELEASE_TABLET | Freq: Two times a day (BID) | ORAL | Status: DC
  Administered 2020-04-30 – 2020-05-02 (×4): 40 mg via ORAL
  Filled 2020-04-30 (×4): qty 1

## 2020-04-30 NOTE — ED Provider Notes (Signed)
4:25 PM-checkout from Dr. Bobby Rumpf to evaluate patient after return of CT imaging of the neck, then make a decision about discharge or hospitalized, for ongoing chest pain.  4:45 PM-all imaging returned and no acute abnormalities.  See reports by radiology.  She has had 2 troponins, approximately 90 minutes apart which are normal.  CBC and c-Met normal except elevation of glucose, creatinine and low calcium and albumin.  I reviewed the EKG which shows nonspecific T wave changes, similar to prior.  5:20 PM-patient relates to me that the chest pain which was earlier 8 over time, and persistent all day long, got better after single nitroglycerin and is now 5/10.  She also reports intermittent heavy feeling in her chest similar to the pain today that has been present usually twice a week for 2 months.  Also over the last 3 days she is coughed up thick green mucus twice, and once had blood with it.  She is not a smoker.  She has not had cardiac history.  She does not know her cholesterol level.  There is no family history of early MI in her immediate family members.  She has never seen a cardiologist.  The patient would prefer to stay here "to see what is wrong."  Medical decision making-nonspecific chest pain, which is at worst atypical for coronary disease but most likely is noncardiac type chest pain.  Cardiac risk factors are negative with the exception of unknown cholesterol level which would be positive.  Patient is very obese and will be difficult to do a exercise type stress test.   Plan-contact hospitalist for admission for risk stratification procedure and  management of ongoing chest pain.  6:14 PM-hospitalist agrees to place patient in observation for further evaluation.   Daleen Bo, MD 04/30/20 [phone]

## 2020-04-30 NOTE — Telephone Encounter (Signed)
Agree with ER evaluation.  [name], MD  Family Medicine Teaching Service

## 2020-04-30 NOTE — ED Triage Notes (Signed)
Pt arrives via [facility] from home with c/o chest pressure, sob and cough since Thursday. Today she endorses nausea and worsening chest pain. Pt denies dizziness but states she feels "off." denies any recent known covid exposures. Pt also endorses some edema to upper and lower R arm and leg. EMS VSS 97% on room air, placed on 2L O2 for comfort. Ems 12 lead unremarkable. Pt a/ox4, resp e/u.

## 2020-04-30 NOTE — H&P (Addendum)
History and Physical   LABELLA ZAHRADNIK ZOX:[id number] DOB: 04-09-1961 DOA: 04/30/2020  PCP: Melene Plan, MD   Patient coming from: Home   Chief Complaint: Chest Pain  HPI: Yesenia [name] is a 59 y.o. female with medical history significant of hypertension, CVA, anemia, diabetes, hyperlipidemia, GERD, CKD 3, major depressive disorder, hyperthyroidism/Graves' disease, obstructive pulmonary disease, and obesity presents with chest pain.   She states the pain has been coming and going for about 2 weeks.  It sometimes lasts for a day and sometimes last for only an hour or so.  The pain is not associated with eating, activity, or rest.  She states that because the pain did not last initially, she has not tried anything for the pain at home.  The pain is described as an 8 out of 10 pressure-like pain that at times feels like it is radiating to her back.  She had some relief to 5 out of 10 in the ED after receiving nitroglycerin and aspirin.  She came in today because her pain became constant starting this morning.  She reports also some some worsening of pain when she takes a deep breath, which causes her to experience a degree of dyspnea as she has some difficulty catching her breath due to the pain.  She reports IBS with alternating constipation and diarrhea.  She also reports chronic intermittently productive cough. She denies fevers, abdominal pain, nausea, or diaphoresis.  ED Course: Vital signs stable in ED. lab work-up showed creatinine of 1.04 consistent with known CKD, calcium 8.6 which corrects with albumin of 3.4, mild anemia of 11.6 which is chronic, negative troponin x2, negative respiratory panel.  Chest x-ray showed no acute disease.  CT neck and CT PE were without acute abnormalities.  She received nitroglycerin with some improvement in her chest pain.  She additionally received aspirin and albuterol.  She continued to experience chest pain and was concerned about going home and she is being  admitted for observation and chest pain rule out.  Review of Systems: As per HPI otherwise all other systems reviewed and are negative.  Past Medical History:  Diagnosis Date  . Abdominal pain, epigastric   . Abscess   . Acute constipation 02/16/2016  . Acute stomach ulcer 08/15/2017  . AKI (acute kidney injury) (HCC)    Acute kidney injury on CKD stage II-III/notes 08/17/2017  . Anemia   . Anemia   . Arthritis    "knees" (08/17/2017)  . Bleeding 06/23/2016  . Blood transfusion without reported diagnosis 2012   anemia  . Cerebral infarction (HCC) 01/14/2013  . Chest pain 02/15/2013  . Chronic pain   . CKD (chronic kidney disease)    Stage 3  . Depression   . Dysuria 12/06/2018  . Fibromyalgia    hospitilized 12/16 due to inability to walk  . Gastric ulcer   . Graves disease   . History of gastritis 2019  . History of tracheostomy 05/20/2016  . Hyperlipemia   . Hypertension   . Insomnia   . Iron deficiency anemia 05/26/2016  . Irritable bowel syndrome 05/21/2016  . Neuropathy   . Obstructive sleep apnea of adult 10/03/2015  . Right sided weakness   . Sleep apnea    "CPAPs didn't work for me; that's why they did OR" (08/17/2017)  . Stroke [facility]) 2014?   no residual weakness   . 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 Tremor 10/25/2017  . Type II diabetes mellitus (HCC)  does not check CBG's (08/17/2017), no meds  . Vitamin deficiency    Vit D    Past Surgical History:  Procedure Laterality Date  . ANTERIOR INTEROSSEOUS NERVE DECOMPRESSION Right 06/16/2018   Procedure: RIGHT ULNAR NEUROPLATY AT THE ELBOW AND WRIST;  Surgeon: Mack Hook, MD;  Location: [facility] OR;  Service: Orthopedics;  Laterality: Right;  . ESOPHAGOGASTRODUODENOSCOPY (EGD) WITH PROPOFOL N/A 08/16/2017   Procedure: ESOPHAGOGASTRODUODENOSCOPY (EGD) WITH PROPOFOL;  Surgeon: Benancio Deeds, MD;  [facility] END[facility]COPY;  Service: Gastroenterology;  Laterality: N/A;  . KNEE ARTHROPLASTY  1997   "stuck rods in  it"  . KNEE ARTHROSCOPY Right 1992, 1995,  . OOPHORECTOMY Right 1993?  . TRACHEOSTOMY TUBE PLACEMENT N/A 02/13/2016   Procedure: TRACHEOSTOMY;  Surgeon: Christia Reading, MD;  Location: [facility] OR;  Service: ENT;  Laterality: N/A;  . TUBAL LIGATION      Social History  reports that she has never smoked. She has never used smokeless tobacco. She reports that she does not drink alcohol and does not use drugs.  Allergies  Allergen Reactions  . Buprenorphine Hcl Hives  . Morphine And Related Hives and Dermatitis    Family History  Problem Relation Age of Onset  . Breast cancer Mother   . Leukemia Brother   . Breast cancer Maternal Grandmother   . Colon cancer Neg Hx   . Esophageal cancer Neg Hx   . Rectal cancer Neg Hx   . Stomach cancer Neg Hx   Reviewed on admission  Prior to Admission medications   Medication Sig Start Date End Date Taking? Authorizing Provider  albuterol (VENTOLIN HFA) 108 (90 Base) MCG/ACT inhaler Inhale 2 puffs into the lungs every 6 (six) hours as needed for wheezing or shortness of breath. 01/18/20   Melene Plan, MD  amLODipine (NORVASC) 5 MG tablet Take 1 tablet (5 mg total) by mouth daily. 02/22/20   Melene Plan, MD  atenolol (TENORMIN) 100 MG tablet TAKE ONE TABLET BY MOUTH EVERY DAY FOR HIGH BLOOD PRESSURE Patient taking differently: Take 100 mg by mouth daily.  06/12/19   Melene Plan, MD  Blood Pressure Monitoring (BLOOD PRESSURE MON/AUTO/WRIST) DEVI 1 Units by Does not apply route daily. 08/23/19   Melene Plan, MD  cholestyramine Lanetta Inch) 4 g packet Use one 4 gm packed mixed into 4-6 oz of clear liquid daily. Do not take within 4 hours of any other medication 04/09/20   Arnaldo Natal, NP  Diaper Rash Products (DESITIN MULTI-PURPOSE HEALING) OINT Apply 1 application topically 2 (two) times daily. 07/27/19   Melene Plan, MD  dicyclomine (BENTYL) 10 MG capsule Take 1 capsule (10 mg total) by mouth 2 (two) times daily before a meal. 01/18/20   Melene Plan, MD  fluticasone furoate-vilanterol (BREO ELLIPTA) 100-25 MCG/INH AEPB Inhale 1 puff into the lungs daily. 08/08/19   Parrett, Virgel Bouquet, NP  gabapentin (NEURONTIN) 300 MG capsule Take 3 capsules (900 mg total) by mouth at bedtime. 04/24/20   Melene Plan, MD  hydrOXYzine (ATARAX/VISTARIL) 25 MG tablet TAKE ONE TABLET BY MOUTH AT BEDTIME FOR ANXIETY WITH  OF HYDROXYZINE TO MAKE  04/21/20   Melene Plan, MD  hydrOXYzine (ATARAX/VISTARIL) 50 MG tablet Take 1.5 tablets (75 mg total) by mouth at bedtime. Patient taking differently: Take 50 mg by mouth at bedtime. Takes with 25 mg to get 75 mg 09/12/19   Melene Plan, MD  methimazole (TAPAZOLE) 10 MG tablet Take 1.5 tablets (15  mg total) by mouth daily. 04/01/20   Melene Plan, MD  ondansetron (ZOFRAN-ODT) 8 MG disintegrating tablet Take 1 tablet (8 mg total) by mouth every 8 (eight) hours as needed for nausea or vomiting. 11/27/18   Armbruster, Willaim Rayas, MD  pantoprazole (PROTONIX) 40 MG tablet Take 1 tablet (40 mg total) by mouth 2 (two) times daily. Patient taking differently: Take 40 mg by mouth daily as needed (acid reflux).  06/14/19   Melene Plan, MD  promethazine (PHENERGAN) 12.5 MG tablet Take 1 tablet (12.5 mg total) by mouth at bedtime as needed for nausea or vomiting. 10/11/19   Lennox Solders, MD  ramelteon (ROZEREM) 8 MG tablet Take 1 tablet (8 mg total) by mouth at bedtime as needed for sleep. 02/04/20 02/03/21  Melene Plan, MD  traZODone (DESYREL) 50 MG tablet Take 1 tablet (50 mg total) by mouth at bedtime. 03/28/20   Melene Plan, MD  traZODone (DESYREL) 50 MG tablet Take 1 tablet (50 mg total) by mouth at bedtime. 08/17/19   Melene Plan, MD    Physical Exam: Vitals:   04/30/20 1730 04/30/20 1745 04/30/20 1800 04/30/20 1830  BP: 135/85 129/86 119/80 (!) 127/92  Pulse: 72 70 75 77  Resp: 12 16 14 16   Temp:      TempSrc:      SpO2: 96% 96% 96% 98%  Weight:      Height:        Physical Exam Constitutional:       General: She is not in acute distress.    Appearance: Normal appearance.     Comments: Obese Female  HENT:     Head: Normocephalic and atraumatic.     Mouth/Throat:     Mouth: Mucous membranes are moist.     Pharynx: Oropharynx is clear.  Eyes:     Extraocular Movements: Extraocular movements intact.     Pupils: Pupils are equal, round, and reactive to light.  Cardiovascular:     Rate and Rhythm: Normal rate and regular rhythm.     Pulses: Normal pulses.     Heart sounds: Normal heart sounds.     Comments: Tender to palpation of left parasternal chest wall Pulmonary:     Effort: Pulmonary effort is normal. No respiratory distress.     Breath sounds: Normal breath sounds.  Abdominal:     General: Bowel sounds are normal. There is no distension.     Palpations: Abdomen is soft.     Tenderness: There is no abdominal tenderness.  Musculoskeletal:        General: No swelling or deformity.     Comments: Tender to palpation of left parasternal chest wall  Skin:    General: Skin is warm and dry.  Neurological:     General: No focal deficit present.     Mental Status: Mental status is at baseline.    Labs on Admission: I have personally reviewed following labs and imaging studies  CBC: Recent Labs  Lab 04/30/20 1106  WBC 8.1  NEUTROABS 4.4  HGB 11.6*  HCT 37.8  MCV 89.4  PLT 337    Basic Metabolic Panel: Recent Labs  Lab 04/30/20 1106  NA 139  K 4.0  CL 105  CO2 22  GLUCOSE 153*  BUN 12  CREATININE 1.04*  CALCIUM 8.6*    GFR: Estimated Creatinine Clearance: 90.6 mL/min (A) (by C-G formula based on SCr of 1.04 mg/dL (H)).  Liver Function Tests: Recent Labs  Lab 04/30/20 1106  AST 25  ALT 24  ALKPHOS 81  BILITOT 0.4  PROT 6.7  ALBUMIN 3.4*    Urine analysis:    Component Value Date/Time   COLORURINE AMBER (A) 08/21/2019 2118   APPEARANCEUR CLOUDY (A) 08/21/2019 2118   LABSPEC 1.024 08/21/2019 2118   PHURINE 5.0 08/21/2019 2118   GLUCOSEU  NEGATIVE 08/21/2019 2118   HGBUR SMALL (A) 08/21/2019 2118   BILIRUBINUR negative 10/11/2019 1550   KETONESUR negative 10/11/2019 1550   KETONESUR NEGATIVE 08/21/2019 2118   PROTEINUR negative 10/11/2019 1550   PROTEINUR 100 (A) 08/21/2019 2118   UROBILINOGEN 0.2 10/11/2019 1550   UROBILINOGEN 0.2 06/16/2014 0955   NITRITE Negative 10/11/2019 1550   NITRITE POSITIVE (A) 08/21/2019 2118   LEUKOCYTESUR Negative 10/11/2019 1550   LEUKOCYTESUR MODERATE (A) 08/21/2019 2118    Radiological Exams on Admission: CT Soft Tissue Neck W Contrast  Result Date: 04/30/2020 CLINICAL DATA:  Neck abscess, deep tissue. Additional history provided: Prior tracheostomy, swelling in neck, trouble breathing. EXAM: CT NECK WITH CONTRAST TECHNIQUE: Multidetector CT imaging of the neck was performed using the standard protocol following the bolus administration of intravenous contrast. CONTRAST:  100mL OMNIPAQUE IOHEXOL 350 MG/ML SOLN COMPARISON:  Concurrently performed chest CT 04/30/2020. Prior neck ultrasound 08/15/2017. Prior neck CT 02/06/2017. FINDINGS: Evaluation is limited by streak and beam hardening artifact at the level of the lower neck/upper chest. Pharynx and larynx: Streak artifact from dental restoration partially obscures the oral cavity. Poor dentition with multiple carious teeth and multifocal periapical lucency. No appreciable swelling or discrete mass within the oral cavity, pharynx or larynx. Findings of possible right vocal cord paresis, unchanged. Salivary glands: No inflammation, mass, or stone. Thyroid: Unremarkable. Lymph nodes: No pathologically enlarged cervical chain lymph nodes are identified. Vascular: The major vascular structures of the neck are patent. Partially retropharyngeal course of the cervical right ICA. Limited intracranial: No acute intracranial abnormality is identified. Visualized orbits: No mass or acute finding. Mastoids and visualized paranasal sinuses: No significant  paranasal sinus disease or mastoid effusion at the imaged levels. Skeleton: No acute bony abnormality or aggressive osseous lesion. Reversal of the expected cervical lordosis. C4-C5 grade 1 anterolisthesis. Cervical spondylosis without high-grade bony spinal canal stenosis. Upper chest: Reported separately. Other: Redemonstrated tracheostomy tract within the ventral lower neck. No surrounding inflammatory changes are appreciated. No surrounding fluid collection or abscess is identified. IMPRESSION: Examination limited by streak and beam hardening artifact at the level of the lower neck and upper chest. Redemonstrated tracheostomy tract within the ventral lower neck. No surrounding inflammatory changes are evident by CT. No surrounding fluid collection or abscess is identified. Electronically Signed   By: Jackey LogeKyle  Golden DO   On: 04/30/2020 16:33   CT Angio Chest PE W/Cm &/Or Wo Cm  Result Date: 04/30/2020 CLINICAL DATA:  Neck abscess, dyspnea EXAM: CT ANGIOGRAPHY CHEST WITH CONTRAST TECHNIQUE: Multidetector CT imaging of the chest was performed using the standard protocol during bolus administration of intravenous contrast. Multiplanar CT image reconstructions and MIPs were obtained to evaluate the vascular anatomy. CONTRAST:  100mL OMNIPAQUE IOHEXOL 350 MG/ML SOLN COMPARISON:  09/30/2016 FINDINGS: Cardiovascular: Satisfactory opacification of the pulmonary arteries to the segmental level. No evidence of pulmonary embolism. The central pulmonary arteries are enlarged in keeping with pulmonary arterial hypertension. No significant coronary artery calcification. Normal heart size. No pericardial effusion. The thoracic aorta is unremarkable. Mediastinum/Nodes: No enlarged mediastinal, hilar, or axillary lymph nodes. Thyroid gland, trachea, and esophagus demonstrate no significant findings. Lungs/Pleura:  Scattered areas of air trapping are identified, best seen within the lung apices. Mild bibasilar atelectasis. No  superimposed focal pulmonary nodule or infiltrate. No pneumothorax or pleural effusion. The central airways are widely patent. Tracheostomy defect noted within the subglottic airway Upper Abdomen: No acute abnormality. Musculoskeletal: No acute bone abnormality. No lytic or blastic bone lesion. Review of the MIP images confirms the above findings. IMPRESSION: No pulmonary embolism. Mild scattered areas of air trapping possibly related to small airways disease. No superimposed focal pulmonary infiltrate. Electronically Signed   By: Helyn Numbers MD   On: 04/30/2020 16:24   DG Chest Port 1 View  Result Date: 04/30/2020 CLINICAL DATA:  Chest pain and shortness of breath EXAM: PORTABLE CHEST 1 VIEW COMPARISON:  January 22, 2019 FINDINGS: Lungs are clear. Heart size and pulmonary vascularity are normal. No adenopathy. No pneumothorax. No bone lesions. IMPRESSION: Lungs clear.  Cardiac silhouette within normal limits. Electronically Signed   By: Bretta Bang III M.D.   On: 04/30/2020 11:17    EKG: Independently reviewed.  EKG showed sinus rhythm with some flattening of T waves in the anterior leads and signs of LVH.  Assessment/Plan Principal Problem:   Chest pain, rule out acute myocardial infarction Active Problems:   Cerebrovascular accident (HCC)   Hypertension associated with diabetes (HCC)   Anemia   Type 2 diabetes mellitus with peripheral neuropathy (HCC)   Gastro-esophageal reflux disease without esophagitis   CKD (chronic kidney disease), stage III (HCC)   MDD (major depressive disorder), recurrent severe, without psychosis (HCC)   Hyperthyroidism   Chronic obstructive lung disease (HCC)   Hyperlipidemia   Chronic right-sided low back pain without sciatica   Hyperlipidemia associated with type 2 diabetes mellitus (HCC)   Graves disease  Chest pain rule out ACS - Risk factors include obesity, diabetes, hypertension, prior CVA, hyperlipidemia - Low likelihood of ACS given unchanged  EKG, normal troponins, and lack of association with activity - Patient uncomfortable with discharge, will observe overnight - Chest wall tenderness was noted on exam (with reproduction of her pain), suspect pain is due to this in the setting of reported chronic cough, will trial Voltaren gel - We will recheck lipid panel - Observe in Telemetry  Hypertension -Continue home amlodipine and atenolol  CVA Hyperlipidemia -Continue home cholestyramine  Anemia - Describes chronic anemia currently stable at 11.6 - AM CBC  Diabetes - Last A1c 6.5, on 12/07/2018 - Currently diet controlled - Check A1c  GERD -GI Home PPI  CKD 3 - Creatinine stable at 1.04 - AM BMP  Insomnia Major depressive disorder  -Continue home trazodone and hydroxyzine  Hyperthyroidism/Graves' disease - Continue home methimazole - TSH, was normal in January  Obstructive pulmonary disease - Continue home Breo and albuterol  Chronic back pain - Can try Voltaren on her back as well  DVT prophylaxis: Lovenox Code Status:   Full Family Communication:  Not performed on admission  Disposition Plan:   Patient is from:  Home  Anticipated DC to:  Home  Anticipated DC date:  05/01/2020  Anticipated DC barriers: None identified  Consults called:  None  Admission status:  Observation, telemetry  Severity of Illness: The appropriate patient status for this patient is OBSERVATION. Observation status is judged to be reasonable and necessary in order to provide the required intensity of service to ensure the patient's safety. The patient's presenting symptoms, physical exam findings, and initial radiographic and laboratory data in the context of their medical condition is felt  to place them at decreased risk for further clinical deterioration. Furthermore, it is anticipated that the patient will be medically stable for discharge from the hospital within 2 midnights of admission. The following factors support the  patient status of observation.   " The patient's presenting symptoms include chest pain, shortness of breath. " The physical exam findings include chest wall tenderness. " The initial radiographic and laboratory data are reassuring, but do show mild anemia and CKD.  Synetta Fail MD [facility]  How to contact the [facility] Attending or Consulting provider 7A - 7P or covering provider during after hours 7P -7A, for this patient?   1. Check the care team in [facility] and look for a) attending/consulting TR[facility] provider listed and b) the [facility] team listed 2. Log into [email or web address] and use [place]'s universal password to access. If you do not have the password, please contact the hospital operator. 3. Locate the [facility] provider you are looking for under [facility] and page to a number that you can be directly reached. 4. If you still have difficulty reaching the provider, please page the [facility] (Director on Call) for the Hospitalists listed on amion for assistance.  04/30/2020, 7:09 PM

## 2020-04-30 NOTE — ED Notes (Signed)
Report given to brenda RN

## 2020-04-30 NOTE — Telephone Encounter (Signed)
Patient calls nurse line reporting productive cough that started last Thursday. Patient reports that mucus is thick and white. Patient also reports vomiting bloody emesis with "chunks of meat". Reports headache, dizziness, and "feeling sick". Patient also reports chest heaviness and "funny feeling in her head". Patient is alert and oriented and able to speak in complete sentences.   Denies fever. Patient denies having COVID vaccines.   Spoke with Dr. Myriam Jacobson regarding patient. Patient is requesting to schedule appointment next week, as she takes transportation and does not have a way to get care sooner. Advised that patient be evaluated in ED due to current symptoms and other health issues.   Spoke with patient and informed of above. Patient is requesting that I call EMS for her. Called EMS. They will evaluate patient and take to ED.   FYI to PCP  Veronda Prude, RN

## 2020-04-30 NOTE — Progress Notes (Signed)
After admission, patient noted to follow with family medicine residency.  Contacted resident on call, they will assume care of the patient at 7 AM.

## 2020-04-30 NOTE — ED Provider Notes (Signed)
[facility] Provider [facility]   CSN: [id number] Arrival date & time: 04/30/20  1016     History Chief Complaint  Patient presents with  . Chest Pain  . Shortness of Breath  . Cough    Yesenia Yesenia [name] is a 59 y.o. female.  Patient brought in by EMS.  Patient with the acute onset of substernal chest pressure and discomfort.  Associated with shortness of breath.  And a cough since Thursday.  The anterior chest pain has been constant since it started and worse with breathing.  Patient states there is been some congestion and she has been coughing up some pink phlegm.  No vomiting or diarrhea.  But patient was seen in urgent care on September 16.  And had a negative Covid test at that time.  Patient has not had any of the Covid vaccines.  Patient without history of known coronary artery disease.  But patient states that she has had some chest discomfort like this in the past but never this severe but has not been seen for it.  In addition patient states that he is having trouble swallowing.  And feels as if things may get stuck kind of at the level of the old tracheostomy.  Patient with a history of Graves' disease history of tracheostomy hypertension hyperlipidemia irritable bowel syndrome struct of sleep apnea.  Chronic kidney disease and chronic pain.        Past Medical History:  Diagnosis Date  . Abdominal pain, epigastric   . Abscess   . Acute constipation 02/16/2016  . Acute stomach ulcer 08/15/2017  . AKI (acute kidney injury) (HCC)    Acute kidney injury on CKD stage II-III/notes 08/17/2017  . Anemia   . Anemia   . Arthritis    "knees" (08/17/2017)  . Bleeding 06/23/2016  . Blood transfusion without reported diagnosis 2012   anemia  . Cerebral infarction (HCC) 01/14/2013  . Chest pain 02/15/2013  . Chronic pain   . CKD (chronic kidney disease)    Stage 3  . Depression   . Dysuria 12/06/2018  . Fibromyalgia    hospitilized 12/16 due to  inability to walk  . Gastric ulcer   . Graves disease   . History of gastritis 2019  . History of tracheostomy 05/20/2016  . Hyperlipemia   . Hypertension   . Insomnia   . Iron deficiency anemia 05/26/2016  . Irritable bowel syndrome 05/21/2016  . Neuropathy   . Obstructive sleep apnea of adult 10/03/2015  . Right sided weakness   . Sleep apnea    "CPAPs didn't work for me; that's why they did OR" (08/17/2017)  . Stroke [facility](HCC) 2014?   no residual weakness   . 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 Tremor 10/25/2017  . Type II diabetes mellitus (HCC)     does not check CBG's (08/17/2017), no meds  . Vitamin deficiency    Vit D    Patient Active Problem List   Diagnosis Date Noted  . Right leg numbness 02/13/2020  . Decreased hearing 02/13/2020  . Sleeping difficulty 09/13/2019  . Graves disease 12/13/2018  . Elevated alkaline phosphatase level 12/13/2018  . Bilateral leg numbness 12/08/2018  . Healthcare maintenance 12/08/2018  . Anxiety 12/08/2018  . Chronic obstructive lung disease (HCC) 12/06/2018  . Hyperlipidemia 12/06/2018  . Neuropathy 12/06/2018  . Chronic right-sided low back pain without sciatica 12/06/2018  . Osteoarthritis of both knees 05/25/2018  . Hyperthyroidism 04/20/2018  . Dizziness  01/10/2018  . Hepatic steatosis 08/24/2017  . Vitamin D deficiency 08/23/2017  . Hyperlipidemia associated with type 2 diabetes mellitus (HCC) 08/23/2017  . Epigastric pain   . MDD (major depressive disorder), recurrent severe, without psychosis (HCC)   . Primary fibromyalgia syndrome 02/22/2016  . CKD (chronic kidney disease), stage III (HCC) 02/16/2016  . Morbid obesity with BMI of 50.0-59.9, adult (HCC) 01/19/2016  . Chronic pain 10/15/2015  . Circadian dysregulation 08/10/2015  . Polyneuropathy 07/10/2015  . Gastro-esophageal reflux disease without esophagitis 07/10/2015  . Abnormal gait 07/07/2015  . Injury of kidney 07/07/2015  . Type 2 diabetes mellitus with  peripheral neuropathy (HCC) 04/11/2013  . Dyspnea 02/15/2013  . Cerebrovascular accident (HCC) 01/14/2013  . Hypertension associated with diabetes (HCC) 01/14/2013  . Anemia 01/14/2013    Past Surgical History:  Procedure Laterality Date  . ANTERIOR INTEROSSEOUS NERVE DECOMPRESSION Right 06/16/2018   Procedure: RIGHT ULNAR NEUROPLATY AT THE ELBOW AND WRIST;  Surgeon: Mack Hook, MD;  Location: [facility] OR;  Service: Orthopedics;  Laterality: Right;  . ESOPHAGOGASTRODUODENOSCOPY (EGD) WITH PROPOFOL N/A 08/16/2017   Procedure: ESOPHAGOGASTRODUODENOSCOPY (EGD) WITH PROPOFOL;  Surgeon: Benancio Deeds, MD;  Location: [facility] ENDOSCOPY;  Service: Gastroenterology;  Laterality: N/A;  . KNEE ARTHROPLASTY  1997   "stuck rods in it"  . KNEE ARTHROSCOPY Right 1992, 1995,  . OOPHORECTOMY Right 1993?  . TRACHEOSTOMY TUBE PLACEMENT N/A 02/13/2016   Procedure: TRACHEOSTOMY;  Surgeon: Christia Reading, MD;  Location: [facility] OR;  Service: ENT;  Laterality: N/A;  . TUBAL LIGATION       OB History   No obstetric history on file.     Family History  Problem Relation Age of Onset  . Breast cancer Mother   . Leukemia Brother   . Breast cancer Maternal Grandmother   . Colon cancer Neg Hx   . Esophageal cancer Neg Hx   . Rectal cancer Neg Hx   . Stomach cancer Neg Hx     Social History   Tobacco Use  . Smoking status: Never Smoker  . Smokeless tobacco: Never Used  Vaping Use  . Vaping Use: Never used  Substance Use Topics  . Alcohol use: No    Alcohol/week: 0.0 standard drinks  . Drug use: No    Home Medications Prior to Admission medications   Medication Sig Start Date End Date Taking? Authorizing Provider  albuterol (VENTOLIN HFA) 108 (90 Base) MCG/ACT inhaler Inhale 2 puffs into the lungs every 6 (six) hours as needed for wheezing or shortness of breath. 01/18/20   Melene Plan, MD  amLODipine (NORVASC) 5 MG tablet Take 1 tablet (5 mg total) by mouth daily. 02/22/20   Melene Plan, MD  atenolol  (TENORMIN) 100 MG tablet TAKE ONE TABLET BY MOUTH EVERY DAY FOR HIGH BLOOD PRESSURE Patient taking differently: Take 100 mg by mouth daily.  06/12/19   Melene Plan, MD  Blood Pressure Monitoring (BLOOD PRESSURE MON/AUTO/WRIST) DEVI 1 Units by Does not apply route daily. 08/23/19   Melene Plan, MD  cholestyramine Lanetta Inch) 4 g packet Use one 4 gm packed mixed into 4-6 oz of clear liquid daily. Do not take within 4 hours of any other medication 04/09/20   Arnaldo Natal, NP  Diaper Rash Products (DESITIN MULTI-PURPOSE HEALING) OINT Apply 1 application topically 2 (two) times daily. 07/27/19   Melene Plan, MD  dicyclomine (BENTYL) 10 MG capsule Take 1 capsule (10 mg total) by mouth 2 (two) times daily before a  meal. 01/18/20   Melene Plan, MD  fluticasone furoate-vilanterol (BREO ELLIPTA) 100-25 MCG/INH AEPB Inhale 1 puff into the lungs daily. 08/08/19   Parrett, Virgel Bouquet, NP  gabapentin (NEURONTIN) 300 MG capsule Take 3 capsules (900 mg total) by mouth at bedtime. 04/24/20   Melene Plan, MD  hydrOXYzine (ATARAX/VISTARIL) 25 MG tablet TAKE ONE TABLET BY MOUTH AT BEDTIME FOR ANXIETY WITH 50MG  OF HYDROXYZINE TO MAKE 75MG  04/21/20   , MD  hydrOXYzine (ATARAX/VISTARIL) 50 MG tablet Take 1.5 tablets (75 mg total) by mouth at bedtime. Patient taking differently: Take 50 mg by mouth at bedtime. Takes with 25 mg to get 75 mg 09/12/19   Melene Plan, MD  methimazole (TAPAZOLE) 10 MG tablet Take 1.5 tablets (15 mg total) by mouth daily. 04/01/20   Melene Plan, MD  ondansetron (ZOFRAN-ODT) 8 MG disintegrating tablet Take 1 tablet (8 mg total) by mouth every 8 (eight) hours as needed for nausea or vomiting. 11/27/18   Armbruster, Melene Plan, MD  pantoprazole (PROTONIX) 40 MG tablet Take 1 tablet (40 mg total) by mouth 2 (two) times daily. Patient taking differently: Take 40 mg by mouth daily as needed (acid reflux).  06/14/19   Willaim Rayas, MD  promethazine (PHENERGAN) 12.5 MG tablet Take 1  tablet (12.5 mg total) by mouth at bedtime as needed for nausea or vomiting. 10/11/19   Melene Plan, MD  ramelteon (ROZEREM) 8 MG tablet Take 1 tablet (8 mg total) by mouth at bedtime as needed for sleep. 02/04/20 02/03/21  02/06/20, MD  traZODone (DESYREL) 50 MG tablet Take 1 tablet (50 mg total) by mouth at bedtime. 03/28/20   Melene Plan, MD  traZODone (DESYREL) 50 MG tablet Take 1 tablet (50 mg total) by mouth at bedtime. 08/17/19   Melene Plan, MD    Allergies    Buprenorphine hcl and Morphine and related  Review of Systems   Review of Systems  Constitutional: Negative for chills and fever.  HENT: Positive for congestion and trouble swallowing. Negative for rhinorrhea and sore throat.   Eyes: Negative for visual disturbance.  Respiratory: Positive for shortness of breath. Negative for cough.   Cardiovascular: Positive for chest pain. Negative for leg swelling.  Gastrointestinal: Negative for abdominal pain, diarrhea, nausea and vomiting.  Genitourinary: Negative for dysuria.  Musculoskeletal: Negative for back pain and neck pain.  Skin: Negative for rash.  Neurological: Negative for dizziness, light-headedness and headaches.  Hematological: Does not bruise/bleed easily.  Psychiatric/Behavioral: Negative for confusion.    Physical Exam Updated Vital Signs BP 118/76   Pulse 73   Temp 98.7 F (37.1 C) (Oral)   Resp 15   Ht 1.676 m (5\' 6" )   Wt (!) 154.2 kg   SpO2 95%   BMI 54.88 kg/m   Physical Exam Vitals and nursing note reviewed.  Constitutional:      General: She is not in acute distress.    Appearance: Normal appearance. She is well-developed.  HENT:     Head: Normocephalic and atraumatic.  Eyes:     Extraocular Movements: Extraocular movements intact.     Conjunctiva/sclera: Conjunctivae normal.     Pupils: Pupils are equal, round, and reactive to light.  Neck:     Comments: Anterior closed tracheostomy scar. Cardiovascular:     Rate and Rhythm:  Normal rate and regular rhythm.     Heart sounds: No murmur heard.   Pulmonary:  Effort: Pulmonary effort is normal. No respiratory distress.     Breath sounds: Wheezing present.  Abdominal:     Palpations: Abdomen is soft.     Tenderness: There is no abdominal tenderness.  Musculoskeletal:        General: Swelling present.     Cervical back: Neck supple.  Skin:    General: Skin is warm and dry.  Neurological:     General: No focal deficit present.     Mental Status: She is alert and oriented to person, place, and time.     Cranial Nerves: No cranial nerve deficit.     Sensory: No sensory deficit.     Motor: No weakness.     ED Results / Procedures / Treatments   Labs (all labs ordered are listed, but only abnormal results are displayed) Labs Reviewed  CBC WITH DIFFERENTIAL/PLATELET - Abnormal; Notable for the following components:      Result Value   Hemoglobin 11.6 (*)    All other components within normal limits  COMPREHENSIVE METABOLIC PANEL - Abnormal; Notable for the following components:   Glucose, Bld 153 (*)    Creatinine, Ser 1.04 (*)    Calcium 8.6 (*)    Albumin 3.4 (*)    GFR, Estimated 59 (*)    All other components within normal limits  RESPIRATORY PANEL BY RT PCR (FLU A&B, COVID)  TROPONIN I (HIGH SENSITIVITY)  TROPONIN I (HIGH SENSITIVITY)    EKG EKG Interpretation  Date/Time:  Wednesday April 30 2020 10:29:48 EDT Ventricular Rate:  78 PR Interval:    QRS Duration: 97 QT Interval:  414 QTC Calculation: 472 R Axis:   16 Text Interpretation: Sinus rhythm Left ventricular hypertrophy Borderline T abnormalities, anterior leads No significant change since last tracing Confirmed by Vanetta Mulders [phone]) on 04/30/2020 10:53:20 AM   Radiology DG Chest Port 1 View  Result Date: 04/30/2020 CLINICAL DATA:  Chest pain and shortness of breath EXAM: PORTABLE CHEST 1 VIEW COMPARISON:  January 22, 2019 FINDINGS: Lungs are clear. Heart size and  pulmonary vascularity are normal. No adenopathy. No pneumothorax. No bone lesions. IMPRESSION: Lungs clear.  Cardiac silhouette within normal limits. Electronically Signed   By: Bretta Bang III M.D.   On: 04/30/2020 11:17    Procedures Procedures (including critical care time)  Medications Ordered in ED Medications  nitroGLYCERIN (NITROSTAT) SL tablet 0.4 mg (has no administration in time range)  aspirin chewable tablet 324 mg (has no administration in time range)  albuterol (VENTOLIN HFA) 108 (90 Base) MCG/ACT inhaler 2 puff (2 puffs Inhalation Given 04/30/20 1343)  iohexol (OMNIPAQUE) 350 MG/ML injection 100 mL (100 mLs Intravenous Contrast Given 04/30/20 1613)    ED Course  I have reviewed the triage vital signs and the nursing notes.  Pertinent labs & imaging results that were available during my care of the patient were reviewed by me and considered in my medical decision making (see chart for details).    MDM Rules/Calculators/A&P                          Patient's work-up here troponins x2 both very normal.  But pain has persisted.  There was some improvement after albuterol inhaler.  Patient did have some wheezing when she first presented.  But that has now gone away.  No leukocytosis.  Hemoglobin 11.6.  Covid testing negative flu testing negative  Based on patient's complaint opted to do CT angio chest to  rule out pulmonary embolus.  And to get a closer look at the lungs.  Also because of the difficulty around the tracheostomy site where she feels like things are stuck or that things are closing off CT soft tissue neck was done as well.  Post test are pending.  Based on the fact she still having the persistent chest pain even in the face of the 2 - troponins have ordered nitroglycerin and aspirin.  Disposition will be based on the CT chest and soft tissue neck.  But patient may require admission for the persistent chest pain but it is reassuring that there is been to  negative troponins so far.  Patient will be turned over to the evening emergency physician Dr. Effie Shy.     Final Clinical Impression(s) / ED Diagnoses Final diagnoses:  Precordial pain  SOB (shortness of breath)    Rx / DC Orders ED Discharge Orders    None       Vanetta Mulders, MD 04/30/20 1623

## 2020-05-01 DIAGNOSIS — E0500 Thyrotoxicosis with diffuse goiter without thyrotoxic crisis or storm: Secondary | ICD-10-CM | POA: Diagnosis present

## 2020-05-01 DIAGNOSIS — F332 Major depressive disorder, recurrent severe without psychotic features: Secondary | ICD-10-CM | POA: Diagnosis present

## 2020-05-01 DIAGNOSIS — Z8711 Personal history of peptic ulcer disease: Secondary | ICD-10-CM | POA: Diagnosis not present

## 2020-05-01 DIAGNOSIS — M545 Low back pain, unspecified: Secondary | ICD-10-CM | POA: Diagnosis present

## 2020-05-01 DIAGNOSIS — G8929 Other chronic pain: Secondary | ICD-10-CM | POA: Diagnosis present

## 2020-05-01 DIAGNOSIS — J449 Chronic obstructive pulmonary disease, unspecified: Secondary | ICD-10-CM

## 2020-05-01 DIAGNOSIS — R079 Chest pain, unspecified: Secondary | ICD-10-CM | POA: Diagnosis not present

## 2020-05-01 DIAGNOSIS — E1122 Type 2 diabetes mellitus with diabetic chronic kidney disease: Secondary | ICD-10-CM | POA: Diagnosis present

## 2020-05-01 DIAGNOSIS — Z8249 Family history of ischemic heart disease and other diseases of the circulatory system: Secondary | ICD-10-CM | POA: Diagnosis not present

## 2020-05-01 DIAGNOSIS — Z806 Family history of leukemia: Secondary | ICD-10-CM | POA: Diagnosis not present

## 2020-05-01 DIAGNOSIS — K589 Irritable bowel syndrome without diarrhea: Secondary | ICD-10-CM | POA: Diagnosis present

## 2020-05-01 DIAGNOSIS — R072 Precordial pain: Secondary | ICD-10-CM | POA: Diagnosis present

## 2020-05-01 DIAGNOSIS — I129 Hypertensive chronic kidney disease with stage 1 through stage 4 chronic kidney disease, or unspecified chronic kidney disease: Secondary | ICD-10-CM | POA: Diagnosis present

## 2020-05-01 DIAGNOSIS — E1169 Type 2 diabetes mellitus with other specified complication: Secondary | ICD-10-CM | POA: Diagnosis present

## 2020-05-01 DIAGNOSIS — E785 Hyperlipidemia, unspecified: Secondary | ICD-10-CM | POA: Diagnosis present

## 2020-05-01 DIAGNOSIS — D509 Iron deficiency anemia, unspecified: Secondary | ICD-10-CM | POA: Diagnosis not present

## 2020-05-01 DIAGNOSIS — M940 Chondrocostal junction syndrome [Tietze]: Secondary | ICD-10-CM | POA: Diagnosis present

## 2020-05-01 DIAGNOSIS — K219 Gastro-esophageal reflux disease without esophagitis: Secondary | ICD-10-CM

## 2020-05-01 DIAGNOSIS — Z20822 Contact with and (suspected) exposure to covid-19: Secondary | ICD-10-CM | POA: Diagnosis present

## 2020-05-01 DIAGNOSIS — E1142 Type 2 diabetes mellitus with diabetic polyneuropathy: Secondary | ICD-10-CM | POA: Diagnosis present

## 2020-05-01 DIAGNOSIS — D649 Anemia, unspecified: Secondary | ICD-10-CM | POA: Diagnosis present

## 2020-05-01 DIAGNOSIS — Z6841 Body Mass Index (BMI) 40.0 and over, adult: Secondary | ICD-10-CM | POA: Diagnosis not present

## 2020-05-01 DIAGNOSIS — Z8673 Personal history of transient ischemic attack (TIA), and cerebral infarction without residual deficits: Secondary | ICD-10-CM | POA: Diagnosis not present

## 2020-05-01 DIAGNOSIS — N182 Chronic kidney disease, stage 2 (mild): Secondary | ICD-10-CM | POA: Diagnosis present

## 2020-05-01 DIAGNOSIS — Z803 Family history of malignant neoplasm of breast: Secondary | ICD-10-CM | POA: Diagnosis not present

## 2020-05-01 LAB — GLUCOSE, CAPILLARY
Glucose-Capillary: 133 mg/dL — ABNORMAL HIGH (ref 70–99)
Glucose-Capillary: 206 mg/dL — ABNORMAL HIGH (ref 70–99)
Glucose-Capillary: 148 mg/dL — ABNORMAL HIGH (ref 70–99)
Glucose-Capillary: 130 mg/dL — ABNORMAL HIGH (ref 70–99)

## 2020-05-01 LAB — LIPID PANEL
Cholesterol: 256 mg/dL — ABNORMAL HIGH (ref 0–200)
HDL: 51 mg/dL (ref >40)
Total CHOL/HDL Ratio: 5 RATIO
Triglycerides: 202 mg/dL — ABNORMAL HIGH (ref <150)
VLDL: 40 mg/dL (ref 0–40)

## 2020-05-01 MED ORDER — ATORVASTATIN CALCIUM 40 MG PO TABS
40.0000 mg | ORAL_TABLET | Freq: Every day | ORAL | Status: DC
  Administered 2020-05-01 – 2020-05-02 (×2): 40 mg via ORAL
  Filled 2020-05-01 (×2): qty 1

## 2020-05-01 MED ORDER — ASPIRIN 325 MG PO TABS
650.0000 mg | ORAL_TABLET | Freq: Every day | ORAL | Status: DC
  Administered 2020-05-01 – 2020-05-02 (×2): 650 mg via ORAL
  Filled 2020-05-01 (×2): qty 2

## 2020-05-01 MED ORDER — ATENOLOL 25 MG PO TABS: 50.0000 mg | ORAL_TABLET | Freq: Once | ORAL | Status: DC

## 2020-05-01 NOTE — Progress Notes (Signed)
Family Medicine Teaching Service Daily Progress Note Intern Pager: [phone]  Patient name: Yesenia [name] Medical record number: [id number] Date of birth: [name] 08, 1961 Age: 59 y.o. Gender: female  Primary Care Provider: Melene Plan, MD Consultants: Cardiology Code Status: Full  Pt Overview and Major Events to Date:  Admitted 10/20  Assessment and Plan: Yesenia [name] is a 59 y.o. female who presented with chest pain. Past medical history significant of hypertension, CVA, anemia, diabetes, hyperlipidemia, GERD, CKD 3, major depressive disorder, hyperthyroidism/Graves' disease, obstructive pulmonary disease, and obesity.  Chest pain rule out ACS  Patient complains of 2 weeks of worsening chest pain. Negative troponin x2, negative respiratory panel.  Chest x-ray showed no acute disease.  CT neck and CTA unremarkable.  She received aspirin and nitroglycerin with some improvement in her chest pain.  EKG unchanged.  - cardiology consulted, appreciate recs  - Tylenol and aspirin for pain 650 mg prn  - Nitro prn for chest pain - Zofran prn nausea 15mg    Hypertension BP ranging from 115-142/12-24 -Continue home amlodipine 5mg  and atenolol 10mg    Hyperlipidemia -Continue home cholestyramine 4g daily   Anemia - Describes chronic anemia currently stable at 11.6 - AM CBC  DM2 -Last A1c 6.5, on 12/07/2018. Currently diet controlled - gabapentin 900mg  daily  - SSI  GERD -Home protonix 40mg    CKD 3 - Creatinine stable at 1.04 - AM BMP  Insomnia I Major depressive disorder  -Continue home trazodone and hydroxyzine 75 mg  Hyperthyroidism/Graves' disease  TSH wnl - Continue home methimazole  Obstructive pulmonary disease - Continue home Breo and albuterol   FEN/GI: Cardiac diet   PPx: Lovenox   Disposition: Home likely tomorrow   Subjective:  No acute events overnight. Patient complaining of chest pain when I examined her this morning. Endorses a pressure feeling in  the center of her chest that radiates to her back. States its also tender when she presses on it, and it hurts when she breathes deeply.   Objective: Temp:  [97.8 F (36.6 C)-98.7 F (37.1 C)] 98.1 F (36.7 C) (10/21 0451) Pulse Rate:  [67-82] 82 (10/20 2330) Resp:  [12-24] 15 (10/21 0451) BP: (115-135)/(69-103) 121/80 (10/21 0451) SpO2:  [92 %-100 %] 100 % (10/21 0451) Weight:  [153.8 kg-154.2 kg] 153.8 kg (10/20 2131) Physical Exam: General: Obese, NAD Cardiovascular: RRR. No murmurs . Tender to palpation mid chest wall  Respiratory: CTAB. Normal WOB Abdomen: Obese, non tender to palpation  Extremities: warm, dry. No edema. Distal pulses 2+   Laboratory: Recent Labs  Lab 04/30/20 1106  WBC 8.1  HGB 11.6*  HCT 37.8  PLT 337   Recent Labs  Lab 04/30/20 1106  NA 139  K 4.0  CL 105  CO2 22  BUN 12  CREATININE 1.04*  CALCIUM 8.6*  PROT 6.7  BILITOT 0.4  ALKPHOS 81  ALT 24  AST 25  GLUCOSE 153*     Imaging/Diagnostic Tests: CT Soft Tissue Neck W Contrast Result Date: 04/30/2020 Examination limited by streak and beam hardening artifact at the level of the lower neck and upper chest. Redemonstrated tracheostomy tract within the ventral lower neck. No surrounding inflammatory changes are evident by CT. No surrounding fluid collection or abscess is identified.  CT Angio Chest PE W/Cm &/Or Wo Cm Result Date: 04/30/2020 No pulmonary embolism. Mild scattered areas of air trapping possibly related to small airways disease. No superimposed focal pulmonary infiltrate.  DG Chest Port 1 View  Result Date: 04/30/2020 Lungs  clear.  Cardiac silhouette within normal limits.    Yesenia Collum, DO 05/01/2020, 7:06 AM PGY-1, [facility] FPTS Intern pager: [phone], text pages welcome

## 2020-05-01 NOTE — Hospital Course (Signed)
° ° ° °  F/u pulm outpatient for potential restrictive lung dz  Start statin  Recheck LDL  3 months

## 2020-05-01 NOTE — Progress Notes (Signed)
Pt has hx of diabetes. CBG 194. Paged MD Opyd. MD ordered AC/HS CBG checks and sliding scale insulin. Pt requested hydroxyzine to help her sleep. MD ordered hydroxyzine 75mg  PRN at bedtime. , RN

## 2020-05-01 NOTE — Consult Note (Signed)
Cardiology Consultation:   Patient ID: Yesenia [name] MRN: [id number]; DOB: 11-08-1960  Admit date: 04/30/2020 Date of Consult: 05/01/2020  Primary Care Provider: Melene PlanKim, Rachel E, MD [facility] Cardiologist: No primary care provider on file. Dr. Rennis GoldenHilty last saw 2014. [facility] Electrophysiologist:  None    Patient Profile:   Yesenia FrederickSonja R Scalia is a 59 y.o. female with a hx of CVA X 2, DM, HTN HLD, knee surgeries, hyperthyroidism/Grave's disease COPD and morbid obesity who is being seen today for the evaluation of chest pain at the request of Dr. McDiarmid.  History of Present Illness:   [name] with above hx and has had nuc study that was normal and echo with EF 50-55% with severe hypokinesis of basal-mid inferoseptal myocardium G1DD in 2014 and pt instructed to lose wt and be more active.     Pt presented 04/30/20 with chest pressure SOB and cough since last week.  She had flu like symptoms 1-2 weeks ago and since she has had chest pain and feels bad.  Her pain is 5/10 pressure pain and at times feels like it is radiating to her back.   ASA and NTG improved the pain in ER.  Pain also increases with deep breath.  She also rec'd albuterol.    This morning the pain woke her from sleep it was severe enough to make her cry. .   Troponin 3,4,3 Na 139, K+ 4.0 glucose 153, BUN 12 Cr 1.04  WBC 8.1, Hgb 11.6, plts 337  TSH 1.287 COVID neg  PCXR NAD   EKG:  The EKG was personally reviewed and demonstrates:  SR with LVH borderline T wave abnormalities at 78  Follow up SR at 81 non specific T wave abnormality. Telemetry:  Telemetry was personally reviewed and demonstrates:  SR   CT chest neg PE IMPRESSION: No pulmonary embolism. No significant coronary artery calcification  Head and Neck CT  IMPRESSION: Examination limited by streak and beam hardening artifact at the level of the lower neck and upper chest.  Redemonstrated tracheostomy tract within the ventral lower neck.  No surrounding inflammatory changes are evident by CT. No surrounding fluid collection or abscess is identified.  Mild scattered areas of air trapping possibly related to small airways disease. No superimposed focal pulmonary infiltrate.  BP 142/81 and 139/82 P 70s to 80s afebrile  On isolation for ESBL    Past Medical History:  Diagnosis Date  . Abdominal pain, epigastric   . Abscess   . Acute constipation 02/16/2016  . Acute stomach ulcer 08/15/2017  . AKI (acute kidney injury) (HCC)    Acute kidney injury on CKD stage II-III/notes 08/17/2017  . Anemia   . Anemia   . Arthritis    "knees" (08/17/2017)  . Bleeding 06/23/2016  . Blood transfusion without reported diagnosis 2012   anemia  . Cerebral infarction (HCC) 01/14/2013  . Chest pain 02/15/2013  . Chronic pain   . CKD (chronic kidney disease)    Stage 3  . Depression   . Dysuria 12/06/2018  . Fibromyalgia    hospitilized 12/16 due to inability to walk  . Gastric ulcer   . Graves disease   . History of gastritis 2019  . History of tracheostomy 05/20/2016  . Hyperlipemia   . Hypertension   . Insomnia   . Iron deficiency anemia 05/26/2016  . Irritable bowel syndrome 05/21/2016  . Neuropathy   . Obstructive sleep apnea of adult 10/03/2015  . Right sided weakness   .  Sleep apnea    "CPAPs didn't work for me; that's why they did OR" (08/17/2017)  . Stroke [facility]) 2014?   no residual weakness   . 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 Tremor 10/25/2017  . Type II diabetes mellitus (HCC)     does not check CBG's (08/17/2017), no meds  . Vitamin deficiency    Vit D    Past Surgical History:  Procedure Laterality Date  . ANTERIOR INTEROSSEOUS NERVE DECOMPRESSION Right 06/16/2018   Procedure: RIGHT ULNAR NEUROPLATY AT THE ELBOW AND WRIST;  Surgeon: Mack Hook, MD;  Location[facility] OR;  Service: Orthopedics;  Laterality: Right;  . ESOPHAGOGASTRODUODENOSCOPY (EGD) WITH PROPOFOL N/A 08/16/2017   Procedure:  ESOPHAGOGASTRODUODENOSCOPY (EGD) WITH PROPOFOL;  Surgeon: Benancio Deeds, MD;  Location: [facility] ENDOSCOPY;  Service: Gastroenterology;  Laterality: N/A;  . KNEE ARTHROPLASTY  1997   "stuck rods in it"  . KNEE ARTHROSCOPY Right 1992, 1995,  . OOPHORECTOMY Right 1993?  . TRACHEOSTOMY TUBE PLACEMENT N/A 02/13/2016   Procedure: TRACHEOSTOMY;  Surgeon: Christia Reading, MD;  Location: [facility] OR;  Service: ENT;  Laterality: N/A;  . TUBAL LIGATION       Home Medications:  Prior to Admission medications   Medication Sig Start Date End Date Taking? Authorizing Provider  albuterol (VENTOLIN HFA) 108 (90 Base) MCG/ACT inhaler Inhale 2 puffs into the lungs every 6 (six) hours as needed for wheezing or shortness of breath. 01/18/20  Yes Melene Plan, MD  amLODipine (NORVASC) 5 MG tablet Take 1 tablet (5 mg total) by mouth daily. 02/22/20  Yes Melene Plan, MD  atenolol (TENORMIN) 100 MG tablet TAKE ONE TABLET BY MOUTH EVERY DAY FOR HIGH BLOOD PRESSURE Patient taking differently: Take 100 mg by mouth daily.  06/12/19  Yes Melene Plan, MD  Blood Pressure Monitoring (BLOOD PRESSURE MON/AUTO/WRIST) DEVI 1 Units by Does not apply route daily. 08/23/19  Yes Melene Plan, MD  cholestyramine Lanetta Inch) 4 g packet Use one 4 gm packed mixed into 4-6 oz of clear liquid daily. Do not take within 4 hours of any other medication 04/09/20  Yes Arnaldo Natal, NP  Diaper Rash Products (DESITIN MULTI-PURPOSE HEALING) OINT Apply 1 application topically 2 (two) times daily. 07/27/19  Yes Melene Plan, MD  dicyclomine (BENTYL) 10 MG capsule Take 1 capsule (10 mg total) by mouth 2 (two) times daily before a meal. 01/18/20  Yes Melene Plan, MD  fluticasone furoate-vilanterol (BREO ELLIPTA) 100-25 MCG/INH AEPB Inhale 1 puff into the lungs daily. 08/08/19  Yes Parrett, Tammy S, NP  gabapentin (NEURONTIN) 300 MG capsule Take 3 capsules (900 mg total) by mouth at bedtime. 04/24/20  Yes Melene Plan, MD  hydrOXYzine  (ATARAX/VISTARIL) 25 MG tablet TAKE ONE TABLET BY MOUTH AT BEDTIME FOR ANXIETY WITH 50MG  OF HYDROXYZINE TO MAKE 75MG  Patient taking differently: Take 25 mg by mouth every 6 (six) hours as needed for anxiety.  04/21/20  Yes , MD  methimazole (TAPAZOLE) 10 MG tablet Take 1.5 tablets (15 mg total) by mouth daily. 04/01/20  Yes Melene Plan, MD  pantoprazole (PROTONIX) 40 MG tablet Take 1 tablet (40 mg total) by mouth 2 (two) times daily. Patient taking differently: Take 40 mg by mouth daily as needed (acid reflux).  06/14/19  Yes Melene Plan, MD  ramelteon (ROZEREM) 8 MG tablet Take 1 tablet (8 mg total) by mouth at bedtime as needed for sleep. 02/04/20 02/03/21 Yes 02/06/20, MD  traZODone (  DESYREL) 50 MG tablet Take 1 tablet (50 mg total) by mouth at bedtime. 03/28/20  Yes Melene Plan, MD  hydrOXYzine (ATARAX/VISTARIL) 50 MG tablet Take 1.5 tablets (75 mg total) by mouth at bedtime. Patient not taking: Reported on 04/30/2020 09/12/19   Melene Plan, MD  ondansetron (ZOFRAN-ODT) 8 MG disintegrating tablet Take 1 tablet (8 mg total) by mouth every 8 (eight) hours as needed for nausea or vomiting. Patient not taking: Reported on 04/30/2020 11/27/18   Benancio Deeds, MD  promethazine (PHENERGAN) 12.5 MG tablet Take 1 tablet (12.5 mg total) by mouth at bedtime as needed for nausea or vomiting. Patient not taking: Reported on 04/30/2020 10/11/19   Lennox Solders, MD  traZODone (DESYREL) 50 MG tablet Take 1 tablet (50 mg total) by mouth at bedtime. 08/17/19   Melene Plan, MD    Inpatient Medications: Scheduled Meds: . amLODipine  5 mg Oral Daily  . atenolol  100 mg Oral Daily  . cholestyramine  4 g Oral Daily  . diclofenac Sodium  2 g Topical QID  . dicyclomine  10 mg Oral BID AC  . enoxaparin (LOVENOX) injection  75 mg Subcutaneous Q24H  . fluticasone furoate-vilanterol  1 puff Inhalation Daily  . gabapentin  900 mg Oral QHS  . insulin aspart  0-5 Units Subcutaneous QHS  .  insulin aspart  0-9 Units Subcutaneous TID WC  . methimazole  15 mg Oral Daily  . pantoprazole  40 mg Oral BID  . traZODone  50 mg Oral QHS   Continuous Infusions:  PRN Meds: acetaminophen, albuterol, hydrOXYzine, ondansetron  Allergies:    Allergies  Allergen Reactions  . Buprenorphine Hcl Hives  . Morphine And Related Hives and Dermatitis    Social History:   Social History   Socioeconomic History  . Marital status: Divorced    Spouse name: Not on file  . Number of children: 2  . Years of education: 12th   . Highest education level: Not on file  Occupational History  . Occupation: SSI  Tobacco Use  . Smoking status: Never Smoker  . Smokeless tobacco: Never Used  Vaping Use  . Vaping Use: Never used  Substance and Sexual Activity  . Alcohol use: No    Alcohol/week: 0.0 standard drinks  . Drug use: No  . Sexual activity: Never  Other Topics Concern  . Not on file  Social History Narrative   Reports no caffeine use    Social Determinants of Health   Financial Resource Strain:   . Difficulty of Paying Living Expenses: Not on file  Food Insecurity:   . Worried About Programme researcher, broadcasting/film/video in the Last Year: Not on file  . Ran Out of Food in the Last Year: Not on file  Transportation Needs:   . Lack of Transportation (Medical): Not on file  . Lack of Transportation (Non-Medical): Not on file  Physical Activity:   . Days of Exercise per Week: Not on file  . Minutes of Exercise per Session: Not on file  Stress:   . Feeling of Stress : Not on file  Social Connections:   . Frequency of Communication with Friends and Family: Not on file  . Frequency of Social Gatherings with Friends and Family: Not on file  . Attends Religious Services: Not on file  . Active Member of Clubs or Organizations: Not on file  . Attends Banker Meetings: Not on file  . Marital Status: Not on file  Intimate Partner Violence:   . Fear of Current or Ex-Partner: Not on file  .  Emotionally Abused: Not on file  . Physically Abused: Not on file  . Sexually Abused: Not on file    Family History:    Family History  Problem Relation Age of Onset  . Breast cancer Mother   . Leukemia Brother   . Breast cancer Maternal Grandmother   . Colon cancer Neg Hx   . Esophageal cancer Neg Hx   . Rectal cancer Neg Hx   . Stomach cancer Neg Hx      ROS:  Please see the history of present illness.  General:no colds or fevers, no weight changes recent viral infection  Skin:no rashes or ulcers HEENT:no blurred vision, no congestion CV:see HPI PUL:see HPI GI:no diarrhea constipation or melena, no indigestion GU:no hematuria, no dysuria MS:no joint pain, no claudication Neuro:no syncope, no lightheadedness Endo:+ diabetes, + thyroid disease  All other ROS reviewed and negative.     Physical Exam/Data:   Vitals:   05/01/20 0451 05/01/20 0715 05/01/20 0818 05/01/20 1107  BP: 121/80  (!) 142/81 139/82  Pulse:   99 79  Resp: Temp: 98.1 F (36.7 C)  98.5 F (36.9 C) 98.6 F (37 C)  TempSrc: Oral  Oral Oral  SpO2: 100% 91% 97% 95%  Weight:      Height:       No intake or output data in the 24 hours ending 05/01/20 1335 Last 3 Weights 04/30/2020 04/30/2020 02/12/2020  Weight (lbs) 339 lb 1.1 oz 340 lb 339 lb  Weight (kg) 153.8 kg 154.223 kg 153.769 kg     Body mass index is 51.56 kg/m.  General:  Well nourished, well developed, in no acute distress, though does not feel well  HEENT: normal Lymph: no adenopathy Neck: no JVD Endocrine:  No thryomegaly Vascular: No carotid bruits; pedal pulses 2+ bilaterally   Cardiac:  normal S1, S2; RRR; no murmur muffled heart sounds  + pain to palpate Lt ant chest pain   Lungs:  clear to auscultation bilaterally, no wheezing, rhonchi or rales  Abd: soft, nontender, no hepatomegaly  Ext: no edema Musculoskeletal:  No deformities, BUE and BLE strength normal and equal pain in Lt shoulder increases when she raises  her arm.   Skin: warm and dry  Neuro:  Alert and oriented X 3 MAE follows commands, no focal abnormalities noted Psych:  Normal affect    Relevant CV Studies: nuc 2014 no ischemia  Echo 2014  Study Conclusions   - Left ventricle: The cavity size was normal. Systolic  function was normal. The estimated ejection fraction was  in the range of 50% to 55%. There is severe hypokinesis of  the basal-midinferoseptal myocardium. There was an  increased relative contribution of atrial contraction to  ventricular filling. Doppler parameters are consistent  with abnormal left ventricular relaxation (grade 1  diastolic dysfunction).  - Mitral valve: Calcified annulus.    Laboratory Data:  High Sensitivity Troponin:   Recent Labs  Lab 04/30/20 1106 04/30/20 1344 04/30/20 2109  TROPONINIHS Chemistry Recent Labs  Lab 04/30/20 1106  NA 139  K 4.0  CL 105  CO2 22  GLUCOSE 153*  BUN 12  CREATININE 1.04*  CALCIUM 8.6*  GFRNONAA 59*  ANIONGAP 12    Recent Labs  Lab 04/30/20 1106  PROT 6.7  ALBUMIN 3.4*  AST 25  ALT 24  ALKPHOS 81  BILITOT 0.4   Hematology Recent Labs  Lab 04/30/20 1106  WBC 8.1  RBC 4.23  HGB 11.6*  HCT 37.8  MCV 89.4  MCH 27.4  MCHC 30.7  RDW 14.8  PLT 337   BNPNo results for input(s): BNP, PROBNP in the last 168 hours.  DDimer No results for input(s): DDIMER in the last 168 hours.   Radiology/Studies:  CT Soft Tissue Neck W Contrast  Result Date: 04/30/2020 CLINICAL DATA:  Neck abscess, deep tissue. Additional history provided: Prior tracheostomy, swelling in neck, trouble breathing. EXAM: CT NECK WITH CONTRAST TECHNIQUE: Multidetector CT imaging of the neck was performed using the standard protocol following the bolus administration of intravenous contrast. CONTRAST:  OMNIPAQUE IOHEXOL 350 MG/ML SOLN COMPARISON:  Concurrently performed chest CT 04/30/2020. Prior neck ultrasound 08/15/2017. Prior neck CT  02/06/2017. FINDINGS: Evaluation is limited by streak and beam hardening artifact at the level of the lower neck/upper chest. Pharynx and larynx: Streak artifact from dental restoration partially obscures the oral cavity. Poor dentition with multiple carious teeth and multifocal periapical lucency. No appreciable swelling or discrete mass within the oral cavity, pharynx or larynx. Findings of possible right vocal cord paresis, unchanged. Salivary glands: No inflammation, mass, or stone. Thyroid: Unremarkable. Lymph nodes: No pathologically enlarged cervical chain lymph nodes are identified. Vascular: The major vascular structures of the neck are patent. Partially retropharyngeal course of the cervical right ICA. Limited intracranial: No acute intracranial abnormality is identified. Visualized orbits: No mass or acute finding. Mastoids and visualized paranasal sinuses: No significant paranasal sinus disease or mastoid effusion at the imaged levels. Skeleton: No acute bony abnormality or aggressive osseous lesion. Reversal of the expected cervical lordosis. C4-C5 grade 1 anterolisthesis. Cervical spondylosis without high-grade bony spinal canal stenosis. Upper chest: Reported separately. Other: Redemonstrated tracheostomy tract within the ventral lower neck. No surrounding inflammatory changes are appreciated. No surrounding fluid collection or abscess is identified. IMPRESSION: Examination limited by streak and beam hardening artifact at the level of the lower neck and upper chest. Redemonstrated tracheostomy tract within the ventral lower neck. No surrounding inflammatory changes are evident by CT. No surrounding fluid collection or abscess is identified. Electronically Signed   By: Jackey Loge DO   On: 04/30/2020 16:33   CT Angio Chest PE W/Cm &/Or Wo Cm  Result Date: 04/30/2020 CLINICAL DATA:  Neck abscess, dyspnea EXAM: CT ANGIOGRAPHY CHEST WITH CONTRAST TECHNIQUE: Multidetector CT imaging of the chest was  performed using the standard protocol during bolus administration of intravenous contrast. Multiplanar CT image reconstructions and MIPs were obtained to evaluate the vascular anatomy. CONTRAST:  OMNIPAQUE IOHEXOL 350 MG/ML SOLN COMPARISON:  09/30/2016 FINDINGS: Cardiovascular: Satisfactory opacification of the pulmonary arteries to the segmental level. No evidence of pulmonary embolism. The central pulmonary arteries are enlarged in keeping with pulmonary arterial hypertension. No significant coronary artery calcification. Normal heart size. No pericardial effusion. The thoracic aorta is unremarkable. Mediastinum/Nodes: No enlarged mediastinal, hilar, or axillary lymph nodes. Thyroid gland, trachea, and esophagus demonstrate no significant findings. Lungs/Pleura: Scattered areas of air trapping are identified, best seen within the lung apices. Mild bibasilar atelectasis. No superimposed focal pulmonary nodule or infiltrate. No pneumothorax or pleural effusion. The central airways are widely patent. Tracheostomy defect noted within the subglottic airway Upper Abdomen: No acute abnormality. Musculoskeletal: No acute bone abnormality. No lytic or blastic bone lesion. Review of the MIP images confirms the above findings. IMPRESSION: No pulmonary embolism. Mild scattered areas of air trapping possibly  related to small airways disease. No superimposed focal pulmonary infiltrate. Electronically Signed   By: Helyn Numbers MD   On: 04/30/2020 16:24   DG Chest Port 1 View  Result Date: 04/30/2020 CLINICAL DATA:  Chest pain and shortness of breath EXAM: PORTABLE CHEST 1 VIEW COMPARISON:  January 22, 2019 FINDINGS: Lungs are clear. Heart size and pulmonary vascularity are normal. No adenopathy. No pneumothorax. No bone lesions. IMPRESSION: Lungs clear.  Cardiac silhouette within normal limits. Electronically Signed   By: Bretta Bang III M.D.   On: 04/30/2020 11:17     Assessment and Plan:   #Chest pain  with neg troponins.  Pain now comes and goes, was severe this AM, + pain to palpation, could be chest wall pain or pericarditis post viral syndrome.    Risk for CAD hx of CVA, HTN HLD DM-2 and old echo with severe hypokinesis of the basal-midinferoseptal myocardium.  Would repeat echo and may need ischemic eval. Will defer to Dr. Royann Shivers may check sed rate though she does have fibromyalgia   # HTN  varies 121/80 to 142/81 on amlodipine 5, atenolol     # hx CVA X 2 last one 2016 and HLD on cholestyramine   # DM per IM   # GERD per IM  # Hyperthyroidism on methimazole   #OPD on Breo and albuterol      HEAR Score (for undifferentiated chest pain):               For questions or updates, please contact [facility] Please consult [email or web address] for contact info under    Signed, Nada Boozer, NP  05/01/2020 1:35 PM  I have seen and examined the patient along with Nada Boozer, NP .  I have reviewed the chart, notes and new data.  I agree with PA/NP's note.  Key new complaints: symptoms of chest pain are severe, but are also clearly positional and not exertion-related. Key examination changes: she is exquisitely tender to palpation over the left costochondral joints (even laying the stethoscope on that area makes her wince). Otherwise normal CV exam. Key new findings / data: reviewed her echo and nuclear stress tests from the past. I do not think that there was a true wall motion abnormality on the echo. Myocardial perfusion was normal on the nuclear test. The chest CTA performed on this admission shows no significant calcified plaque in the coronaries and only one small atherosclerotic plaque in the aortic arch. The ECG is non-ischemic. Borderline QTc is not meaningfully different from past tracings. The cardiac troponin is normal. Her total and LDL cholesterol are high. (LDL 169). She had A1c 6.6% earlier this year. Grandmother had CAD around 15  PLAN: Very low level of  suspicion for coronary syndrome. She has costochondritis that should respond to antiinflammatories. While she does not have evidence of CAD or significant PAD at this time, I do worry about her long term prognosis. She has moderately elevated LDL-C and diabetes (albeit with acceptable A1c off meds).  No further cardiac workup recommended as an inpatient. Start atorvastatin and recheck LDL in 3 months (target <100). Weight loss would be highly beneficial.  Thurmon Fair, MD, [facility] [phone] 05/01/2020, 3:54 PM

## 2020-05-02 ENCOUNTER — Encounter (HOSPITAL_COMMUNITY): Payer: Self-pay | Admitting: Family Medicine

## 2020-05-02 LAB — CBC
HCT: 36.5 % (ref 36.0–46.0)
Hemoglobin: 11.4 g/dL — ABNORMAL LOW (ref 12.0–15.0)
MCH: 27.1 pg (ref 26.0–34.0)
MCHC: 31.2 g/dL (ref 30.0–36.0)
MCV: 86.9 fL (ref 80.0–100.0)
Platelets: 328 10*3/uL (ref 150–400)
RBC: 4.2 MIL/uL (ref 3.87–5.11)
RDW: 14.7 % (ref 11.5–15.5)
WBC: 9.1 10*3/uL (ref 4.0–10.5)
nRBC: 0 % (ref 0.0–0.2)

## 2020-05-02 LAB — BASIC METABOLIC PANEL
Anion gap: 9 (ref 5–15)
BUN: 17 mg/dL (ref 6–20)
CO2: 25 mmol/L (ref 22–32)
Calcium: 9.3 mg/dL (ref 8.9–10.3)
Chloride: 108 mmol/L (ref 98–111)
Creatinine, Ser: 1.05 mg/dL — ABNORMAL HIGH (ref 0.44–1.00)
GFR, Estimated: >60 mL/min (ref >60)
Glucose, Bld: 136 mg/dL — ABNORMAL HIGH (ref 70–99)
Potassium: 3.8 mmol/L (ref 3.5–5.1)
Sodium: 142 mmol/L (ref 135–145)

## 2020-05-02 LAB — GLUCOSE, CAPILLARY
Glucose-Capillary: 120 mg/dL — ABNORMAL HIGH (ref 70–99)
Glucose-Capillary: 124 mg/dL — ABNORMAL HIGH (ref 70–99)

## 2020-05-02 LAB — HEMOGLOBIN A1C
Hgb A1c MFr Bld: 6.6 % — ABNORMAL HIGH (ref 4.8–5.6)
Mean Plasma Glucose: 143 mg/dL

## 2020-05-02 MED ORDER — DICLOFENAC SODIUM 1 % EX GEL: 2.0000 g | Freq: Four times a day (QID) | CUTANEOUS | 0 refills | Status: DC

## 2020-05-02 MED ORDER — NAPROXEN 500 MG PO TABS: 500.0000 mg | ORAL_TABLET | Freq: Two times a day (BID) | ORAL | 0 refills | Status: AC

## 2020-05-02 MED ORDER — ATORVASTATIN CALCIUM 40 MG PO TABS
40.0000 mg | ORAL_TABLET | Freq: Every day | ORAL | 0 refills | Status: DC
Start: 2020-05-03 — End: 2021-03-12

## 2020-05-02 NOTE — Progress Notes (Signed)
Discharge instructions provided to patient. All medications, follow up appointments, and discharge instructions provided. IV out. Monitor off CCMD notified. Discharging to home.  °Jalessa Peyser R Pola Furno, RN  °

## 2020-05-02 NOTE — Discharge Summary (Addendum)
Family Medicine Teaching [facility] Discharge Summary  Patient name: Yesenia [name] Medical record number: [id number] Date of birth: Feb 23, 1961 Age: 59 y.o. Gender: female Date of Admission: 04/30/2020  Date of Discharge: 05/02/2020 Admitting Physician: Cora Collum, DO  Primary Care Provider: Melene Plan, MD Consultants: Cardiology   Indication for Hospitalization: chest pain  Discharge Diagnoses/Problem List:  Chest pain  HTN HLD Normocytic anemia Type 2 DM GERD CKD stage 3 Insomnia MDD Hyperthyroidism Obstructive pulmonary disease  Disposition: home  Discharge Condition: medically stable  Discharge Exam:   Physical Exam: General: Patient sitting upright in bed, in no acute distress. Cardiovascular: RRR, no murmurs or gallops auscultated, pain reproducible to palpation, no evidence of JVD Respiratory: lungs clear to auscultation bilaterally, breathing comfortably on room air  Abdomen: soft, nontender, presence of active bowel sounds Extremities: radial and distal pulses intact bilaterally, 1+ pitting edema RLE, no LLE edema Neuro: AOx4 Psych: mood appropriate   Brief Hospital Course:  Yesenia [name] a 59 y.o.femalewho presentedwithchest pain. Pastmedical history significant ofhypertension, CVA, anemia, diabetes, hyperlipidemia, GERD, CKD 3, major depressive disorder, hyperthyroidism/Graves' disease, obstructive pulmonary disease, and obesity.  Chest pain rule out ACS  Costochondritis Patient presented with worsening chest pain that radiated down her left arm.  Troponins trended, negative x2. CXR demonstrated no acute disease. Negative respiratory panel. In the ED, received nitroglycerin which improved chest pain. CTA demonstrated no PE. CT neck showed no surrounding inflammatory changes, fluid collection or abscess. EKG remains unchanged showing no acute ST or T wave changes. Echo demonstrated EF 50-55% with severe hypokinesis of the basal  midinferoseptal myocardium, consistent with grade 1 diastolic dysfunction. Cardiology consulted and followed patient throughout hospitalization. Low concern for ACS. Likely determined to be diagnosis of costochondritis given reproducible chest pain and recent illness with cough worsening pain. Given tylenol and aspirin for pain management as needed. Atorvastatin 40 mg started for prevention and patient discharged with this, recommended to recheck LDL in 3 months. Diet and exercise counseling recommended along with healthy weight loss.  All other issues chronic and stable.    Issues for Follow Up:  1. Follow up with PCP regarding new statin started with goal LDL <100, recheck lipid panel in 3 months.  2. Follow up with PCP regarding A1c check in 3 months to determine if medication needs to be started for DM regimen.  3. Diet and exercise counseling, healthy weight loss  Significant Procedures: None  Significant Labs and Imaging:  Recent Labs  Lab 04/30/20 1106 05/02/20 0024  WBC 8.1 9.1  HGB 11.6* 11.4*  HCT 37.8 36.5  PLT 337 328   Recent Labs  Lab 04/30/20 1106 05/02/20 0024  NA 139 142  K 4.0 3.8  CL 105 108  CO2 22 25  GLUCOSE 153* 136*  BUN 12 17  CREATININE 1.04* 1.05*  CALCIUM 8.6* 9.3  ALKPHOS 81  --   AST 25  --   ALT 24  --   ALBUMIN 3.4*  --       Results/Tests Pending at Time of Discharge:  No results found.  Discharge Medications:  Allergies as of 05/02/2020      Reactions   Buprenorphine Hcl Hives   Morphine And Related Hives, Dermatitis      Medication List    STOP taking these medications   ondansetron 8 MG disintegrating tablet Commonly known as: ZOFRAN-ODT   promethazine 12.5 MG tablet Commonly known as: PHENERGAN     TAKE these medications  albuterol 108 (90 Base) MCG/ACT inhaler Commonly known as: VENTOLIN HFA Inhale 2 puffs into the lungs every 6 (six) hours as needed for wheezing or shortness of breath.   amLODipine 5 MG  tablet Commonly known as: NORVASC Take 1 tablet (5 mg total) by mouth daily.   atenolol 100 MG tablet Commonly known as: TENORMIN TAKE ONE TABLET BY MOUTH EVERY DAY FOR HIGH BLOOD PRESSURE What changed: additional instructions   atorvastatin 40 MG tablet Commonly known as: LIPITOR Take 1 tablet (40 mg total) by mouth daily.   Blood Pressure Mon/Auto/Wrist Devi 1 Units by Does not apply route daily.   Breo Ellipta 100-25 MCG/INH Aepb Generic drug: fluticasone furoate-vilanterol Inhale 1 puff into the lungs daily.   cholestyramine 4 g packet Commonly known as: Questran Use one 4 gm packed mixed into 4-6 oz of clear liquid daily. Do not take within 4 hours of any other medication   Desitin Multi-Purpose Healing Oint Apply 1 application topically 2 (two) times daily.   diclofenac Sodium 1 % Gel Commonly known as: VOLTAREN Apply 2 g topically 4 (four) times daily.   dicyclomine 10 MG capsule Commonly known as: BENTYL Take 1 capsule (10 mg total) by mouth 2 (two) times daily before a meal.   gabapentin 300 MG capsule Commonly known as: NEURONTIN Take 3 capsules (900 mg total) by mouth at bedtime.   hydrOXYzine 25 MG tablet Commonly known as: ATARAX/VISTARIL TAKE ONE TABLET BY MOUTH AT BEDTIME FOR ANXIETY WITH 50MG  OF HYDROXYZINE TO MAKE 75MG  What changed:   See the new instructions.  Another medication with the same name was removed. Continue taking this medication, and follow the directions you see here.   methimazole 10 MG tablet Commonly known as: TAPAZOLE Take 1.5 tablets (15 mg total) by mouth daily.   naproxen 500 MG tablet Commonly known as: Naprosyn Take 1 tablet (500 mg total) by mouth 2 (two) times daily with a meal for 7 days. For rib discomfort   pantoprazole 40 MG tablet Commonly known as: PROTONIX Take 1 tablet (40 mg total) by mouth 2 (two) times daily. What changed:   when to take this  reasons to take this   ramelteon 8 MG tablet Commonly  known as: ROZEREM Take 1 tablet (8 mg total) by mouth at bedtime as needed for sleep.   traZODone 50 MG tablet Commonly known as: DESYREL Take 1 tablet (50 mg total) by mouth at bedtime.       Discharge Instructions: Please refer to Patient Instructions section of EMR for full details.  Patient was counseled important signs and symptoms that should prompt return to medical care, changes in medications, dietary instructions, activity restrictions, and follow up appointments.   Follow-Up Appointments:  Follow-up Information    , MD. Go on 05/12/2020.   Specialty: Family Medicine Why: At 4:10 PM with Dr. Melene Plan for hospital follow-up Contact information: [address] [place] [address] [place] [phone]               [id number], DO 05/03/2020, 10:37 AM PGY-1, [facility] Family Medicine  05/05/2020, DO PGY-2, [facility] Family Medicine 05/05/2020 2:35 PM

## 2020-05-02 NOTE — Progress Notes (Addendum)
Family Medicine Teaching Service Daily Progress Note Intern Pager: [phone]  Patient name: Yesenia [name] Medical record number: [id number] Date of birth: [birth year] Age: 59 y.o. Gender: female  Primary Care Provider: Melene Plan, MD Consultants: Cardiology  Code Status: Full   Pt Overview and Major Events to Date:  Admitted 10/20  Assessment and Plan: Yesenia [name] a 59 y.o.female who presentedwith chest pain. Past medical history significant ofhypertension, CVA, anemia, diabetes, hyperlipidemia, GERD, CKD 3, major depressive disorder, hyperthyroidism/Graves' disease, obstructive pulmonary disease, and obesity.  Chest pain rule out ACS  Costochondritis  Patient complains of 2 weeks of worsening chest pain. Negative troponin x2, negative respiratory panel.  Chest x-ray showed no acute disease.  CT neck and CTA unremarkable.  She received aspirin and nitroglycerin with some improvement in her chest pain.  EKG unchanged. Echo demonstrated EF of 50-55% with severe hypokinesis of the basal midinferoseptal myocardium, consistent with grade 1 diastolic dysfunction. Still endorses 7/10 chest pain consistent with admission tat radiates down left arm. Also endorses pain radiating to back that started yesterday. States that back pain feels dull and achy, likely consistent with MSK strain. Low suspicion for aortic dissection given reproducible pain on exam along the left breast bone. Per cardiology, very low suspicion for ACS and likely costochondritis.  - cardiology consulted, appreciate involvement, recommends no further cardiac workup at this time. - Tylenol and aspirin for pain 650 mg prn  -started atorvastatin 40 mg daily, recommended to recheck LDL in 3 months with target <100 -diet and exercise counseling, patient would benefit from healthy weight loss   HTN Normotensive this morning at BP 119/81. -Continue home amlodipine 5mg  and atenolol 10mg    Hyperlipidemia -Continue home  cholestyramine 4g daily   Normocytic Anemia Chronic and stable at 11.4. Likely secondary to CKD stage 3. -monitor CBC  DM2 Blood glucose 136 and CBG most recently 120.  Most recent A1c 6.6, stable from last A1c 6.5, on 12/07/2018. Currently diet controlled. - gabapentin 900mg  daily  - SSI  GERD -Home protonix 40mg    CKD 3 - Creatinine stable at 1.05 and GFR >60. - continue to monitor   Insomnia I Major depressive disorder  -Continue home trazodone and hydroxyzine 75 mg  Hyperthyroidism/Graves' disease  TSH wnl - Continue home methimazole 15 mg daily  Obstructive pulmonary disease Denies any worsening dyspnea. - Continue home Breo and albuterol  FEN/GI: cardiac diet  PPx: Lovenox    Status is: Inpatient   Disposition: potentially home     Subjective:  Patient still endorses chest pain of 7/10 that radiates down left arm and to the back. She has been seen by cardiology since this started. Endorses occasional dyspnea that has not worsened. States that she feels like she is ready to go home today if medically cleared.  Objective: Temp:  [98.2 F (36.8 C)-98.6 F (37 C)] 98.2 F (36.8 C) (10/21 2254) Pulse Rate:  [60-99] 60 (10/21 2254) Resp:  [16-20] 19 (10/21 2254) BP: (116-142)/(59-82) 121/77 (10/21 2254) SpO2:  [91 %-97 %] 95 % (10/21 2254) Physical Exam: General: Patient sitting upright in bed, in no acute distress. Cardiovascular: RRR, no murmurs or gallops auscultated, pain reproducible to palpation, no evidence of JVD Respiratory: lungs clear to auscultation bilaterally, breathing comfortably on room air  Abdomen: soft, nontender, presence of active bowel sounds Extremities: radial and distal pulses intact bilaterally, 1+ pitting edema RLE, no LLE edema Neuro: AOx4 Psych: mood appropriate   Laboratory: Recent Labs  Lab 04/30/20 1106  05/02/20 0024  WBC 8.1 9.1  HGB 11.6* 11.4*  HCT 37.8 36.5  PLT 337 328   Recent Labs  Lab 04/30/20 1106  05/02/20 0024  NA 139 142  K 4.0 3.8  CL 105 108  CO2 22 25  BUN 12 17  CREATININE 1.04* 1.05*  CALCIUM 8.6* 9.3  PROT 6.7  --   BILITOT 0.4  --   ALKPHOS 81  --   ALT 24  --   AST 25  --   GLUCOSE 153* 136*      Imaging/Diagnostic Tests: No results found.  Reece Leader, DO 05/02/2020, 6:20 AM PGY-1, [facility] Family Medicine FPTS Intern pager: [phone], text pages welcome

## 2020-05-02 NOTE — Progress Notes (Signed)
Patient requested to take her morning medication at this time due to needing to take Questran medication one hour after her medication or her IBS will flair up.  Patient advised that yesterday her medication was not given how she prefers and her IBS gave her trouble all day yesterday.  RN gave 1000 medications at this time.

## 2020-05-02 NOTE — Discharge Instructions (Signed)
Dear Yesenia [name],   Thank you so much for allowing [place] to be part of your care!  You were admitted to [facility] for worsening chest pain over the last 2 weeks.  While you are here, your heart enzymes (troponin) and your EKG (looks at the electrical rhythm of your heart) were normal.  You were also evaluated by cardiology.  We believe that your pain is coming from the muscles and tissue in between your ribs that can become extremely tender and painful.   POST-HOSPITAL & CARE INSTRUCTIONS 1. Take naproxen twice daily with food to help with rib inflammation and pain control. You can also use ice or heat 20 minutes at a time on the area that hurts.  2. Please let PCP/Specialists know of any changes that were made.  3. Please see medications section of this packet for any medication changes.   DOCTOR'S APPOINTMENT & FOLLOW UP CARE INSTRUCTIONS  Future Appointments  Date Time Provider Department Center  05/12/2020  4:10 PM Melene Plan, MD [facility]    RETURN PRECAUTIONS: If your chest pain worsens associated with any shortness of breath, heart beating out of your chest, excessive sweating.   Take care and be well!  Family Medicine Teaching Service Inpatient Team [place]  [facility]  [address] [place], [place] [address] [phone]

## 2020-05-05 ENCOUNTER — Other Ambulatory Visit: Payer: Self-pay | Admitting: Family Medicine

## 2020-05-12 ENCOUNTER — Ambulatory Visit: Payer: Medicaid Other | Admitting: Family Medicine

## 2020-05-19 ENCOUNTER — Ambulatory Visit: Payer: Self-pay | Admitting: Surgery

## 2020-05-20 ENCOUNTER — Other Ambulatory Visit: Payer: Self-pay | Admitting: *Deleted

## 2020-05-22 MED ORDER — TRAZODONE HCL 50 MG PO TABS
50.0000 mg | ORAL_TABLET | Freq: Every day | ORAL | 0 refills | Status: DC
Start: 2020-05-22 — End: 2020-05-27

## 2020-05-22 NOTE — Telephone Encounter (Signed)
Patient returns call to nurse line checking status of rx refill.   Veronda Prude, RN

## 2020-05-23 ENCOUNTER — Other Ambulatory Visit: Payer: Self-pay | Admitting: Family Medicine

## 2020-05-26 ENCOUNTER — Other Ambulatory Visit: Payer: Self-pay | Admitting: Family Medicine

## 2020-05-29 ENCOUNTER — Ambulatory Visit: Payer: Medicaid Other | Admitting: Audiologist

## 2020-05-30 ENCOUNTER — Telehealth: Payer: Self-pay | Admitting: *Deleted

## 2020-05-30 NOTE — Telephone Encounter (Signed)
Received fax from [facility] pharmacy for Ondansetron HCL 8 mg tablet to be filled but did not see on current med list.Petr [employer], CMA

## 2020-06-03 ENCOUNTER — Ambulatory Visit: Payer: Medicaid Other | Admitting: Family Medicine

## 2020-06-03 MED ORDER — ONDANSETRON 8 MG PO TBDP: 8.0000 mg | ORAL_TABLET | Freq: Three times a day (TID) | ORAL | 0 refills | Status: DC | PRN

## 2020-06-03 NOTE — Addendum Note (Signed)
Addended by: Genia Hotter E on: 06/03/2020 10:16 AM   Modules accepted: Orders

## 2020-06-09 ENCOUNTER — Ambulatory Visit: Payer: Medicaid Other | Admitting: Audiologist

## 2020-06-27 ENCOUNTER — Encounter: Payer: Self-pay | Admitting: Family Medicine

## 2020-06-27 ENCOUNTER — Other Ambulatory Visit: Payer: Self-pay

## 2020-06-27 ENCOUNTER — Ambulatory Visit (INDEPENDENT_AMBULATORY_CARE_PROVIDER_SITE_OTHER): Payer: Medicaid Other | Admitting: Family Medicine

## 2020-06-27 VITALS — BP 130/70 | HR 84 | Ht 66.0 in | Wt 334.4 lb

## 2020-06-27 DIAGNOSIS — Z23 Encounter for immunization: Secondary | ICD-10-CM

## 2020-06-27 DIAGNOSIS — R1012 Left upper quadrant pain: Secondary | ICD-10-CM | POA: Diagnosis present

## 2020-06-27 MED ORDER — ONDANSETRON 8 MG PO TBDP: 8.0000 mg | ORAL_TABLET | Freq: Three times a day (TID) | ORAL | 0 refills | Status: DC | PRN

## 2020-06-27 NOTE — Progress Notes (Signed)
    SUBJECTIVE:  CHIEF COMPLAINT / HPI:   Cramping, abdominal pain. Almost every time she is bending forward, feeling a charley horse in her left abdomen. Nothing makes it feel better. It will go away on its own after about 2-3 minutes. Associated with some nausea. Biggest concern: wants to know why its happening because it can be very painful. Eating and drinking normally. No changes in bowel movement.  No other abdominal pain.    PERTINENT  PMH / PSH: graves, hepatic steatosis   OBJECTIVE:  BP 130/70   Pulse 84   Ht 5\' 6"  (1.676 m)   Wt (!) 334 lb 6.4 oz (151.7 kg)   SpO2 96%   BMI 53.97 kg/m   General: NAD, non-toxic, well-appearing, sitting comfortably in chair. Mild to moderate pain with flexing anteriorly at waist.  Abdomen:  NT, ND, soft to palpation.  Extremities: Warm and well perfused. Moving spontaneously.  Integumentary: No obvious rashes, lesions, trauma on general exam.  ASSESSMENT/PLAN:  LUQ cramping Unclear etiology. No changes in BM, no blood in BM. Pattern of pain is not consistent with bowel ischemia or other urgent abdominal problems. Patient does report recently starting cholestyramine about two months ago with great improvement in IBS symptoms. Have recommended that she speak to her GI doctor, though very unlikely due to cholestyramine. Continue to monitor. Return precautions provided. Will check electrolytes today     , MD [facility]

## 2020-06-27 NOTE — Patient Instructions (Signed)
Call the gastroenterologist to ask about your abdominal pain. It is reassuring that you are not having any other abdominal pains at this time. I will let you know if your labs return abnormal.   I have also attached Dr. Gerilyn Pilgrim, our nutritionist, for you to call to make an appointment.

## 2020-06-28 LAB — BASIC METABOLIC PANEL
BUN/Creatinine Ratio: 17 (ref 9–23)
BUN: 19 mg/dL (ref 6–24)
CO2: 19 mmol/L — ABNORMAL LOW (ref 20–29)
Calcium: 9.2 mg/dL (ref 8.7–10.2)
Chloride: 110 mmol/L — ABNORMAL HIGH (ref 96–106)
Creatinine, Ser: 1.15 mg/dL — ABNORMAL HIGH (ref 0.57–1.00)
GFR calc Af Amer: 61 mL/min/{1.73_m2} (ref >59)
GFR calc non Af Amer: 53 mL/min/{1.73_m2} — ABNORMAL LOW (ref >59)
Glucose: 149 mg/dL — ABNORMAL HIGH (ref 65–99)
Potassium: 5 mmol/L (ref 3.5–5.2)
Sodium: 144 mmol/L (ref 134–144)

## 2020-06-28 LAB — MAGNESIUM: Magnesium: 2.2 mg/dL (ref 1.6–2.3)

## 2020-06-29 ENCOUNTER — Encounter: Payer: Self-pay | Admitting: Family Medicine

## 2020-06-29 DIAGNOSIS — R1012 Left upper quadrant pain: Secondary | ICD-10-CM | POA: Insufficient documentation

## 2020-06-29 NOTE — Assessment & Plan Note (Addendum)
Unclear etiology. No changes in BM, no blood in BM. Pattern of pain is not consistent with bowel ischemia or other urgent abdominal problems. Patient does report recently starting cholestyramine about two months ago with great improvement in IBS symptoms. Have recommended that Yesenia [name] speak to her GI doctor, though very unlikely due to cholestyramine. Continue to monitor. Return precautions provided. Will check electrolytes today

## 2020-06-30 ENCOUNTER — Telehealth: Payer: Self-pay

## 2020-06-30 NOTE — Telephone Encounter (Signed)
Called patient and LVM informing patient of her 2nd covid shot on 07/21/2020 at 1530.  [name], CMA

## 2020-07-02 ENCOUNTER — Ambulatory Visit: Payer: Self-pay

## 2020-07-02 NOTE — Telephone Encounter (Signed)
Patient called stating that she had pfizer vaccine and flu vaccine on Friday 12/17 and started to have numbness and tingling down both arms on Monday 12/20.  She states that the spells come and go.  They cuse her to have arm weakness and shakiness that make her unable to do even small things with her hands.  She states that she has no other symptoms and has never had anything like this in past.  No redness or soreness to either arm She has never had a reaction to other vaccines. Per protocol patient will call her PCP for guidance.  She is aware that she should per protocol be seen today for these symptoms.  She states that she will call her PCP now.  Reason for Disposition . Sounds like a severe, unusual reaction to the triager  Answer Assessment - Initial Assessment Questions 1. MAIN CONCERN OR SYMPTOM:  "What is your main concern right now?" "What question do you have?" "What's the main symptom you're worried about?" (e.g., fever, pain, redness, swelling)     Numbness tingling in both arms 2. VACCINE: "What vaccination did you receive?" (e.g., none; AstraZeneca, J&J, Moderna, Pfizer, other) "Is this your first, second shot, or booster?" (e.g., first, second, booster)    Pfizer 3. SYMPTOM ONSET: "When did the symptoms begin?" (e.g., not relevant; hours, days)      monday 4. SYMPTOM SEVERITY: "How bad is it?"      Hands shake weakness  5. FEVER: "Is there a fever?" If Yes, ask: "What is it, how was it measured, and when did it start?"      NO 6. PAST REACTIONS: "Have you reacted to immunizations before?" If Yes, ask: "What happened?"     never 7. OTHER SYMPTOMS: "Do you have any other symptoms?"  None  Protocols used: CORONAVIRUS (COVID-19) VACCINE QUESTIONS AND REACTIONS-A-AH

## 2020-07-08 ENCOUNTER — Other Ambulatory Visit: Payer: Self-pay | Admitting: Family Medicine

## 2020-07-15 ENCOUNTER — Other Ambulatory Visit: Payer: Self-pay | Admitting: Family Medicine

## 2020-07-21 ENCOUNTER — Ambulatory Visit: Payer: Medicaid Other

## 2020-07-21 ENCOUNTER — Telehealth: Payer: Self-pay

## 2020-07-21 NOTE — Telephone Encounter (Signed)
Patient calls nurse line requesting same day appointment for left sided body numbness, starting at neck and radiating down to left foot. Patient also reports slight left sided facial drooping. Patient has history of CVA x 2. Due to recent symptoms and history, advised patient report to ED for further evaluation. Patient verbalizes understanding.   Veronda Prude, RN

## 2020-07-23 ENCOUNTER — Other Ambulatory Visit: Payer: Self-pay | Admitting: Family Medicine

## 2020-07-29 ENCOUNTER — Encounter: Payer: Self-pay | Admitting: Family Medicine

## 2020-07-29 ENCOUNTER — Other Ambulatory Visit: Payer: Self-pay | Admitting: Family Medicine

## 2020-07-29 ENCOUNTER — Other Ambulatory Visit: Payer: Self-pay

## 2020-07-29 ENCOUNTER — Telehealth: Payer: Self-pay

## 2020-07-29 ENCOUNTER — Ambulatory Visit (INDEPENDENT_AMBULATORY_CARE_PROVIDER_SITE_OTHER): Payer: Medicaid Other | Admitting: Family Medicine

## 2020-07-29 VITALS — BP 130/70 | HR 97 | Ht 68.0 in | Wt 338.0 lb

## 2020-07-29 DIAGNOSIS — B372 Candidiasis of skin and nail: Secondary | ICD-10-CM

## 2020-07-29 DIAGNOSIS — N644 Mastodynia: Secondary | ICD-10-CM | POA: Diagnosis not present

## 2020-07-29 DIAGNOSIS — L03818 Cellulitis of other sites: Secondary | ICD-10-CM | POA: Diagnosis not present

## 2020-07-29 HISTORY — DX: Mastodynia: N64.4

## 2020-07-29 MED ORDER — KETOCONAZOLE 2 % EX CREA: 1.0000 "application " | TOPICAL_CREAM | Freq: Every day | CUTANEOUS | 0 refills | Status: AC

## 2020-07-29 MED ORDER — CEPHALEXIN 500 MG PO CAPS: 500.0000 mg | ORAL_CAPSULE | Freq: Four times a day (QID) | ORAL | 0 refills | Status: AC

## 2020-07-29 NOTE — Patient Instructions (Addendum)
Start taking Keflex 4 times daily for 7 days.  This will treat for any bacterial infection. Apply ketoconazole cream twice daily to clean and dry area.  You can also continue use of Desitin. If you do not have any improvement in 3 to 5 days, please call the office. Please also go to the breast imaging center to obtain the mammogram.

## 2020-07-29 NOTE — Telephone Encounter (Signed)
Spoke with pt in lobby informed about MM on Feb. 25th at 2:20 at the [facility]. Pt understood. Aquilla Solian, CMA

## 2020-07-29 NOTE — Assessment & Plan Note (Signed)
Patient has known history of intertrigo. With the well-defined scaly border, it does appear that this is a possible fungal rash. She also has significant edema, erythema, tenderness to palpation which also raises concern for bacterial superinfection. Additionally, in the setting of acute onset of breast pain, will also obtain mammogram and ultrasound to rule out inflammatory breast cancer. -Keflex 500 mg 3 times daily x7 days -Ketoconazole, 1 application daily to dry and clean area x14 days -Schedule for mammogram and ultrasound, stat -Patient provided return precautions if no improvement with treatment as above

## 2020-07-29 NOTE — Progress Notes (Signed)
    SUBJECTIVE:  CHIEF COMPLAINT / HPI:   1. Breast pain Patient presenting today with acute onset left-sided breast pain x1 week. She reports rash underneath both breasts with worsening of the left side compared to the right side. She reports that she has had some swelling, inverted nipple, and some weeping discharge that occurred over the weekend. Patient denies any fevers, chills. Her last mammogram was in 2018 and was normal. Patient has not had any trauma or previous breast or chest wall surgery. Patient reports that this is never happened before. Patient denies any nodules or breast masses.  PERTINENT  PMH / PSH: Obesity, intertrigo  OBJECTIVE:  BP 130/70   Pulse 97   Ht 5\' 8"  (1.727 m)   Wt (!) 338 lb (153.3 kg)   SpO2 99%   BMI 51.39 kg/m   General: non-toxic appearing obese, female. Mild distress.  Derm/breast: Large well-defined rash present under bilateral breasts to bilateral axillary lines. There is more evident erythematous border present at medial aspect of breast. The border at most inferior aspect spreads 15 cm towards her umbilicus. The borders of the rash are scaly, erythematous. Within this rash, there is areas of edema on medial right breast as well as larger area of edema on left medial breast that goes laterally past the nipple. There is a peau d'orange appearance at the areas of edema. On palpation, the area is warm with significant swelling making any possible underlying nodule difficult to appreciate. She does not have any bleeding or discharge from either nipple. She has mild retraction of the left nipple.  ASSESSMENT/PLAN:  Breast pain Patient has known history of intertrigo. With the well-defined scaly border, it does appear that this is a possible fungal rash. She also has significant edema, erythema, tenderness to palpation which also raises concern for bacterial superinfection. Additionally, in the setting of acute onset of breast pain, will also obtain  mammogram and ultrasound to rule out inflammatory breast cancer. -Keflex 500 mg 3 times daily x7 days -Ketoconazole, 1 application daily to dry and clean area x14 days -Schedule for mammogram and ultrasound, stat -Patient provided return precautions if no improvement with treatment as above    , MD [facility]

## 2020-07-30 ENCOUNTER — Ambulatory Visit: Payer: Medicaid Other

## 2020-08-07 ENCOUNTER — Ambulatory Visit: Payer: Medicaid Other | Admitting: Family Medicine

## 2020-08-14 ENCOUNTER — Telehealth (INDEPENDENT_AMBULATORY_CARE_PROVIDER_SITE_OTHER): Payer: Medicaid Other | Admitting: Family Medicine

## 2020-08-14 ENCOUNTER — Other Ambulatory Visit: Payer: Self-pay

## 2020-08-14 DIAGNOSIS — G479 Sleep disorder, unspecified: Secondary | ICD-10-CM | POA: Diagnosis not present

## 2020-08-14 DIAGNOSIS — Z6841 Body Mass Index (BMI) 40.0 and over, adult: Secondary | ICD-10-CM

## 2020-08-14 NOTE — Progress Notes (Signed)
Telehealth Encounter I connected with Yesenia [name] (MRN [id number]) on 08/14/2020 by MyChart, along with [name], PhD, RD, LDN. I verified that I am speaking with the correct person using two identifiers, and that the patient was in a private environment conducive to confidentiality. The patient agreed to proceed.  Persons participating in visit were patient; [name], PhD, RD, LDN; and provider Dollene Cleveland. Provider and Dr. Gerilyn Pilgrim were located at [facility] during this telehealth encounter; patient was at home.  Appt start time: 1030 end time: 1100 (30 minutes)  Reason for telehealth visit: "I have a couple of medical problems like Graves Disease, Diabetes, IBS, a bad knee", patient desires better eating habits  Relevant history/background: Graves Disease, IBS, T2DM, Obesity, Difficulty Sleeping, BP meds  Assessment:  Usual eating pattern: 1 meal and 2 snacks per day Frequent foods and beverages: ginger ale, sandwiches, hotdogs    Usual physical activity: walk up and down hallway of her apartment at least once daily, takes about 5 minutes Sleep: Patient estimates average of 5 hours of sleep at night, she has disordered sleeping and sleeps frequently during the day  24-hr recall:  (0500 02/02) - Graves Disease medicine, went to sleep (1000) Woke up (11:45 AM) - IBS medicine, water (1230) - Ginger Ale (Snack 1400) - Strawberries, water (1700) - Water; Sugar free Welch[employer] Grape drink (0100-0200 AM on 2/03) - Bedtime Typical day? Yes.     Intervention: Completed diet and exercise history, and established: - SMART behavioral goals (Smart Goals: Specific, Measurable, Action-oriented, Realistic, Time-based.)   - Method of documenting progress.  Was not discussed with the patient   1. Wake up no later than 10am  2. Obtain at least 3 real meals per day, breakfast, lunch and dinner, starting Friday 02/04  For recommendations and goals, see Patient  Instructions.    Follow-up: Dr. Gerilyn Pilgrim to email regarding availability and follow up   Dollene Cleveland

## 2020-08-14 NOTE — Patient Instructions (Addendum)
1. Wake up no later than 10am  - Set alarm for 10am each morning, putting phone/clocl on other side of the room so that you have to get out of bed to turn the alarm off. - Move wake-up time up by 15-minutes each week until you are waking up at 9:00am. - Shut off electronics by 10pm - this includes phones, TVs, tablets, etc. - Put your phone on "Do Not Disturb" between 10pm and 9 or 10am. Ask you daughter for help setting this up! - Avoid napping during the day, especially after 3pm. You can do this by walking when you feel sleepy.   2. Obtain at least 3 real meals per day, breakfast, lunch and dinner - Lunch and dinner should include a protein food, a starch food, and vegetables.  - Protein foods: meat, fish, poultry, eggs, yoghurt, other dairy, and beans - Starch foods: bread, cereal, rice, pasta, potatoes, corn, baked goods  Plan to write down foods eaten and exercise in a journal.   Follow up: Dr. Gerilyn Pilgrim will email appointment times available.   Peggyann Shoals, DO [facility], PGY-3 08/14/2020 11:13 AM

## 2020-08-15 ENCOUNTER — Other Ambulatory Visit: Payer: Self-pay | Admitting: *Deleted

## 2020-08-15 MED ORDER — ONDANSETRON 8 MG PO TBDP: 8.0000 mg | ORAL_TABLET | Freq: Three times a day (TID) | ORAL | 0 refills | Status: DC | PRN

## 2020-09-01 ENCOUNTER — Other Ambulatory Visit: Payer: Self-pay | Admitting: Family Medicine

## 2020-09-05 ENCOUNTER — Other Ambulatory Visit: Payer: Self-pay

## 2020-09-05 ENCOUNTER — Ambulatory Visit
Admission: RE | Admit: 2020-09-05 | Discharge: 2020-09-05 | Disposition: A | Discharge status: Home / Self Care | Payer: Medicaid Other | Source: Ambulatory Visit | Attending: Family Medicine | Admitting: Family Medicine

## 2020-09-05 DIAGNOSIS — N644 Mastodynia: Secondary | ICD-10-CM

## 2020-09-08 ENCOUNTER — Other Ambulatory Visit: Payer: Self-pay

## 2020-09-08 ENCOUNTER — Other Ambulatory Visit: Payer: Self-pay | Admitting: Family Medicine

## 2020-09-08 MED ORDER — ATENOLOL 100 MG PO TABS: 100.0000 mg | ORAL_TABLET | Freq: Every day | ORAL | 0 refills | Status: DC

## 2020-09-08 NOTE — Telephone Encounter (Signed)
Pharmacy calls nurse line stating that prescribing doctor is not covered under Medicaid. Forwarding to attending provider.   Veronda Prude, RN

## 2020-09-08 NOTE — Addendum Note (Signed)
Addended by: Veronda Prude on: 09/08/2020 11:37 AM   Modules accepted: Orders

## 2020-09-09 ENCOUNTER — Other Ambulatory Visit: Payer: Self-pay | Admitting: Family Medicine

## 2020-09-09 MED ORDER — TRAZODONE HCL 50 MG PO TABS
50.0000 mg | ORAL_TABLET | Freq: Every day | ORAL | 0 refills | Status: DC
Start: 2020-09-09 — End: 2020-11-07

## 2020-09-11 ENCOUNTER — Other Ambulatory Visit: Payer: Self-pay | Admitting: Family Medicine

## 2020-09-11 NOTE — Telephone Encounter (Signed)
Patient calls nurse line stating that she needs a refill on hydroxyzine 50 mg. Patient states that she is supposed to be taking a total of 75 mg hydroxyzine daily. However, rx has to be split into 50 mg and 25 mg tablets. Patient reports that she has the 25 mg, she just needs the refill on the 50 mg tablets.   Please advise.   Veronda Prude, RN

## 2020-10-02 ENCOUNTER — Telehealth: Payer: Self-pay | Admitting: Nurse Practitioner

## 2020-10-02 MED ORDER — CHOLESTYRAMINE 4 G PO PACK
PACK | ORAL | 2 refills | Status: DC
Start: 2020-10-02 — End: 2021-04-03

## 2020-10-02 NOTE — Telephone Encounter (Signed)
Pt needs rf for Cholestyramine sent to [facility] in [place].

## 2020-10-02 NOTE — Telephone Encounter (Signed)
RX sent

## 2020-10-07 ENCOUNTER — Other Ambulatory Visit: Payer: Self-pay | Admitting: Student in an Organized Health Care Education/Training Program

## 2020-10-07 ENCOUNTER — Other Ambulatory Visit: Payer: Self-pay | Admitting: Family Medicine

## 2020-10-10 ENCOUNTER — Ambulatory Visit (INDEPENDENT_AMBULATORY_CARE_PROVIDER_SITE_OTHER): Payer: Medicaid Other

## 2020-10-10 ENCOUNTER — Encounter: Payer: Self-pay | Admitting: Family Medicine

## 2020-10-10 ENCOUNTER — Ambulatory Visit: Payer: Medicaid Other | Admitting: Family Medicine

## 2020-10-10 ENCOUNTER — Ambulatory Visit (INDEPENDENT_AMBULATORY_CARE_PROVIDER_SITE_OTHER): Payer: Medicaid Other | Admitting: Family Medicine

## 2020-10-10 ENCOUNTER — Other Ambulatory Visit: Payer: Self-pay

## 2020-10-10 VITALS — BP 112/60 | HR 76 | Ht 68.0 in | Wt 341.1 lb

## 2020-10-10 DIAGNOSIS — N644 Mastodynia: Secondary | ICD-10-CM

## 2020-10-10 DIAGNOSIS — Z23 Encounter for immunization: Secondary | ICD-10-CM | POA: Diagnosis not present

## 2020-10-10 DIAGNOSIS — M25521 Pain in right elbow: Secondary | ICD-10-CM | POA: Diagnosis not present

## 2020-10-10 NOTE — Patient Instructions (Signed)
For your elbow pain, continue to rest it works comfortable.  You can also try elevation, heat, ice, Tylenol as needed.  We also noticed that your shoulder had pretty limited range of motion due to pain.  I would like you to go and see the sports medicine doctors for any suggestions as you have difficulty using your right arm overall.  They can also ultrasound your elbow to see if there are any issues there as well if that does not resolve soon.

## 2020-10-11 ENCOUNTER — Encounter: Payer: Self-pay | Admitting: Family Medicine

## 2020-10-11 DIAGNOSIS — M25521 Pain in right elbow: Secondary | ICD-10-CM

## 2020-10-11 HISTORY — DX: Pain in right elbow: M25.521

## 2020-10-11 NOTE — Progress Notes (Signed)
    SUBJECTIVE:  CHIEF COMPLAINT / HPI:   1. Right elbow pain Patient reports right elbow pain started a few weeks ago. The pain is located on anterior aspect in the cubital fossa, as well as directly proximal in distal humerus. Pain is decribed as throbbing. She reports that she was not doing anything when the pain started. Pain is exacerbated by repetitive movements with sweeping and vacuuming. She reports that she is ambidextrious. She reports that the fossa has been swollen compared to her left and TTP. She deneis any trauma. She has not tried anything for the pain.   2. Breast abnormality  Patient reports that her breasts are assymetric. She was seen a few months ago for breast rash, which has since cleared up. She reports pain in her breast when she rests her arms on them, but improves when weight is lifted off. She wants to make sure everything is okay.   PERTINENT  PMH / PSH: hyperthyroidism, neuropathy DM II   OBJECTIVE:  BP 112/60   Pulse 76   Ht 5\' 8"  (1.727 m)   Wt (!) 341 lb 2 oz (154.7 kg)   SpO2 98%   BMI 51.87 kg/m   General: well appearing female, NAD Breast: On inspection, no abnormalities in the overall shape of breasts, changes in skin color, skin dimpling or retractions appreciated with patient sitting up, spontaneous nipple discharge.  On palpation, no tenderness to palpation, nodular findings while sitting up or supine. Lymph node exam of infraclavicular, axillary and supraclavicular areas without abnormalities.  UE: No gross abnormality bilaterally, elbows appear symmetric. No TTP of medial/lateral epicondyle bilaterally. TTP of right cubital fossa as well as distal anterior humerus. No obvious swelling or warmth appreciated. Full ROM. Strength 5/5 elbow, wrist, hands bilaterally. Limited ROM of right shoulder, unable to bring right arm behind back, cannot abduct past 90 degrees. neurovascular intact.   ASSESSMENT/PLAN:  Breast pain Provided reassurance for breast  pain, likely due to bearing weight of upper extremity, as well as reassurance for asymetry today. Recent mammogram reassuring.   Right elbow pain Unclear etiology of elbow pain. Physical exam limited by patient's body habitus, but generally normal though she does have TTP of cubital fossa. No recent trauma and no repetitive movements reported, except for short periods of cleaning. Unlikely infection. Possibly exacerbation of chronic pain syndrome versus muscle strain? Will treat with rest, ice, elevation as able. If no improvement with conservative management, recommend patient follow up with sports medicine for possible ultrasound.  Patient with moderately limited ROM of right shoulder, which I have suggested she see sports medicine for as she reports limitation of daily activities due to this. Have also referred to physical therapy.     , MD [facility]

## 2020-10-11 NOTE — Assessment & Plan Note (Signed)
Unclear etiology of elbow pain. Physical exam limited by patient's body habitus, but generally normal though she does have TTP of cubital fossa. No recent trauma and no repetitive movements reported, except for short periods of cleaning. Unlikely infection. Possibly exacerbation of chronic pain syndrome versus muscle strain? Will treat with rest, ice, elevation as able. If no improvement with conservative management, recommend patient follow up with sports medicine for possible ultrasound.  Patient with moderately limited ROM of right shoulder, which I have suggested she see sports medicine for as she reports limitation of daily activities due to this. Have also referred to physical therapy.

## 2020-10-11 NOTE — Assessment & Plan Note (Signed)
Provided reassurance for breast pain, likely due to bearing weight of upper extremity, as well as reassurance for asymetry today. Recent mammogram reassuring.

## 2020-10-13 ENCOUNTER — Other Ambulatory Visit: Payer: Self-pay | Admitting: Family Medicine

## 2020-10-13 DIAGNOSIS — L905 Scar conditions and fibrosis of skin: Secondary | ICD-10-CM
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
## 2020-10-15 ENCOUNTER — Ambulatory Visit: Payer: Medicaid Other | Admitting: Family Medicine

## 2020-10-20 ENCOUNTER — Other Ambulatory Visit: Payer: Self-pay | Admitting: Family Medicine

## 2020-10-20 ENCOUNTER — Telehealth: Payer: Self-pay | Admitting: Emergency Medicine

## 2020-10-20 NOTE — Telephone Encounter (Signed)
Called and spoke with pt who states she has had complaints of wheezing which she has had since last OV with our office that still has not gone away. Pt has been taking breo as prescribed.  Pt states the wheezing is worse as it is louder than it was at last visit. Pt states she has had the cough and chest congestion about 2 weeks now and states she is coughing up white phlegm that is sometimes yellow.  Pt has had 2 of the 3 covid vaccines. Pt denies any complaints of fever, has not checked temp but does not feel feverish.  Pt has tried Dayquil as well as Theraflu which neither of them are working to help with her symptoms. Pt is using her rescue inhaler at least 3-4 times daily.  Due to it being a little over a year since pt's last visit, stated to pt that we would need to schedule OV prior to [place] able to send meds in for her and she verbalized understanding. appt scheduled for pt 4/14. Nothing further needed.

## 2020-10-23 ENCOUNTER — Ambulatory Visit: Payer: Medicaid Other | Admitting: Adult Health

## 2020-10-29 ENCOUNTER — Ambulatory Visit: Payer: Medicaid Other | Admitting: Family Medicine

## 2020-11-06 ENCOUNTER — Telehealth (INDEPENDENT_AMBULATORY_CARE_PROVIDER_SITE_OTHER): Payer: Medicaid Other | Admitting: Family Medicine

## 2020-11-06 ENCOUNTER — Other Ambulatory Visit: Payer: Self-pay

## 2020-11-06 DIAGNOSIS — Z6841 Body Mass Index (BMI) 40.0 and over, adult: Secondary | ICD-10-CM | POA: Diagnosis not present

## 2020-11-06 DIAGNOSIS — Z7689 Persons encountering health services in other specified circumstances: Secondary | ICD-10-CM

## 2020-11-06 NOTE — Progress Notes (Addendum)
Telehealth Video Encounter I connected with Yesenia [name] (MRN [id number]) on 11/06/2020 by Video, along with [name], PhD, RD, LDN. I verified that I am speaking with the correct person using two identifiers, and that the patient was in a private environment conducive to confidentiality. The patient agreed to proceed.  Persons participating in visit were patient; [name], PhD, RD, LDN; and provider Joana Reamer. Provider and Dr. Gerilyn Pilgrim were located at [facility] during this Video telehealth encounter; patient was at home.  Appt start time: 1100 end time: 11:45 (45 minutes)  Reason for telehealth visit: Follow up nutrition.   Relevant history/background: Graves Disease, IBS, T2DM, Obesity, Difficulty Sleeping, BP meds Decreased appetite so eating more fruit  "I have a gastro problem so I am limited on what I can eat"  Assessment:  Usual eating pattern: 1 meal evening and 2 snacks per day Frequent foods and beverages: fruit, nuts, diet ginger ale, soup, sandwiches (PB)  Usual physical activity: Walk around grocery store 3x/week for a short time but then use electric cart, takes about 5 minutes Sleep: Patient estimates average of 9-10 hours of sleep at night (bed by 10pm and up by 8-9am)  24-hr recall:  (Up at 9 AM) B (10:15 AM)-  Cheese croissant  Snk ( AM)-   L ( PM)-   Snk ( PM)-   D (5 PM)-  1/2 C Spanish rice, 1 chicken enchilada from restaurant, chicken burrito, lettuce, 1tsp of sour cream Snk (9 PM)-  Diet ginger ail and strawberries (2-3 C) Typical day? Yes.     Snack: 2-3C strawberries, Walnuts 1C, or 3C of grapes or cantaloupe   Intervention: Completed diet and exercise history, and established: - SMART behavioral goals (Smart Goals: Specific, Measurable, Action-oriented, Realistic, Time-based.)   - Method of documenting progress.  Was not discussed with the patient   1. I will eat a source of protein with each meal 2. I will make a list  of new vegetables and protein sources that I am willing to try and I will try a new vegetable or protein source 3 times a week 3. Obtain at least 3 real meals per day, breakfast, lunch and dinner  For recommendations and goals, see Patient Instructions.    Follow-up: 12/11/20 at 11:00am via virtual   [employer]

## 2020-11-06 NOTE — Patient Instructions (Addendum)
Goals:  1. I will eat a source of protein with each meal 2. I will make a list of new vegetables and protein sources that I am willing to try and I will try a new vegetable or protein source 3 times a week 3. I will have at least 3 real meals per day, breakfast, lunch and dinner 4. I will continue to walk as much as I can   Follow up on 12/11/20 at 11:00am via Virtual appointment for nutrition follow up

## 2020-11-07 ENCOUNTER — Other Ambulatory Visit: Payer: Self-pay | Admitting: Student in an Organized Health Care Education/Training Program

## 2020-11-12 ENCOUNTER — Other Ambulatory Visit: Payer: Self-pay | Admitting: Family Medicine

## 2020-11-13 NOTE — Telephone Encounter (Signed)
This patient follows with Dr. Sharl Ma (endocrinology) and should get thyroid medication from him. Can you call and ask the patient if she is getting this rx filled from both of [place]? Either way, she should be requesting this medicaiton with him.

## 2020-12-02 ENCOUNTER — Other Ambulatory Visit: Payer: Self-pay | Admitting: *Deleted

## 2020-12-03 ENCOUNTER — Other Ambulatory Visit: Payer: Self-pay

## 2020-12-03 MED ORDER — HYDROXYZINE HCL 25 MG PO TABS: 25.0000 mg | ORAL_TABLET | Freq: Four times a day (QID) | ORAL | 0 refills | Status: DC | PRN

## 2020-12-03 MED ORDER — HYDROXYZINE HCL 50 MG PO TABS: 75.0000 mg | ORAL_TABLET | Freq: Every day | ORAL | 0 refills | Status: DC

## 2020-12-03 MED ORDER — TRAZODONE HCL 50 MG PO TABS: 50.0000 mg | ORAL_TABLET | Freq: Every day | ORAL | 0 refills | Status: DC

## 2020-12-05 NOTE — Telephone Encounter (Signed)
Opened in error.Naela Nodal Zimmerman Rumple, CMA  

## 2020-12-11 ENCOUNTER — Other Ambulatory Visit: Payer: Self-pay

## 2020-12-11 ENCOUNTER — Telehealth: Payer: Medicaid Other | Admitting: Family Medicine

## 2020-12-11 ENCOUNTER — Other Ambulatory Visit: Payer: Self-pay | Admitting: Family Medicine

## 2020-12-17 ENCOUNTER — Encounter: Payer: Self-pay | Admitting: Plastic Surgery

## 2020-12-17 ENCOUNTER — Ambulatory Visit (INDEPENDENT_AMBULATORY_CARE_PROVIDER_SITE_OTHER): Payer: Medicaid Other | Admitting: Plastic Surgery

## 2020-12-17 ENCOUNTER — Other Ambulatory Visit: Payer: Self-pay

## 2020-12-17 VITALS — BP 120/85 | HR 92 | Ht 67.0 in | Wt 340.0 lb

## 2020-12-17 DIAGNOSIS — L905 Scar conditions and fibrosis of skin: Secondary | ICD-10-CM
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
## 2020-12-18 ENCOUNTER — Encounter: Payer: Medicaid Other | Admitting: Family Medicine

## 2020-12-18 ENCOUNTER — Encounter: Payer: Self-pay | Admitting: Family Medicine

## 2020-12-18 NOTE — Progress Notes (Deleted)
Telehealth Video Encounter I connected with Yesenia [name] (MRN [id number]) on 12/18/2020 by Video, along with [name], PhD, RD, LDN. I verified that I am speaking with the correct person using two identifiers, and that the patient was in a private environment conducive to confidentiality. The patient agreed to proceed.  Persons participating in visit were patient; [name], PhD, RD, LDN; and provider [facility] Simmons-Robinson. Provider and Dr. Gerilyn Pilgrim were located at [facility] during this Video telehealth encounter; patient was at home.  Appt start time: 1100 end time: 11:45 (45 minutes)  Reason for telehealth visit: Follow up nutrition.   Relevant history/background: Graves Disease, IBS, T2DM, Obesity, Difficulty Sleeping, BP meds Decreased appetite so eating more fruit  "I have a gastro problem so I am limited on what I can eat"  Assessment:  Recent eating pattern: 1 meal evening and 2 snacks per day Frequent foods and beverages: fruit, nuts, diet ginger ale, soup, sandwiches (PB)  Usual physical activity: Walk around grocery store 3x/week for a short time but then use electric cart, takes about 5 minutes Sleep: Patient estimates average of 9-10 hours of sleep at night (bed by 10pm and up by 8-9am)  24-hr recall:  (Up at 9 AM) B (10:15 AM)-  Cheese croissant  Snk ( AM)-   L ( PM)-   Snk ( PM)-   D (5 PM)-  1/2 C Spanish rice, 1 chicken enchilada from restaurant, chicken burrito, lettuce, 1tsp of sour cream Snk (9 PM)-  Diet ginger ail and strawberries (2-3 C) Typical day? Yes.     Snack: 2-3C strawberries, Walnuts 1C, or 3C of grapes or cantaloupe   Intervention: Completed diet and exercise history, and established: - SMART behavioral goals (Smart Goals: Specific, Measurable, Action-oriented, Realistic, Time-based.)   - Method of documenting progress.  Was not discussed with the patient   1. I will eat a source of protein with each meal 2. I will make  a list of new vegetables and protein sources that I am willing to try and I will try a new vegetable or protein source 3 times a week 3. Obtain at least 3 real meals per day, breakfast, lunch and dinner  For recommendations and goals, see Patient Instructions.    Follow-up: ***  [name]

## 2020-12-19 ENCOUNTER — Other Ambulatory Visit: Payer: Self-pay | Admitting: Student in an Organized Health Care Education/Training Program

## 2020-12-19 NOTE — Progress Notes (Signed)
Error

## 2020-12-26 ENCOUNTER — Encounter: Payer: Self-pay | Admitting: Family Medicine

## 2020-12-26 NOTE — Progress Notes (Unsigned)
Received surgical clearance form from Dr. Thomos Lemons office for reconstruction surgery.   Patient will need to be seen in clinic for evaluation. Will forward to front desk for appointment. Please ask the patient to schedule as soon as possible, just in case further work up needs to be done which may prevent delay in surgery.  The paperwork in is my physical inbox.   Melene Plan, M.D.  1:37 PM 12/26/2020

## 2020-12-29 NOTE — Progress Notes (Signed)
Called and scheduled appointment for clearance

## 2020-12-30 ENCOUNTER — Other Ambulatory Visit: Payer: Self-pay | Admitting: Family Medicine

## 2021-01-05 NOTE — Progress Notes (Deleted)
  Patient Name: Yesenia [name] Date of Birth: 1960/12/21 Date of Visit: 01/05/21 PCP: Melene Plan, MD  Chief Complaint: preoperative evaluation Surgeon:  *** Surgery: *** Date of Procedure: ***  Subjective: Yesenia [name] is a pleasant 60 y.o. with medical history significant for *** presenting today for ***.    The patient can walk ***city blocks and or up 2 flights of stairs without difficulty. ***   Prior surgeries: *** Difficulty with surgical procedures in the past: *** History of difficulty with anesthesia: *** History of venous thromboembolic disease: *** History of easy bleeding with procedures: *** Family history of venous thromboembolic disease:*** Family history of bleeding diathesis:***    ROS:  ROS Denies chest pain, dyspnea on exertion, chronic cough, history of venous thromboembolism, family history of venous thromboembolism, personal or family history of easy bleeding or bruising, or personal history or family history of difficulty with anesthesia.  I have reviewed the patient's medical, surgical, family, and social history as appropriate.   There were no vitals filed for this visit. There were no vitals filed for this visit. ***  There are no diagnoses linked to this encounter.  Patient is ***risk for a ***risk surgery.  The patient has been medically optimized for this procedure. In addition,  I make the following recommendations for further evaluation and medication adjustments prior to their  planned procedure: ***    Melene Plan, M.D.  3:32 PM 01/05/2021

## 2021-01-06 ENCOUNTER — Ambulatory Visit: Payer: Medicaid Other | Admitting: Family Medicine

## 2021-01-14 ENCOUNTER — Other Ambulatory Visit: Payer: Self-pay | Admitting: Family Medicine

## 2021-01-19 ENCOUNTER — Ambulatory Visit (INDEPENDENT_AMBULATORY_CARE_PROVIDER_SITE_OTHER): Payer: Medicaid Other | Admitting: Family Medicine

## 2021-01-19 ENCOUNTER — Encounter: Payer: Self-pay | Admitting: Family Medicine

## 2021-01-19 ENCOUNTER — Other Ambulatory Visit: Payer: Self-pay

## 2021-01-19 VITALS — BP 117/76 | HR 106 | Ht 67.0 in | Wt 334.4 lb

## 2021-01-19 DIAGNOSIS — E1142 Type 2 diabetes mellitus with diabetic polyneuropathy: Secondary | ICD-10-CM

## 2021-01-19 LAB — POCT GLYCOSYLATED HEMOGLOBIN (HGB A1C): HbA1c, POC (controlled diabetic range): 7 % (ref 0.0–7.0)

## 2021-01-19 NOTE — Patient Instructions (Signed)
It was good to see you today.  Thank you for coming in.  I think you are low risk to have this surgery.  Please avoid taking your IBS medication Cholestyramine on the day of your Surgery.  You can restart this medication the day after.  Please follow-up with [place] in 1 month.  Be Well, Dr Pecola Leisure

## 2021-01-20 NOTE — Assessment & Plan Note (Addendum)
A1C elevated at 7.0.  Recommended patient follow-up with Dr Larita Fife in 1 month for discussion on medication management vs lifestyle management. - follow up with PCP in 1 month
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
## 2021-01-22 ENCOUNTER — Other Ambulatory Visit: Payer: Self-pay

## 2021-01-22 ENCOUNTER — Ambulatory Visit (INDEPENDENT_AMBULATORY_CARE_PROVIDER_SITE_OTHER): Payer: Medicaid Other

## 2021-01-22 VITALS — BP 145/86 | HR 99 | Ht 67.0 in | Wt 334.0 lb

## 2021-01-22 DIAGNOSIS — L905 Scar conditions and fibrosis of skin: Secondary | ICD-10-CM

## 2021-01-26 ENCOUNTER — Other Ambulatory Visit: Payer: Self-pay

## 2021-01-26 DIAGNOSIS — R110 Nausea: Secondary | ICD-10-CM

## 2021-01-28 NOTE — Telephone Encounter (Signed)
Spoke with patient. She stated that she was seen by Dr. Pecola Leisure on 7/11 and that he went over her medication then. Patient stated that she is always have recurring nausea and wanted to know if she can just have her medications refilled. Aquilla Solian, CMA

## 2021-01-29 MED ORDER — ONDANSETRON 8 MG PO TBDP: 8.0000 mg | ORAL_TABLET | Freq: Three times a day (TID) | ORAL | 0 refills | Status: DC | PRN

## 2021-01-29 NOTE — Addendum Note (Signed)
Addended by: Valetta Close on: 01/29/2021 09:05 AM   Modules accepted: Orders

## 2021-01-29 NOTE — Telephone Encounter (Signed)
Refill for zofran. Patient will need EKG prior to next refill to ensure normal QTc, last was fall 2021.  Fayette Pho, MD

## 2021-01-30 ENCOUNTER — Telehealth: Payer: Self-pay | Admitting: *Deleted

## 2021-01-30 NOTE — Telephone Encounter (Signed)
Contacted pt and informed her that a refill was sent in for 20 tabs and that PCP would like for her to schedule a visit to see if they can figure out why she has the nausea all the time.  Also she is still going to her endocrinologist and I told her that he should be filling her thyroid medicine and she said she remembered the doctor saying something about that, she said she would call back to schedule and appointment.Nicholus Chandran Zimmerman Rumple, CMA

## 2021-01-30 NOTE — Telephone Encounter (Signed)
-----   Message from Fayette Pho, MD sent at 01/30/2021 11:22 AM EDT ----- Regarding: FW: Please call and schedule  ----- Message ----- From: Osborne Oman, CMA Sent: 01/28/2021  12:45 PM EDT To: Fayette Pho, MD, [facility] Admin Subject: RE: Please call and schedule                   Everett Graff,   I was helping admin with their in-basket and noticed this.    Anything (call wise) that could result in medical questions from the patient, such as this one, should be sent to your team.   Other messages such as can you call this patient to remind them to make an appt etc are perfectly fine to go admin :-)   ----- Message ----- From: Fayette Pho, MD Sent: 01/26/2021   6:38 PM EDT To: Bevelyn Ngo Admin Subject: Please call and schedule                       Please call and schedule this patient. She needs a visit to address her nausea. She requested a refill of quite a number of zofran tabs. We need to get to the bottom of the nausea. If we're going to keep refilling it, she'll need an EKG regularly.   Also, please ask her if she is still seeing her endocrinologist. She needs to be getting her thyroid meds from them.   [employer]

## 2021-02-05 ENCOUNTER — Other Ambulatory Visit: Payer: Self-pay

## 2021-02-05 ENCOUNTER — Encounter (HOSPITAL_COMMUNITY): Payer: Self-pay | Admitting: Plastic Surgery

## 2021-02-05 NOTE — Progress Notes (Signed)
Spoke with pt for pre-op call. Pt has hx of HTN and TIA's. No lasting weakness or paralysis. Pt is a type 2 diabetes. Has never taken medication for it and does not check her blood sugar at home. Last A1C was 7.0 on 01/19/21.   Pt will need Covid test on arrival day of surgery. Pt uses Medical transportation and can't get there tomorrow. I instructed pt to arrive Monday at 6:30 AM so we can do the test prior to surgery. She states Medical transportation was bringing her at 8 AM. I asked her to please call and see if they can get her here by 6:30 or the latest 7:00 AM.   Pt did call me back and she states that the medical transportation will get her here before 7 AM.

## 2021-02-05 NOTE — Telephone Encounter (Signed)
Patient has appt on 8/16. Aquilla Solian, CMA

## 2021-02-09 ENCOUNTER — Ambulatory Visit (HOSPITAL_COMMUNITY): Payer: Medicaid Other | Admitting: Anesthesiology

## 2021-02-09 ENCOUNTER — Other Ambulatory Visit: Payer: Self-pay

## 2021-02-09 ENCOUNTER — Encounter (HOSPITAL_COMMUNITY)
Admission: RE | Disposition: A | Discharge status: Home / Self Care | Payer: Self-pay | Source: Home / Self Care | Attending: Plastic Surgery

## 2021-02-09 ENCOUNTER — Ambulatory Visit (HOSPITAL_COMMUNITY)
Admission: RE | Admit: 2021-02-09 | Discharge: 2021-02-09 | Disposition: A | Discharge status: Home / Self Care | Payer: Medicaid Other | Attending: Plastic Surgery | Admitting: Plastic Surgery

## 2021-02-09 DIAGNOSIS — I129 Hypertensive chronic kidney disease with stage 1 through stage 4 chronic kidney disease, or unspecified chronic kidney disease: Secondary | ICD-10-CM | POA: Diagnosis not present

## 2021-02-09 DIAGNOSIS — E785 Hyperlipidemia, unspecified: Secondary | ICD-10-CM | POA: Insufficient documentation

## 2021-02-09 DIAGNOSIS — Z886 Allergy status to analgesic agent status: Secondary | ICD-10-CM | POA: Diagnosis not present

## 2021-02-09 DIAGNOSIS — G4733 Obstructive sleep apnea (adult) (pediatric): Secondary | ICD-10-CM | POA: Diagnosis not present

## 2021-02-09 DIAGNOSIS — Z20822 Contact with and (suspected) exposure to covid-19: Secondary | ICD-10-CM | POA: Diagnosis not present

## 2021-02-09 DIAGNOSIS — X58XXXA Exposure to other specified factors, initial encounter: Secondary | ICD-10-CM | POA: Insufficient documentation

## 2021-02-09 DIAGNOSIS — Z90721 Acquired absence of ovaries, unilateral: Secondary | ICD-10-CM | POA: Diagnosis not present

## 2021-02-09 DIAGNOSIS — Z930 Tracheostomy status: Secondary | ICD-10-CM | POA: Insufficient documentation

## 2021-02-09 DIAGNOSIS — Z8673 Personal history of transient ischemic attack (TIA), and cerebral infarction without residual deficits: Secondary | ICD-10-CM | POA: Insufficient documentation

## 2021-02-09 DIAGNOSIS — Z885 Allergy status to narcotic agent status: Secondary | ICD-10-CM | POA: Insufficient documentation

## 2021-02-09 DIAGNOSIS — T2007XA Burn of unspecified degree of neck, initial encounter: Secondary | ICD-10-CM | POA: Insufficient documentation

## 2021-02-09 DIAGNOSIS — N182 Chronic kidney disease, stage 2 (mild): Secondary | ICD-10-CM | POA: Insufficient documentation

## 2021-02-09 DIAGNOSIS — L905 Scar conditions and fibrosis of skin: Secondary | ICD-10-CM

## 2021-02-09 DIAGNOSIS — E1122 Type 2 diabetes mellitus with diabetic chronic kidney disease: Secondary | ICD-10-CM | POA: Diagnosis not present

## 2021-02-09 HISTORY — DX: Personal history of urinary calculi: Z87.442

## 2021-02-09 HISTORY — DX: Pneumonia, unspecified organism: J18.9

## 2021-02-09 HISTORY — PX: SKIN FULL THICKNESS GRAFT: SHX442

## 2021-02-09 HISTORY — DX: Gastro-esophageal reflux disease without esophagitis: K21.9

## 2021-02-09 HISTORY — DX: Thyrotoxicosis, unspecified without thyrotoxic crisis or storm: E05.90

## 2021-02-09 LAB — BASIC METABOLIC PANEL WITH GFR
Anion gap: 10 (ref 5–15)
BUN: 17 mg/dL (ref 6–20)
CO2: 23 mmol/L (ref 22–32)
Calcium: 9.3 mg/dL (ref 8.9–10.3)
Chloride: 107 mmol/L (ref 98–111)
Creatinine, Ser: 1.21 mg/dL — ABNORMAL HIGH (ref 0.44–1.00)
GFR, Estimated: 52 mL/min — ABNORMAL LOW
Glucose, Bld: 165 mg/dL — ABNORMAL HIGH (ref 70–99)
Potassium: 4.1 mmol/L (ref 3.5–5.1)
Sodium: 140 mmol/L (ref 135–145)

## 2021-02-09 LAB — SARS CORONAVIRUS 2 BY RT PCR (HOSPITAL ORDER, PERFORMED IN ~~LOC~~ HOSPITAL LAB): SARS Coronavirus 2: NEGATIVE

## 2021-02-09 LAB — CBC
HCT: 41.1 % (ref 36.0–46.0)
Hemoglobin: 13.1 g/dL (ref 12.0–15.0)
MCH: 27.6 pg (ref 26.0–34.0)
MCHC: 31.9 g/dL (ref 30.0–36.0)
MCV: 86.5 fL (ref 80.0–100.0)
Platelets: 373 10*3/uL (ref 150–400)
RBC: 4.75 MIL/uL (ref 3.87–5.11)
RDW: 15.9 % — ABNORMAL HIGH (ref 11.5–15.5)
WBC: 7.9 10*3/uL (ref 4.0–10.5)
nRBC: 0 % (ref 0.0–0.2)

## 2021-02-09 LAB — GLUCOSE, CAPILLARY
Glucose-Capillary: 185 mg/dL — ABNORMAL HIGH (ref 70–99)
Glucose-Capillary: 129 mg/dL — ABNORMAL HIGH (ref 70–99)
Glucose-Capillary: 122 mg/dL — ABNORMAL HIGH (ref 70–99)

## 2021-02-09 SURGERY — APPLICATION, GRAFT, SKIN, FULL-THICKNESS
Anesthesia: General | Site: Neck | Laterality: Right

## 2021-02-09 MED ORDER — MIDAZOLAM HCL 2 MG/2ML IJ SOLN
INTRAMUSCULAR | Status: DC | PRN
  Administered 2021-02-09: 2 mg via INTRAVENOUS

## 2021-02-09 MED ORDER — ONDANSETRON HCL 4 MG PO TABS: 4.0000 mg | ORAL_TABLET | Freq: Three times a day (TID) | ORAL | 0 refills | Status: DC | PRN

## 2021-02-09 MED ORDER — PROPOFOL 10 MG/ML IV BOLUS
INTRAVENOUS | Status: AC
  Filled 2021-02-09: qty 20

## 2021-02-09 MED ORDER — PHENYLEPHRINE HCL-NACL 10-0.9 MG/250ML-% IV SOLN
INTRAVENOUS | Status: DC | PRN
  Administered 2021-02-09: 50 ug/min via INTRAVENOUS

## 2021-02-09 MED ORDER — PROMETHAZINE HCL 25 MG/ML IJ SOLN
6.2500 mg | INTRAMUSCULAR | Status: AC | PRN
  Administered 2021-02-09 (×2): 6.25 mg via INTRAVENOUS

## 2021-02-09 MED ORDER — ALBUTEROL SULFATE HFA 108 (90 BASE) MCG/ACT IN AERS
INHALATION_SPRAY | RESPIRATORY_TRACT | Status: AC
  Filled 2021-02-09: qty 6.7

## 2021-02-09 MED ORDER — SUGAMMADEX SODIUM 500 MG/5ML IV SOLN
INTRAVENOUS | Status: DC | PRN
  Administered 2021-02-09: 400 mg via INTRAVENOUS

## 2021-02-09 MED ORDER — PROMETHAZINE HCL 25 MG/ML IJ SOLN
INTRAMUSCULAR | Status: AC
  Filled 2021-02-09: qty 1

## 2021-02-09 MED ORDER — LIDOCAINE HCL (CARDIAC) PF 100 MG/5ML IV SOSY
PREFILLED_SYRINGE | INTRAVENOUS | Status: DC | PRN
  Administered 2021-02-09: 80 mg via INTRAVENOUS

## 2021-02-09 MED ORDER — LIDOCAINE 2% (20 MG/ML) 5 ML SYRINGE
INTRAMUSCULAR | Status: AC
  Filled 2021-02-09: qty 5

## 2021-02-09 MED ORDER — HYDROCODONE-ACETAMINOPHEN 5-325 MG PO TABS: 1.0000 | ORAL_TABLET | ORAL | 0 refills | Status: DC | PRN

## 2021-02-09 MED ORDER — FENTANYL CITRATE (PF) 100 MCG/2ML IJ SOLN
INTRAMUSCULAR | Status: DC | PRN
  Administered 2021-02-09: 50 ug via INTRAVENOUS
  Administered 2021-02-09: 100 ug via INTRAVENOUS

## 2021-02-09 MED ORDER — CEFAZOLIN SODIUM-DEXTROSE 2-4 GM/100ML-% IV SOLN
2.0000 g | INTRAVENOUS | Status: DC
  Filled 2021-02-09: qty 100

## 2021-02-09 MED ORDER — 0.9 % SODIUM CHLORIDE (POUR BTL) OPTIME
TOPICAL | Status: DC | PRN
  Administered 2021-02-09: 1000 mL

## 2021-02-09 MED ORDER — FENTANYL CITRATE (PF) 100 MCG/2ML IJ SOLN
25.0000 ug | INTRAMUSCULAR | Status: DC | PRN
  Administered 2021-02-09 (×3): 50 ug via INTRAVENOUS

## 2021-02-09 MED ORDER — ORAL CARE MOUTH RINSE: 15.0000 mL | Freq: Once | OROMUCOSAL | Status: AC

## 2021-02-09 MED ORDER — FENTANYL CITRATE (PF) 100 MCG/2ML IJ SOLN
INTRAMUSCULAR | Status: AC
  Filled 2021-02-09: qty 2

## 2021-02-09 MED ORDER — LACTATED RINGERS IV SOLN: INTRAVENOUS | Status: DC

## 2021-02-09 MED ORDER — PROPOFOL 10 MG/ML IV BOLUS
INTRAVENOUS | Status: DC | PRN
  Administered 2021-02-09: 150 mg via INTRAVENOUS

## 2021-02-09 MED ORDER — ROCURONIUM BROMIDE 100 MG/10ML IV SOLN
INTRAVENOUS | Status: DC | PRN
  Administered 2021-02-09: 50 mg via INTRAVENOUS

## 2021-02-09 MED ORDER — CHLORHEXIDINE GLUCONATE 0.12 % MT SOLN
15.0000 mL | Freq: Once | OROMUCOSAL | Status: AC
  Administered 2021-02-09: 15 mL via OROMUCOSAL
  Filled 2021-02-09: qty 15

## 2021-02-09 MED ORDER — PHENYLEPHRINE 40 MCG/ML (10ML) SYRINGE FOR IV PUSH (FOR BLOOD PRESSURE SUPPORT)
PREFILLED_SYRINGE | INTRAVENOUS | Status: AC
  Filled 2021-02-09: qty 10

## 2021-02-09 MED ORDER — SUCCINYLCHOLINE CHLORIDE 200 MG/10ML IV SOSY
PREFILLED_SYRINGE | INTRAVENOUS | Status: DC | PRN
  Administered 2021-02-09: 180 mg via INTRAVENOUS

## 2021-02-09 MED ORDER — CEFAZOLIN IN SODIUM CHLORIDE 3-0.9 GM/100ML-% IV SOLN
3.0000 g | Freq: Once | INTRAVENOUS | Status: AC
  Administered 2021-02-09: 3 g via INTRAVENOUS

## 2021-02-09 MED ORDER — EPINEPHRINE PF 1 MG/ML IJ SOLN
INTRAMUSCULAR | Status: AC
  Filled 2021-02-09: qty 1

## 2021-02-09 MED ORDER — PHENYLEPHRINE HCL (PRESSORS) 10 MG/ML IV SOLN
INTRAVENOUS | Status: DC | PRN
  Administered 2021-02-09: 40 ug via INTRAVENOUS
  Administered 2021-02-09: 80 ug via INTRAVENOUS

## 2021-02-09 MED ORDER — CEFAZOLIN IN SODIUM CHLORIDE 3-0.9 GM/100ML-% IV SOLN
INTRAVENOUS | Status: AC
  Filled 2021-02-09: qty 100

## 2021-02-09 MED ORDER — SUCCINYLCHOLINE CHLORIDE 200 MG/10ML IV SOSY
PREFILLED_SYRINGE | INTRAVENOUS | Status: AC
  Filled 2021-02-09: qty 10

## 2021-02-09 MED ORDER — BUPIVACAINE-EPINEPHRINE (PF) 0.25% -1:200000 IJ SOLN
INTRAMUSCULAR | Status: AC
  Filled 2021-02-09: qty 30

## 2021-02-09 MED ORDER — ONDANSETRON HCL 4 MG/2ML IJ SOLN
INTRAMUSCULAR | Status: AC
  Filled 2021-02-09: qty 2

## 2021-02-09 MED ORDER — ONDANSETRON HCL 4 MG/2ML IJ SOLN
INTRAMUSCULAR | Status: DC | PRN
  Administered 2021-02-09: 4 mg via INTRAVENOUS

## 2021-02-09 MED ORDER — EPINEPHRINE PF 1 MG/ML IJ SOLN
INTRAMUSCULAR | Status: DC | PRN
  Administered 2021-02-09: 1 mg

## 2021-02-09 MED ORDER — BUPIVACAINE-EPINEPHRINE 0.25% -1:200000 IJ SOLN
INTRAMUSCULAR | Status: DC | PRN
  Administered 2021-02-09: 30 mL

## 2021-02-09 MED ORDER — SODIUM CHLORIDE (PF) 0.9 % IJ SOLN
INTRAMUSCULAR | Status: DC | PRN
  Administered 2021-02-09: 500 mL

## 2021-02-09 MED ORDER — FENTANYL CITRATE (PF) 250 MCG/5ML IJ SOLN
INTRAMUSCULAR | Status: AC
  Filled 2021-02-09: qty 5

## 2021-02-09 MED ORDER — MIDAZOLAM HCL 2 MG/2ML IJ SOLN
INTRAMUSCULAR | Status: AC
  Filled 2021-02-09: qty 2

## 2021-02-09 SURGICAL SUPPLY — 56 items
BAG DECANTER FOR FLEXI CONT (MISCELLANEOUS) ×4 IMPLANT
BENZOIN TINCTURE PRP APPL 2/3 (GAUZE/BANDAGES/DRESSINGS) ×2 IMPLANT
BLADE CLIPPER SURG (BLADE) IMPLANT
BLADE DERMATOME SS (BLADE) ×2 IMPLANT
BNDG ELASTIC 4X5.8 VLCR STR LF (GAUZE/BANDAGES/DRESSINGS) IMPLANT
BNDG ELASTIC 6X5.8 VLCR STR LF (GAUZE/BANDAGES/DRESSINGS) IMPLANT
BNDG GAUZE ELAST 4 BULKY (GAUZE/BANDAGES/DRESSINGS) IMPLANT
BRUSH SCRUB EZ PLAIN DRY (MISCELLANEOUS) ×2 IMPLANT
CANISTER SUCT 3000ML PPV (MISCELLANEOUS) IMPLANT
CANISTER WOUND CARE 500ML ATS (WOUND CARE) IMPLANT
COVER SURGICAL LIGHT HANDLE (MISCELLANEOUS) ×2 IMPLANT
DERMACARRIERS GRAFT 1 TO 1.5 (DISPOSABLE) ×2
DRAPE EXTREMITY T 121X128X90 (DISPOSABLE) IMPLANT
DRAPE HALF SHEET 40X57 (DRAPES) ×2 IMPLANT
DRAPE INCISE IOBAN 66X45 STRL (DRAPES) ×2 IMPLANT
DRAPE ORTHO SPLIT 77X108 STRL (DRAPES) ×4
DRAPE SURG ORHT 6 SPLT 77X108 (DRAPES) ×2 IMPLANT
DRSG CALCIUM ALGINATE 4X4 (GAUZE/BANDAGES/DRESSINGS) IMPLANT
DRSG MEPITEL 4X7.2 (GAUZE/BANDAGES/DRESSINGS) IMPLANT
DRSG OPSITE 6X11 MED (GAUZE/BANDAGES/DRESSINGS) IMPLANT
DRSG PAD ABDOMINAL 8X10 ST (GAUZE/BANDAGES/DRESSINGS) ×2 IMPLANT
DRSG VAC ATS LRG SENSATRAC (GAUZE/BANDAGES/DRESSINGS) IMPLANT
DRSG VAC ATS MED SENSATRAC (GAUZE/BANDAGES/DRESSINGS) IMPLANT
DRSG VAC ATS SM SENSATRAC (GAUZE/BANDAGES/DRESSINGS) IMPLANT
ELECT REM PT RETURN 9FT ADLT (ELECTROSURGICAL)
ELECTRODE REM PT RTRN 9FT ADLT (ELECTROSURGICAL) IMPLANT
FILTER STRAW FLUID ASPIR (MISCELLANEOUS) ×2 IMPLANT
GAUZE SPONGE 4X4 12PLY STRL (GAUZE/BANDAGES/DRESSINGS) IMPLANT
GAUZE XEROFORM 5X9 LF (GAUZE/BANDAGES/DRESSINGS) ×2 IMPLANT
GLOVE SURG ENC TEXT LTX SZ7.5 (GLOVE) ×4 IMPLANT
GLOVE SURG UNDER LTX SZ8 (GLOVE) ×4 IMPLANT
GOWN STRL REUS W/ TWL LRG LVL3 (GOWN DISPOSABLE) ×2 IMPLANT
GOWN STRL REUS W/TWL LRG LVL3 (GOWN DISPOSABLE) ×4
GRAFT DERMACARRIERS 1 TO 1.5 (DISPOSABLE) ×1 IMPLANT
HANDPIECE INTERPULSE COAX TIP (DISPOSABLE)
IV NS 1000ML (IV SOLUTION) ×2
IV NS 1000ML BAXH (IV SOLUTION) ×1 IMPLANT
KIT BASIN OR (CUSTOM PROCEDURE TRAY) ×2 IMPLANT
KIT TURNOVER KIT B (KITS) ×2 IMPLANT
MANIFOLD NEPTUNE II (INSTRUMENTS) ×2 IMPLANT
NEEDLE SPNL 18GX3.5 QUINCKE PK (NEEDLE) ×4 IMPLANT
NS IRRIG 1000ML POUR BTL (IV SOLUTION) ×2 IMPLANT
PACK GENERAL/GYN (CUSTOM PROCEDURE TRAY) ×2 IMPLANT
PAD ARMBOARD 7.5X6 YLW CONV (MISCELLANEOUS) ×4 IMPLANT
SET HNDPC FAN SPRY TIP SCT (DISPOSABLE) IMPLANT
STAPLER VISISTAT 35W (STAPLE) IMPLANT
STRIP CLOSURE SKIN 1/2X4 (GAUZE/BANDAGES/DRESSINGS) ×2 IMPLANT
SUT CHROMIC 4 0 PS 2 18 (SUTURE) ×4 IMPLANT
SUT ETHILON 3 0 PS 1 (SUTURE) ×4 IMPLANT
SUT MNCRL AB 3-0 PS2 18 (SUTURE) ×4 IMPLANT
SUT PDS AB 3-0 SH 27 (SUTURE) IMPLANT
SYR 50ML LL SCALE MARK (SYRINGE) ×2 IMPLANT
SYR CONTROL 10ML LL (SYRINGE) ×2 IMPLANT
TOWEL GREEN STERILE (TOWEL DISPOSABLE) ×2 IMPLANT
TOWEL GREEN STERILE FF (TOWEL DISPOSABLE) ×2 IMPLANT
UNDERPAD 30X36 HEAVY ABSORB (UNDERPADS AND DIAPERS) ×2 IMPLANT

## 2021-02-09 NOTE — Transfer of Care (Signed)
Immediate Anesthesia Transfer of Care Note  Patient: Yesenia [name]  Procedure(s) Performed: Release of right neck burn scar contracture and reconstruction with full-thickness skin graft from right lower abdomen. (Right: Neck)  Patient Location: PACU  Anesthesia Type:General  Level of Consciousness: awake, alert  and oriented  Airway & Oxygen Therapy: Patient Spontanous Breathing and Patient connected to face mask oxygen  Post-op Assessment: Report given to RN and Post -op Vital signs reviewed and stable  Post vital signs: Reviewed and stable BP 105/56  Last Vitals:  Vitals Value Taken Time  BP    Temp    Pulse 68 02/09/21 1110  Resp 20 02/09/21 1110  SpO2 95 % 02/09/21 1110  Vitals shown include unvalidated device data.  Last Pain:  Vitals:   02/09/21 0739  TempSrc:   PainSc: 0-No pain         Complications: No notable events documented.

## 2021-02-09 NOTE — Brief Op Note (Signed)
02/09/2021  10:56 AM  PATIENT:  Yesenia [name]  60 y.o. female  PRE-OPERATIVE DIAGNOSIS:  Burn scar contracture of multiple sites  POST-OPERATIVE DIAGNOSIS:  Burn scar contracture of multiple sites  PROCEDURE:  Procedure(s) with comments: Release of right neck burn scar contracture and reconstruction with full-thickness skin graft from right lower abdomen. (Right) - 60 min  SURGEON:  Surgeon(s) and Role:    * Darya Bigler, Wendy Poet, MD - Primary  PHYSICIAN ASSISTANT: Enedina Finner, RNFA  ASSISTANTS: none   ANESTHESIA:   none  EBL:  10   BLOOD ADMINISTERED:none  DRAINS: none   LOCAL MEDICATIONS USED:  MARCAINE     SPECIMEN:  No Specimen  DISPOSITION OF SPECIMEN:  N/A  COUNTS:  YES  TOURNIQUET:  * No tourniquets in log *  DICTATION: .Dragon Dictation  PLAN OF CARE: Discharge to home after PACU  PATIENT DISPOSITION:  PACU - hemodynamically stable.   Delay start of Pharmacological VTE agent (>24hrs) due to surgical blood loss or risk of bleeding: not applicable

## 2021-02-09 NOTE — Op Note (Signed)
Operative Note   DATE OF OPERATION: 02/09/2021  SURGICAL DEPARTMENT: Plastic Surgery  PREOPERATIVE DIAGNOSES: Right neck burn scar contracture  POSTOPERATIVE DIAGNOSES:  same  PROCEDURE: Release of right neck burn scar contracture and resurfacing with full-thickness skin graft totaling 6 x 4 cm  SURGEON: Ancil Linsey, MD  ASSISTANT: Enedina Finner, RNFA The advanced practice practitioner (APP) assisted throughout the case.  The APP was essential in retraction and counter traction when needed to make the case progress smoothly.  This retraction and assistance made it possible to see the tissue plans for the procedure.  The assistance was needed for blood control, tissue re-approximation and assisted with closure of the incision site.  ANESTHESIA:  General.   COMPLICATIONS: None.   INDICATIONS FOR PROCEDURE:  The patient, Yesenia [name] is a 60 y.o. 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 Risks, benefits and alternatives were fully discussed. Specific risks including but not limited to bleeding, infection, hematoma, seroma, scarring, pain, contracture, asymmetry, wound healing problems, and need for further surgery were all discussed. The patient did have an ample opportunity to have questions answered to satisfaction.   DESCRIPTION OF PROCEDURE:  The patient was taken to the operating room. SCDs were placed and antibiotics were given.  General anesthesia was administered.  The patient's operative site was prepped and draped in a sterile fashion. A time out was performed and all information was confirmed to be correct.  I had marked the patient preoperatively demonstrating where her contracture occurred.  I then infiltrated that area with tumescent solution in addition to solution in the lower right abdomen.  I then incised along the burn contracture  transversely with a 15 blade and dissected down through all scar tissue with cautery.  This was down to but not through the platysma.  Hemostasis was obtained.  This allowed the skin to separate 4 cm on stretch and should correct her issue.  I then harvested a full-thickness skin graft from the right abdomen and closed it with interrupted buried 3-0 Monocryl sutures and a running 3-0 Monocryl subcuticular.  The graft was then defatted and inset with 4-0 chromic sutures and a bolster was fashioned with Xeroform and a scrub brush sponge.  This was secured with 3-0 nylon's.  Patient tolerated the procedure well had a good on table result.  The patient tolerated the procedure well.  There were no complications. The patient was allowed to wake from anesthesia, extubated and taken to the recovery room in satisfactory condition.

## 2021-02-09 NOTE — Anesthesia Postprocedure Evaluation (Signed)
Anesthesia Post Note  Patient: Yesenia [name]  Procedure(s) Performed: Release of right neck burn scar contracture and reconstruction with full-thickness skin graft from right lower abdomen. (Right: Neck)     Patient location during evaluation: PACU Anesthesia Type: General Level of consciousness: awake Pain management: pain level controlled Vital Signs Assessment: post-procedure vital signs reviewed and stable Respiratory status: spontaneous breathing and respiratory function stable Cardiovascular status: stable Postop Assessment: no apparent nausea or vomiting Anesthetic complications: no   No notable events documented.  Last Vitals:  Vitals:   02/09/21 1311 02/09/21 1326  BP: 136/84 119/85  Pulse: (!) 56 (!) 58  Resp: 13 18  Temp:    SpO2: 98% 97%    Last Pain:  Vitals:   02/09/21 1311  TempSrc:   PainSc: 0-No pain                 Mellody Dance

## 2021-02-09 NOTE — Discharge Instructions (Signed)
Activity: As tolerated, but avoid strenuous activity until follow up visit.  Diet: Regular  Wound Care: Keep dressing clean & dry until follow-up.  Would avoid getting the neck dressing wet.  Abdomen can get wet.Earnest Conroy the wound as needed for comfort.  Encourage neck range of motion as tolerated.  Special Instructions:  Call our office if any unusual problems occur such as pain, excessive bleeding, unrelieved nausea/vomiting, fever &/or chills.  Follow-up appointment: Scheduled for next week.

## 2021-02-09 NOTE — Anesthesia Procedure Notes (Signed)
Procedure Name: Intubation Date/Time: 02/09/2021 9:58 AM Performed by: Elbert Ewings, CRNA Pre-anesthesia Checklist: Patient identified, Emergency Drugs available, Suction available and Patient being monitored Patient Re-evaluated:Patient Re-evaluated prior to induction Oxygen Delivery Method: Circle system utilized Preoxygenation: Pre-oxygenation with 100% oxygen Induction Type: IV induction Ventilation: Mask ventilation with difficulty Laryngoscope Size: Glidescope and 3 Grade View: Grade I Tube type: Oral Tube size: 7.0 mm Number of attempts: 1 Airway Equipment and Method: Stylet Placement Confirmation: ETT inserted through vocal cords under direct vision, positive ETCO2 and breath sounds checked- equal and bilateral Secured at: 21 cm Tube secured with: Tape Dental Injury: Teeth and Oropharynx as per pre-operative assessment

## 2021-02-09 NOTE — Anesthesia Preprocedure Evaluation (Addendum)
Anesthesia Evaluation  Patient identified by MRN, date of birth, ID band Patient awake    Reviewed: Allergy & Precautions, NPO status , Patient's Chart, lab work & pertinent test results  Airway Mallampati: II  TM Distance: >3 FB Neck ROM: Full    Dental  (+) Poor Dentition   Pulmonary sleep apnea , COPD,  H/o tracheostomy in 2017   Pulmonary exam normal breath sounds clear to auscultation       Cardiovascular hypertension, Pt. on home beta blockers and Pt. on medications Normal cardiovascular exam Rhythm:Regular Rate:Normal     Neuro/Psych PSYCHIATRIC DISORDERS Anxiety Depression  Neuromuscular disease (polyneuropathy) CVA (right sided weakness), Residual Symptoms    GI/Hepatic PUD, GERD  ,  Endo/Other  diabetesHyperthyroidism Morbid obesity  Renal/GU      Musculoskeletal  (+) Arthritis , Fibromyalgia -  Abdominal   Peds  Hematology   Anesthesia Other Findings   Reproductive/Obstetrics                            Anesthesia Physical Anesthesia Plan  ASA: 4  Anesthesia Plan: General   Post-op Pain Management:    Induction: Intravenous  PONV Risk Score and Plan: 3 and Treatment may vary due to age or medical condition, Ondansetron and Dexamethasone  Airway Management Planned: Oral ETT and Video Laryngoscope Planned  Additional Equipment: None  Intra-op Plan:   Post-operative Plan: Extubation in OR  Informed Consent: I have reviewed the patients History and Physical, chart, labs and discussed the procedure including the risks, benefits and alternatives for the proposed anesthesia with the patient or authorized representative who has indicated his/her understanding and acceptance.     Dental advisory given  Plan Discussed with: CRNA, Anesthesiologist and Surgeon  Anesthesia Plan Comments: (GETA. Glidescope. 7.0 ETT due to presence of trach scar. Tanna Furry, MD  )        Anesthesia Quick Evaluation

## 2021-02-09 NOTE — Interval H&P Note (Signed)
History and Physical Interval Note:  02/09/2021 7:43 AM  Yesenia [name]  has presented today for surgery, with the diagnosis of Burn scar contracture of multiple sites.  The various methods of treatment have been discussed with the patient and family. After consideration of risks, benefits and other options for treatment, the patient has consented to  Procedure(s) with comments: Release of right neck burn scar contracture and reconstruction with full-thickness skin graft. (Right) - 60 min as a surgical intervention.  The patient's history has been reviewed, patient examined, no change in status, stable for surgery.  I have reviewed the patient's chart and labs.  Questions were answered to the patient's satisfaction.     Yesenia [name]

## 2021-02-09 NOTE — H&P (Signed)
Reason for Consult/CC: Neck scar contracture  Yesenia [name] is an 60 y.o. female.  HPI: Patient presents for release of a neck scar contracture.  Allergies:  Allergies  Allergen Reactions   Buprenorphine Hcl Hives   Ibuprofen Hives   Morphine And Related Hives and Dermatitis    Medications:  Current Facility-Administered Medications:    ceFAZolin (ANCEF) IVPB 2g/100 mL premix, 2 g, Intravenous, On Call to OR, Annita Ratliff, Wendy Poet, MD   chlorhexidine (PERIDEX) 0.12 % solution 15 mL, 15 mL, Mouth/Throat, Once **OR** MEDLINE mouth rinse, 15 mL, Mouth Rinse, Once, Mellody Dance, MD   lactated ringers infusion, , Intravenous, Continuous, Mellody Dance, MD  Past Medical History:  Diagnosis Date   Abdominal pain, epigastric    Abscess    Acute constipation 02/16/2016   Acute stomach ulcer 08/15/2017   AKI (acute kidney injury) (HCC)    Acute kidney injury on CKD stage II-III/notes 08/17/2017   Anemia    Anemia    Arthritis    "knees" (08/17/2017)   Bleeding 06/23/2016   Blood transfusion without reported diagnosis 2012   anemia   Cerebral infarction (HCC) 01/14/2013   Chest pain 02/15/2013   Chronic pain    CKD (chronic kidney disease)    Stage 3   Depression    Dysuria 12/06/2018   Fibromyalgia    hospitilized 12/16 due to inability to walk   Gastric ulcer    GERD (gastroesophageal reflux disease)    Graves disease    History of gastritis 2019   History of kidney stones    History of tracheostomy 05/20/2016   Hyperlipemia    Hypertension    Hyperthyroidism    Insomnia    Iron deficiency anemia 05/26/2016   Irritable bowel syndrome 05/21/2016   Neuropathy    Obstructive sleep apnea of adult 10/03/2015   Pneumonia    Right sided weakness    Sleep apnea    "CPAPs didn't work for me; that's why they did OR" (08/17/2017)   Stroke [facility]) 2014?   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   Tremor 10/25/2017   Type II diabetes mellitus (HCC)     does  not check CBG's (08/17/2017), no meds   Vitamin deficiency    Vit D    Past Surgical History:  Procedure Laterality Date   ANTERIOR INTEROSSEOUS NERVE DECOMPRESSION Right 06/16/2018   Procedure: RIGHT ULNAR NEUROPLATY AT THE ELBOW AND WRIST;  Surgeon: Mack Hook, MD;  Location: [facility] OR;  Service: Orthopedics;  Laterality: Right;   ESOPHAGOGASTRODUODENOSCOPY (EGD) WITH PROPOFOL N/A 08/16/2017   Procedure: ESOPHAGOGASTRODUODENOSCOPY (EGD) WITH PROPOFOL;  Surgeon: Benancio Deeds, MD;  Location: [facility] ENDOSCOPY;  Service: Gastroenterology;  Laterality: N/A;   KNEE ARTHROPLASTY  1997   "stuck rods in it"   KNEE ARTHROSCOPY Right 1992, 1995,   OOPHORECTOMY Right 1993?   TRACHEOSTOMY TUBE PLACEMENT N/A 02/13/2016   Procedure: TRACHEOSTOMY;  Surgeon: Christia Reading, MD;  Location: [facility] OR;  Service: ENT;  Laterality: N/A;   TUBAL LIGATION      Family History  Problem Relation Age of Onset   Breast cancer Mother    Leukemia Brother    Breast cancer Maternal Grandmother    Colon cancer Neg Hx    Esophageal cancer Neg Hx    Rectal cancer Neg Hx    Stomach cancer Neg Hx     Social History:  reports that she has never smoked. She has never used smokeless tobacco.  She reports that she does not drink alcohol and does not use drugs.  Physical Exam Blood pressure (!) 144/83, pulse 69, temperature 98 F (36.7 C), temperature source Oral, resp. rate 17, height 5\' 7"  (1.702 m), weight (!) 147.4 kg, SpO2 96 %. General: Alert and oriented Cardiovascular: Regular rhythm Pulmonary: Unlabored Neck scar contracture of the right neck.  Results for orders placed or performed during the hospital encounter of 02/09/21 (from the past 48 hour(s))  Glucose, capillary     Status: Abnormal   Collection Time: 02/09/21  7:21 AM  Result Value Ref Range   Glucose-Capillary 185 (H) 70 - 99 mg/dL    Comment: Glucose reference range applies only to samples taken after fasting for at least 8 hours.    No results  found.  Assessment/Plan: Patient presents for release of scar contracture of the right neck.  We discussed releasing full-thickness skin graft from the abdomen.  We discussed the risks include bleeding, infection, damage to surrounding structures partial and total graft loss.  All of her questions were answered and she wants to move forward.  04/11/21 02/09/2021, 7:42 AM

## 2021-02-10 ENCOUNTER — Telehealth: Payer: Self-pay

## 2021-02-10 ENCOUNTER — Encounter (HOSPITAL_COMMUNITY): Payer: Self-pay | Admitting: Plastic Surgery

## 2021-02-10 NOTE — Telephone Encounter (Signed)
Patient called to say that she had a skin graft yesterday.  She said that the top of her dressing is hanging down and she can see the stitches and everything.  Patient would like to know what she should do.  Please call.

## 2021-02-10 NOTE — Telephone Encounter (Signed)
Returned patients call. The Kerlix from the surgery has become loose and can see the sutures. No other dressings were under the Kerlix. Spoke with Winner and advised patient can rewrapped the dressing and tape for security. Patient understood and agreed with plan. Next follow up appointment is 02/17/2021 at 2:00pm with [facility].

## 2021-02-11 ENCOUNTER — Other Ambulatory Visit: Payer: Self-pay | Admitting: Family Medicine

## 2021-02-13 ENCOUNTER — Other Ambulatory Visit: Payer: Self-pay | Admitting: Family Medicine

## 2021-02-15 NOTE — Progress Notes (Signed)
01/22/21     Patient ID: Yesenia FrederickSonja R [name], female    DOB: 05-30-1961, 60 y.o.   MRN: [id number]  Chief Complaint  Patient presents with   Pre-op Exam    No diagnosis found. Right neck burn scar contracture   History of Present Illness: Yesenia [name] is a 60 y.o.  female  with a history of right neck burn-scar contracture.  She presents for preoperative evaluation for upcoming procedure, right neck burn scar release of contracture & reconstruction with full thickness skin graft, scheduled for 02/09/21 with Dr. Arita MissPace.  The patient has not had problems with anesthesia.   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 (Patient not taking: Reported on 02/02/2021), Disp: 30 tablet, Rfl: 0   Blood Pressure Monitoring (BLOOD PRESSURE MON/AUTO/WRIST) DEVI, 1 Units by Does not apply route daily., Disp: 1 Device, Rfl: 0   cholestyramine (QUESTRAN) 4 g packet, Use one 4 gm packed mixed into 4-6 oz of clear liquid daily. Do not take within 4 hours of any other medication (Patient taking differently: Take 4 g by mouth daily. Use one 4 gm packed mixed into 4-6 oz of clear liquid daily. Do not take within 4 hours of any other  medication), Disp: 60 each, Rfl: 2   Diaper Rash Products (DESITIN MULTI-PURPOSE HEALING) OINT, Apply 1 application topically 2 (two) times daily. (Patient taking differently: Apply 1 application topically daily as needed (Rash).), Disp: 397 g, Rfl: 0   diclofenac Sodium (VOLTAREN) 1 % GEL, Apply 2 g topically 4 (four) times daily. (Patient not taking: Reported on 02/02/2021), Disp: 100 g, Rfl: 0   fluticasone furoate-vilanterol (BREO ELLIPTA) 100-25 MCG/INH AEPB, Inhale 1 puff into the lungs daily. (Patient not taking: Reported on 02/02/2021), Disp: 60 each, Rfl: 5   gabapentin (NEURONTIN) 300 MG capsule, Take 3 capsules (900 mg total) by mouth at bedtime., Disp: 90 capsule, Rfl: 2   hydrOXYzine (ATARAX/VISTARIL) 25 MG tablet, Take 1 tablet (25 mg total) by mouth every 6 (six) hours as needed for anxiety., Disp: 60 tablet, Rfl: 0   methimazole (TAPAZOLE) 10 MG tablet, Take 1.5 tablets (15 mg total) by mouth daily. (Patient taking differently: Take 10 mg by mouth daily.), Disp: 45 tablet, Rfl: 0   atenolol (TENORMIN) 100 MG tablet, Take 1 tablet (100 mg total) by mouth daily., Disp: 90 tablet, Rfl: 5   HYDROcodone-acetaminophen (NORCO) 5-325 MG tablet, Take 1 tablet by mouth every 4 (four) hours as needed for moderate pain., Disp: 25 tablet, Rfl: 0   hydrOXYzine (ATARAX/VISTARIL) 50 MG tablet, Take 1.5 tablets (75 mg total) by mouth at bedtime., Disp: 60 tablet, Rfl: 5   ondansetron (ZOFRAN) 4 MG tablet, Take 1 tablet (4 mg total) by mouth every 8 (  eight) hours as needed for nausea or vomiting., Disp: 5 tablet, Rfl: 0   ondansetron (ZOFRAN-ODT) 8 MG disintegrating tablet, Take 1 tablet (8 mg total) by mouth every 8 (eight) hours as needed for nausea or vomiting. No additional refill until EKG at clinic., Disp: 20 tablet, Rfl: 0   PROAIR HFA 108 (90 Base) MCG/ACT inhaler, Inhale 2 puffs into the lungs every 6 (six) hours as needed for wheezing or shortness of breath., Disp: 18 g, Rfl: 5   ramelteon  (ROZEREM) 8 MG tablet, Take 1 tablet (8 mg total) by mouth at bedtime as needed for sleep., Disp: 90 tablet, Rfl: 1   traZODone (DESYREL) 50 MG tablet, Take 1 tablet (50 mg total) by mouth at bedtime as needed for sleep., Disp: 60 tablet, Rfl: 0  Past Medical Problems: Past Medical History:  Diagnosis Date   Abdominal pain, epigastric    Abscess    Acute constipation 02/16/2016   Acute stomach ulcer 08/15/2017   AKI (acute kidney injury) (HCC)    Acute kidney injury on CKD stage II-III/notes 08/17/2017   Anemia    Anemia    Arthritis    "knees" (08/17/2017)   Bleeding 06/23/2016   Blood transfusion without reported diagnosis 2012   anemia   Cerebral infarction (HCC) 01/14/2013   Chest pain 02/15/2013   Chronic pain    CKD (chronic kidney disease)    Stage 3   Depression    Dysuria 12/06/2018   Fibromyalgia    hospitilized 12/16 due to inability to walk   Gastric ulcer    GERD (gastroesophageal reflux disease)    Graves disease    History of gastritis 2019   History of kidney stones    History of tracheostomy 05/20/2016   Hyperlipemia    Hypertension    Hyperthyroidism    Insomnia    Iron deficiency anemia 05/26/2016   Irritable bowel syndrome 05/21/2016   Neuropathy    Obstructive sleep apnea of adult 10/03/2015   Pneumonia    Right sided weakness    Sleep apnea    "CPAPs didn't work for me; that's why they did OR" (08/17/2017)   Stroke (HCC) 2014?   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   Tremor 10/25/2017   Type II diabetes mellitus (HCC)     does not check CBG's (08/17/2017), no meds   Vitamin deficiency    Vit D    Past Surgical History: Past Surgical History:  Procedure Laterality Date   ANTERIOR INTEROSSEOUS NERVE DECOMPRESSION Right 06/16/2018   Procedure: RIGHT ULNAR NEUROPLATY AT THE ELBOW AND WRIST;  Surgeon: Mack Hook, MD;  Location: [facility] OR;  Service: Orthopedics;  Laterality: Right;   ESOPHAGOGASTRODUODENOSCOPY (EGD) WITH  PROPOFOL N/A 08/16/2017   Procedure: ESOPHAGOGASTRODUODENOSCOPY (EGD) WITH PROPOFOL;  Surgeon: Benancio Deeds, MD;  Location: [facility] ENDOSCOPY;  Service: Gastroenterology;  Laterality: N/A;   KNEE ARTHROPLASTY  1997   "stuck rods in it"   KNEE ARTHROSCOPY Right 1992, 1995,   OOPHORECTOMY Right 1993?   SKIN FULL THICKNESS GRAFT Right 02/09/2021   Procedure: Release of right neck burn scar contracture and reconstruction with full-thickness skin graft from right lower abdomen.;  Surgeon: Allena Napoleon, MD;  Location: [facility] OR;  Service: Plastics;  Laterality: Right;  60 min   TRACHEOSTOMY TUBE PLACEMENT N/A 02/13/2016   Procedure: TRACHEOSTOMY;  Surgeon: Christia Reading, MD;  Location: [facility] OR;  Service: ENT;  Laterality: N/A;   TUBAL LIGATION  Social History: Social History   Socioeconomic History   Marital status: Divorced    Spouse name: Not on file   Number of children: 2   Years of education: 12th    Highest education level: Not on file  Occupational History   Occupation: SSI  Tobacco Use   Smoking status: Never   Smokeless tobacco: Never  Vaping Use   Vaping Use: Never used  Substance and Sexual Activity   Alcohol use: No    Alcohol/week: 0.0 standard drinks   Drug use: No   Sexual activity: Not Currently  Other Topics Concern   Not on file  Social History Narrative   Reports no caffeine use    Social Determinants of Corporate investment banker Strain: Not on file  Food Insecurity: Not on file  Transportation Needs: Not on file  Physical Activity: Not on file  Stress: Not on file  Social Connections: Not on file  Intimate Partner Violence: Not on file    Family History: Family History  Problem Relation Age of Onset   Breast cancer Mother    Leukemia Brother    Breast cancer Maternal Grandmother    Colon cancer Neg Hx    Esophageal cancer Neg Hx    Rectal cancer Neg Hx    Stomach cancer Neg Hx     Review of Systems: ROS   Physical Exam: Vital Signs BP  (!) 145/86 (BP Location: Right Wrist, Patient Position: Sitting, Cuff Size: Normal)   Pulse 99   Ht 5\' 7"  (1.702 m)   Wt (!) 334 lb (151.5 kg)   SpO2 95%   BMI 52.31 kg/m   Physical Exam  Constitutional:      General: Not in acute distress.    Appearance: Normal appearance. Not ill-appearing.  HENT:     Head: Normocephalic and atraumatic.  Eyes:     Pupils: Pupils are equal, round Neck:     Musculoskeletal: Normal range of motion.  Cardiovascular:     Rate and Rhythm: Normal rate    Pulses: Normal pulses.  Pulmonary:     Effort: Pulmonary effort is normal. No respiratory distress.  Abdominal:     General: Abdomen is flat. There is no distension.  Musculoskeletal: Normal range of motion.  Skin:    General: Skin is warm and dry.     Findings: No erythema or rash.  Neurological:     General: No focal deficit present.     Mental Status: Alert and oriented to person, place, and time. Mental status is at baseline.     Motor: No weakness.  Psychiatric:        Mood and Affect: Mood normal.        Behavior: Behavior normal.    Assessment/Plan: The patient is scheduled for release of right neck burn scar contracture & reconstruction with full thickness skin graft with Dr. .  Risks, benefits, and alternatives of procedure discussed, questions answered and consent obtained.    Smoking Status: no; Counseling Given? N/a Last Mammogram: 2022; Results: neg  Caprini Score: 5; Risk Factors include: , BMI = 52.36, and length of planned surgery. Recommendation for mechanical SCD prophylaxis. Encourage early ambulation.   Pictures obtained: yes Post-op Rx sent to pharmacy: yes- Dr. 2023  Wound care supplies ordered No  Patient was provided with the  General Surgical Risk consent document and Pain Medication Agreement prior to their appointment.  They had adequate time to read through the risk consent documents and Pain  Medication Agreement. We also discussed them in person together  during this preop appointment. All of their questions were answered to their satisfaction.  Recommended calling if they have any further questions.  Risk consent form and Pain Medication Agreement to be scanned into patient's chart.     Electronically signed by: Enedina Finner, RN 02/15/2021 6:51 PM

## 2021-02-15 NOTE — Patient Instructions (Signed)
Patient will call for any changes 

## 2021-02-17 ENCOUNTER — Other Ambulatory Visit: Payer: Self-pay

## 2021-02-17 ENCOUNTER — Ambulatory Visit (INDEPENDENT_AMBULATORY_CARE_PROVIDER_SITE_OTHER): Payer: Medicaid Other | Admitting: Surgical

## 2021-02-17 DIAGNOSIS — L905 Scar conditions and fibrosis of skin: Secondary | ICD-10-CM

## 2021-02-17 NOTE — Progress Notes (Signed)
Patient is a 60 year old female here for follow-up after release of right neck burn scar contracture and resurfacing with full-thickness skin graft with Dr. Arita Miss on 02/09/2021.  Patient reports overall she is doing really well.  She is nervous about removing the bolster today.  She is not having any infectious symptoms.  On exam bolster is in place, this was removed to reveal an intact full-thickness skin graft with some superficial epithelium that was sloughing off.  There is no surrounding cellulitic changes.  No foul odors.  No purulent drainage.    Recommend Xeroform dressing changes to right neck full-thickness skin graft every other day.  Recommend avoiding getting this area wet at this time.  Recommend following up in 1 week for reevaluation.  Recommend calling with questions or concerns.  Picture was taken and placed in the patient's chart with patient's permission.

## 2021-02-20 ENCOUNTER — Other Ambulatory Visit: Payer: Self-pay

## 2021-02-20 MED ORDER — HYDROXYZINE HCL 25 MG PO TABS: 25.0000 mg | ORAL_TABLET | Freq: Four times a day (QID) | ORAL | 1 refills | Status: DC | PRN

## 2021-02-24 ENCOUNTER — Ambulatory Visit: Payer: Medicaid Other | Admitting: Family Medicine

## 2021-02-26 ENCOUNTER — Encounter: Payer: Medicaid Other | Admitting: Plastic Surgery

## 2021-03-04 ENCOUNTER — Telehealth: Payer: Self-pay

## 2021-03-04 NOTE — Telephone Encounter (Signed)
Patient called to say that she had surgery on 02/09/2021 and she is having trouble getting the tape to stick to her neck.  Patient said that she has tried the clear tape and the white tape.  Please call.

## 2021-03-04 NOTE — Telephone Encounter (Signed)
Left message x pt to make sure area where tape will be is dry. The tape is really the only thing that can be used to keep the guaze in place over the wound. Adv her to not apply lotion or oil to where the tape will be. If she is sweating, try to keep the area dry and tape on as best as she can.   Pt has f/u appt on 8/31.

## 2021-03-11 ENCOUNTER — Other Ambulatory Visit: Payer: Self-pay

## 2021-03-11 ENCOUNTER — Ambulatory Visit (INDEPENDENT_AMBULATORY_CARE_PROVIDER_SITE_OTHER): Payer: Medicaid Other | Admitting: Plastic Surgery

## 2021-03-11 DIAGNOSIS — L905 Scar conditions and fibrosis of skin: Secondary | ICD-10-CM

## 2021-03-11 NOTE — Progress Notes (Signed)
Patient presents postop from release of burn contracture of the right neck and full-thickness skin graft.  She feels like she is doing well and feels like she does have improved range of motion in her neck.  On exam it looks like the majority the skin graft is taken with the small area centrally that looks to be open and yet to epithelialize.  We will plan to treat this with Vaseline and dressings and see her again in 3 weeks.  Her donor site looks to be healing fine.  I encouraged her to move her neck throughout this time to prevent any recurrence of the contracture.  She is understanding and we will see her at her next visit.

## 2021-03-12 ENCOUNTER — Encounter: Payer: Self-pay | Admitting: Family Medicine

## 2021-03-12 NOTE — Patient Instructions (Addendum)
It was wonderful to meet you today. Thank you for allowing me to be a part of your care. Below is a short summary of what we discussed at your visit today:  Cholesterol - increase your atorvastatin from 40 mg to 80 mg (you may continue to take your old 40 mg tablets until they are finished - simply take two (80 mg) every day) - we will recheck your lipid panel in October to make sure you are responding well  Diabetes type 2 - start Jardiance 10 mg tablet daily - we will follow up with you in one month to make sure you are tolerating the medicines - next A1c in 2-3 months  Finger numbness, weakness - labs today to check blood sugar, vitamin B12 and folate, and your thyroid - I will send you for a nerve conduction study with neurology to make sure your median nerve is working correctly  Chronic Kidney Disease - today we obtained a blood sample to check your creatinine  Health Maintenance We like to think about ways to keep you healthy for years to come. Below are some interventions and screenings we can offer to keep you healthy: - PAP smear (you can schedule with the front desk at your convenience) - COVID vaccine (available any time in clinic) - Flu vaccine (available in clinic after Labor Day) - Shingles vaccine (would be sent to your pharmacy for administration, insurance requirement) - pneumococcal pneumonia vaccine (available any time in clinic)   Please bring all of your medications to every appointment!  If you have any questions or concerns, please do not hesitate to contact us via phone or MyChart message.   Fayette Pho, MD

## 2021-03-12 NOTE — Progress Notes (Signed)
    SUBJECTIVE:   CHIEF COMPLAINT / HPI:   Right arm knot, pain - Right forearm knot near bend of elbow - Right arm more swollen than the left - Duration for couple weeks to months of right arm swelling - Patient unsure of how long the forearm knot has been there - Right bicep painful to squeeze  Left hand numbness, tingling - loss of sensaton intermittently - Left index and second finger weak -Been worsening over the last year, intermittent  PERTINENT  PMH / PSH: CVA, HTN, T2DM, hypothyroidism, fibromyalgia, CKD stage III, MDD, obesity, disordered sleep and circadian dysregulation  OBJECTIVE:   BP 128/74   Ht 5\' 7"  (1.702 m)   Wt (!) 335 lb (152 kg)   BMI 52.47 kg/m   PHQ-9:  Depression screen [facility] 2/9 03/13/2021 01/19/2021 10/10/2020  Decreased Interest 2 1 0  Down, Depressed, Hopeless 0 0 0  PHQ - 2 Score 2 1 0  Altered sleeping 2 1 -  Tired, decreased energy 2 1 2   Change in appetite 2 1 2   Feeling bad or failure about yourself  0 0 0  Trouble concentrating 0 0 0  Moving slowly or fidgety/restless 3 0 2  Suicidal thoughts 0 0 0  PHQ-9 Score 11 4 -  Difficult doing work/chores - - -  Some recent data might be hidden     GAD-7: No flowsheet data found.   Physical Exam General: Awake, alert, oriented, no acute distress Respiratory: Unlabored respirations, speaking in full sentences, no respiratory distress MSK: Negative Hawkins, bilateral elbow flexion decreased equally, left grip strength 4/5, left wrist extension and flexion 4/5 (and decreased compared to right) Extremities: Firm palpable nodule of inner right elbow over biceps insertion, right bicep tender to palpation, right upper arm larger in diameter than left upper arm, right arm without overt redness or heat  ASSESSMENT/PLAN:   Right arm pain Right bicep tenderness and firm palpable nodule overlying right bicep insertion site.  No known trauma or injury.  Concern for partial right bicep tendon tear.  Will  refer to sports medicine for ultrasound evaluation.  Left hand weakness Progressively worsening left hand weakness with numbness and tingling.  Differential diagnosis includes B12 or folate deficiency, hyperthyroidism, although more likely median nerve impingement. - B12, folate, TSH -Nerve conduction study with neuro     12/10/2020, MD [facility]

## 2021-03-13 ENCOUNTER — Encounter: Payer: Self-pay | Admitting: Family Medicine

## 2021-03-13 ENCOUNTER — Other Ambulatory Visit: Payer: Self-pay

## 2021-03-13 ENCOUNTER — Ambulatory Visit (INDEPENDENT_AMBULATORY_CARE_PROVIDER_SITE_OTHER): Payer: Medicaid Other | Admitting: Family Medicine

## 2021-03-13 VITALS — BP 128/74 | Ht 67.0 in | Wt 335.0 lb

## 2021-03-13 DIAGNOSIS — E785 Hyperlipidemia, unspecified: Secondary | ICD-10-CM | POA: Diagnosis not present

## 2021-03-13 DIAGNOSIS — G629 Polyneuropathy, unspecified: Secondary | ICD-10-CM | POA: Diagnosis not present

## 2021-03-13 DIAGNOSIS — R200 Anesthesia of skin: Secondary | ICD-10-CM | POA: Diagnosis not present

## 2021-03-13 DIAGNOSIS — I639 Cerebral infarction, unspecified: Secondary | ICD-10-CM

## 2021-03-13 DIAGNOSIS — N183 Chronic kidney disease, stage 3 unspecified: Secondary | ICD-10-CM

## 2021-03-13 DIAGNOSIS — M79601 Pain in right arm: Secondary | ICD-10-CM | POA: Diagnosis not present

## 2021-03-13 DIAGNOSIS — E1169 Type 2 diabetes mellitus with other specified complication: Secondary | ICD-10-CM | POA: Diagnosis not present

## 2021-03-13 DIAGNOSIS — S46211A Strain of muscle, fascia and tendon of other parts of biceps, right arm, initial encounter: Secondary | ICD-10-CM | POA: Diagnosis not present

## 2021-03-13 DIAGNOSIS — E1142 Type 2 diabetes mellitus with diabetic polyneuropathy: Secondary | ICD-10-CM

## 2021-03-13 DIAGNOSIS — R29898 Other symptoms and signs involving the musculoskeletal system: Secondary | ICD-10-CM

## 2021-03-13 MED ORDER — ATORVASTATIN CALCIUM 80 MG PO TABS: 80.0000 mg | ORAL_TABLET | Freq: Every day | ORAL | 3 refills | Status: DC

## 2021-03-13 MED ORDER — EMPAGLIFLOZIN 10 MG PO TABS
10.0000 mg | ORAL_TABLET | Freq: Every day | ORAL | 3 refills | Status: DC
Start: 2021-03-13 — End: 2022-02-25

## 2021-03-14 ENCOUNTER — Other Ambulatory Visit: Payer: Self-pay | Admitting: Family Medicine

## 2021-03-14 ENCOUNTER — Encounter: Payer: Self-pay | Admitting: Family Medicine

## 2021-03-14 DIAGNOSIS — R200 Anesthesia of skin: Secondary | ICD-10-CM

## 2021-03-14 LAB — BASIC METABOLIC PANEL
BUN/Creatinine Ratio: 12 (ref 9–23)
BUN: 14 mg/dL (ref 6–24)
CO2: 21 mmol/L (ref 20–29)
Calcium: 9.1 mg/dL (ref 8.7–10.2)
Chloride: 108 mmol/L — ABNORMAL HIGH (ref 96–106)
Creatinine, Ser: 1.19 mg/dL — ABNORMAL HIGH (ref 0.57–1.00)
Glucose: 109 mg/dL — ABNORMAL HIGH (ref 65–99)
Potassium: 4.8 mmol/L (ref 3.5–5.2)
Sodium: 144 mmol/L (ref 134–144)
eGFR: 53 mL/min/{1.73_m2} — ABNORMAL LOW (ref >59)

## 2021-03-14 LAB — TSH: TSH: 1.2 u[IU]/mL (ref 0.450–4.500)

## 2021-03-14 LAB — VITAMIN B12: Vitamin B-12: 436 pg/mL (ref 232–1245)

## 2021-03-14 LAB — LDL CHOLESTEROL, DIRECT: LDL Direct: 167 mg/dL — ABNORMAL HIGH (ref 0–99)

## 2021-03-14 LAB — FOLATE: Folate: 9.7 ng/mL (ref >3.0)

## 2021-03-15 DIAGNOSIS — M79601 Pain in right arm: Secondary | ICD-10-CM | POA: Insufficient documentation

## 2021-03-15 DIAGNOSIS — R29898 Other symptoms and signs involving the musculoskeletal system: Secondary | ICD-10-CM | POA: Insufficient documentation

## 2021-03-15 NOTE — Assessment & Plan Note (Signed)
Right bicep tenderness and firm palpable nodule overlying right bicep insertion site.  No known trauma or injury.  Concern for partial right bicep tendon tear.  Will refer to sports medicine for ultrasound evaluation.

## 2021-03-15 NOTE — Assessment & Plan Note (Signed)
Progressively worsening left hand weakness with numbness and tingling.  Differential diagnosis includes B12 or folate deficiency, hyperthyroidism, although more likely median nerve impingement. - B12, folate, TSH -Nerve conduction study with neuro

## 2021-03-17 ENCOUNTER — Encounter: Payer: Self-pay | Admitting: Neurology

## 2021-03-23 ENCOUNTER — Other Ambulatory Visit: Payer: Self-pay | Admitting: Family Medicine

## 2021-03-23 ENCOUNTER — Telehealth: Payer: Self-pay | Admitting: *Deleted

## 2021-03-23 NOTE — Telephone Encounter (Signed)
Received Order Status Notification on (03/13/21) via of fax from [employer].  Stating:Pricing-Only item covered on this fee sched is tape and we are unable to ship alone.//AB/CMA

## 2021-03-23 NOTE — Telephone Encounter (Signed)
Called on (03/13/21),and [facility] with patient informing her that we received the Order Statue Notification regarding the pricing for the supplies.  Only item covered is the tape.    Informed the patient that [facility] does have discount pricing for their supplies.  But if she does not want to use [facility] she can get supplies at the Medical supply stores.//AB/CMA

## 2021-03-23 NOTE — Telephone Encounter (Signed)
Faxed order on (03/12/21) to [facility] for supplies for the patient.  Confirmation received and copy scanned into the chart.//AB/CMA

## 2021-03-26 ENCOUNTER — Ambulatory Visit: Payer: Medicaid Other | Admitting: Sports Medicine

## 2021-03-27 ENCOUNTER — Other Ambulatory Visit: Payer: Self-pay | Admitting: Family Medicine

## 2021-03-31 NOTE — Progress Notes (Deleted)
    SUBJECTIVE:   CHIEF COMPLAINT / HPI:   ***  This patient with Diabetes and CKD may potentially benefit from an SGLT2 inhibitor  and has an appointment with you on 03/12/2021.   Last EGFR: 61 mL/min     If appropriate, please consider the additional therapy recommendations below:  Consider starting Jardiance or Farxiga for CKD and DM benefit (last A1c 7.0)   This patient with Hyperlipidemia and Diabetes may potentially benefit from additional lipid-lowering therapy to achieve LDL goal of < 100 mg/dL and Triglyceride < $RemoveBefore'150mg'[id number]$ /dl, and has an appointment with you on 03/13/2021.   LDL = 164    Trig = 202  Obtained on 04/30/20   Their current pertinent medications include: atorvastatin 40 mg daily  Documented statin intolerance: None      If appropriate, please consider the additional therapy recommendations below:  (1). Suggest F/u Direct LDL tomorrow or lipid panel in October.    (2). Consider increasing atorvastatin to 80 mg if tolerated by patient and LDL not at goal   - A1c on 01/19/21 is 7.0, increased from 6.6  - last lipid panel 04/30/20; LDL 164, trig 202  - current regimen atorvastatin 40 mg, cholestyramine 4g daily   PERTINENT  PMH / PSH: ***  OBJECTIVE:   There were no vitals taken for this visit.  ***  ASSESSMENT/PLAN:   No problem-specific Assessment & Plan notes found for this encounter.     Ezequiel Essex, MD [place]

## 2021-04-01 ENCOUNTER — Ambulatory Visit: Payer: Medicaid Other | Admitting: Plastic Surgery

## 2021-04-02 ENCOUNTER — Other Ambulatory Visit: Payer: Self-pay

## 2021-04-02 MED ORDER — TRAZODONE HCL 50 MG PO TABS: 50.0000 mg | ORAL_TABLET | Freq: Every evening | ORAL | 0 refills | Status: DC | PRN

## 2021-04-03 ENCOUNTER — Other Ambulatory Visit: Payer: Self-pay

## 2021-04-03 MED ORDER — CHOLESTYRAMINE 4 G PO PACK: PACK | ORAL | 0 refills | Status: DC

## 2021-04-06 ENCOUNTER — Other Ambulatory Visit: Payer: Self-pay | Admitting: *Deleted

## 2021-04-06 DIAGNOSIS — R110 Nausea: Secondary | ICD-10-CM

## 2021-04-07 ENCOUNTER — Ambulatory Visit: Payer: Medicaid Other | Admitting: Sports Medicine

## 2021-04-07 ENCOUNTER — Encounter: Payer: Self-pay | Admitting: Sports Medicine

## 2021-04-07 VITALS — Ht 67.0 in | Wt 323.0 lb

## 2021-04-07 DIAGNOSIS — M25521 Pain in right elbow: Secondary | ICD-10-CM | POA: Diagnosis not present

## 2021-04-07 DIAGNOSIS — M79601 Pain in right arm: Secondary | ICD-10-CM

## 2021-04-07 DIAGNOSIS — M7989 Other specified soft tissue disorders: Secondary | ICD-10-CM | POA: Insufficient documentation

## 2021-04-07 NOTE — Patient Instructions (Signed)
In order to gather more information about the area of your arm causing you pain we will need to obtain both a MRI and xray.   Please go to [facility] located at  [address] for xray of your arm as well as to schedule your MRI of your right arm.   We will plan to follow up with you for your results once they are available.   If it helps to provide some comfort, you can use a sling for your right arm.

## 2021-04-07 NOTE — Progress Notes (Signed)
PCP: Fayette Pho, MD  Subjective:   HPI: Patient is a 60 y.o. female here for right arm pain that has been present for one week.  Patient reports that in the past she has been treated with electrical shock therapy for depression.  She reports that she had damage to her nerve and underwent a procedure with Dr. Janee Morn to have this corrected.  This procedure took place about a year and a half ago she reports.  She states that now she has no sensation in her right third through fifth digits.  She reports normal sensation and mobility of her arm otherwise.  Patient reports that in the past she has had IV access for a MRI and states that at that time she had swelling of her right upper extremity in the antecubital fossa area states that she has had trouble with her arm ever since.  The pain that has been present for the last week has limited her ability to lift objects.  She denies feeling weakness in the right upper extremity but states that the pain is what bothers her most.  She states that she has a "hard" area near her elbow that has been present for about a year.  She states that she sometimes has pain that radiates from her antecubital fossa to the right side of her neck.  Patient reports that she takes 900 mg of gabapentin at bedtime that does not help with this pain.      Objective:  Physical Exam:  Gen: awake, alert, NAD, comfortable in exam room Pulm: breathing unlabored  Upper extremities: - Inspection:+ swelling on RUE in area of right biceps muscle and proximal pronator teres,no erythema nor bruising,linear excoriations on RUE  - Palpation:  TTP in right antecubital fossa, palpable AC fossa induration  - ROM: bilaterally decreased ROM with flexion of upper extremities. No crepitus - Strength: 5/5 strength in wrist flexion and extension without pain. 5/5 strength in biceps, triceps. - Neuro: NV intact distally, decreased ulnar distribution of right upper extremity and hand,  normal sensation in RUE     Assessment & Plan:    Pain and swelling of right upper extremity Given patient's history of multiple procedures in injuries to her right upper extremity, believe that she warrants imaging with both x-ray as well as MRI.  We will also request records from Dr. Carollee Massed office to get more details surrounding the procedure that appears to have involved her ulnar nerve.  Exam notable for induration and antecubital fossa.  If onset of pain and swelling were more acute would have more suspicion for DVT however this has been present for over a year with more recent onset of pain in the last week.  Patient exhibits decreased sensation in ulnar distribution in range of motion limited secondary to pain.  We will plan to follow-up on x-ray and MRI images once available.   Orders Placed This Encounter  Procedures   DG Elbow 2 Views Right    Standing Status:   Future    Standing Expiration Date:   04/07/2022    Order Specific Question:   Reason for Exam (SYMPTOM  OR DIAGNOSIS REQUIRED)    Answer:   right elbow pain    Order Specific Question:   Is patient pregnant?    Answer:   No    Order Specific Question:   Preferred imaging location?    Answer:   [facility]   MR ELBOW RIGHT WO CONTRAST  Standing Status:   Future    Standing Expiration Date:   04/07/2022    Order Specific Question:   What is the patient's sedation requirement?    Answer:   No Sedation    Order Specific Question:   Does the patient have a pacemaker or implanted devices?    Answer:   No    Order Specific Question:   Preferred imaging location?    Answer:   [facility] (table limit-550lbs)    No orders of the defined types were placed in this encounter.   Ronnald Ramp, MD [facility], PGY-3 04/07/2021 4:59 PM   Patient seen and evaluated with the resident.  I agree with the above plan of care.  Physical exam today shows induration of the distal biceps  into the antecubital fossa.  I am suspicious of a soft tissue mass and I recommended imaging in the form of x-ray and MRI to evaluate further.  Phone follow-up with those results when available.  We will delineate treatment based on those findings.  In the meantime I provided her with a sling to wear for comfort and I will also obtain the records from Dr. Carollee Massed office.

## 2021-04-07 NOTE — Assessment & Plan Note (Signed)
Given patient's history of multiple procedures in injuries to her right upper extremity, believe that she warrants imaging with both x-ray as well as MRI.  We will also request records from Dr. Carollee Massed office to get more details surrounding the procedure that appears to have involved her ulnar nerve.  Exam notable for induration and antecubital fossa.  If onset of pain and swelling were more acute would have more suspicion for DVT however this has been present for over a year with more recent onset of pain in the last week.  Patient exhibits decreased sensation in ulnar distribution in range of motion limited secondary to pain.  We will plan to follow-up on x-ray and MRI images once available.

## 2021-04-08 ENCOUNTER — Ambulatory Visit: Payer: Medicaid Other | Admitting: Gastroenterology

## 2021-04-13 ENCOUNTER — Telehealth: Payer: Self-pay | Admitting: Family Medicine

## 2021-04-13 NOTE — Telephone Encounter (Signed)
Called patient to offer [facility] clinic slot on Tues 10/11. Currently 0900 and 1100 are open.   No answer, left HIPAA safe VM. If she calls back please schedule her with myself in [facility] clinic on 10/11.   Thanks,  Fayette Pho, MD

## 2021-04-14 ENCOUNTER — Ambulatory Visit: Payer: Medicaid Other | Admitting: Family Medicine

## 2021-04-15 ENCOUNTER — Telehealth: Payer: Self-pay | Admitting: Family Medicine

## 2021-04-15 ENCOUNTER — Ambulatory Visit: Payer: Medicaid Other | Admitting: Plastic Surgery

## 2021-04-15 NOTE — Telephone Encounter (Signed)
Called patient to offer appointment slot for my [facility] clinic next Tuesday 10/11 with Dr. Deirdre Priest.  Patient amenable, shows the 11 AM slot.  I have scheduled her within the epic system successfully.  Fayette Pho, MD

## 2021-04-16 ENCOUNTER — Telehealth: Payer: Self-pay

## 2021-04-16 NOTE — Telephone Encounter (Signed)
Prior approval for Jardiance completed via [place] [employer].  Med approved for 04/16/2021 - 04/16/2022  Pharmacy called and informed.  Veronda Prude, RN

## 2021-04-20 ENCOUNTER — Other Ambulatory Visit: Payer: Self-pay | Admitting: Family Medicine

## 2021-04-20 DIAGNOSIS — R110 Nausea: Secondary | ICD-10-CM

## 2021-04-21 ENCOUNTER — Ambulatory Visit: Payer: Medicaid Other | Admitting: Family Medicine

## 2021-05-12 ENCOUNTER — Ambulatory Visit
Admission: RE | Admit: 2021-05-12 | Discharge: 2021-05-12 | Disposition: A | Discharge status: Home / Self Care | Payer: Medicaid Other | Source: Ambulatory Visit | Attending: Sports Medicine | Admitting: Sports Medicine

## 2021-05-12 DIAGNOSIS — M25521 Pain in right elbow: Secondary | ICD-10-CM

## 2021-05-18 ENCOUNTER — Ambulatory Visit: Payer: Medicaid Other | Admitting: Gastroenterology

## 2021-05-18 NOTE — Progress Notes (Signed)
Pt contacted with MRI results. There is a soft tissue mass in her elbow which does not appear to be anything bad. Dr. Margaretha Sheffield suggests that she see Dr. Janee Morn about this just to make sure that he does not want to do any further work-up. Pt in agreement with this plan.  Referral to Dr. Janee Morn for right elbow soft tissue mass. [facility] [address], [place] [phone]  Appt: 05/28/21 @ 1:45 pm

## 2021-05-19 ENCOUNTER — Telehealth: Payer: Self-pay | Admitting: Sports Medicine

## 2021-05-19 NOTE — Telephone Encounter (Signed)
-----   Message from [facility], LAT sent at 05/18/2021  1:54 PM EST ----- Regarding: RE: MRI results All set ----- Message ----- From: Ralene Cork, DO Sent: 05/18/2021   9:13 AM EST To: Rutha Bouchard, LAT Subject: MRI results                                    Please call this patient and tell her that I looked at the MRI that she had done recently on her right elbow.  There is a soft tissue mass in her elbow which does not appear to be anything bad but I suggest that she see Dr. Janee Morn about this just to make sure that he does not want to do any further work-up.  The radiologist recommended a repeat MRI in 6 months but I would like for her to see Dr. Janee Morn first (she has seen him in the past and he has operated on her).  ----- Message ----- From: Interface, Rad Results In Sent: 05/13/2021  10:41 AM EST To: Ralene Cork, DO

## 2021-05-19 NOTE — Telephone Encounter (Signed)
  Patient was notified yesterday of MRI results of her right elbow.  After x-rays of the right elbow were unremarkable, we proceeded with an MRI to better evaluate a palpable soft tissue mass in the antecubital fossa.  This appears to be prominent fat tissue but there is no well-defined soft tissue mass.  Radiologist recommends a repeat MRI in 6 months.  However, the patient is a well-established patient of Dr. Janee Morn so I have recommended that she return to him for further evaluation and treatment.  She will follow-up with me as needed.

## 2021-05-20 ENCOUNTER — Telehealth: Payer: Self-pay | Admitting: *Deleted

## 2021-05-20 DIAGNOSIS — J449 Chronic obstructive pulmonary disease, unspecified: Secondary | ICD-10-CM

## 2021-05-20 NOTE — Telephone Encounter (Signed)
Received fax from pharmacy stating that Yesenia [name] has been discontinued from the manufacturer.  Please send new Rx for Ventolin HFA instead if appropriate please send to pharmacy. Skyleigh Windle Zimmerman Rumple, CMA

## 2021-05-21 MED ORDER — ALBUTEROL SULFATE HFA 108 (90 BASE) MCG/ACT IN AERS
2.0000 | INHALATION_SPRAY | Freq: Four times a day (QID) | RESPIRATORY_TRACT | 2 refills | Status: DC | PRN
Start: 2021-05-21 — End: 2021-09-08

## 2021-05-21 NOTE — Telephone Encounter (Signed)
Sent new script to pharmacy.   Fayette Pho, MD

## 2021-06-09 ENCOUNTER — Other Ambulatory Visit: Payer: Self-pay | Admitting: Family Medicine

## 2021-06-10 ENCOUNTER — Other Ambulatory Visit: Payer: Self-pay | Admitting: Nurse Practitioner
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
## 2021-06-11 ENCOUNTER — Other Ambulatory Visit: Payer: Self-pay | Admitting: Nurse Practitioner

## 2021-06-11 ENCOUNTER — Ambulatory Visit: Payer: Medicaid Other | Admitting: Neurology

## 2021-06-11 NOTE — Telephone Encounter (Signed)
Yesenia [name], pls contact patient and schedule her for an office visit as she was last seen in the office 09/2019 and a colonoscopy was recommended at that time but was not done.   After she scheduled a follow up appt with APP or Dr. Adela Lank then you can refill cholestyramine 4gm pack 1 po QD # 30, no refills. Thx

## 2021-06-15 ENCOUNTER — Encounter (HOSPITAL_COMMUNITY): Payer: Medicaid Other

## 2021-06-17 ENCOUNTER — Telehealth: Payer: Self-pay | Admitting: Nurse Practitioner

## 2021-06-17 MED ORDER — CHOLESTYRAMINE 4 G PO PACK: PACK | ORAL | 0 refills | Status: DC

## 2021-06-17 NOTE — Telephone Encounter (Signed)
Refill sent to hold her until her upcoming appointment.

## 2021-06-17 NOTE — Telephone Encounter (Signed)
Patient called requesting a refill of her Cholestyramine to be sent into [facility].  She was told she would have to have an office visit first.  First appointment available is 1/3 with Alcide Evener.  Can you call in enough to last her until that appointment?  If there is any issue with it, please give patient a call.  Thank you.,

## 2021-07-07 ENCOUNTER — Other Ambulatory Visit: Payer: Self-pay | Admitting: Family Medicine

## 2021-07-10 ENCOUNTER — Other Ambulatory Visit: Payer: Self-pay | Admitting: Family Medicine

## 2021-07-14 ENCOUNTER — Ambulatory Visit: Payer: Medicaid Other | Admitting: Nurse Practitioner

## 2021-07-15 ENCOUNTER — Other Ambulatory Visit: Payer: Self-pay | Admitting: Family Medicine

## 2021-07-24 ENCOUNTER — Other Ambulatory Visit: Payer: Self-pay

## 2021-07-24 MED ORDER — AMLODIPINE BESYLATE 5 MG PO TABS: 5.0000 mg | ORAL_TABLET | Freq: Every day | ORAL | 3 refills | Status: DC

## 2021-07-29 ENCOUNTER — Telehealth: Payer: Self-pay | Admitting: Family Medicine

## 2021-07-29 ENCOUNTER — Encounter: Payer: Self-pay | Admitting: Nurse Practitioner

## 2021-07-29 ENCOUNTER — Ambulatory Visit: Payer: Medicaid Other | Admitting: Nurse Practitioner

## 2021-07-29 VITALS — BP 90/70 | HR 128 | Ht 66.0 in | Wt 331.2 lb

## 2021-07-29 DIAGNOSIS — Z86010 Personal history of colonic polyps: Secondary | ICD-10-CM

## 2021-07-29 DIAGNOSIS — Z6841 Body Mass Index (BMI) 40.0 and over, adult: Secondary | ICD-10-CM

## 2021-07-29 DIAGNOSIS — Z7689 Persons encountering health services in other specified circumstances: Secondary | ICD-10-CM | POA: Diagnosis not present

## 2021-07-29 MED ORDER — CHOLESTYRAMINE 4 G PO PACK: PACK | ORAL | 1 refills | Status: DC

## 2021-07-29 NOTE — Telephone Encounter (Signed)
-----   Message from Arnaldo Natal, NP sent at 07/29/2021  4:54 PM EST ----- Dr. Larita Fife, patient was instructed to contact you today regarding her heart rate 128 and low blood pressure.  She was not in any acute distress.  She was asymptomatic.  She was advised to go the ED if she develops chest pain, dizziness, palpitations or shortness of breath.

## 2021-07-29 NOTE — Patient Instructions (Addendum)
It was great seeing you today! Thank you for entrusting me with your care and choosing [facility].  Arnaldo Natal, CRNP  It has been recommended to you by your physician that you have a(n) Colonoscopy completed. Per your request, we did not schedule the procedure(s) today. Please contact our office at [phone] should you decide to have the procedure completed. You will be scheduled for a pre-visit and procedure at that time.  Follow up with your primary care provider regarding low blood pressure and high heart rate. Go to the emergency room if you develop dizziness, shortness of breath, lightheaded or chest pain.  We have refilled your Cholestyramine.  The [facility] providers would like to encourage you to use [facility] to communicate with providers for non-urgent requests or questions.  Due to long hold times on the telephone, sending your provider a message by [facility] may be faster and more efficient way to get a response. Please allow 48 business hours for a response.  Please remember that this is for non-urgent requests/questions. If you are age 65 or older, your body mass index should be between 23-30. Your Body mass index is 53.47 kg/m. If this is out of the aforementioned range listed, please consider follow up with your Primary Care Provider.  If you are age 21 or younger, your body mass index should be between 19-25. Your Body mass index is 53.47 kg/m. If this is out of the aformentioned range listed, please consider follow up with your Primary Care Provider.

## 2021-07-29 NOTE — Progress Notes (Addendum)
07/29/2021 Yesenia [name] [id number] May 11, 1961   Chief Complaint: Cholestyramine refill   History of Present Illness: Yesenia [name] is a 61 year old female with a past medical history of arthritis, fibromyalgia, depression, hypertension, CVA x 2, 1990's and 2016 or 2017, DM II, CKD stage III, anemia,  past tracheostomy secondary to OSA could not tolerate cpap, gastric ulcers and colon polyps.  I last saw Yesenia [name] in the office on 10/09/2019 for follow-up regarding chronic diarrhea.  At that time, her 03/31/2018 colonoscopy result was reviewed which resulted in a poor prep and a repeat colonoscopy was ordered but was not completed.   She presents her office today to refill Cholestyramine which controls her diarrhea.  She takes Cholestyramine 4 g packet once daily.  She typically passes a normal formed brown bowel movement about 5 days weekly and has loose stool once or twice weekly.  No rectal bleeding or black stools.  No abdominal pain but has intermittent abdominal bloat relieved by taking Gas-X.  No dysphagia or heartburn symptoms.    Her heart rate in office today is 128 b/m. Blood pressure 90/70.  Repeat heart rate 119.  She denies having any chest pain, palpitations or dizziness.  She has some shortness of breath when she lays flat.  CBC Latest Ref Rng & Units 02/09/2021 05/02/2020 04/30/2020  WBC 4.0 - 10.5 K/uL 7.9 9.1 8.1  Hemoglobin 12.0 - 15.0 g/dL 13.1 11.4(L) 11.6(L)  Hematocrit 36.0 - 46.0 % 41.1 36.5 37.8  Platelets 150 - 400 K/uL 373 328 337    CMP Latest Ref Rng & Units 03/13/2021 02/09/2021 06/27/2020  Glucose 65 - 99 mg/dL 109(H) 165(H) 149(H)  BUN 6 - 24 mg/dL 14 17 19   Creatinine 0.57 - 1.00 mg/dL 1.19(H) 1.21(H) 1.15(H)  Sodium 134 - 144 mmol/L 144 140 144  Potassium 3.5 - 5.2 mmol/L 4.8 4.1 5.0  Chloride 96 - 106 mmol/L 108(H) 107 110(H)  CO2 20 - 29 mmol/L 21 23 19(L)  Calcium 8.7 - 10.2 mg/dL 9.1 9.3 9.2  Total Protein 6.5 - 8.1 g/dL - - -  Total Bilirubin 0.3  - 1.2 mg/dL - - -  Alkaline Phos 38 - 126 U/L - - -  AST 15 - 41 U/L - - -  ALT 0 - 44 U/L - - -     EGD 01/31/2018: - Esophagogastric landmarks identified. - 2 cm hiatal hernia. - Erythematous mucosa in the antrum. - Normal stomach otherwise. Biopsies taken to rule out H pylori. - Normal duodenal bulb and second portion of the duodenum. Biopsies negative for Celiac disease.    EGD 08/16/2017: 3-57mm clean based gastric ulcer, gastritis, a few nonbleeding duodenal ulcers, biopsies negative for H pylori  Colonoscopy 03/31/2018: Preparation of the colon was poor and inadequate for screening purposes. - Of what was seen of the colonic mucosa, no inflammatory changes were noted. - Biopsies were taken with a cold forceps from the right colon and left colon for evaluation of microscopic colitis.   Colonoscopy 2013:  Dr. Benson Norway, few small adenomas  Current Medications, Allergies, Past Medical History, Past Surgical History, Family History and Social History were reviewed in [facility] record.   Review of Systems:   Constitutional: Negative for fever, sweats, chills or weight loss.  Respiratory: Negative for shortness of breath.   Cardiovascular: Negative for chest pain, palpitations and leg swelling.  Gastrointestinal: See HPI.  Musculoskeletal: Negative for back pain or muscle aches.  Neurological:  Negative for dizziness, headaches or paresthesias.    Physical Exam: BP 90/70 (BP Location: Left Arm, Patient Position: Sitting, Cuff Size: Large)    Pulse (!) 128    Ht 5\' 6"  (1.676 m) Comment: height measured without shoes   Wt (!) 331 lb 4 oz (150.3 kg)    BMI 53.47 kg/m repeat heart rate 119. General: 61 year old obese female in no acute distress. Head: Normocephalic and atraumatic. Eyes: No scleral icterus. Conjunctiva pink . Ears: Normal auditory acuity. Mouth: Dentition intact. No ulcers or lesions.  Neck: Past trach scar intact. Lungs: Clear throughout to  auscultation. Heart: Tachycardic, regular rhythm, no murmur. Abdomen: Obese abdomen. Soft, nontender and nondistended. No masses or hepatomegaly. Normal bowel sounds x 4 quadrants.  Rectal: Deferred. Musculoskeletal: Symmetrical with no gross deformities. Extremities: No edema. Neurological: Alert oriented x 4. No focal deficits.  Psychological: Alert and cooperative. Normal mood and affect  Assessment and Recommendations:  78) 61 year old female with chronic diarrhea she is well controlled on cholestyramine 4 g packet daily -Continue cholestyramine 4 g packet mixed in 4 ounces of clear liquid daily, not to take within 4 hours of any other medication  2) History of adenomatous colon polyps per colonoscopy by Dr. Benson Norway in 2013.  Colonoscopy 03/31/2018 resulted in a poor prep.  Repeat colonoscopy recommended but has not been completed. -Colonoscopy at [facility] with a 2-day bowel prep benefits and risks discussed including risk with sedation, risk of bleeding, perforation and infection.  Patient declined to schedule a colonoscopy at this time as she will need to check with her daughter schedule. -Patient's heart rate was elevated at 128 with blood pressure 90/70 in office today, she was asymptomatic.  Patient advised to contact her PCP today for further evaluation.  She remains tachycardic and hypotensive she will require cardiac clearance prior to proceeding with a colonoscopy. See further recommendations below.  3) Heart rate 128 b/min in office today with BP 90/70. Repeat HR 119 b/min.  Patient is asymptomatic.  Patient was advised to contact her primary care physician today regarding elevated heart rate and low blood pressure.  She is on Amlodipine and Atenolol.  Patient instructed go to the emergency room if she develops chest pain, dizziness, palpitations or shortness of breath.  4) History of gastric ulcers 08/2017.  No longer on PPI.  No GERD symptoms.  5) History of obstructive  sleep apnea intolerant to CPAP, required a tracheostomy until 2016.    6) CVA x 2

## 2021-07-29 NOTE — Telephone Encounter (Signed)
Received note from GI that this patient was borderline hypotensive and tachycardic at her visit with them today. Attempted to call patient to instruct her to stop her amlodipine and come in for an appointment. No answer, left HIPAA safe VM.   If calls back, please schedule her for [facility] clinic Thursday or Friday.   Fayette Pho, MD

## 2021-07-31 ENCOUNTER — Telehealth: Payer: Self-pay

## 2021-07-31 NOTE — Telephone Encounter (Signed)
-----   Message from Benancio Deeds, MD sent at 07/31/2021  7:42 AM EST ----- Jill Side looks like Dr. Jerolyn Center office tried contacting the patient and told her to come in and stop the amlodipine but has not reached her.  Reagan Behlke can you contact this patient to check in on her to see how she is feeling? She needs to stop amlodipine and contact Dr. Jerolyn Center office. Thanks  ----- Message ----- From: Arnaldo Natal, NP Sent: 07/29/2021   4:54 PM EST To: Benancio Deeds, MD, Fayette Pho, MD  Dr. Larita Fife, patient was instructed to contact you today regarding her heart rate 128 and low blood pressure.  She was not in any acute distress.  She was asymptomatic.  She was advised to go the ED if she develops chest pain, dizziness, palpitations or shortness of breath.

## 2021-07-31 NOTE — Telephone Encounter (Signed)
[place] thanks very much for the follow up

## 2021-07-31 NOTE — Progress Notes (Signed)
°  Patient was told to follow up with Dr. Jeani Hawking who was contacted. Their office has tried to contact the patient, she should hold amlodipine, check BP at home if she can and follow up with Dr. Jeani Hawking ASAP for reassessment or go to ED if any symptoms / feeling poorly. Our office will reach out to her today to make sure she is feeling okay.

## 2021-07-31 NOTE — Telephone Encounter (Signed)
Called and spoke with patient. She states that she has pressure in her chest and always has SOB. She denies any chest pain, dizziness, or other symptoms. She states that she stopped the Amlodipine the day of her office visit here. I have told pt that Dr. Jerolyn Center office has tried to reach her several times. I asked that she give them a call so that she can be evaluated for low BP and elevated HR. Pt stated that she would call Dr. Jerolyn Center office today. Pt had no other concerns at the end of the call.

## 2021-08-07 ENCOUNTER — Telehealth: Payer: Self-pay | Admitting: Nurse Practitioner

## 2021-08-07 NOTE — Telephone Encounter (Signed)
Beth, pls contact the patient, refer to office visit 07/29/2021. At that time her HR was 128 b/min. She was to follow up with her PCP.  Dr. Ezequiel Essex PCP attempted to contact patient for follow-up, see phone calls in epic but looks like the patient was not reached and did not return Dr.Lynn's call.  Please contact the patient to ensure she has appropriate follow-up with her primary care physician.  If she continues to have an elevated heart rate she will require a full cardiac clearance prior to scheduling a colonoscopy at [facility].  Please let me know outcome.     Thank you

## 2021-08-07 NOTE — Telephone Encounter (Signed)
Spoke with the patient. She said she is in touch with Dr Larita Fife and has an appointment on  08/12/21. Thanks you for following up with her.

## 2021-08-11 NOTE — Progress Notes (Signed)
SUBJECTIVE:   CHIEF COMPLAINT / HPI:   HTN follow up  BP Readings from Last 3 Encounters:  08/12/21 135/73  07/29/21 90/70  03/13/21 128/74    - At GI office 1/18, was noted to have hypotension and tachycardia - I received a note from the GI office notify me of this, reached out to patient (see phone note 1/18) and instructed her to stop amlodipine and follow-up - No blood pressure cuff at home to measure blood pressures - No episodes of orthostasis at home, however has been experiencing below symptoms  Fatigue, ageusia - 2-week duration of extreme fatigue affecting ADLs -Started right around the time she was found to have abnormal vitals at the GI office 1/18 - Dyspnea on exertion - Vomited x2 yesterday, nauseous on and off - Able to keep some food down - No fever, cough, rhinorrhea, congestion, diarrhea -No known COVID or flu exposures  PERTINENT  PMH / PSH:  Patient Active Problem List   Diagnosis Date Noted   Pain and swelling of right upper extremity 04/07/2021   Right arm pain 03/15/2021   Left hand weakness 03/15/2021   Disordered sleep 08/14/2020   Graves disease 12/13/2018   Healthcare maintenance 12/08/2018   Anxiety 12/08/2018   Chronic obstructive lung disease ([place]) 12/06/2018   Hyperlipidemia 12/06/2018   Neuropathy 12/06/2018   Chronic right-sided low back pain without sciatica 12/06/2018   Osteoarthritis of both knees 05/25/2018   Hyperthyroidism 04/20/2018   Hepatic steatosis 08/24/2017   Vitamin D deficiency 08/23/2017   Hyperlipidemia associated with type 2 diabetes mellitus ([place]) 08/23/2017   Nausea and vomiting    MDD (major depressive disorder), recurrent severe, without psychosis ([place])    Primary fibromyalgia syndrome 02/22/2016   CKD (chronic kidney disease), stage III ([place]) 02/16/2016   Morbid obesity with BMI of 50.0-59.9, adult ([place]) 01/19/2016   Chronic pain 10/15/2015   Circadian dysregulation 08/10/2015   Polyneuropathy 07/10/2015    Gastro-esophageal reflux disease without esophagitis 07/10/2015   Type 2 diabetes mellitus with peripheral neuropathy ([place]) 04/11/2013   Cerebrovascular accident ([place]) 01/14/2013   Hypertension associated with diabetes ([place]) 01/14/2013   Anemia 01/14/2013     OBJECTIVE:   BP 135/73    Pulse 71    Ht 5\' 6"  (1.676 m)    Wt (!) 321 lb 12.8 oz (146 kg)    SpO2 95%    BMI 51.94 kg/m    PHQ-9:  Depression screen [facility] 2/9 08/12/2021 03/13/2021 01/19/2021  Decreased Interest 2 2 1   Down, Depressed, Hopeless 0 0 0  PHQ - 2 Score 2 2 1   Altered sleeping 1 2 1   Tired, decreased energy 3 2 1   Change in appetite 2 2 1   Feeling bad or failure about yourself  0 0 0  Trouble concentrating 2 0 0  Moving slowly or fidgety/restless 0 3 0  Suicidal thoughts 0 0 0  PHQ-9 Score 10 11 4   Difficult doing work/chores - - -  Some recent data might be hidden     GAD-7: No flowsheet data found.   Physical Exam General: Awake, alert, oriented HEENT: Oral mucosa slightly tacky Cardiovascular: Regular rate and rhythm, S1 and S2 present, no murmurs auscultated Respiratory: Lung fields clear to auscultation bilaterally Extremities: Brisk cap refill, normal skin turgor  ASSESSMENT/PLAN:   Hypertension associated with diabetes (HCC) BP at goal while holding amlodipine.  Continue to hold amlodipine.  Recheck in 2 weeks.  Nausea and vomiting Acute, associated with 2-week  history of fatigue, ageusia, hypotension, and tachycardia.  Likely due to viral illness, suspect COVID.  Given how far out from symptom onset, declined to test at this time.  Discussed with patient.  Conservative measures discussed, Rx Zofran.  Follow-up in 2 weeks or sooner if worsened.     Ezequiel Essex, MD [place]

## 2021-08-12 ENCOUNTER — Other Ambulatory Visit: Payer: Self-pay

## 2021-08-12 ENCOUNTER — Ambulatory Visit (INDEPENDENT_AMBULATORY_CARE_PROVIDER_SITE_OTHER): Payer: Medicaid Other | Admitting: Family Medicine

## 2021-08-12 ENCOUNTER — Encounter: Payer: Self-pay | Admitting: Family Medicine

## 2021-08-12 VITALS — BP 135/73 | HR 71 | Ht 66.0 in | Wt 321.8 lb

## 2021-08-12 DIAGNOSIS — E1142 Type 2 diabetes mellitus with diabetic polyneuropathy: Secondary | ICD-10-CM

## 2021-08-12 DIAGNOSIS — Z7689 Persons encountering health services in other specified circumstances: Secondary | ICD-10-CM | POA: Diagnosis not present

## 2021-08-12 DIAGNOSIS — R112 Nausea with vomiting, unspecified: Secondary | ICD-10-CM | POA: Diagnosis not present

## 2021-08-12 DIAGNOSIS — I152 Hypertension secondary to endocrine disorders: Secondary | ICD-10-CM

## 2021-08-12 DIAGNOSIS — E1159 Type 2 diabetes mellitus with other circulatory complications: Secondary | ICD-10-CM

## 2021-08-12 LAB — POCT GLYCOSYLATED HEMOGLOBIN (HGB A1C): HbA1c, POC (controlled diabetic range): 7.6 % — AB (ref 0.0–7.0)

## 2021-08-12 MED ORDER — ONDANSETRON HCL 8 MG PO TABS: 8.0000 mg | ORAL_TABLET | Freq: Three times a day (TID) | ORAL | 0 refills | Status: DC | PRN

## 2021-08-12 NOTE — Assessment & Plan Note (Signed)
BP at goal while holding amlodipine.  Continue to hold amlodipine.  Recheck in 2 weeks.

## 2021-08-12 NOTE — Assessment & Plan Note (Signed)
Acute, associated with 2-week history of fatigue, ageusia, hypotension, and tachycardia.  Likely due to viral illness, suspect COVID.  Given how far out from symptom onset, declined to test at this time.  Discussed with patient.  Conservative measures discussed, Rx Zofran.  Follow-up in 2 weeks or sooner if worsened.

## 2021-08-12 NOTE — Patient Instructions (Signed)
It was wonderful to see you today. Thank you for allowing me to be a part of your care. Below is a short summary of what we discussed at your visit today:  Blood pressure Your blood pressure today was normal without the amlodipine.  I think for now you do not need to take amlodipine.  Do not restart.  Fatigue, lack of taste It sounds like you unfortunately caught a viral illness.  Fatigue from this can last many weeks.  Today we checked an EKG to look at your heart, this was normal so I sent in a prescription of your nausea medicine Zofran.  You pick this up on the way home from your pharmacy.  I would recommend pushing fluids (water, Pedialyte, Gatorade).  Focus on hydration over food.  Please come back in 2 weeks for a checkup to make sure that you are improving, or sooner if your symptoms are worsening.   Please bring all of your medications to every appointment!  If you have any questions or concerns, please do not hesitate to contact us via phone or MyChart message.   Fayette Pho, MD

## 2021-08-18 ENCOUNTER — Other Ambulatory Visit: Payer: Self-pay | Admitting: Family Medicine

## 2021-08-24 ENCOUNTER — Other Ambulatory Visit: Payer: Self-pay | Admitting: Family Medicine

## 2021-09-01 ENCOUNTER — Other Ambulatory Visit: Payer: Self-pay | Admitting: Family Medicine

## 2021-09-04 DIAGNOSIS — Z7689 Persons encountering health services in other specified circumstances: Secondary | ICD-10-CM | POA: Diagnosis not present

## 2021-09-04 DIAGNOSIS — E0590 Thyrotoxicosis, unspecified without thyrotoxic crisis or storm: Secondary | ICD-10-CM | POA: Diagnosis not present

## 2021-09-07 ENCOUNTER — Ambulatory Visit: Payer: Medicaid Other | Admitting: Neurology

## 2021-09-08 ENCOUNTER — Other Ambulatory Visit: Payer: Self-pay | Admitting: Family Medicine

## 2021-09-08 DIAGNOSIS — J449 Chronic obstructive pulmonary disease, unspecified: Secondary | ICD-10-CM

## 2021-10-02 ENCOUNTER — Other Ambulatory Visit: Payer: Self-pay | Admitting: Family Medicine

## 2021-10-05 ENCOUNTER — Other Ambulatory Visit: Payer: Self-pay | Admitting: Family Medicine

## 2021-10-07 ENCOUNTER — Telehealth: Payer: Self-pay

## 2021-10-07 NOTE — Telephone Encounter (Signed)
Patient calls nurse line reporting neck and arm pain.  ? ?Patient reports the pain started on the left side of her neck and has now radiated down her left arm. Patient reports the pain has been present for ~ 1 week.  ? ?Patient reports intermittent "throbbing" pain. Patient reports taking her "everyday" medications with minimal relief. ? ?Patient denies any injury or bruising. Patient denies any chest pains or SOB.  ? ?Patient scheduled for 4/3 for evaluation. Sooner apt offered, however transportation is a barrier.  ? ?Red flags discussed with patient for emergent care.  ?

## 2021-10-12 ENCOUNTER — Other Ambulatory Visit: Payer: Self-pay

## 2021-10-12 ENCOUNTER — Encounter (HOSPITAL_COMMUNITY): Payer: Self-pay

## 2021-10-12 ENCOUNTER — Emergency Department (HOSPITAL_COMMUNITY)
Admission: EM | Admit: 2021-10-12 | Discharge: 2021-10-13 | Disposition: A | Discharge status: Home / Self Care | Payer: Medicaid Other | Attending: Emergency Medicine | Admitting: Emergency Medicine

## 2021-10-12 ENCOUNTER — Encounter: Payer: Self-pay | Admitting: Family Medicine

## 2021-10-12 ENCOUNTER — Emergency Department (HOSPITAL_COMMUNITY): Payer: Medicaid Other

## 2021-10-12 ENCOUNTER — Ambulatory Visit (INDEPENDENT_AMBULATORY_CARE_PROVIDER_SITE_OTHER): Payer: Medicaid Other | Admitting: Family Medicine

## 2021-10-12 VITALS — BP 101/76 | HR 93 | Ht 66.0 in | Wt 325.2 lb

## 2021-10-12 DIAGNOSIS — I129 Hypertensive chronic kidney disease with stage 1 through stage 4 chronic kidney disease, or unspecified chronic kidney disease: Secondary | ICD-10-CM | POA: Diagnosis not present

## 2021-10-12 DIAGNOSIS — M542 Cervicalgia: Secondary | ICD-10-CM

## 2021-10-12 DIAGNOSIS — Z79899 Other long term (current) drug therapy: Secondary | ICD-10-CM | POA: Insufficient documentation

## 2021-10-12 DIAGNOSIS — M5412 Radiculopathy, cervical region: Secondary | ICD-10-CM | POA: Diagnosis not present

## 2021-10-12 DIAGNOSIS — N189 Chronic kidney disease, unspecified: Secondary | ICD-10-CM | POA: Insufficient documentation

## 2021-10-12 DIAGNOSIS — Z7689 Persons encountering health services in other specified circumstances: Secondary | ICD-10-CM | POA: Diagnosis not present

## 2021-10-12 LAB — CBC WITH DIFFERENTIAL/PLATELET
Abs Immature Granulocytes: 0.08 10*3/uL — ABNORMAL HIGH (ref 0.00–0.07)
Basophils Absolute: 0.1 10*3/uL (ref 0.0–0.1)
Basophils Relative: 1 %
Eosinophils Absolute: 0.2 10*3/uL (ref 0.0–0.5)
Eosinophils Relative: 1 %
HCT: 40.5 % (ref 36.0–46.0)
Hemoglobin: 12.5 g/dL (ref 12.0–15.0)
Immature Granulocytes: 1 %
Lymphocytes Relative: 28 %
Lymphs Abs: 3.6 10*3/uL (ref 0.7–4.0)
MCH: 26.4 pg (ref 26.0–34.0)
MCHC: 30.9 g/dL (ref 30.0–36.0)
MCV: 85.4 fL (ref 80.0–100.0)
Monocytes Absolute: 0.9 10*3/uL (ref 0.1–1.0)
Monocytes Relative: 7 %
Neutro Abs: 8 10*3/uL — ABNORMAL HIGH (ref 1.7–7.7)
Neutrophils Relative %: 62 %
Platelets: 357 10*3/uL (ref 150–400)
RBC: 4.74 MIL/uL (ref 3.87–5.11)
RDW: 15.2 % (ref 11.5–15.5)
WBC: 12.9 10*3/uL — ABNORMAL HIGH (ref 4.0–10.5)
nRBC: 0 % (ref 0.0–0.2)

## 2021-10-12 LAB — BASIC METABOLIC PANEL
Anion gap: 10 (ref 5–15)
BUN: 23 mg/dL — ABNORMAL HIGH (ref 6–20)
CO2: 24 mmol/L (ref 22–32)
Calcium: 8.9 mg/dL (ref 8.9–10.3)
Chloride: 107 mmol/L (ref 98–111)
Creatinine, Ser: 1.3 mg/dL — ABNORMAL HIGH (ref 0.44–1.00)
GFR, Estimated: 47 mL/min — ABNORMAL LOW (ref >60)
Glucose, Bld: 165 mg/dL — ABNORMAL HIGH (ref 70–99)
Potassium: 4.3 mmol/L (ref 3.5–5.1)
Sodium: 141 mmol/L (ref 135–145)

## 2021-10-12 MED ORDER — HYDROCODONE-ACETAMINOPHEN 5-325 MG PO TABS
2.0000 | ORAL_TABLET | Freq: Once | ORAL | Status: AC
  Administered 2021-10-12: 2 via ORAL
  Filled 2021-10-12: qty 2

## 2021-10-12 MED ORDER — IOHEXOL 300 MG/ML  SOLN
75.0000 mL | Freq: Once | INTRAMUSCULAR | Status: AC | PRN
  Administered 2021-10-12: 75 mL via INTRAVENOUS

## 2021-10-12 MED ORDER — METHOCARBAMOL 500 MG PO TABS
500.0000 mg | ORAL_TABLET | Freq: Once | ORAL | Status: AC
  Administered 2021-10-12: 500 mg via ORAL
  Filled 2021-10-12: qty 1

## 2021-10-12 MED ORDER — ACETAMINOPHEN 325 MG PO TABS
650.0000 mg | ORAL_TABLET | Freq: Once | ORAL | Status: AC
  Administered 2021-10-12: 650 mg via ORAL
  Filled 2021-10-12: qty 2

## 2021-10-12 MED ORDER — FENTANYL CITRATE PF 50 MCG/ML IJ SOSY: 50.0000 ug | PREFILLED_SYRINGE | INTRAMUSCULAR | Status: DC | PRN

## 2021-10-12 NOTE — Discharge Instructions (Addendum)
Your CT scan was reviewed and no abnormalities.  Please follow-up closely with your primary doctor and orthopedics for reassessment. ?Take tylenol every 4 hrs for pain. ?For muscle spasm try flexeril.  ?Return for new or worsening signs or symptoms. ?

## 2021-10-12 NOTE — Progress Notes (Addendum)
? ? ?  SUBJECTIVE:  ? ?CHIEF COMPLAINT / HPI: Neck pain ? ?Patient reports left-sided neck pain ongoing for 1 and half weeks.  Pain starts on side of neck traveling along trapezius muscle towards left shoulder.  Also extends anteriorly around the neck but does not cross trachea midline.  Took Tylenol without relief.  Denies any trauma, fevers, shortness of breath, chest pain.  Initially denied any difficulty swallowing but when attending present reported some difficulty swallowing.  Now endorsing some nausea. ? ?PERTINENT  PMH / PSH:  ?Diabetes type 2 ?Hypertension ?CVA ?COPD ?Hyperlipidemia ?Graves' disease ?Polyneuropathy ?Chronic pain ? ? ?OBJECTIVE:  ? ?BP 101/76   Pulse 93   Ht 5\' 6"  (1.676 m)   Wt (!) 325 lb 3.2 oz (147.5 kg)   SpO2 97%   BMI 52.49 kg/m?   ? ?General: Alert, in significant pain ?HEENT: Poor dentition with severe halitosis and gingivitis. No pharyngeal erythema or exudate. ?NECK:  No erythema, ecchymosis or significant edema appreciated. Decreased Flexion/Extension and lateral bend, more notable on left side.  Significant pain to left side of neck extending anteriorly not beyond tracheal midline.  No cervical point tenderness.  ?Spurlings negative ?Cardio: Normal S1 and S2, RRR, no r/m/g ?Pulm: CTAB, normal work of breathing ? ?ASSESSMENT/PLAN:  ? ?Acute neck pain ?Differentials include acute torticollis, soft tissue abscess.   On exam patient exhibits extreme tenderness on left side of neck and anteriorly.  No cervical lymphadenopathy appreciated on exam but difficult to exam given pain.   ?Likely MSK but given severe pain, difficulty swallowing and h/o DM, concerned for possible infectious etiology discussed with patient evaluation in ED with CT soft tissue neck. ?Notified ED charge RN and patient transported to ED via wheelchair accompanied by CMA ?  ? ? ?Carollee Leitz, MD ?[place]  ?

## 2021-10-12 NOTE — ED Provider Notes (Signed)
?[facility] EMERGENCY DEPARTMENT ?Provider Note ? ? ?CSN: [id number] ?Arrival date & time: 10/12/21  1637 ? ?  ? ?History ? ?Chief Complaint  ?Patient presents with  ? Neck Injury  ? ? ?Yesenia [name] is a 61 y.o. female. ? ?Patient presents with left-sided neck pain for a week and a half no specific injuries recalled.  Pain constant and patient has mild numbness tingling and pain shooting down left arm.  No history of similar, no history of known disc herniation or radicular symptoms.  Patient has chronic kidney disease, high blood pressure, cholesterol.  Patient has history of fibromyalgia, sleep apnea and chronic pain.  Pain also worse with position and movement.  No weakness in extremities.  No chest pain or shortness of breath or fevers.  Pain starts left neck/lateral area. ? ? ?  ? ?Home Medications ?Prior to Admission medications   ?Medication Sig Start Date End Date Taking? Authorizing Provider  ?cyclobenzaprine (FLEXERIL) 10 MG tablet Take 1 tablet (10 mg total) by mouth 2 (two) times daily as needed for muscle spasms. 10/13/21  Yes Blane Ohara, MD  ?hydrOXYzine (ATARAX) 50 MG tablet Take 1.5 tablets (75 mg total) by mouth at bedtime. 09/02/21   Fayette Pho, MD  ?atenolol (TENORMIN) 100 MG tablet Take 1 tablet (100 mg total) by mouth daily. 02/12/21   Fayette Pho, MD  ?atorvastatin (LIPITOR) 80 MG tablet Take 1 tablet (80 mg total) by mouth daily. 03/13/21   Fayette Pho, MD  ?Blood Pressure Monitoring (BLOOD PRESSURE MON/AUTO/WRIST) DEVI 1 Units by Does not apply route daily. 08/23/19   Melene Plan, MD  ?cholestyramine Lanetta Inch) 4 g packet Use one 4 gm packed mixed into 4-6 oz of clear liquid daily. Do not take within 4 hours of any other medication. 07/29/21   Arnaldo Natal, NP  ?Diaper Rash Products (DESITIN MULTI-PURPOSE HEALING) OINT Apply 1 application topically 2 (two) times daily. ?Patient taking differently: Apply 1 application topically daily as needed (Rash).  07/27/19   Melene Plan, MD  ?diclofenac Sodium (VOLTAREN) 1 % GEL Apply 2 g topically 4 (four) times daily. 05/02/20   Katha Cabal, DO  ?empagliflozin (JARDIANCE) 10 MG TABS tablet Take 1 tablet (10 mg total) by mouth daily. 03/13/21   Fayette Pho, MD  ?fluticasone furoate-vilanterol (BREO ELLIPTA) 100-25 MCG/INH AEPB Inhale 1 puff into the lungs daily. 08/08/19   Parrett, Virgel Bouquet, NP  ?gabapentin (NEURONTIN) 300 MG capsule TAKE THREE CAPSULES BY MOUTH AT BEDTIME 10/06/21   Fayette Pho, MD  ?hydrOXYzine (ATARAX) 25 MG tablet TAKE ONE TABLET BY MOUTH EVERY SIX HOURS AS NEEDED FOR ANXIETY 08/24/21   Fayette Pho, MD  ?methimazole (TAPAZOLE) 10 MG tablet Take 1.5 tablets (15 mg total) by mouth daily. ?Patient taking differently: Take 10 mg by mouth daily. 11/13/20   Melene Plan, MD  ?ondansetron (ZOFRAN) 8 MG tablet Take 1 tablet (8 mg total) by mouth every 8 (eight) hours as needed for nausea or vomiting. 08/12/21   Fayette Pho, MD  ?ondansetron (ZOFRAN-ODT) 8 MG disintegrating tablet Take 1 tablet (8 mg total) by mouth every 8 (eight) hours as needed for nausea or vomiting. No additional refill until EKG at clinic. 04/20/21   Fayette Pho, MD  ?ramelteon (ROZEREM) 8 MG tablet Take 1 tablet (8 mg total) by mouth at bedtime as needed for sleep. 07/07/21 07/07/22  Fayette Pho, MD  ?traZODone (DESYREL) 50 MG tablet TAKE ONE TABLET BY MOUTH AT BEDTIME AS NEEDED SLEEP  10/02/21   Fayette Pho, MD  ?VENTOLIN HFA 108 (90 Base) MCG/ACT inhaler Inhale 2 puffs into the lungs every 6 (six) hours as needed for wheezing or shortness of breath. 09/08/21   Fayette Pho, MD  ?   ? ?Allergies    ?Buprenorphine hcl, Ibuprofen, and Morphine and related   ? ?Review of Systems   ?Review of Systems  ?Constitutional:  Negative for chills and fever.  ?HENT:  Negative for congestion.   ?Eyes:  Negative for visual disturbance.  ?Respiratory:  Negative for shortness of breath.   ?Cardiovascular:  Negative for chest pain.   ?Gastrointestinal:  Negative for abdominal pain and vomiting.  ?Genitourinary:  Negative for dysuria and flank pain.  ?Musculoskeletal:  Positive for neck pain. Negative for back pain and neck stiffness.  ?Skin:  Negative for rash.  ?Neurological:  Positive for numbness. Negative for syncope, speech difficulty, weakness, light-headedness and headaches.  ? ?Physical Exam ?Updated Vital Signs ?BP 125/66 (BP Location: Right Arm)   Pulse 67   Temp 98.7 ?F (37.1 ?C) (Oral)   Resp 15   SpO2 97%  ?Physical Exam ?Vitals and nursing note reviewed.  ?Constitutional:   ?   General: She is not in acute distress. ?   Appearance: She is well-developed.  ?HENT:  ?   Head: Normocephalic and atraumatic.  ?   Mouth/Throat:  ?   Mouth: Mucous membranes are moist.  ?Eyes:  ?   General:     ?   Right eye: No discharge.     ?   Left eye: No discharge.  ?   Conjunctiva/sclera: Conjunctivae normal.  ?Neck:  ?   Trachea: No tracheal deviation.  ?Cardiovascular:  ?   Rate and Rhythm: Normal rate and regular rhythm.  ?   Pulses:     ?     Radial pulses are 2+ on the right side and 2+ on the left side.  ?Pulmonary:  ?   Effort: Pulmonary effort is normal.  ?   Breath sounds: Normal breath sounds.  ?Abdominal:  ?   General: There is no distension.  ?   Palpations: Abdomen is soft.  ?   Tenderness: There is no abdominal tenderness. There is no guarding.  ?Musculoskeletal:  ?   Cervical back: Normal range of motion and neck supple. No rigidity.  ?   Comments: Patient has mild tenderness to palpation left lateral neck, paraspinal cervical left anterior.  No swelling, pulsatile mass, external signs of infection.  ?Skin: ?   General: Skin is warm.  ?   Capillary Refill: Capillary refill takes less than 2 seconds.  ?   Findings: No rash.  ?Neurological:  ?   General: No focal deficit present.  ?   Mental Status: She is alert.  ?   Cranial Nerves: No cranial nerve deficit.  ?   Comments: Patient has 5+ strength of flexion extension of major  joints in the upper extremities bilateral.  Sensation intact bilateral mild decreased left arm versus right.  ?Psychiatric:     ?   Mood and Affect: Mood normal.  ? ? ?ED Results / Procedures / Treatments   ?Labs ?(all labs ordered are listed, but only abnormal results are displayed) ?Labs Reviewed  ?BASIC METABOLIC PANEL - Abnormal; Notable for the following components:  ?    Result Value  ? Glucose, Bld 165 (*)   ? BUN 23 (*)   ? Creatinine, Ser 1.30 (*)   ? GFR, Estimated 47 (*)   ?  All other components within normal limits  ?CBC WITH DIFFERENTIAL/PLATELET - Abnormal; Notable for the following components:  ? WBC 12.9 (*)   ? Neutro Abs 8.0 (*)   ? Abs Immature Granulocytes 0.08 (*)   ? All other components within normal limits  ? ? ?EKG ?None ? ?Radiology ?CT Soft Tissue Neck W Contrast ? ?Result Date: 10/12/2021 ?CLINICAL DATA:  Left-sided neck pain EXAM: CT NECK WITH CONTRAST TECHNIQUE: Multidetector CT imaging of the neck was performed using the standard protocol following the bolus administration of intravenous contrast. RADIATION DOSE REDUCTION: This exam was performed according to the departmental dose-optimization program which includes automated exposure control, adjustment of the mA and/or kV according to patient size and/or use of iterative reconstruction technique. CONTRAST:  75mL OMNIPAQUE IOHEXOL 300 MG/ML  SOLN COMPARISON:  None. FINDINGS: Pharynx and larynx: Normal. No mass or swelling. Salivary glands: No inflammation, mass, or stone. Thyroid: Normal. Lymph nodes: None enlarged or abnormal density. Vascular: Negative. Limited intracranial: Negative. Visualized orbits: Negative. Mastoids and visualized paranasal sinuses: Clear. Skeleton: No acute or aggressive process. Upper chest: Negative. Other: Tract from prior tracheostomy noted. IMPRESSION: Normal CT of the neck. Electronically Signed   By: Deatra RobinsonKevin  Herman M.D.   On: 10/12/2021 23:45   ? ?Procedures ?Procedures  ? ? ?Medications Ordered in  ED ?Medications  ?fentaNYL (SUBLIMAZE) injection 50 mcg (has no administration in time range)  ?methocarbamol (ROBAXIN) tablet 500 mg (500 mg Oral Given 10/12/21 1732)  ?acetaminophen (TYLENOL) tablet 650 mg (650 mg Prudy Feelerra

## 2021-10-12 NOTE — ED Provider Triage Note (Signed)
Emergency Medicine Provider Triage Evaluation Note ? ?Yesenia [name] , a 61 y.o. female  was evaluated in triage.  Pt complains of left-sided neck tenderness, atraumatic, for the past week.  The patient reports she was watching TV when she noticed left-sided neck pain.  Denies any headache or blurry vision.  She reports she has some of the pain down her arm. Denies chest pain or SOB.Marland Kitchen ? ?Review of Systems  ?Positive: See above ?Negative: See above ? ?Physical Exam  ?There were no vitals taken for this visit. ?Gen:   Awake, no distress   ?Resp:  Normal effort  ?MSK:   Moves extremities without difficulty  ?Other:  Palpable muscle spasms on the left into the pec  ? ?Medical Decision Making  ?Medically screening exam initiated at 5:06 PM.  Appropriate orders placed.  SLOAN SNAWDER was informed that the remainder of the evaluation will be completed by another provider, this initial triage assessment does not replace that evaluation, and the importance of remaining in the ED until their evaluation is complete. ? ?BMP ordered for kidney function. Will order Tylenol and Robaxin while here.  ?  ?Sherrell Puller, PA-C ?10/12/21 1707 ? ?

## 2021-10-12 NOTE — Assessment & Plan Note (Addendum)
Differentials include acute torticollis, soft tissue abscess.   On exam patient exhibits extreme tenderness on left side of neck and anteriorly.  No cervical lymphadenopathy appreciated on exam but difficult to exam given pain.   ?Likely MSK but given severe pain, difficulty swallowing and h/o DM, concerned for possible infectious etiology discussed with patient evaluation in ED with CT soft tissue neck. ?Notified ED charge RN and patient transported to ED via wheelchair accompanied by CMA ? ?

## 2021-10-12 NOTE — ED Triage Notes (Signed)
Pt c/o L sided neck & shoulder/arm pain x1.5wks. Associated dizziness, intermittent nausea, "hurts to swallow on that side." No SHOB, CP  ?

## 2021-10-12 NOTE — ED Notes (Signed)
Pt denies injury to neck. Pt states she was sitting and watching tv 1.5 wks ago and the pain in the left side of her neck started all of a sudden. Pt states has numbness and tingling in left arm. Pt has 2+ left radial pulse, cap refill less than 3 sec, warm to touch, 4/5 grip strength bilat. Pt has decreased ROM of neck when turning toward the left side due to pain. Pt has decreased ROM of lifting left arm up due to pain. ?

## 2021-10-13 MED ORDER — CYCLOBENZAPRINE HCL 10 MG PO TABS: 10.0000 mg | ORAL_TABLET | Freq: Two times a day (BID) | ORAL | 0 refills | Status: DC | PRN

## 2021-10-28 ENCOUNTER — Other Ambulatory Visit: Payer: Self-pay | Admitting: Nurse Practitioner

## 2021-12-02 ENCOUNTER — Other Ambulatory Visit: Payer: Self-pay | Admitting: Family Medicine

## 2021-12-05 ENCOUNTER — Other Ambulatory Visit: Payer: Self-pay | Admitting: Family Medicine

## 2021-12-05 DIAGNOSIS — J449 Chronic obstructive pulmonary disease, unspecified: Secondary | ICD-10-CM

## 2021-12-05 DIAGNOSIS — I639 Cerebral infarction, unspecified: Secondary | ICD-10-CM

## 2021-12-05 DIAGNOSIS — E1169 Type 2 diabetes mellitus with other specified complication: Secondary | ICD-10-CM

## 2021-12-23 ENCOUNTER — Other Ambulatory Visit: Payer: Self-pay | Admitting: Family Medicine

## 2022-01-28 ENCOUNTER — Other Ambulatory Visit: Payer: Self-pay | Admitting: Family Medicine

## 2022-02-17 NOTE — Progress Notes (Deleted)
The Pharmacy team is conducting a quality improvement initiative. The recommendation below is for your consideration for this patient who has an appointment with you on 02/17/2022.   This patient with Hypertension and eGFR < 60, may potentially benefit from additional evaluation for proteinuria or medication change.   Current renal protective medications: Jardiance (empagliflozin) 56m daily   Last eGFR: 47  Last UACR: greater than 1 year ago   If appropriate, please consider the additional therapy recommendations below:  - Consider collection of annual UACR  - Consider addition of an ACEi OR ARB

## 2022-02-22 ENCOUNTER — Ambulatory Visit: Payer: Medicaid Other | Admitting: Family Medicine

## 2022-02-24 ENCOUNTER — Other Ambulatory Visit: Payer: Self-pay | Admitting: Family Medicine

## 2022-02-24 DIAGNOSIS — E1142 Type 2 diabetes mellitus with diabetic polyneuropathy: Secondary | ICD-10-CM

## 2022-02-24 DIAGNOSIS — G629 Polyneuropathy, unspecified: Secondary | ICD-10-CM

## 2022-02-24 DIAGNOSIS — N183 Chronic kidney disease, stage 3 unspecified: Secondary | ICD-10-CM

## 2022-03-01 NOTE — Progress Notes (Unsigned)
SUBJECTIVE:   CHIEF COMPLAINT / HPI:   Headache Yesenia [name] presents today with primary complaint of daily headaches.  They have been occurring for about 2 months now.  She reports these started after her brother passed away 2-1/2 months ago.  She has been experiencing worsened depressed mood which has affected her daily functioning.  She believes these headaches are related to emotional stress, as the same thing happened to her after her mother passed. - Location: Right-sided temporal - Quality: Pounding - Timing: 5 to 6 days/week - Duration: 15 to 20 minutes each - Associated symptoms: Some photophobia, increased nausea, worsened blurry vision - Denies: Facial droop, focal neurological deficit, slurring during headache, vision field loss, scotomas, photophobia, presyncope/syncope, jaw claudication -Previous head imaging: CT head without contrast 08/21/2019 showed chronic ischemic changes consistent with small vessel disease, no tumors seen, no midline shift MRI brain 08/08/2018 shows "moderately advanced cerebral white matter disease" related to chronic microvascular ischemic disease, no tumors or mass effects  BP Readings from Last 3 Encounters:  03/03/22 138/84  10/12/21 125/66  10/12/21 101/76    T2DM Due for A1c check today.  She reports doing well with her diet, she has switched to low sugar or sugar-free items.  Tolerating medications well.  No concerns.  Currently prescribed Jardiance 10 mg daily. Lab Results  Component Value Date   HGBA1C 6.3 03/03/2022   HGBA1C 7.6 (A) 08/12/2021   HGBA1C 7.0 01/19/2021   Lab Results  Component Value Date   LDLCALC NOT CALCULATED 04/30/2020   CREATININE 1.30 (H) 10/12/2021   PERTINENT  PMH / PSH: HTN, T2DM, hypothyroid/Graves' disease, HLD, hepatic steatosis, COPD, CVA, CKD 3, BMI 49, depression, anxiety, fibromyalgia  OBJECTIVE:   BP 138/84   Pulse (!) 112   Wt (!) 304 lb 12.8 oz (138.3 kg)   SpO2 98%   BMI 49.20 kg/m     PHQ-9:     03/03/2022    8:55 AM 10/12/2021    3:41 PM 08/12/2021    3:50 PM  Depression screen PHQ 2/9  Decreased Interest 2 1 2   Down, Depressed, Hopeless 2 0 0  PHQ - 2 Score 4 1 2   Altered sleeping 2 1 1   Tired, decreased energy 1 1 3   Change in appetite 1 1 2   Feeling bad or failure about yourself  1 0 0  Trouble concentrating 1 1 2   Moving slowly or fidgety/restless 1 2 0  Suicidal thoughts 1 0 0  PHQ-9 Score 12 7 10    Vision Screening   Right eye Left eye Both eyes  Without correction 20/70 20/70 2030  With correction      Physical Exam General: Awake, alert, oriented, intermittently tearful Respiratory: Speaking in full sentences, no respiratory distress HEENT: EOM intact, sclera anicteric, TMs unremarkable and pearly pink bilaterally, peripheral vision intact, vision screening above (noted patient wears glasses at baseline does not have them today), no TTP over right temple/cheek/TMJ, no periorbital swelling Skin: No facial rashes or lesions  Neuro: All testing 5/5 and equal bilaterally - forced eye closure, cheek puff, tongue protrusion and wag, shoulder raise against resistance  ASSESSMENT/PLAN:   Nonintractable episodic headache Likely related to emotional stress given temporal relation to brother's passing and previous experience of the same presentation after her mother passed.  Given chronic episodic nature with short episodes that spontaneously resolve, unlikely to be GCA.  Previous brain imaging in 2020 and 2021 without tumor or other mass effect, no focal neurological  symptoms to suggest need for rescan.  No vision field deficits or asymmetric vision screening results to suggest primary ophthalmic etiology.  Diabetes is well controlled.  Will recommend trial of naproxen twice daily x7 days.  Clinic return and ED precautions given.  See AVS for more.  Positive screening for depression on 9-item Patient Health Questionnaire (PHQ-9) PHQ-9 score worsened from  previous, question #9 positive.  Discussed with patient, denies active SI, says it is more fleeting passive SI.  No plans to hurt herself, no method chosen, no rehearsals.  Reports strong protective factors of her grandchildren and her new cat that her family got her to help with mood after her brother's passing.  No indication for urgent intervention at this time.  Provided with behavioral health urgent care and [phone] helpline resources.  Type 2 diabetes mellitus with peripheral neuropathy (HCC) A1c today 6.3, improved from 7.6 in February.  Likely due to reduction in sugar intake.  No changes to regimen at this time.     Fayette Pho, MD [facility]

## 2022-03-03 ENCOUNTER — Ambulatory Visit (INDEPENDENT_AMBULATORY_CARE_PROVIDER_SITE_OTHER): Payer: Medicaid Other | Admitting: Family Medicine

## 2022-03-03 ENCOUNTER — Encounter: Payer: Self-pay | Admitting: Family Medicine

## 2022-03-03 VITALS — BP 138/84 | HR 112 | Wt 304.8 lb

## 2022-03-03 DIAGNOSIS — E1142 Type 2 diabetes mellitus with diabetic polyneuropathy: Secondary | ICD-10-CM

## 2022-03-03 DIAGNOSIS — R519 Headache, unspecified: Secondary | ICD-10-CM | POA: Insufficient documentation

## 2022-03-03 DIAGNOSIS — Z1331 Encounter for screening for depression: Secondary | ICD-10-CM | POA: Diagnosis not present

## 2022-03-03 DIAGNOSIS — Z7689 Persons encountering health services in other specified circumstances: Secondary | ICD-10-CM | POA: Diagnosis not present

## 2022-03-03 LAB — POCT GLYCOSYLATED HEMOGLOBIN (HGB A1C): HbA1c, POC (controlled diabetic range): 6.3 % (ref 0.0–7.0)

## 2022-03-03 NOTE — Patient Instructions (Addendum)
It was wonderful to see you today. Thank you for allowing me to be a part of your care. Below is a short summary of what we discussed at your visit today:  Headaches Based on your symptoms and your exam today, I am not concerned for any "big bad" causes of your headache.  I do not have reason to believe it is a mini stroke, brain tumor, or an inflammatory condition of your blood vessels.  Given the temporal relation to your brother's passing, this is most likely an emotionally driven headache.  Try scheduled naproxen twice daily for 7 days in a row to try and reduce the number of headaches you get.  Do not take for more than 7 days in a row.  For the vision deficit, please go see an eye doctor of your choice as soon as possible.  You may need prescription glasses.  Reasons to return to the clinic: -If the naproxen does not work - If it becomes more frequent - If it lasts longer  Reasons to go to the emergency room: -If part of your vision goes black -If you develop facial droop or difficulty speaking with a headache -If you develop difficulty using an arm or leg  Emotional support animal I have written you a letter to your landlord to cover your cat as an emotional support animal.  Please call for any questions or concerns.  Personal care services The way we go about this is you will need to call anyone to the personal care services in town.  You give them my information and they will send the appropriate forms to my office.  I will fill them out and fax them back.     [facility] Available as walk-in brain health care 24/7 [phone] [address] [place], [place] [address]  [email or web address]       Please bring all of your medications to every appointment!  If you have any questions or concerns, please do not hesitate to contact us via phone or MyChart message.   Fayette Pho,  MD

## 2022-03-03 NOTE — Assessment & Plan Note (Signed)
PHQ-9 score worsened from previous, question #9 positive.  Discussed with patient, denies active SI, says it is more fleeting passive SI.  No plans to hurt herself, no method chosen, no rehearsals.  Reports strong protective factors of her grandchildren and her new cat that her family got her to help with mood after her brother's passing.  No indication for urgent intervention at this time.  Provided with behavioral health urgent care and 988 helpline resources.

## 2022-03-03 NOTE — Assessment & Plan Note (Signed)
Likely related to emotional stress given temporal relation to brother's passing and previous experience of the same presentation after her mother passed.  Given chronic episodic nature with short episodes that spontaneously resolve, unlikely to be GCA.  Previous brain imaging in 2020 and 2021 without tumor or other mass effect, no focal neurological symptoms to suggest need for rescan.  No vision field deficits or asymmetric vision screening results to suggest primary ophthalmic etiology.  Diabetes is well controlled.  Will recommend trial of naproxen twice daily x7 days.  Clinic return and ED precautions given.  See AVS for more.

## 2022-03-03 NOTE — Assessment & Plan Note (Signed)
A1c today 6.3, improved from 7.6 in February.  Likely due to reduction in sugar intake.  No changes to regimen at this time.

## 2022-04-21 ENCOUNTER — Other Ambulatory Visit: Payer: Self-pay | Admitting: Family Medicine

## 2022-04-22 ENCOUNTER — Other Ambulatory Visit: Payer: Self-pay | Admitting: Family Medicine

## 2022-04-28 ENCOUNTER — Other Ambulatory Visit: Payer: Self-pay | Admitting: Family Medicine

## 2022-04-30 ENCOUNTER — Other Ambulatory Visit: Payer: Self-pay | Admitting: Family Medicine

## 2022-05-31 ENCOUNTER — Other Ambulatory Visit (HOSPITAL_COMMUNITY): Payer: Self-pay

## 2022-05-31 ENCOUNTER — Telehealth: Payer: Self-pay

## 2022-05-31 NOTE — Telephone Encounter (Signed)
A Prior Authorization was initiated for this patients JARDIANCE 10MG  through [facility] [employer]  [facility]  PENDING PA# [id number]

## 2022-06-07 ENCOUNTER — Other Ambulatory Visit (HOSPITAL_COMMUNITY): Payer: Self-pay

## 2022-06-07 NOTE — Telephone Encounter (Signed)
Patient calls nurse line in regards to [place].  She reports she received a denial letter over the weekend from [place].   Will forward to [place] to confirm.

## 2022-06-07 NOTE — Telephone Encounter (Signed)
Prior Auth for patients medication JARDIANCE denied by MEDICAID via [employer].   Reason: None given, however per medicaid coverage: For use in type 2 diabetes mellitus, requires trial and failure or insufficient response to metformin containing products (except for beneficiaries with ASCVD, heart failure, or CKD) unless contraindicated or documented adverse event when using either a preferred or a non-preferred SGLT2 Inhibitor and Combination.  When the primary indication is heart failure, no trial and failure of metformin-containing products  is required.  Confirmation #[id number] P

## 2022-06-09 ENCOUNTER — Other Ambulatory Visit (HOSPITAL_COMMUNITY): Payer: Self-pay

## 2022-06-09 NOTE — Telephone Encounter (Signed)
Resubmitted PA via [employer] with information on trial & failure of metformin.  Confirmation #[id number]

## 2022-06-11 ENCOUNTER — Other Ambulatory Visit (HOSPITAL_COMMUNITY): Payer: Self-pay

## 2022-06-11 NOTE — Telephone Encounter (Signed)
Prior Auth for patients medication JARDIANCE approved by MEDICAID from 06/09/22 to 06/09/23.

## 2022-07-09 ENCOUNTER — Other Ambulatory Visit: Payer: Self-pay | Admitting: Family Medicine

## 2022-07-10 ENCOUNTER — Other Ambulatory Visit: Payer: Self-pay | Admitting: Family Medicine

## 2022-07-23 ENCOUNTER — Other Ambulatory Visit: Payer: Self-pay | Admitting: Nurse Practitioner

## 2022-08-02 ENCOUNTER — Other Ambulatory Visit: Payer: Self-pay | Admitting: Family Medicine

## 2022-09-16 ENCOUNTER — Other Ambulatory Visit: Payer: Self-pay | Admitting: Family Medicine

## 2022-10-11 ENCOUNTER — Ambulatory Visit: Payer: Medicaid Other | Admitting: Family Medicine

## 2022-10-18 ENCOUNTER — Other Ambulatory Visit: Payer: Self-pay | Admitting: Family Medicine

## 2022-11-05 ENCOUNTER — Telehealth: Payer: Self-pay | Admitting: Nurse Practitioner

## 2022-11-05 MED ORDER — CHOLESTYRAMINE 4 G PO PACK: PACK | ORAL | 0 refills | Status: DC

## 2022-11-05 NOTE — Telephone Encounter (Signed)
PT needs a refill on cholestyramine. It should be sent to [facility]. Please advise.

## 2022-11-05 NOTE — Telephone Encounter (Signed)
Pt called. Pt verbalized understanding that refills were sent & to keep appointments for future refills.

## 2022-11-05 NOTE — Telephone Encounter (Signed)
Colleen, please advise. Patient has not been seen since 07/29/21.

## 2022-11-30 ENCOUNTER — Other Ambulatory Visit: Payer: Self-pay | Admitting: Family Medicine

## 2022-11-30 DIAGNOSIS — J449 Chronic obstructive pulmonary disease, unspecified: Secondary | ICD-10-CM

## 2022-11-30 DIAGNOSIS — E1169 Type 2 diabetes mellitus with other specified complication: Secondary | ICD-10-CM

## 2022-11-30 DIAGNOSIS — I639 Cerebral infarction, unspecified: Secondary | ICD-10-CM

## 2022-12-20 ENCOUNTER — Other Ambulatory Visit: Payer: Self-pay | Admitting: Family Medicine

## 2022-12-22 ENCOUNTER — Other Ambulatory Visit: Payer: Self-pay | Admitting: Family Medicine

## 2023-01-06 ENCOUNTER — Telehealth: Payer: Self-pay | Admitting: Nurse Practitioner

## 2023-01-06 NOTE — Telephone Encounter (Signed)
Patient is calling states her new insurance, [employer] [phone], called her and said she has to do her transportation through them but that starts on 7/1 which is when her next appointment is. Her old insurance is no longer providing her transportation for her appointment so now she is left with none. Patient is anxious if she will not make it to her appointment she will no longer have access to her medication. Please advise

## 2023-01-07 NOTE — Telephone Encounter (Signed)
Patient is calling back to f/u regarding message below.

## 2023-01-07 NOTE — Telephone Encounter (Signed)
Spoke with patient & she said her new insurance starts on 01/10/23 and she was told that they would be unable to book her transportation any sooner, given that she isn't in their system. Her daughter's car is broken down & unable to call anyone else for a ride. Advised she call insurance first thing Monday morning to see if transportation is available for her appointment, and if not to call us right after to reschedule appointment. If needed, refills can be discussed at that time with provider. Pt verbalized all understanding.

## 2023-01-10 ENCOUNTER — Ambulatory Visit: Payer: Medicaid Other | Admitting: Nurse Practitioner

## 2023-01-10 ENCOUNTER — Other Ambulatory Visit: Payer: Self-pay

## 2023-01-10 DIAGNOSIS — R197 Diarrhea, unspecified: Secondary | ICD-10-CM

## 2023-01-10 MED ORDER — CHOLESTYRAMINE 4 G PO PACK: 4.0000 g | PACK | Freq: Every day | ORAL | 0 refills | Status: DC

## 2023-01-10 NOTE — Telephone Encounter (Signed)
Patient called back.  She is waiting on her refill.  She says she cannot eat anything until she gets it.  Please advise.  Thank you.,

## 2023-01-10 NOTE — Telephone Encounter (Signed)
Yesenia [name] ok to refill cholestyramine 4 g packet mixed in 4 ounces of clear liquid daily not to be taken within 4 hours of any other medication #30 no refills.  Thank you

## 2023-01-10 NOTE — Telephone Encounter (Signed)
Pt stated that she needed to cancel her appointment for today due to transportation issues. Pt was rescheduled prior . Pt scheduled to see Willette Cluster NP on 03/22/2023 at 3:00 PM.   Pt is requesting a medication refill for her Cholestyramine. Please advise on refill.

## 2023-01-10 NOTE — Telephone Encounter (Signed)
Prescription was sent to pharmacy. Pt was made aware.  Pt verbalized understanding with all questions answered.

## 2023-01-10 NOTE — Telephone Encounter (Signed)
Inbound call from patient stating that the transportation service that she was planning to use for today's appointment 7/1 requires a 2 day notice. She is rescheduled for 9/10. Requesting a call back regarding medication refill. Please advise, thank you.

## 2023-02-07 ENCOUNTER — Telehealth: Payer: Self-pay | Admitting: Nurse Practitioner

## 2023-02-07 MED ORDER — CHOLESTYRAMINE 4 G PO PACK: PACK | ORAL | 0 refills | Status: DC

## 2023-02-07 NOTE — Telephone Encounter (Signed)
Inbound call from patient requesting a refill for cholestyramine  she is schedule to f/u 9/10 .Please advise

## 2023-02-07 NOTE — Telephone Encounter (Signed)
Cholestyramine sent to pharmacy  

## 2023-02-08 ENCOUNTER — Ambulatory Visit: Payer: Self-pay | Admitting: *Deleted

## 2023-02-08 NOTE — Telephone Encounter (Signed)
Pt is calling ing because per pt for the last couple of weeks pt has had side pain. Pt says it goes to middle of her stomach and under breast. It hurts when she walks or moves and she has no appetite. Pt says she couldn't stand up yesterday, but has no other symptoms    Chief Complaint: Abdominal pain Symptoms: Pain right sided, radiates to left and under breasts, Worse with movement. No appetite. 9.5/10 Frequency: 2 weeks, worsening Pertinent Negatives: Patient denies diarrhea, constipation,dysuria, fever Disposition: [x] ED /[] Urgent Care (no appt availability in office) / [] Appointment(In office/virtual)/ []  [facility]/ [] Home Care/ [] Refused Recommended Disposition /[] [facility]/ []  Follow-up with PCP Additional Notes: Rates pain at 9.5/10. Pt yells out during call when moving. Advised ED, states will follow disposition. CAre advise provided, verbalizes understanding. [employer] call Reason for Disposition  [1] SEVERE pain (e.g., excruciating) AND [2] present > 1 hour  Answer Assessment - Initial Assessment Questions 1. LOCATION: "Where does it hurt?"      Right and radiates to left 2. RADIATION: "Does the pain shoot anywhere else?" (e.g., chest, back)     To left 3. ONSET: "When did the pain begin?" (e.g., minutes, hours or days ago)      2 weeks ago 4. SUDDEN: "Gradual or sudden onset?"     Gradual 5. PATTERN "Does the pain come and go, or is it constant?"    - If it comes and goes: "How long does it last?" "Do you have pain now?"     (Note: Comes and goes means the pain is intermittent. It goes away completely between bouts.)    - If constant: "Is it getting better, staying the same, or getting worse?"      (Note: Constant means the pain never goes away completely; most serious pain is constant and gets worse.)      Occurs with walking or movement 6. SEVERITY: "How bad is the pain?"  (e.g., Scale 1-10; mild, moderate, or severe)    - MILD (1-3): Doesn't  interfere with normal activities, abdomen soft and not tender to touch.     - MODERATE (4-7): Interferes with normal activities or awakens from sleep, abdomen tender to touch.     - SEVERE (8-10): Excruciating pain, doubled over, unable to do any normal activities.       9.5/10 7. RECURRENT SYMPTOM: "Have you ever had this type of stomach pain before?" If Yes, ask: "When was the last time?" and "What happened that time?"      no 8. CAUSE: "What do you think is causing the stomach pain?"     unsure 9. RELIEVING/AGGRAVATING FACTORS: "What makes it better or worse?" (e.g., antacids, bending or twisting motion, bowel movement)     Movement 10. OTHER SYMPTOMS: "Do you have any other symptoms?" (e.g., back pain, diarrhea, fever, urination pain, vomiting)       No appetite  Protocols used: Abdominal Pain - Female-A-AH

## 2023-02-24 ENCOUNTER — Other Ambulatory Visit: Payer: Self-pay | Admitting: Family Medicine

## 2023-03-01 ENCOUNTER — Other Ambulatory Visit: Payer: Self-pay | Admitting: Family Medicine

## 2023-03-01 DIAGNOSIS — N183 Chronic kidney disease, stage 3 unspecified: Secondary | ICD-10-CM

## 2023-03-01 DIAGNOSIS — G629 Polyneuropathy, unspecified: Secondary | ICD-10-CM

## 2023-03-01 DIAGNOSIS — E1142 Type 2 diabetes mellitus with diabetic polyneuropathy: Secondary | ICD-10-CM

## 2023-03-08 ENCOUNTER — Telehealth: Payer: Self-pay | Admitting: Nurse Practitioner

## 2023-03-08 DIAGNOSIS — R197 Diarrhea, unspecified: Secondary | ICD-10-CM

## 2023-03-08 MED ORDER — CHOLESTYRAMINE 4 G PO PACK
4.0000 g | PACK | Freq: Every day | ORAL | 0 refills | Status: DC
Start: 2023-03-08 — End: 2023-04-07

## 2023-03-08 NOTE — Telephone Encounter (Signed)
PT is calling to get a refill on Questran. She only has 3 left and she has scheduled an OV. Send script to [facility]

## 2023-03-08 NOTE — Telephone Encounter (Signed)
Questran was sent to [facility] pharmacy. Contacted pt & LVM.

## 2023-03-22 ENCOUNTER — Telehealth: Payer: Self-pay | Admitting: Nurse Practitioner

## 2023-03-22 ENCOUNTER — Encounter: Payer: Self-pay | Admitting: Nurse Practitioner

## 2023-03-22 ENCOUNTER — Ambulatory Visit: Payer: Medicaid Other | Admitting: Nurse Practitioner

## 2023-03-22 VITALS — BP 118/78 | HR 102 | Ht 66.0 in | Wt 263.0 lb

## 2023-03-22 DIAGNOSIS — Z86010 Personal history of colonic polyps: Secondary | ICD-10-CM | POA: Diagnosis not present

## 2023-03-22 DIAGNOSIS — R197 Diarrhea, unspecified: Secondary | ICD-10-CM

## 2023-03-22 DIAGNOSIS — R634 Abnormal weight loss: Secondary | ICD-10-CM

## 2023-03-22 DIAGNOSIS — R112 Nausea with vomiting, unspecified: Secondary | ICD-10-CM

## 2023-03-22 NOTE — Telephone Encounter (Signed)
Inbound call from patient stating that she is not able to keep appointment for her EGD on 10/15 at 4:00.    Patient was rescheduled for 10/17 at 3:00 and will need updated instructions.

## 2023-03-22 NOTE — Patient Instructions (Signed)
Please follow up with PCP as soon as possible for weight loss  You have been scheduled for an endoscopy. Please follow written instructions given to you at your visit today.  If you use inhalers (even only as needed), please bring them with you on the day of your procedure.  If you take any of the following medications, they will need to be adjusted prior to your procedure:   DO NOT TAKE 7 DAYS PRIOR TO TEST- Trulicity (dulaglutide) Ozempic, Wegovy (semaglutide) Mounjaro (tirzepatide) Bydureon Bcise (exanatide extended release)  DO NOT TAKE 1 DAY PRIOR TO YOUR TEST Rybelsus (semaglutide) Adlyxin (lixisenatide) Victoza (liraglutide) Byetta (exanatide) ___________________________________________________________________________   We have sent the following medications to your pharmacy for you to pick up at your convenience:   _______________________________________________________  If your blood pressure at your visit was 140/90 or greater, please contact your primary care physician to follow up on this.  _______________________________________________________  If you are age 50 or older, your body mass index should be between 23-30. Your Body mass index is 42.45 kg/m. If this is out of the aforementioned range listed, please consider follow up with your Primary Care Provider.  If you are age 55 or younger, your body mass index should be between 19-25. Your Body mass index is 42.45 kg/m. If this is out of the aformentioned range listed, please consider follow up with your Primary Care Provider.   ________________________________________________________  The [facility] [place] [facility] providers would like to encourage you to use [facility] to communicate with providers for non-urgent requests or questions.  Due to long hold times on the telephone, sending your provider a message by [facility] may be a faster and more efficient way to get a response.  Please allow 48 business hours for a response.   Please remember that this is for non-urgent requests.  _______________________________________________________ It was a pleasure to see you today!  Thank you for trusting me with your gastrointestinal care!
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
## 2023-03-23 NOTE — Progress Notes (Signed)
Agree with assessment and plan as outlined.   She was previously done at the hospital as her BMI was > 50. Her BMI is now in the low 40s and I think okay to do her case in the [facility], given her weight loss. Can start with EGD. Once her upper tract symptoms are controlled and she can tolerate a prep, can then coordinate colonoscopy. Agree with pepcid, can add PPI if she isn't already taking that. If EGD negative and she continues to lose weight unintentionally, consider CT scan abdomen / pelvis.

## 2023-03-25 NOTE — Telephone Encounter (Signed)
Contacted pt & pt is aware of intructions being sent via mail & pt verified address.

## 2023-04-04 ENCOUNTER — Other Ambulatory Visit: Payer: Self-pay | Admitting: Family Medicine

## 2023-04-04 DIAGNOSIS — J449 Chronic obstructive pulmonary disease, unspecified: Secondary | ICD-10-CM

## 2023-04-07 ENCOUNTER — Telehealth: Payer: Self-pay | Admitting: Nurse Practitioner

## 2023-04-07 ENCOUNTER — Other Ambulatory Visit: Payer: Self-pay

## 2023-04-07 DIAGNOSIS — R197 Diarrhea, unspecified: Secondary | ICD-10-CM

## 2023-04-07 MED ORDER — CHOLESTYRAMINE 4 G PO PACK: 4.0000 g | PACK | Freq: Every day | ORAL | 0 refills | Status: DC

## 2023-04-07 NOTE — Telephone Encounter (Signed)
PT is calling to have a refill on cholestyramine. She needs it before the weekend so she will be able to eat. It should be sent to [facility].

## 2023-04-07 NOTE — Telephone Encounter (Signed)
Cholestyramine sent to [facility]. Pt was notified.

## 2023-04-26 ENCOUNTER — Encounter: Payer: Medicaid Other | Admitting: Gastroenterology

## 2023-04-28 ENCOUNTER — Ambulatory Visit: Payer: Medicaid Other | Admitting: Gastroenterology

## 2023-04-28 ENCOUNTER — Encounter: Payer: Self-pay | Admitting: Gastroenterology

## 2023-04-28 VITALS — BP 136/84 | HR 87 | Temp 98.7°F | Resp 17 | Ht 66.0 in | Wt 263.0 lb

## 2023-04-28 DIAGNOSIS — N183 Chronic kidney disease, stage 3 unspecified: Secondary | ICD-10-CM | POA: Diagnosis not present

## 2023-04-28 DIAGNOSIS — R112 Nausea with vomiting, unspecified: Secondary | ICD-10-CM

## 2023-04-28 DIAGNOSIS — M797 Fibromyalgia: Secondary | ICD-10-CM | POA: Diagnosis not present

## 2023-04-28 DIAGNOSIS — G4733 Obstructive sleep apnea (adult) (pediatric): Secondary | ICD-10-CM | POA: Diagnosis not present

## 2023-04-28 DIAGNOSIS — K2970 Gastritis, unspecified, without bleeding: Secondary | ICD-10-CM | POA: Diagnosis not present

## 2023-04-28 DIAGNOSIS — R634 Abnormal weight loss: Secondary | ICD-10-CM

## 2023-04-28 DIAGNOSIS — K319 Disease of stomach and duodenum, unspecified: Secondary | ICD-10-CM | POA: Diagnosis not present

## 2023-04-28 DIAGNOSIS — I10 Essential (primary) hypertension: Secondary | ICD-10-CM | POA: Diagnosis not present

## 2023-04-28 DIAGNOSIS — F32A Depression, unspecified: Secondary | ICD-10-CM | POA: Diagnosis not present

## 2023-04-28 DIAGNOSIS — E119 Type 2 diabetes mellitus without complications: Secondary | ICD-10-CM | POA: Diagnosis not present

## 2023-04-28 MED ORDER — OMEPRAZOLE 20 MG PO CPDR: 20.0000 mg | DELAYED_RELEASE_CAPSULE | Freq: Every day | ORAL | 1 refills | Status: DC

## 2023-04-28 MED ORDER — SODIUM CHLORIDE 0.9 % IV SOLN: 500.0000 mL | Freq: Once | INTRAVENOUS | Status: AC

## 2023-04-28 MED ORDER — ONDANSETRON 4 MG PO TBDP: 4.0000 mg | ORAL_TABLET | Freq: Three times a day (TID) | ORAL | 1 refills | Status: DC | PRN

## 2023-04-28 NOTE — Op Note (Signed)
[facility] Patient Name: Yesenia [name] Procedure Date: 04/28/2023 3:31 PM MRN: [id number] Endoscopist: Viviann Spare P. Adela Lank , MD, [id number] Age: 62 Referring MD:  Date of Birth: Dec 02, 1960 Gender: Female Account #: [id number] Procedure:                Upper GI endoscopy Indications:              Nausea, decreased appetite, unexpected weight loss Medicines:                Monitored Anesthesia Care Procedure:                Pre-Anesthesia Assessment:                           - Prior to the procedure, a History and Physical                            was performed, and patient medications and                            allergies were reviewed. The patient's tolerance of                            previous anesthesia was also reviewed. The risks                            and benefits of the procedure and the sedation                            options and risks were discussed with the patient.                            All questions were answered, and informed consent                            was obtained. Prior Anticoagulants: The patient has                            taken no anticoagulant or antiplatelet agents. ASA                            Grade Assessment: III - A patient with severe                            systemic disease. After reviewing the risks and                            benefits, the patient was deemed in satisfactory                            condition to undergo the procedure.                           After obtaining informed consent, the endoscope was  passed under direct vision. Throughout the                            procedure, the patient's blood pressure, pulse, and                            oxygen saturations were monitored continuously. The                            GIF [id number] #[id number] was introduced through the                            mouth, and advanced to the second part of duodenum.                             The upper GI endoscopy was accomplished without                            difficulty. The patient tolerated the procedure                            well. Scope In: Scope Out: Findings:                 Esophagogastric landmarks were identified: the                            Z-line was found at 37 cm, the gastroesophageal                            junction was found at 37 cm and the upper extent of                            the gastric folds was found at 38 cm from the                            incisors.                           A 1 cm hiatal hernia was present.                           The exam of the esophagus was otherwise normal.                           Patchy erythematous mucosa was found in the gastric                            antrum.                           The exam of the stomach was otherwise normal.                           Biopsies were taken with a cold forceps for  Helicobacter pylori testing.                           The examined duodenum was normal. Complications:            No immediate complications. Estimated blood loss:                            Minimal. Estimated Blood Loss:     Estimated blood loss was minimal. Impression:               - Esophagogastric landmarks identified.                           - 1 cm hiatal hernia.                           - Erythematous mucosa in the antrum.                           - Normal stomach otherwise - biopsies taken to rule                            out H pylori                           - Normal examined duodenum.                           No overt pathology noted to cause symptoms. Recommendation:           - Patient has a contact number available for                            emergencies. The signs and symptoms of potential                            delayed complications were discussed with the                            patient. Return to normal activities tomorrow.                             Written discharge instructions were provided to the                            patient.                           - Resume previous diet.                           - Continue present medications.                           - Take Zofran as recommended for nausea                           -  Trial of omeprazole 20mg  / daily                           - Await pathology results.                           - Consideration for cross sectional imaging if                            symptoms persist / progress despite measures taken Viviann Spare P. Cormick Moss, MD 04/28/2023 3:51:58 PM This report has been signed electronically.
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
## 2023-04-28 NOTE — Progress Notes (Signed)
Called to room to assist during endoscopic procedure.  Patient ID and intended procedure confirmed with present staff. Received instructions for my participation in the procedure from the performing physician.  

## 2023-04-28 NOTE — Patient Instructions (Signed)
YOU HAD AN ENDOSCOPIC PROCEDURE TODAY AT THE [facility]:   Refer to the procedure report that was given to you for any specific questions about what was found during the examination.  If the procedure report does not answer your questions, please call your gastroenterologist to clarify.  If you requested that your care partner not be given the details of your procedure findings, then the procedure report has been included in a sealed envelope for you to review at your convenience later.  YOU SHOULD EXPECT: Some feelings of bloating in the abdomen. Passage of more gas than usual.  Walking can help get rid of the air that was put into your GI tract during the procedure and reduce the bloating. If you had a lower endoscopy (such as a colonoscopy or flexible sigmoidoscopy) you may notice spotting of blood in your stool or on the toilet paper. If you underwent a bowel prep for your procedure, you may not have a normal bowel movement for a few days.  Please Note:  You might notice some irritation and congestion in your nose or some drainage.  This is from the oxygen used during your procedure.  There is no need for concern and it should clear up in a day or so.  SYMPTOMS TO REPORT IMMEDIATELY:   Following upper endoscopy (EGD)  Vomiting of blood or coffee ground material  New chest pain or pain under the shoulder blades  Painful or persistently difficult swallowing  New shortness of breath  Fever of 100F or higher  Black, tarry-looking stools  For urgent or emergent issues, a gastroenterologist can be reached at any hour by calling [phone]. Do not use MyChart messaging for urgent concerns.    DIET:  We do recommend a small meal at first, but then you may proceed to your regular diet.  Drink plenty of fluids but you should avoid alcoholic beverages for 24 hours.  MEDICATIONS: Continue present medications. Trial of Omeprazole 20 mg by mouth daily. Take Zofran 4 mg OCT by mouth 8  hours as needed for nausea.  Please see  handouts given to you by your recovery nurse: Hiatal Hernia.  FOLLOW UP:  Await pathology results . Consideration for cross sectional imaging if symptoms persist/progress despite measure taken.   ACTIVITY:  You should plan to take it easy for the rest of today and you should NOT DRIVE or use heavy machinery until tomorrow (because of the sedation medicines used during the test).    FOLLOW UP: Our staff will call the number listed on your records the next business day following your procedure.  We will call around 7:15- 8:00 am to check on you and address any questions or concerns that you may have regarding the information given to you following your procedure. If we do not reach you, we will leave a message.     If any biopsies were taken you will be contacted by phone or by letter within the next 1-3 weeks.  Please call us at [phone] if you have not heard about the biopsies in 3 weeks.    SIGNATURES/CONFIDENTIALITY: You and/or your care partner have signed paperwork which will be entered into your electronic medical record.  These signatures attest to the fact that that the information above on your After Visit Summary has been reviewed and is understood.  Full responsibility of the confidentiality of this discharge information lies with you and/or your care-partner.

## 2023-04-29 ENCOUNTER — Telehealth: Payer: Self-pay

## 2023-04-29 NOTE — Telephone Encounter (Signed)
  Follow up Call-     04/28/2023    2:19 PM  Call back number  Post procedure Call Back phone  # [phone]  Permission to leave phone message Yes     Patient questions:  Do you have a fever, pain , or abdominal swelling? No. Pain Score  0 *  Have you tolerated food without any problems? Yes.    Have you been able to return to your normal activities? Yes.    Do you have any questions about your discharge instructions: Diet   No. Medications  No. Follow up visit  No.  Do you have questions or concerns about your Care? No.  Actions: * If pain score is 4 or above: No action needed, pain <4.

## 2023-05-03 LAB — SURGICAL PATHOLOGY

## 2023-05-10 ENCOUNTER — Other Ambulatory Visit: Payer: Self-pay | Admitting: Gastroenterology

## 2023-05-10 ENCOUNTER — Telehealth: Payer: Self-pay | Admitting: Gastroenterology

## 2023-05-10 DIAGNOSIS — R197 Diarrhea, unspecified: Secondary | ICD-10-CM

## 2023-05-10 MED ORDER — CHOLESTYRAMINE 4 G PO PACK: 4.0000 g | PACK | Freq: Every day | ORAL | 2 refills | Status: DC

## 2023-05-10 NOTE — Telephone Encounter (Signed)
Patient is requesting refill of Cholestyramine. Refill sent

## 2023-05-10 NOTE — Telephone Encounter (Signed)
Inbound call from patient stating a powder prescription was suppose to be called into her pharmacy. States she spoke with pharmacy and they have not received anything. Patient requesting a call. Please advise, thank you.

## 2023-07-12 ENCOUNTER — Other Ambulatory Visit: Payer: Self-pay | Admitting: Family Medicine

## 2023-07-12 ENCOUNTER — Other Ambulatory Visit: Payer: Self-pay | Admitting: Student

## 2023-07-25 ENCOUNTER — Other Ambulatory Visit: Payer: Self-pay | Admitting: Gastroenterology

## 2023-07-25 ENCOUNTER — Other Ambulatory Visit: Payer: Self-pay | Admitting: Nurse Practitioner

## 2023-07-25 DIAGNOSIS — R197 Diarrhea, unspecified: Secondary | ICD-10-CM

## 2023-09-07 ENCOUNTER — Other Ambulatory Visit: Payer: Self-pay | Admitting: Student

## 2023-09-07 DIAGNOSIS — J449 Chronic obstructive pulmonary disease, unspecified: Secondary | ICD-10-CM

## 2023-09-08 ENCOUNTER — Other Ambulatory Visit: Payer: Self-pay

## 2023-09-08 ENCOUNTER — Telehealth: Payer: Self-pay

## 2023-09-08 MED ORDER — HYDROXYZINE HCL 25 MG PO TABS
25.0000 mg | ORAL_TABLET | Freq: Three times a day (TID) | ORAL | 3 refills | Status: DC | PRN
Start: 2023-09-08 — End: 2024-02-20

## 2023-09-08 NOTE — Telephone Encounter (Signed)
 Pharmacy Patient Advocate Encounter   Received notification from CoverMyMeds that prior authorization for JARDIANCE is required/requested.    The patient is insured through [facility] .   PA required; PA submitted to above mentioned insurance via CoverMyMeds Key/confirmation #/EOC [id number]. Status is pending

## 2023-09-09 NOTE — Telephone Encounter (Signed)
 Pharmacy Patient Advocate Encounter  Received notification from [facility] that Prior Authorization for [facility] has been DENIED.  Full denial letter will be uploaded to the media tab. See denial reason below.    Patient will need appointment.   PA #/Case ID/Reference #: [id number]

## 2023-10-04 ENCOUNTER — Other Ambulatory Visit: Payer: Self-pay | Admitting: Nurse Practitioner

## 2023-10-04 DIAGNOSIS — R197 Diarrhea, unspecified: Secondary | ICD-10-CM

## 2023-10-17 ENCOUNTER — Ambulatory Visit: Admitting: Student

## 2023-11-02 ENCOUNTER — Other Ambulatory Visit: Payer: Self-pay | Admitting: Family Medicine

## 2023-11-17 ENCOUNTER — Encounter: Payer: Self-pay | Admitting: Family Medicine

## 2023-11-17 ENCOUNTER — Ambulatory Visit: Admitting: Family Medicine

## 2023-11-17 VITALS — BP 143/95 | HR 84 | Ht 66.0 in | Wt 265.6 lb

## 2023-11-17 DIAGNOSIS — N183 Chronic kidney disease, stage 3 unspecified: Secondary | ICD-10-CM | POA: Diagnosis not present

## 2023-11-17 DIAGNOSIS — Z23 Encounter for immunization: Secondary | ICD-10-CM | POA: Diagnosis present

## 2023-11-17 DIAGNOSIS — M25512 Pain in left shoulder: Secondary | ICD-10-CM

## 2023-11-17 DIAGNOSIS — E1142 Type 2 diabetes mellitus with diabetic polyneuropathy: Secondary | ICD-10-CM | POA: Diagnosis not present

## 2023-11-17 DIAGNOSIS — G629 Polyneuropathy, unspecified: Secondary | ICD-10-CM | POA: Diagnosis not present

## 2023-11-17 DIAGNOSIS — M25521 Pain in right elbow: Secondary | ICD-10-CM

## 2023-11-17 LAB — POCT GLYCOSYLATED HEMOGLOBIN (HGB A1C): HbA1c, POC (controlled diabetic range): 5.6 % (ref 0.0–7.0)

## 2023-11-17 MED ORDER — ONDANSETRON 4 MG PO TBDP: 4.0000 mg | ORAL_TABLET | Freq: Three times a day (TID) | ORAL | 1 refills | Status: DC | PRN

## 2023-11-17 MED ORDER — EMPAGLIFLOZIN 10 MG PO TABS: 10.0000 mg | ORAL_TABLET | Freq: Every day | ORAL | 3 refills | Status: AC

## 2023-11-17 NOTE — Assessment & Plan Note (Signed)
 Refilled Jardiance  today. -Check A1c

## 2023-11-17 NOTE — Progress Notes (Signed)
    SUBJECTIVE:   CHIEF COMPLAINT / HPI:   "2 hard lumps on shoulder" These have been present for several weeks, very painful.  Reports decreased range of motion.  Trouble sleeping at night due to the discomfort.  She is left-handed and this impacts her daily tasks.  No injuries to the shoulder that she knows of.  Painful lump on right elbow This is also been present for several weeks, very painful.  She does have a cat, but then clarifies that she did not have the cat at the time that the lump appeared.  No recent illnesses, bug bites, new medications.  No injuries to the elbow that she knows of.  PERTINENT  PMH / PSH:  HTN, T2DM, hypothyroid/Graves' disease, HLD, hepatic steatosis, COPD, CVA, CKD 3, BMI 42, depression, anxiety, fibromyalgia   OBJECTIVE:   BP (!) 143/95   Pulse 84   Ht 5\' 6"  (1.676 m)   Wt 265 lb 9.6 oz (120.5 kg)   SpO2 99%   BMI 42.87 kg/m    General: anxious-appearing, no acute distress. HEENT: normocephalic Cardio: Regular rate, regular rhythm, no murmurs on exam. Pulm: No increased work of breathing. Extremities: Left shoulder with significant tenderness to palpation.  Mild swelling around the [facility] joint.  No erythema or lesions to the skin.  Right medial elbow with some swelling, tenderness to palpation, however no induration.   ASSESSMENT/PLAN:   Assessment & Plan Elbow pain, right Initially suspected swelling.  Node/cat scratch, though the timeline does not line up.  Performed point-of-care ultrasound under the guidance of Dr. Alverna John, and this does appear to be more of a medial epicondylitis picture.  No indication for antibiotics at this time. -Tylenol  650 mg Q8 for pain -Patient may obtain compression sleeve from pharmacy -Advised to use ice to reduce swelling/inflammation Acute pain of left shoulder Suspect possible rotator cuff pathology given that range of motion is impacted. - Will obtain shoulder x-ray for further workup Type 2 diabetes  mellitus with peripheral neuropathy (HCC) Refilled Jardiance  today. -Check A1c Stage 3 chronic kidney disease, unspecified whether stage 3a or 3b CKD (HCC) Patient due for labs - Will obtain CBC, BMP Encounter for immunization Patient given COVID-vaccine, pneumococcal vaccine.   Also of note, patient is very concerned mentation and reports that she has memory problems.  She questions if her gabapentin  could be responsible.  Scheduled her a follow-up appointment with PCP Dr. Telford Feather in 3 weeks to discuss her medication list (hydroxyzine  may be culprit?) and consider neurology follow-up for cognitive testing.  Omar Bibber, DO [facility]

## 2023-11-17 NOTE — Assessment & Plan Note (Signed)
 Patient due for labs - Will obtain CBC, BMP

## 2023-11-17 NOTE — Patient Instructions (Addendum)
 Dear Beverely Buba  Today we discussed the following concerns and plans:  Swelling, pain to right elbow - I suspect this is a result of injury or overuse of your right arm - you can try a compression sleeve (can purchase at your pharmacy) - use ice packs for 15 minutes 2-3 times daily - you may also take tylenol  650 mg every 8 hours  Shoulder pain - An x-ray was ordered for you---you do not need an appointment to have this completed. I recommend going to [facility] [address] [place]  You are getting some lab work today. I will let you know the results.  If you have any concerns, please call the clinic or schedule an appointment.  It was a pleasure to take care of you today. Be well!  Omar Bibber, DO [facility], PGY-1

## 2023-11-18 ENCOUNTER — Telehealth: Payer: Self-pay

## 2023-11-18 LAB — CBC
Hematocrit: 40 % (ref 34.0–46.6)
Hemoglobin: 12.5 g/dL (ref 11.1–15.9)
MCH: 27.5 pg (ref 26.6–33.0)
MCHC: 31.3 g/dL — ABNORMAL LOW (ref 31.5–35.7)
MCV: 88 fL (ref 79–97)
Platelets: 361 10*3/uL (ref 150–450)
RBC: 4.55 x10E6/uL (ref 3.77–5.28)
RDW: 13.9 % (ref 11.7–15.4)
WBC: 7.4 10*3/uL (ref 3.4–10.8)

## 2023-11-18 LAB — BASIC METABOLIC PANEL WITH GFR
BUN/Creatinine Ratio: 8 — ABNORMAL LOW (ref 12–28)
BUN: 7 mg/dL — ABNORMAL LOW (ref 8–27)
CO2: 20 mmol/L (ref 20–29)
Calcium: 8.9 mg/dL (ref 8.7–10.3)
Chloride: 107 mmol/L — ABNORMAL HIGH (ref 96–106)
Creatinine, Ser: 0.9 mg/dL (ref 0.57–1.00)
Glucose: 96 mg/dL (ref 70–99)
Potassium: 4 mmol/L (ref 3.5–5.2)
Sodium: 144 mmol/L (ref 134–144)
eGFR: 72 mL/min/{1.73_m2} (ref >59)

## 2023-11-18 NOTE — Telephone Encounter (Signed)
 Pharmacy Patient Advocate Encounter   Received notification from CoverMyMeds that prior authorization for JARDIANCE  is required/requested.   Insurance verification completed.   The patient is insured through [facility] .   PA required; PA submitted to above mentioned insurance via CoverMyMeds Key/confirmation #/EOC [id number]. Status is pending   Attached lab results (CBC and BMP)

## 2023-11-18 NOTE — Telephone Encounter (Signed)
 Pharmacy Patient Advocate Encounter  Received notification from [facility] that Prior Authorization for jardiance  has been APPROVED from 11/18/23 to 11/17/24   PA #/Case ID/Reference #: [id number]

## 2023-11-22 ENCOUNTER — Other Ambulatory Visit: Payer: Self-pay | Admitting: Family Medicine

## 2023-11-22 ENCOUNTER — Ambulatory Visit: Payer: Self-pay | Admitting: Family Medicine

## 2023-11-22 DIAGNOSIS — I639 Cerebral infarction, unspecified: Secondary | ICD-10-CM

## 2023-11-22 DIAGNOSIS — E785 Hyperlipidemia, unspecified: Secondary | ICD-10-CM

## 2023-12-07 ENCOUNTER — Other Ambulatory Visit: Payer: Self-pay | Admitting: Family Medicine

## 2023-12-08 ENCOUNTER — Ambulatory Visit: Payer: Self-pay | Admitting: Student

## 2024-01-04 ENCOUNTER — Other Ambulatory Visit: Payer: Self-pay | Admitting: Nurse Practitioner

## 2024-01-04 ENCOUNTER — Other Ambulatory Visit: Payer: Self-pay | Admitting: Student

## 2024-01-04 DIAGNOSIS — R197 Diarrhea, unspecified: Secondary | ICD-10-CM

## 2024-01-18 ENCOUNTER — Other Ambulatory Visit: Payer: Self-pay | Admitting: Gastroenterology

## 2024-02-20 ENCOUNTER — Other Ambulatory Visit: Payer: Self-pay | Admitting: Student

## 2024-02-28 ENCOUNTER — Ambulatory Visit: Admitting: Student

## 2024-03-16 ENCOUNTER — Encounter: Payer: Self-pay | Admitting: Student

## 2024-03-16 ENCOUNTER — Ambulatory Visit: Admitting: Student

## 2024-03-16 VITALS — BP 172/104 | HR 96 | Ht 66.0 in | Wt 270.6 lb

## 2024-03-16 DIAGNOSIS — G4700 Insomnia, unspecified: Secondary | ICD-10-CM | POA: Diagnosis not present

## 2024-03-16 DIAGNOSIS — E0500 Thyrotoxicosis with diffuse goiter without thyrotoxic crisis or storm: Secondary | ICD-10-CM | POA: Diagnosis not present

## 2024-03-16 DIAGNOSIS — I152 Hypertension secondary to endocrine disorders: Secondary | ICD-10-CM

## 2024-03-16 DIAGNOSIS — M25512 Pain in left shoulder: Secondary | ICD-10-CM | POA: Diagnosis not present

## 2024-03-16 DIAGNOSIS — E1159 Type 2 diabetes mellitus with other circulatory complications: Secondary | ICD-10-CM | POA: Diagnosis not present

## 2024-03-16 DIAGNOSIS — E1142 Type 2 diabetes mellitus with diabetic polyneuropathy: Secondary | ICD-10-CM | POA: Diagnosis not present

## 2024-03-16 DIAGNOSIS — Z23 Encounter for immunization: Secondary | ICD-10-CM

## 2024-03-16 MED ORDER — TRAZODONE HCL 50 MG PO TABS: 50.0000 mg | ORAL_TABLET | Freq: Every day | ORAL | 3 refills | Status: DC

## 2024-03-16 MED ORDER — AMLODIPINE-OLMESARTAN 5-20 MG PO TABS: 1.0000 | ORAL_TABLET | Freq: Every day | ORAL | 1 refills | Status: DC

## 2024-03-16 NOTE — Assessment & Plan Note (Signed)
 Poorly controlled.  Not currently on medication.  Recent BMP on 11/17/2023 with adequate renal function and no electro abnormalities.  Would benefit from renal protection. - Start amlodipine -olmesartan  5-20 mg daily - Follow-up in 2 weeks

## 2024-03-16 NOTE — Assessment & Plan Note (Signed)
 Has not seen endocrinology, nor updated thyroid  studies for at least 3 years. - TSH reflex T4 today - Referral to endocrinology placed

## 2024-03-16 NOTE — Assessment & Plan Note (Signed)
 A1c 5.64 months ago - Due for UACR today - Continue Jardiance  - Continue gabapentin  for neuropathy

## 2024-03-16 NOTE — Progress Notes (Signed)
    SUBJECTIVE:   CHIEF COMPLAINT / HPI:   Hypertension  diabetes Uncontrolled hypertension, no medications.  Associated with type 2 diabetes.  CKD on problem list, however most recent BMP with eGFR of greater than 70 and normal creatinine.  She is asymptomatic.  Recent A1c was well-controlled, but needs UACR today.  Graves' disease Patient reports that she think she is taking methimazole , but do not see a refill of this since 2022.  She has not had a follow-up with endocrinology in several years, nor thyroid  studies.  Acute shoulder pain Previously seen last month for shoulder pain.  Denies injury.  She has had no improvement.  She did not obtain x-ray.   OBJECTIVE:   BP (!) 172/104   Pulse 96   Ht 5' 6 (1.676 m)   Wt 270 lb 9.6 oz (122.7 kg)   SpO2 98%   BMI 43.68 kg/m    General: NAD, pleasant Cardio: RRR, no MRG. Respiratory: CTAB, normal wob on RA GI: Abdomen is soft, not tender, not distended. BS present Skin: Warm and dry  ASSESSMENT/PLAN:   Assessment & Plan Graves disease Has not seen endocrinology, nor updated thyroid  studies for at least 3 years. - TSH reflex T4 today - Referral to endocrinology placed Type 2 diabetes mellitus with peripheral neuropathy (HCC) A1c 5.64 months ago - Due for UACR today - Continue Jardiance  - Continue gabapentin  for neuropathy Hypertension associated with diabetes (HCC) Poorly controlled.  Not currently on medication.  Recent BMP on 11/17/2023 with adequate renal function and no electro abnormalities.  Would benefit from renal protection. - Start amlodipine -olmesartan  5-20 mg daily - Follow-up in 2 weeks Acute pain of left shoulder Differential: Rotator cuff tear, arthritis, adhesive capsulitis - Obtain plain films - Referral to physical therapy Insomnia, unspecified type Has sleep disturbance, history of insomnia?  Alternating between hydroxyzine  and trazodone , discussed discontinue hydroxyzine  today.  Refill trazodone   today.  She will follow-up to discuss further.  Follow-up conditions Follow-up about chronic bilateral knee pain and handicap placard at later date  Gladis Church, DO [facility]

## 2024-03-16 NOTE — Patient Instructions (Addendum)
 It was great to see you! Thank you for allowing me to participate in your care!   I recommend that you always bring your medications to each appointment as this makes it easy to ensure we are on the correct medications and helps us  not miss when refills are needed.  Our plans for today:  - Make an appointment to discuss knee pain - Make an appointment to discuss blood pressure and Grave's disease - Make an appointment follow-up shoulder pain -We are checking some labs today, I will call you if they are abnormal will send you a MyChart message or a letter if they are normal.  If you do not hear about your labs in the next 2 weeks please let us  know. - I have sent referral for endocrinology, they will call you for appointment  Take care and seek immediate care sooner if you develop any concerns. Please remember to show up 15 minutes before your scheduled appointment time!  Gladis Church, DO [facility]

## 2024-03-17 LAB — MICROALBUMIN / CREATININE URINE RATIO
Creatinine, Urine: 191.9 mg/dL
Microalb/Creat Ratio: 82 mg/g{creat} — ABNORMAL HIGH (ref 0–29)
Microalbumin, Urine: 157.7 ug/mL

## 2024-03-17 LAB — TSH RFX ON ABNORMAL TO FREE T4: TSH: 3.18 u[IU]/mL (ref 0.450–4.500)

## 2024-03-19 ENCOUNTER — Ambulatory Visit: Payer: Self-pay | Admitting: Student

## 2024-03-26 ENCOUNTER — Telehealth: Payer: Self-pay

## 2024-03-26 NOTE — Telephone Encounter (Signed)
 Patient calls nurse line in regards to Trazodone .   She reports this medication is not working for her anymore. She reports she would like to switch over to Hydroxyzine .   She reports this was discussed at her last PCP visit.  If appropriate to switch, I can call [facility] and cancel the remaining refills of Trazodone .   Will forward to PCP.

## 2024-03-30 NOTE — Telephone Encounter (Signed)
 Trazodone  not helping with insomnia. Previously on hydroxyzine , which helped. Recommend appointment to discuss further before restarting hydroxyzine .

## 2024-04-02 NOTE — Telephone Encounter (Signed)
 Patient is coming in on 10/7 @310  pm

## 2024-04-05 ENCOUNTER — Other Ambulatory Visit: Payer: Self-pay | Admitting: Nurse Practitioner

## 2024-04-05 ENCOUNTER — Ambulatory Visit
Admission: RE | Admit: 2024-04-05 | Discharge: 2024-04-05 | Disposition: A | Discharge status: Home / Self Care | Source: Ambulatory Visit | Attending: Family Medicine | Admitting: Family Medicine

## 2024-04-05 DIAGNOSIS — R197 Diarrhea, unspecified: Secondary | ICD-10-CM

## 2024-04-05 DIAGNOSIS — M25512 Pain in left shoulder: Secondary | ICD-10-CM

## 2024-04-12 ENCOUNTER — Other Ambulatory Visit: Payer: Self-pay | Admitting: Student

## 2024-04-12 DIAGNOSIS — E1159 Type 2 diabetes mellitus with other circulatory complications: Secondary | ICD-10-CM
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
## 2024-04-13 ENCOUNTER — Ambulatory Visit

## 2024-04-17 ENCOUNTER — Encounter: Payer: Self-pay | Admitting: Student

## 2024-04-17 ENCOUNTER — Ambulatory Visit: Admitting: Student

## 2024-04-17 ENCOUNTER — Telehealth: Payer: Self-pay | Admitting: Student

## 2024-04-17 VITALS — BP 120/95 | HR 103 | Ht 66.0 in | Wt 276.0 lb

## 2024-04-17 DIAGNOSIS — F5101 Primary insomnia: Secondary | ICD-10-CM

## 2024-04-17 DIAGNOSIS — M1712 Unilateral primary osteoarthritis, left knee: Secondary | ICD-10-CM

## 2024-04-17 DIAGNOSIS — E1159 Type 2 diabetes mellitus with other circulatory complications: Secondary | ICD-10-CM | POA: Diagnosis not present

## 2024-04-17 DIAGNOSIS — I152 Hypertension secondary to endocrine disorders: Secondary | ICD-10-CM | POA: Diagnosis not present

## 2024-04-17 MED ORDER — DOXEPIN HCL 3 MG PO TABS: 3.0000 mg | ORAL_TABLET | Freq: Every evening | ORAL | 0 refills | Status: DC

## 2024-04-17 NOTE — Patient Instructions (Addendum)
  It was great to see you! Thank you for allowing me to participate in your care!   I recommend that you always bring your medications to each appointment as this makes it easy to ensure we are on the correct medications and helps us  not miss when refills are needed.  Our plans for today:  - Take 3 mg of Doxepin 30 minutes before you want to sleep - Keep your room dark, and cool - Avoid screens atleast 1 hour before bed time - Try listening to calming music or sounds - You can also read, but do not use a screen - I have sent a referral to orthopedics, they will call you to schedule  Take care and seek immediate care sooner if you develop any concerns. Please remember to show up 15 minutes before your scheduled appointment time!  Gladis Church, DO [facility]   [facility] [address] P [phone] F [phone]
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
## 2024-04-17 NOTE — Assessment & Plan Note (Signed)
 Elevated Diastolics.  Recently started amlodipine -olmesartan  5-20 mg daily, patient reports adherence.  Significantly improved systolic blood pressures with this regimen. - recommend follow-up in 2 weeks to recheck

## 2024-04-17 NOTE — Telephone Encounter (Signed)
 Placed a form in your box that the patient needs completed.

## 2024-04-17 NOTE — Telephone Encounter (Signed)
 Patient dropped off form at front desk for [facility].  Verified that patient section of form has been completed.  Last DOS/WCC with PCP was 04/17/2024.  Placed form in [place] team folder to be completed by clinical staff.  Yesenia [name]

## 2024-04-18 NOTE — Telephone Encounter (Signed)
 Form completed, placed in RN triage.

## 2024-04-18 NOTE — Telephone Encounter (Signed)
 Patient called and informed that forms are ready for pick up. Copy made and placed in batch scanning. Original placed at front desk for pick up.   Veronda Prude, RN

## 2024-04-19 ENCOUNTER — Telehealth: Payer: Self-pay

## 2024-04-19 ENCOUNTER — Other Ambulatory Visit (HOSPITAL_COMMUNITY): Payer: Self-pay

## 2024-04-19 DIAGNOSIS — G479 Sleep disorder, unspecified: Secondary | ICD-10-CM

## 2024-04-19 MED ORDER — DOXEPIN HCL 3 MG PO TABS: 3.0000 mg | ORAL_TABLET | Freq: Every evening | ORAL | 1 refills | Status: DC

## 2024-04-19 NOTE — Telephone Encounter (Signed)
 Called patient, discussed prior Auth for doxepin.  - Confirmed patient has failed trial with ramelteon  - Additionally she has failed hydroxyzine  and trazodone  -Given her severe sleep apnea, I am hesitant to prescribe her stronger hypnotic sedative such as Ambien  (although this is on formulary). - Ultimately I think doxepin would give her the best benefit, with the least amount of side effects.  Recommend we continue with prior Auth so that she can have doxepin.

## 2024-04-19 NOTE — Telephone Encounter (Signed)
 Rec'd PA request for patients Doxepin.  Per test claim: Product not on formulary  Preferred products:

## 2024-04-20 ENCOUNTER — Other Ambulatory Visit (HOSPITAL_COMMUNITY): Payer: Self-pay

## 2024-04-20 NOTE — Telephone Encounter (Signed)
 Pharmacy Patient Advocate Encounter  Received notification from [facility] MEDICAID that Prior Authorization for DOXEPIN 3MG  has been APPROVED from 04/20/24 to 10/19/24   PA #/Case ID/Reference #: [id number]  CAROLEEN!

## 2024-04-20 NOTE — Telephone Encounter (Signed)
 Prior authorization submitted for DOXEPIN 3MG  to [employer] via Latent.   Key: [id number]

## 2024-04-25 ENCOUNTER — Ambulatory Visit

## 2024-05-01 ENCOUNTER — Telehealth: Payer: Self-pay

## 2024-05-01 DIAGNOSIS — F5101 Primary insomnia: Secondary | ICD-10-CM

## 2024-05-01 DIAGNOSIS — F339 Major depressive disorder, recurrent, unspecified: Secondary | ICD-10-CM

## 2024-05-01 NOTE — Telephone Encounter (Signed)
 Patient calls nurse line in regards to Doxepin.  She reports she has been taking this medication for sleep since 10/7. However, she reports continued sleeping problems. She reports she has taken (1) and (2) at a time with no success.   She reports she has been practicing good sleep hygiene.  Advised will forward to PCP.

## 2024-05-03 NOTE — Telephone Encounter (Signed)
 Patient returns call to nurse line to check status of previous message.   Patient called and asked pharmacy if new medication was sent in. They advised that they had a rx for Ramelteon  for sleep management.   Patient reports that she has tried this in the past and this does not work for her. Advised her that Dr. Howell has not sent over any new prescriptions. If possible, she is asking that new rx for sleep be sent to pharmacy prior to Tuesday, as pharmacy only delivers on Tuesday and Thursday.   Will forward to PCP.   Chiquita JAYSON English, RN

## 2024-05-07 ENCOUNTER — Other Ambulatory Visit: Payer: Self-pay | Admitting: Student

## 2024-05-10 ENCOUNTER — Other Ambulatory Visit: Payer: Self-pay | Admitting: Student

## 2024-05-10 MED ORDER — QUETIAPINE FUMARATE 50 MG PO TABS: 50.0000 mg | ORAL_TABLET | Freq: Every day | ORAL | 1 refills | Status: DC

## 2024-05-10 NOTE — Addendum Note (Signed)
 Addended by: HOWELL LUNGER on: 05/10/2024 12:22 PM   Modules accepted: Orders

## 2024-05-14 ENCOUNTER — Encounter: Payer: Self-pay | Admitting: Radiology

## 2024-05-29 ENCOUNTER — Ambulatory Visit: Admitting: Student

## 2024-06-05 ENCOUNTER — Ambulatory Visit: Admitting: Student

## 2024-06-05 ENCOUNTER — Encounter: Payer: Self-pay | Admitting: Student

## 2024-06-05 VITALS — BP 142/92 | HR 95 | Ht 66.0 in | Wt 292.2 lb

## 2024-06-05 DIAGNOSIS — F5101 Primary insomnia: Secondary | ICD-10-CM

## 2024-06-05 DIAGNOSIS — E1142 Type 2 diabetes mellitus with diabetic polyneuropathy: Secondary | ICD-10-CM

## 2024-06-05 DIAGNOSIS — M1712 Unilateral primary osteoarthritis, left knee: Secondary | ICD-10-CM

## 2024-06-05 DIAGNOSIS — F339 Major depressive disorder, recurrent, unspecified: Secondary | ICD-10-CM

## 2024-06-05 DIAGNOSIS — M25571 Pain in right ankle and joints of right foot: Secondary | ICD-10-CM

## 2024-06-05 DIAGNOSIS — Z23 Encounter for immunization: Secondary | ICD-10-CM

## 2024-06-05 DIAGNOSIS — G4700 Insomnia, unspecified: Secondary | ICD-10-CM

## 2024-06-05 LAB — POCT GLYCOSYLATED HEMOGLOBIN (HGB A1C): HbA1c, POC (controlled diabetic range): 6.1 % (ref 0.0–7.0)

## 2024-06-05 MED ORDER — QUETIAPINE FUMARATE 50 MG PO TABS: 100.0000 mg | ORAL_TABLET | Freq: Every day | ORAL | 1 refills | Status: DC
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
## 2024-06-05 NOTE — Patient Instructions (Addendum)
 It was great to see you! Thank you for allowing me to participate in your care!   I recommend that you always bring your medications to each appointment as this makes it easy to ensure we are on the correct medications and helps us  not miss when refills are needed.  Our plans for today:  -Please follow-up in 2 weeks to discuss ongoing elevated blood pressure - Please see below resources for psychiatry -Please follow-up with orthopedic doctor and sleep medicine next month -I have placed a DME order for shower chair - Take 2 tablets (100 mg total) of Seroquel  nightly, you can then increase to 3 tablets (100 mg total) after 3 days if you are not having benefit with 100 mg -You can purchase an ankle brace, wear this while walking and when sleeping   -An x-ray was ordered for you---you do not need an appointment to have this completed.  I recommend going to [facility] [address] [place]  If the results are normal,I will send you a letter  I will call you with results if anything is abnormal     Psychiatry Resource List (Adults and Children) Most of these providers will take Medicaid. please consult your insurance for a complete and updated list of available providers. When calling to make an appointment have your insurance information available to confirm you are covered.   [facility]:Psychiatry and Counseling [address] [place], [place] [address] [phone]  [facility]  [address] [place], [place] [phone] Crisis [phone]   [facility]:   [facility]: [address]     [phone]   [place]: [address] [place]. [place],        [phone] : [address] Suite [phone],    [phone] [place]: [phone] [address],                   [phone] Children: [facility] [address]          [phone]  [facility] (virtual only) [phone]   [facility]  (Psychiatry only; Adults /children 12 and over, will take Medicaid)  [address] [place] [address], [place], [place] [address]       [phone]   [facility] (Psychiatry & counseling ; adults & children ; will take Medicaid [address]  [place] [address]  Go on-line to complete referral ( [email or web address] [phone]    (Spanish speaking therapists)  [facility]  Psychiatry & counseling; Adults and children;  Call Registration prior to scheduling an appointment [phone] [address]    [place], [place] [address]    [phone]  [facility] (Psychiatry & counseling; adults & children; Medicare no Medicaid)  [address]   [place], [place]  [address]      [phone]    [facility] (up to age 40)  Psychiatry & counseling ,will take Medicaid, must do counseling to receive psychiatry services  [address] [place] [address]        [phone]  [facility] (Psychiatry & counseling; adults & children; will take Medicaid) Will need a referral from provider [address],  [place], [place]  [phone]   [facility] --- Walk-In Mon-Friday 8am-3pm ( will take Medicaid, Psychiatry, Adults & children,  [address] [place], [place]   [phone]   [facility]--, Walk-in M-F 8am-12pm and 1pm -3pm   (Counseling, Psychiatry, will take Medicaid, adults & children)  [address], [place], [place]  [phone]     Take care and seek immediate care sooner if you develop any concerns. Please remember to show up 15 minutes before your scheduled appointment time!  Gladis Church, DO [facility]

## 2024-06-05 NOTE — Assessment & Plan Note (Signed)
-   Well-controlled - Continue Jardiance  milligram daily

## 2024-06-06 ENCOUNTER — Ambulatory Visit: Admission: RE | Admit: 2024-06-06 | Discharge: 2024-06-06 | Disposition: A | Source: Ambulatory Visit

## 2024-06-06 ENCOUNTER — Other Ambulatory Visit: Payer: Self-pay | Admitting: Student

## 2024-06-06 DIAGNOSIS — M25571 Pain in right ankle and joints of right foot: Secondary | ICD-10-CM | POA: Diagnosis not present

## 2024-06-06 DIAGNOSIS — M7989 Other specified soft tissue disorders: Secondary | ICD-10-CM | POA: Diagnosis not present

## 2024-06-06 DIAGNOSIS — I152 Hypertension secondary to endocrine disorders: Secondary | ICD-10-CM

## 2024-06-13 ENCOUNTER — Other Ambulatory Visit: Payer: Self-pay

## 2024-06-13 ENCOUNTER — Ambulatory Visit: Admitting: Physician Assistant

## 2024-06-13 ENCOUNTER — Encounter: Payer: Self-pay | Admitting: Physician Assistant

## 2024-06-13 VITALS — Ht 66.0 in | Wt 292.3 lb

## 2024-06-13 DIAGNOSIS — G8929 Other chronic pain: Secondary | ICD-10-CM

## 2024-06-13 DIAGNOSIS — M25562 Pain in left knee: Secondary | ICD-10-CM | POA: Diagnosis not present

## 2024-06-13 DIAGNOSIS — M25561 Pain in right knee: Secondary | ICD-10-CM
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
## 2024-06-18 ENCOUNTER — Ambulatory Visit: Payer: Self-pay | Admitting: Student

## 2024-06-26 ENCOUNTER — Encounter: Payer: Self-pay | Admitting: Neurology

## 2024-06-26 ENCOUNTER — Ambulatory Visit: Admitting: Neurology

## 2024-06-26 VITALS — BP 143/93 | HR 98 | Ht 66.0 in | Wt 290.0 lb

## 2024-06-26 DIAGNOSIS — Z9189 Other specified personal risk factors, not elsewhere classified: Secondary | ICD-10-CM | POA: Diagnosis not present

## 2024-06-26 DIAGNOSIS — Z8669 Personal history of other diseases of the nervous system and sense organs: Secondary | ICD-10-CM

## 2024-06-26 DIAGNOSIS — G4700 Insomnia, unspecified: Secondary | ICD-10-CM | POA: Diagnosis not present

## 2024-06-26 DIAGNOSIS — R351 Nocturia: Secondary | ICD-10-CM

## 2024-06-26 DIAGNOSIS — Z6841 Body Mass Index (BMI) 40.0 and over, adult: Secondary | ICD-10-CM | POA: Diagnosis not present

## 2024-06-26 NOTE — Patient Instructions (Addendum)
 Your chronic insomnia may respond to cognitive behavioral therapy (CBT-I). CBT-I stands for cognitive behavioral treatment for insomnia. The cognitive part teaches you to recognize and change beliefs that can adversely affect your ability to sleep. The behavioral part helps you develop positive sleep habits and teaches you to avoid behaviors that keep you from sleeping or sleeping well. This type of therapy can help you control or eliminate negative thoughts and worries that keep you awake at night. This treatment takes time and patience and participation from your side, and willingness to make changes, but can be very effective and long-lasting.  Please talk to your primary care about a referral to psychology for this.  You may benefit from seeing a psychiatrist for intermittent depressive symptoms.  Untreated and suboptimally treated depression can cause difficulty sleeping as well.  Not sleeping enough or not sleeping well enough can in turn exacerbate depressive symptoms.  As discussed, we will proceed with a home sleep test at this time to see if you have evidence of obstructive sleep apnea.  If you have obstructive sleep apnea, I will likely offer you treatment with a CPAP or AutoPap machine.
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
## 2024-06-28 ENCOUNTER — Other Ambulatory Visit: Payer: Self-pay | Admitting: Nurse Practitioner

## 2024-06-28 DIAGNOSIS — R197 Diarrhea, unspecified: Secondary | ICD-10-CM

## 2024-06-28 MED ORDER — QUETIAPINE FUMARATE 50 MG PO TABS: 150.0000 mg | ORAL_TABLET | Freq: Every day | ORAL | 1 refills | Status: DC

## 2024-06-28 NOTE — Addendum Note (Signed)
 Addended by: HOWELL LUNGER on: 06/28/2024 09:42 AM   Modules accepted: Orders

## 2024-07-02 ENCOUNTER — Other Ambulatory Visit (HOSPITAL_COMMUNITY): Payer: Self-pay

## 2024-07-02 NOTE — Telephone Encounter (Signed)
 A user error has taken place: encounter opened in error, closed for administrative reasons.

## 2024-07-03 ENCOUNTER — Other Ambulatory Visit: Payer: Self-pay | Admitting: Student

## 2024-07-03 ENCOUNTER — Other Ambulatory Visit: Payer: Self-pay

## 2024-07-03 ENCOUNTER — Telehealth: Payer: Self-pay | Admitting: Neurology

## 2024-07-03 NOTE — Telephone Encounter (Signed)
 HST [facility] [employer] pending

## 2024-07-16 NOTE — Telephone Encounter (Signed)
 HST [facility] [employer] no auth req via fax

## 2024-07-16 NOTE — Telephone Encounter (Signed)
 HST still pending

## 2024-07-30 ENCOUNTER — Other Ambulatory Visit: Payer: Self-pay | Admitting: Student

## 2024-07-30 DIAGNOSIS — F339 Major depressive disorder, recurrent, unspecified: Secondary | ICD-10-CM

## 2024-07-30 DIAGNOSIS — F5101 Primary insomnia: Secondary | ICD-10-CM

## 2024-07-30 DIAGNOSIS — G479 Sleep disorder, unspecified: Secondary | ICD-10-CM

## 2024-07-31 ENCOUNTER — Ambulatory Visit: Admitting: Endocrinology

## 2024-08-02 ENCOUNTER — Telehealth: Payer: Self-pay

## 2024-08-02 ENCOUNTER — Ambulatory Visit: Admitting: Endocrinology

## 2024-08-02 DIAGNOSIS — M1712 Unilateral primary osteoarthritis, left knee: Secondary | ICD-10-CM

## 2024-08-02 NOTE — Telephone Encounter (Signed)
 Patient calls nurse line regarding shower chair.   She states that she discussed this at visit with provider in November.   I am unable to find active order within Epic.   Will forward to Dr. Howell.   Chiquita JAYSON English, RN

## 2024-08-07 NOTE — Telephone Encounter (Signed)
 Community message sent to [employer] for shower chair.   Chiquita JAYSON English, RN

## 2024-08-13 ENCOUNTER — Ambulatory Visit: Payer: Self-pay | Admitting: Student

## 2024-11-20 ENCOUNTER — Ambulatory Visit: Admitting: Endocrinology
